# Patient Record
Sex: Female | Born: 1945 | Race: White | Hispanic: No | Marital: Single | Smoking: Never smoker
Health system: Southern US, Community
[De-identification: ages and names within clinical notes are randomized; demographics above are authoritative.]

## PROBLEM LIST (undated history)

## (undated) DIAGNOSIS — R9430 Abnormal result of cardiovascular function study, unspecified: Secondary | ICD-10-CM

## (undated) DIAGNOSIS — I428 Other cardiomyopathies: Secondary | ICD-10-CM

## (undated) DIAGNOSIS — IMO0002 Reserved for concepts with insufficient information to code with codable children: Secondary | ICD-10-CM

## (undated) DIAGNOSIS — D469 Myelodysplastic syndrome, unspecified: Secondary | ICD-10-CM

## (undated) DIAGNOSIS — D539 Nutritional anemia, unspecified: Secondary | ICD-10-CM

## (undated) DIAGNOSIS — E785 Hyperlipidemia, unspecified: Secondary | ICD-10-CM

## (undated) DIAGNOSIS — E039 Hypothyroidism, unspecified: Secondary | ICD-10-CM

## (undated) DIAGNOSIS — Z95810 Presence of automatic (implantable) cardiac defibrillator: Secondary | ICD-10-CM

## (undated) DIAGNOSIS — E663 Overweight: Secondary | ICD-10-CM

## (undated) DIAGNOSIS — K5790 Diverticulosis of intestine, part unspecified, without perforation or abscess without bleeding: Secondary | ICD-10-CM

## (undated) DIAGNOSIS — K219 Gastro-esophageal reflux disease without esophagitis: Secondary | ICD-10-CM

## (undated) DIAGNOSIS — I340 Nonrheumatic mitral (valve) insufficiency: Secondary | ICD-10-CM

## (undated) DIAGNOSIS — I447 Left bundle-branch block, unspecified: Secondary | ICD-10-CM

## (undated) DIAGNOSIS — E611 Iron deficiency: Secondary | ICD-10-CM

## (undated) DIAGNOSIS — H269 Unspecified cataract: Secondary | ICD-10-CM

## (undated) DIAGNOSIS — I5022 Chronic systolic (congestive) heart failure: Secondary | ICD-10-CM

## (undated) DIAGNOSIS — K635 Polyp of colon: Secondary | ICD-10-CM

## (undated) HISTORY — DX: Chronic systolic (congestive) heart failure: I50.22

## (undated) HISTORY — DX: Unspecified cataract: H26.9

## (undated) HISTORY — PX: CARDIAC CATHETERIZATION: SHX172

## (undated) HISTORY — DX: Gastro-esophageal reflux disease without esophagitis: K21.9

## (undated) HISTORY — DX: Hyperlipidemia, unspecified: E78.5

## (undated) HISTORY — DX: Overweight: E66.3

## (undated) HISTORY — DX: Other cardiomyopathies: I42.8

## (undated) HISTORY — PX: CARDIAC DEFIBRILLATOR PLACEMENT: SHX171

## (undated) HISTORY — DX: Left bundle-branch block, unspecified: I44.7

## (undated) HISTORY — DX: Abnormal result of cardiovascular function study, unspecified: R94.30

## (undated) HISTORY — DX: Presence of automatic (implantable) cardiac defibrillator: Z95.810

## (undated) HISTORY — DX: Myelodysplastic syndrome, unspecified: D46.9

## (undated) HISTORY — DX: Diverticulosis of intestine, part unspecified, without perforation or abscess without bleeding: K57.90

## (undated) HISTORY — DX: Hypothyroidism, unspecified: E03.9

## (undated) HISTORY — PX: EYE SURGERY: SHX253

## (undated) HISTORY — DX: Nutritional anemia, unspecified: D53.9

## (undated) HISTORY — DX: Polyp of colon: K63.5

## (undated) HISTORY — PX: OTHER SURGICAL HISTORY: SHX169

## (undated) HISTORY — DX: Iron deficiency: E61.1

## (undated) HISTORY — DX: Nonrheumatic mitral (valve) insufficiency: I34.0

## (undated) HISTORY — DX: Reserved for concepts with insufficient information to code with codable children: IMO0002

---
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
## 1997-11-05 ENCOUNTER — Encounter: Payer: Self-pay | Admitting: Cardiology

## 1998-12-08 ENCOUNTER — Other Ambulatory Visit: Admission: RE | Admit: 1998-12-08 | Discharge: 1998-12-08 | Payer: Self-pay | Admitting: Obstetrics and Gynecology

## 2000-01-04 ENCOUNTER — Other Ambulatory Visit: Admission: RE | Admit: 2000-01-04 | Discharge: 2000-01-04 | Payer: Self-pay | Admitting: Obstetrics and Gynecology

## 2000-07-06 ENCOUNTER — Encounter: Admission: RE | Admit: 2000-07-06 | Discharge: 2000-07-06 | Payer: Self-pay | Admitting: Internal Medicine

## 2000-07-06 ENCOUNTER — Encounter: Payer: Self-pay | Admitting: Internal Medicine

## 2001-04-20 ENCOUNTER — Other Ambulatory Visit: Admission: RE | Admit: 2001-04-20 | Discharge: 2001-04-20 | Payer: Self-pay | Admitting: Obstetrics and Gynecology

## 2001-04-24 ENCOUNTER — Encounter: Admission: RE | Admit: 2001-04-24 | Discharge: 2001-04-24 | Payer: Self-pay | Admitting: Obstetrics and Gynecology

## 2001-04-24 ENCOUNTER — Encounter: Payer: Self-pay | Admitting: Obstetrics and Gynecology

## 2003-08-16 ENCOUNTER — Encounter: Admission: RE | Admit: 2003-08-16 | Discharge: 2003-08-16 | Payer: Self-pay | Admitting: Obstetrics and Gynecology

## 2004-06-18 ENCOUNTER — Other Ambulatory Visit: Admission: RE | Admit: 2004-06-18 | Discharge: 2004-06-18 | Payer: Self-pay | Admitting: Obstetrics and Gynecology

## 2005-07-01 ENCOUNTER — Other Ambulatory Visit: Admission: RE | Admit: 2005-07-01 | Discharge: 2005-07-01 | Payer: Self-pay | Admitting: Obstetrics and Gynecology

## 2006-06-20 ENCOUNTER — Encounter: Admission: RE | Admit: 2006-06-20 | Discharge: 2006-09-18 | Payer: Self-pay | Admitting: Internal Medicine

## 2007-03-28 ENCOUNTER — Encounter: Admission: RE | Admit: 2007-03-28 | Discharge: 2007-03-28 | Payer: Self-pay | Admitting: Internal Medicine

## 2008-05-13 ENCOUNTER — Encounter: Admission: RE | Admit: 2008-05-13 | Discharge: 2008-05-13 | Payer: Self-pay | Admitting: Internal Medicine

## 2008-05-13 ENCOUNTER — Ambulatory Visit: Payer: Self-pay | Admitting: Cardiology

## 2008-05-13 ENCOUNTER — Inpatient Hospital Stay (HOSPITAL_COMMUNITY): Admission: AD | Admit: 2008-05-13 | Discharge: 2008-05-16 | Payer: Self-pay | Admitting: Internal Medicine

## 2008-05-14 ENCOUNTER — Encounter: Payer: Self-pay | Admitting: Internal Medicine

## 2008-05-22 ENCOUNTER — Ambulatory Visit: Payer: Self-pay | Admitting: Cardiology

## 2008-05-22 LAB — CONVERTED CEMR LAB
CO2: 31 meq/L (ref 19–32)
Calcium: 9.2 mg/dL (ref 8.4–10.5)
Potassium: 4.3 meq/L (ref 3.5–5.1)
Sodium: 142 meq/L (ref 135–145)

## 2008-05-24 ENCOUNTER — Ambulatory Visit: Payer: Self-pay | Admitting: Cardiology

## 2008-06-06 ENCOUNTER — Ambulatory Visit: Payer: Self-pay | Admitting: Cardiology

## 2008-06-24 ENCOUNTER — Ambulatory Visit: Payer: Self-pay | Admitting: Cardiology

## 2008-07-18 ENCOUNTER — Ambulatory Visit: Payer: Self-pay | Admitting: Cardiology

## 2008-08-20 ENCOUNTER — Ambulatory Visit: Payer: Self-pay | Admitting: Internal Medicine

## 2008-08-26 ENCOUNTER — Ambulatory Visit: Payer: Self-pay

## 2008-08-26 ENCOUNTER — Encounter: Payer: Self-pay | Admitting: Cardiology

## 2008-09-02 ENCOUNTER — Ambulatory Visit: Payer: Self-pay | Admitting: Cardiology

## 2008-09-23 ENCOUNTER — Ambulatory Visit: Payer: Self-pay | Admitting: Internal Medicine

## 2008-10-09 ENCOUNTER — Ambulatory Visit: Payer: Self-pay | Admitting: Cardiology

## 2008-10-22 ENCOUNTER — Ambulatory Visit: Payer: Self-pay | Admitting: Cardiology

## 2008-11-19 ENCOUNTER — Ambulatory Visit: Payer: Self-pay | Admitting: Internal Medicine

## 2008-12-02 ENCOUNTER — Emergency Department (HOSPITAL_COMMUNITY): Admission: EM | Admit: 2008-12-02 | Discharge: 2008-12-02 | Payer: Self-pay | Admitting: Emergency Medicine

## 2008-12-02 ENCOUNTER — Ambulatory Visit: Payer: Self-pay | Admitting: Internal Medicine

## 2008-12-11 ENCOUNTER — Ambulatory Visit: Payer: Self-pay | Admitting: Cardiology

## 2008-12-11 ENCOUNTER — Encounter: Payer: Self-pay | Admitting: Cardiology

## 2009-01-24 ENCOUNTER — Ambulatory Visit: Payer: Self-pay | Admitting: Cardiology

## 2009-01-28 ENCOUNTER — Telehealth: Payer: Self-pay | Admitting: Cardiology

## 2009-02-18 ENCOUNTER — Ambulatory Visit: Payer: Self-pay | Admitting: Cardiology

## 2009-02-18 LAB — CONVERTED CEMR LAB
Chloride: 104 meq/L (ref 96–112)
GFR calc non Af Amer: 107.42 mL/min (ref >60)
Glucose, Bld: 95 mg/dL (ref 70–99)
Potassium: 3.9 meq/L (ref 3.5–5.1)
Sodium: 141 meq/L (ref 135–145)

## 2009-02-25 ENCOUNTER — Ambulatory Visit: Payer: Self-pay | Admitting: Cardiology

## 2009-02-27 LAB — CONVERTED CEMR LAB
CO2: 33 meq/L — ABNORMAL HIGH (ref 19–32)
Chloride: 103 meq/L (ref 96–112)
Creatinine, Ser: 0.9 mg/dL (ref 0.4–1.2)
Sodium: 140 meq/L (ref 135–145)

## 2009-03-17 ENCOUNTER — Encounter: Payer: Self-pay | Admitting: Cardiology

## 2009-03-18 ENCOUNTER — Ambulatory Visit: Payer: Self-pay | Admitting: Cardiology

## 2009-03-27 ENCOUNTER — Encounter: Payer: Self-pay | Admitting: Cardiology

## 2009-04-01 ENCOUNTER — Ambulatory Visit: Payer: Self-pay | Admitting: Cardiology

## 2009-04-02 LAB — CONVERTED CEMR LAB
BUN: 18 mg/dL (ref 6–23)
Chloride: 99 meq/L (ref 96–112)
Glucose, Bld: 92 mg/dL (ref 70–99)
Potassium: 4.4 meq/L (ref 3.5–5.1)

## 2009-05-13 ENCOUNTER — Encounter: Payer: Self-pay | Admitting: Cardiology

## 2009-05-14 ENCOUNTER — Ambulatory Visit: Payer: Self-pay | Admitting: Cardiology

## 2009-05-22 ENCOUNTER — Ambulatory Visit: Payer: Self-pay | Admitting: Internal Medicine

## 2009-06-04 ENCOUNTER — Encounter: Payer: Self-pay | Admitting: Cardiology

## 2009-06-04 ENCOUNTER — Ambulatory Visit: Payer: Self-pay

## 2009-06-16 ENCOUNTER — Encounter: Payer: Self-pay | Admitting: Cardiology

## 2009-06-17 ENCOUNTER — Ambulatory Visit: Payer: Self-pay | Admitting: Cardiology

## 2009-09-19 ENCOUNTER — Ambulatory Visit: Payer: Self-pay | Admitting: Cardiology

## 2009-10-13 ENCOUNTER — Ambulatory Visit: Payer: Self-pay | Admitting: Internal Medicine

## 2009-10-27 ENCOUNTER — Ambulatory Visit: Payer: Self-pay | Admitting: Internal Medicine

## 2009-10-29 LAB — CONVERTED CEMR LAB
BUN: 22 mg/dL (ref 6–23)
Creatinine, Ser: 0.7 mg/dL (ref 0.4–1.2)
Eosinophils Relative: 5.4 % — ABNORMAL HIGH (ref 0.0–5.0)
GFR calc non Af Amer: 89.71 mL/min (ref >60)
MCV: 99.7 fL (ref 78.0–100.0)
Monocytes Absolute: 0.4 10*3/uL (ref 0.1–1.0)
Monocytes Relative: 7.6 % (ref 3.0–12.0)
Neutrophils Relative %: 39.7 % — ABNORMAL LOW (ref 43.0–77.0)
Platelets: 254 10*3/uL (ref 150.0–400.0)
Prothrombin Time: 10.8 s (ref 9.1–11.7)
WBC: 5.4 10*3/uL (ref 4.5–10.5)
aPTT: 26.5 s (ref 21.7–28.8)

## 2009-11-03 ENCOUNTER — Ambulatory Visit: Payer: Self-pay | Admitting: Internal Medicine

## 2009-11-03 ENCOUNTER — Ambulatory Visit (HOSPITAL_COMMUNITY): Admission: RE | Admit: 2009-11-03 | Discharge: 2009-11-04 | Payer: Self-pay | Admitting: Internal Medicine

## 2009-11-04 ENCOUNTER — Encounter: Payer: Self-pay | Admitting: Internal Medicine

## 2009-11-06 ENCOUNTER — Encounter: Payer: Self-pay | Admitting: Internal Medicine

## 2009-11-12 ENCOUNTER — Encounter: Payer: Self-pay | Admitting: Cardiology

## 2009-11-17 ENCOUNTER — Ambulatory Visit: Payer: Self-pay

## 2009-11-17 ENCOUNTER — Encounter: Payer: Self-pay | Admitting: Internal Medicine

## 2009-11-17 ENCOUNTER — Ambulatory Visit: Payer: Self-pay | Admitting: Internal Medicine

## 2009-11-20 ENCOUNTER — Ambulatory Visit: Payer: Self-pay | Admitting: Internal Medicine

## 2009-11-20 ENCOUNTER — Telehealth: Payer: Self-pay | Admitting: Cardiology

## 2009-12-22 ENCOUNTER — Encounter: Payer: Self-pay | Admitting: Cardiology

## 2009-12-24 ENCOUNTER — Emergency Department (HOSPITAL_COMMUNITY): Admission: EM | Admit: 2009-12-24 | Discharge: 2009-12-24 | Payer: Self-pay | Admitting: Family Medicine

## 2010-02-03 ENCOUNTER — Ambulatory Visit: Payer: Self-pay | Admitting: Internal Medicine

## 2010-03-17 ENCOUNTER — Ambulatory Visit: Payer: Self-pay | Admitting: Cardiology

## 2010-03-20 ENCOUNTER — Ambulatory Visit: Payer: Self-pay | Admitting: Internal Medicine

## 2010-05-07 ENCOUNTER — Ambulatory Visit: Payer: Self-pay | Admitting: Internal Medicine

## 2010-06-18 ENCOUNTER — Ambulatory Visit: Payer: Self-pay | Admitting: Internal Medicine

## 2010-07-22 ENCOUNTER — Ambulatory Visit: Payer: Self-pay

## 2010-07-22 ENCOUNTER — Ambulatory Visit: Payer: Self-pay | Admitting: Internal Medicine

## 2010-07-22 ENCOUNTER — Encounter: Payer: Self-pay | Admitting: Internal Medicine

## 2010-09-17 ENCOUNTER — Ambulatory Visit
Admission: RE | Admit: 2010-09-17 | Discharge: 2010-09-17 | Payer: Self-pay | Source: Home / Self Care | Attending: Cardiology | Admitting: Cardiology

## 2010-09-17 ENCOUNTER — Encounter: Payer: Self-pay | Admitting: Cardiology

## 2010-10-08 ENCOUNTER — Ambulatory Visit
Admission: RE | Admit: 2010-10-08 | Discharge: 2010-10-08 | Payer: Self-pay | Source: Home / Self Care | Attending: Internal Medicine | Admitting: Internal Medicine

## 2010-10-08 ENCOUNTER — Other Ambulatory Visit: Payer: Self-pay | Admitting: Internal Medicine

## 2010-10-08 DIAGNOSIS — Z1239 Encounter for other screening for malignant neoplasm of breast: Secondary | ICD-10-CM

## 2010-10-11 LAB — CONVERTED CEMR LAB
CO2: 31 meq/L (ref 19–32)
Calcium: 9.7 mg/dL (ref 8.4–10.5)
Creatinine, Ser: 0.8 mg/dL (ref 0.4–1.2)
Glucose, Bld: 99 mg/dL (ref 70–99)

## 2010-10-15 NOTE — Assessment & Plan Note (Signed)
Summary: per check out/sf   Visit Type:  Follow-up Primary Provider:  Sharlet Salina, MD  CC:  cardiomyopathy.  History of Present Illness: The patient returns today for followup of cardiomyopathy.  She has not had chest pain.  She has shortness of breath with higher levels of exertion.  She is feeling somewhat sluggish.  This is related to the fact that she has gained 10 pounds since being seen here last.  She is aware of this and she has begun to try to lose this weight.  She has not had syncope or presyncope.  She brings copies of her blood pressures to me.  Her systolic remains in the range of 100.  Her pulse remains in the range of 65-70.  Current Medications (verified): 1)  Protonix 40 Mg Tbec (Pantoprazole Sodium) .Marland Kitchen.. 1 Once Daily 2)  Altace 2.5 Mg Tabs (Ramipril) .Marland Kitchen.. 1 Once Daily 3)  Celexa 40 Mg Tabs (Citalopram Hydrobromide) .Marland Kitchen.. 1 Once Daily 4)  Aspirin 81 Mg  Tbec (Aspirin) .... One By Mouth Every Day 5)  Lasix 40 Mg Tabs (Furosemide) .Marland Kitchen.. 1 Once Daily 6)  Vitamin D .... Once Daily 7)  Synthroid 100 Mcg Tabs (Levothyroxine Sodium) .Marland Kitchen.. 1 Once Daily 8)  Carvedilol 25 Mg Tabs (Carvedilol) .... Take One Tablet By Mouth Twice A Day 9)  Tylenol Sinus .... Prn 10)  Ibuprofen Ib 200 Mg Tabs (Ibuprofen) .... As Needed 11)  Spironolactone 25 Mg Tabs (Spironolactone) .... Take One Tablet By Mouth Daily  Allergies (verified): No Known Drug Allergies  Past History:  Past Medical History: Nonischemic cardiomyopathy Chronic systolic congestive heart failure Normal coronary arteries by catheter September 2009 Old left bundle branch block Biventricular ICD he recommended by Dr. Ladona Ridgel... January, 2010 Hypothyroidism GERD Dyslipidemia Borderline diabetes Ejection fraction 25-30% .Marland KitchenMarland Kitchen2009 / echocardiogram September, 2010... EF 25-30% diffuse hypokinesis Mitral regurgitation...mild.... echo.... September, 2010  Review of Systems       Patient denies fever, chills, headache,  sweats, rash, change in vision, change in hearing, chest pain, cough, nausea vomiting, urinary symptoms.  Vital Signs:  Patient profile:   65 year old female Height:      60 inches Weight:      176 pounds BMI:     34.50 Pulse rate:   68 / minute BP sitting:   132 / 78  (left arm) Cuff size:   regular  Vitals Entered By: Hardin Negus, RMA (September 19, 2009 1:50 PM)  Physical Exam  General:  in general she is stable today but she has gained weight. Head:  head is atraumatic. Eyes:  no xanthelasma. Neck:  no jugular venous distention. Chest Wall:  no chest wall tenderness. Lungs:  lungs are clear.  Respiratory effort is nonlabored. Heart:  cardiac exam reveals an S1-S2.  No clicks or significant murmurs. Abdomen:  abdomen is soft. Msk:  no musculoskeletal deformities. Extremities:  trace peripheral edema. Skin:  no skin rashes. Psych:  patient is oriented to person time and place.  Affect is normal.   Impression & Recommendations:  Problem # 1:  MITRAL REGURGITATION (ICD-396.3) the patient has mitral regurgitation.  It has been mild by echo.  No change in therapy.  Problem # 2:  CHRONIC SYSTOLIC HEART FAILURE (ICD-428.22)  Her updated medication list for this problem includes:    Altace 2.5 Mg Tabs (Ramipril) .Marland Kitchen... 1 once daily    Aspirin 81 Mg Tbec (Aspirin) ..... One by mouth every day    Lasix 40 Mg Tabs (  Furosemide) .Marland Kitchen... 1 once daily    Carvedilol 25 Mg Tabs (Carvedilol) .Marland Kitchen... Take one tablet by mouth twice a day    Spironolactone 25 Mg Tabs (Spironolactone) .Marland Kitchen... Take one tablet by mouth daily  The patient's heart failure is controlled today.  I am not able to titrate her meds any further.  Chemistry will be checked again today to be sure there potassium is stable and she is on spironolactone.  She is also on Lasix.  Problem # 3:  HYPOTHYROIDISM, HX OF (ICD-V12.2) thyroid status is treated.  Problem # 4:  CARDIOMYOPATHY (ICD-425.4)  The patient's LV function  was rechecked in September 2010.  She has continued severe LV dysfunction.  ICD is still recommended.  I will have her see Dr. Ladona Ridgel in the office for further review and scheduling.  It is of note that in the past the patient has had a left bundle branch block on multiple occasions.  EKG is done today and reviewed by me.  Her QRS is narrow today.  This needs to be very carefully consider concerning the type of device that will be placed.I have repeated EKG.  It is the same as the first tracing with a QRS rate of 96 ms.  Orders: TLB-BMP (Basic Metabolic Panel-BMET) (80048-METABOL)  Problem # 5:  LEFT BUNDLE BRANCH BLOCK (ICD-426.3)  Her updated medication list for this problem includes:    Altace 2.5 Mg Tabs (Ramipril) .Marland Kitchen... 1 once daily    Aspirin 81 Mg Tbec (Aspirin) ..... One by mouth every day    Carvedilol 25 Mg Tabs (Carvedilol) .Marland Kitchen... Take one tablet by mouth twice a day  Orders: EKG w/ Interpretation (93000) Overtime on multiple occasions the patient has had left bundle branch block.  EKG today shows a narrow QRS.  The EKG will be repeated.The repeat EKG reveals a narrow QRS.  Patient Instructions: 1)  Need appointment with Dr Ladona Ridgel for 1 year follow up 2)  Follow up with Dr Myrtis Ser in 6 months

## 2010-10-15 NOTE — Assessment & Plan Note (Signed)
Summary: 1 YR F/U   Primary Provider:  Sharlet Salina, MD   History of Present Illness: [name] isre- referred today by Dr. Willa Rough.  She is a very pleasant 65 year old woman who has developed a nonischemic cardiomyopathy with congestive heart failure.  Her symptoms have been present for many months, but she was actually diagnosed back in August 2009 at which time she presented with worsening congestive heart failure.  At that time, she was diuresed.  She underwent catheterization where she was found to have severe LV dysfunction, an EF of 30%, and normal coronary arteries.  The patient has been treated with maximal medical therapy under the direction of Dr. Myrtis Ser.  Despite this, she continues to experience heart failure symptoms.  Her symptoms varied from day to day between class II and class III.  At that time, she has fairly good energy and at other time she feels fatigued and tired.  Her symptoms seem to be more related to low output state rather than breathlessness and pulmonary edema. She had an ECG several weeks ago and her LBBB was resolved.  She has had no syncope.  Current Medications (verified): 1)  Prevacid 30 Mg Cpdr (Lansoprazole) .... Take One Tablet By Mouth Once Daily. 2)  Altace 2.5 Mg Tabs (Ramipril) .Marland Kitchen.. 1 Once Daily 3)  Celexa 40 Mg Tabs (Citalopram Hydrobromide) .Marland Kitchen.. 1 Once Daily 4)  Aspirin 81 Mg  Tbec (Aspirin) .... One By Mouth Every Day 5)  Lasix 40 Mg Tabs (Furosemide) .Marland Kitchen.. 1 Once Daily 6)  Vitamin D .... Once Daily 7)  Synthroid 100 Mcg Tabs (Levothyroxine Sodium) .Marland Kitchen.. 1 Once Daily 8)  Carvedilol 25 Mg Tabs (Carvedilol) .... Take One Tablet By Mouth Twice A Day 9)  Tylenol Sinus .... Prn 10)  Ibuprofen Ib 200 Mg Tabs (Ibuprofen) .... As Needed 11)  Spironolactone 25 Mg Tabs (Spironolactone) .... Take One Tablet By Mouth Daily  Allergies (verified): No Known Drug Allergies  Past History:  Past Medical History: Last updated: 09/19/2009 Nonischemic  cardiomyopathy Chronic systolic congestive heart failure Normal coronary arteries by catheter September 2009 Old left bundle branch block Biventricular ICD he recommended by Dr. Ladona Ridgel... January, 2010 Hypothyroidism GERD Dyslipidemia Borderline diabetes Ejection fraction 25-30% .Marland KitchenMarland Kitchen2009 / echocardiogram September, 2010... EF 25-30% diffuse hypokinesis Mitral regurgitation...mild.... echo.... September, 2010  Review of Systems       The patient complains of dyspnea on exertion.  The patient denies chest pain, syncope, and peripheral edema.    Vital Signs:  Patient profile:   65 year old female Height:      60 inches Weight:      175 pounds BMI:     34.30 Pulse rate:   76 / minute BP sitting:   104 / 70  (left arm)  Vitals Entered By: Laurance Flatten CMA (October 13, 2009 2:24 PM)  Physical Exam  General:  in general she is stable today but she has gained weight. Head:  head is atraumatic. Eyes:  no xanthelasma. Neck:  no jugular venous distention. Chest Wall:  no chest wall tenderness. Lungs:  lungs are clear.  Respiratory effort is nonlabored. Heart:  cardiac exam reveals an S1-S2.  No clicks or significant murmurs. Abdomen:  abdomen is soft. Msk:  no musculoskeletal deformities. Pulses:  pulses normal in all 4 extremities Extremities:  trace peripheral edema. Neurologic:  Alert and oriented x 3.   Impression & Recommendations:  Problem # 1:  CHRONIC SYSTOLIC HEART FAILURE (ICD-428.22) Her symptoms remain class  2 despite maximal medical therapy.  She had a repeat 2D echo which demonstrated and EF of 30%. Her updated medication list for this problem includes:    Altace 2.5 Mg Tabs (Ramipril) .Marland Kitchen... 1 once daily    Aspirin 81 Mg Tbec (Aspirin) ..... One by mouth every day    Lasix 40 Mg Tabs (Furosemide) .Marland Kitchen... 1 once daily    Carvedilol 25 Mg Tabs (Carvedilol) .Marland Kitchen... Take one tablet by mouth twice a day    Spironolactone 25 Mg Tabs (Spironolactone) .Marland Kitchen... Take one tablet by  mouth daily  Problem # 2:  LEFT BUNDLE BRANCH BLOCK (ICD-426.3) Her LBBB has resolved.  I will repeat prior to her ICD implant with the plan to place a single chamber device if the QRS is less than 150 ms. Her updated medication list for this problem includes:    Altace 2.5 Mg Tabs (Ramipril) .Marland Kitchen... 1 once daily    Aspirin 81 Mg Tbec (Aspirin) ..... One by mouth every day    Carvedilol 25 Mg Tabs (Carvedilol) .Marland Kitchen... Take one tablet by mouth twice a day   Patient Instructions: 1)  Your physician has recommended that you have a defibrillator inserted.  An implantable cardioverter defibrillator (ICD) is a small device that is placed in your chest or, in rare cases, your abdomen. This device uses electrical pulses or shocks to help control life-threatening, irregular heartbeats that could lead the heart to suddenly stop beating (sudden cardiac arrest). Leads are attached to the ICD that goes into your heart. This is done in the hospital and usually requires an overnight stay. Please see the instruction sheet given to you today for more information.

## 2010-10-15 NOTE — Cardiovascular Report (Signed)
Summary: Office Visit   Office Visit   Imported By: Roderic Ovens 08/03/2010 15:41:50  _____________________________________________________________________  External Attachment:    Type:   Image     Comment:   External Document

## 2010-10-15 NOTE — Letter (Signed)
Summary: Implantable Device Instructions  Architectural technologist, Main Office  [address]   [place], [place] [address]   Phone: [phone]  Fax: [phone]      Implantable Device Instructions  You are scheduled for:    Implantable Cardioverter Defibrillator   on 11/03/09 with Dr. Ladona Ridgel  1.  Please arrive at the Short Stay Center at [facility] at 6:00am on the day of your procedure.  2.  Do not eat or drink the night before your procedure.  3.  Complete lab work on 2/14/11at 2:30pm The lab at [facility] is open from 8:30 AM to 1:30 PM and from 2:30 PM to 5:00 PM. You do not have to be fasting.  4.  Do NOT take these medications for the days of your procedure:  Furosemide   5.  Plan for an overnight stay.  Bring your insurance cards and a list of your medications.  6.  Wash your chest and neck with antibacterial soap (any brand) the evening before and the morning of your procedure.  Rinse well.  7.  Education material received:               ICD          *If you have ANY questions after you get home, please call the office [phone]. Anselm Pancoast  *Every attempt is made to prevent procedures from being rescheduled.  Due to the nauture of Electrophysiology, rescheduling can happen.  The physician is always aware and directs the staff when this occurs.

## 2010-10-15 NOTE — Miscellaneous (Signed)
Summary: Device preload  Clinical Lists Changes  Observations: Added new observation of ICD INDICATN: NICM (11/06/2009 16:09) Added new observation of ICDLEADSTAT2: active (11/06/2009 16:09) Added new observation of ICDLEADSER2: [id number]  (11/06/2009 16:09) Added new observation of ICDLEADMOD2: 0157  (11/06/2009 16:09) Added new observation of ICDLEADLOC2: RV  (11/06/2009 16:09) Added new observation of ICDLEADSTAT1: active  (11/06/2009 16:09) Added new observation of ICDLEADSER1: [id number]  (11/06/2009 16:09) Added new observation of ICDLEADMOD1: 5076  (11/06/2009 16:09) Added new observation of ICDLEADLOC1: RA  (11/06/2009 16:09) Added new observation of ICD IMP MD: Lewayne Bunting, MD  (11/06/2009 16:09) Added new observation of ICDLEADDOI2: 11/03/2009  (11/06/2009 16:09) Added new observation of ICDLEADDOI1: 11/03/2009  (11/06/2009 16:09) Added new observation of ICD IMPL DTE: 11/03/2009  (11/06/2009 16:09) Added new observation of ICD SERL#: [id number]  (11/06/2009 16:09) Added new observation of ICD MODL#: TELOGEN 100  (11/06/2009 16:09) Added new observation of ICDMANUFACTR: [employer]  (11/06/2009 16:09) Added new observation of ICD MD: Lewayne Bunting, MD  (11/06/2009 16:09)       ICD Specifications Following MD:  Lewayne Bunting, MD     ICD Vendor:  [facility] [employer]     ICD Model Number:  TELOGEN 100     ICD Serial Number:  [id number] ICD DOI:  11/03/2009     ICD Implanting MD:  Lewayne Bunting, MD  Lead 1:    Location: RA     DOI: 11/03/2009     Model #: 2952     Serial #: [id number]     Status: active Lead 2:    Location: RV     DOI: 11/03/2009     Model #: 0157     Serial #: [id number]     Status: active  Indications::  NICM

## 2010-10-15 NOTE — Progress Notes (Signed)
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  Discussed info w/Dr Myrtis Ser he recommends having pt wait and take am carvediolol around 7am.  Also received labwork on pt and per Dr Myrtis Ser she needs a repeat tsh in 4 weeks.  Have called pt and Left message to call back   Follow-up for Phone Call        returning call [phone], Migdalia Dk  November 24, 2009 8:34 AM  lmtcb Scherrie Bateman, LPN  November 24, 2009 8:39 AM  [facility] Scherrie Bateman, LPN  November 24, 2009 1:12 PM  retuning call, Migdalia Dk  November 24, 2009 2:32 PM  LM to [facility]  Sander Nephew, RN  Additional Follow-up for Phone Call Additional follow up Details #1::        Left message to call back Meredith Staggers, RN  November 26, 2009 5:48 PM   returning call [phone], Migdalia Dk  November 27, 2009 8:47 AM     Additional Follow-up for Phone Call Additional follow up Details #2::    spoke w/pt she will change am carvedilol to 7am Meredith Staggers, RN  November 28, 2009 8:30 AM

## 2010-10-15 NOTE — Procedures (Signed)
Summary: icd check/[employer]   Allergies: No Known Drug Allergies   ICD Specifications Following MD:  Lewayne Bunting, MD     ICD Vendor:  [facility] [employer]     ICD Model Number:  TELOGEN 100     ICD Serial Number:  [id number] ICD DOI:  11/03/2009     ICD Implanting MD:  Lewayne Bunting, MD Research Study Name MADIT-RIT  Lead 1:    Location: RA     DOI: 11/03/2009     Model #: 0454     Serial #: [id number]     Status: active Lead 2:    Location: RV     DOI: 11/03/2009     Model #: 2956     Serial #: [id number]     Status: active  Indications::  NICM   ICD Follow Up Remote Check?  No Charge Time:  8.5 seconds     Battery Est. Longevity:  8.5 years Underlying rhythm:  SR ICD Dependent:  No       ICD Device Measurements Atrium:  Amplitude: 6.9 mV, Impedance: 644 ohms, Threshold: 0.8 V at 0.4 msec Right Ventricle:  Amplitude: 17.9 mV, Impedance: 526 ohms, Threshold: 0.6 V at 0.4 msec Shock Impedance: 50 ohms   Episodes Shock:  0     ATP:  0     Nonsustained:  2     Atrial Pacing:  <1%     Ventricular Pacing:  <1%  Brady Parameters Mode DDI     Lower Rate Limit:  40     PAV 350      Tachy Zones VF:  250     VT:  200     VT1:  170     Tech Comments:  Device checked by industry for research. Altha Harm, LPN  May 07, 2010 3:42 PM

## 2010-10-15 NOTE — Procedures (Signed)
Summary: device ck/mt   Current Medications (verified): 1)  Altace 2.5 Mg Tabs (Ramipril) .Marland Kitchen.. 1 Once Daily 2)  Celexa 40 Mg Tabs (Citalopram Hydrobromide) .Marland Kitchen.. 1 Once Daily 3)  Aspirin 81 Mg  Tbec (Aspirin) .... One By Mouth Every Day 4)  Lasix 40 Mg Tabs (Furosemide) .Marland Kitchen.. 1 Once Daily 5)  Vitamin D .... Once Daily 6)  Synthroid 112 Mcg Tabs (Levothyroxine Sodium) .... Once Daily 7)  Carvedilol 25 Mg Tabs (Carvedilol) .... Take One Tablet By Mouth Twice A Day 8)  Tylenol Sinus .... Prn 9)  Ibuprofen Ib 200 Mg Tabs (Ibuprofen) .... As Needed 10)  Spironolactone 25 Mg Tabs (Spironolactone) .... Take One Tablet By Mouth Daily 11)  Omeprazole 20 Mg Cpdr (Omeprazole) .... One By Mouth Daily 12)  Metformin Hcl 500 Mg Tabs (Metformin Hcl) .... One By Mouth Two Times A Day 13)  Crestor 10 Mg Tabs (Rosuvastatin Calcium) .... One By Mouth Daily  Allergies (verified): No Known Drug Allergies   ICD Specifications Following MD:  Lewayne Bunting, MD     ICD Vendor:  [facility] [employer]     ICD Model Number:  TELOGEN 100     ICD Serial Number:  [id number] ICD DOI:  11/03/2009     ICD Implanting MD:  Lewayne Bunting, MD Research Study Name MADIT-RIT  Lead 1:    Location: RA     DOI: 11/03/2009     Model #: 8119     Serial #: [id number]     Status: active Lead 2:    Location: RV     DOI: 11/03/2009     Model #: 3086     Serial #: [id number]     Status: active  Indications::  NICM   ICD Follow Up Remote Check?  No Charge Time:  8.5 seconds     Battery Est. Longevity:  8.5 years Underlying rhythm:  SR ICD Dependent:  No       ICD Device Measurements Atrium:  Amplitude: 5.9 mV, Impedance: 710 ohms, Threshold: 0.8 V at 0.4 msec Right Ventricle:  Amplitude: 18.3 mV, Impedance: 546 ohms, Threshold: 0.4 V at 0.4 msec Shock Impedance: 61 ohms   Episodes MS Episodes:  0     Percent Mode Switch:  0     Coumadin:  No Shock:  0     ATP:  0     Nonsustained:  2     Atrial Pacing:  0%     Ventricular Pacing:   0%  Brady Parameters Mode DDI     Lower Rate Limit:  40     PAV 350      Tachy Zones VF:  250     VT:  200     VT1:  170     Next Cardiology Appt Due:  10/14/2010 Tech Comments:  2 NSVT episodes 1:1 conduction.  Checked by Media planner for research.  No parameter changes.  Device function normal.  ROV 3 months clinic. Altha Harm, LPN  July 22, 2010 11:47 AM

## 2010-10-15 NOTE — Cardiovascular Report (Signed)
Summary: Pre Op Orders  Pre Op Orders   Imported By: Roderic Ovens 10/14/2009 16:13:30  _____________________________________________________________________  External Attachment:    Type:   Image     Comment:   External Document

## 2010-10-15 NOTE — Procedures (Signed)
Summary: wound check   Allergies: No Known Drug Allergies   ICD Specifications Following MD:  Lewayne Bunting, MD     ICD Vendor:  [facility] [employer]     ICD Model Number:  TELOGEN 100     ICD Serial Number:  [id number] ICD DOI:  11/03/2009     ICD Implanting MD:  Lewayne Bunting, MD Research Study Name MADIT-RIT  Lead 1:    Location: RA     DOI: 11/03/2009     Model #: 9811     Serial #: [id number]     Status: active Lead 2:    Location: RV     DOI: 11/03/2009     Model #: 1308     Serial #: [id number]     Status: active  Indications::  NICM   ICD Follow Up Remote Check?  No Battery Voltage:  BOL V     Charge Time:  8.1 seconds     Battery Est. Longevity:  8.5 YEARS Underlying rhythm:  SR ICD Dependent:  No       ICD Device Measurements Atrium:  Amplitude: 6.0 mV, Impedance: 550 ohms, Threshold: 0.6 V at 0.4 msec Right Ventricle:  Amplitude: 7.0 mV, Impedance: 595 ohms, Threshold: 0.8 V at 0.4 msec Shock Impedance: 59 ohms   Episodes Shock:  0     ATP:  0     Nonsustained:  0     Atrial Pacing:  <1%     Ventricular Pacing:  <1%  Brady Parameters Mode DDI     Lower Rate Limit:  40     PAV 350      Tachy Zones VF:  250     VT:  200     VT1:  170     Next Cardiology Appt Due:  02/11/2010 Tech Comments:  Wound check appt.  Steri-strips removed.  Wound without redness or edema.  Device checked by industry as part of MADIT-RIT protocol.  ROV 3 months GT. Gypsy Balsam RN BSN  November 17, 2009 9:28 AM  MD Comments:  Agree with above.

## 2010-10-15 NOTE — Assessment & Plan Note (Signed)
Summary: [name]   Visit Type:  Follow-up Primary Provider:  Sharlet Salina, MD  CC:  cardiomyopathy.  History of Present Illness: The patient is seen for followup for nonischemic cardiomyopathy.  In general she is doing relatively well.  I saw her last in the office in July, 2011.  She says that she may have mild increase in shortness of breath.  She thinks is probably related to her overall weight gain that does not appear to be volume related.  She has gained weight over the holidays.  She does have a plan in place to begin to try to lose weight.  She is not having any chest pain.  There's been no syncope or presyncope.  Current Medications (verified): 1)  Altace 2.5 Mg Tabs (Ramipril) .Marland Kitchen.. 1 Once Daily 2)  Celexa 40 Mg Tabs (Citalopram Hydrobromide) .Marland Kitchen.. 1 Once Daily 3)  Aspirin 81 Mg  Tbec (Aspirin) .... One By Mouth Every Day 4)  Lasix 40 Mg Tabs (Furosemide) .Marland Kitchen.. 1 Once Daily 5)  Vitamin D .... Once Daily 6)  Synthroid 112 Mcg Tabs (Levothyroxine Sodium) .... Once Daily 7)  Carvedilol 25 Mg Tabs (Carvedilol) .... Take One Tablet By Mouth Twice A Day 8)  Tylenol Sinus .... Prn 9)  Ibuprofen Ib 200 Mg Tabs (Ibuprofen) .... As Needed 10)  Spironolactone 25 Mg Tabs (Spironolactone) .... Take One Tablet By Mouth Daily 11)  Omeprazole 20 Mg Cpdr (Omeprazole) .... One By Mouth Daily 12)  Metformin Hcl 500 Mg Tabs (Metformin Hcl) .... One By Mouth Two Times A Day 13)  Crestor 10 Mg Tabs (Rosuvastatin Calcium) .... One By Mouth Daily  Allergies (verified): No Known Drug Allergies  Past History:  Past Medical History: Nonischemic cardiomyopathy Chronic systolic congestive heart failure Normal coronary arteries by catheter September 2009 Old left bundle branch block ICD placed... February, 2011... Dr. Ladona Ridgel Hypothyroidism GERD Dyslipidemia Borderline diabetes Ejection fraction 25-30% .Marland KitchenMarland Kitchen2009 / echocardiogram September, 2010... EF 25-30% diffuse hypokinesis Mitral  regurgitation...mild.... echo.... September, 2010 Overweight  Review of Systems       Patient denies fever, chills, headache, sweats, rash, change in vision, change in hearing, chest pain, cough, nausea vomiting, urinary symptoms.  All other systems are reviewed and are negative.  Vital Signs:  Patient profile:   65 year old female Height:      60 inches Weight:      188 pounds BMI:     36.85 Pulse rate:   71 / minute BP sitting:   118 / 72  (left arm) Cuff size:   regular  Vitals Entered By: Hardin Negus, RMA (September 17, 2010 2:32 PM)  Physical Exam  General:  patient is stable.  She is overweight.  She is tired from her work over the holiday. Head:  head is atraumatic. Eyes:  no xanthelasma. Neck:  no jugulovenous extension. Chest Wall:  no chest wall tenderness. Lungs:  lungs are clear respiratory effort is not labored. Heart:  cardiac exam reveals an S1-S2.  There are no clicks or significant murmurs Abdomen:  abdomen is soft. Msk:  no musculoskeletal deformities. Extremities:  no peripheral edema. Skin:  no skin rashes. Psych:  patient is oriented to person time and place.  Affect is normal.    ICD Specifications Following MD:  Lewayne Bunting, MD     ICD Vendor:  [facility] Scientific     ICD Model Number:  TELOGEN 100     ICD Serial Number:  [id number] ICD DOI:  11/03/2009  ICD Implanting MD:  Lewayne Bunting, MD Research Study Name MADIT-RIT  Lead 1:    Location: RA     DOI: 11/03/2009     Model #: 1610     Serial #: [id number]     Status: active Lead 2:    Location: RV     DOI: 11/03/2009     Model #: 0157     Serial #: [id number]     Status: active  Indications::  NICM   ICD Follow Up ICD Dependent:  No      Episodes Coumadin:  No  Brady Parameters Mode DDI     Lower Rate Limit:  40     PAV 350      Tachy Zones VF:  250     VT:  200     VT1:  170     Impression & Recommendations:  Problem # 1:  OVERWEIGHT (ICD-278.02) The patient's weight is playing a role in  her overall status at this time.  She is planning to talk with a nutritionist.  I suggested that she might combine this information with a weight watcher's approach to weight loss.  Problem # 2:  AUTOMATIC IMPLANTABLE CARDIAC DEFIBRILLATOR SITU (ICD-V45.02) her ICD is in place.  There has been no discharge.  She is stable.  Problem # 3:  DYSLIPIDEMIA (ICD-272.4)  Her updated medication list for this problem includes:    Crestor 10 Mg Tabs (Rosuvastatin calcium) ..... One by mouth daily The patient is on Crestor for her lipids.  She will followup with Dr. Lenord Fellers concerning this.  I do not have her labs.  We know that she had normal coronary arteries by catheterization in 2009.  Her cardiomyopathy is nonischemic.  Treatment of her cholesterol of course is important. But she does not need to be pushed aggressively as we would if she had coronary disease.  Problem # 4:  CHRONIC SYSTOLIC HEART FAILURE (ICD-428.22)  Her updated medication list for this problem includes:    Altace 2.5 Mg Tabs (Ramipril) .Marland Kitchen... 1 once daily    Aspirin 81 Mg Tbec (Aspirin) ..... One by mouth every day    Lasix 40 Mg Tabs (Furosemide) .Marland Kitchen... 1 once daily    Carvedilol 25 Mg Tabs (Carvedilol) .Marland Kitchen... Take one tablet by mouth twice a day    Spironolactone 25 Mg Tabs (Spironolactone) .Marland Kitchen... Take one tablet by mouth daily Her CHF is stable.  I considered  pushing her Altace  up to 5 mg daily.  In the past she had problems with hypotension.  If her systolic pressure remains greater than 105 all the time, I would consider pushing her Altase dose up to 5 mg daily  EKG is done today and reviewed by me.  There is normal sinus rhythm.  She has mild diffuse T wave inversion.  I have looked back and several EKGs.  She has varying T wave changes over time.  She has had an EKG that looked like this in the past.  No further workup needed.  Other Orders: EKG w/ Interpretation (93000)  Patient Instructions: 1)  Your physician wants you  to follow-up in:  6 months.  You will receive a reminder letter in the mail two months in advance. If you don't receive a letter, please call our office to schedule the follow-up appointment.

## 2010-10-15 NOTE — Letter (Signed)
Summary: TSH reminder  [facility], Main Office  [address]   [place], [place] [address]   Phone: [phone]  Fax: [phone]        December 22, 2009 MRN: [id number]    [facility] [address] [place], [place]  [address]    Dear Ms. ABEE,  It is time to recheck your thyroid level.  This level was slightly abnormal on your last labwork and we need to repeat it.  Please give our office a call to schedule the labs.    Sincerely,  Meredith Staggers, RN Willa Rough, MD  This letter has been electronically signed by your physician.

## 2010-10-15 NOTE — Assessment & Plan Note (Signed)
Summary: pc2   Primary Provider:  Sharlet Salina, MD   History of Present Illness: Ms. Amico returns today for followup of her DCM and ICD.  She underwent ICD implant back in February 2011 for primary prevention as part of our MADIT RIT study.  She denies c/p, sob or peripheral edema and she has had no intercurrent ICD shocks.  No other symptoms today.   Current Medications (verified): 1)  Prevacid 30 Mg Cpdr (Lansoprazole) .... Take One Tablet By Mouth Once Daily. 2)  Altace 2.5 Mg Tabs (Ramipril) .Marland Kitchen.. 1 Once Daily 3)  Celexa 40 Mg Tabs (Citalopram Hydrobromide) .Marland Kitchen.. 1 Once Daily 4)  Aspirin 81 Mg  Tbec (Aspirin) .... One By Mouth Every Day 5)  Lasix 40 Mg Tabs (Furosemide) .Marland Kitchen.. 1 Once Daily 6)  Vitamin D .... Once Daily 7)  Synthroid 1200 Mcg Tabs (Levothyroxine Sodium) 8)  Carvedilol 25 Mg Tabs (Carvedilol) .... Take One Tablet By Mouth Twice A Day 9)  Tylenol Sinus .... Prn 10)  Ibuprofen Ib 200 Mg Tabs (Ibuprofen) .... As Needed 11)  Spironolactone 25 Mg Tabs (Spironolactone) .... Take One Tablet By Mouth Daily  Allergies (verified): No Known Drug Allergies  Past History:  Past Medical History: Last updated: 09/19/2009 Nonischemic cardiomyopathy Chronic systolic congestive heart failure Normal coronary arteries by catheter September 2009 Old left bundle branch block Biventricular ICD he recommended by Dr. Ladona Ridgel... January, 2010 Hypothyroidism GERD Dyslipidemia Borderline diabetes Ejection fraction 25-30% .Marland KitchenMarland Kitchen2009 / echocardiogram September, 2010... EF 25-30% diffuse hypokinesis Mitral regurgitation...mild.... echo.... September, 2010  Review of Systems  The patient denies chest pain, syncope, dyspnea on exertion, and peripheral edema.    Physical Exam  General:  in general she is stable today but she has gained weight. Head:  head is atraumatic. Eyes:  no xanthelasma. Neck:  no jugular venous distention. Chest Wall:  no chest wall tenderness. Well healde  ICD incision. Lungs:  lungs are clear.  Respiratory effort is nonlabored. Heart:  cardiac exam reveals an S1-S2.  No clicks or significant murmurs. Abdomen:  abdomen is soft. Msk:  no musculoskeletal deformities. Pulses:  pulses normal in all 4 extremities Extremities:  trace peripheral edema. Neurologic:  Alert and oriented x 3.    ICD Specifications Following MD:  Lewayne Bunting, MD     ICD Vendor:  [facility] Scientific     ICD Model Number:  TELOGEN 100     ICD Serial Number:  [id number] ICD DOI:  11/03/2009     ICD Implanting MD:  Lewayne Bunting, MD Research Study Name MADIT-RIT  Lead 1:    Location: RA     DOI: 11/03/2009     Model #: 0454     Serial #: [id number]     Status: active Lead 2:    Location: RV     DOI: 11/03/2009     Model #: 0157     Serial #: [id number]     Status: active  Indications::  NICM   ICD Follow Up Battery Voltage:  ok V     Charge Time:  8.4 seconds     Battery Est. Longevity:  8.5 yrs Underlying rhythm:  SR ICD Dependent:  No       ICD Device Measurements Atrium:  Amplitude: 6.5 mV, Impedance: 682 ohms, Threshold: 0.7 V at 0.4 msec Right Ventricle:  Amplitude: 22.6 mV, Impedance: 583 ohms, Threshold: 0.4 V at 0.4 msec Shock Impedance: 58 ohms   Episodes MS Episodes:  0  Percent Mode Switch:  0     Shock:  0     ATP:  0     Nonsustained:  1     Atrial Therapies:  0 Atrial Pacing:  0%     Ventricular Pacing:  0%  Brady Parameters Mode DDI     Lower Rate Limit:  40     PAV 350      Tachy Zones VF:  250     VT:  200     VT1:  170     Next Cardiology Appt Due:  04/13/2010 Tech Comments:  MADIT RIT --CHECKED BY INDUSTRY.  NORMAL DEVICE FUNCTION.   CHANGED OUTPUTS- A 2.0 0.39ms AND RV 2.5 @ 0.48ms.  ROV IN 3 MTHS. Vella Kohler  Feb 03, 2010 2:06 PM MD Comments:  Agree with above.  Impression & Recommendations:  Problem # 1:  AUTOMATIC IMPLANTABLE CARDIAC DEFIBRILLATOR SITU (ICD-V45.02) Her device is working normally.  Will recheck in several  months.  Problem # 2:  CHRONIC SYSTOLIC HEART FAILURE (ICD-428.22) She remains class 2 on maximal medical therapy.  A low sodium diet and regular daily exercise is recommended. Her updated medication list for this problem includes:    Altace 2.5 Mg Tabs (Ramipril) .Marland Kitchen... 1 once daily    Aspirin 81 Mg Tbec (Aspirin) ..... One by mouth every day    Lasix 40 Mg Tabs (Furosemide) .Marland Kitchen... 1 once daily    Carvedilol 25 Mg Tabs (Carvedilol) .Marland Kitchen... Take one tablet by mouth twice a day    Spironolactone 25 Mg Tabs (Spironolactone) .Marland Kitchen... Take one tablet by mouth daily  Her updated medication list for this problem includes:    Altace 2.5 Mg Tabs (Ramipril) .Marland Kitchen... 1 once daily    Aspirin 81 Mg Tbec (Aspirin) ..... One by mouth every day    Lasix 40 Mg Tabs (Furosemide) .Marland Kitchen... 1 once daily    Carvedilol 25 Mg Tabs (Carvedilol) .Marland Kitchen... Take one tablet by mouth twice a day    Spironolactone 25 Mg Tabs (Spironolactone) .Marland Kitchen... Take one tablet by mouth daily  Problem # 3:  DYSLIPIDEMIA (ICD-272.4) A low fat diet is recommended.  Patient Instructions: 1)  Your physician recommends that you schedule a follow-up appointment in: 9 months with Dr Ladona Ridgel

## 2010-10-15 NOTE — Assessment & Plan Note (Signed)
Summary: 6 MO F/U   Visit Type:  Follow-up Primary Provider:  Sharlet Salina, MD  CC:  cardiomyopathy.  History of Present Illness: Patient returns for followup of her cardiomyopathy.  I saw her last in January, 2011.  At that time I felt that I have optimized all of her medications.  She then underwent placement of an ICD by Dr. Ladona Ridgel in February, 2011.  The procedure went well and she saw Dr. Ladona Ridgel back for followup in May, 2011. She's feeling well. She's not had any shortness of breath.  She's not had chest pain.  There is been no syncope or presyncope.  She has not had an ICD discharge.  Current Medications (verified): 1)  Prevacid 30 Mg Cpdr (Lansoprazole) .... Take One Tablet By Mouth Once Daily. 2)  Altace 2.5 Mg Tabs (Ramipril) .Marland Kitchen.. 1 Once Daily 3)  Celexa 40 Mg Tabs (Citalopram Hydrobromide) .Marland Kitchen.. 1 Once Daily 4)  Aspirin 81 Mg  Tbec (Aspirin) .... One By Mouth Every Day 5)  Lasix 40 Mg Tabs (Furosemide) .Marland Kitchen.. 1 Once Daily 6)  Vitamin D .... Once Daily 7)  Synthroid 112 Mcg Tabs (Levothyroxine Sodium) .... Once Daily 8)  Carvedilol 25 Mg Tabs (Carvedilol) .... Take One Tablet By Mouth Twice A Day 9)  Tylenol Sinus .... Prn 10)  Ibuprofen Ib 200 Mg Tabs (Ibuprofen) .... As Needed 11)  Spironolactone 25 Mg Tabs (Spironolactone) .... Take One Tablet By Mouth Daily  Allergies (verified): No Known Drug Allergies  Past History:  Past Medical History: Nonischemic cardiomyopathy Chronic systolic congestive heart failure Normal coronary arteries by catheter September 2009 Old left bundle branch block ICD placed... February, 2011... Dr. Ladona Ridgel Hypothyroidism GERD Dyslipidemia Borderline diabetes Ejection fraction 25-30% .Marland KitchenMarland Kitchen2009 / echocardiogram September, 2010... EF 25-30% diffuse hypokinesis Mitral regurgitation...mild.... echo.... September, 2010  Review of Systems       Patient denies fever, chills, headache, sweats, rash, change in vision, change in hearing, chest  pain, cough, nausea vomiting, urinary symptoms.  Voltages and are reviewed and are negative.  Vital Signs:  Patient profile:   65 year old female Height:      60 inches Weight:      182 pounds BMI:     35.67 Pulse rate:   75 / minute BP sitting:   96 / 64  (right arm) Cuff size:   regular  Vitals Entered By: Hardin Negus, RMA (March 17, 2010 2:40 PM)  Physical Exam  General:  patient is overweight but stable. Eyes:   no xanthelasma. Neck:  no jugular venous distention. Lungs:  lungs are clear.  Respiratory effort is unlabored. Heart:  cardiac exam reveals S1-S2.  No clicks or significant murmurs. Abdomen:  abdomen is soft. Extremities:  no peripheral edema. Psych:  patient is oriented to person time and place.  Affect is normal.    ICD Specifications Following MD:  Lewayne Bunting, MD     ICD Vendor:  [facility] [employer]     ICD Model Number:  TELOGEN 100     ICD Serial Number:  [id number] ICD DOI:  11/03/2009     ICD Implanting MD:  Lewayne Bunting, MD Research Study Name MADIT-RIT  Lead 1:    Location: RA     DOI: 11/03/2009     Model #: 8119     Serial #: [id number]     Status: active Lead 2:    Location: RV     DOI: 11/03/2009     Model #: 3086  Serial #: [id number]     Status: active  Indications::  NICM   ICD Follow Up ICD Dependent:  No      Brady Parameters Mode DDI     Lower Rate Limit:  40     PAV 350      Tachy Zones VF:  250     VT:  200     VT1:  170     Impression & Recommendations:  Problem # 1:  AUTOMATIC IMPLANTABLE CARDIAC DEFIBRILLATOR SITU (ICD-V45.02) The patient is doing well with her ICD in place.  Problem # 2:  CHRONIC SYSTOLIC HEART FAILURE (ICD-428.22)  Her updated medication list for this problem includes:    Altace 2.5 Mg Tabs (Ramipril) .Marland Kitchen... 1 once daily    Aspirin 81 Mg Tbec (Aspirin) ..... One by mouth every day    Lasix 40 Mg Tabs (Furosemide) .Marland Kitchen... 1 once daily    Carvedilol 25 Mg Tabs (Carvedilol) .Marland Kitchen... Take one tablet by mouth twice a  day    Spironolactone 25 Mg Tabs (Spironolactone) .Marland Kitchen... Take one tablet by mouth daily Patient is on appropriate medications and stable.  No change in therapy needed.  Problem # 3:  MITRAL REGURGITATION (ICD-396.3) Mitral regurgitation is mild.  No change in therapy.  Patient Instructions: 1)  Your physician wants you to follow-up in:  6 months. You will receive a reminder letter in the mail two months in advance. If you don't receive a letter, please call our office to schedule the follow-up appointment.

## 2010-10-16 NOTE — Cardiovascular Report (Signed)
Summary: Office Visit   Office Visit   Imported By: Roderic Ovens 11/21/2009 11:04:59  _____________________________________________________________________  External Attachment:    Type:   Image     Comment:   External Document

## 2010-10-21 ENCOUNTER — Encounter (INDEPENDENT_AMBULATORY_CARE_PROVIDER_SITE_OTHER): Payer: Self-pay

## 2010-10-21 ENCOUNTER — Encounter: Payer: Self-pay | Admitting: Internal Medicine

## 2010-10-21 ENCOUNTER — Encounter (INDEPENDENT_AMBULATORY_CARE_PROVIDER_SITE_OTHER): Payer: BC Managed Care – PPO

## 2010-10-21 DIAGNOSIS — I428 Other cardiomyopathies: Secondary | ICD-10-CM

## 2010-10-21 DIAGNOSIS — R0989 Other specified symptoms and signs involving the circulatory and respiratory systems: Secondary | ICD-10-CM

## 2010-11-03 ENCOUNTER — Ambulatory Visit
Admission: RE | Admit: 2010-11-03 | Discharge: 2010-11-03 | Disposition: A | Discharge status: Home / Self Care | Payer: BC Managed Care – PPO | Source: Ambulatory Visit | Attending: Internal Medicine | Admitting: Internal Medicine

## 2010-11-03 DIAGNOSIS — Z1239 Encounter for other screening for malignant neoplasm of breast: Secondary | ICD-10-CM

## 2010-11-04 NOTE — Procedures (Signed)
Summary: device check/sl   Allergies: No Known Drug Allergies   ICD Specifications Following MD:  Lewayne Bunting, MD     ICD Vendor:  [facility] [employer]     ICD Model Number:  TELOGEN 100     ICD Serial Number:  [id number] ICD DOI:  11/03/2009     ICD Implanting MD:  Lewayne Bunting, MD Research Study Name MADIT-RIT  Lead 1:    Location: RA     DOI: 11/03/2009     Model #: 0454     Serial #: [id number]     Status: active Lead 2:    Location: RV     DOI: 11/03/2009     Model #: 2956     Serial #: [id number]     Status: active  Indications::  NICM   ICD Follow Up Remote Check?  No Battery Voltage:  good V     Charge Time:  10.5 years seconds     ICD Dependent:  No       ICD Device Measurements Atrium:  Amplitude: 5.3 mV, Impedance: 662 ohms,  Right Ventricle:  Amplitude: 15.8 mV, Impedance: 502 ohms,  Shock Impedance: 53 ohms   Episodes MS Episodes:  0     Percent Mode Switch:  0     Coumadin:  No Shock:  0     ATP:  0     Nonsustained:  1      Brady Parameters Mode DDI     Lower Rate Limit:  40     PAV 350      Tachy Zones VF:  250     VT:  200     VT1:  170     Tech Comments:  Checked by Media planner for research. Altha Harm, LPN  October 21, 2010 4:13 PM

## 2010-12-24 LAB — COMPREHENSIVE METABOLIC PANEL
Albumin: 3.6 g/dL (ref 3.5–5.2)
BUN: 17 mg/dL (ref 6–23)
CO2: 30 mEq/L (ref 19–32)
Chloride: 104 mEq/L (ref 96–112)
Creatinine, Ser: 0.65 mg/dL (ref 0.4–1.2)
GFR calc non Af Amer: >60 mL/min (ref >60)
Glucose, Bld: 101 mg/dL — ABNORMAL HIGH (ref 70–99)
Total Bilirubin: 0.6 mg/dL (ref 0.3–1.2)

## 2010-12-24 LAB — DIFFERENTIAL
Basophils Absolute: 0 10*3/uL (ref 0.0–0.1)
Basophils Relative: 1 % (ref 0–1)
Lymphocytes Relative: 42 % (ref 12–46)
Neutro Abs: 2.3 10*3/uL (ref 1.7–7.7)
Neutrophils Relative %: 48 % (ref 43–77)

## 2010-12-24 LAB — CBC
HCT: 36.5 % (ref 36.0–46.0)
MCHC: 34.7 g/dL (ref 30.0–36.0)
Platelets: 200 10*3/uL (ref 150–400)
RDW: 13.1 % (ref 11.5–15.5)

## 2010-12-24 LAB — TROPONIN I: Troponin I: <0.01 ng/mL (ref 0.00–0.06)

## 2010-12-24 LAB — BRAIN NATRIURETIC PEPTIDE: Pro B Natriuretic peptide (BNP): 230 pg/mL — ABNORMAL HIGH (ref 0.0–100.0)

## 2010-12-24 LAB — CK TOTAL AND CKMB (NOT AT ARMC): CK, MB: 1.3 ng/mL (ref 0.3–4.0)

## 2010-12-24 LAB — MAGNESIUM: Magnesium: 2.2 mg/dL (ref 1.5–2.5)

## 2010-12-24 LAB — D-DIMER, QUANTITATIVE: D-Dimer, Quant: 0.23 ug/mL-FEU (ref 0.00–0.48)

## 2010-12-24 LAB — TSH: TSH: 0.156 u[IU]/mL — ABNORMAL LOW (ref 0.350–4.500)

## 2011-01-04 ENCOUNTER — Ambulatory Visit (INDEPENDENT_AMBULATORY_CARE_PROVIDER_SITE_OTHER): Payer: BC Managed Care – PPO | Admitting: Internal Medicine

## 2011-01-04 DIAGNOSIS — R079 Chest pain, unspecified: Secondary | ICD-10-CM

## 2011-01-04 DIAGNOSIS — K219 Gastro-esophageal reflux disease without esophagitis: Secondary | ICD-10-CM

## 2011-01-26 NOTE — Assessment & Plan Note (Signed)
[facility]                         ELECTROPHYSIOLOGY OFFICE NOTE   [name], [name]                      MRN:          [id number]  DATE:09/23/2008                            DOB:          1946/05/06    [name] is referred today by Dr. Willa Rough.  She is a very  pleasant 65 year old woman who has developed a nonischemic  cardiomyopathy with congestive heart failure.  Her symptoms have been  present for many months, but she was actually diagnosed back in August  2009 at which time she presented with worsening congestive heart  failure.  At that time, she was diuresed.  She underwent catheterization  where she was found to have severe LV dysfunction, an EF of 30%, and  normal coronary arteries.  The patient has been treated with maximal  medical therapy under the direction of Dr. Myrtis Ser.  Despite this, she  continues to experience heart failure symptoms.  Her symptoms varied  from day to day between class II and class III.  At that time, she has  fairly good energy and at other time she feels fatigued and tired.  Her  symptoms seem to be more related to low output state rather than  breathlessness and pulmonary edema.   Her past medical history is notable for hypothyroidism which has been  treated.  She has a history of obesity, but she has recently lost about  25-30 pounds.  She has a history of gastroesophageal reflux disease,  which is controlled with hydrogen pump blockers.  She has a history of  dyslipidemia, also controlled.  She has a history of diabetes, which is  followed because it is borderline.  She has a history of congestive  heart failure as previously described.   CURRENT MEDICATIONS:  1. Protonix 40 a day.  2. Altace 2.5 daily.  3. Celexa 40 a day.  4. Aspirin 81 a day.  5. Potassium 20 a day.  6. Lasix 40 a day.  7. Coreg 6.25 in the morning and 9.375 in the evening.  8. Synthroid 112 mcg daily.  9. Aspirin, Tylenol,  and ibuprofen p.r.n.   FAMILY HISTORY:  Noncontributory for cardiomyopathy or sudden cardiac  death.   SOCIAL HISTORY:  The patient denies tobacco or ethanol abuse.   Her review of systems is noted in the HPI, otherwise all systems were  reviewed and were negative except as noted above.   PHYSICAL EXAMINATION:  GENERAL:  Notable for a pleasant middle-aged  woman in no acute distress.  VITAL SIGNS:  Blood pressure was 120/70.  The pulse was 76 and regular.  Respirations were 18.  The weight was 153 pounds.  HEENT:  Normocephalic and atraumatic.  Pupils are equal and round.  Oropharynx is moist.  Sclerae are anicteric.  NECK:  No jugular venous distention.  There is no thyromegaly.  Trachea  is midline.  Carotids are 2+ and symmetric.  LUNGS:  Clear bilaterally to auscultation.  No wheezes, rales, or  rhonchi are present.  No increased work of breathing.  CARDIOVASCULAR:  Regular rate and rhythm.  Normal S1 and  a split S2.  The PMI is enlarged and laterally displaced.  There is no S3 or S4  gallop today.  There are no murmurs appreciated.  ABDOMEN:  Soft and nontender.  There is no organomegaly.  Bowel sounds  are present.  There is no rebound or guarding.  EXTREMITIES:  No cyanosis, clubbing, or edema.  The pulses are 2+ and  symmetric.  NEUROLOGIC:  Alert and oriented x3.  Cranial nerves are intact.  Strength is 5/5 and symmetric.   The EKG demonstrates sinus rhythm with left bundle-branch block and QRS  duration of 156 milliseconds.   IMPRESSION:  1. Nonischemic cardiomyopathy with severe left ventricular      dysfunction, ejection fraction 25-30%.  2. Congestive heart failure (chronic), now well compensated, but still      low output symptoms class II to III.  3. Left bundle-branch block.   DISCUSSION:  I discussed the treatment options with the patient.  I  recommended proceeding with BiV ICD insertion in conjunction with all of  the above.  The risks, benefits, goals,  and expectations of the  procedure have been outlined with the patient.  She is considering her  options.  She would like to talk with Dr. Myrtis Ser before scheduling her  procedure.     Doylene Canning. Ladona Ridgel, MD  Electronically Signed    GWT/MedQ  DD: 09/23/2008  DT: 09/24/2008  Job #: [id number]   cc:   Luanna Cole. Lenord Fellers, M.D.

## 2011-01-26 NOTE — Assessment & Plan Note (Signed)
[facility]                            CARDIOLOGY OFFICE NOTE   [name], [name]                      MRN:          [id number]  DATE:10/22/2008                            DOB:          [birth year]    [name] is doing very well.  We have been successful and beginning  to titrate her Coreg dose up.  She is feeling well.  She has brought me  reporting her listings of her blood pressures.  In the past several  days, her pressure appears to run in the range of right around 100/60  with a pulse in the 70s.  She is not having any syncope or presyncope.  She is doing well.  When I saw her last, we had increased her carvedilol  dose and it is now time to push it higher.   PAST MEDICAL HISTORY:   ALLERGIES:  No known drug allergies.   MEDICATIONS:  See the flow sheet.   REVIEW OF SYSTEMS:  She has no GI or GU symptoms.  There are no fevers  or chills.  There is no headache.  No skin rashes.  Otherwise, her  review of systems is negative.   PHYSICAL EXAMINATION:  VITAL SIGNS:  Blood pressure today 110/54 with  pulse of 76.  GENERAL:  The patient is oriented to person, time, and place.  Affect is  normal.  HEENT:  No xanthelasma.  She has normal extraocular motion.  There are  no carotid bruits.  There is no jugular venous distention.  LUNGS:  Clear.  Respiratory effort is not labored.  CARDIAC:  S1 and S2.  There are no clicks or significant murmurs.  ABDOMEN:  Soft.  She has no peripheral edema.  No labs were done today.   Problems are listed on my note in the past including her nonischemic  cardiomyopathy, left bundle-branch block, and chronic systolic heart  failure.  She is stable.  We will be increasing her carvedilol dose.  She will go to 12.5 b.i.d. for 3 weeks and then 18.75 b.i.d. for 4 weeks  and then I will see her back, and she knows to be in contact with [place] if  she has any difficulty.     Luis Abed, MD, [facility]  Electronically Signed    JDK/MedQ  DD: 10/22/2008  DT: 10/23/2008  Job #: [id number]   cc:   Luanna Cole. Lenord Fellers, M.D.

## 2011-01-26 NOTE — Consult Note (Signed)
NAME:  [name], [name] NO.:  [id number]   MEDICAL RECORD NO.:  [id number]          PATIENT TYPE:  EMS   LOCATION:  MAJO                         FACILITY:  [facility]   PHYSICIAN:  Pricilla Riffle, MD, FACCDATE OF BIRTH:  08-27-1946   DATE OF CONSULTATION:  DATE OF DISCHARGE:                                 CONSULTATION   PRIMARY CARDIOLOGIST:  Luis Abed, MD, [facility]   PRIMARY CARE Cainan Trull:  Luanna Cole. Lenord Fellers, M.D.   PATIENT PROFILE:  A 65 year old female with prior history of nonischemic  cardiomyopathy and chronic systolic heart failure who presents with  chest discomfort and mild dyspnea.   PROBLEMS:  1. Nonischemic cardiomyopathy/chronic systolic congestive heart      failure.      a.     On August 26, 2008, 2-D echocardiogram EF 25% with       akinesis of the septum, inferior and apical walls.  There were       anterolateral hypokinesis.  2. Normal coronary arteries by cardiac catheterization in September      2009.  3. Hypothyroidism.  4. Gastroesophageal reflux disease.  5. Borderline diet-controlled diabetes.  6. Chronic left bundle-branch block.   ALLERGIES:  No known drug allergies.   HISTORY OF PRESENT ILLNESS:  A 65 year old female with the above problem  list.  She was in her usual state of health approximately 1 week ago  when she developed a constellation of symptoms.  First, she noted a  constant fullness in her chest, dyspnea on exertion as well as  intermittent indigestion that occurs after eating.  With that she has  noted that it has been present for the entire week.  Symptoms are not  associated with anything and nor are they aggravated or alleviated by  anything.  Next, she has noted some mild increase in her baseline level  of dyspnea on exertion.  Next, she has had an occasional sharp left  flank and chest discomfort that last a minute or so and resolve  spontaneously.  Next, she also reports that she has had some increase in  gas  with belching as well as abdominal fullness and early satiety.  She  does not have any PND, orthopnea, or lower extremity edema.  She talked  to her friend today, who recommended she get checked up and so she came  into the ED.  Currently, she is pain free without any dyspnea.   HOME MEDICATIONS:  1. Altace 2.5 mg daily.  2. Aspirin 81 mg daily.  3. Celexa 20 mg daily.  4. Coreg 18.75 mg b.i.d.  5. Lasix 40 mg daily.  6. Potassium chloride 20 mEq daily.  7. Protonix 40 mg daily.  8. Synthroid 100 mcg daily.   FAMILY HISTORY:  Mother died of an MI at 44, she also has a history of  hypertension, diabetes.  Father died of lung cancer at 62.  She has no  sibling.   SOCIAL HISTORY:  She lives in [place] by herself.  She denies tobacco  or drug use.  She will drink 3 beers once  a week.  She does not  routinely exercise.   REVIEW OF SYSTEMS:  Positive for symptoms as outlined in the HPI.  Otherwise, all systems reviewed are negative.  She is a full code.   PHYSICAL EXAMINATION:  VITAL SIGNS:  Temperature 98.0, heart rate 72,  respirations 18, blood pressure 124/80, and pulse ox 100% on room air.  GENERAL:  A pleasant female in no acute distress.  Awake, alert,  oriented x3.  HEENT:  Normal.  NEUROLOGIC:  Grossly intact and nonfocal.  SKIN:  Warm and dry without lesions or masses.  PSYCHIATRIC:  Normal affect.  MUSCULOSKELETAL:  Grossly normal without deformity or effusion.  NECK:  No bruits or JVD.  LUNGS:  Respirations regular and unlabored.  Clear to auscultation.  CARDIAC:  Regular, S1 and S2.  Positive S4, no murmurs.  ABDOMEN:  Round, soft, nontender, and nondistended.  Bowel sounds  present x4.  EXTREMITIES:  Warm, dry, and pink.  No clubbing, cyanosis, or edema.  Dorsalis pedis and posterior tibial pulses 2+ and equal bilaterally.   Chest x-ray and lab work is pending.  EKG shows sinus rhythm with left  bundle-branch block, no acute ST-T changes.   ASSESSMENT AND  PLAN:  1. Chest discomfort/indigestion.  This appears to have been worsened      with food and had been present more or less for the entire week.      She tried GI cocktail in the ED.  We will check one set of cardiac      markers as well as CBC, CMET, TSH, and D-dimer.  She had a normal      cardiac catheterization in September 2009 and I doubt, her symptoms      are ischemic in nature.  Provided that her blood work looks okay,      we will likely discharge her from the ED.  2. Chronic systolic congestive heart failure/nonischemic      cardiomyopathy.  The patient appears euvolemic today.  I will      continue her beta-blocker, ACE inhibitor and Lasix at current      dosages.  3. Hypothyroidism.  She is on Synthroid at home, this is followed by      Dr. Lenord Fellers.  4. Disposition.  Further plans pending laboratory and radiographic      evaluation.      Nicolasa Ducking, ANP      Pricilla Riffle, MD, [facility]  Electronically Signed   CB/MEDQ  D:  12/02/2008  T:  12/03/2008  Job:  [id number]

## 2011-01-26 NOTE — H&P (Signed)
NAME:  [name], [name] NO.:  [id number]   MEDICAL RECORD NO.:  [id number]          PATIENT TYPE:  INP   LOCATION:  3731                         FACILITY:  [facility]   PHYSICIAN:  Luis Abed, MD, FACCDATE OF BIRTH:  08/24/1946   DATE OF ADMISSION:  05/13/2008  DATE OF DISCHARGE:                              HISTORY & PHYSICAL   Ms. Wirsing is a very pleasant 65 year old female who is followed by Dr.  Lenord Fellers. The patient works and is fairly active at work walking and going  up and down stairs.  Historically, she says that she has had significant  GERD.  She has been on a PPI for a long period of time.  It is of note  that her GERD symptom historically was chest discomfort.  She seemed  however to improve when she was on PPIs.  More recently, she has had  continued chest discomfort, but her PPI has not helped.  In addition,  she has had very limiting exertional shortness of breath.  She had some  mild PND and orthopnea.  She does not note significant edema.  However,  she does admit that when getting her weight taken at work on a regular  basis there has been an unexpected weight gain of 4 pounds in the last  week.  She saw Dr. Lenord Fellers today and was short of breath and in fact in  Dr. Beryle Quant office she received a pulmonary treatment.  She said that  it helped somewhat.  She does not describe any wheezing historically.  She was admitted by Dr. Lenord Fellers to our service.  At the moment, she is  comfortable.  The patient does not smoke and she has never smoked.  There is no history of hypertension. There is question of borderline  glucoses historically, but she does not carry a formal diagnosis of  diabetes.  She is overweight.  We are not sure about her cholesterol  status.  There is a history of hypothyroidism and she is on 0.125 mg of  Synthroid daily.   PAST MEDICAL HISTORY:   ALLERGIES:  There are no known drug allergies.   MEDICATIONS AT HOME:  1. Synthroid  0.125 mg daily.  2. Protonix 40.  3. Altace 2.5.  4. Celexa 40.  5. Vitamin D.   OTHER MEDICAL PROBLEMS:  See the complete list below.   SOCIAL HISTORY:  She lives in [place].  She lives in [place].   FAMILY HISTORY:  The patient's mother had an MI in her 72s.  The patient  does not have any siblings.   REVIEW OF SYSTEMS:  The patient has had weight gain.  She has not had  fevers or chills.  There are no headaches or eye problems.  She has no  definite skin problems.  Her shortness of breath is quite limiting as  described in the HPI.  She is not having any GI symptoms.  As mentioned  by history, she has significant GERD.  She is not having any major  musculoskeletal problems at this time.  Otherwise, review of systems is  negative.  PHYSICAL EXAM:  Blood pressure today is 130/90.  Her pulse is 105.  Temperature is 97.9.  Her room air saturation is 95%.  The patient is  oriented to person, time and place.  Affect is normal.  HEENT: Reveals no xanthelasma.  She has normal extraocular motion.  There are no carotid bruits.  There is no jugular venous distention.  LUNGS:  Lung evaluation reveals that she is in no distress at this time.  She was quite short of breath at Dr. Beryle Quant office.  She does have  basilar rales posteriorly in both lungs.  CARDIAC: Exam reveals that her heart rate is increased.  She has an S1-  S2 and I believe that she probably has a summation gallop.  ABDOMEN: Abdomen is soft.  There are no masses or bruits.  There is trace peripheral edema.   EKG is pending at this time.   All blood studies are pending at this time.  Chest x-ray had been done  and raised the question of some volume overload.   PROBLEMS:  1. Obesity.  2. History of hypothyroidism, on treatment.  We will recheck TSH.  3. History of significant gastroesophageal reflux disease. She has had      these symptoms for many years.  It is possible that her symptoms of       gastroesophageal reflux disease and symptoms of cardiac ischemia      could be similar.  Will have to be sure that she is not having      significant ischemia at this time.  Her PPI will be continued.  4. History of borderline diabetes over time.  5. *Current increasing weight that is probably fluid, basilar rales      and a resting tachycardia and shortness of breath.  I suspect that      this is congestive heart failure.  Her EKG is to be done along with      her labs.  She will be started on a diuretic this evening.      Ultimately, she will need a beta-blocker.  She is already on a      small dose of an ACE inhibitor.  Before we start any other meds      however, we will wait to see her LV function by echo tomorrow      morning.  In the meantime, we will start with diuresis.  The      patient will then have a 2-D echo tomorrow morning.  After we have      this data and after we have her renal function and other labs, we      will make a decision about whether we will or will not proceed with      cardiac catheterization in the near future.   Medications for now will include her home medicines and aspirin.  We  will not start a beta blocker yet this evening.  Her ACE inhibitor will  be continued.  She will be started on a statin this evening until we  have more information.  She will receive DVT prophylactic dose of  Lovenox but not full anticoagulation this evening.  We will use low-dose  nasal oxygen.  She will be given IV Lasix.      Luis Abed, MD, [facility]  Electronically Signed     JDK/MEDQ  D:  05/13/2008  T:  05/13/2008  Job:  [id number]   cc:   Luanna Cole. Lenord Fellers, M.D.

## 2011-01-26 NOTE — Discharge Summary (Signed)
NAME:  [name], [name] NO.:  [id number]   MEDICAL RECORD NO.:  [id number]          PATIENT TYPE:  INP   LOCATION:  3731                         FACILITY:  [facility]   PHYSICIAN:  Luis Abed, MD, FACCDATE OF BIRTH:  [birth year]   DATE OF ADMISSION:  05/13/2008  DATE OF DISCHARGE:  05/16/2008                               DISCHARGE SUMMARY   PROCEDURES:  1. Left heart catheterization.  2. Right heart catheterization.  3. Coronary arteriogram.  4. Left ventriculogram.  5. A 2-D echocardiogram.   PRIMARY FINAL DISCHARGE DIAGNOSIS:  Shortness of breath.   SECONDARY DIAGNOSES:  1. Nonischemic cardiomyopathy with normal course and an ejection      fraction of 30% in cardiac catheterization, echocardiogram showing      an ejection fraction of 30% and a PAS of 31.  2. Obesity.  3. Hypothyroidism.  4. Gastroesophageal reflux disease.  5. Family history of coronary artery disease in her mother.  6. Dyslipidemia with a total cholesterol of 139, triglycerides 55, HDL      28, and LDL 100.  7. Borderline diabetes with a hemoglobin A1c of 64 and fasting blood      sugars in the 90s to low 100s.  8. History of left bundle-branch block.  9. Acute systolic congestive heart failure.   TIME AT DISCHARGE:  37 minutes.   HOSPITAL COURSE:  [name] is a 65 year old female with no previous  history of cardiac issues.  She had noticed increasing shortness of  breath and dyspnea on exertion.  Dr. Lenord Fellers saw her and sent her to the  emergency room where she was admitted for further evaluation and  treatment.   TSH was within normal limits at 2.746.  An echocardiogram showed an EF  of 30%, so heart catheterization was performed which showed normal  coronary arteries.  An initial chest x-ray showed mild congestive heart  failure.  Her initial BNP was 1938.  It peaked to 2395.  She was  diuresed with IV Lasix, and her respiratory status improved.  Her  initial weight was 82.8  kg and at discharge, she is 78.7 kg.  Her O2  saturation is 98% on room air.  Dr. Myrtis Ser evaluated her on May 16, 2008.  Her respiratory status was felt to be at baseline.  She was  ambulating without chest pain or shortness of breath.  Her cath site was  stable.  Dr. Myrtis Ser felt that [name] was stable for discharge on  May 16, 2008, with close outpatient followup.   DISCHARGE INSTRUCTIONS:  Her activity level is to be increased  gradually.  She is not to do any lifting for a week post-cath.  She is  to stick to a low salt and fat diet.  She is to follow up with Dr. Myrtis Ser  on May 24, 2008, at 4:15.  She is to get a BMET prior to that  appointment on May 23, 2008.   DISCHARGE MEDICATIONS:  1. Synthroid 0.125 mg daily.  2. Protonix 40 mg a day.  3. Altace 2.5 mg a day.  4. Celexa 40 mg a day.  5. Vitamin D as prior to admission.  6. Coreg 3.125 mg b.i.d.  7. Aspirin 81 mg daily.  8. Potassium 20 mEq daily.  9. Lasix 40 mg a day.      Theodore Demark, PA-C      Luis Abed, MD, [facility]  Electronically Signed    RB/MEDQ  D:  05/16/2008  T:  05/17/2008  Job:  [id number]   cc:   Luanna Cole. Lenord Fellers, M.D.

## 2011-01-26 NOTE — Cardiovascular Report (Signed)
NAME:  [name], [name]               ACCOUNT NO.:  [id number]   MEDICAL RECORD NO.:  [id number]          PATIENT TYPE:  INP   LOCATION:  3731                         FACILITY:  [facility]   PHYSICIAN:  Veverly Fells. Excell Seltzer, MD  DATE OF BIRTH:  December 24, 1945   DATE OF PROCEDURE:  05/15/2008  DATE OF DISCHARGE:                            CARDIAC CATHETERIZATION   PROCEDURES:  Right heart catheterization, left heart catheterization,  selective coronary angiography, and left ventricular angiography.   INDICATIONS:  [name] is a 65 year old woman who presented with  newly diagnosed congestive heart failure.  She has been found to have  severe LV dysfunction by echo.  Her heart failure has been treated and  she has had an excellent clinical response.  She was referred for  diagnostic catheterization to assess her hemodynamics and coronary  anatomy.   Risks and indications of the procedure were reviewed with the patient  and informed consent was obtained.  The right groin was prepped and  draped and anesthetized with 1% lidocaine.  Using a modified Seldinger  technique, a 5-French sheath was placed in the right femoral artery and  a 7-French sheath was placed in the right femoral vein.  A Swan-Ganz  catheter was used for the right heart catheterization.  Oxygen  saturations were drawn from the central aorta and pulmonary artery.  Fick cardiac output was calculated.  Standard 5-French Judkins catheters  were used for coronary angiography and left ventriculography.  The  patient tolerated the procedure well.  All catheter exchanges were  performed over guidewire.  There were no immediate complications.   FINDINGS:  Right atrial pressure mean of 5, right ventricular pressure  43/8, pulmonary artery pressure 41/19 with a mean of 29, pulmonary  capillary wedge pressure 18, LV pressure 93/17, and aortic pressure  92/50 with a mean of 67.  Oxygen saturation, pulmonary artery 67 and  aorta 92.   Cardiac output 5.5 L per minute.  Cardiac index 3.1 L per minute per sq.  m.   ANGIOGRAPHY:  Left mainstem:  Angiographically normal and trifurcates  into the LAD, intermediate branch, and left circumflex.   LAD:  Large caliber vessel courses down and wraps around the LV apex.  Three small diagonal branches.  No significant stenosis throughout the  LAD or its diagonal branches.   Intermediate branch:  Large vessel that bifurcates into twin vessels in  its midportion.  No significant stenosis.   Left circumflex:  The left circumflex supplies two small marginal  branches.  There is no significant stenosis throughout.  There is also a  left PDA branch.   Right coronary artery:  Small nondominant vessel.  No significant  stenosis.   Left ventriculography:  There is severe global hypokinesis of the left  ventricle.  The LVEF is 30%.  Inadequate study for assessment of mitral  regurgitation.   ASSESSMENT:  1. Severe global left ventricular dysfunction with left ventricular      ejection fraction of 30%.  2. Normal coronary arteries.   DISCUSSION:  The patient has a nonischemic cardiomyopathy.  Her  hemodynamics appear well  compensated with medical therapy.  She has  mildly elevated pressures and we will give her one additional dose of IV  Lasix today and she is written to change her to p.o. Lasix tomorrow.  Further medical therapy to be directed by Dr. Myrtis Ser.      Veverly Fells. Excell Seltzer, MD  Electronically Signed     MDC/MEDQ  D:  05/15/2008  T:  05/16/2008  Job:  [id number]   cc:   Luis Abed, MD, [facility]  Luanna Cole. Lenord Fellers, M.D.

## 2011-01-26 NOTE — Assessment & Plan Note (Signed)
[facility]                            CARDIOLOGY OFFICE NOTE   [name], [name]                      MRN:          [id number]  DATE:10/09/2008                            DOB:          October 04, 1945    [name] is here for followup.  See my complete note of September 02, 2008.  She has nonischemic cardiomyopathy.  At the time of her last  visit, I referred her to see Dr. Lewayne [name].  There is a very nice  electrophysiology note.  He notes that she has class II to III heart  failure that is stable and left bundle-branch block.  He felt that most  probably the best approach would be BiV ICD insertion.  She returns  today to discuss this further.   She actually is doing well.  She is working regularly.  She is not  having any marked symptoms and her heart failure symptomatology probably  is in the range of class II at this time.   It is of note that after her last visit, I personally re-reviewed all of  her echoes.  Very specifically, I did decide that there had been some  improvement in left ventricular size and function.  This was part of our  discussion today.   PAST MEDICAL HISTORY:   ALLERGIES:  No known drug allergies.   MEDICATIONS:  See the flow sheet.   REVIEW OF SYSTEMS:  She has no GI or GU symptoms.  There are no  headaches.  There is no fevers or chills.  She has no skin rashes.  Review of systems otherwise is negative.   PHYSICAL EXAMINATION:  VITAL SIGNS:  Blood pressure is 112/65 with the  pulse of 79.  GENERAL:  The patient is oriented to person, time, and place.  Affect is  normal.  HEENT:  No xanthelasma.  She has normal extraocular motion.  There are  no carotid bruits.  There is no jugular venous distention.  LUNGS:  Clear.  Respiratory effort is not labored.  CARDIAC:  S1 with an S2.  There are no clicks or significant murmurs.  ABDOMEN:  Soft.  EXTREMITIES:  She has no peripheral edema.   Problems are listed on  my prior note.  The patient's cardiomyopathy is  significant.  She and I had a long talk.  We have decided that since I  had seen some improvement after re-review of her echoes that we would  wait a longer amount of time before finalizing the decision about her  BiV ICD.  Her pressures are running a little higher and we have more  latitude for increasing her meds.  Her Coreg will be increased to 9 mg  b.i.d.  I will see her back in 2 weeks and we will try to continue to  titrate upwards.  We will repeat an echo again in approximately 3  months.  I made  it clear to her that she probably will still need BiV ICD, but we will  continue to try medically  for a period of time.  Gabrielle Abed, MD, [facility]  Electronically Signed    JDK/MedQ  DD: 10/09/2008  DT: 10/10/2008  Job #: [id number]   cc:   [name]. [name], M.D.

## 2011-01-26 NOTE — Assessment & Plan Note (Signed)
[facility]                            CARDIOLOGY OFFICE NOTE   HERO, MCCATHERN                      MRN:          [id number]  DATE:06/24/2008                            DOB:          [birth year]    OPERATIVE DOCTOR:  Luanna Cole. Lenord Fellers, MD   Ms. Cederberg is seen for followup.  We have been trying very carefully to  adjust the medications for her cardiomyopathy.  I increased her evening  dose of carvedilol to 9.375.  She is having difficulty cutting the small  pill.  We will give her some 3.125 to add to a 6.25 later in the  afternoon.  The patient is working.  She needs to have permission to  climb a ladder one-quarter of the way up, but not to the top.  Some of  these ladders are quite high and their replacements unlimited.  She is  working, and she is active and feeling well.  She feels a little  sluggish in the morning and this could be from her morning medications.   ALLERGIES:  No known drug allergies.   MEDICATIONS:  See the flow sheet.   REVIEW OF SYSTEMS:  She is not having any GI or GU symptoms.  Her review  of systems is negative.   PHYSICAL EXAMINATION:  VITAL SIGNS:  Blood pressure is 98/50 with a  pulse of 72.  This is the best pulse that we have seen yet.  Her weight  is down an additional 2 pounds to 165.  GENERAL:  The patient is oriented to person, time, and place.  Affect is  normal.  HEENT:  No xanthelasma.  She has normal extraocular motion.  NECK:  There are no carotid bruits.  There is no jugular venous  distention.  LUNGS:  Clear.  Respiratory effort is not labored.  CARDIAC:  S1 and S2.  There are no clicks or significant murmurs.  ABDOMEN:  Soft.  EXTREMITIES:  She has no significant peripheral edema today.   The problems are listed on my prior notes including the note of  June 06, 2008.  We are trying to optimize the titration of her meds  and allow her to continue to work.  She will change the timing of her  Altace to the afternoon.  She will therefore take carvedilol 6.25 in the  morning and carvedilol 9.375 later in the afternoon and her Altace at  that time also.  We will see how this works for her.  I will see her  back in several weeks for followup.     Luis Abed, MD, [facility]  Electronically Signed    JDK/MedQ  DD: 06/24/2008  DT: 06/25/2008  Job #: [id number]

## 2011-01-26 NOTE — Assessment & Plan Note (Signed)
[place] [facility]                            CARDIOLOGY OFFICE NOTE   Gabrielle, [name]                      MRN:          [id number]  DATE:06/06/2008                            DOB:          09-Mar-1946    [name] returns for followup.  Recently she was diagnosed with LV  dysfunction and congestive heart failure.  She diuresed.  We are  adjusting her medications.  Since being here last, she called to say  that her blood pressure was low and she felt a little sluggish.  Her  pressure was in the range of 88/68.  In fact she really was not feeling  poorly and she stayed on her medicines and she has done fine with that.  She recorded her vital signs for me.  In the late morning after she has  taken her morning meds, her systolic pressure tends to be in the range  of 90 systolic.  Later in the day it moves up into the range of 115  systolic.  Her pulse is in the 75-85 range.  Optimally, we want her rate  slower.  Unfortunately with her low blood pressure we have to move very  slow.   She is not having any chest pain.  She is not having any marked  shortness of breath.  She is going about activities including her work.  She walks a lot at work at [employer].   ALLERGIES:  No known drug allergies.   MEDICATIONS:  Synthroid, Protonix, Altace 2.5, Celexa, aspirin,  potassium, Lasix 40 daily, and Coreg 6.25 b.i.d. (to be increased to  9.375 at night and 6.25 in the daytime).   OTHER MEDICAL PROBLEMS:  See the list below.   REVIEW OF SYSTEMS:  Other than the HPI, her review of systems is  negative.   PHYSICAL EXAMINATION:  VITAL SIGNS:  Today her pulse rate is 80.  Blood  pressure is 100/74.  Her weight is down to 167.  GENERAL:  The patient is oriented to person, time, and place.  Affect is  normal.  HEENT:  Reveals no xanthelasma.  She has normal extraocular motion.  There are no carotid bruits.  There is no jugular venous distention.  LUNGS:  Clear.  Respiratory effort is not labored.  CARDIAC:  Reveals an S1 with an S2.  There are no clicks or significant  murmurs.  ABDOMEN:  Soft.  EXTREMITIES:  She has no significant peripheral edema.   EKG reveals sinus rhythm with left bundle-branch block.   PROBLEMS:  1. Hypothyroidism, treated.  2. Increased weight.  She is definitely losing weight and doing well.  3. Gastroesophageal reflux disease, improved.  4. Mild dyslipidemia that is being followed.  5. Borderline diabetes being followed.  6. Left bundle-branch block.  7. Chronic systolic heart failure that has stabilized.  8. Severe left ventricular dysfunction.   The titration of her medications is difficult.  She and I discussed at  length the approach.  With borderline low blood pressure, we will keep  her on the lowest dose of Altace.  We will raise  only her carvedilol  dose in the second dose of the day.  I will then see her back in 2-3  weeks.     Luis Abed, MD, [facility]  Electronically Signed    JDK/MedQ  DD: 06/06/2008  DT: 06/07/2008  Job #: [id number]   cc:   Luanna Cole. Lenord Fellers, M.D.

## 2011-01-26 NOTE — Assessment & Plan Note (Signed)
[facility]                            CARDIOLOGY OFFICE NOTE   ZANAYA, BAIZE                      MRN:          [id number]  DATE:05/24/2008                            DOB:          [birth year]    Ms. Forrest is a very pleasant lady who was recently admitted to [facility] with volume overload.  She was feeling shortness of breath and a  tightness with walking.  She was volume overloaded.  She saw Dr. Lenord Fellers  who admitted her to [facility] and asked for [place] to take over her care.  While  at the hospital, she began to diurese immediately.  Echo revealed an  ejection fraction in the 30% range.  Decision was made to proceed with  cardiac catheterization.  The study revealed that she had normal  coronary arteries.  She had mild to mildly elevated filling pressures at  that time after diuresis.  Her meds were continued and she was started  on appropriate medications for LV dysfunction.  She has been stable and  she is actually return to work at [employer].  She is not  having any chest pain.  Overall, she is feeling much better.   PAST MEDICAL HISTORY:   ALLERGIES:  No known drug allergies.   MEDICATIONS:  1. Synthroid 0.125.  2. Protonix.  3. Altace 2.5.  4. Celexa 40.  5. Coreg 3.125 b.i.d. (to be increased to 6.25 b.i.d.).  6. Aspirin 81.  7. Potassium 20 mEq.  8. Lasix 40 mg daily.   OTHER MEDICAL PROBLEMS:  See the list below.   REVIEW OF SYSTEMS:  She is actually doing well and her review of systems  is negative.   PHYSICAL EXAMINATION:  VITAL SIGNS:  Blood pressure today is 115/79.  Her pulse is 87.  Her weight is 171 pounds.  GENERAL:  The patient is oriented to person, time, and place.  Affect is  normal.  HEENT:  No xanthelasma.  She has normal extraocular motion.  NECK:  There are no carotid bruits.  There is no jugular venous  distention.  LUNGS:  Clear.  Respiratory effort is not labored.  CARDIAC:  S1 and S2.  There  are no clicks or significant murmurs.  ABDOMEN:  Soft.  EXTREMITIES:  She has no significant peripheral edema.   No labs are done today.  She did have a BMET on May 22, 2008.  Potassium was 4.3.  BUN was 17 and creatinine 0.8, which is quite  stable.   PROBLEMS:  1. Hypothyroidism, treated.  2. Increased weight and she is trying to lose her weight.  3. Gastroesophageal reflux disease that has been significant over      time.  4. Family history of coronary artery disease.  5. History of mild dyslipidemia that will be followed at this point.  6. Borderline diabetes being followed.  7. History of left bundle-branch block.  8. Status post acute systolic heart failure.  9. Newly diagnosed nonischemic cardiomyopathy with an ejection      fraction in 30% range.  I have talked with her  again about salt and      fluid monitoring.  I will be titrating her cardiac meds.  We      started at low doses because her pressure was fairly low.  She      appears to be able to tolerate higher doses now.  Coreg will be      increased to 6.25 b.i.d.  I will see her back in 7-10 days.  There      is no need to check any lab further today as we have labs from      May 22, 2008.  I will also help to fill out some forms with      her.  I will see her for followup.     Luis Abed, MD, [facility]  Electronically Signed    JDK/MedQ  DD: 05/24/2008  DT: 05/25/2008  Job #: [id number]   cc:   Luanna Cole. Lenord Fellers, M.D.

## 2011-01-26 NOTE — Assessment & Plan Note (Signed)
[place] [facility]                            CARDIOLOGY OFFICE NOTE   TARAHJI, RAMTHUN                      MRN:          [id number]  DATE:07/18/2008                            DOB:          07-30-1946    Ms. Lamb is here for followup.  We have been titrating her  medications.  Her blood pressure is low when she has taken in the mid  morning at work, after she has taking her meds in the morning.  Her  pressures seems to be running in the 90/65 range with a pulse in the 72  range.  She has no significant symptoms from this.  She is working.  She  has not had any chest pain.  There has been no syncope or presyncope.   PAST MEDICAL HISTORY:   ALLERGIES:  No known drug allergies.   MEDICATIONS:  See the flow sheet.   REVIEW OF SYSTEMS:  She has no fevers, chills, or skin rashes.  She has  no GI or GU symptoms.  Her review of systems is negative.   PHYSICAL EXAMINATION:  VITAL SIGNS:  Weight is 161 pounds.  She  continues to lose some weight appropriately.  Blood pressure today is  98/58 with a pulse of 75.  HEENT:  No xanthelasma.  She has normal extraocular motion.  There are  no carotid bruits.  There is no jugular venous distention.  LUNGS:  Clear.  Respiratory effort is not labored.  CARDIAC:  S1 and S2.  There are no clicks or significant murmurs.  ABDOMEN:  Soft.  She has no peripheral edema.   Problems are listed on the note of June 06, 2008.  Chronic systolic  heart failure.  Severe left ventricular dysfunction.  Clinically, she is  quite stable.  I have not been able to titrate her meds any higher.  They will remain at the current dosing.  In approximately 6 weeks, we  will repeat her echo which will then be 3 months after her diagnosis.  I  will then see her back in the office for followup.  It is important that  she only be allowed to climb 25% of the ladder at the place she works.     Luis Abed, MD, [facility]  Electronically  Signed    JDK/MedQ  DD: 07/18/2008  DT: 07/19/2008  Job #: [id number]   cc:   Luanna Cole. Lenord Fellers, M.D.

## 2011-01-26 NOTE — Assessment & Plan Note (Signed)
[facility]                            CARDIOLOGY OFFICE NOTE   [name]                      MRN:          [id number]  DATE:09/02/2008                            DOB:          10/07/1945    [name] is seen for followup.  She has a known nonischemic  cardiomyopathy.  When I first saw her, she was volume overloaded.  We  diuresed her and she has done much better.  She has class II heart  failure.  She does not have PND or orthopnea.  As long as she does not  overexert, she is able to go about her activities.  She does work and  continues to work at [employer].  Her diagnosis was made in  early September 2009.  She was admitted and her echo showed an ejection  fraction in the 25%-30% range.  We did proceed with cardiac  catheterization.  She had normal coronary arteries.  She had elevated  filling pressures.  She was diuresed and she improved greatly.  Her  blood pressure has been low with some mild symptoms since I have known  her.  Based on this, I have only been able to get her Altace dose up to  2.5 mg daily and her carvedilol to 6.25 in the morning and 9.375 in the  evening.  She does remain on 40 mg of Lasix daily.   I decided to proceed with a followup 2-D echo on August 26, 2008, to  reassess her LV function.  She does have a left bundle-branch block,  although we have not seen that with every tracing.  Her apical windows  were poor.  Her ejection fraction was felt to be in the 25% range.  She  was felt to have significant mitral regurgitation that was at least  moderate.  There was mild mitral annular calcification.  Review of her  cath data shows that the cath was not adequate for assessing her mitral  regurgitation.  The end-diastolic dimension was 53 mm and end-systolic  dimension 50 mm.  Her ejection fraction remains in the 25%-30% range.   Today in the office, I had a long discussion with the patient about  the  fact that her left ventricle had not improved.  I explained to her my  choice of medications.  I discussed the possibility of getting further  advice from our heart failure team.  I also discussed the issue of  whether or not ICD or CRT therapy should be considered.  I do want to  proceed with a consult with an EP consult to get help with this regard.   ALLERGIES:  No known drug allergies.   MEDICATIONS:  1. Protonix 40.  2. Altace 2.5.  3. Celexa 40.  4. Aspirin 81.  5. Potassium 20.  6. Lasix 40.  7. Carvedilol 6.25 in the morning and 9.375 at night.  8. Vitamin D.   OTHER MEDICAL PROBLEMS:  See the list below.   REVIEW OF SYSTEMS:  She is not having any GI or GU symptoms.  She has no  fevers, chills, or headaches.  Her review of systems is negative.   PHYSICAL EXAMINATION:  VITAL SIGNS:  She brought me her blood pressure  measurements and on a regular basis her pressure can run anywhere 80-90  systolic.  Here today, it is 112/60 with a pulse of 85.  With this in  mind, we may well be able to titrate her medicines slightly higher and  this will be considered.  HEENT:  No xanthelasma.  She has normal extraocular motion.  NECK:  There are no carotid bruits.  There is no jugular venous  distention.  LUNGS:  Clear.  Respiratory effort is not labored.  CARDIAC:  Normal S1 and S2.  There is a systolic murmur.  ABDOMEN:  Soft.  EXTREMITIES:  She has no significant peripheral edema.   LABORATORY DATA:  The echo as I described it on August 26, 2008.   PROBLEMS:  1. Hypothyroidism, treated.  2. Gastroesophageal reflux disease.  3. Mild dyslipidemia.  4. Borderline diabetes.  5. Left bundle-branch block.  6. Chronic systolic heart failure from a nonischemic cardiomyopathy      and ejection fraction remaining in the 25%-30% range.  7. Ejection fraction 25%-30% range.  8. Mitral regurgitation.   The current echo reading of her mitral regurgitation seems to sound more   marked than I had thought in the past.  Review of her catheterization  revealed that her MR could not be assessed fully in the cath lab.  A 2-D  echo on May 14, 2008, when she was first seen revealed that there  was moderate regurgitation, but it was not severe at that time.  There  was right ventricular dysfunction at that time.  Currently her right  ventricular function appears to be better.  It seems unlikely that  mitral regurgitation is the primary culprit here.  I will keep in mind  whether we should consider a transesophageal echo to assess her mitral  valve further.  Her left ventricular chamber size in fact may be  slightly smaller than at the time of the original echo.  This might  argue against remodeling and causing the mitral regurgitation.     Luis Abed, MD, [facility]  Electronically Signed    JDK/MedQ  DD: 09/02/2008  DT: 09/03/2008  Job #: [id number]   cc:   Luanna Cole. Lenord Fellers, M.D.

## 2011-02-03 ENCOUNTER — Encounter: Payer: Self-pay | Admitting: Internal Medicine

## 2011-02-09 ENCOUNTER — Ambulatory Visit (INDEPENDENT_AMBULATORY_CARE_PROVIDER_SITE_OTHER): Payer: BC Managed Care – PPO | Admitting: Internal Medicine

## 2011-02-09 ENCOUNTER — Encounter: Payer: Self-pay | Admitting: Internal Medicine

## 2011-02-09 DIAGNOSIS — I5022 Chronic systolic (congestive) heart failure: Secondary | ICD-10-CM

## 2011-02-09 DIAGNOSIS — Z95810 Presence of automatic (implantable) cardiac defibrillator: Secondary | ICD-10-CM

## 2011-02-09 DIAGNOSIS — I428 Other cardiomyopathies: Secondary | ICD-10-CM

## 2011-02-09 DIAGNOSIS — E663 Overweight: Secondary | ICD-10-CM

## 2011-02-09 NOTE — Assessment & Plan Note (Signed)
Her device is working normally. Will recheck in several months. 

## 2011-02-09 NOTE — Patient Instructions (Signed)
Your physician wants you to follow-up in: 12 months with Dr. Taylor. You will receive a reminder letter in the mail two months in advance. If you don't receive a letter, please call our office to schedule the follow-up appointment.    

## 2011-02-09 NOTE — Progress Notes (Signed)
HPI [name] returns today for followup. She is a pleasant 65 year old woman with a history of nonischemic cardiomyopathy, left bundle branch block, chronic systolic congestive heart failure, who is status post BIV ICD implantation. She has been stable for the last several months. She has not been hospitalized. Her congestive heart failure his class I last 2. She denies sodium or dietary indiscretion.No ICD shocks. Not on File   Current Outpatient Prescriptions  Medication Sig Dispense Refill  . aspirin 81 MG tablet One by mouth every other day       . carvedilol (COREG) 25 MG tablet Take 25 mg by mouth 2 (two) times daily with a meal.        . Chlorphen-Pseudoephed-APAP (TYLENOL ALLERGY SINUS PO) Take by mouth as needed.        . Cholecalciferol (VITAMIN D PO) Take by mouth daily.        . citalopram (CELEXA) 40 MG tablet Take 40 mg by mouth daily.        . furosemide (LASIX) 40 MG tablet Take 40 mg by mouth daily.        Marland Kitchen ibuprofen (ADVIL,MOTRIN) 200 MG tablet Take 200 mg by mouth every 6 (six) hours as needed.        Marland Kitchen levothyroxine (SYNTHROID) 112 MCG tablet Take 112 mcg by mouth daily.        . metFORMIN (GLUMETZA) 500 MG (MOD) 24 hr tablet Take 500 mg by mouth 2 (two) times daily.        Marland Kitchen omeprazole (PRILOSEC) 20 MG capsule Take 20 mg by mouth daily.        . ramipril (ALTACE) 2.5 MG capsule Take 2.5 mg by mouth daily.        . rosuvastatin (CRESTOR) 10 MG tablet Take 10 mg by mouth daily.        Marland Kitchen spironolactone (ALDACTONE) 25 MG tablet Take 25 mg by mouth daily.           Past Medical History  Diagnosis Date  . Nonischemic cardiomyopathy   . Systolic CHF, chronic   . LBBB (left bundle branch block)     old  . ICD (implantable cardiac defibrillator) in place     10/2009; Dr. Ladona Ridgel  . Hypothyroidism   . GERD (gastroesophageal reflux disease)   . Dyslipidemia   . Borderline diabetes   . Mitral regurgitation   . Overweight     ROS:   All systems reviewed and negative  except as noted in the HPI.   No past surgical history on file.   Family History  Problem Relation Age of Onset  . Heart attack Mother   . Lung cancer Father      History   Social History  . Marital Status: Single    Spouse Name: N/A    Number of Children: N/A  . Years of Education: N/A   Occupational History  . Not on file.   Social History Main Topics  . Smoking status: Never Smoker   . Smokeless tobacco: Not on file  . Alcohol Use: Yes     3 beers once a week  . Drug Use: No  . Sexually Active: Not on file   Other Topics Concern  . Not on file   Social History Narrative  . No narrative on file     BP 122/74  Pulse 71  Resp 16  Ht 5' (1.524 m)  Wt 195 lb (88.451 kg)  BMI 38.08 kg/m2  Physical  Exam:  Obese, well appearing NAD HEENT: Unremarkable Neck:  No JVD, no thyromegally Lymphatics:  No adenopathy Back:  No CVA tenderness Lungs:  Clear. Well-healed ICD incision. HEART:  Regular rate rhythm, no murmurs, no rubs, no clicks Abd:  obese positive bowel sounds, no organomegally, no rebound, no guarding Ext:  2 plus pulses, no edema, no cyanosis, no clubbing Skin:  No rashes no nodules Neuro:  CN II through XII intact, motor grossly intact  DEVICE  Normal device function.  See PaceArt for details.   Assess/Plan:

## 2011-02-09 NOTE — Assessment & Plan Note (Signed)
I have asked she increase her physical activity, and reduce her get dietary intake.

## 2011-02-09 NOTE — Assessment & Plan Note (Signed)
Her symptoms are well controlled. I've asked that she continue her low sodium diet. She will continue her current medications. She will continue to exercise regularly.

## 2011-02-13 ENCOUNTER — Other Ambulatory Visit: Payer: Self-pay | Admitting: Cardiology

## 2011-03-14 ENCOUNTER — Other Ambulatory Visit: Payer: Self-pay | Admitting: Cardiology

## 2011-03-25 ENCOUNTER — Ambulatory Visit (INDEPENDENT_AMBULATORY_CARE_PROVIDER_SITE_OTHER): Payer: Self-pay | Admitting: *Deleted

## 2011-03-25 DIAGNOSIS — I428 Other cardiomyopathies: Secondary | ICD-10-CM

## 2011-03-25 DIAGNOSIS — Z95810 Presence of automatic (implantable) cardiac defibrillator: Secondary | ICD-10-CM

## 2011-03-25 LAB — ICD DEVICE OBSERVATION
AL AMPLITUDE: 6.9 mv
AL THRESHOLD: 0.6 V
DEV-0020ICD: NEGATIVE
DEVICE MODEL ICD: 160860
RV LEAD AMPLITUDE: 17.2 mv
RV LEAD THRESHOLD: 0.6 V
TOT-0001: 1
TOT-0002: 0
TZAT-0001FASTVT: 1
TZAT-0001SLOWVT: 1
TZAT-0001SLOWVT: 2
TZAT-0004FASTVT: 10
TZAT-0004SLOWVT: 10
TZAT-0004SLOWVT: 9
TZAT-0005FASTVT: 81 pct
TZAT-0005SLOWVT: 81 pct
TZAT-0012FASTVT: 220 ms
TZAT-0012FASTVT: 220 ms
TZAT-0013FASTVT: 4
TZAT-0013SLOWVT: 4
TZAT-0018SLOWVT: NEGATIVE
TZAT-0020FASTVT: 1 ms
TZAT-0020SLOWVT: 1 ms
TZON-0004SLOWVT: 60
TZON-0005SLOWVT: 1
TZST-0001FASTVT: 3
TZST-0001FASTVT: 5
TZST-0001FASTVT: 6
TZST-0001FASTVT: 8
TZST-0001SLOWVT: 3
TZST-0001SLOWVT: 6
TZST-0001SLOWVT: 7
TZST-0003FASTVT: 41 J
TZST-0003FASTVT: 41 J
TZST-0003FASTVT: 41 J
TZST-0003SLOWVT: 41 J
TZST-0003SLOWVT: 41 J
TZST-0003SLOWVT: 41 J

## 2011-03-25 NOTE — Progress Notes (Signed)
ICD check by industry for research 

## 2011-04-07 ENCOUNTER — Encounter: Payer: Self-pay | Admitting: Internal Medicine

## 2011-04-08 ENCOUNTER — Encounter: Payer: Self-pay | Admitting: Internal Medicine

## 2011-04-08 ENCOUNTER — Other Ambulatory Visit (HOSPITAL_COMMUNITY)
Admission: RE | Admit: 2011-04-08 | Discharge: 2011-04-08 | Disposition: A | Discharge status: Home / Self Care | Payer: BC Managed Care – PPO | Source: Ambulatory Visit | Attending: Internal Medicine | Admitting: Internal Medicine

## 2011-04-08 ENCOUNTER — Ambulatory Visit (INDEPENDENT_AMBULATORY_CARE_PROVIDER_SITE_OTHER): Payer: BC Managed Care – PPO | Admitting: Internal Medicine

## 2011-04-08 VITALS — BP 96/54 | HR 80 | Temp 98.0°F | Ht 58.5 in | Wt 200.0 lb

## 2011-04-08 DIAGNOSIS — N390 Urinary tract infection, site not specified: Secondary | ICD-10-CM

## 2011-04-08 DIAGNOSIS — E039 Hypothyroidism, unspecified: Secondary | ICD-10-CM

## 2011-04-08 DIAGNOSIS — K219 Gastro-esophageal reflux disease without esophagitis: Secondary | ICD-10-CM

## 2011-04-08 DIAGNOSIS — F3289 Other specified depressive episodes: Secondary | ICD-10-CM

## 2011-04-08 DIAGNOSIS — Z0000 Encounter for general adult medical examination without abnormal findings: Secondary | ICD-10-CM

## 2011-04-08 DIAGNOSIS — Z23 Encounter for immunization: Secondary | ICD-10-CM

## 2011-04-08 DIAGNOSIS — I428 Other cardiomyopathies: Secondary | ICD-10-CM

## 2011-04-08 DIAGNOSIS — B9689 Other specified bacterial agents as the cause of diseases classified elsewhere: Secondary | ICD-10-CM

## 2011-04-08 DIAGNOSIS — J45909 Unspecified asthma, uncomplicated: Secondary | ICD-10-CM

## 2011-04-08 DIAGNOSIS — Z124 Encounter for screening for malignant neoplasm of cervix: Secondary | ICD-10-CM

## 2011-04-08 DIAGNOSIS — J309 Allergic rhinitis, unspecified: Secondary | ICD-10-CM

## 2011-04-08 DIAGNOSIS — A499 Bacterial infection, unspecified: Secondary | ICD-10-CM

## 2011-04-08 DIAGNOSIS — F329 Major depressive disorder, single episode, unspecified: Secondary | ICD-10-CM

## 2011-04-08 DIAGNOSIS — E119 Type 2 diabetes mellitus without complications: Secondary | ICD-10-CM

## 2011-04-08 DIAGNOSIS — Z01419 Encounter for gynecological examination (general) (routine) without abnormal findings: Secondary | ICD-10-CM | POA: Insufficient documentation

## 2011-04-08 LAB — POCT URINALYSIS DIPSTICK
Ketones, UA: NEGATIVE
Protein, UA: NEGATIVE
Spec Grav, UA: 1.02
Urobilinogen, UA: NEGATIVE
pH, UA: 6

## 2011-04-12 ENCOUNTER — Encounter: Payer: Self-pay | Admitting: Cardiology

## 2011-04-12 DIAGNOSIS — I340 Nonrheumatic mitral (valve) insufficiency: Secondary | ICD-10-CM | POA: Insufficient documentation

## 2011-04-12 DIAGNOSIS — I447 Left bundle-branch block, unspecified: Secondary | ICD-10-CM | POA: Insufficient documentation

## 2011-04-12 DIAGNOSIS — K219 Gastro-esophageal reflux disease without esophagitis: Secondary | ICD-10-CM | POA: Insufficient documentation

## 2011-04-12 DIAGNOSIS — Z95810 Presence of automatic (implantable) cardiac defibrillator: Secondary | ICD-10-CM | POA: Insufficient documentation

## 2011-04-12 DIAGNOSIS — I5022 Chronic systolic (congestive) heart failure: Secondary | ICD-10-CM | POA: Insufficient documentation

## 2011-04-12 DIAGNOSIS — R9430 Abnormal result of cardiovascular function study, unspecified: Secondary | ICD-10-CM | POA: Insufficient documentation

## 2011-04-12 DIAGNOSIS — E785 Hyperlipidemia, unspecified: Secondary | ICD-10-CM | POA: Insufficient documentation

## 2011-04-12 DIAGNOSIS — E039 Hypothyroidism, unspecified: Secondary | ICD-10-CM | POA: Insufficient documentation

## 2011-04-12 DIAGNOSIS — E663 Overweight: Secondary | ICD-10-CM | POA: Insufficient documentation

## 2011-04-13 ENCOUNTER — Encounter: Payer: Self-pay | Admitting: Cardiology

## 2011-04-13 ENCOUNTER — Ambulatory Visit (INDEPENDENT_AMBULATORY_CARE_PROVIDER_SITE_OTHER): Payer: BC Managed Care – PPO | Admitting: Cardiology

## 2011-04-13 DIAGNOSIS — Z95810 Presence of automatic (implantable) cardiac defibrillator: Secondary | ICD-10-CM

## 2011-04-13 DIAGNOSIS — I428 Other cardiomyopathies: Secondary | ICD-10-CM

## 2011-04-13 MED ORDER — FUROSEMIDE 40 MG PO TABS: 40.0000 mg | ORAL_TABLET | Freq: Every day | ORAL | Status: DC

## 2011-04-13 NOTE — Assessment & Plan Note (Signed)
ICD is in place and quite stable.

## 2011-04-13 NOTE — Assessment & Plan Note (Signed)
The patient is stable.  There is no need for further testing at this time.  I'll see her back in 9 months for followup.

## 2011-04-13 NOTE — Patient Instructions (Signed)
Your physician recommends that you schedule a follow-up appointment in: 9 months  

## 2011-04-13 NOTE — Progress Notes (Signed)
HPI Patient is seen for followup of cardiomyopathy.  I saw her last January, 2012.  She is on optimal medications.  She has an ICD in place.  Her cardiomyopathy is nonischemic.  She's not having chest pain.  She has not had an ICD discharge. No Known Allergies  Current Outpatient Prescriptions  Medication Sig Dispense Refill  . aspirin 81 MG tablet One by mouth every other day       . carvedilol (COREG) 25 MG tablet TAKE 1 TABLET BY MOUTH TWICE A DAY  60 tablet  6  . Chlorphen-Pseudoephed-APAP (TYLENOL ALLERGY SINUS PO) Take by mouth as needed.        . Cholecalciferol (VITAMIN D PO) Take by mouth daily.        . citalopram (CELEXA) 40 MG tablet Take 40 mg by mouth daily.        . furosemide (LASIX) 40 MG tablet Take 40 mg by mouth daily.        Marland Kitchen ibuprofen (ADVIL,MOTRIN) 200 MG tablet Take 200 mg by mouth every 6 (six) hours as needed.        Marland Kitchen levothyroxine (SYNTHROID, LEVOTHROID) 150 MCG tablet Take 150 mcg by mouth daily.        . metFORMIN (GLUMETZA) 500 MG (MOD) 24 hr tablet Take 500 mg by mouth 2 (two) times daily.        Marland Kitchen omeprazole (PRILOSEC) 20 MG capsule Take 20 mg by mouth daily.        . ramipril (ALTACE) 2.5 MG capsule Take 2.5 mg by mouth daily.        . rosuvastatin (CRESTOR) 10 MG tablet Take 10 mg by mouth daily.        Marland Kitchen spironolactone (ALDACTONE) 25 MG tablet TAKE 1 TABLET BY MOUTH EVERY DAY  30 tablet  10    History   Social History  . Marital Status: Single    Spouse Name: N/A    Number of Children: N/A  . Years of Education: N/A   Occupational History  . Not on file.   Social History Main Topics  . Smoking status: Never Smoker   . Smokeless tobacco: Not on file  . Alcohol Use: Yes     3 beers once a week  . Drug Use: No  . Sexually Active: Not on file   Other Topics Concern  . Not on file   Social History Narrative  . No narrative on file    Family History  Problem Relation Age of Onset  . Heart attack Mother   . Lung cancer Father     Past  Medical History  Diagnosis Date  . Nonischemic cardiomyopathy     Normal coronary arteries, catheterization, September, 2009  . Systolic CHF, chronic   . LBBB (left bundle branch block)     old  . ICD (implantable cardiac defibrillator) in place     10/2009; Dr. Ladona Ridgel  . Hypothyroidism   . GERD (gastroesophageal reflux disease)   . Dyslipidemia   . Borderline diabetes   . Mitral regurgitation     Mild, echo, September, 2010  . Overweight   . Ejection fraction < 50%     EF 25-30%, September, 2010    No past surgical history on file.  ROS  Patient denies fever, chills, headache, sweats, rash, change in vision, change in hearing, chest pain, cough, nausea vomiting, urinary symptoms.  All other systems are reviewed and are negative.  PHYSICAL EXAM Patient is overweight.  She  is stable.  She is oriented to person time and place.  Affect is normal.  Lungs are clear respiratory effort is nonlabored.  Cardiac exam reveals S1-S2.  No clicks or significant murmurs.  The abdomen is soft.  There is no peripheral edema. Filed Vitals:   04/13/11 1503  BP: 104/72  Pulse: 72  Height: 5' (1.524 m)  Weight: 201 lb (91.173 kg)    EKG Is done today and reviewed by me.  She has left bundle branch block.  This is old and unchanged.  ASSESSMENT & PLAN

## 2011-04-14 NOTE — Progress Notes (Signed)
  Subjective:    Patient ID: [name], female    DOB: 08-16-1946, 65 y.o.   MRN: [id number]  HPI 65 year old white female patient employed at [employer] with history of hypothyroidism, allergic rhinitis, GE reflux, fibrocystic breast disease, adult onset diabetes mellitus, chronic systolic congestive heart failure diagnosed 2009, hypertension. History of depression 2001. History of Achilles tendinitis 1998. Recurrent urinary tract infections. Multiple eye surgeries in childhood. Tonsillectomy at age 31. She has a living will . Has declined colonoscopy. Last mammogram February 2012. Ophthalmologist Dr. Elmer Picker with eye exam February 2011. Pneumovax 2008, TDap July 2011, influenza vaccine October 2011. Patient received ICD implantation with defibrillation threshold testing February 2011. Long-standing history of nonischemic cardiomyopathy with class II heart failure. Ejection fraction 30%. Is on maximal medical therapy. ICD inserted as a prophylactic. History of left bundle branch block. Contusion of right forehead presented to emergency department April 2011. Cardiac catheterization 2009 showing severe global left ventricular dysfunction with ejection  fraction of 30% no significant coronary artery stenoses.    Review of Systems  Constitutional: Negative.   HENT: Negative.   Eyes: Negative.   Respiratory: Positive for shortness of breath.   Cardiovascular: Negative for chest pain, palpitations and leg swelling.  Gastrointestinal: Negative.   Genitourinary: Negative.   Musculoskeletal: Negative.   Neurological: Negative.   Hematological: Negative.   Psychiatric/Behavioral: Positive for dysphoric mood.       Objective:   Physical Exam  Constitutional: She is oriented to person, place, and time. She appears well-nourished.  HENT:  Head: Normocephalic and atraumatic.  Right Ear: External ear normal.  Left Ear: External ear normal.  Mouth/Throat: No oropharyngeal exudate.  Eyes:  Pupils are equal, round, and reactive to light. No scleral icterus.  Neck: Neck supple. No JVD present. No thyromegaly present.  Cardiovascular: Normal rate, regular rhythm and normal heart sounds.   Pulmonary/Chest: Effort normal and breath sounds normal. She has no wheezes. She has no rales.  Abdominal: Soft. Bowel sounds are normal. She exhibits no distension and no mass. There is no tenderness.  Musculoskeletal: Normal range of motion. She exhibits no edema.  Lymphadenopathy:    She has no cervical adenopathy.  Neurological: She is alert and oriented to person, place, and time. She has normal reflexes.  Skin: Skin is warm and dry.  Psychiatric: Judgment and thought content normal.          Assessment & Plan:  Nonischemic idiopathic cardiomyopathy status post insertion a prophylactic ICD with estimated ejection fraction of 30%.  Hypothyroidism  Hypertension  Diabetes mellitus  GE reflux  Allergic rhinitis  History of right carpal tunnel syndrome  Her current. Tract infections  Plan patient is to return in 6 months for office visit and TSH, lipid panel and liver functions. Continue same medications. Declines colonoscopy. Needs yearly eye exam with Dr. Elmer Picker. Last February 2011.

## 2011-05-13 ENCOUNTER — Encounter: Payer: BC Managed Care – PPO | Admitting: *Deleted

## 2011-05-19 ENCOUNTER — Encounter: Payer: BC Managed Care – PPO | Admitting: *Deleted

## 2011-06-06 ENCOUNTER — Other Ambulatory Visit: Payer: Self-pay | Admitting: Internal Medicine

## 2011-06-16 ENCOUNTER — Other Ambulatory Visit: Payer: Self-pay | Admitting: Internal Medicine

## 2011-06-16 LAB — GLUCOSE, CAPILLARY
Glucose-Capillary: 105 — ABNORMAL HIGH
Glucose-Capillary: 105 — ABNORMAL HIGH
Glucose-Capillary: 114 — ABNORMAL HIGH
Glucose-Capillary: 114 — ABNORMAL HIGH
Glucose-Capillary: 126 — ABNORMAL HIGH
Glucose-Capillary: 128 — ABNORMAL HIGH
Glucose-Capillary: 75

## 2011-06-16 LAB — CBC
HCT: 35.7 — ABNORMAL LOW
HCT: 38.8
MCHC: 32.9
MCV: 94.9
MCV: 95.5
Platelets: 250
Platelets: 267
RBC: 3.77 — ABNORMAL LOW
RDW: 14.4
WBC: 5.5
WBC: 6

## 2011-06-16 LAB — POCT I-STAT 3, ART BLOOD GAS (G3+)
Acid-Base Excess: 3 — ABNORMAL HIGH
O2 Saturation: 92
pO2, Arterial: 70 — ABNORMAL LOW

## 2011-06-16 LAB — POCT I-STAT 3, VENOUS BLOOD GAS (G3P V)
Acid-Base Excess: 4 — ABNORMAL HIGH
O2 Saturation: 67
TCO2: 32

## 2011-06-16 LAB — CARDIAC PANEL(CRET KIN+CKTOT+MB+TROPI)
CK, MB: 2.3
CK, MB: 2.6
Troponin I: 0.04

## 2011-06-16 LAB — BASIC METABOLIC PANEL
BUN: 13
BUN: 20
BUN: 20
CO2: 29
Calcium: 8.6
Chloride: 101
Chloride: 109
Creatinine, Ser: 0.82
Creatinine, Ser: 0.92
GFR calc Af Amer: >60
GFR calc non Af Amer: >60
GFR calc non Af Amer: >60
Glucose, Bld: 92
Potassium: 3.6
Potassium: 4

## 2011-06-16 LAB — LIPID PANEL
Cholesterol: 139
HDL: 28 — ABNORMAL LOW
Total CHOL/HDL Ratio: 5
Triglycerides: 55

## 2011-06-24 ENCOUNTER — Encounter: Payer: BC Managed Care – PPO | Admitting: *Deleted

## 2011-06-28 ENCOUNTER — Encounter: Payer: Self-pay | Admitting: *Deleted

## 2011-10-11 ENCOUNTER — Encounter: Payer: Self-pay | Admitting: Internal Medicine

## 2011-10-11 ENCOUNTER — Ambulatory Visit (INDEPENDENT_AMBULATORY_CARE_PROVIDER_SITE_OTHER): Payer: BC Managed Care – PPO | Admitting: Internal Medicine

## 2011-10-11 VITALS — BP 106/68 | HR 76 | Temp 97.9°F | Wt 200.0 lb

## 2011-10-11 DIAGNOSIS — I5022 Chronic systolic (congestive) heart failure: Secondary | ICD-10-CM

## 2011-10-11 DIAGNOSIS — I10 Essential (primary) hypertension: Secondary | ICD-10-CM

## 2011-10-11 DIAGNOSIS — E1169 Type 2 diabetes mellitus with other specified complication: Secondary | ICD-10-CM

## 2011-10-11 DIAGNOSIS — E669 Obesity, unspecified: Secondary | ICD-10-CM

## 2011-10-11 DIAGNOSIS — E119 Type 2 diabetes mellitus without complications: Secondary | ICD-10-CM

## 2011-10-11 DIAGNOSIS — Z87898 Personal history of other specified conditions: Secondary | ICD-10-CM

## 2011-10-11 DIAGNOSIS — Z8709 Personal history of other diseases of the respiratory system: Secondary | ICD-10-CM

## 2011-10-11 DIAGNOSIS — E039 Hypothyroidism, unspecified: Secondary | ICD-10-CM

## 2011-10-11 DIAGNOSIS — J309 Allergic rhinitis, unspecified: Secondary | ICD-10-CM

## 2011-10-11 DIAGNOSIS — I509 Heart failure, unspecified: Secondary | ICD-10-CM

## 2011-10-13 ENCOUNTER — Other Ambulatory Visit: Payer: Self-pay | Admitting: Internal Medicine

## 2011-10-13 ENCOUNTER — Other Ambulatory Visit: Payer: Self-pay | Admitting: *Deleted

## 2011-10-13 MED ORDER — CARVEDILOL 25 MG PO TABS: 25.0000 mg | ORAL_TABLET | Freq: Two times a day (BID) | ORAL | Status: DC

## 2011-11-17 NOTE — Progress Notes (Signed)
  Subjective:    Patient ID: [name], female    DOB: 04/17/1946, 66 y.o.   MRN: [id number]  HPI 66 year old white female in today for six-month recheck. History of hypothyroidism, GE reflux, fibrocystic breast disease, adult-onset diabetes mellitus type 2 associated with obesity, chronic systolic congestive heart failure diagnosed in 2009, hypertension. She also has a history of asthma. Has declined colonoscopy. Had recent lab work done through employer October 2012 showing normal lipid panel. Patient is followed by Dr. Myrtis Ser at [facility].    Review of Systems     Objective:   Physical Exam no carotid bruits, no JVD. Chest clear; cardiac exam regular rate and rhythm; extremities without edema        Assessment & Plan:  Hypertension  Chronic systolic heart failure-stable  Diabetes mellitus type 2  GE reflux  Fibrocystic breast disease  History of asthma.  Hypothyroidism  Plan: Patient is return in 6 months for physical exam. Lipid panel, liver functions, TSH, hemoglobin A1c drawn

## 2011-12-07 ENCOUNTER — Other Ambulatory Visit: Payer: Self-pay | Admitting: Internal Medicine

## 2011-12-12 NOTE — Patient Instructions (Signed)
Continue same medications and return in 6 months for physical exam. Remember to get annual diabetic eye exam. Remember annual mammogram.

## 2012-01-17 ENCOUNTER — Other Ambulatory Visit: Payer: Self-pay | Admitting: Cardiology

## 2012-01-26 ENCOUNTER — Ambulatory Visit (INDEPENDENT_AMBULATORY_CARE_PROVIDER_SITE_OTHER): Payer: Medicare Other | Admitting: Internal Medicine

## 2012-01-26 ENCOUNTER — Encounter: Payer: Self-pay | Admitting: Internal Medicine

## 2012-01-26 VITALS — BP 100/72 | HR 68 | Ht 60.0 in | Wt 202.0 lb

## 2012-01-26 DIAGNOSIS — I509 Heart failure, unspecified: Secondary | ICD-10-CM

## 2012-01-26 DIAGNOSIS — Z95810 Presence of automatic (implantable) cardiac defibrillator: Secondary | ICD-10-CM

## 2012-01-26 DIAGNOSIS — I428 Other cardiomyopathies: Secondary | ICD-10-CM

## 2012-01-26 DIAGNOSIS — I5022 Chronic systolic (congestive) heart failure: Secondary | ICD-10-CM

## 2012-01-26 LAB — ICD DEVICE OBSERVATION
AL IMPEDENCE ICD: 583 Ohm
AL THRESHOLD: 0.6 V
BAMS-0003: 70 {beats}/min
DEVICE MODEL ICD: 160860
RV LEAD AMPLITUDE: 21.1 mv
TZAT-0004FASTVT: 10
TZAT-0004SLOWVT: 10
TZAT-0005FASTVT: 81 pct
TZAT-0005FASTVT: 81 pct
TZAT-0005SLOWVT: 81 pct
TZAT-0005SLOWVT: 81 pct
TZAT-0012FASTVT: 220 ms
TZAT-0012FASTVT: 220 ms
TZAT-0012SLOWVT: 200 ms
TZAT-0012SLOWVT: 220 ms
TZAT-0013FASTVT: 2
TZAT-0013FASTVT: 4
TZAT-0013SLOWVT: 4
TZAT-0018FASTVT: NEGATIVE
TZAT-0018FASTVT: NEGATIVE
TZAT-0019FASTVT: 5 V
TZON-0003SLOWVT: 353 ms
TZST-0001FASTVT: 4
TZST-0001FASTVT: 7
TZST-0001FASTVT: 8
TZST-0001SLOWVT: 6
TZST-0003FASTVT: 41 J
TZST-0003FASTVT: 41 J
TZST-0003FASTVT: 41 J
TZST-0003FASTVT: 41 J
TZST-0003SLOWVT: 41 J
TZST-0003SLOWVT: 41 J
TZST-0003SLOWVT: 41 J

## 2012-01-26 NOTE — Assessment & Plan Note (Signed)
Her symptoms are still class II despite severe left ventricular dysfunction. She'll continue her current medical therapy, and maintain a low-sodium diet.

## 2012-01-26 NOTE — Assessment & Plan Note (Signed)
Her device is working normally. We'll plan recheck in several months.

## 2012-01-26 NOTE — Patient Instructions (Addendum)
Your physician recommends that you schedule a follow-up appointment in: 3 months with device clinic.   Your physician wants you to follow-up in: 1 year with Dr. Ladona Ridgel. You will receive a reminder letter in the mail two months in advance. If you don't receive a letter, please call our office to schedule the follow-up appointment.  Your physician recommends that you continue on your current medications as directed. Please refer to the Current Medication list given to you today.

## 2012-01-26 NOTE — Progress Notes (Signed)
HPI [name] returns today for followup. She is a very pleasant 66 year old woman with a chronic systolic heart failure and a nonischemic cardiomyopathy. She is status post dual-chamber ICD implantation. She denies chest pain, shortness of breath, or syncope. No ICD shocks. In the interim, she is joined the [employer]. No Known Allergies   Current Outpatient Prescriptions  Medication Sig Dispense Refill  . aspirin 81 MG tablet One by mouth every other day       . carvedilol (COREG) 25 MG tablet Take 1 tablet (25 mg total) by mouth 2 (two) times daily with a meal.  60 tablet  6  . Chlorphen-Pseudoephed-APAP (TYLENOL ALLERGY SINUS PO) Take by mouth as needed.        . Cholecalciferol (VITAMIN D PO) Take by mouth daily.        . citalopram (CELEXA) 40 MG tablet TAKE 1 TABLET BY MOUTH EVERY DAY  30 tablet  6  . CRESTOR 10 MG tablet TAKE 1 TABLET EVERY DAY  30 tablet  3  . furosemide (LASIX) 40 MG tablet Take 1 tablet (40 mg total) by mouth daily.  30 tablet  11  . ibuprofen (ADVIL,MOTRIN) 200 MG tablet Take 200 mg by mouth every 6 (six) hours as needed.        . metFORMIN (GLUMETZA) 500 MG (MOD) 24 hr tablet Take 500 mg by mouth 2 (two) times daily.        Marland Kitchen omeprazole (PRILOSEC) 20 MG capsule Take 20 mg by mouth daily.        . ramipril (ALTACE) 2.5 MG capsule TAKE 1 CAPSULE EVERY DAY  30 capsule  12  . spironolactone (ALDACTONE) 25 MG tablet TAKE 1 TABLET BY MOUTH EVERY DAY  30 tablet  10  . SYNTHROID 150 MCG tablet TAKE 1 TABLET BY MOUTH EVERY DAY  30 tablet  5     Past Medical History  Diagnosis Date  . Nonischemic cardiomyopathy     Normal coronary arteries, catheterization, September, 2009  . Systolic CHF, chronic   . LBBB (left bundle branch block)     old  . ICD (implantable cardiac defibrillator) in place     10/2009; Dr. Ladona Ridgel  . Hypothyroidism   . GERD (gastroesophageal reflux disease)   . Dyslipidemia   . Borderline diabetes   . Mitral regurgitation     Mild, echo,  September, 2010  . Overweight   . Ejection fraction < 50%     EF 25-30%, September, 2010    ROS:   All systems reviewed and negative except as noted in the HPI.   No past surgical history on file.   Family History  Problem Relation Age of Onset  . Heart attack Mother   . Lung cancer Father      History   Social History  . Marital Status: Single    Spouse Name: N/A    Number of Children: N/A  . Years of Education: N/A   Occupational History  . Not on file.   Social History Main Topics  . Smoking status: Never Smoker   . Smokeless tobacco: Not on file  . Alcohol Use: Yes     3 beers once a week  . Drug Use: No  . Sexually Active: Not on file   Other Topics Concern  . Not on file   Social History Narrative  . No narrative on file     BP 100/72  Pulse 68  Ht 5' (1.524 m)  Wt  202 lb (91.627 kg)  BMI 39.45 kg/m2  Physical Exam:  Well appearing obese, NAD HEENT: Unremarkable Neck:  No JVD, no thyromegally Lungs:  Clear with no wheezes, rales, or rhonchi. HEART:  Regular rate rhythm, no murmurs, no rubs, no clicks Abd:  soft, positive bowel sounds, no organomegally, no rebound, no guarding Ext:  2 plus pulses, no edema, no cyanosis, no clubbing Skin:  No rashes no nodules Neuro:  CN II through XII intact, motor grossly intact  DEVICE  Normal device function.  See PaceArt for details.   Assess/Plan:

## 2012-02-10 ENCOUNTER — Other Ambulatory Visit: Payer: Self-pay | Admitting: Cardiology

## 2012-02-14 ENCOUNTER — Other Ambulatory Visit: Payer: Self-pay | Admitting: Internal Medicine

## 2012-02-14 NOTE — Telephone Encounter (Signed)
Patient came in today saying she was going on vacation and can no longer obtain omeprazole from her former employer since she has retired. Needs refill on omeprazole 20 mg. Given #90 with when necessary 1 year refills by written prescription today.

## 2012-03-22 DIAGNOSIS — L408 Other psoriasis: Secondary | ICD-10-CM | POA: Diagnosis not present

## 2012-03-22 DIAGNOSIS — L719 Rosacea, unspecified: Secondary | ICD-10-CM | POA: Diagnosis not present

## 2012-03-22 DIAGNOSIS — L570 Actinic keratosis: Secondary | ICD-10-CM | POA: Diagnosis not present

## 2012-04-10 ENCOUNTER — Other Ambulatory Visit: Payer: PRIVATE HEALTH INSURANCE | Admitting: Internal Medicine

## 2012-04-10 DIAGNOSIS — I509 Heart failure, unspecified: Secondary | ICD-10-CM

## 2012-04-10 DIAGNOSIS — E119 Type 2 diabetes mellitus without complications: Secondary | ICD-10-CM

## 2012-04-10 DIAGNOSIS — E785 Hyperlipidemia, unspecified: Secondary | ICD-10-CM | POA: Diagnosis not present

## 2012-04-10 DIAGNOSIS — E039 Hypothyroidism, unspecified: Secondary | ICD-10-CM | POA: Diagnosis not present

## 2012-04-10 LAB — CBC WITH DIFFERENTIAL/PLATELET
Eosinophils Relative: 5 % (ref 0–5)
HCT: 39.5 % (ref 36.0–46.0)
Lymphocytes Relative: 44 % (ref 12–46)
Lymphs Abs: 2.2 10*3/uL (ref 0.7–4.0)
MCV: 96.1 fL (ref 78.0–100.0)
Neutro Abs: 2.2 10*3/uL (ref 1.7–7.7)
Platelets: 290 10*3/uL (ref 150–400)
RBC: 4.11 MIL/uL (ref 3.87–5.11)
WBC: 5 10*3/uL (ref 4.0–10.5)

## 2012-04-10 LAB — COMPREHENSIVE METABOLIC PANEL
ALT: 14 U/L (ref 0–35)
AST: 17 U/L (ref 0–37)
Albumin: 4.3 g/dL (ref 3.5–5.2)
CO2: 31 mEq/L (ref 19–32)
Calcium: 9.3 mg/dL (ref 8.4–10.5)
Chloride: 100 mEq/L (ref 96–112)
Creat: 0.77 mg/dL (ref 0.50–1.10)
Potassium: 4.2 mEq/L (ref 3.5–5.3)
Sodium: 141 mEq/L (ref 135–145)
Total Protein: 6.7 g/dL (ref 6.0–8.3)

## 2012-04-10 LAB — HEMOGLOBIN A1C
Hgb A1c MFr Bld: 6.2 % — ABNORMAL HIGH (ref <5.7)
Mean Plasma Glucose: 131 mg/dL — ABNORMAL HIGH (ref <117)

## 2012-04-10 LAB — TSH: TSH: 0.134 u[IU]/mL — ABNORMAL LOW (ref 0.350–4.500)

## 2012-04-10 LAB — LIPID PANEL: Cholesterol: 184 mg/dL (ref 0–200)

## 2012-04-11 ENCOUNTER — Encounter: Payer: Self-pay | Admitting: Internal Medicine

## 2012-04-11 ENCOUNTER — Ambulatory Visit (INDEPENDENT_AMBULATORY_CARE_PROVIDER_SITE_OTHER): Payer: Medicare Other | Admitting: Internal Medicine

## 2012-04-11 VITALS — BP 94/62 | HR 68 | Temp 98.5°F | Ht 59.25 in | Wt 209.0 lb

## 2012-04-11 DIAGNOSIS — Z0000 Encounter for general adult medical examination without abnormal findings: Secondary | ICD-10-CM

## 2012-04-11 DIAGNOSIS — E119 Type 2 diabetes mellitus without complications: Secondary | ICD-10-CM

## 2012-04-11 DIAGNOSIS — Z8659 Personal history of other mental and behavioral disorders: Secondary | ICD-10-CM

## 2012-04-11 DIAGNOSIS — E669 Obesity, unspecified: Secondary | ICD-10-CM

## 2012-04-11 DIAGNOSIS — Z139 Encounter for screening, unspecified: Secondary | ICD-10-CM | POA: Diagnosis not present

## 2012-04-11 DIAGNOSIS — K219 Gastro-esophageal reflux disease without esophagitis: Secondary | ICD-10-CM | POA: Diagnosis not present

## 2012-04-11 DIAGNOSIS — E039 Hypothyroidism, unspecified: Secondary | ICD-10-CM

## 2012-04-11 DIAGNOSIS — I428 Other cardiomyopathies: Secondary | ICD-10-CM

## 2012-04-11 DIAGNOSIS — J309 Allergic rhinitis, unspecified: Secondary | ICD-10-CM | POA: Diagnosis not present

## 2012-04-11 DIAGNOSIS — E785 Hyperlipidemia, unspecified: Secondary | ICD-10-CM

## 2012-04-11 DIAGNOSIS — Z8679 Personal history of other diseases of the circulatory system: Secondary | ICD-10-CM

## 2012-04-11 DIAGNOSIS — Z95810 Presence of automatic (implantable) cardiac defibrillator: Secondary | ICD-10-CM

## 2012-04-11 DIAGNOSIS — I10 Essential (primary) hypertension: Secondary | ICD-10-CM

## 2012-04-11 LAB — POCT URINALYSIS DIPSTICK
Leukocytes, UA: NEGATIVE
Protein, UA: NEGATIVE
Urobilinogen, UA: NEGATIVE
pH, UA: 5.5

## 2012-04-11 LAB — VITAMIN D 25 HYDROXY (VIT D DEFICIENCY, FRACTURES): Vit D, 25-Hydroxy: 40 ng/mL (ref 30–89)

## 2012-04-11 NOTE — Progress Notes (Signed)
Subjective:    Patient ID: [name], female    DOB: 1945/09/14, 66 y.o.   MRN: [id number]   HPI 66 year old white female in today for health maintenance and evaluation of medical problems. History of hyperlipidemia, hypertension, GE reflux, hypothyroidism, mitral regurgitation, nonischemic cardiomyopathy, obesity, left bundle branch block, implantable cardiac defibrillator, type 2 diabetes mellitus. Patient is followed by [facility]. Recent lab work shows hemoglobin A1c 6.2% with mean plasma glucose of 131. TSH is low at 0.134 but she feels well on current dose of thyroid replacement. Vitamin D level is normal at 40. TMs normal. Lipid panel is normal with the exception of an LDL cholesterol of 105. Have encouraged diet and exercise but I don't think she really is unable to exercise very much because of cardiomyopathy.  Patient does not smoke. Does drink beer on occasion. She is retired and formerly worked at [employer] and prior to that at [employer].  History of allergic rhinitis and occasionally gets acute bronchospasm.  Additional past medical history: History of depression, dislocated right shoulder December 2001, multiple eye surgeries as a child, tonsillectomy at age 44  No known drug allergies. Has a living will.  Ophthalmologist is Dr. Elmer Picker. Recommend annual eye exam.  Patient has declined colonoscopy. 3 Hemoccult cards given.  Had Pneumovax 03/14/2007, tetanus immunization 04/07/2010. Had mammogram February 2012.  Patient is single and completed 4 years of college.  Family history: Other died of lung cancer in his 53s. Mother died at age 81 of an MI with history of possible diabetes mellitus. No siblings. No children. Subjective:   Patient presents for Medicare Annual/Subsequent preventive examination.   Review Past Medical/Family/Social: See review below in EPIC   Risk Factors  Current exercise habits:walk for exercise  Dietary  issues discussed: discussed calorie restricted diet- recommended 1500 calorie low fat low carb  Cardiac risk factors: Hyperlipidemia, HTN, DM, obesity, family history  Depression Screen  (Note: if answer to either of the following is "Yes", a more complete depression screening is indicated)   Over the past two weeks, have you felt down, depressed or hopeless? No  Over the past two weeks, have you felt little interest or pleasure in doing things? No Have you lost interest or pleasure in daily life? No Do you often feel hopeless? No Do you cry easily over simple problems? No   Activities of Daily Living  In your present state of health, do you have any difficulty performing the following activities?:   Driving? No  Managing money? No  Feeding yourself? No  Getting from bed to chair? No  Climbing a flight of stairs? Get SOB Preparing food and eating?: No  Bathing or showering? No  Getting dressed: No  Getting to the toilet? No  Using the toilet:No  Moving around from place to place: No as long as not climbing stairs gets SOB with exertion from CHF In the past year have you fallen or had a near fall?:No  Are you sexually active? No  Do you have more than one partner? No   Hearing Difficulties: No  Do you often ask people to speak up or repeat themselves? No  Do you experience ringing or noises in your ears? No  Do you have difficulty understanding soft or whispered voices? No  Do you feel that you have a problem with memory? No Do you often misplace items? No    Home Safety:  Do you have a smoke alarm at your residence?  No Do you have grab bars in the bathroom?No Do you have throw rugs in your house? No   Cognitive Testing  Alert? Yes Normal Appearance?Yes  Oriented to person? Yes Place? Yes  Time? Yes  Recall of three objects? Yes  Can perform simple calculations? Yes  Displays appropriate judgment?Yes  Can read the correct time from a watch face?Yes   List the  Names of Other Physician/Practitioners you currently use:  See referral list for the physicians patient is currently seeing. Dr. Myrtis Ser and Dr. Ladona Ridgel    Review of Systems: see Epic    Objective:     General appearance: Appears stated age and mildly obese  Head: Normocephalic, without obvious abnormality, atraumatic  Eyes: conj clear, EOMi PEERLA  Ears: normal TM's and external ear canals both ears  Nose: Nares normal. Septum midline. Mucosa normal. No drainage or sinus tenderness.  Throat: lips, mucosa, and tongue normal; teeth and gums normal  Neck: no adenopathy, no carotid bruit, no JVD, supple, symmetrical, trachea midline and thyroid not enlarged, symmetric, no tenderness/mass/nodules  No CVA tenderness.  Lungs: clear to auscultation bilaterally  Breasts: normal appearance, no masses or tenderness,  Heart: regular rate and rhythm, S1, S2 normal, no murmur, click, rub or gallop  Abdomen: soft, non-tender; bowel sounds normal; no masses, no organomegaly  Musculoskeletal: ROM normal in all joints, no crepitus, no deformity, Normal muscle strengthen. Back  is symmetric, no curvature. Skin: Skin color, texture, turgor normal. No rashes or lesions  Lymph nodes: Cervical, supraclavicular, and axillary nodes normal.  Neurologic: CN 2 -12 Normal, Normal symmetric reflexes. Normal coordination and gait  Psych: Alert & Oriented x 3, Mood appear stable.    Assessment:    Annual wellness medicare exam   Plan:    During the course of the visit the patient was educated and counseled about appropriate screening and preventive services including:   Mammogram annually, Bone density q 2 years.     Patient Instructions (the written plan) was given to the patient.  Medicare Attestation  I have personally reviewed:  The patient's medical and social history  Their use of alcohol, tobacco or illicit drugs  Their current medications and supplements  The patient's functional ability  including ADLs,fall risks, home safety risks, cognitive, and hearing and visual impairment  Diet and physical activities  Evidence for depression or mood disorders  The patient's weight, height, BMI, and visual acuity have been recorded in the chart. I have made referrals, counseling, and provided education to the patient based on review of the above and I have provided the patient with a written personalized care plan for preventive services.         Review of Systems  Constitutional: Positive for fatigue.  HENT: Positive for congestion and rhinorrhea.   Eyes: Negative.   Respiratory: Positive for cough.        SOB with exertion  Cardiovascular: Negative for chest pain, palpitations and leg swelling.  Genitourinary: Negative.  Negative for difficulty urinating.  Musculoskeletal: Positive for back pain.       Bilateral knee hip and back pain  Neurological: Positive for headaches.       Frequent headaches after eating of after using i-pad  Hematological: Negative.   Psychiatric/Behavioral: Negative.        Objective:   Physical Exam  Vitals reviewed. Constitutional: She is oriented to person, place, and time. She appears well-developed and well-nourished. No distress.  HENT:  Head: Normocephalic and atraumatic.  Right Ear:  External ear normal.  Left Ear: External ear normal.  Mouth/Throat: Oropharynx is clear and moist. No oropharyngeal exudate.  Eyes: Conjunctivae normal and EOM are normal. Pupils are equal, round, and reactive to light. Right eye exhibits no discharge. Left eye exhibits no discharge. No scleral icterus.  Neck: Neck supple. No JVD present. No thyromegaly present.  Cardiovascular: Normal rate, regular rhythm, normal heart sounds and intact distal pulses.   No murmur heard. Pulmonary/Chest: Effort normal and breath sounds normal. No respiratory distress. She has no wheezes. She has no rales. She exhibits no tenderness.  Abdominal: Soft. Bowel sounds are  normal. She exhibits no distension and no mass. There is no tenderness. There is no rebound and no guarding.  Musculoskeletal: Normal range of motion. She exhibits no edema.  Lymphadenopathy:    She has no cervical adenopathy.  Neurological: She is alert and oriented to person, place, and time. She has normal reflexes. No cranial nerve deficit. Coordination normal.  Skin: Skin is warm and dry. No rash noted. She is not diaphoretic.  Psychiatric: She has a normal mood and affect. Her behavior is normal. Judgment and thought content normal.          Assessment & Plan:  Hypothyroidism-TSH is low but we'll continue with same dose of thyroid replacement reassess in 6 months  Allergic rhinitis-stable  GE reflux-stable  Adult onset diabetes mellitus-stable with hemoglobin A1c 6.2% recommend diet and exercise  History of implantable cardiac defibrillator for nonischemic cardiomyopathy  Nonischemic cardiomyopathy-treated by cardiologist  Mitral regurgitation  Left bundle-branch block  Hyperlipidemia-stable on current regimen  Hypertension-stable current regimen  Obesity  Plan: Return in 6 months for office visit lipid panel liver functions and TSH. Patient refuses colonoscopy. Given 3 Hemoccult cards. Recommend annual mammogram. Recommend bone density study every 2 years.

## 2012-04-27 ENCOUNTER — Encounter: Payer: Self-pay | Admitting: Internal Medicine

## 2012-04-27 ENCOUNTER — Ambulatory Visit (INDEPENDENT_AMBULATORY_CARE_PROVIDER_SITE_OTHER): Payer: PRIVATE HEALTH INSURANCE | Admitting: *Deleted

## 2012-04-27 DIAGNOSIS — I428 Other cardiomyopathies: Secondary | ICD-10-CM | POA: Diagnosis not present

## 2012-04-27 LAB — ICD DEVICE OBSERVATION
AL IMPEDENCE ICD: 634 Ohm
BAMS-0003: 70 {beats}/min
DEVICE MODEL ICD: 160860
HV IMPEDENCE: 60 Ohm
RV LEAD IMPEDENCE ICD: 539 Ohm
TZAT-0001FASTVT: 1
TZAT-0001FASTVT: 2
TZAT-0005FASTVT: 81 pct
TZAT-0005SLOWVT: 81 pct
TZAT-0012SLOWVT: 200 ms
TZAT-0012SLOWVT: 220 ms
TZAT-0013FASTVT: 2
TZAT-0013SLOWVT: 4
TZAT-0013SLOWVT: 4
TZAT-0018FASTVT: NEGATIVE
TZAT-0018FASTVT: NEGATIVE
TZAT-0018SLOWVT: NEGATIVE
TZAT-0018SLOWVT: NEGATIVE
TZAT-0019FASTVT: 5 V
TZAT-0019FASTVT: 5 V
TZAT-0019SLOWVT: 5 V
TZAT-0019SLOWVT: 5 V
TZAT-0020FASTVT: 1 ms
TZAT-0020SLOWVT: 1 ms
TZAT-0020SLOWVT: 1 ms
TZON-0003FASTVT: 300 ms
TZON-0003SLOWVT: 353 ms
TZON-0004FASTVT: 12
TZON-0004SLOWVT: 60
TZON-0005FASTVT: 1
TZST-0001FASTVT: 4
TZST-0001FASTVT: 5
TZST-0001FASTVT: 7
TZST-0001SLOWVT: 5
TZST-0003FASTVT: 41 J
TZST-0003FASTVT: 41 J
TZST-0003FASTVT: 41 J
TZST-0003FASTVT: 41 J
TZST-0003SLOWVT: 41 J
TZST-0003SLOWVT: 41 J
VENTRICULAR PACING ICD: 1 pct

## 2012-04-27 NOTE — Progress Notes (Signed)
DEFIB CHECK IN CLINIC

## 2012-05-14 ENCOUNTER — Other Ambulatory Visit: Payer: Self-pay | Admitting: Cardiology

## 2012-06-13 ENCOUNTER — Ambulatory Visit (INDEPENDENT_AMBULATORY_CARE_PROVIDER_SITE_OTHER): Payer: Medicare Other | Admitting: Cardiology

## 2012-06-13 ENCOUNTER — Encounter: Payer: Self-pay | Admitting: Cardiology

## 2012-06-13 VITALS — BP 90/62 | HR 72 | Ht 60.0 in | Wt 213.8 lb

## 2012-06-13 DIAGNOSIS — E663 Overweight: Secondary | ICD-10-CM | POA: Diagnosis not present

## 2012-06-13 DIAGNOSIS — I509 Heart failure, unspecified: Secondary | ICD-10-CM

## 2012-06-13 DIAGNOSIS — I428 Other cardiomyopathies: Secondary | ICD-10-CM | POA: Diagnosis not present

## 2012-06-13 DIAGNOSIS — I5022 Chronic systolic (congestive) heart failure: Secondary | ICD-10-CM | POA: Diagnosis not present

## 2012-06-13 NOTE — Progress Notes (Signed)
HPI   Patient is seen to follow cardiomyopathy. She is actually done very well. I saw her last July, 2012. She has seen Dr. Ladona Ridgel for follow up of her ICD in May, 2013. Her ICD is working well. She's not having chest pain. She does have some exertional shortness of breath but no PND or orthopnea. She has no edema. She has retired. She has gained weight. She is motivated to try to speak with A nutritionist soon.  No Known Allergies  Current Outpatient Prescriptions  Medication Sig Dispense Refill  . aspirin 81 MG tablet Take 81 mg by mouth daily. One by mouth every other day      . carvedilol (COREG) 25 MG tablet TAKE 1 TABLET BY MOUTH 2 TIMES DAILY WITH A MEAL.  60 tablet  6  . Chlorphen-Pseudoephed-APAP (TYLENOL ALLERGY SINUS PO) Take by mouth as needed.        . Cholecalciferol (VITAMIN D PO) Take by mouth daily.        . citalopram (CELEXA) 40 MG tablet TAKE 1 TABLET BY MOUTH EVERY DAY  30 tablet  6  . CRESTOR 10 MG tablet TAKE 1 TABLET EVERY DAY  30 tablet  3  . FLUOCINOLONE ACETONIDE SCALP 0.01 % external oil       . furosemide (LASIX) 40 MG tablet       . ibuprofen (ADVIL,MOTRIN) 200 MG tablet Take 200 mg by mouth every 6 (six) hours as needed.        Marland Kitchen levothyroxine (SYNTHROID, LEVOTHROID) 112 MCG tablet Take 112 mcg by mouth daily.      . metFORMIN (GLUMETZA) 500 MG (MOD) 24 hr tablet Take 500 mg by mouth 2 (two) times daily.        Marland Kitchen omeprazole (PRILOSEC) 20 MG capsule Take 20 mg by mouth daily.        . ramipril (ALTACE) 2.5 MG capsule TAKE 1 CAPSULE EVERY DAY  30 capsule  12  . spironolactone (ALDACTONE) 25 MG tablet TAKE 1 TABLET BY MOUTH EVERY DAY  30 tablet  10    History   Social History  . Marital Status: Single    Spouse Name: N/A    Number of Children: N/A  . Years of Education: N/A   Occupational History  . Not on file.   Social History Main Topics  . Smoking status: Never Smoker   . Smokeless tobacco: Not on file  . Alcohol Use: Yes     3 beers once a  week  . Drug Use: No  . Sexually Active: Not on file   Other Topics Concern  . Not on file   Social History Narrative  . No narrative on file    Family History  Problem Relation Age of Onset  . Heart attack Mother   . Lung cancer Father     Past Medical History  Diagnosis Date  . Nonischemic cardiomyopathy     Normal coronary arteries, catheterization, September, 2009  . Systolic CHF, chronic   . LBBB (left bundle branch block)     old  . ICD (implantable cardiac defibrillator) in place     10/2009; Dr. Ladona Ridgel  . Hypothyroidism   . GERD (gastroesophageal reflux disease)   . Dyslipidemia   . Borderline diabetes   . Mitral regurgitation     Mild, echo, September, 2010  . Overweight   . Ejection fraction < 50%     EF 25-30%, September, 2010    No past surgical  history on file.  ROS   Patient denies fever, chills, headache, sweats, rash, change in vision, change in hearing, chest pain, cough, nausea vomiting, urinary symptoms. All other systems are reviewed and are negative.  PHYSICAL EXAM  Patient is oriented to person time and place. Affect is normal. She is overweight and she has gained weight since the last visit. Lungs are clear. Respiratory effort is not labored. There is no jugulovenous distention. Cardiac exam reveals S1 and S2. There no clicks or significant murmurs. Abdomen is protuberant but soft. There is no peripheral edema.  Filed Vitals:   06/13/12 1153  BP: 90/62  Pulse: 72  Height: 5' (1.524 m)  Weight: 213 lb 12.8 oz (96.979 kg)     ASSESSMENT & PLAN

## 2012-06-13 NOTE — Assessment & Plan Note (Signed)
Patient is on appropriate medications at current doses. Her renal function was checked by her primary physician 3 months ago.

## 2012-06-13 NOTE — Patient Instructions (Addendum)
Your physician recommends that you continue on your current medications as directed. Please refer to the Current Medication list given to you today.  Your physician recommends that you schedule a follow-up appointment in: 1 year  

## 2012-06-13 NOTE — Assessment & Plan Note (Signed)
Her volume status is stable. No change in therapy. 

## 2012-06-13 NOTE — Assessment & Plan Note (Signed)
Patient says that she plans to speak with a nutritionist. We will give her a prescription just in case this helps.

## 2012-07-02 ENCOUNTER — Other Ambulatory Visit: Payer: Self-pay | Admitting: Internal Medicine

## 2012-07-11 DIAGNOSIS — Z23 Encounter for immunization: Secondary | ICD-10-CM | POA: Diagnosis not present

## 2012-07-13 ENCOUNTER — Other Ambulatory Visit: Payer: Medicare Other | Admitting: Internal Medicine

## 2012-07-13 ENCOUNTER — Other Ambulatory Visit: Payer: Self-pay | Admitting: Internal Medicine

## 2012-07-13 DIAGNOSIS — E039 Hypothyroidism, unspecified: Secondary | ICD-10-CM | POA: Diagnosis not present

## 2012-07-13 LAB — TSH: TSH: 2.573 u[IU]/mL (ref 0.350–4.500)

## 2012-07-25 ENCOUNTER — Encounter: Payer: Self-pay | Admitting: *Deleted

## 2012-07-28 ENCOUNTER — Other Ambulatory Visit: Payer: Self-pay | Admitting: Internal Medicine

## 2012-07-31 ENCOUNTER — Encounter: Payer: Self-pay | Admitting: Internal Medicine

## 2012-07-31 ENCOUNTER — Ambulatory Visit (INDEPENDENT_AMBULATORY_CARE_PROVIDER_SITE_OTHER): Payer: Medicare Other | Admitting: *Deleted

## 2012-07-31 DIAGNOSIS — I428 Other cardiomyopathies: Secondary | ICD-10-CM | POA: Diagnosis not present

## 2012-07-31 LAB — ICD DEVICE OBSERVATION
AL AMPLITUDE: 6.2 mv
AL IMPEDENCE ICD: 558 Ohm
AL THRESHOLD: 0.7 V
ATRIAL PACING ICD: 1 pct
DEV-0020ICD: NEGATIVE
RV LEAD THRESHOLD: 0.6 V
TZAT-0001SLOWVT: 1
TZAT-0001SLOWVT: 2
TZAT-0004FASTVT: 10
TZAT-0004SLOWVT: 10
TZAT-0004SLOWVT: 9
TZAT-0005FASTVT: 81 pct
TZAT-0005SLOWVT: 81 pct
TZAT-0012FASTVT: 220 ms
TZAT-0012FASTVT: 220 ms
TZAT-0013FASTVT: 4
TZAT-0013SLOWVT: 4
TZON-0005SLOWVT: 1
TZST-0001FASTVT: 3
TZST-0001FASTVT: 6
TZST-0001FASTVT: 8
TZST-0001SLOWVT: 4
TZST-0001SLOWVT: 6
TZST-0003FASTVT: 41 J
TZST-0003FASTVT: 41 J
TZST-0003SLOWVT: 41 J
TZST-0003SLOWVT: 41 J
TZST-0003SLOWVT: 41 J

## 2012-07-31 NOTE — Patient Instructions (Addendum)
Return office visit 11/01/12 @ 10:30am with the device clinic.

## 2012-07-31 NOTE — Progress Notes (Signed)
ICD check 

## 2012-08-11 DIAGNOSIS — Z8659 Personal history of other mental and behavioral disorders: Secondary | ICD-10-CM | POA: Insufficient documentation

## 2012-08-24 ENCOUNTER — Other Ambulatory Visit: Payer: Self-pay | Admitting: Cardiology

## 2012-08-24 MED ORDER — FUROSEMIDE 40 MG PO TABS: 40.0000 mg | ORAL_TABLET | Freq: Every day | ORAL | Status: DC

## 2012-10-07 ENCOUNTER — Other Ambulatory Visit: Payer: Self-pay | Admitting: Internal Medicine

## 2012-10-12 ENCOUNTER — Other Ambulatory Visit: Payer: Medicare Other | Admitting: Internal Medicine

## 2012-10-12 DIAGNOSIS — Z79899 Other long term (current) drug therapy: Secondary | ICD-10-CM

## 2012-10-12 DIAGNOSIS — E119 Type 2 diabetes mellitus without complications: Secondary | ICD-10-CM | POA: Diagnosis not present

## 2012-10-12 DIAGNOSIS — E039 Hypothyroidism, unspecified: Secondary | ICD-10-CM

## 2012-10-12 DIAGNOSIS — E785 Hyperlipidemia, unspecified: Secondary | ICD-10-CM | POA: Diagnosis not present

## 2012-10-12 LAB — LIPID PANEL
Cholesterol: 171 mg/dL (ref 0–200)
LDL Cholesterol: 98 mg/dL (ref 0–99)
Total CHOL/HDL Ratio: 2.9 Ratio
Triglycerides: 74 mg/dL (ref <150)
VLDL: 15 mg/dL (ref 0–40)

## 2012-10-12 LAB — HEPATIC FUNCTION PANEL
ALT: 11 U/L (ref 0–35)
AST: 13 U/L (ref 0–37)
Bilirubin, Direct: 0.1 mg/dL (ref 0.0–0.3)
Indirect Bilirubin: 0.5 mg/dL (ref 0.0–0.9)
Total Bilirubin: 0.6 mg/dL (ref 0.3–1.2)

## 2012-10-12 LAB — TSH: TSH: 5.808 u[IU]/mL — ABNORMAL HIGH (ref 0.350–4.500)

## 2012-10-13 ENCOUNTER — Encounter: Payer: Self-pay | Admitting: Internal Medicine

## 2012-10-13 ENCOUNTER — Ambulatory Visit (INDEPENDENT_AMBULATORY_CARE_PROVIDER_SITE_OTHER): Payer: Medicare Other | Admitting: Internal Medicine

## 2012-10-13 VITALS — BP 110/80 | HR 76 | Temp 99.1°F | Ht 59.25 in | Wt 213.0 lb

## 2012-10-13 DIAGNOSIS — E119 Type 2 diabetes mellitus without complications: Secondary | ICD-10-CM

## 2012-10-13 DIAGNOSIS — F329 Major depressive disorder, single episode, unspecified: Secondary | ICD-10-CM | POA: Diagnosis not present

## 2012-10-13 DIAGNOSIS — I428 Other cardiomyopathies: Secondary | ICD-10-CM | POA: Diagnosis not present

## 2012-10-13 DIAGNOSIS — E785 Hyperlipidemia, unspecified: Secondary | ICD-10-CM

## 2012-10-13 DIAGNOSIS — E039 Hypothyroidism, unspecified: Secondary | ICD-10-CM

## 2012-10-13 DIAGNOSIS — E669 Obesity, unspecified: Secondary | ICD-10-CM

## 2012-10-13 MED ORDER — LEVOTHYROXINE SODIUM 125 MCG PO TABS: 125.0000 ug | ORAL_TABLET | Freq: Every day | ORAL | Status: DC

## 2012-10-13 NOTE — Patient Instructions (Addendum)
Change Synthroid to Levothyroxine and return in 6 weeks for TSH

## 2012-10-13 NOTE — Progress Notes (Signed)
  Subjective:    Patient ID: [name], female    DOB: 1946/05/02, 67 y.o.   MRN: [id number]  HPI 67 year old White female with history of nonischemic cardiomyopathy, GE reflux, obesity, depression, dyslipidemia and hypothyroidism for six-month recheck. TSH is elevated on Synthroid 0.125 mg daily. 3 months ago TSH was within normal limits. Patient denies missing doses of medication. In any case, she wants to change to generic thyroid replacement i.e. levothyroxin. We are going to try levothyroxin 0.125 mg daily for 6 weeks and check TSH at that time without office visit. This may save her $20 a month or so. Immunizations are up-to-date. Colonoscopy and mammogram up-to-date. Sees cardiologist this coming summer. She did work some at  [employer] over the holidays. Enjoying retirement. Not exercising much. Continues to be overweight. History of asthma and bronchospasm with acute respiratory illnesses.    Review of Systems     Objective:   Physical Exam  Neck: Neck supple. No JVD present. No thyromegaly present.  Cardiovascular: Normal rate, regular rhythm, normal heart sounds and intact distal pulses.   Pulmonary/Chest: Effort normal and breath sounds normal. She has no wheezes.  Skin: Skin is warm and dry.          Assessment & Plan:  Nonischemic cardiomyopathy  Hypothyroidism-patient wants change to generic thyroid replacement. Prescribe levothyroxin 0.125 mg daily. Patient to return in 6 weeks for TSH only. TSH is elevated today on Synthroid but patient denies missing doses of medication. Says she's taking it on an empty stomach one hour before meals and with no other medication.  Dyslipidemia  History of asthma  History of GE reflux  History of depression  Type 2 diabetes mellitus-hemoglobin A1c 6.2% and stable  Plan: TSH only in 6 weeks on levothyroxin 0.125 mg daily. CPE due in 6 months. She was to come off Crestor but I explained to her she would need to  discuss that with cardiologist. Lipid panel is normal on Crestor.

## 2012-10-24 ENCOUNTER — Encounter: Payer: Self-pay | Admitting: Cardiology

## 2012-10-24 ENCOUNTER — Ambulatory Visit (INDEPENDENT_AMBULATORY_CARE_PROVIDER_SITE_OTHER): Payer: Medicare Other | Admitting: Cardiology

## 2012-10-24 ENCOUNTER — Encounter: Payer: Self-pay | Admitting: Internal Medicine

## 2012-10-24 DIAGNOSIS — Z95810 Presence of automatic (implantable) cardiac defibrillator: Secondary | ICD-10-CM | POA: Diagnosis not present

## 2012-10-24 DIAGNOSIS — I428 Other cardiomyopathies: Secondary | ICD-10-CM | POA: Diagnosis not present

## 2012-10-24 DIAGNOSIS — I509 Heart failure, unspecified: Secondary | ICD-10-CM | POA: Diagnosis not present

## 2012-10-24 DIAGNOSIS — I5022 Chronic systolic (congestive) heart failure: Secondary | ICD-10-CM

## 2012-10-24 LAB — ICD DEVICE OBSERVATION
AL IMPEDENCE ICD: 568 Ohm
AL THRESHOLD: 0.6 V
BAMS-0003: 70 {beats}/min
DEVICE MODEL ICD: 160860
FVT: 0
HV IMPEDENCE: 62 Ohm
RV LEAD THRESHOLD: 0.6 V
TZAT-0004SLOWVT: 10
TZAT-0004SLOWVT: 9
TZAT-0005FASTVT: 81 pct
TZAT-0005SLOWVT: 81 pct
TZAT-0012FASTVT: 220 ms
TZAT-0012FASTVT: 220 ms
TZAT-0012SLOWVT: 200 ms
TZAT-0012SLOWVT: 220 ms
TZAT-0013FASTVT: 2
TZAT-0013FASTVT: 4
TZAT-0013SLOWVT: 4
TZAT-0013SLOWVT: 4
TZAT-0018FASTVT: NEGATIVE
TZAT-0018FASTVT: NEGATIVE
TZAT-0018SLOWVT: NEGATIVE
TZAT-0019FASTVT: 5 V
TZAT-0019FASTVT: 5 V
TZAT-0020FASTVT: 1 ms
TZON-0003FASTVT: 300 ms
TZON-0004FASTVT: 12
TZON-0004SLOWVT: 60
TZST-0001FASTVT: 5
TZST-0001FASTVT: 8
TZST-0001SLOWVT: 4
TZST-0001SLOWVT: 6
TZST-0003FASTVT: 41 J
TZST-0003FASTVT: 41 J
TZST-0003SLOWVT: 41 J
TZST-0003SLOWVT: 41 J
VF: 0

## 2012-10-24 NOTE — Progress Notes (Signed)
[facility] ICD check/device clinic only. See PaceArt report. 

## 2012-10-24 NOTE — Patient Instructions (Addendum)
Your physician recommends that you schedule a follow-up appointment in: 3 months with Dr Taylor  

## 2012-11-23 ENCOUNTER — Other Ambulatory Visit: Payer: Medicare Other | Admitting: Internal Medicine

## 2012-11-23 LAB — TSH: TSH: 0.606 u[IU]/mL (ref 0.350–4.500)

## 2012-12-06 ENCOUNTER — Other Ambulatory Visit: Payer: Self-pay

## 2012-12-06 MED ORDER — CARVEDILOL 25 MG PO TABS: 25.0000 mg | ORAL_TABLET | Freq: Two times a day (BID) | ORAL | Status: DC

## 2012-12-15 ENCOUNTER — Other Ambulatory Visit: Payer: Self-pay

## 2012-12-15 MED ORDER — SPIRONOLACTONE 25 MG PO TABS: 25.0000 mg | ORAL_TABLET | Freq: Every day | ORAL | Status: DC

## 2012-12-20 ENCOUNTER — Other Ambulatory Visit: Payer: Self-pay | Admitting: *Deleted

## 2012-12-20 MED ORDER — FUROSEMIDE 40 MG PO TABS: 40.0000 mg | ORAL_TABLET | Freq: Every day | ORAL | Status: DC

## 2013-01-23 ENCOUNTER — Encounter: Payer: Self-pay | Admitting: Internal Medicine

## 2013-01-23 ENCOUNTER — Ambulatory Visit (INDEPENDENT_AMBULATORY_CARE_PROVIDER_SITE_OTHER): Payer: Medicare Other | Admitting: Internal Medicine

## 2013-01-23 VITALS — BP 110/70 | HR 52 | Ht 60.0 in | Wt 217.4 lb

## 2013-01-23 DIAGNOSIS — I428 Other cardiomyopathies: Secondary | ICD-10-CM

## 2013-01-23 DIAGNOSIS — Z95810 Presence of automatic (implantable) cardiac defibrillator: Secondary | ICD-10-CM

## 2013-01-23 DIAGNOSIS — I509 Heart failure, unspecified: Secondary | ICD-10-CM

## 2013-01-23 DIAGNOSIS — I5022 Chronic systolic (congestive) heart failure: Secondary | ICD-10-CM | POA: Diagnosis not present

## 2013-01-23 LAB — ICD DEVICE OBSERVATION
AL AMPLITUDE: 7.3 mv
ATRIAL PACING ICD: 0 pct
DEVICE MODEL ICD: 160860
HV IMPEDENCE: 59 Ohm
RV LEAD AMPLITUDE: 16.8 mv
TZAT-0001FASTVT: 1
TZAT-0001FASTVT: 2
TZAT-0001SLOWVT: 1
TZAT-0004FASTVT: 10
TZAT-0005FASTVT: 81 pct
TZAT-0013SLOWVT: 4
TZAT-0018FASTVT: NEGATIVE
TZAT-0018SLOWVT: NEGATIVE
TZAT-0019FASTVT: 5 V
TZAT-0019SLOWVT: 5 V
TZAT-0019SLOWVT: 5 V
TZAT-0020FASTVT: 1 ms
TZAT-0020SLOWVT: 1 ms
TZAT-0020SLOWVT: 1 ms
TZON-0003SLOWVT: 353 ms
TZON-0004SLOWVT: 60
TZON-0005FASTVT: 1
TZST-0001FASTVT: 4
TZST-0001FASTVT: 5
TZST-0001FASTVT: 7
TZST-0001SLOWVT: 3
TZST-0001SLOWVT: 5
TZST-0001SLOWVT: 7
TZST-0003FASTVT: 41 J
TZST-0003FASTVT: 41 J
TZST-0003FASTVT: 41 J
TZST-0003SLOWVT: 41 J
TZST-0003SLOWVT: 41 J
VENTRICULAR PACING ICD: 0 pct

## 2013-01-23 NOTE — Assessment & Plan Note (Signed)
Her [employer] dual-chamber ICD is working normally. We'll plan to recheck in several months. 

## 2013-01-23 NOTE — Progress Notes (Signed)
HPI [name] returns today for followup. She is a very pleasant 67 year old woman with a nonischemic cardiomyopathy, chronic class II systolic heart failure, obesity, status post ICD implantation. In the interim, she has done well. She denies chest pain, shortness of breath, or syncope. No recent ICD shocks. No peripheral edema.  No Known Allergies   Current Outpatient Prescriptions  Medication Sig Dispense Refill  . aspirin 81 MG tablet Take 81 mg by mouth daily. One by mouth every other day      . carvedilol (COREG) 25 MG tablet Take 1 tablet (25 mg total) by mouth 2 (two) times daily with a meal.  60 tablet  3  . Chlorphen-Pseudoephed-APAP (TYLENOL ALLERGY SINUS PO) Take by mouth as needed.        . Cholecalciferol (VITAMIN D PO) Take by mouth daily.        . citalopram (CELEXA) 40 MG tablet TAKE 1 TABLET BY MOUTH EVERY DAY  30 tablet  6  . CRESTOR 10 MG tablet TAKE 1 TABLET EVERY DAY  30 tablet  3  . FLUOCINOLONE ACETONIDE SCALP 0.01 % external oil       . furosemide (LASIX) 40 MG tablet Take 1 tablet (40 mg total) by mouth daily.  30 tablet  5  . ibuprofen (ADVIL,MOTRIN) 200 MG tablet Take 200 mg by mouth every 6 (six) hours as needed.        Marland Kitchen levothyroxine (SYNTHROID, LEVOTHROID) 125 MCG tablet Take 1 tablet (125 mcg total) by mouth daily.  90 tablet  3  . metFORMIN (GLUMETZA) 500 MG (MOD) 24 hr tablet Take 500 mg by mouth 2 (two) times daily.        Marland Kitchen omeprazole (PRILOSEC) 20 MG capsule Take 20 mg by mouth daily.        . ramipril (ALTACE) 2.5 MG capsule TAKE 1 CAPSULE EVERY DAY  30 capsule  11  . spironolactone (ALDACTONE) 25 MG tablet Take 1 tablet (25 mg total) by mouth daily.  30 tablet  5   No current facility-administered medications for this visit.     Past Medical History  Diagnosis Date  . Nonischemic cardiomyopathy     Normal coronary arteries, catheterization, September, 2009  . Systolic CHF, chronic   . LBBB (left bundle branch block)     old  . ICD  (implantable cardiac defibrillator) in place     10/2009; Dr. Ladona Ridgel  . Hypothyroidism   . GERD (gastroesophageal reflux disease)   . Dyslipidemia   . Borderline diabetes   . Mitral regurgitation     Mild, echo, September, 2010  . Overweight   . Ejection fraction < 50%     EF 25-30%, September, 2010    ROS:   All systems reviewed and negative except as noted in the HPI.   History reviewed. No pertinent past surgical history.   Family History  Problem Relation Age of Onset  . Heart attack Mother   . Lung cancer Father      History   Social History  . Marital Status: Single    Spouse Name: N/A    Number of Children: N/A  . Years of Education: N/A   Occupational History  . Not on file.   Social History Main Topics  . Smoking status: Never Smoker   . Smokeless tobacco: Not on file  . Alcohol Use: Yes     Comment: 3 beers once a week  . Drug Use: No  . Sexually Active: Not on  file   Other Topics Concern  . Not on file   Social History Narrative  . No narrative on file     BP 110/70  Pulse 52  Ht 5' (1.524 m)  Wt 217 lb 6.4 oz (98.612 kg)  BMI 42.46 kg/m2  Physical Exam:  Well appearing middle-age woman, NAD HEENT: Unremarkable Neck: no jugulovenous distention, no thyromegaly Back:  No CVA tenderness Lungs:  Clear with no wheezes, rales, or rhonchi. HEART:  Regular rate rhythm, no murmurs, no rubs, no clicks Abd:  soft, positive bowel sounds, no organomegally, no rebound, no guarding Ext:  2 plus pulses, no edema, no cyanosis, no clubbing Skin:  No rashes no nodules Neuro:  CN II through XII intact, motor grossly intact  DEVICE  Normal device function.  See PaceArt for details.   Assess/Plan:

## 2013-01-23 NOTE — Assessment & Plan Note (Signed)
Her chronic systolic heart failure is well compensated. She will continue her current medical therapy, and maintain a low-sodium diet.

## 2013-01-23 NOTE — Patient Instructions (Addendum)
Your physician recommends that you schedule a follow-up appointment in: 3 months with the device clinic and 12 months with Dr Taylor  

## 2013-02-08 ENCOUNTER — Other Ambulatory Visit: Payer: Self-pay | Admitting: Internal Medicine

## 2013-03-12 ENCOUNTER — Other Ambulatory Visit: Payer: Self-pay | Admitting: Internal Medicine

## 2013-04-09 ENCOUNTER — Other Ambulatory Visit: Payer: Self-pay | Admitting: Cardiology

## 2013-04-12 ENCOUNTER — Other Ambulatory Visit: Payer: Self-pay | Admitting: Internal Medicine

## 2013-04-16 ENCOUNTER — Other Ambulatory Visit: Payer: Medicare Other | Admitting: Internal Medicine

## 2013-04-16 DIAGNOSIS — E119 Type 2 diabetes mellitus without complications: Secondary | ICD-10-CM | POA: Diagnosis not present

## 2013-04-16 DIAGNOSIS — E785 Hyperlipidemia, unspecified: Secondary | ICD-10-CM | POA: Diagnosis not present

## 2013-04-16 DIAGNOSIS — Z130 Encounter for screening for diseases of the blood and blood-forming organs and certain disorders involving the immune mechanism: Secondary | ICD-10-CM | POA: Diagnosis not present

## 2013-04-16 DIAGNOSIS — Z13228 Encounter for screening for other metabolic disorders: Secondary | ICD-10-CM

## 2013-04-16 DIAGNOSIS — E039 Hypothyroidism, unspecified: Secondary | ICD-10-CM | POA: Diagnosis not present

## 2013-04-16 DIAGNOSIS — Z0000 Encounter for general adult medical examination without abnormal findings: Secondary | ICD-10-CM

## 2013-04-16 DIAGNOSIS — Z1329 Encounter for screening for other suspected endocrine disorder: Secondary | ICD-10-CM | POA: Diagnosis not present

## 2013-04-16 LAB — COMPREHENSIVE METABOLIC PANEL
ALT: 17 U/L (ref 0–35)
AST: 15 U/L (ref 0–37)
BUN: 20 mg/dL (ref 6–23)
Calcium: 9.4 mg/dL (ref 8.4–10.5)
Creat: 0.71 mg/dL (ref 0.50–1.10)
Total Bilirubin: 0.6 mg/dL (ref 0.3–1.2)

## 2013-04-16 LAB — CBC WITH DIFFERENTIAL/PLATELET
Basophils Absolute: 0.1 10*3/uL (ref 0.0–0.1)
Basophils Relative: 1 % (ref 0–1)
Eosinophils Absolute: 0.2 10*3/uL (ref 0.0–0.7)
Eosinophils Relative: 4 % (ref 0–5)
HCT: 36.7 % (ref 36.0–46.0)
MCH: 31.7 pg (ref 26.0–34.0)
MCHC: 34.3 g/dL (ref 30.0–36.0)
MCV: 92.4 fL (ref 78.0–100.0)
Monocytes Absolute: 0.4 10*3/uL (ref 0.1–1.0)
Platelets: 285 10*3/uL (ref 150–400)
RDW: 14.4 % (ref 11.5–15.5)
WBC: 4.2 10*3/uL (ref 4.0–10.5)

## 2013-04-16 LAB — LIPID PANEL
HDL: 54 mg/dL (ref >39)
Total CHOL/HDL Ratio: 2.8 Ratio
VLDL: 17 mg/dL (ref 0–40)

## 2013-04-16 LAB — HEMOGLOBIN A1C
Hgb A1c MFr Bld: 5.9 % — ABNORMAL HIGH (ref <5.7)
Mean Plasma Glucose: 123 mg/dL — ABNORMAL HIGH (ref <117)

## 2013-04-17 ENCOUNTER — Encounter: Payer: Self-pay | Admitting: Internal Medicine

## 2013-04-17 ENCOUNTER — Ambulatory Visit (INDEPENDENT_AMBULATORY_CARE_PROVIDER_SITE_OTHER): Payer: Medicare Other | Admitting: Internal Medicine

## 2013-04-17 VITALS — BP 92/74 | HR 72 | Temp 97.9°F | Ht 59.5 in | Wt 214.0 lb

## 2013-04-17 DIAGNOSIS — E039 Hypothyroidism, unspecified: Secondary | ICD-10-CM

## 2013-04-17 DIAGNOSIS — Z1239 Encounter for other screening for malignant neoplasm of breast: Secondary | ICD-10-CM

## 2013-04-17 DIAGNOSIS — E119 Type 2 diabetes mellitus without complications: Secondary | ICD-10-CM | POA: Diagnosis not present

## 2013-04-17 DIAGNOSIS — I428 Other cardiomyopathies: Secondary | ICD-10-CM | POA: Diagnosis not present

## 2013-04-17 DIAGNOSIS — K219 Gastro-esophageal reflux disease without esophagitis: Secondary | ICD-10-CM

## 2013-04-17 DIAGNOSIS — E669 Obesity, unspecified: Secondary | ICD-10-CM

## 2013-04-17 DIAGNOSIS — E785 Hyperlipidemia, unspecified: Secondary | ICD-10-CM | POA: Diagnosis not present

## 2013-04-17 DIAGNOSIS — J309 Allergic rhinitis, unspecified: Secondary | ICD-10-CM

## 2013-04-17 DIAGNOSIS — Z8659 Personal history of other mental and behavioral disorders: Secondary | ICD-10-CM

## 2013-04-17 DIAGNOSIS — I10 Essential (primary) hypertension: Secondary | ICD-10-CM

## 2013-04-17 DIAGNOSIS — Z0000 Encounter for general adult medical examination without abnormal findings: Secondary | ICD-10-CM | POA: Diagnosis not present

## 2013-04-17 DIAGNOSIS — Z1211 Encounter for screening for malignant neoplasm of colon: Secondary | ICD-10-CM

## 2013-04-17 DIAGNOSIS — Z95810 Presence of automatic (implantable) cardiac defibrillator: Secondary | ICD-10-CM

## 2013-04-17 LAB — POCT URINALYSIS DIPSTICK
Glucose, UA: NEGATIVE
Leukocytes, UA: NEGATIVE
Nitrite, UA: NEGATIVE
Protein, UA: NEGATIVE
Spec Grav, UA: 1.01
Urobilinogen, UA: NEGATIVE

## 2013-04-17 LAB — VITAMIN D 25 HYDROXY (VIT D DEFICIENCY, FRACTURES): Vit D, 25-Hydroxy: 52 ng/mL (ref 30–89)

## 2013-04-17 NOTE — Progress Notes (Addendum)
Subjective:    Patient ID: [name], female    DOB: 1946-08-24, 67 y.o.   MRN: [id number]  HPI  67 year old White female with history of DM, Hyperlipidemia, GE reflux, hypothyroidism, nonischemic cardiomypoathy, cardiac defibrillator, mitral regurgitation, left bundle branch block, obesity, HTN, hx of depression for health maintenance and evaluation of medical problems. Patient is followed by [facility]. She doesn't exercise very much because of cardiomyopathy. Does walk her dog some.  Does not smoke. Occasionally will drink beer. She is retired and formerly worked at  [employer]. Prior to that job, she worked at [employer]. Patient is single completed 4 years of college.  Dislocated right shoulder December 2001. Multiple eye surgeries as a child. Tonsillectomy at age 45. History of allergic rhinitis and occasionally gets acute bronchospasm.  No known drug allergies.  Has a living will.  Ophthalmologist is Dr. Elmer Picker.  Had Pneumovax immunization in 2008, tetanus immunization 2011. Had mammogram February 2012.  Family history: Father died of lung cancer in his 33s. Mother died at age 54 of an MI with history of possible diabetes. No siblings. No children.    Review of Systems  Constitutional: Positive for fatigue.  HENT: Negative.   Eyes: Negative.   Respiratory: Positive for shortness of breath.   Cardiovascular: Negative.   Gastrointestinal: Negative.   Endocrine:       Hypothyroidism  Genitourinary: Negative.   Allergic/Immunologic: Positive for environmental allergies.  Neurological: Negative.   Hematological: Negative.   Psychiatric/Behavioral:       History of depression       Objective:   Physical Exam  Vitals reviewed. Constitutional: She is oriented to person, place, and time. She appears well-developed and well-nourished. No distress.  HENT:  Head: Normocephalic and atraumatic.  Right Ear: External ear normal.  Left  Ear: External ear normal.  Mouth/Throat: Oropharynx is clear and moist.  Eyes: Conjunctivae are normal. Right eye exhibits no discharge. Left eye exhibits no discharge.  Neck: Neck supple. No JVD present. No thyromegaly present.  Cardiovascular: Normal rate, regular rhythm, normal heart sounds and intact distal pulses.   No murmur heard. Pulmonary/Chest: Effort normal and breath sounds normal. No respiratory distress. She has no wheezes. She has no rales. She exhibits no tenderness.  Abdominal: Soft. Bowel sounds are normal. She exhibits no distension and no mass. There is no tenderness. There is no rebound and no guarding.  Lymphadenopathy:    She has no cervical adenopathy.  Neurological: She is alert and oriented to person, place, and time. She has normal reflexes. She displays normal reflexes. No cranial nerve deficit. Coordination normal.  Skin: Skin is warm and dry. No rash noted. She is not diaphoretic.  Psychiatric: She has a normal mood and affect. Her behavior is normal. Judgment and thought content normal.          Assessment & Plan:  HTN- stable  Hyperlipdemia- normal lipid panel  History of depression  History of GE reflux  History of allergic rhinitis  History of internal cardiac defibrillator  History of nonischemic cardiomyopathy cardiomyopathy  Hypothyroidism  Obesity  History of type 2 diabetes mellitus controlled  Plan: Encouraged diet and exercise. Encourage her to walk when she can. Return in 6 months. No change in medications.    Subjective:   Patient presents for Medicare Annual/Subsequent preventive examination.   Review Past Medical/Family/Social:  See EPIC   Risk Factors  Current exercise habits: walk twice a week- needs to increase  Dietary issues discussed:   Cardiac risk factors: AODM, Hyperlipidemia, HTN, cardiomypothy  Depression Screen  (Note: if answer to either of the following is "Yes", a more complete depression screening is  indicated)   Over the past two weeks, have you felt down, depressed or hopeless? No  Over the past two weeks, have you felt little interest or pleasure in doing things? No Have you lost interest or pleasure in daily life? No Do you often feel hopeless? No Do you cry easily over simple problems? No   Activities of Daily Living  In your present state of health, do you have any difficulty performing the following activities?:   Driving? No  Managing money? No  Feeding yourself? No  Getting from bed to chair? No  Climbing a flight of stairs?  Some SOB Preparing food and eating?: No  Bathing or showering? No  Getting dressed: No  Getting to the toilet? No  Using the toilet:No  Moving around from place to place: No  In the past year have you fallen or had a near fall?:No  Are you sexually active? No  Do you have more than one partner? No   Hearing Difficulties: No  Do you often ask people to speak up or repeat themselves?  occasionally Do you experience ringing or noises in your ears? No  Do you have difficulty understanding soft or whispered voices? No  Do you feel that you have a problem with memory? No Do you often misplace items? No    Home Safety:  Do you have a smoke alarm at your residence? No Do you have grab bars in the bathroom? no Do you have throw rugs in your house? no   Cognitive Testing  Alert? Yes Normal Appearance?Yes  Oriented to person? Yes Place? Yes  Time? Yes  Recall of three objects? Yes  Can perform simple calculations? Yes  Displays appropriate judgment?Yes  Can read the correct time from a watch face?Yes   List the Names of Other Physician/Practitioners you currently use:  See referral list for the physicians patient is currently seeing. Cardiology    Review of Systems: see above   Objective:     General appearance: Appears stated age and obese  Head: Normocephalic, without obvious abnormality, atraumatic  Eyes: conj clear, EOMi PEERLA   Ears: normal TM's and external ear canals both ears  Nose: Nares normal. Septum midline. Mucosa normal. No drainage or sinus tenderness.  Throat: lips, mucosa, and tongue normal; teeth and gums normal  Neck: no adenopathy, no carotid bruit, no JVD, supple, symmetrical, trachea midline and thyroid not enlarged, symmetric, no tenderness/mass/nodules  No CVA tenderness.  Lungs: clear to auscultation bilaterally  Breasts: normal appearance, no masses or tenderness. Heart: regular rate and rhythm, S1, S2 normal, no murmur, click, rub or gallop  Abdomen: soft, non-tender; bowel sounds normal; no masses, no organomegaly  Musculoskeletal: ROM normal in all joints, no crepitus, no deformity, Normal muscle strengthen. Back  is symmetric, no curvature. Skin: Skin color, texture, turgor normal. No rashes or lesions  Lymph nodes: Cervical, supraclavicular, and axillary nodes normal.  Neurologic: CN 2 -12 Normal, Normal symmetric reflexes. Normal coordination and gait  Psych: Alert & Oriented x 3, Mood appear stable.    Assessment:    Annual wellness medicare exam   Plan:    During the course of the visit the patient was educated and counseled about appropriate screening and preventive services including:   Annual mammogram reminded Colonoscopy  Patient Instructions (the written plan) was given to the patient.  Medicare Attestation  I have personally reviewed:  The patient's medical and social history  Their use of alcohol, tobacco or illicit drugs  Their current medications and supplements  The patient's functional ability including ADLs,fall risks, home safety risks, cognitive, and hearing and visual impairment  Diet and physical activities  Evidence for depression or mood disorders  The patient's weight, height, BMI, and visual acuity have been recorded in the chart. I have made referrals, counseling, and provided education to the patient based on review of the above and I have  provided the patient with a written personalized care plan for preventive services.

## 2013-04-19 ENCOUNTER — Telehealth: Payer: Self-pay

## 2013-04-19 ENCOUNTER — Encounter: Payer: Self-pay | Admitting: Internal Medicine

## 2013-04-19 DIAGNOSIS — Z1211 Encounter for screening for malignant neoplasm of colon: Secondary | ICD-10-CM

## 2013-04-19 NOTE — Telephone Encounter (Signed)
aaaaaa

## 2013-04-25 ENCOUNTER — Ambulatory Visit (INDEPENDENT_AMBULATORY_CARE_PROVIDER_SITE_OTHER): Payer: Medicare Other | Admitting: *Deleted

## 2013-04-25 ENCOUNTER — Encounter: Payer: Self-pay | Admitting: Internal Medicine

## 2013-04-25 DIAGNOSIS — I428 Other cardiomyopathies: Secondary | ICD-10-CM

## 2013-04-25 LAB — ICD DEVICE OBSERVATION
AL AMPLITUDE: 4.3 mv
BAMS-0003: 70 {beats}/min
DEVICE MODEL ICD: 160860
HV IMPEDENCE: 60 Ohm
RV LEAD AMPLITUDE: 17 mv
RV LEAD IMPEDENCE ICD: 581 Ohm
TZAT-0001FASTVT: 1
TZAT-0001FASTVT: 2
TZAT-0001SLOWVT: 1
TZAT-0005SLOWVT: 81 pct
TZAT-0012SLOWVT: 220 ms
TZAT-0013FASTVT: 2
TZAT-0018FASTVT: NEGATIVE
TZAT-0018FASTVT: NEGATIVE
TZAT-0018SLOWVT: NEGATIVE
TZAT-0019FASTVT: 5 V
TZAT-0019SLOWVT: 5 V
TZAT-0019SLOWVT: 5 V
TZAT-0020FASTVT: 1 ms
TZAT-0020SLOWVT: 1 ms
TZAT-0020SLOWVT: 1 ms
TZON-0003SLOWVT: 353 ms
TZON-0004FASTVT: 10
TZON-0004SLOWVT: 60
TZON-0005FASTVT: 1
TZON-0005SLOWVT: 1
TZST-0001FASTVT: 3
TZST-0001FASTVT: 5
TZST-0001FASTVT: 7
TZST-0001SLOWVT: 5
TZST-0001SLOWVT: 7
TZST-0003FASTVT: 41 J
TZST-0003FASTVT: 41 J
TZST-0003FASTVT: 41 J
TZST-0003FASTVT: 41 J
TZST-0003SLOWVT: 41 J
TZST-0003SLOWVT: 41 J
TZST-0003SLOWVT: 41 J
VENTRICULAR PACING ICD: 1 pct

## 2013-04-25 NOTE — Progress Notes (Signed)
ICD check in office. 

## 2013-05-02 ENCOUNTER — Ambulatory Visit
Admission: RE | Admit: 2013-05-02 | Discharge: 2013-05-02 | Disposition: A | Discharge status: Home / Self Care | Payer: Medicare Other | Source: Ambulatory Visit | Attending: Internal Medicine | Admitting: Internal Medicine

## 2013-05-02 ENCOUNTER — Other Ambulatory Visit: Payer: Self-pay | Admitting: Internal Medicine

## 2013-05-02 ENCOUNTER — Ambulatory Visit: Admission: RE | Admit: 2013-05-02 | Payer: Medicare Other | Source: Ambulatory Visit

## 2013-05-02 DIAGNOSIS — Z1231 Encounter for screening mammogram for malignant neoplasm of breast: Secondary | ICD-10-CM | POA: Diagnosis not present

## 2013-05-13 NOTE — Patient Instructions (Addendum)
Continue same meds and return in 6 months. Try to walk for exercise.

## 2013-05-13 NOTE — Addendum Note (Signed)
Addended by: Margaree Mackintosh on: 05/13/2013 09:11 PM   Modules accepted: Level of Service

## 2013-06-10 DIAGNOSIS — Z23 Encounter for immunization: Secondary | ICD-10-CM | POA: Diagnosis not present

## 2013-06-13 ENCOUNTER — Ambulatory Visit (INDEPENDENT_AMBULATORY_CARE_PROVIDER_SITE_OTHER): Payer: Medicare Other | Admitting: Cardiology

## 2013-06-13 ENCOUNTER — Encounter: Payer: Self-pay | Admitting: Cardiology

## 2013-06-13 VITALS — BP 106/70 | HR 77 | Ht 59.5 in | Wt 215.0 lb

## 2013-06-13 DIAGNOSIS — I5022 Chronic systolic (congestive) heart failure: Secondary | ICD-10-CM

## 2013-06-13 DIAGNOSIS — I509 Heart failure, unspecified: Secondary | ICD-10-CM | POA: Diagnosis not present

## 2013-06-13 DIAGNOSIS — I428 Other cardiomyopathies: Secondary | ICD-10-CM | POA: Diagnosis not present

## 2013-06-13 NOTE — Assessment & Plan Note (Signed)
The patient is on appropriate medications for her cardiomyopathy. No change in therapy.

## 2013-06-13 NOTE — Assessment & Plan Note (Signed)
Her volume status is stable. No change in meds.

## 2013-06-13 NOTE — Assessment & Plan Note (Signed)
Her ICD is stable in place and is being followed. No change in therapy.

## 2013-06-13 NOTE — Progress Notes (Signed)
HPI  Patient is seen for followup her nonischemic cardiomyopathy. She's actually doing well. It has been a year since her last visit with me. She has had her ICD checked by our electrophysiology team. She also has seen her primary physician. I have reviewed all of these nodes. She's not having any significant chest pain or shortness of breath. She has gained a few pounds.  No Known Allergies  Current Outpatient Prescriptions  Medication Sig Dispense Refill  . aspirin 81 MG tablet Take 81 mg by mouth daily.       . carvedilol (COREG) 25 MG tablet TAKE 1 TABLET (25 MG TOTAL) BY MOUTH 2 (TWO) TIMES DAILY WITH A MEAL.  60 tablet  3  . Chlorphen-Pseudoephed-APAP (TYLENOL ALLERGY SINUS PO) Take by mouth as needed.        . Cholecalciferol (VITAMIN D PO) Take by mouth daily.        . citalopram (CELEXA) 40 MG tablet TAKE 1 TABLET BY MOUTH EVERY DAY  30 tablet  6  . clobetasol (TEMOVATE) 0.05 % external solution       . CRESTOR 10 MG tablet TAKE 1 TABLET EVERY DAY  30 tablet  3  . FLUOCINOLONE ACETONIDE SCALP 0.01 % external oil       . furosemide (LASIX) 40 MG tablet Take 1 tablet (40 mg total) by mouth daily.  30 tablet  5  . ibuprofen (ADVIL,MOTRIN) 200 MG tablet Take 200 mg by mouth every 6 (six) hours as needed.        Marland Kitchen levothyroxine (SYNTHROID, LEVOTHROID) 112 MCG tablet Take 112 mcg by mouth daily before breakfast.      . metFORMIN (GLUCOPHAGE) 500 MG tablet TAKE 1 TABLET BY MOUTH TWICE A DAY  180 tablet  2  . omeprazole (PRILOSEC) 20 MG capsule TAKE 1 TABLET BY MOUTH EVERY DAY  90 capsule  3  . ramipril (ALTACE) 2.5 MG capsule TAKE 1 CAPSULE EVERY DAY  30 capsule  11  . spironolactone (ALDACTONE) 25 MG tablet Take 1 tablet (25 mg total) by mouth daily.  30 tablet  5   No current facility-administered medications for this visit.    History   Social History  . Marital Status: Single    Spouse Name: N/A    Number of Children: N/A  . Years of Education: N/A   Occupational History    . Not on file.   Social History Main Topics  . Smoking status: Never Smoker   . Smokeless tobacco: Not on file  . Alcohol Use: Yes     Comment: 3 beers once a week  . Drug Use: No  . Sexual Activity: Not on file   Other Topics Concern  . Not on file   Social History Narrative  . No narrative on file    Family History  Problem Relation Age of Onset  . Heart attack Mother   . Lung cancer Father     Past Medical History  Diagnosis Date  . Nonischemic cardiomyopathy     Normal coronary arteries, catheterization, September, 2009  . Systolic CHF, chronic   . LBBB (left bundle branch block)     old  . ICD (implantable cardiac defibrillator) in place     10/2009; Dr. Ladona Ridgel  . Hypothyroidism   . GERD (gastroesophageal reflux disease)   . Dyslipidemia   . Borderline diabetes   . Mitral regurgitation     Mild, echo, September, 2010  . Overweight(278.02)   . Ejection  fraction < 50%     EF 25-30%, September, 2010    History reviewed. No pertinent past surgical history.  Patient Active Problem List   Diagnosis Date Noted  . History of depression 08/11/2012  . Nonischemic cardiomyopathy   . Systolic CHF, chronic   . LBBB (left bundle branch block)   . ICD-[employer]   . Hypothyroidism   . GERD (gastroesophageal reflux disease)   . Dyslipidemia   . Type 2 diabetes mellitus   . Mitral regurgitation   . Overweight   . Ejection fraction < 50%     ROS   Patient denies fever, chills, headache, sweats, rash, change in vision, change in hearing, chest pain, cough, nausea vomiting, urinary symptoms. All other systems are reviewed and are negative.  PHYSICAL EXAM  Patient is overweight. She is oriented to person time and place. Affect is normal. There is no jugulovenous distention. Lungs are clear. Respiratory effort is nonlabored. Cardiac exam reveals S1 and S2. There are no clicks or significant murmurs. The abdomen is soft. There is no peripheral edema.  Filed  Vitals:   06/13/13 1121  BP: 106/70  Pulse: 77  Height: 4' 11.5" (1.511 m)  Weight: 215 lb (97.523 kg)   EKG is done today and reviewed by me. There is normal sinus rhythm. There is a left bundle branch block.  ASSESSMENT & PLAN

## 2013-06-13 NOTE — Patient Instructions (Addendum)
**Note De-identified [place] Obfuscation** Your physician recommends that you continue on your current medications as directed. Please refer to the Current Medication list given to you today.  Your physician wants you to follow-up in: 1 year. You will receive a reminder letter in the mail two months in advance. If you don't receive a letter, please call our office to schedule the follow-up appointment.  

## 2013-06-14 ENCOUNTER — Telehealth: Payer: Self-pay

## 2013-06-14 DIAGNOSIS — Z1239 Encounter for other screening for malignant neoplasm of breast: Secondary | ICD-10-CM

## 2013-06-15 ENCOUNTER — Other Ambulatory Visit: Payer: Self-pay

## 2013-06-15 MED ORDER — SPIRONOLACTONE 25 MG PO TABS: 25.0000 mg | ORAL_TABLET | Freq: Every day | ORAL | Status: DC

## 2013-06-19 ENCOUNTER — Other Ambulatory Visit: Payer: Self-pay | Admitting: Cardiology

## 2013-06-20 ENCOUNTER — Ambulatory Visit (AMBULATORY_SURGERY_CENTER): Payer: Self-pay

## 2013-06-20 ENCOUNTER — Telehealth: Payer: Self-pay

## 2013-06-20 VITALS — Ht 60.0 in | Wt 212.0 lb

## 2013-06-20 DIAGNOSIS — Z1211 Encounter for screening for malignant neoplasm of colon: Secondary | ICD-10-CM

## 2013-06-20 MED ORDER — MOVIPREP 100 G PO SOLR: 1.0000 | Freq: Once | ORAL | Status: DC

## 2013-06-20 NOTE — Telephone Encounter (Signed)
Yes, office visit please Thanks JMP

## 2013-06-20 NOTE — Telephone Encounter (Signed)
EF 25-30% and also has ICD since 2011

## 2013-06-20 NOTE — Telephone Encounter (Signed)
Yes office visit please

## 2013-06-25 ENCOUNTER — Other Ambulatory Visit: Payer: Self-pay | Admitting: Internal Medicine

## 2013-07-04 ENCOUNTER — Encounter: Payer: Medicare Other | Admitting: Internal Medicine

## 2013-07-20 ENCOUNTER — Other Ambulatory Visit: Payer: Self-pay | Admitting: Gastroenterology

## 2013-07-23 ENCOUNTER — Encounter: Payer: Self-pay | Admitting: Internal Medicine

## 2013-07-23 ENCOUNTER — Ambulatory Visit (INDEPENDENT_AMBULATORY_CARE_PROVIDER_SITE_OTHER): Payer: Medicare Other | Admitting: Internal Medicine

## 2013-07-23 VITALS — BP 110/60 | HR 60 | Ht 59.0 in | Wt 211.8 lb

## 2013-07-23 DIAGNOSIS — Z1211 Encounter for screening for malignant neoplasm of colon: Secondary | ICD-10-CM | POA: Diagnosis not present

## 2013-07-23 DIAGNOSIS — I428 Other cardiomyopathies: Secondary | ICD-10-CM | POA: Diagnosis not present

## 2013-07-23 NOTE — Progress Notes (Signed)
Patient ID: [name], female   DOB: July 16, 1946, 67 y.o.   MRN: [id number] HPI: [name] is a 67 year old female with a past medical history of nonischemic cardiomyopathy with congestive heart failure status post ICD placement, GERD, hypothyroidism, dyslipidemia, borderline diabetes, and obesity who is seen in consultation at the request of Dr. Lenord Fellers discuss colon cancer screening. She is here alone today. She reports she is here to discuss colonoscopy for which she remains very hesitant due to the risks, specifically sedation. She recalls an unsedated flexible sigmoidoscopy about 10 years ago which was reportedly normal. She denies other complaint today. She reports no abdominal pain, diarrhea, constipation. No change in bowel habits. No blood in her stool or melena. Reports good appetite, no nausea or vomiting. No dysphagia. No significant heartburn on omeprazole. She denies a family history of colorectal cancer or polyps.  Past Medical History  Diagnosis Date  . Nonischemic cardiomyopathy     Normal coronary arteries, catheterization, September, 2009  . Systolic CHF, chronic   . LBBB (left bundle branch block)     old  . ICD (implantable cardiac defibrillator) in place     10/2009; Dr. Ladona Ridgel  . Hypothyroidism   . GERD (gastroesophageal reflux disease)   . Dyslipidemia   . Borderline diabetes   . Mitral regurgitation     Mild, echo, September, 2010  . Overweight(278.02)   . Ejection fraction < 50%     EF 25-30%, September, 2010    Past Surgical History  Procedure Laterality Date  . Eye surgery Left     x 3  . Tonsillectomy  1952  . Cardiac defibrillator placement    . Heart catherization      Current Outpatient Prescriptions  Medication Sig Dispense Refill  . aspirin 81 MG tablet Take 81 mg by mouth daily.       . carvedilol (COREG) 25 MG tablet TAKE 1 TABLET (25 MG TOTAL) BY MOUTH 2 (TWO) TIMES DAILY WITH A MEAL.  60 tablet  3  . Chlorphen-Pseudoephed-APAP (TYLENOL  ALLERGY SINUS PO) Take by mouth as needed.        . Cholecalciferol (VITAMIN D PO) Take by mouth daily.        . citalopram (CELEXA) 40 MG tablet TAKE 1 TABLET BY MOUTH EVERY DAY  30 tablet  6  . clobetasol (TEMOVATE) 0.05 % external solution       . CRESTOR 10 MG tablet TAKE 1 TABLET EVERY DAY  30 tablet  3  . FLUOCINOLONE ACETONIDE SCALP 0.01 % external oil       . furosemide (LASIX) 40 MG tablet TAKE 1 TABLET BY MOUTH DAILY.  30 tablet  5  . ibuprofen (ADVIL,MOTRIN) 200 MG tablet Take 200 mg by mouth every 6 (six) hours as needed.        Marland Kitchen levothyroxine (SYNTHROID, LEVOTHROID) 112 MCG tablet Take 112 mcg by mouth daily before breakfast.      . metFORMIN (GLUCOPHAGE) 500 MG tablet TAKE 1 TABLET BY MOUTH TWICE A DAY  180 tablet  2  . omeprazole (PRILOSEC) 20 MG capsule TAKE 1 TABLET BY MOUTH EVERY DAY  90 capsule  3  . ramipril (ALTACE) 2.5 MG capsule TAKE 1 CAPSULE EVERY DAY  30 capsule  11  . spironolactone (ALDACTONE) 25 MG tablet Take 1 tablet (25 mg total) by mouth daily.  30 tablet  5   No current facility-administered medications for this visit.    No Known Allergies  Family History  Problem Relation Age of Onset  . Heart attack Mother   . Lung cancer Father   . Pancreatic cancer Father   . Colon cancer Neg Hx   . Diabetes Neg Hx   . Kidney disease Neg Hx   . Liver disease Neg Hx     History  Substance Use Topics  . Smoking status: Never Smoker   . Smokeless tobacco: Never Used  . Alcohol Use: Yes     Comment: 2 beers per week    ROS: As per history of present illness, otherwise negative  BP 110/60  Pulse 60  Ht 4\' 11"  (1.499 m)  Wt 211 lb 12.8 oz (96.072 kg)  BMI 42.76 kg/m2 Constitutional: Well-developed and well-nourished. No distress. HEENT: Normocephalic and atraumatic. Oropharynx is clear and moist. No oropharyngeal exudate. Conjunctivae are normal.  No scleral icterus. Neck: Neck supple. Trachea midline. Cardiovascular: Normal rate, regular rhythm and  intact distal pulses. ICD palpable left upper chest wall Pulmonary/chest: Effort normal and breath sounds normal. No wheezing, rales or rhonchi. Abdominal: Soft, nontender, nondistended. Bowel sounds active throughout.  Extremities: no clubbing, cyanosis, trace pretibial edema Neurological: Alert and oriented to person place and time. Skin: Skin is warm and dry. No rashes noted. Psychiatric: Normal mood and affect. Behavior is normal.  RELEVANT LABS AND IMAGING: CBC    Component Value Date/Time   WBC 4.2 04/16/2013 0920   RBC 3.97 04/16/2013 0920   HGB 12.6 04/16/2013 0920   HCT 36.7 04/16/2013 0920   PLT 285 04/16/2013 0920   MCV 92.4 04/16/2013 0920   MCH 31.7 04/16/2013 0920   MCHC 34.3 04/16/2013 0920   RDW 14.4 04/16/2013 0920   LYMPHSABS 2.0 04/16/2013 0920   MONOABS 0.4 04/16/2013 0920   EOSABS 0.2 04/16/2013 0920   BASOSABS 0.1 04/16/2013 0920    CMP     Component Value Date/Time   NA 140 04/16/2013 0920   K 3.9 04/16/2013 0920   CL 100 04/16/2013 0920   CO2 32 04/16/2013 0920   GLUCOSE 111* 04/16/2013 0920   BUN 20 04/16/2013 0920   CREATININE 0.71 04/16/2013 0920   CREATININE 0.7 10/27/2009 1421   CALCIUM 9.4 04/16/2013 0920   PROT 6.5 04/16/2013 0920   ALBUMIN 4.0 04/16/2013 0920   AST 15 04/16/2013 0920   ALT 17 04/16/2013 0920   ALKPHOS 72 04/16/2013 0920   BILITOT 0.6 04/16/2013 0920   GFRNONAA 89.71 10/27/2009 1421   GFRAA  Value: >60        The eGFR has been calculated using the MDRD equation. This calculation has not been validated in all clinical situations. eGFR's persistently <60 mL/min signify possible Chronic Kidney Disease. 12/02/2008 1635    ASSESSMENT/PLAN: 67 year old female with a past medical history of nonischemic cardiomyopathy with congestive heart failure status post ICD placement, GERD, hypothyroidism, dyslipidemia, borderline diabetes, and obesity who is seen in consultation at the request of Dr. Lenord Fellers discuss colon cancer screening.  1.  CRC screening -- with her history of heart  failure she is hesitant to undergo colonoscopy because of the sedation. We spent a great deal of time today discussing options for colorectal cancer screening. She reports that Dr. Lenord Fellers has had her home with FOBT cards annually but she has never completed them. We discussed current acceptable screening modalities for colorectal cancer which includes colonoscopy, flexible sigmoidoscopy, barium enema, CT colonoscopy, and occult blood testing.  We did discuss the new test which is just coming to market, Cologuard.  We've discussed  how Cologuard test both for occult blood but also DNA signatures that can be found and advanced adenomatous polyps and cancer.  There is still some unknown to this test but initial studies have indicated a good sensitivity and specificity. Given her extreme reluctance to colonoscopy at this time, I feel that this test is good for her because she is willing to perform it.  She does understand this test is positive that she would need a colonoscopy, and she reports if that were the case she would be okay proceeding to colonoscopy. This test will be ordered for her today, she take about a month for results to return, and I'll notify her with the results. She should call me with any questions or concerns. She voices understanding.

## 2013-07-23 NOTE — Patient Instructions (Signed)
You have signed a cologuard form. They will contact you to set up the testing.  There phone number is [phone]                                               We are excited to introduce MyChart, a new best-in-class service that provides you online access to important information in your electronic medical record. We want to make it easier for you to view your health information - all in one secure location - when and where you need it. We expect MyChart will enhance the quality of care and service we provide.  When you register for MyChart, you can:    View your test results.    Request appointments and receive appointment reminders via email.    Request medication renewals.    View your medical history, allergies, medications and immunizations.    Communicate with your physician's office through a password-protected site.    Conveniently print information such as your medication lists.  To find out if MyChart is right for you, please talk to a member of our clinical staff today. We will gladly answer your questions about this free health and wellness tool.  If you are age 67 or older and want a member of your family to have access to your record, you must provide written consent by completing a proxy form available at our office. Please speak to our clinical staff about guidelines regarding accounts for patients younger than age 31.  As you activate your MyChart account and need any technical assistance, please call the MyChart technical support line at [phone]) or email your question to [email or web address]. If you email your question(s), please include your name, a return phone number and the best time to reach you.  If you have non-urgent health-related questions, you can send a message to our office through MyChart at [email or web address]. If you have a medical emergency, call 911.  Thank you for using MyChart as your new health and wellness  resource!   MyChart licensed from [employer],  [id number]. Patents Pending.

## 2013-07-25 ENCOUNTER — Ambulatory Visit (INDEPENDENT_AMBULATORY_CARE_PROVIDER_SITE_OTHER): Payer: Medicare Other | Admitting: *Deleted

## 2013-07-25 DIAGNOSIS — I447 Left bundle-branch block, unspecified: Secondary | ICD-10-CM

## 2013-07-25 DIAGNOSIS — I428 Other cardiomyopathies: Secondary | ICD-10-CM | POA: Diagnosis not present

## 2013-07-25 DIAGNOSIS — I5022 Chronic systolic (congestive) heart failure: Secondary | ICD-10-CM

## 2013-07-25 DIAGNOSIS — I509 Heart failure, unspecified: Secondary | ICD-10-CM

## 2013-07-25 LAB — MDC_IDC_ENUM_SESS_TYPE_INCLINIC
Brady Statistic RV Percent Paced: 0 %
HighPow Impedance: 38 Ohm
HighPow Impedance: 57 Ohm
Implantable Pulse Generator Serial Number: 160860
Lead Channel Pacing Threshold Amplitude: 0.5 V
Lead Channel Pacing Threshold Amplitude: 0.6 V
Lead Channel Sensing Intrinsic Amplitude: 16.6 mV
Lead Channel Sensing Intrinsic Amplitude: 6.4 mV
Lead Channel Setting Pacing Amplitude: 2 V
Zone Setting Detection Interval: 273 ms
Zone Setting Detection Interval: 300 ms
Zone Setting Detection Interval: 353 ms

## 2013-07-25 NOTE — Progress Notes (Signed)
Device check in clinic, all functions normal, no changes made, full details in [facility]. 2 NSVT--both SVT.  ROV w/ device clinic 10/29/13.

## 2013-07-27 ENCOUNTER — Encounter: Payer: Self-pay | Admitting: Internal Medicine

## 2013-08-05 ENCOUNTER — Other Ambulatory Visit: Payer: Self-pay | Admitting: Cardiology

## 2013-10-08 ENCOUNTER — Other Ambulatory Visit: Payer: Self-pay | Admitting: Internal Medicine

## 2013-10-23 DIAGNOSIS — Z1211 Encounter for screening for malignant neoplasm of colon: Secondary | ICD-10-CM | POA: Diagnosis not present

## 2013-10-25 ENCOUNTER — Other Ambulatory Visit: Payer: Medicare Other | Admitting: Internal Medicine

## 2013-10-25 DIAGNOSIS — E039 Hypothyroidism, unspecified: Secondary | ICD-10-CM

## 2013-10-25 DIAGNOSIS — Z79899 Other long term (current) drug therapy: Secondary | ICD-10-CM | POA: Diagnosis not present

## 2013-10-25 DIAGNOSIS — E119 Type 2 diabetes mellitus without complications: Secondary | ICD-10-CM

## 2013-10-25 DIAGNOSIS — E785 Hyperlipidemia, unspecified: Secondary | ICD-10-CM

## 2013-10-25 LAB — TSH: TSH: 0.531 u[IU]/mL (ref 0.350–4.500)

## 2013-10-25 LAB — HEPATIC FUNCTION PANEL
ALT: 16 U/L (ref 0–35)
AST: 16 U/L (ref 0–37)
Albumin: 4.1 g/dL (ref 3.5–5.2)
Alkaline Phosphatase: 88 U/L (ref 39–117)
BILIRUBIN DIRECT: 0.1 mg/dL (ref 0.0–0.3)
BILIRUBIN INDIRECT: 0.6 mg/dL (ref 0.2–1.2)
Total Bilirubin: 0.7 mg/dL (ref 0.2–1.2)
Total Protein: 6.8 g/dL (ref 6.0–8.3)

## 2013-10-25 LAB — HEMOGLOBIN A1C
Hgb A1c MFr Bld: 5.9 % — ABNORMAL HIGH (ref <5.7)
Mean Plasma Glucose: 123 mg/dL — ABNORMAL HIGH (ref <117)

## 2013-10-25 LAB — LIPID PANEL
CHOL/HDL RATIO: 2.7 ratio
CHOLESTEROL: 150 mg/dL (ref 0–200)
HDL: 55 mg/dL (ref >39)
LDL Cholesterol: 77 mg/dL (ref 0–99)
Triglycerides: 91 mg/dL (ref <150)
VLDL: 18 mg/dL (ref 0–40)

## 2013-10-26 ENCOUNTER — Ambulatory Visit (INDEPENDENT_AMBULATORY_CARE_PROVIDER_SITE_OTHER): Payer: Medicare Other | Admitting: Internal Medicine

## 2013-10-26 ENCOUNTER — Encounter: Payer: Self-pay | Admitting: Internal Medicine

## 2013-10-26 VITALS — BP 88/58 | HR 72 | Temp 97.1°F | Wt 210.0 lb

## 2013-10-26 DIAGNOSIS — E119 Type 2 diabetes mellitus without complications: Secondary | ICD-10-CM | POA: Diagnosis not present

## 2013-10-26 DIAGNOSIS — K219 Gastro-esophageal reflux disease without esophagitis: Secondary | ICD-10-CM

## 2013-10-26 DIAGNOSIS — Z95810 Presence of automatic (implantable) cardiac defibrillator: Secondary | ICD-10-CM | POA: Diagnosis not present

## 2013-10-26 DIAGNOSIS — I428 Other cardiomyopathies: Secondary | ICD-10-CM | POA: Diagnosis not present

## 2013-10-26 DIAGNOSIS — Z23 Encounter for immunization: Secondary | ICD-10-CM | POA: Diagnosis not present

## 2013-10-26 DIAGNOSIS — E785 Hyperlipidemia, unspecified: Secondary | ICD-10-CM | POA: Diagnosis not present

## 2013-10-26 DIAGNOSIS — E039 Hypothyroidism, unspecified: Secondary | ICD-10-CM

## 2013-10-26 DIAGNOSIS — J309 Allergic rhinitis, unspecified: Secondary | ICD-10-CM

## 2013-10-26 MED ORDER — AZITHROMYCIN 250 MG PO TABS: ORAL_TABLET | ORAL | Status: DC

## 2013-10-26 MED ORDER — FLUTICASONE PROPIONATE 50 MCG/ACT NA SUSP: NASAL | Status: DC

## 2013-10-26 MED ORDER — PNEUMOCOCCAL VAC POLYVALENT 25 MCG/0.5ML IJ INJ: 0.5000 mL | INJECTION | INTRAMUSCULAR | Status: DC

## 2013-10-26 NOTE — Progress Notes (Signed)
   Subjective:    Patient ID: [name], female    DOB: 20-Oct-1945, 68 y.o.   MRN: [id number]  HPI 68 year old white female in today for six-month recheck. She has a history of an implantable cardiac defibrillator, nonischemic cardiomyopathy, hyperlipidemia, controlled type 2 diabetes, allergic rhinitis, GE reflux, hypothyroidism, obesity. She's been having a chronic sore throat now for several weeks when she awakens. Think she may have some postnasal drip. Sore throat gets better later in the day sometimes. She is to use Flonase nasal spray but hasn't had a prescription for that in some time. She does take PPI for GE reflux. She recently had hemoglobin A1c, lipid panel, liver functions and TSH all of which all within normal limits and are good results. She had flu vaccine at local pharmacy last fall. Needs Pneumovax immunization update today. She did see Dr. Hilarie Fredrickson regarding colon cancer screening. She had some concern about sedation with colonoscopy and she recently submitted Cologard stool samples instead.    Review of Systems     Objective:   Physical Exam HEENT exam: TMs and pharynx are clear. Neck is supple. No JVD thyromegaly or carotid bruits. Chest clear to auscultation. Cardiac exam regular rate and rhythm. Skin is warm and dry. Alert and oriented x3.       Assessment & Plan:  Nonischemic cardiomyopathy  Implantable cardiac fibrillation or  Hyperlipidemia  Controlled type 2 diabetes mellitus  Allergic rhinitis  GE reflux  Hypothyroidism  Obesity  Sore throat? Etiology unclear  Plan: We are going to try Zithromax Z-Pak and Flonase nasal spray. If symptoms persist after 2-3 weeks she is to see allergist. Pneumovax immunization update given today. Return in 6 months for physical examination.

## 2013-10-26 NOTE — Patient Instructions (Signed)
Continue same meds and return in 6 months. Try Z-Pak and Flonase. If symptoms persist with sore throat, see allergist.

## 2013-10-26 NOTE — Addendum Note (Signed)
Addended by: Brett Canales on: 10/26/2013 11:44 AM   Modules accepted: Orders

## 2013-10-29 ENCOUNTER — Ambulatory Visit (INDEPENDENT_AMBULATORY_CARE_PROVIDER_SITE_OTHER): Payer: Medicare Other | Admitting: *Deleted

## 2013-10-29 DIAGNOSIS — I509 Heart failure, unspecified: Secondary | ICD-10-CM | POA: Diagnosis not present

## 2013-10-29 DIAGNOSIS — I5022 Chronic systolic (congestive) heart failure: Secondary | ICD-10-CM | POA: Diagnosis not present

## 2013-10-29 DIAGNOSIS — R0989 Other specified symptoms and signs involving the circulatory and respiratory systems: Secondary | ICD-10-CM | POA: Diagnosis not present

## 2013-10-29 DIAGNOSIS — R9430 Abnormal result of cardiovascular function study, unspecified: Secondary | ICD-10-CM

## 2013-10-29 NOTE — Progress Notes (Signed)
ICD check in clinic. Normal device function. Thresholds and sensing consistent with previous device measurements. Impedance trends stable over time. 2 "NSVT" episodes---"8" sec dur. for both---EGMs indicate AT and true NSVT (x 10 bts @ 170bpm). No mode switches. Histogram distribution appropriate for patient and level of activity. No changes made this session. Device programmed at appropriate safety margins. Device programmed to optimize intrinsic conduction. Estimated longevity 8.5 years. Pt will follow up with [facility] in 3 months. Patient education completed including shock plan.

## 2013-10-31 LAB — MDC_IDC_ENUM_SESS_TYPE_INCLINIC
Brady Statistic RA Percent Paced: 0 %
Brady Statistic RV Percent Paced: 0 %
Date Time Interrogation Session: 20150216050000
HighPow Impedance: 38 Ohm
HighPow Impedance: 57 Ohm
Implantable Pulse Generator Serial Number: 160860
Lead Channel Impedance Value: 476 Ohm
Lead Channel Impedance Value: 587 Ohm
Lead Channel Pacing Threshold Amplitude: 0.5 V
Lead Channel Pacing Threshold Amplitude: 0.5 V
Lead Channel Pacing Threshold Pulse Width: 0.4 ms
Lead Channel Pacing Threshold Pulse Width: 0.4 ms
Lead Channel Sensing Intrinsic Amplitude: 17.5 mV
Lead Channel Sensing Intrinsic Amplitude: 5.2 mV
Lead Channel Setting Pacing Amplitude: 2 V
Lead Channel Setting Pacing Amplitude: 2.4 V
Lead Channel Setting Pacing Pulse Width: 0.4 ms
Lead Channel Setting Sensing Sensitivity: 0.6 mV
Zone Setting Detection Interval: 273 ms
Zone Setting Detection Interval: 300 ms
Zone Setting Detection Interval: 353 ms

## 2013-11-13 ENCOUNTER — Encounter: Payer: Self-pay | Admitting: Internal Medicine

## 2013-11-14 ENCOUNTER — Telehealth: Payer: Self-pay | Admitting: Gastroenterology

## 2013-11-14 ENCOUNTER — Other Ambulatory Visit: Payer: Self-pay | Admitting: Gastroenterology

## 2013-11-14 DIAGNOSIS — R195 Other fecal abnormalities: Secondary | ICD-10-CM

## 2013-11-14 MED ORDER — MOVIPREP 100 G PO SOLR: ORAL | Status: DC

## 2013-11-14 NOTE — Telephone Encounter (Signed)
Spoke to pt. Gave her results of cologuard test. Came back positive, let her know that her next step would be to have a colonoscopy. And Dr. Hilarie Fredrickson will do that at the hospital on November 28, 2013. I will mail her out instructions and she is to call me when she gets them so we can go over them pt verbalized understanding.

## 2013-11-15 ENCOUNTER — Encounter (HOSPITAL_COMMUNITY): Payer: Self-pay | Admitting: Pharmacy Technician

## 2013-11-15 ENCOUNTER — Other Ambulatory Visit: Payer: Self-pay | Admitting: Gastroenterology

## 2013-11-16 ENCOUNTER — Encounter (HOSPITAL_COMMUNITY): Payer: Self-pay | Admitting: *Deleted

## 2013-11-28 ENCOUNTER — Telehealth: Payer: Self-pay

## 2013-11-28 NOTE — Telephone Encounter (Signed)
Patient is aware that her procedure has been relocated to [facility] for 12:30 tomorrow.  She is asked to arrive at 11:15 and check in at admitting at the [facility].  Patient is agreeable to the plan.

## 2013-11-29 ENCOUNTER — Encounter (HOSPITAL_COMMUNITY): Payer: Self-pay

## 2013-11-29 ENCOUNTER — Encounter (HOSPITAL_COMMUNITY)
Admission: RE | Disposition: A | Discharge status: Home / Self Care | Payer: Self-pay | Source: Ambulatory Visit | Attending: Internal Medicine

## 2013-11-29 ENCOUNTER — Ambulatory Visit (HOSPITAL_COMMUNITY): Admit: 2013-11-29 | Payer: Self-pay | Admitting: Internal Medicine

## 2013-11-29 ENCOUNTER — Ambulatory Visit (HOSPITAL_COMMUNITY): Payer: Medicare Other | Admitting: Certified Registered Nurse Anesthetist

## 2013-11-29 ENCOUNTER — Encounter (HOSPITAL_COMMUNITY): Payer: Medicare Other | Admitting: Certified Registered Nurse Anesthetist

## 2013-11-29 ENCOUNTER — Ambulatory Visit (HOSPITAL_COMMUNITY): Admission: RE | Admit: 2013-11-29 | Payer: Medicare Other | Source: Ambulatory Visit | Admitting: Internal Medicine

## 2013-11-29 ENCOUNTER — Ambulatory Visit (HOSPITAL_COMMUNITY)
Admission: RE | Admit: 2013-11-29 | Discharge: 2013-11-29 | Disposition: A | Discharge status: Home / Self Care | Payer: Medicare Other | Source: Ambulatory Visit | Attending: Internal Medicine | Admitting: Internal Medicine

## 2013-11-29 DIAGNOSIS — R7309 Other abnormal glucose: Secondary | ICD-10-CM | POA: Insufficient documentation

## 2013-11-29 DIAGNOSIS — Z95810 Presence of automatic (implantable) cardiac defibrillator: Secondary | ICD-10-CM | POA: Insufficient documentation

## 2013-11-29 DIAGNOSIS — R195 Other fecal abnormalities: Secondary | ICD-10-CM

## 2013-11-29 DIAGNOSIS — I428 Other cardiomyopathies: Secondary | ICD-10-CM | POA: Insufficient documentation

## 2013-11-29 DIAGNOSIS — D126 Benign neoplasm of colon, unspecified: Secondary | ICD-10-CM

## 2013-11-29 DIAGNOSIS — K5730 Diverticulosis of large intestine without perforation or abscess without bleeding: Secondary | ICD-10-CM | POA: Insufficient documentation

## 2013-11-29 DIAGNOSIS — D371 Neoplasm of uncertain behavior of stomach: Secondary | ICD-10-CM | POA: Insufficient documentation

## 2013-11-29 DIAGNOSIS — E039 Hypothyroidism, unspecified: Secondary | ICD-10-CM | POA: Insufficient documentation

## 2013-11-29 DIAGNOSIS — K219 Gastro-esophageal reflux disease without esophagitis: Secondary | ICD-10-CM | POA: Insufficient documentation

## 2013-11-29 DIAGNOSIS — D378 Neoplasm of uncertain behavior of other specified digestive organs: Secondary | ICD-10-CM | POA: Diagnosis not present

## 2013-11-29 DIAGNOSIS — E669 Obesity, unspecified: Secondary | ICD-10-CM | POA: Insufficient documentation

## 2013-11-29 DIAGNOSIS — E785 Hyperlipidemia, unspecified: Secondary | ICD-10-CM | POA: Insufficient documentation

## 2013-11-29 DIAGNOSIS — I509 Heart failure, unspecified: Secondary | ICD-10-CM | POA: Insufficient documentation

## 2013-11-29 DIAGNOSIS — I5022 Chronic systolic (congestive) heart failure: Secondary | ICD-10-CM | POA: Insufficient documentation

## 2013-11-29 DIAGNOSIS — D375 Neoplasm of uncertain behavior of rectum: Principal | ICD-10-CM

## 2013-11-29 DIAGNOSIS — Z1211 Encounter for screening for malignant neoplasm of colon: Secondary | ICD-10-CM | POA: Diagnosis not present

## 2013-11-29 DIAGNOSIS — I447 Left bundle-branch block, unspecified: Secondary | ICD-10-CM | POA: Diagnosis not present

## 2013-11-29 HISTORY — PX: COLONOSCOPY: SHX5424

## 2013-11-29 HISTORY — DX: Presence of automatic (implantable) cardiac defibrillator: Z95.810

## 2013-11-29 SURGERY — COLONOSCOPY
Anesthesia: Monitor Anesthesia Care

## 2013-11-29 SURGERY — COLONOSCOPY WITH PROPOFOL
Anesthesia: Monitor Anesthesia Care

## 2013-11-29 MED ORDER — SPOT INK MARKER SYRINGE KIT
PACK | SUBMUCOSAL | Status: AC
  Filled 2013-11-29: qty 5

## 2013-11-29 MED ORDER — MIDAZOLAM HCL 5 MG/5ML IJ SOLN
INTRAMUSCULAR | Status: DC | PRN
  Administered 2013-11-29: 2 mg via INTRAVENOUS
  Administered 2013-11-29: 1 mg via INTRAVENOUS

## 2013-11-29 MED ORDER — SODIUM CHLORIDE 0.9 % IV SOLN: INTRAVENOUS | Status: DC

## 2013-11-29 MED ORDER — PROPOFOL INFUSION 10 MG/ML OPTIME
INTRAVENOUS | Status: DC | PRN
  Administered 2013-11-29: 100 ug/kg/min via INTRAVENOUS

## 2013-11-29 MED ORDER — FENTANYL CITRATE 0.05 MG/ML IJ SOLN
INTRAMUSCULAR | Status: DC | PRN
  Administered 2013-11-29 (×4): 25 ug via INTRAVENOUS

## 2013-11-29 MED ORDER — FENTANYL CITRATE 0.05 MG/ML IJ SOLN
INTRAMUSCULAR | Status: AC
  Filled 2013-11-29: qty 4

## 2013-11-29 MED ORDER — MIDAZOLAM HCL 5 MG/ML IJ SOLN
INTRAMUSCULAR | Status: AC
  Filled 2013-11-29: qty 1

## 2013-11-29 MED ORDER — LACTATED RINGERS IV SOLN
INTRAVENOUS | Status: DC | PRN
  Administered 2013-11-29: 13:00:00 via INTRAVENOUS

## 2013-11-29 MED ORDER — LACTATED RINGERS IV SOLN: INTRAVENOUS | Status: DC

## 2013-11-29 NOTE — H&P (Signed)
HPI: [name] is a 68 year old female with a past medical history of nonischemic cardiomyopathy with congestive heart failure status post ICD placement, GERD, hypothyroidism, dyslipidemia, borderline diabetes, and obesity who was seen in the office to discuss colon cancer screening. She elected for Cologuard, which was performed and positive. For this reason she presents today for outpatient colonoscopy with monitored anesthesia care. She's had no new symptoms including no change in bowel habits, rectal bleeding, diarrhea or constipation. No abdominal pain. She has never had colonoscopy.   Past Medical History  Diagnosis Date  . Nonischemic cardiomyopathy     Normal coronary arteries, catheterization, September, 2009  . LBBB (left bundle branch block)     old  . ICD (implantable cardiac defibrillator) in place     10/2009; Dr. Lovena Le  . Hypothyroidism   . GERD (gastroesophageal reflux disease)   . Dyslipidemia   . Borderline diabetes   . Mitral regurgitation     Mild, echo, September, 2010  . Overweight   . Ejection fraction < 50%     EF 25-30%, September, 2010  . Diabetes mellitus without complication     Borderline  . Systolic CHF, chronic     no recent problems  . Automatic implantable cardioverter-defibrillator in situ     Dr. Beckie Salts follows-next visit- [phone]    Past Surgical History  Procedure Laterality Date  . Tonsillectomy  1952  . Cardiac defibrillator placement    . Heart catherization    . Cardiac catheterization      '09 last  . Eye surgery Left     x 3-"droopy eyelid"    Current Facility-Administered Medications  Medication Dose Route Frequency Provider Last Rate Last Dose  . 0.9 %  sodium chloride infusion   Intravenous Continuous Jerene Bears, MD        No Known Allergies  Family History  Problem Relation Age of Onset  . Heart attack Mother   . Lung cancer Father   . Pancreatic cancer Father   . Colon cancer Neg Hx   . Diabetes Neg Hx   .  Kidney disease Neg Hx   . Liver disease Neg Hx     History  Substance Use Topics  . Smoking status: Never Smoker   . Smokeless tobacco: Never Used  . Alcohol Use: Yes     Comment: 2-4 beers per week    ROS: As per history of present illness, otherwise negative  BP 134/65  Pulse 68  Temp(Src) 97.5 F (36.4 C) (Oral)  Resp 15  Ht $R'4\' 11"'xX$  (1.499 m)  Wt 210 lb (95.255 kg)  BMI 42.39 kg/m2  SpO2 99% Gen: awake, alert, NAD HEENT: anicteric, op clear CV: RRR Pulm: CTA b/l Abd: soft, NT/ND, +BS throughout Ext: no c/c/e Neuro: nonfocal   RELEVANT LABS AND IMAGING: CBC    Component Value Date/Time   WBC 4.2 04/16/2013 0920   RBC 3.97 04/16/2013 0920   HGB 12.6 04/16/2013 0920   HCT 36.7 04/16/2013 0920   PLT 285 04/16/2013 0920   MCV 92.4 04/16/2013 0920   MCH 31.7 04/16/2013 0920   MCHC 34.3 04/16/2013 0920   RDW 14.4 04/16/2013 0920   LYMPHSABS 2.0 04/16/2013 0920   MONOABS 0.4 04/16/2013 0920   EOSABS 0.2 04/16/2013 0920   BASOSABS 0.1 04/16/2013 0920    CMP     Component Value Date/Time   NA 140 04/16/2013 0920   K 3.9 04/16/2013 0920   CL 100 04/16/2013 0920  CO2 32 04/16/2013 0920   GLUCOSE 111* 04/16/2013 0920   BUN 20 04/16/2013 0920   CREATININE 0.71 04/16/2013 0920   CREATININE 0.7 10/27/2009 1421   CALCIUM 9.4 04/16/2013 0920   PROT 6.8 10/25/2013 0905   ALBUMIN 4.1 10/25/2013 0905   AST 16 10/25/2013 0905   ALT 16 10/25/2013 0905   ALKPHOS 88 10/25/2013 0905   BILITOT 0.7 10/25/2013 0905   GFRNONAA 89.71 10/27/2009 1421   GFRAA  Value: >60        The eGFR has been calculated using the MDRD equation. This calculation has not been validated in all clinical situations. eGFR's persistently <60 mL/min signify possible Chronic Kidney Disease. 12/02/2008 1635    ASSESSMENT/PLAN: 68 year old female with a past medical history of nonischemic cardiomyopathy with congestive heart failure status post ICD placement, GERD, hypothyroidism, dyslipidemia, borderline diabetes, and obesity who was seen in the  office to discuss colon cancer screening.  1. CRC screening after + Cologuard -- we discussed the positive DNA stool testing and high likelihood of polyps. Plan for colonoscopy today.  The nature of the procedure, as well as the risks, benefits, and alternatives were carefully and thoroughly reviewed with the patient. Ample time for discussion and questions allowed. The patient understood, was satisfied, and agreed to proceed.

## 2013-11-29 NOTE — Op Note (Signed)
[facility] [place], [address]   COLONOSCOPY PROCEDURE REPORT  PATIENT: [name], [name]  MR#: [id number] BIRTHDATE: 12-15-1945 , 67  yrs. old GENDER: Female ENDOSCOPIST: Jerene Bears, MD REFERRED AC:ZYSA Parke Simmers, M.D. PROCEDURE DATE:  11/29/2013 PROCEDURE:   Colonoscopy with snare polypectomy and Submucosal injection, any substance First Screening Colonoscopy - Avg.  risk and is 50 yrs.  old or older - No.  Prior Negative Screening - Now for repeat screening. N/A  History of Adenoma - Now for follow-up colonoscopy & has been > or = to 3 yrs.  N/A  Polyps Removed Today? Yes. ASA CLASS:   Class III INDICATIONS:heme-positive stool (positive Cologuard), colon cancer screening. MEDICATIONS: MAC sedation, administered by CRNA and See Anesthesia Report.  DESCRIPTION OF PROCEDURE:   After the risks benefits and alternatives of the procedure were thoroughly explained, informed consent was obtained.  A digital rectal exam revealed no rectal mass.   The Pentax Ped Colon [id number]  endoscope was introduced through the anus and advanced to the cecum, which was identified by both the appendix and ileocecal valve. No adverse events experienced.   The quality of the prep was good, using MoviPrep The instrument was then slowly withdrawn as the colon was fully examined.   COLON FINDINGS: A sessile polyp measuring 10-15 mm in size with a mucous cap was found in the ascending colon.  This polyp was behind a fold and difficult to see initially.  A piecemeal polypectomy was performed using snare cautery followed by cold forceps at the edges in attempt to ensure complete resection.  The resection was felt to be complete and the polyp tissue was completely retrieved.  A tattoo with Niger Ink was applied beside the polypectomy base. Moderate diverticulosis was noted in the descending colon and sigmoid colon.  No other polyps were seen. Retroflexed  views revealed no abnormalities.    .  The scope was withdrawn and the procedure completed. COMPLICATIONS: There were no complications.  ENDOSCOPIC IMPRESSION: 1.   Sessile polyp measuring 10-15 mm in size was found in the ascending colon; polypectomy was performed using snare cautery and with cold forceps; a tattoo was applied 2.   Moderate diverticulosis was noted in the descending colon and sigmoid colon  RECOMMENDATIONS: 1.  Hold aspirin, aspirin products, and anti-inflammatory medication for 2 weeks. 2.  High fiber diet 3.  Office follow-up in 1-2 months to discuss repeat colonoscopy interval given piecemeal resection   eSigned:  Jerene Bears, MD 11/29/2013 1:51 PM cc: The Patient and Emeline General, MD

## 2013-11-29 NOTE — Discharge Instructions (Signed)
YOU HAD AN ENDOSCOPIC PROCEDURE TODAY: Refer to the procedure report that was given to you for any specific questions about what was found during the examination.  If the procedure report does not answer your questions, please call your gastroenterologist to clarify.  YOU SHOULD EXPECT: Some feelings of bloating in the abdomen. Passage of more gas than usual.  Walking can help get rid of the air that was put into your GI tract during the procedure and reduce the bloating. If you had a lower endoscopy (such as a colonoscopy or flexible sigmoidoscopy) you may notice spotting of blood in your stool or on the toilet paper.   DIET: Your first meal following the procedure should be a light meal and then it is ok to progress to your normal diet.  A half-sandwich or bowl of soup is an example of a good first meal.  Heavy or fried foods are harder to digest and may make you feel nasueas or bloated.  Drink plenty of fluids but you should avoid alcoholic beverages for 24 hours.  ACTIVITY: Your care partner should take you home directly after the procedure.  You should plan to take it easy, moving slowly for the rest of the day.  You can resume normal activity the day after the procedure however you should NOT DRIVE or use heavy machinery for 24 hours (because of the sedation medicines used during the test).    SYMPTOMS TO REPORT IMMEDIATELY  A gastroenterologist can be reached at any hour.  Please call your doctor's office for any of the following symptoms:   Following lower endoscopy (colonoscopy, flexible sigmoidoscopy)  Excessive amounts of blood in the stool  Significant tenderness, worsening of abdominal pains  Swelling of the abdomen that is new, acute  Fever of 100 or higher  Following upper endoscopy (EGD, EUS, ERCP)  Vomiting of blood or coffee ground material  New, significant abdominal pain  New, significant chest pain or pain under the shoulder blades  Painful or persistently difficult  swallowing  New shortness of breath  Black, tarry-looking stools  FOLLOW UP: If any biopsies were taken you will be contacted by phone or by letter within the next 1-3 weeks.  Call your gastroenterologist if you have not heard about the biopsies in 3 weeks.  Please also call your gastroenterologist's office with any specific questions about appointments or follow up tests. Colonoscopy, Care After Refer to this sheet in the next few weeks. These instructions provide you with information on caring for yourself after your procedure. Your health care provider may also give you more specific instructions. Your treatment has been planned according to current medical practices, but problems sometimes occur. Call your health care provider if you have any problems or questions after your procedure. WHAT TO EXPECT AFTER THE PROCEDURE  After your procedure, it is typical to have the following:  A small amount of blood in your stool.  Moderate amounts of gas and mild abdominal cramping or bloating. HOME CARE INSTRUCTIONS  Do not drive, operate machinery, or sign important documents for 24 hours.  You may shower and resume your regular physical activities, but move at a slower pace for the first 24 hours.  Take frequent rest periods for the first 24 hours.  Walk around or put a warm pack on your abdomen to help reduce abdominal cramping and bloating.  Drink enough fluids to keep your urine clear or pale yellow.  You may resume your normal diet as instructed by your health  care provider. Avoid heavy or fried foods that are hard to digest. °· Avoid drinking alcohol for 24 hours or as instructed by your health care provider. °· Only take over-the-counter or prescription medicines as directed by your health care provider. °· If a tissue sample (biopsy) was taken during your procedure: °· Do not take aspirin or blood thinners for 7 days, or as instructed by your health care provider. °· Do not drink alcohol  for 7 days, or as instructed by your health care provider. °· Eat soft foods for the first 24 hours. °SEEK MEDICAL CARE IF: °You have persistent spotting of blood in your stool 2 3 days after the procedure. °SEEK IMMEDIATE MEDICAL CARE IF: °· You have more than a small spotting of blood in your stool. °· You pass large blood clots in your stool. °· Your abdomen is swollen (distended). °· You have nausea or vomiting. °· You have a fever. °· You have increasing abdominal pain that is not relieved with medicine. °Document Released: 04/13/2004 Document Revised: 06/20/2013 Document Reviewed: 05/07/2013 °[employer]® Patient Information ©2014 [employer]. ° °

## 2013-11-29 NOTE — Anesthesia Preprocedure Evaluation (Addendum)
Anesthesia Evaluation  Patient identified by MRN, date of birth, ID band Patient awake    Reviewed: Allergy & Precautions, H&P , NPO status , Patient's Chart, lab work & pertinent test results  Airway Mallampati: II TM Distance: >3 FB Neck ROM: Full    Dental  (+) Teeth Intact, Dental Advisory Given   Pulmonary  breath sounds clear to auscultation        Cardiovascular +CHF + dysrhythmias + Cardiac Defibrillator Rhythm:Regular Rate:Normal     Neuro/Psych    GI/Hepatic GERD-  ,  Endo/Other  diabetes, Type 2, Oral Hypoglycemic AgentsHypothyroidism Morbid obesity  Renal/GU      Musculoskeletal   Abdominal (+)  Abdomen: soft. Bowel sounds: normal.  Peds  Hematology   Anesthesia Other Findings   Reproductive/Obstetrics                       Anesthesia Physical Anesthesia Plan  ASA: III  Anesthesia Plan: MAC and General   Post-op Pain Management:    Induction: Intravenous  Airway Management Planned: Mask and LMA  Additional Equipment:   Intra-op Plan:   Post-operative Plan:   Informed Consent: I have reviewed the patients History and Physical, chart, labs and discussed the procedure including the risks, benefits and alternatives for the proposed anesthesia with the patient or authorized representative who has indicated his/her understanding and acceptance.     Plan Discussed with: CRNA and Anesthesiologist  Anesthesia Plan Comments:        Anesthesia Quick Evaluation

## 2013-11-29 NOTE — Preoperative (Signed)
Beta Blockers   Reason not to administer Beta Blockers:Not Applicable 

## 2013-11-29 NOTE — Transfer of Care (Signed)
Immediate Anesthesia Transfer of Care Note  Patient: [name]  Procedure(s) Performed: Procedure(s): COLONOSCOPY (N/A)  Patient Location: Endoscopy Unit  Anesthesia Type:General  Level of Consciousness: awake, alert  and oriented  Airway & Oxygen Therapy: Patient Spontanous Breathing  Post-op Assessment: Report given to PACU RN  Post vital signs: stable  Complications: No apparent anesthesia complications

## 2013-11-29 NOTE — Anesthesia Postprocedure Evaluation (Signed)
  Anesthesia Post-op Note  Patient: [name]  Procedure(s) Performed: Procedure(s): COLONOSCOPY (N/A)  Patient Location: Endoscopy suite  Anesthesia Type:General  Level of Consciousness: awake, alert  and oriented  Airway and Oxygen Therapy: Patient Spontanous Breathing  Post-op Pain: none  Post-op Assessment: Post-op Vital signs reviewed, Patient's Cardiovascular Status Stable, Respiratory Function Stable, Patent Airway, No signs of Nausea or vomiting and Pain level controlled  Post-op Vital Signs: stable  Complications: No apparent anesthesia complications

## 2013-11-29 NOTE — Anesthesia Procedure Notes (Signed)
Procedure Name: MAC Date/Time: 11/29/2013 1:02 PM Performed by: Maeola Harman Pre-anesthesia Checklist: Patient identified, Emergency Drugs available, Suction available, Patient being monitored and Timeout performed Patient Re-evaluated:Patient Re-evaluated prior to inductionOxygen Delivery Method: Simple face mask Preoxygenation: Pre-oxygenation with 100% oxygen Intubation Type: IV induction Placement Confirmation: breath sounds checked- equal and bilateral and positive ETCO2 Dental Injury: Teeth and Oropharynx as per pre-operative assessment

## 2013-11-29 NOTE — Anesthesia Postprocedure Evaluation (Signed)
  Anesthesia Post-op Note  Patient: [name]  Procedure(s) Performed: Procedure(s): COLONOSCOPY (N/A)  Patient Location: Endoscopy Unit  Anesthesia Type:General  Level of Consciousness: awake, alert  and oriented  Airway and Oxygen Therapy: Patient connected to face mask oxygen  Post-op Pain: none  Post-op Assessment: Post-op Vital signs reviewed and Patient's Cardiovascular Status Stable  Post-op Vital Signs: stable  Complications: No apparent anesthesia complications

## 2013-11-30 LAB — POCT I-STAT 4, (NA,K, GLUC, HGB,HCT)
GLUCOSE: 105 mg/dL — AB (ref 70–99)
HEMATOCRIT: 39 % (ref 36.0–46.0)
Hemoglobin: 13.3 g/dL (ref 12.0–15.0)
Potassium: 4.5 mEq/L (ref 3.7–5.3)
Sodium: 139 mEq/L (ref 137–147)

## 2013-12-03 ENCOUNTER — Encounter (HOSPITAL_COMMUNITY): Payer: Self-pay | Admitting: Internal Medicine

## 2013-12-04 ENCOUNTER — Other Ambulatory Visit: Payer: Self-pay | Admitting: Cardiovascular Disease

## 2013-12-05 ENCOUNTER — Encounter: Payer: Self-pay | Admitting: Internal Medicine

## 2013-12-14 DIAGNOSIS — E119 Type 2 diabetes mellitus without complications: Secondary | ICD-10-CM | POA: Diagnosis not present

## 2013-12-14 DIAGNOSIS — H2510 Age-related nuclear cataract, unspecified eye: Secondary | ICD-10-CM | POA: Diagnosis not present

## 2013-12-14 DIAGNOSIS — H40019 Open angle with borderline findings, low risk, unspecified eye: Secondary | ICD-10-CM | POA: Diagnosis not present

## 2013-12-14 DIAGNOSIS — H25019 Cortical age-related cataract, unspecified eye: Secondary | ICD-10-CM | POA: Diagnosis not present

## 2013-12-14 DIAGNOSIS — H5210 Myopia, unspecified eye: Secondary | ICD-10-CM | POA: Diagnosis not present

## 2013-12-14 DIAGNOSIS — H43819 Vitreous degeneration, unspecified eye: Secondary | ICD-10-CM | POA: Diagnosis not present

## 2013-12-14 LAB — HM DIABETES EYE EXAM

## 2013-12-16 ENCOUNTER — Other Ambulatory Visit: Payer: Self-pay | Admitting: Cardiology

## 2014-01-07 ENCOUNTER — Other Ambulatory Visit: Payer: Self-pay | Admitting: Internal Medicine

## 2014-01-24 ENCOUNTER — Encounter: Payer: Self-pay | Admitting: Internal Medicine

## 2014-01-28 ENCOUNTER — Ambulatory Visit: Payer: Medicare Other | Admitting: Internal Medicine

## 2014-01-28 ENCOUNTER — Encounter: Payer: Self-pay | Admitting: Internal Medicine

## 2014-01-28 VITALS — BP 112/74 | HR 80 | Ht 59.25 in | Wt 209.4 lb

## 2014-01-28 DIAGNOSIS — K5730 Diverticulosis of large intestine without perforation or abscess without bleeding: Secondary | ICD-10-CM | POA: Insufficient documentation

## 2014-01-28 DIAGNOSIS — D126 Benign neoplasm of colon, unspecified: Secondary | ICD-10-CM

## 2014-01-28 NOTE — Progress Notes (Signed)
   Subjective:    Patient ID: [name], female    DOB: [birth year], 68 y.o.   MRN: [id number]  HPI Mrs. Thatch is a 68 year old female with a past medical history of nonischemic cardiomyopathy with congestive heart failure status post ICD placement, GERD, hypothyroidism, dyslipidemia, borderline diabetes, and obesity who was initially seen in November 2014 discuss screening colonoscopy. Due to her medical comorbidities the decision was made to proceed with Cologuard.  This test was performed and positive and therefore she came for colonoscopy on 11/29/2013. This revealed a 10-15 mm sessile polyp in the ascending colon with a mucous. This polyp was initially difficult to see and was removed in piecemeal fashion using snare cautery and cold forceps. A tattoo was placed next to the resection base. Colonoscopy also notable for moderate diverticulosis in the left colon. She is here today after this procedure. She reports she did well following the colonoscopy with absolutely no complication or complaint. Today she denies abdominal pain. Reports she is feeling well. No blood in her stool or melena. No diarrhea or constipation. She is eating well without nausea or vomiting.  Review of Systems As per history of present illness, otherwise negative  Current Medications, Allergies, Past Medical History, Past Surgical History, Family History and Social History were reviewed in [employer] record.     Objective:   Physical Exam BP 112/74  Pulse 80  Ht 4' 11.25" (1.505 m)  Wt 209 lb 6 oz (94.972 kg)  BMI 41.93 kg/m2 Constitutional: Well-developed and well-nourished. No distress. Psychiatric: Normal mood and affect. Behavior is normal.  Colonoscopy and pathology report reviewed and discussed with patient in detail    Assessment & Plan:   68 year old female with a past medical history of nonischemic cardiomyopathy with congestive heart failure status post ICD placement, GERD,  hypothyroidism, dyslipidemia, borderline diabetes, obesity, and adenomatous colon polyp who is seen for followup.   1.  Sessile serrated adenoma, piecemeal resection, right colon -- she is doing well and did well with her colonoscopy. We discussed the pathology findings of sessile serrated adenoma without cytologic dysplasia. We discussed how this is a precancerous lesion and given the fact that it was removed piecemeal I have recommended repeat colonoscopy to ensure complete resection. She would like to wait a year before we repeat colonoscopy to ensure resection. We discussed how this is usually done in 3-6 months, but how 6-12 months is acceptable. Given her request we will plan to repeat colonoscopy in March 2016 in hospital setting with monitored anesthesia care. I asked that she notify me should she develop any GI symptoms, including abdominal pain, rectal bleeding or melena prior to this procedure. She voices understanding  2.  Diverticulosis -- we discussed diverticulosis and on the whole the rarity of complication such as diverticulitis or diverticular hemorrhage.

## 2014-01-28 NOTE — Patient Instructions (Signed)
You are due for a recall colonoscopy in march of 2016. We will send you a letter once the date approaches to remind you

## 2014-01-29 ENCOUNTER — Ambulatory Visit (INDEPENDENT_AMBULATORY_CARE_PROVIDER_SITE_OTHER): Payer: Medicare Other | Admitting: Internal Medicine

## 2014-01-29 ENCOUNTER — Encounter: Payer: Self-pay | Admitting: Internal Medicine

## 2014-01-29 VITALS — BP 132/81 | HR 65 | Ht 59.5 in | Wt 208.1 lb

## 2014-01-29 DIAGNOSIS — I428 Other cardiomyopathies: Secondary | ICD-10-CM | POA: Diagnosis not present

## 2014-01-29 DIAGNOSIS — Z95810 Presence of automatic (implantable) cardiac defibrillator: Secondary | ICD-10-CM

## 2014-01-29 DIAGNOSIS — I5022 Chronic systolic (congestive) heart failure: Secondary | ICD-10-CM | POA: Diagnosis not present

## 2014-01-29 DIAGNOSIS — I509 Heart failure, unspecified: Secondary | ICD-10-CM

## 2014-01-29 LAB — MDC_IDC_ENUM_SESS_TYPE_INCLINIC
Battery Remaining Percentage: 100 %
Brady Statistic RA Percent Paced: 0 %
Brady Statistic RV Percent Paced: 0 %
Date Time Interrogation Session: 20150519120235
HighPow Impedance: 57 Ohm
Implantable Pulse Generator Serial Number: 160860
Lead Channel Impedance Value: 454 Ohm
Lead Channel Pacing Threshold Amplitude: 0.6 V
Lead Channel Pacing Threshold Pulse Width: 0.4 ms
Lead Channel Setting Pacing Amplitude: 2 V
Lead Channel Setting Pacing Amplitude: 2.4 V
Lead Channel Setting Sensing Sensitivity: 0.6 mV
MDC IDC MSMT BATTERY REMAINING LONGEVITY: 102 mo
MDC IDC MSMT LEADCHNL RV IMPEDANCE VALUE: 611 Ohm
MDC IDC MSMT LEADCHNL RV PACING THRESHOLD AMPLITUDE: 0.6 V
MDC IDC MSMT LEADCHNL RV PACING THRESHOLD PULSEWIDTH: 0.4 ms
MDC IDC SET LEADCHNL RV PACING PULSEWIDTH: 0.4 ms
MDC IDC SET ZONE DETECTION INTERVAL: 273 ms
MDC IDC SET ZONE DETECTION INTERVAL: 300 ms
Zone Setting Detection Interval: 353 ms

## 2014-01-29 NOTE — Assessment & Plan Note (Signed)
Her symptoms remain class 2. She will continue her current meds and maintain a low sodium diet.  

## 2014-01-29 NOTE — Assessment & Plan Note (Signed)
Her [employer] Sci device is working normally. Will recheck in several months. 

## 2014-01-29 NOTE — Patient Instructions (Signed)
Your physician recommends that you schedule a follow-up appointment in: 3 months is the device clinic and 12 months with Dr Lovena Le

## 2014-01-29 NOTE — Progress Notes (Signed)
HPI [name] returns today for followup. She is a very pleasant 68 year old woman with a nonischemic cardiomyopathy, chronic class II systolic heart failure, obesity, status post ICD implantation. In the interim, she has done well. She denies chest pain, shortness of breath, or syncope. No recent ICD shocks. No peripheral edema.  No Known Allergies   Current Outpatient Prescriptions  Medication Sig Dispense Refill  . aspirin 81 MG tablet Take 81 mg by mouth daily.       . carvedilol (COREG) 25 MG tablet TAKE 1 TABLET (25 MG TOTAL) BY MOUTH 2 (TWO) TIMES DAILY WITH A MEAL.  60 tablet  3  . Cholecalciferol (VITAMIN D PO) Take 2 tablets by mouth daily.       . citalopram (CELEXA) 40 MG tablet Take 40 mg by mouth every evening.      . furosemide (LASIX) 40 MG tablet TAKE 1 TABLET BY MOUTH DAILY.  30 tablet  5  . ibuprofen (ADVIL,MOTRIN) 200 MG tablet Take 400 mg by mouth every 6 (six) hours as needed for mild pain or moderate pain.       Marland Kitchen levothyroxine (SYNTHROID, LEVOTHROID) 125 MCG tablet TAKE 1 TABLET (125 MCG TOTAL) BY MOUTH DAILY.  90 tablet  1  . metFORMIN (GLUCOPHAGE) 500 MG tablet Take 500 mg by mouth 2 (two) times daily with a meal.      . omeprazole (PRILOSEC) 20 MG capsule Take 20 mg by mouth daily.      . ramipril (ALTACE) 2.5 MG capsule Take 2.5 mg by mouth every evening.      Marland Kitchen spironolactone (ALDACTONE) 25 MG tablet TAKE 1 TABLET (25 MG TOTAL) BY MOUTH DAILY.  30 tablet  5   Current Facility-Administered Medications  Medication Dose Route Frequency Provider Last Rate Last Dose  . pneumococcal 23 valent vaccine (PNU-IMMUNE) injection 0.5 mL  0.5 mL Intramuscular Tomorrow-1000 Elby Showers, MD         Past Medical History  Diagnosis Date  . Nonischemic cardiomyopathy     Normal coronary arteries, catheterization, September, 2009  . LBBB (left bundle branch block)     old  . ICD (implantable cardiac defibrillator) in place     10/2009; Dr. Lovena Le  . Hypothyroidism   .  GERD (gastroesophageal reflux disease)   . Dyslipidemia   . Borderline diabetes   . Mitral regurgitation     Mild, echo, September, 2010  . Overweight   . Ejection fraction < 50%     EF 25-30%, September, 2010  . Diabetes mellitus without complication     Borderline  . Systolic CHF, chronic     no recent problems  . Automatic implantable cardioverter-defibrillator in situ     Dr. Beckie Salts follows-next visit- 5'15    ROS:   All systems reviewed and negative except as noted in the HPI.   Past Surgical History  Procedure Laterality Date  . Tonsillectomy  1952  . Cardiac defibrillator placement    . Heart catherization    . Cardiac catheterization      '09 last  . Eye surgery Left     x 3-"droopy eyelid"  . Colonoscopy N/A 11/29/2013    Procedure: COLONOSCOPY;  Surgeon: Jerene Bears, MD;  Location: [facility];  Service: Gastroenterology;  Laterality: N/A;     Family History  Problem Relation Age of Onset  . Heart attack Mother   . Lung cancer Father   . Pancreatic cancer Father   . Colon cancer  Neg Hx   . Diabetes Neg Hx   . Kidney disease Neg Hx   . Liver disease Neg Hx      History   Social History  . Marital Status: Single    Spouse Name: N/A    Number of Children: N/A  . Years of Education: N/A   Occupational History  . Not on file.   Social History Main Topics  . Smoking status: Never Smoker   . Smokeless tobacco: Never Used  . Alcohol Use: Yes     Comment: 2-4 beers per week  . Drug Use: No  . Sexual Activity: Not Currently   Other Topics Concern  . Not on file   Social History Narrative  . No narrative on file     BP 132/81  Pulse 65  Ht 4' 11.5" (1.511 m)  Wt 208 lb 1.9 oz (94.403 kg)  BMI 41.35 kg/m2  SpO2 97%  Physical Exam:  Well appearing middle-age obese woman, NAD HEENT: Unremarkable Neck: 7 cm jugulovenous distention, no thyromegaly Back:  No CVA tenderness Lungs:  Clear with no wheezes, rales, or rhonchi. HEART:   Regular rate rhythm, no murmurs, no rubs, no clicks Abd:  soft, positive bowel sounds, no organomegally, no rebound, no guarding Ext:  2 plus pulses, no edema, no cyanosis, no clubbing Skin:  No rashes no nodules Neuro:  CN II through XII intact, motor grossly intact  DEVICE  Normal device function.  See PaceArt for details.   Assess/Plan:

## 2014-03-07 ENCOUNTER — Other Ambulatory Visit: Payer: Self-pay | Admitting: Internal Medicine

## 2014-03-26 ENCOUNTER — Ambulatory Visit (INDEPENDENT_AMBULATORY_CARE_PROVIDER_SITE_OTHER): Payer: Medicare Other | Admitting: Internal Medicine

## 2014-03-26 ENCOUNTER — Encounter: Payer: Self-pay | Admitting: Internal Medicine

## 2014-03-26 VITALS — BP 96/50 | HR 80 | Temp 98.9°F | Wt 208.0 lb

## 2014-03-26 DIAGNOSIS — B029 Zoster without complications: Secondary | ICD-10-CM

## 2014-03-26 MED ORDER — VALACYCLOVIR HCL 1 G PO TABS: ORAL_TABLET | ORAL | Status: DC

## 2014-03-26 NOTE — Progress Notes (Signed)
   Subjective:    Patient ID: [name], female    DOB: 09/16/1945, 68 y.o.   MRN: [id number]  HPI  She noticed several vesicular lesions on Sunday, July 12. Has had some pain in her left back extending into left axillary line area starting on Friday, July 10. Says she had shingles over 40 years ago. Think she has shingles again. Has never had Zostavax vaccine.  Has had recent colonoscopy. Says she's due for repeat colonoscopy February 2016 because of a large serated sessile polyp. This was benign.    Review of Systems     Objective:   Physical Exam  Several  resolving vesicular lesions starting left back near left axillary line extending into axillary line falling within the bra line. These are consistent with Herpes zoster.       Assessment & Plan:  Herpes zoster  Plan: Valtrex 1 g 3 times daily for 7 days. Keep lesions clean and dry and apply Calamine lotion to lesions 3 times daily. Patient declined offer for pain medication.

## 2014-03-26 NOTE — Patient Instructions (Signed)
Take Valtrex 1000 mg 3 times daily for 7 days. Apply calamine lotion to shingles lesions 2-3 times daily.

## 2014-04-03 ENCOUNTER — Other Ambulatory Visit: Payer: Self-pay | Admitting: Cardiology

## 2014-04-29 ENCOUNTER — Other Ambulatory Visit: Payer: Medicare Other | Admitting: Internal Medicine

## 2014-04-29 DIAGNOSIS — Z79899 Other long term (current) drug therapy: Secondary | ICD-10-CM

## 2014-04-29 DIAGNOSIS — Z130 Encounter for screening for diseases of the blood and blood-forming organs and certain disorders involving the immune mechanism: Secondary | ICD-10-CM

## 2014-04-29 DIAGNOSIS — E785 Hyperlipidemia, unspecified: Secondary | ICD-10-CM | POA: Diagnosis not present

## 2014-04-29 DIAGNOSIS — E119 Type 2 diabetes mellitus without complications: Secondary | ICD-10-CM

## 2014-04-29 DIAGNOSIS — E039 Hypothyroidism, unspecified: Secondary | ICD-10-CM

## 2014-04-29 LAB — LIPID PANEL
Cholesterol: 164 mg/dL (ref 0–200)
HDL: 60 mg/dL (ref >39)
LDL Cholesterol: 85 mg/dL (ref 0–99)
TRIGLYCERIDES: 95 mg/dL (ref <150)
Total CHOL/HDL Ratio: 2.7 Ratio
VLDL: 19 mg/dL (ref 0–40)

## 2014-04-29 LAB — CBC WITH DIFFERENTIAL/PLATELET
Basophils Absolute: 0 10*3/uL (ref 0.0–0.1)
Basophils Relative: 1 % (ref 0–1)
Eosinophils Absolute: 0.1 10*3/uL (ref 0.0–0.7)
Eosinophils Relative: 3 % (ref 0–5)
HEMATOCRIT: 35.7 % — AB (ref 36.0–46.0)
HEMOGLOBIN: 12.4 g/dL (ref 12.0–15.0)
LYMPHS ABS: 1.9 10*3/uL (ref 0.7–4.0)
Lymphocytes Relative: 46 % (ref 12–46)
MCH: 33.2 pg (ref 26.0–34.0)
MCHC: 34.7 g/dL (ref 30.0–36.0)
MCV: 95.7 fL (ref 78.0–100.0)
MONO ABS: 0.4 10*3/uL (ref 0.1–1.0)
Monocytes Relative: 9 % (ref 3–12)
NEUTROS PCT: 41 % — AB (ref 43–77)
Neutro Abs: 1.7 10*3/uL (ref 1.7–7.7)
Platelets: 318 10*3/uL (ref 150–400)
RBC: 3.73 MIL/uL — ABNORMAL LOW (ref 3.87–5.11)
RDW: 15.7 % — ABNORMAL HIGH (ref 11.5–15.5)
WBC: 4.1 10*3/uL (ref 4.0–10.5)

## 2014-04-29 LAB — COMPREHENSIVE METABOLIC PANEL
ALBUMIN: 4.1 g/dL (ref 3.5–5.2)
ALT: 18 U/L (ref 0–35)
AST: 16 U/L (ref 0–37)
Alkaline Phosphatase: 63 U/L (ref 39–117)
BUN: 20 mg/dL (ref 6–23)
CO2: 33 mEq/L — ABNORMAL HIGH (ref 19–32)
Calcium: 9.5 mg/dL (ref 8.4–10.5)
Chloride: 98 mEq/L (ref 96–112)
Creat: 0.75 mg/dL (ref 0.50–1.10)
GLUCOSE: 113 mg/dL — AB (ref 70–99)
POTASSIUM: 4.2 meq/L (ref 3.5–5.3)
SODIUM: 138 meq/L (ref 135–145)
TOTAL PROTEIN: 6.5 g/dL (ref 6.0–8.3)
Total Bilirubin: 0.6 mg/dL (ref 0.2–1.2)

## 2014-04-29 LAB — HEMOGLOBIN A1C
Hgb A1c MFr Bld: 6.4 % — ABNORMAL HIGH (ref <5.7)
MEAN PLASMA GLUCOSE: 137 mg/dL — AB (ref <117)

## 2014-04-30 ENCOUNTER — Encounter: Payer: Self-pay | Admitting: Internal Medicine

## 2014-04-30 ENCOUNTER — Ambulatory Visit (INDEPENDENT_AMBULATORY_CARE_PROVIDER_SITE_OTHER): Payer: Medicare Other | Admitting: Internal Medicine

## 2014-04-30 ENCOUNTER — Other Ambulatory Visit (HOSPITAL_COMMUNITY)
Admission: RE | Admit: 2014-04-30 | Discharge: 2014-04-30 | Disposition: A | Discharge status: Home / Self Care | Payer: Medicare Other | Source: Ambulatory Visit | Attending: Internal Medicine | Admitting: Internal Medicine

## 2014-04-30 VITALS — BP 120/80 | HR 80 | Temp 98.1°F | Ht 59.0 in | Wt 208.0 lb

## 2014-04-30 DIAGNOSIS — B353 Tinea pedis: Secondary | ICD-10-CM | POA: Diagnosis not present

## 2014-04-30 DIAGNOSIS — E785 Hyperlipidemia, unspecified: Secondary | ICD-10-CM | POA: Diagnosis not present

## 2014-04-30 DIAGNOSIS — I10 Essential (primary) hypertension: Secondary | ICD-10-CM

## 2014-04-30 DIAGNOSIS — Z0000 Encounter for general adult medical examination without abnormal findings: Secondary | ICD-10-CM | POA: Diagnosis not present

## 2014-04-30 DIAGNOSIS — Z95810 Presence of automatic (implantable) cardiac defibrillator: Secondary | ICD-10-CM | POA: Diagnosis not present

## 2014-04-30 DIAGNOSIS — E119 Type 2 diabetes mellitus without complications: Secondary | ICD-10-CM

## 2014-04-30 DIAGNOSIS — K219 Gastro-esophageal reflux disease without esophagitis: Secondary | ICD-10-CM

## 2014-04-30 DIAGNOSIS — Z8659 Personal history of other mental and behavioral disorders: Secondary | ICD-10-CM

## 2014-04-30 DIAGNOSIS — I428 Other cardiomyopathies: Secondary | ICD-10-CM | POA: Diagnosis not present

## 2014-04-30 DIAGNOSIS — E8881 Metabolic syndrome: Secondary | ICD-10-CM

## 2014-04-30 DIAGNOSIS — Z124 Encounter for screening for malignant neoplasm of cervix: Secondary | ICD-10-CM | POA: Insufficient documentation

## 2014-04-30 LAB — POCT URINALYSIS DIPSTICK
BILIRUBIN UA: NEGATIVE
GLUCOSE UA: NEGATIVE
Ketones, UA: NEGATIVE
LEUKOCYTES UA: NEGATIVE
NITRITE UA: NEGATIVE
Protein, UA: NEGATIVE
Spec Grav, UA: 1.015
Urobilinogen, UA: NEGATIVE
pH, UA: 6

## 2014-04-30 LAB — TSH: TSH: 0.14 u[IU]/mL — ABNORMAL LOW (ref 0.350–4.500)

## 2014-04-30 MED ORDER — CITALOPRAM HYDROBROMIDE 40 MG PO TABS
40.0000 mg | ORAL_TABLET | Freq: Every evening | ORAL | Status: DC
Start: 2014-04-30 — End: 2015-05-03

## 2014-04-30 MED ORDER — ECONAZOLE NITRATE 1 % EX CREA: TOPICAL_CREAM | Freq: Every day | CUTANEOUS | Status: DC

## 2014-04-30 NOTE — Patient Instructions (Addendum)
Use Spectazole cream on feet. Take Accu-Chek at least once daily .

## 2014-05-01 ENCOUNTER — Ambulatory Visit (INDEPENDENT_AMBULATORY_CARE_PROVIDER_SITE_OTHER): Payer: Medicare Other | Admitting: *Deleted

## 2014-05-01 DIAGNOSIS — I428 Other cardiomyopathies: Secondary | ICD-10-CM | POA: Diagnosis not present

## 2014-05-01 LAB — MDC_IDC_ENUM_SESS_TYPE_INCLINIC
Brady Statistic RV Percent Paced: 0 %
HighPow Impedance: 38 Ohm
HighPow Impedance: 62 Ohm
Implantable Pulse Generator Serial Number: 160860
Lead Channel Impedance Value: 634 Ohm
Lead Channel Pacing Threshold Amplitude: 0.5 V
Lead Channel Sensing Intrinsic Amplitude: 18.9 mV
Lead Channel Sensing Intrinsic Amplitude: 5.9 mV
Lead Channel Setting Pacing Amplitude: 2 V
Lead Channel Setting Pacing Amplitude: 2.4 V
Lead Channel Setting Sensing Sensitivity: 0.6 mV
MDC IDC MSMT LEADCHNL RA IMPEDANCE VALUE: 587 Ohm
MDC IDC MSMT LEADCHNL RA PACING THRESHOLD PULSEWIDTH: 0.4 ms
MDC IDC MSMT LEADCHNL RV PACING THRESHOLD AMPLITUDE: 0.4 V
MDC IDC MSMT LEADCHNL RV PACING THRESHOLD PULSEWIDTH: 0.4 ms
MDC IDC SESS DTM: 20150819040000
MDC IDC SET LEADCHNL RV PACING PULSEWIDTH: 0.4 ms
MDC IDC SET ZONE DETECTION INTERVAL: 273 ms
MDC IDC STAT BRADY RA PERCENT PACED: 0 %
Zone Setting Detection Interval: 300 ms
Zone Setting Detection Interval: 353 ms

## 2014-05-01 NOTE — Progress Notes (Signed)
ICD check in clinic. Normal device function. Thresholds and sensing consistent with previous device measurements. Impedance trends stable over time. No evidence of any ventricular arrhythmias.  1 SVT episode.   No mode switches. Histogram distribution appropriate for patient and level of activity. No changes made this session. Device programmed at appropriate safety margins. Device programmed to optimize intrinsic conduction. Estimated longevity 8 years.  Patient education completed including shock plan. Alert tones/vibration demonstrated for patient.  ROV in November with the device clinic.

## 2014-05-02 LAB — CYTOLOGY - PAP

## 2014-05-20 ENCOUNTER — Encounter: Payer: Self-pay | Admitting: Internal Medicine

## 2014-05-28 ENCOUNTER — Telehealth: Payer: Self-pay

## 2014-05-28 NOTE — Telephone Encounter (Signed)
Patient called requesting an order for diabetic test strips and lancets.  Hand written rx faxed to [facility] on [address].  Patient aware.

## 2014-05-29 ENCOUNTER — Other Ambulatory Visit: Payer: Self-pay

## 2014-05-29 MED ORDER — LANCETS MISC: Status: AC

## 2014-05-29 MED ORDER — GLUCOSE BLOOD VI STRP: ORAL_STRIP | Status: AC

## 2014-06-03 ENCOUNTER — Ambulatory Visit: Payer: Self-pay | Admitting: Internal Medicine

## 2014-06-10 NOTE — Progress Notes (Signed)
Subjective:    Patient ID: [name], female    DOB: 1946/05/13, 68 y.o.   MRN: [id number]  HPI  68 year old White Female in today for health maintenance exam and evaluation of medical issues. And is, hyperlipidemia, GE reflux, hypothyroidism, nonischemic cardiomyopathy, cardiac defibrillator, mitral regurgitation, left bundle branch block, obesity, hypertension, history of depression. She doesn't exercise very much because of cardiomyopathy. She does not smoke. Will occasionally drink beer.  No known drug allergies  Has a living will  Dislocated right shoulder December 2001. Multiple surgeries as a child. Tonsillectomy at age 68. History of allergic rhinitis and occasionally gets acute bronchospasm.  Social history: She is retired and formerly worked at [employer]. Prior to that she worked at [employer]. Patient is single and completed 4 years of college.  Family history: Father died of lung cancer in his 58s. Mother died at age 57 of an MI with history of possible diabetes. No siblings. No children.  Ophthalmologist is Dr. Herbert Deaner.    Review of Systems  Constitutional: Positive for fatigue.  Respiratory: Negative.   Cardiovascular: Negative for chest pain.  Gastrointestinal:       History of GE reflux  Endocrine:       Hypothyroidism  Neurological: Negative.   Psychiatric/Behavioral:       History of depression       Objective:   Physical Exam  Vitals reviewed. Constitutional: She is oriented to person, place, and time. She appears well-developed and well-nourished. No distress.  HENT:  Head: Normocephalic and atraumatic.  Right Ear: External ear normal.  Left Ear: External ear normal.  Mouth/Throat: Oropharynx is clear and moist. No oropharyngeal exudate.  Eyes: Conjunctivae and EOM are normal. Pupils are equal, round, and reactive to light. Right eye exhibits no discharge. No scleral icterus.  Neck: Neck supple. No JVD present. No  thyromegaly present.  Cardiovascular: Normal rate, regular rhythm, normal heart sounds and intact distal pulses.   No murmur heard. Pulmonary/Chest: Effort normal and breath sounds normal. She has no wheezes. She has no rales.  Breasts normal female  Abdominal: Soft. Bowel sounds are normal. She exhibits no distension and no mass. There is no tenderness. There is no rebound and no guarding.  Musculoskeletal: Normal range of motion. She exhibits no edema.  Lymphadenopathy:    She has no cervical adenopathy.  Neurological: She is alert and oriented to person, place, and time. She has normal reflexes. No cranial nerve deficit. Coordination normal.  Skin: Skin is dry. No rash noted. She is not diaphoretic.  Tinea pedis  Psychiatric: She has a normal mood and affect. Her behavior is normal. Judgment and thought content normal.          Assessment & Plan:  Nonischemic cardiomyopathy with defibrillator  Hypertension  Hyperlipidemia  Metabolic syndrome  Controlled type 2 diabetes  Allergic rhinitis  Hypothyroidism  GERD  History of depression  Obesity  Tinea pedis  Plan: Continue same medications and return in 6 months. Encouraged to walk when she can. No change in medications. Use Spectazole cream on feet  Subjective:   Patient presents for Medicare Annual/Subsequent preventive examination.  Review Past Medical/Family/Social: See above   Risk Factors  Current exercise habits: Walks sometimes Dietary issues discussed: Low-fat low-carb  Cardiac risk factors:  Depression Screen  (Note: if answer to either of the following is "Yes", a more complete depression screening is indicated)   Over the past two weeks, have you felt down, depressed  or hopeless? No  Over the past two weeks, have you felt little interest or pleasure in doing things? No Have you lost interest or pleasure in daily life? No Do you often feel hopeless? No Do you cry easily over simple problems? No    Activities of Daily Living  In your present state of health, do you have any difficulty performing the following activities?:   Driving? No  Managing money? No  Feeding yourself? No  Getting from bed to chair? No  Climbing a flight of stairs? No  Preparing food and eating?: No  Bathing or showering? No  Getting dressed: No  Getting to the toilet? No  Using the toilet:No  Moving around from place to place: No  In the past year have you fallen or had a near fall?:No  Are you sexually active? No  Do you have more than one partner? No   Hearing Difficulties: No  Do you often ask people to speak up or repeat themselves? yes Do you experience ringing or noises in your ears? No  Do you have difficulty understanding soft or whispered voices? yes Do you feel that you have a problem with memory? No Do you often misplace items? No    Home Safety:  Do you have a smoke alarm at your residence? Yes Do you have grab bars in the bathroom? Do you have throw rugs in your house?   Cognitive Testing  Alert? Yes Normal Appearance?Yes  Oriented to person? Yes Place? Yes  Time? Yes  Recall of three objects? Yes  Can perform simple calculations? Yes  Displays appropriate judgment?Yes  Can read the correct time from a watch face?Yes   List the Names of Other Physician/Practitioners you currently use:  See referral list for the physicians patient is currently seeing.   Dr. Herbert Deaner  Cardiologist  Review of Systems: See above   Objective:     General appearance: Appears stated age and mildly obese  Head: Normocephalic, without obvious abnormality, atraumatic  Eyes: conj clear, EOMi PEERLA  Ears: normal TM's and external ear canals both ears  Nose: Nares normal. Septum midline. Mucosa normal. No drainage or sinus tenderness.  Throat: lips, mucosa, and tongue normal; teeth and gums normal  Neck: no adenopathy, no carotid bruit, no JVD, supple, symmetrical, trachea midline and thyroid  not enlarged, symmetric, no tenderness/mass/nodules  No CVA tenderness.  Lungs: clear to auscultation bilaterally  Breasts: normal appearance, no masses or tenderness. Implantable defibrillator Heart: regular rate and rhythm, S1, S2 normal, no murmur, click, rub or gallop  Abdomen: soft, non-tender; bowel sounds normal; no masses, no organomegaly  Musculoskeletal: ROM normal in all joints, no crepitus, no deformity, Normal muscle strengthen. Back  is symmetric, no curvature. Skin: Skin color, texture, turgor normal. No rashes or lesions  Lymph nodes: Cervical, supraclavicular, and axillary nodes normal.  Neurologic: CN 2 -12 Normal, Normal symmetric reflexes. Normal coordination and gait  Psych: Alert & Oriented x 3, Mood appear stable.    Assessment:    Annual wellness medicare exam   Plan:    During the course of the visit the patient was educated and counseled about appropriate screening and preventive services including:   Annual mammogram  Annual diabetic eye exam     Patient Instructions (the written plan) was given to the patient.  Medicare Attestation  I have personally reviewed:  The patient's medical and social history  Their use of alcohol, tobacco or illicit drugs  Their current medications and supplements  The patient's functional ability including ADLs,fall risks, home safety risks, cognitive, and hearing and visual impairment  Diet and physical activities  Evidence for depression or mood disorders  The patient's weight, height, BMI, and visual acuity have been recorded in the chart. I have made referrals, counseling, and provided education to the patient based on review of the above and I have provided the patient with a written personalized care plan for preventive services.

## 2014-06-17 ENCOUNTER — Ambulatory Visit: Payer: Self-pay | Admitting: Internal Medicine

## 2014-06-17 ENCOUNTER — Other Ambulatory Visit: Payer: Self-pay | Admitting: Cardiology

## 2014-06-19 ENCOUNTER — Other Ambulatory Visit: Payer: Self-pay

## 2014-06-19 MED ORDER — SPIRONOLACTONE 25 MG PO TABS: ORAL_TABLET | ORAL | Status: DC

## 2014-06-21 ENCOUNTER — Encounter: Payer: Self-pay | Admitting: Cardiology

## 2014-06-21 ENCOUNTER — Ambulatory Visit (INDEPENDENT_AMBULATORY_CARE_PROVIDER_SITE_OTHER): Payer: Medicare Other | Admitting: Cardiology

## 2014-06-21 VITALS — BP 104/72 | HR 72 | Ht 59.0 in | Wt 207.8 lb

## 2014-06-21 DIAGNOSIS — R0989 Other specified symptoms and signs involving the circulatory and respiratory systems: Secondary | ICD-10-CM | POA: Diagnosis not present

## 2014-06-21 DIAGNOSIS — IMO0002 Reserved for concepts with insufficient information to code with codable children: Secondary | ICD-10-CM

## 2014-06-21 DIAGNOSIS — I5022 Chronic systolic (congestive) heart failure: Secondary | ICD-10-CM

## 2014-06-21 DIAGNOSIS — I447 Left bundle-branch block, unspecified: Secondary | ICD-10-CM | POA: Diagnosis not present

## 2014-06-21 DIAGNOSIS — Z95810 Presence of automatic (implantable) cardiac defibrillator: Secondary | ICD-10-CM | POA: Diagnosis not present

## 2014-06-21 DIAGNOSIS — I429 Cardiomyopathy, unspecified: Secondary | ICD-10-CM | POA: Diagnosis not present

## 2014-06-21 DIAGNOSIS — R9430 Abnormal result of cardiovascular function study, unspecified: Secondary | ICD-10-CM

## 2014-06-21 DIAGNOSIS — I428 Other cardiomyopathies: Secondary | ICD-10-CM

## 2014-06-21 NOTE — Assessment & Plan Note (Signed)
Left bundle branch block is old. No change in therapy.

## 2014-06-21 NOTE — Patient Instructions (Signed)
Your physician recommends that you continue on your current medications as directed. Please refer to the Current Medication list given to you today.  Your physician has requested that you have an echocardiogram. Echocardiography is a painless test that uses sound waves to create images of your heart. It provides your doctor with information about the size and shape of your heart and how well your heart's chambers and valves are working. This procedure takes approximately one hour. There are no restrictions for this procedure.  Your physician wants you to follow-up in: 1 year. You will receive a reminder letter in the mail two months in advance. If you don't receive a letter, please call our office to schedule the follow-up appointment.

## 2014-06-21 NOTE — Assessment & Plan Note (Addendum)
Her volume status is stable. She has some mild increase in shortness of breath. I'm not convinced this is related to volume. Followup 2-D echo will be done to reassess her LV. Have encouraged her to try to lose a few pounds.  The patient is aware that I will be retiring in September, 2016. She is aware that her next general cardiology followup will be with a new physician with in our group.

## 2014-06-21 NOTE — Assessment & Plan Note (Signed)
His been 5 years since her last echo. We will arrange for an echo and I will be in touch with her.

## 2014-06-21 NOTE — Progress Notes (Signed)
Patient ID: [name], female   DOB: May 12, 1946, 68 y.o.   MRN: [id number]    HPI  Patient is seen to followup for cardiomyopathy. I saw her last a year ago. She saw Dr. Lovena Le for follow up of her ICD. She has had some mild increase in shortness of breath nothing major. She does not have any significant edema. She has gained true body weight. Her last echo was actually 5 years ago.  No Known Allergies  Current Outpatient Prescriptions  Medication Sig Dispense Refill  . aspirin 81 MG tablet Take 81 mg by mouth daily.       . carvedilol (COREG) 25 MG tablet TAKE 1 TABLET (25 MG TOTAL) BY MOUTH 2 (TWO) TIMES DAILY WITH A MEAL.  60 tablet  5  . Cholecalciferol (VITAMIN D PO) Take 2 tablets by mouth daily.       . citalopram (CELEXA) 40 MG tablet Take 1 tablet (40 mg total) by mouth every evening.  90 tablet  3  . econazole nitrate 1 % cream Apply topically daily.  15 g  3  . furosemide (LASIX) 40 MG tablet TAKE 1 TABLET BY MOUTH DAILY.  30 tablet  0  . glucose blood (ACCU-CHEK AVIVA) test strip Check daily.  For Diabetes 250.00  100 each  12  . ibuprofen (ADVIL,MOTRIN) 200 MG tablet Take 400 mg by mouth every 6 (six) hours as needed for mild pain or moderate pain.       . Lancets MISC Check glucose daily.  Diabetes 250.00  100 each  11  . levothyroxine (SYNTHROID, LEVOTHROID) 125 MCG tablet TAKE 1 TABLET (125 MCG TOTAL) BY MOUTH DAILY.  90 tablet  1  . metFORMIN (GLUCOPHAGE) 500 MG tablet Take 500 mg by mouth 2 (two) times daily with a meal.      . omeprazole (PRILOSEC) 20 MG capsule TAKE ONE CAPSULE BY MOUTH EVERY DAY  90 capsule  3  . ramipril (ALTACE) 2.5 MG capsule Take 2.5 mg by mouth every evening.      Marland Kitchen spironolactone (ALDACTONE) 25 MG tablet TAKE 1 TABLET (25 MG TOTAL) BY MOUTH DAILY.  30 tablet  1   No current facility-administered medications for this visit.    History   Social History  . Marital Status: Single    Spouse Name: N/A    Number of Children: N/A  . Years of  Education: N/A   Occupational History  . Not on file.   Social History Main Topics  . Smoking status: Never Smoker   . Smokeless tobacco: Never Used  . Alcohol Use: Yes     Comment: 2-4 beers per week  . Drug Use: No  . Sexual Activity: Not Currently   Other Topics Concern  . Not on file   Social History Narrative  . No narrative on file    Family History  Problem Relation Age of Onset  . Heart attack Mother   . Lung cancer Father   . Pancreatic cancer Father   . Colon cancer Neg Hx   . Diabetes Neg Hx   . Kidney disease Neg Hx   . Liver disease Neg Hx     Past Medical History  Diagnosis Date  . Nonischemic cardiomyopathy     Normal coronary arteries, catheterization, September, 2009  . LBBB (left bundle branch block)     old  . ICD (implantable cardiac defibrillator) in place     10/2009; Dr. Lovena Le  . Hypothyroidism   .  GERD (gastroesophageal reflux disease)   . Dyslipidemia   . Borderline diabetes   . Mitral regurgitation     Mild, echo, September, 2010  . Overweight(278.02)   . Ejection fraction < 50%     EF 25-30%, September, 2010  . Diabetes mellitus without complication     Borderline  . Systolic CHF, chronic     no recent problems  . Automatic implantable cardioverter-defibrillator in situ     Dr. Beckie Salts follows-next visit- [phone]    Past Surgical History  Procedure Laterality Date  . Tonsillectomy  1952  . Cardiac defibrillator placement    . Heart catherization    . Cardiac catheterization      '09 last  . Eye surgery Left     x 3-"droopy eyelid"  . Colonoscopy N/A 11/29/2013    Procedure: COLONOSCOPY;  Surgeon: Jerene Bears, MD;  Location: [facility];  Service: Gastroenterology;  Laterality: N/A;    Patient Active Problem List   Diagnosis Date Noted  . Diverticulosis of colon without hemorrhage 01/28/2014  . Serrated adenoma of colon 01/28/2014  . Nonspecific abnormal finding in stool contents 11/29/2013  . Colon cancer screening  11/29/2013  . History of depression 08/11/2012  . Nonischemic cardiomyopathy   . Systolic CHF, chronic   . LBBB (left bundle branch block)   . Automatic implantable cardioverter-defibrillator in situ   . Hypothyroidism   . GERD (gastroesophageal reflux disease)   . Dyslipidemia   . Type 2 diabetes mellitus   . Mitral regurgitation   . Overweight   . Ejection fraction < 50%     ROS   Patient denies fever, chills, headache, sweats, rash, change in vision, change in hearing, chest pain, cough, nausea vomiting, urinary symptoms. All other systems are reviewed and are negative.  PHYSICAL EXAM  Patient is overweight. She has gained weight since last year. It does not appear to be volume. Head is atraumatic. Sclerae conjunctiva are normal. There is no jugulovenous distention. Lungs are clear. Respiratory effort is nonlabored. Cardiac exam reveals S1 and S2. The abdomen is protuberant but soft. There is no peripheral edema.  Filed Vitals:   06/21/14 1118  BP: 104/72  Pulse: 72  Height: 4\' 11"  (1.499 m)  Weight: 207 lb 12.8 oz (94.257 kg)   EKG is done today and reviewed by me. She has old left bundle branch block. There sinus rhythm.  ASSESSMENT & PLAN

## 2014-06-21 NOTE — Assessment & Plan Note (Signed)
Her ICD is in place and followed by electrophysiology.

## 2014-06-21 NOTE — Assessment & Plan Note (Signed)
She is on the optimal dose of medications that she can tolerate at this time. No change therapy.

## 2014-06-24 ENCOUNTER — Encounter: Payer: Self-pay | Admitting: Cardiology

## 2014-06-24 ENCOUNTER — Other Ambulatory Visit (HOSPITAL_COMMUNITY): Payer: Medicare Other | Admitting: Cardiology

## 2014-06-24 ENCOUNTER — Ambulatory Visit (HOSPITAL_COMMUNITY): Payer: Medicare Other | Attending: Cardiology | Admitting: Radiology

## 2014-06-24 ENCOUNTER — Ambulatory Visit (INDEPENDENT_AMBULATORY_CARE_PROVIDER_SITE_OTHER): Payer: Medicare Other | Admitting: Internal Medicine

## 2014-06-24 ENCOUNTER — Encounter: Payer: Self-pay | Admitting: Internal Medicine

## 2014-06-24 VITALS — BP 100/60 | HR 82 | Temp 97.9°F | Ht 59.75 in | Wt 208.0 lb

## 2014-06-24 DIAGNOSIS — I429 Cardiomyopathy, unspecified: Secondary | ICD-10-CM | POA: Diagnosis not present

## 2014-06-24 DIAGNOSIS — I10 Essential (primary) hypertension: Secondary | ICD-10-CM

## 2014-06-24 DIAGNOSIS — Z23 Encounter for immunization: Secondary | ICD-10-CM | POA: Diagnosis not present

## 2014-06-24 DIAGNOSIS — E669 Obesity, unspecified: Secondary | ICD-10-CM | POA: Diagnosis not present

## 2014-06-24 DIAGNOSIS — E119 Type 2 diabetes mellitus without complications: Secondary | ICD-10-CM | POA: Insufficient documentation

## 2014-06-24 DIAGNOSIS — Z95810 Presence of automatic (implantable) cardiac defibrillator: Secondary | ICD-10-CM

## 2014-06-24 DIAGNOSIS — I447 Left bundle-branch block, unspecified: Secondary | ICD-10-CM

## 2014-06-24 DIAGNOSIS — R079 Chest pain, unspecified: Secondary | ICD-10-CM | POA: Diagnosis not present

## 2014-06-24 DIAGNOSIS — I428 Other cardiomyopathies: Secondary | ICD-10-CM

## 2014-06-24 DIAGNOSIS — R9430 Abnormal result of cardiovascular function study, unspecified: Secondary | ICD-10-CM

## 2014-06-24 MED ORDER — RAMIPRIL 2.5 MG PO CAPS: 2.5000 mg | ORAL_CAPSULE | Freq: Every evening | ORAL | Status: DC

## 2014-06-24 MED ORDER — PERFLUTREN LIPID MICROSPHERE
5.0000 mL | Freq: Once | INTRAVENOUS | Status: AC
  Administered 2014-06-24: 5 mL via INTRAVENOUS

## 2014-06-24 NOTE — Patient Instructions (Signed)
Continue same medications. Diet exercise and weight loss. Continue to monitor Accu-Cheks.

## 2014-06-24 NOTE — Progress Notes (Addendum)
Echocardiogram performed with Definity.  

## 2014-06-24 NOTE — Progress Notes (Signed)
   Subjective:    Patient ID: [name], female    DOB: Jul 06, 1946, 68 y.o.   MRN: [id number]  HPI At last visit in August her hemoglobin A1c had crept up to 6.4%. She's been keeping Accu-Cheks which are now within normal limits. Highest Accu-Chek that I saw which she recorded was 120. His been checking before breakfast and before supper. Blood pressure is stable. Says cardiologist to do 2-D echocardiogram in the near future. Flu vaccine given today.    Review of Systems     Objective:   Physical Exam  Not examined. Spent 15 minutes taken with patient about blood pressure, diet and exercise, weight control, glucose management.      Assessment & Plan:  Type 2 diabetes mellitus-Accu-Cheks have improved. Hemoglobin A1c checked today  Hypertension-stable  Cardiomyopathy-stable  Health maintenance-flu vaccine given  Plan: Will return in March for six-month recheck. Had physical examination August 2015

## 2014-06-25 ENCOUNTER — Telehealth: Payer: Self-pay

## 2014-06-25 LAB — HEMOGLOBIN A1C
Hgb A1c MFr Bld: 6.2 % — ABNORMAL HIGH (ref <5.7)
Mean Plasma Glucose: 131 mg/dL — ABNORMAL HIGH (ref <117)

## 2014-06-25 NOTE — Telephone Encounter (Signed)
Message copied by Amado Coe on Tue Jun 25, 2014  1:58 PM ------      Message from: Elby Showers      Created: Tue Jun 25, 2014 10:37 AM       Please call pt . Hgb AIC is 6.2 % Continue working with diet and exercise. Was 6.4 %. Want to see at less than 6% again. ------

## 2014-06-25 NOTE — Telephone Encounter (Signed)
Patient aware of lab results and directions. 

## 2014-07-04 ENCOUNTER — Other Ambulatory Visit: Payer: Self-pay | Admitting: Internal Medicine

## 2014-07-13 ENCOUNTER — Other Ambulatory Visit: Payer: Self-pay | Admitting: Cardiology

## 2014-08-05 ENCOUNTER — Ambulatory Visit (INDEPENDENT_AMBULATORY_CARE_PROVIDER_SITE_OTHER): Payer: Medicare Other | Admitting: *Deleted

## 2014-08-05 DIAGNOSIS — I429 Cardiomyopathy, unspecified: Secondary | ICD-10-CM

## 2014-08-05 DIAGNOSIS — I428 Other cardiomyopathies: Secondary | ICD-10-CM

## 2014-08-05 LAB — MDC_IDC_ENUM_SESS_TYPE_INCLINIC
Brady Statistic RV Percent Paced: 1 %
Date Time Interrogation Session: 20151123050000
HIGH POWER IMPEDANCE MEASURED VALUE: 38 Ohm
HighPow Impedance: 59 Ohm
Lead Channel Impedance Value: 539 Ohm
Lead Channel Impedance Value: 604 Ohm
Lead Channel Pacing Threshold Amplitude: 0.5 V
Lead Channel Pacing Threshold Amplitude: 0.6 V
Lead Channel Pacing Threshold Pulse Width: 0.4 ms
Lead Channel Sensing Intrinsic Amplitude: 17.3 mV
Lead Channel Setting Pacing Amplitude: 2.4 V
Lead Channel Setting Sensing Sensitivity: 0.6 mV
MDC IDC MSMT LEADCHNL RA PACING THRESHOLD PULSEWIDTH: 0.4 ms
MDC IDC MSMT LEADCHNL RA SENSING INTR AMPL: 6.7 mV
MDC IDC PG SERIAL: 160860
MDC IDC SET LEADCHNL RA PACING AMPLITUDE: 2 V
MDC IDC SET LEADCHNL RV PACING PULSEWIDTH: 0.4 ms
MDC IDC SET ZONE DETECTION INTERVAL: 273 ms
MDC IDC STAT BRADY RA PERCENT PACED: 1 %
Zone Setting Detection Interval: 300 ms
Zone Setting Detection Interval: 353 ms

## 2014-08-05 NOTE — Progress Notes (Signed)
ICD check in clinic. Normal device function. Thresholds and sensing consistent with previous device measurements. Impedance trends stable over time. 2 NSVT episodes. No mode switches. Histogram distribution appropriate for patient and level of activity. No changes made this session. Device programmed at appropriate safety margins. Device programmed to optimize intrinsic conduction. Estimated longevity 7.5 years.  Patient education completed including shock plan. Alert tones/vibration demonstrated for patient.  ROV in February with the device clinic.

## 2014-08-16 ENCOUNTER — Other Ambulatory Visit: Payer: Self-pay | Admitting: *Deleted

## 2014-08-16 MED ORDER — SPIRONOLACTONE 25 MG PO TABS: ORAL_TABLET | ORAL | Status: DC

## 2014-08-21 ENCOUNTER — Encounter: Payer: Self-pay | Admitting: Internal Medicine

## 2014-08-27 ENCOUNTER — Encounter (HOSPITAL_COMMUNITY): Payer: Self-pay | Admitting: Emergency Medicine

## 2014-08-27 ENCOUNTER — Ambulatory Visit (HOSPITAL_COMMUNITY): Payer: Medicare Other | Attending: Emergency Medicine

## 2014-08-27 ENCOUNTER — Emergency Department (INDEPENDENT_AMBULATORY_CARE_PROVIDER_SITE_OTHER)
Admission: EM | Admit: 2014-08-27 | Discharge: 2014-08-27 | Disposition: A | Discharge status: Home / Self Care | Payer: Medicare Other | Source: Home / Self Care | Attending: Emergency Medicine | Admitting: Emergency Medicine

## 2014-08-27 DIAGNOSIS — R10819 Abdominal tenderness, unspecified site: Secondary | ICD-10-CM | POA: Diagnosis not present

## 2014-08-27 DIAGNOSIS — K5900 Constipation, unspecified: Secondary | ICD-10-CM

## 2014-08-27 DIAGNOSIS — R110 Nausea: Secondary | ICD-10-CM | POA: Insufficient documentation

## 2014-08-27 DIAGNOSIS — R141 Gas pain: Secondary | ICD-10-CM | POA: Diagnosis not present

## 2014-08-27 DIAGNOSIS — R109 Unspecified abdominal pain: Secondary | ICD-10-CM | POA: Diagnosis not present

## 2014-08-27 DIAGNOSIS — R1013 Epigastric pain: Secondary | ICD-10-CM | POA: Insufficient documentation

## 2014-08-27 LAB — POCT URINALYSIS DIP (DEVICE)
GLUCOSE, UA: 100 mg/dL — AB
Ketones, ur: 15 mg/dL — AB
Leukocytes, UA: NEGATIVE
NITRITE: NEGATIVE
PROTEIN: 30 mg/dL — AB
Specific Gravity, Urine: >=1.03 (ref 1.005–1.030)
Urobilinogen, UA: 2 mg/dL — ABNORMAL HIGH (ref 0.0–1.0)
pH: 5.5 (ref 5.0–8.0)

## 2014-08-27 NOTE — ED Notes (Signed)
Reports epigastric pain that started around 1:30 pm or 2:00 pm today.  Reports nausea, no vomiting.  Pain was particularly significant on right, epigastric area.  Reports discomfort started easing approximately 5:00 p.m. Denies urinary symptoms.  Reports last bm was this am and normal.  Patient reports eating a chicken salad sandwich around 10:00 a.m.Marland Kitchen  Reports she has had similar episodes when eating something greasy.

## 2014-08-27 NOTE — Discharge Instructions (Signed)
Abdominal Pain °Many things can cause abdominal pain. Usually, abdominal pain is not caused by a disease and will improve without treatment. It can often be observed and treated at home. Your health care provider will do a physical exam and possibly order blood tests and X-rays to help determine the seriousness of your pain. However, in many cases, more time must pass before a clear cause of the pain can be found. Before that point, your health care provider may not know if you need more testing or further treatment. °HOME CARE INSTRUCTIONS  °Monitor your abdominal pain for any changes. The following actions may help to alleviate any discomfort you are experiencing: °· Only take over-the-counter or prescription medicines as directed by your health care provider. °· Do not take laxatives unless directed to do so by your health care provider. °· Try a clear liquid diet (broth, tea, or water) as directed by your health care provider. Slowly move to a bland diet as tolerated. °SEEK MEDICAL CARE IF: °· You have unexplained abdominal pain. °· You have abdominal pain associated with nausea or diarrhea. °· You have pain when you urinate or have a bowel movement. °· You experience abdominal pain that wakes you in the night. °· You have abdominal pain that is worsened or improved by eating food. °· You have abdominal pain that is worsened with eating fatty foods. °· You have a fever. °SEEK IMMEDIATE MEDICAL CARE IF:  °· Your pain does not go away within 2 hours. °· You keep throwing up (vomiting). °· Your pain is felt only in portions of the abdomen, such as the right side or the left lower portion of the abdomen. °· You pass bloody or black tarry stools. °MAKE SURE YOU: °· Understand these instructions.   °· Will watch your condition.   °· Will get help right away if you are not doing well or get worse.   °Document Released: 06/09/2005 Document Revised: 09/04/2013 Document Reviewed: 05/09/2013 °[employer]® Patient Information  ©2015 [employer]. This information is not intended to replace advice given to you by your health care provider. Make sure you discuss any questions you have with your health care provider. ° °Constipation °Constipation is when a person has fewer than three bowel movements a week, has difficulty having a bowel movement, or has stools that are dry, hard, or larger than normal. As people grow older, constipation is more common. If you try to fix constipation with medicines that make you have a bowel movement (laxatives), the problem may get worse. Long-term laxative use may cause the muscles of the colon to become weak. A low-fiber diet, not taking in enough fluids, and taking certain medicines may make constipation worse.  °CAUSES  °· Certain medicines, such as antidepressants, pain medicine, iron supplements, antacids, and water pills.   °· Certain diseases, such as diabetes, irritable bowel syndrome (IBS), thyroid disease, or depression.   °· Not drinking enough water.   °· Not eating enough fiber-rich foods.   °· Stress or travel.   °· Lack of physical activity or exercise.   °· Ignoring the urge to have a bowel movement.   °· Using laxatives too much.   °SIGNS AND SYMPTOMS  °· Having fewer than three bowel movements a week.   °· Straining to have a bowel movement.   °· Having stools that are hard, dry, or larger than normal.   °· Feeling full or bloated.   °· Pain in the lower abdomen.   °· Not feeling relief after having a bowel movement.   °DIAGNOSIS  °Your health care provider will take   a medical history and perform a physical exam. Further testing may be done for severe constipation. Some tests may include: °· A barium enema X-ray to examine your rectum, colon, and, sometimes, your small intestine.   °· A sigmoidoscopy to examine your lower colon.   °· A colonoscopy to examine your entire colon. °TREATMENT  °Treatment will depend on the severity of your constipation and what is causing it. Some dietary  treatments include drinking more fluids and eating more fiber-rich foods. Lifestyle treatments may include regular exercise. If these diet and lifestyle recommendations do not help, your health care provider may recommend taking over-the-counter laxative medicines to help you have bowel movements. Prescription medicines may be prescribed if over-the-counter medicines do not work.  °HOME CARE INSTRUCTIONS  °· Eat foods that have a lot of fiber, such as fruits, vegetables, whole grains, and beans. °· Limit foods high in fat and processed sugars, such as french fries, hamburgers, cookies, candies, and soda.   °· A fiber supplement may be added to your diet if you cannot get enough fiber from foods.   °· Drink enough fluids to keep your urine clear or pale yellow.   °· Exercise regularly or as directed by your health care provider.   °· Go to the restroom when you have the urge to go. Do not hold it.   °· Only take over-the-counter or prescription medicines as directed by your health care provider. Do not take other medicines for constipation without talking to your health care provider first.   °SEEK IMMEDIATE MEDICAL CARE IF:  °· You have bright red blood in your stool.   °· Your constipation lasts for more than 4 days or gets worse.   °· You have abdominal or rectal pain.   °· You have thin, pencil-like stools.   °· You have unexplained weight loss. °MAKE SURE YOU:  °· Understand these instructions. °· Will watch your condition. °· Will get help right away if you are not doing well or get worse. °Document Released: 05/28/2004 Document Revised: 09/04/2013 Document Reviewed: 06/11/2013 °[employer]® Patient Information ©2015 [employer]. This information is not intended to replace advice given to you by your health care provider. Make sure you discuss any questions you have with your health care provider. ° °

## 2014-08-27 NOTE — ED Provider Notes (Signed)
CSN: [id number]     Arrival date & time 08/27/14  1622 History   First MD Initiated Contact with Patient 08/27/14 1736     Chief Complaint  Patient presents with  . Abdominal Pain   (Consider location/radiation/quality/duration/timing/severity/associated sxs/prior Treatment) HPI Comments: 68 year old female, morbidly obese developed a burning-type discomfort across the upper abdomen around 2 PM today. She denies a knifelike pain or a stabbing pain or an ache. She only developed relief from pain shortly after arriving to the urgent care. Her discomfort lasted for approximately 3 hours. On occasion she would have mild transient nausea but no vomiting. Denies cramping, diarrhea or constipation. Denies chest pain, heaviness, tightness, fullness, pressure, shortness of breath. Also denies pain in the lower abdomen. She does note that she has had mild right upper quadrant discomfort in the recent past after eating something greasy.   Past Medical History  Diagnosis Date  . Nonischemic cardiomyopathy     Normal coronary arteries, catheterization, September, 2009  . LBBB (left bundle branch block)     old  . ICD (implantable cardiac defibrillator) in place     10/2009; Dr. Lovena Le  . Hypothyroidism   . GERD (gastroesophageal reflux disease)   . Dyslipidemia   . Borderline diabetes   . Mitral regurgitation     Mild, echo, September, 2010  . Overweight(278.02)   . Ejection fraction < 50%     EF 25-30%, September, 2010  . Diabetes mellitus without complication     Borderline  . Systolic CHF, chronic     no recent problems  . Automatic implantable cardioverter-defibrillator in situ     Dr. Beckie Salts follows-next visit- [phone]   Past Surgical History  Procedure Laterality Date  . Tonsillectomy  1952  . Cardiac defibrillator placement    . Heart catherization    . Cardiac catheterization      '09 last  . Eye surgery Left     x 3-"droopy eyelid"  . Colonoscopy N/A 11/29/2013    Procedure:  COLONOSCOPY;  Surgeon: Jerene Bears, MD;  Location: [place];  Service: Gastroenterology;  Laterality: N/A;   Family History  Problem Relation Age of Onset  . Heart attack Mother   . Lung cancer Father   . Pancreatic cancer Father   . Colon cancer Neg Hx   . Diabetes Neg Hx   . Kidney disease Neg Hx   . Liver disease Neg Hx    History  Substance Use Topics  . Smoking status: Never Smoker   . Smokeless tobacco: Never Used  . Alcohol Use: Yes     Comment: 2-4 beers per week   OB History    No data available     Review of Systems  Constitutional: Positive for activity change. Negative for fever and fatigue.  HENT: Negative.   Respiratory: Negative for chest tightness, shortness of breath and wheezing.   Cardiovascular: Negative for chest pain and palpitations.  Gastrointestinal: Positive for abdominal pain. Negative for vomiting, diarrhea, constipation, blood in stool, abdominal distention and rectal pain.  Genitourinary: Negative.   Musculoskeletal: Negative for back pain and neck pain.  Skin: Negative.   Neurological: Negative.     Allergies  Review of patient's allergies indicates no known allergies.  Home Medications   Prior to Admission medications   Medication Sig Start Date End Date Taking? Authorizing Provider  aspirin 81 MG tablet Take 81 mg by mouth daily.     Historical Provider, MD  carvedilol (COREG) 25 MG  tablet TAKE 1 TABLET (25 MG TOTAL) BY MOUTH 2 (TWO) TIMES DAILY WITH A MEAL. 04/03/14   Carlena Bjornstad, MD  Cholecalciferol (VITAMIN D PO) Take 2 tablets by mouth daily.     Historical Provider, MD  citalopram (CELEXA) 40 MG tablet Take 1 tablet (40 mg total) by mouth every evening. 04/30/14   Elby Showers, MD  econazole nitrate 1 % cream Apply topically daily. 04/30/14   Elby Showers, MD  furosemide (LASIX) 40 MG tablet TAKE 1 TABLET BY MOUTH DAILY. 07/15/14   Carlena Bjornstad, MD  glucose blood (ACCU-CHEK AVIVA) test strip Check daily.  For Diabetes  250.00 05/29/14   Elby Showers, MD  ibuprofen (ADVIL,MOTRIN) 200 MG tablet Take 400 mg by mouth every 6 (six) hours as needed for mild pain or moderate pain.     Historical Provider, MD  Lancets MISC Check glucose daily.  Diabetes 250.00 05/29/14   Elby Showers, MD  levothyroxine (SYNTHROID, LEVOTHROID) 125 MCG tablet TAKE 1 TABLET (125 MCG TOTAL) BY MOUTH DAILY. 07/04/14   Elby Showers, MD  metFORMIN (GLUCOPHAGE) 500 MG tablet Take 500 mg by mouth 2 (two) times daily with a meal.    Historical Provider, MD  omeprazole (PRILOSEC) 20 MG capsule TAKE ONE CAPSULE BY MOUTH EVERY DAY 03/07/14   Elby Showers, MD  ramipril (ALTACE) 2.5 MG capsule Take 1 capsule (2.5 mg total) by mouth every evening. 06/24/14   Elby Showers, MD  spironolactone (ALDACTONE) 25 MG tablet TAKE 1 TABLET (25 MG TOTAL) BY MOUTH DAILY. 08/16/14   Carlena Bjornstad, MD   BP 118/68 mmHg  Pulse 92  Temp(Src) 97.5 F (36.4 C) (Oral)  Resp 18  SpO2 92% Physical Exam  Constitutional: She is oriented to person, place, and time. She appears well-developed and well-nourished. No distress.  Neck: Normal range of motion. Neck supple.  Cardiovascular: Normal rate, regular rhythm and normal heart sounds.   Pulmonary/Chest: Effort normal and breath sounds normal. No respiratory distress. She has no wheezes. She has no rales.  Abdominal: Soft. Bowel sounds are normal. She exhibits no mass. There is no rebound and no guarding.  Mild, or distention. Most of the abdomen and particularly in the upper abdomen percusses tympanic. Mild epigastric tenderness. Deep palpation of the epigastrium reduces retrograde pain into the lower third of the sternum. There is mild tenderness to the left upper quadrant and right lower quadrant. No right upper quadrant tenderness. Negative Murphy's.  Musculoskeletal: She exhibits no edema.  Neurological: She is alert and oriented to person, place, and time.  Skin: Skin is warm and dry.  Psychiatric: She has a  normal mood and affect.  Nursing note and vitals reviewed.   ED Course  Procedures (including critical care time) Labs Review Labs Reviewed  POCT URINALYSIS DIP (DEVICE) - Abnormal; Notable for the following:    Glucose, UA 100 (*)    Bilirubin Urine SMALL (*)    Ketones, ur 15 (*)    Hgb urine dipstick TRACE (*)    Protein, ur 30 (*)    Urobilinogen, UA 2.0 (*)    All other components within normal limits   Results for orders placed or performed during the hospital encounter of 08/27/14  POCT urinalysis dip (device)  Result Value Ref Range   Glucose, UA 100 (A) NEGATIVE mg/dL   Bilirubin Urine SMALL (A) NEGATIVE   Ketones, ur 15 (A) NEGATIVE mg/dL   Specific Gravity, Urine >=1.030 1.005 -  1.030   Hgb urine dipstick TRACE (A) NEGATIVE   pH 5.5 5.0 - 8.0   Protein, ur 30 (A) NEGATIVE mg/dL   Urobilinogen, UA 2.0 (H) 0.0 - 1.0 mg/dL   Nitrite NEGATIVE NEGATIVE   Leukocytes, UA NEGATIVE NEGATIVE    Imaging Review No results found.   MDM   1. Abdominal gas pain   2. Abdominal tenderness   3. Abdominal pain   4. Constipation, unspecified constipation type    Miralax, increase fluids and fiber in diet See PCP for f/u. If worse, new sx's, fever, increased pain, vomiting go to the ED.     Janne Napoleon, NP 08/27/14 [phone]

## 2014-08-28 ENCOUNTER — Ambulatory Visit (INDEPENDENT_AMBULATORY_CARE_PROVIDER_SITE_OTHER): Payer: Medicare Other | Admitting: Family Medicine

## 2014-08-28 ENCOUNTER — Ambulatory Visit (INDEPENDENT_AMBULATORY_CARE_PROVIDER_SITE_OTHER): Payer: Medicare Other

## 2014-08-28 VITALS — BP 122/74 | HR 100 | Temp 101.0°F | Resp 17 | Ht 60.0 in | Wt 207.0 lb

## 2014-08-28 DIAGNOSIS — R509 Fever, unspecified: Secondary | ICD-10-CM

## 2014-08-28 DIAGNOSIS — R05 Cough: Secondary | ICD-10-CM

## 2014-08-28 DIAGNOSIS — E119 Type 2 diabetes mellitus without complications: Secondary | ICD-10-CM

## 2014-08-28 DIAGNOSIS — J189 Pneumonia, unspecified organism: Secondary | ICD-10-CM | POA: Diagnosis not present

## 2014-08-28 DIAGNOSIS — J181 Lobar pneumonia, unspecified organism: Secondary | ICD-10-CM

## 2014-08-28 DIAGNOSIS — R1013 Epigastric pain: Secondary | ICD-10-CM

## 2014-08-28 DIAGNOSIS — R059 Cough, unspecified: Secondary | ICD-10-CM

## 2014-08-28 DIAGNOSIS — N3090 Cystitis, unspecified without hematuria: Secondary | ICD-10-CM

## 2014-08-28 DIAGNOSIS — R300 Dysuria: Secondary | ICD-10-CM

## 2014-08-28 LAB — POCT URINALYSIS DIPSTICK
Bilirubin, UA: NEGATIVE
GLUCOSE UA: NEGATIVE
Ketones, UA: 40
NITRITE UA: NEGATIVE
PROTEIN UA: NEGATIVE
Spec Grav, UA: 1.01
Urobilinogen, UA: 2
pH, UA: 6.5

## 2014-08-28 LAB — POCT CBC
GRANULOCYTE PERCENT: 83.4 % — AB (ref 37–80)
HEMATOCRIT: 35.1 % — AB (ref 37.7–47.9)
Hemoglobin: 11.4 g/dL — AB (ref 12.2–16.2)
Lymph, poc: 1.3 (ref 0.6–3.4)
MCH, POC: 32.1 pg — AB (ref 27–31.2)
MCHC: 32.5 g/dL (ref 31.8–35.4)
MCV: 98.7 fL — AB (ref 80–97)
MID (cbc): 0.6 (ref 0–0.9)
MPV: 6.7 fL (ref 0–99.8)
POC Granulocyte: 9.8 — AB (ref 2–6.9)
POC LYMPH PERCENT: 11.4 %L (ref 10–50)
POC MID %: 5.2 %M (ref 0–12)
Platelet Count, POC: 303 10*3/uL (ref 142–424)
RBC: 3.55 M/uL — AB (ref 4.04–5.48)
RDW, POC: 14.2 %
WBC: 11.7 10*3/uL — AB (ref 4.6–10.2)

## 2014-08-28 LAB — POCT UA - MICROSCOPIC ONLY
CASTS, UR, LPF, POC: NEGATIVE
CRYSTALS, UR, HPF, POC: NEGATIVE
MUCUS UA: NEGATIVE
RBC, urine, microscopic: NEGATIVE
YEAST UA: NEGATIVE

## 2014-08-28 LAB — GLUCOSE, POCT (MANUAL RESULT ENTRY): POC Glucose: 123 mg/dl — AB (ref 70–99)

## 2014-08-28 MED ORDER — LEVOFLOXACIN 500 MG PO TABS: 500.0000 mg | ORAL_TABLET | Freq: Every day | ORAL | Status: DC

## 2014-08-28 NOTE — Patient Instructions (Signed)
Drink plenty of fluids and get enough rest  Take Tylenol or ibuprofen if needed for fever  If acutely worse get rechecked at anytime  Take Levaquin 500 mg 1 pill daily beginning today  Decrease the citalopram to one half tablet daily while on the Levaquin  Return to see your primary care as planned next week, or return here if unable to get an appointment. If necessary coming here sooner.

## 2014-08-28 NOTE — Progress Notes (Signed)
Subjective: Patient is here complaining of several things. She was in the emergency room with abdominal discomfort. She was diagnosed as having gas and stool in her abdomen and told to take a laxative which she has not yet done. She has been coughing for a long time for several weeks. She has had a little dysuria. She thinks she was running fevers during the night last night.  Objective: Fevers noted. Her TMs are normal. Throat clear. Neck supple without nodes. Chest clear to auscultation. Heart regular without any murmurs. Has pacemaker. And soft the masses. Bowel sounds present. She has mild generalized tenderness across the upper abdomen especially. Extremities unremarkable.  Assessment: Abdominal pain Cough Fever  Plan: Chest x-ray and labs     Results for orders placed or performed in visit on 08/28/14  POCT UA - Microscopic Only  Result Value Ref Range   WBC, Ur, HPF, POC 6-20    RBC, urine, microscopic NEG    Bacteria, U Microscopic TRACE    Mucus, UA NEG    Epithelial cells, urine per micros 2-6    Crystals, Ur, HPF, POC NEG    Casts, Ur, LPF, POC NEG    Yeast, UA NEG   POCT urinalysis dipstick  Result Value Ref Range   Color, UA YELLOW    Clarity, UA CLOUDY    Glucose, UA NEG    Bilirubin, UA NEG    Ketones, UA 40    Spec Grav, UA 1.010    Blood, UA TRACE    pH, UA 6.5    Protein, UA NEG    Urobilinogen, UA 2.0    Nitrite, UA NEG    Leukocytes, UA moderate (2+)   POCT CBC  Result Value Ref Range   WBC 11.7 (A) 4.6 - 10.2 K/uL   Lymph, poc 1.3 0.6 - 3.4   POC LYMPH PERCENT 11.4 10 - 50 %L   MID (cbc) 0.6 0 - 0.9   POC MID % 5.2 0 - 12 %M   POC Granulocyte 9.8 (A) 2 - 6.9   Granulocyte percent 83.4 (A) 37 - 80 %G   RBC 3.55 (A) 4.04 - 5.48 M/uL   Hemoglobin 11.4 (A) 12.2 - 16.2 g/dL   HCT, POC 35.1 (A) 37.7 - 47.9 %   MCV 98.7 (A) 80 - 97 fL   MCH, POC 32.1 (A) 27 - 31.2 pg   MCHC 32.5 31.8 - 35.4 g/dL   RDW, POC 14.2 %   Platelet Count, POC 303 142 -  424 K/uL   MPV 6.7 0 - 99.8 fL  POCT glucose (manual entry)  Result Value Ref Range   POC Glucose 123 (A) 70 - 99 mg/dl    [facility] reading (PRIMARY) by  Dr. Linna Darner Probable left lower lobe pneumonia, little difficult to tell because a pacemaker obscures part of her lung field. I do not visualize it clearly on lateral. In comparison with an old film from 3 years ago it would appear that there was a little haziness of left lower lung at that time.  Assessment: Urinary tract infection Pneumonia Constipation  Plan: See discharge instructions Treat with Levaquin which would get the urine but will take care of low also if there is a pneumonia there.Marland Kitchen

## 2014-08-29 ENCOUNTER — Telehealth: Payer: Self-pay | Admitting: Internal Medicine

## 2014-08-29 ENCOUNTER — Other Ambulatory Visit: Payer: Self-pay

## 2014-08-29 DIAGNOSIS — N3090 Cystitis, unspecified without hematuria: Secondary | ICD-10-CM

## 2014-08-29 DIAGNOSIS — J189 Pneumonia, unspecified organism: Secondary | ICD-10-CM

## 2014-08-29 DIAGNOSIS — J181 Lobar pneumonia, unspecified organism: Secondary | ICD-10-CM

## 2014-08-29 MED ORDER — LEVOFLOXACIN 500 MG PO TABS: 500.0000 mg | ORAL_TABLET | Freq: Every day | ORAL | Status: DC

## 2014-08-29 NOTE — Telephone Encounter (Signed)
Went to [facility] yesterday and was diagnosed with Pneumonia.  Had chest xray and was given Levaquin 500mg  qd x 7 days.  She was told to follow up with you in 1 week.  That will fall on Christmas Eve.  Do you want her to have Chest X-ray early Christmas Eve a.m. And see her like 10 a.m. That  A.m. In f/u?  Or, do you want to see her Monday?  Please advise.

## 2014-08-29 NOTE — Telephone Encounter (Signed)
I think it takes a minimum of 2 weeks to get over pneumonia. As long as she is doing OK and not SOB or worse we can see her Monday after Christmas. However I would give her an additional 7 days of Levaquin. I think 7 days is not enough. Please order repeat CXR for Monday after Christmas. She will need to have that before coming here.

## 2014-08-29 NOTE — Telephone Encounter (Signed)
Levaquin sent to pharmacy.  Chest xray ordered.

## 2014-08-29 NOTE — Telephone Encounter (Signed)
Spoke with patient; advised to go and pick up additional Rx of Levaquin for another 7 days.  Also advised patient to have f/u Chest X-ray on 12/28 and [facility].  Larene [place] put in order for x-ray.  Patient will arrive CXR around 9:15.  Patient will have f/u appointment with Dr. Renold Genta on 12/28 @ 10:45.  Patient advised to drink plenty of fluids, get plenty of rest and call if any emergent issues prior to her return appointment on 12/28.  Patient verbalized understanding of these orders.

## 2014-09-09 ENCOUNTER — Ambulatory Visit (INDEPENDENT_AMBULATORY_CARE_PROVIDER_SITE_OTHER): Payer: Medicare Other | Admitting: Internal Medicine

## 2014-09-09 ENCOUNTER — Encounter: Payer: Self-pay | Admitting: Internal Medicine

## 2014-09-09 ENCOUNTER — Ambulatory Visit
Admission: RE | Admit: 2014-09-09 | Discharge: 2014-09-09 | Disposition: A | Discharge status: Home / Self Care | Payer: Medicare Other | Source: Ambulatory Visit | Attending: Internal Medicine | Admitting: Internal Medicine

## 2014-09-09 VITALS — BP 120/78 | HR 81 | Temp 97.9°F | Wt 204.0 lb

## 2014-09-09 DIAGNOSIS — J189 Pneumonia, unspecified organism: Secondary | ICD-10-CM

## 2014-09-09 DIAGNOSIS — Z95810 Presence of automatic (implantable) cardiac defibrillator: Secondary | ICD-10-CM | POA: Diagnosis not present

## 2014-09-09 DIAGNOSIS — I517 Cardiomegaly: Secondary | ICD-10-CM | POA: Diagnosis not present

## 2014-09-09 DIAGNOSIS — J181 Lobar pneumonia, unspecified organism: Principal | ICD-10-CM

## 2014-09-09 NOTE — Progress Notes (Signed)
   Subjective:    Patient ID: [name], female    DOB: [birth year], 68 y.o.   MRN: [id number]  HPI  68 year old Female with history of idiopathic cardiomyopathy with ICD seen December 16 at [facility] complaining of cough for 2-3 weeks. Was found to have left lower lobe pneumonia and was treated with Levaquin. She also had an abnormal urinalysis at that time. Culture was not done. No longer asymptomatic with regard to urinary symptoms. Cough has improved. Repeat chest x-ray today shows no evidence of pneumonia. She has 1 more day of Levaquin left. We continued Levaquin for an additional 7 days after her initial prescription. Patient has had 2 Pneumovax immunizations. Has not had Prevnar. Apparently during this episode she call for 2 or 3 weeks. She had discolored sputum and some chills but no documented fever. Patient was reminded not to let respiratory infection strangled too long without consulting physician with her history of heart disease.  Review of Systems     Objective:   Physical Exam  Skin warm and dry. Nodes none. Chest clear to auscultation.      Assessment & Plan:  Left lower lobe pneumonia resolved  Plan: Patient has routine appointment scheduled for mid March. At that time she will receive Prevnar 13.

## 2014-09-09 NOTE — Patient Instructions (Addendum)
Finish antibiotics. Return as needed. Give Prevnar 13 at next visit visit in March.

## 2014-09-17 ENCOUNTER — Other Ambulatory Visit: Payer: Self-pay | Admitting: Internal Medicine

## 2014-09-30 ENCOUNTER — Other Ambulatory Visit: Payer: Self-pay | Admitting: Cardiology

## 2014-10-30 ENCOUNTER — Other Ambulatory Visit: Payer: Self-pay | Admitting: Internal Medicine

## 2014-10-30 ENCOUNTER — Encounter: Payer: Self-pay | Admitting: Internal Medicine

## 2014-10-30 ENCOUNTER — Telehealth: Payer: Self-pay | Admitting: *Deleted

## 2014-10-30 DIAGNOSIS — Z86010 Personal history of colonic polyps: Secondary | ICD-10-CM

## 2014-10-30 NOTE — Telephone Encounter (Signed)
Patient is due for a recall colonoscopy in March for hx of colon polyps 11/29/13. Patient scheduled at [facility] on 01/06/15 @ 11:30 am (hx of ICD placement, cardiomyopathy, CHF, EF of 30-35% as of 06-24-14). Dr Hilarie Fredrickson also request hospital case for availability of APC. Patient has also been scheduled for previsit on 12/18/14 @ 10:30. Patient verbalizes understanding of the above information.

## 2014-11-06 ENCOUNTER — Ambulatory Visit (INDEPENDENT_AMBULATORY_CARE_PROVIDER_SITE_OTHER): Payer: Medicare Other | Admitting: *Deleted

## 2014-11-06 DIAGNOSIS — I428 Other cardiomyopathies: Secondary | ICD-10-CM

## 2014-11-06 DIAGNOSIS — I429 Cardiomyopathy, unspecified: Secondary | ICD-10-CM

## 2014-11-06 LAB — MDC_IDC_ENUM_SESS_TYPE_INCLINIC
Brady Statistic RV Percent Paced: 1 %
HIGH POWER IMPEDANCE MEASURED VALUE: 60 Ohm
HighPow Impedance: 38 Ohm
Implantable Pulse Generator Serial Number: 160860
Lead Channel Impedance Value: 552 Ohm
Lead Channel Pacing Threshold Amplitude: 0.5 V
Lead Channel Pacing Threshold Amplitude: 0.6 V
Lead Channel Pacing Threshold Pulse Width: 0.4 ms
Lead Channel Sensing Intrinsic Amplitude: 17.2 mV
Lead Channel Sensing Intrinsic Amplitude: 3.7 mV
Lead Channel Setting Pacing Amplitude: 2 V
Lead Channel Setting Pacing Pulse Width: 0.4 ms
Lead Channel Setting Sensing Sensitivity: 0.6 mV
MDC IDC MSMT BATTERY REMAINING LONGEVITY: 90 mo
MDC IDC MSMT LEADCHNL RV IMPEDANCE VALUE: 583 Ohm
MDC IDC MSMT LEADCHNL RV PACING THRESHOLD PULSEWIDTH: 0.4 ms
MDC IDC SESS DTM: 20160224050000
MDC IDC SET LEADCHNL RV PACING AMPLITUDE: 2.4 V
MDC IDC STAT BRADY RA PERCENT PACED: 1 %
Zone Setting Detection Interval: 273 ms
Zone Setting Detection Interval: 300 ms
Zone Setting Detection Interval: 353 ms

## 2014-11-06 NOTE — Progress Notes (Signed)
ICD check in clinic. Normal device function. Thresholds and sensing consistent with previous device measurements. Impedance trends stable over time. 3 NSVT episodes. No mode switches. Histogram distribution appropriate for patient and level of activity. No changes made this session. Device programmed at appropriate safety margins. Device programmed to optimize intrinsic conduction. Estimated longevity 7.5 years.  Patient education completed including shock plan. Alert tones/vibration demonstrated for patient.  ROV in May with Dr. Lovena Le.

## 2014-11-20 ENCOUNTER — Encounter: Payer: Self-pay | Admitting: Internal Medicine

## 2014-11-25 ENCOUNTER — Other Ambulatory Visit: Payer: Medicare Other | Admitting: Internal Medicine

## 2014-11-25 DIAGNOSIS — Z79899 Other long term (current) drug therapy: Secondary | ICD-10-CM | POA: Diagnosis not present

## 2014-11-25 DIAGNOSIS — E785 Hyperlipidemia, unspecified: Secondary | ICD-10-CM

## 2014-11-25 DIAGNOSIS — E119 Type 2 diabetes mellitus without complications: Secondary | ICD-10-CM | POA: Diagnosis not present

## 2014-11-25 DIAGNOSIS — E039 Hypothyroidism, unspecified: Secondary | ICD-10-CM

## 2014-11-25 LAB — LIPID PANEL
CHOL/HDL RATIO: 2.3 ratio
Cholesterol: 147 mg/dL (ref 0–200)
HDL: 65 mg/dL (ref >=46)
LDL CALC: 67 mg/dL (ref 0–99)
Triglycerides: 75 mg/dL (ref <150)
VLDL: 15 mg/dL (ref 0–40)

## 2014-11-25 LAB — HEPATIC FUNCTION PANEL
ALBUMIN: 3.8 g/dL (ref 3.5–5.2)
ALT: 14 U/L (ref 0–35)
AST: 13 U/L (ref 0–37)
Alkaline Phosphatase: 78 U/L (ref 39–117)
BILIRUBIN DIRECT: 0.2 mg/dL (ref 0.0–0.3)
Indirect Bilirubin: 0.5 mg/dL (ref 0.2–1.2)
TOTAL PROTEIN: 6.5 g/dL (ref 6.0–8.3)
Total Bilirubin: 0.7 mg/dL (ref 0.2–1.2)

## 2014-11-25 LAB — HEMOGLOBIN A1C
HEMOGLOBIN A1C: 6.1 % — AB (ref <5.7)
MEAN PLASMA GLUCOSE: 128 mg/dL — AB (ref <117)

## 2014-11-25 LAB — TSH: TSH: 0.16 u[IU]/mL — AB (ref 0.350–4.500)

## 2014-11-26 ENCOUNTER — Encounter: Payer: Self-pay | Admitting: Internal Medicine

## 2014-11-26 ENCOUNTER — Ambulatory Visit (INDEPENDENT_AMBULATORY_CARE_PROVIDER_SITE_OTHER): Payer: Medicare Other | Admitting: Internal Medicine

## 2014-11-26 VITALS — BP 122/78 | HR 72 | Temp 97.4°F | Wt 204.0 lb

## 2014-11-26 DIAGNOSIS — E119 Type 2 diabetes mellitus without complications: Secondary | ICD-10-CM

## 2014-11-26 DIAGNOSIS — M8580 Other specified disorders of bone density and structure, unspecified site: Secondary | ICD-10-CM

## 2014-11-26 MED ORDER — LEVOTHYROXINE SODIUM 112 MCG PO TABS
112.0000 ug | ORAL_TABLET | Freq: Every day | ORAL | Status: DC
Start: 2014-11-26 — End: 2015-02-04

## 2014-11-26 NOTE — Patient Instructions (Addendum)
Have mammogram and bone density. Have eye exam in April. Return in 6 months for CPE.  Change levothyroxine to 0.112 mg daily and return in 6 weeks.

## 2014-11-26 NOTE — Progress Notes (Signed)
   Subjective:    Patient ID: [name], female    DOB: Jun 12, 1946, 69 y.o.   MRN: [id number]  HPI  69 year old Female in today for follow-up on essential hypertension, metabolic syndrome, hyperlipidemia, hypothyroidism, type 2 diabetes mellitus, obesity, nonischemic cardiomyopathy. Says depression is stable and she is not depressed at present time. She had a fall recently walking her dog and injured her right lateral trunk. She is a bit sore but recovering. Has an appointment for diabetic eye exam in early April. Has not had mammogram. Have added bone density study to order placed for mammogram. Her TSH is low. It looks like her thyroid replacement dose is too high and we need to reduce that. She is on levothyroxine 0.125 mg daily. Going to reduce it to 0112 mg daily and have her return in 6 weeks. She also had a bout with left lower lobe pneumonia December 2015 and needs Prevnar in the near future. Remains overweight. Doesn't want to go for nutritional counseling.  Hemoglobin A1c is stable. Urine obtained for microalbumin. Lipid panel and liver functions are stable.    Review of Systems     Objective:   Physical Exam  Skin warm and dry. Nodes none. Neck is supple without JVD thyromegaly or carotid bruits. Chest clear. Cardiac exam regular rate and rhythm normal S1 and S2. Extremities without edema      Assessment & Plan:  Essential hypertension-stable  Hyperlipidemia-stable  Controlled type 2 diabetes-stable  History of depression  Metabolic syndrome  Obesity  History of implantable cardioverter defibrillator for nonischemic cardiomyopathy  Hypothyroidism-TSH is low and dose of thyroid replacement medication needs adjustment. Change to levothyroxine 0.112 mg daily and return in 6 weeks for TSH, nurse visit, Prevnar injection. Physical exam due in 6 months. Reminded about mammogram. Bone density order placed. Has diabetic eye exam appointment April 2016. Has colonoscopy  upcoming for follow-up in April as well.

## 2014-11-27 LAB — MICROALBUMIN / CREATININE URINE RATIO: Creatinine, Urine: 29.5 mg/dL

## 2014-11-29 ENCOUNTER — Encounter: Payer: Self-pay | Admitting: Internal Medicine

## 2014-12-16 DIAGNOSIS — H40013 Open angle with borderline findings, low risk, bilateral: Secondary | ICD-10-CM | POA: Diagnosis not present

## 2014-12-16 DIAGNOSIS — H2513 Age-related nuclear cataract, bilateral: Secondary | ICD-10-CM | POA: Diagnosis not present

## 2014-12-16 DIAGNOSIS — E119 Type 2 diabetes mellitus without complications: Secondary | ICD-10-CM | POA: Diagnosis not present

## 2014-12-16 DIAGNOSIS — H25013 Cortical age-related cataract, bilateral: Secondary | ICD-10-CM | POA: Diagnosis not present

## 2014-12-18 ENCOUNTER — Ambulatory Visit (AMBULATORY_SURGERY_CENTER): Payer: Self-pay

## 2014-12-18 VITALS — Ht 59.5 in | Wt 209.4 lb

## 2014-12-18 DIAGNOSIS — Z860100 Personal history of colon polyps, unspecified: Secondary | ICD-10-CM

## 2014-12-18 MED ORDER — MOVIPREP 100 G PO SOLR
1.0000 | Freq: Once | ORAL | Status: DC
Start: 2014-12-18 — End: 2014-12-30

## 2014-12-18 NOTE — Progress Notes (Signed)
No allergies to eggs or soy No diet/weight loss meds No home oxygen No problems with anesthesia in the past other than ether

## 2014-12-19 ENCOUNTER — Telehealth: Payer: Self-pay | Admitting: Internal Medicine

## 2014-12-19 NOTE — Telephone Encounter (Signed)
I sent a note to pharmacy earlier this afternoon. Patient may call if she would like to change preps. However, we do not use golytely.

## 2014-12-23 ENCOUNTER — Encounter (HOSPITAL_COMMUNITY): Payer: Self-pay | Admitting: *Deleted

## 2014-12-26 NOTE — Progress Notes (Signed)
12/25/2014-Cardiac orders Faxed back to nurse signed by Dr. Lovena Le and procedure should not interfer with device function, No Rep. Needed.

## 2014-12-30 ENCOUNTER — Telehealth: Payer: Self-pay | Admitting: Internal Medicine

## 2014-12-30 ENCOUNTER — Encounter: Payer: Self-pay | Admitting: Internal Medicine

## 2014-12-30 ENCOUNTER — Ambulatory Visit (INDEPENDENT_AMBULATORY_CARE_PROVIDER_SITE_OTHER): Payer: Medicare Other | Admitting: Internal Medicine

## 2014-12-30 VITALS — BP 118/76 | HR 77 | Temp 97.6°F | Wt 206.0 lb

## 2014-12-30 DIAGNOSIS — J209 Acute bronchitis, unspecified: Secondary | ICD-10-CM

## 2014-12-30 MED ORDER — LEVOFLOXACIN 500 MG PO TABS: 500.0000 mg | ORAL_TABLET | Freq: Every day | ORAL | Status: DC

## 2014-12-30 MED ORDER — ALBUTEROL SULFATE HFA 108 (90 BASE) MCG/ACT IN AERS: 2.0000 | INHALATION_SPRAY | Freq: Four times a day (QID) | RESPIRATORY_TRACT | Status: DC | PRN

## 2014-12-30 MED ORDER — PREDNISONE 10 MG PO TABS: ORAL_TABLET | ORAL | Status: DC

## 2014-12-30 MED ORDER — HYDROCODONE-HOMATROPINE 5-1.5 MG/5ML PO SYRP: 5.0000 mL | ORAL_SOLUTION | Freq: Three times a day (TID) | ORAL | Status: DC | PRN

## 2014-12-30 NOTE — Patient Instructions (Addendum)
Take Prednisone as directed. Take levaquin 500 mg daily x 10 days. Take Hycodan as directed. Use inhaler 4 times a day. Call if not better in 7-10 days or sooner if worse

## 2014-12-30 NOTE — Telephone Encounter (Signed)
Pt scheduled for colon at [facility] Monday 01/06/15. Pt was seen by PCP today and told she has bronchitis. She was given the following:  Acute bronchitis  Acute bronchospasm  Plan: Hycodan 1 teaspoon by mouth every 6-8 hours when necessary cough. Sterapred DS 10 mg six-day Dosepak #21 take as directed in tapering course. Levaquin 500 milligrams daily for 10 days. Albuterol inhaler 2 sprays by mouth 4 times a day. Call if not better in 7-10 days.  Pt wants to know if she needs to reschedule the procedure to another date and if she can have a sample of suprep because prescribed prep not covered by insurance. Please advise.

## 2014-12-30 NOTE — Progress Notes (Signed)
   Subjective:    Patient ID: [name], female    DOB: Aug 19, 1946, 69 y.o.   MRN: [id number]  HPI  69 year old White female with history of nonischemic cardiomyopathy came down with acute URI symptoms on Wednesday, April 13. Has had cough and wheezing. No fever or shaking chills. Does have a history of asthma and has had bronchitis requiring prednisone in the past. She does have diabetes mellitus.    Review of Systems     Objective:   Physical Exam Skin warm and dry. Pharynx clear. TMs clear. Neck supple without adenopathy. Chest bilateral rhonchi and wheezing. See pulse oximetry.       Assessment & Plan:  Acute bronchitis  Acute bronchospasm  Plan: Hycodan 1 teaspoon by mouth every 6-8 hours when necessary cough. Sterapred DS 10 mg six-day Dosepak #21 take as directed in tapering course. Levaquin 500 milligrams daily for 10 days. Albuterol inhaler 2 sprays by mouth 4 times a day. Call if not better in 7-10 days.

## 2014-12-30 NOTE — Telephone Encounter (Signed)
Patient does need to be adequately treated for acute bronchitis with bronchospasm before colonoscopy which will be a sedated procedure Would agree with completing therapy entirely before proceeding Okay for Suprep sample Okay to reschedule on a hospital outpatient day with monitored anesthesia care

## 2014-12-30 NOTE — Telephone Encounter (Signed)
Pt aware and colon cancelled. Will call pt back to reschedule colon on next hospital outpt day.

## 2015-01-06 ENCOUNTER — Ambulatory Visit (INDEPENDENT_AMBULATORY_CARE_PROVIDER_SITE_OTHER): Payer: Medicare Other

## 2015-01-06 ENCOUNTER — Ambulatory Visit (INDEPENDENT_AMBULATORY_CARE_PROVIDER_SITE_OTHER): Payer: Medicare Other | Admitting: Emergency Medicine

## 2015-01-06 ENCOUNTER — Ambulatory Visit (HOSPITAL_COMMUNITY): Admission: RE | Admit: 2015-01-06 | Payer: Medicare Other | Source: Ambulatory Visit | Admitting: Internal Medicine

## 2015-01-06 VITALS — BP 92/70 | HR 79 | Temp 98.0°F | Resp 18 | Ht 59.5 in | Wt 204.8 lb

## 2015-01-06 DIAGNOSIS — M25511 Pain in right shoulder: Secondary | ICD-10-CM | POA: Diagnosis not present

## 2015-01-06 SURGERY — COLONOSCOPY WITH PROPOFOL
Anesthesia: Monitor Anesthesia Care

## 2015-01-06 MED ORDER — MELOXICAM 7.5 MG PO TABS: 7.5000 mg | ORAL_TABLET | Freq: Every day | ORAL | Status: DC

## 2015-01-06 NOTE — Patient Instructions (Signed)

## 2015-01-06 NOTE — Progress Notes (Signed)
Subjective:  This chart was scribed for Gabrielle Queen, MD by [name], Medical Scribe. This patient was seen in Room 2 and the patient's care was started at 1:43 PM.   Patient ID: [name], female    DOB: 28-Jul-1946, 69 y.o.   MRN: [id number]  Arm Pain    HPI Comments: [name] is a 69 y.o. female who [facility] complaining of constant right shoulder pain radiating to her right elbow that started four days ago.  She went bowling last Wednesday night and is not sure if she  injured her shoulder at that time.  She is unable to lift her arm above her head for an extended amount of time.  She has not taken any medication to treat her symptoms.  She dislocated her right shoulder 15 years ago after falling.  She has not experienced any problems in her right shoulder since then until now.  She lists weakness in her right arm as an associated symptom.  Past Medical History  Diagnosis Date  . Nonischemic cardiomyopathy     Normal coronary arteries, catheterization, September, 2009  . LBBB (left bundle branch block)     old  . ICD (implantable cardiac defibrillator) in place     10/2009; Dr. Lovena Le  . Hypothyroidism   . GERD (gastroesophageal reflux disease)   . Dyslipidemia   . Borderline diabetes   . Mitral regurgitation     Mild, echo, September, 2010  . Overweight(278.02)   . Ejection fraction < 50%     EF 25-30%, September, 2010  . Diabetes mellitus without complication     Borderline  . Systolic CHF, chronic     no recent problems  . Automatic implantable cardioverter-defibrillator in situ     Dr. Beckie Salts follows-next visit- [phone]  . Cataract    Past Surgical History  Procedure Laterality Date  . Tonsillectomy  1952  . Cardiac defibrillator placement    . Heart catherization    . Cardiac catheterization      '09 last  . Eye surgery Left     x 3-"droopy eyelid"  . Colonoscopy N/A 11/29/2013    Procedure: COLONOSCOPY;   Surgeon: Jerene Bears, MD;  Location: [place];  Service: Gastroenterology;  Laterality: N/A;   Family History  Problem Relation Age of Onset  . Heart attack Mother   . Heart disease Mother   . Hypertension Mother   . Lung cancer Father   . Pancreatic cancer Father   . Cancer Father   . Colon cancer Neg Hx   . Diabetes Neg Hx   . Kidney disease Neg Hx   . Liver disease Neg Hx    History   Social History  . Marital Status: Single    Spouse Name: N/A  . Number of Children: N/A  . Years of Education: N/A   Occupational History  . Not on file.   Social History Main Topics  . Smoking status: Never Smoker   . Smokeless tobacco: Never Used  . Alcohol Use: Yes     Comment: 2-4 beers per week  . Drug Use: No  . Sexual Activity: No   Other Topics Concern  . Not on file   Social History Narrative   No Known Allergies  Review of Systems  Musculoskeletal: Positive for arthralgias.  Neurological: Positive for weakness.     Objective:  Physical Exam  Constitutional: She is oriented to person, place,  and time. She appears well-developed and well-nourished.  HENT:  Head: Normocephalic and atraumatic.  Eyes: EOM are normal.  Neck: Normal range of motion.  Cardiovascular: Normal rate.   Pulmonary/Chest: Effort normal.  Musculoskeletal: Normal range of motion.  She is tender over the left anterior shoulder.  There is crepitation with internal and external rotation.  There is pain with abduction of the shoulder.  Her grip strength is decreased; otherwise strength is normal.  Neurological: She is alert and oriented to person, place, and time.  Skin: Skin is warm and dry.  Psychiatric: She has a normal mood and affect. Her behavior is normal.  Nursing note and vitals reviewed.  [facility] (PRIMARY) x-ray report read by Dr. Everlene Farrier: Minimal arthritic change   BP 92/70 mmHg  Pulse 79  Temp(Src) 98 F (36.7 C) (Oral)  Resp 18  Ht 4' 11.5" (1.511 m)  Wt 204 lb 12.8 oz (92.897  kg)  BMI 40.69 kg/m2  SpO2 95% Assessment & Plan:  1. Pain in joint, shoulder region, right Shoulder film shows no definite abnormalities. She is placed in a sling. She will be on Mobic 7.5 daily for 10 days ice to the area [facility]  01/06/2015 2:35 PM

## 2015-01-06 NOTE — Progress Notes (Deleted)
   Subjective:    Patient ID: [name], female    DOB: June 14, 1946, 69 y.o.   MRN: [id number]  HPI    Review of Systems     Objective:   Physical Exam        Assessment & Plan:

## 2015-01-06 NOTE — Progress Notes (Signed)
Subjective:  This chart was scribed for Arlyss Queen, MD by Donato Schultz, Medical Scribe. This patient was seen in Room 2 and the patient's care was started at 1:43 PM.   Patient ID: [name], female    DOB: [birth year], 69 y.o.   MRN: [id number]  HPI HPI Comments: [name] is a 69 y.o. female who presents to the [facility] complaining of constant right shoulder pain radiating to her right elbow that started four days ago.  She went bowling last Wednesday night and is not sure if she  injured her shoulder at that time.  She is unable to lift her arm above her head for an extended amount of time.  She has not taken any medication to treat her symptoms.  She dislocated her right shoulder 15 years ago after falling.  She has not experienced any problems in her right shoulder since then until now.  She lists weakness in her right arm as an associated symptom.  Past Medical History  Diagnosis Date   Nonischemic cardiomyopathy     Normal coronary arteries, catheterization, September, 2009   LBBB (left bundle branch block)     old   ICD (implantable cardiac defibrillator) in place     10/2009; Dr. Lovena Le   Hypothyroidism    GERD (gastroesophageal reflux disease)    Dyslipidemia    Borderline diabetes    Mitral regurgitation     Mild, echo, September, 2010   Overweight(278.02)    Ejection fraction < 50%     EF 25-30%, September, 2010   Diabetes mellitus without complication     Borderline   Systolic CHF, chronic     no recent problems   Automatic implantable cardioverter-defibrillator in situ     Dr. Beckie Salts follows-next visit- 37'15   Cataract    Past Surgical History  Procedure Laterality Date   Tonsillectomy  1952   Cardiac defibrillator placement     Heart catherization     Cardiac catheterization      '09 last   Eye surgery Left     x 3-"droopy eyelid"   Colonoscopy N/A 11/29/2013    Procedure: COLONOSCOPY;  Surgeon: Jerene Bears, MD;  Location: [place];  Service: Gastroenterology;  Laterality: N/A;   Family History  Problem Relation Age of Onset   Heart attack Mother    Heart disease Mother    Hypertension Mother    Lung cancer Father    Pancreatic cancer Father    Cancer Father    Colon cancer Neg Hx    Diabetes Neg Hx    Kidney disease Neg Hx    Liver disease Neg Hx    History   Social History   Marital Status: Single    Spouse Name: N/A   Number of Children: N/A   Years of Education: N/A   Occupational History   Not on file.   Social History Main Topics   Smoking status: Never Smoker    Smokeless tobacco: Never Used   Alcohol Use: Yes     Comment: 2-4 beers per week   Drug Use: No   Sexual Activity: No   Other Topics Concern   Not on file   Social History Narrative   No Known Allergies  Review of Systems  Musculoskeletal: Positive for arthralgias.  Neurological: Positive for weakness.     Objective:  Physical Exam  Constitutional: She is oriented to person, place, and time. She appears  well-developed and well-nourished.  HENT:  Head: Normocephalic and atraumatic.  Eyes: EOM are normal.  Neck: Normal range of motion.  Cardiovascular: Normal rate.   Pulmonary/Chest: Effort normal.  Musculoskeletal: Normal range of motion.  She is tender over the left anterior shoulder.  There is crepitation with internal and external rotation.  There is pain with abduction of the shoulder.  Her grip strength is decreased; otherwise strength is normal.  Neurological: She is alert and oriented to person, place, and time.  Skin: Skin is warm and dry.  Psychiatric: She has a normal mood and affect. Her behavior is normal.  Nursing note and vitals reviewed.  [facility] (PRIMARY) x-ray report read by Dr. Everlene Farrier: Minimal arthritic change   BP 92/70 mmHg   Pulse 79   Temp(Src) 98 F (36.7 C) (Oral)   Resp 18   Ht 4' 11.5" (1.511 m)   Wt 204 lb 12.8 oz (92.897 kg)   BMI 40.69  kg/m2   SpO2 95% Assessment & Plan:  1. Pain in joint, shoulder region, right Shoulder film shows no definite abnormalities. She is placed in a sling. She will be on Mobic 7.5 daily for 10 days ice to the area.     [facility]  01/06/2015 2:33 PM

## 2015-01-09 NOTE — Telephone Encounter (Signed)
Left message for pt to call back  °

## 2015-01-10 ENCOUNTER — Other Ambulatory Visit (INDEPENDENT_AMBULATORY_CARE_PROVIDER_SITE_OTHER): Payer: Medicare Other | Admitting: Internal Medicine

## 2015-01-10 VITALS — BP 100/68 | HR 68 | Temp 97.8°F

## 2015-01-10 DIAGNOSIS — E039 Hypothyroidism, unspecified: Secondary | ICD-10-CM

## 2015-01-10 DIAGNOSIS — Z23 Encounter for immunization: Secondary | ICD-10-CM | POA: Diagnosis not present

## 2015-01-10 LAB — TSH: TSH: 1.04 u[IU]/mL (ref 0.350–4.500)

## 2015-01-10 NOTE — Progress Notes (Signed)
Patient presents today for lab work and a Dietitian vaccine. VS stable . Patient tolerated injection well.

## 2015-01-13 ENCOUNTER — Telehealth: Payer: Self-pay | Admitting: *Deleted

## 2015-01-13 NOTE — Telephone Encounter (Signed)
Left message on patient voice mail with lab results 

## 2015-01-14 DIAGNOSIS — H43813 Vitreous degeneration, bilateral: Secondary | ICD-10-CM | POA: Diagnosis not present

## 2015-01-14 DIAGNOSIS — H04123 Dry eye syndrome of bilateral lacrimal glands: Secondary | ICD-10-CM | POA: Diagnosis not present

## 2015-01-15 ENCOUNTER — Other Ambulatory Visit: Payer: Self-pay

## 2015-01-15 DIAGNOSIS — Z1211 Encounter for screening for malignant neoplasm of colon: Secondary | ICD-10-CM

## 2015-01-15 NOTE — Telephone Encounter (Signed)
Pts colon rescheduled at [facility] 02/25/15@8 :30am with MAC. Pt to arrive there at 7am. Left message for pt to call back.

## 2015-01-21 NOTE — Telephone Encounter (Signed)
Left message for pt to call back  °

## 2015-01-22 NOTE — Telephone Encounter (Signed)
Spoke with pt and she is aware, new instructions printed and pt given a suprep sample.

## 2015-02-04 ENCOUNTER — Ambulatory Visit (INDEPENDENT_AMBULATORY_CARE_PROVIDER_SITE_OTHER): Payer: Medicare Other | Admitting: Internal Medicine

## 2015-02-04 ENCOUNTER — Encounter: Payer: Self-pay | Admitting: Internal Medicine

## 2015-02-04 ENCOUNTER — Other Ambulatory Visit: Payer: Self-pay | Admitting: Internal Medicine

## 2015-02-04 VITALS — BP 123/64 | HR 76 | Ht 59.5 in | Wt 206.2 lb

## 2015-02-04 DIAGNOSIS — I5022 Chronic systolic (congestive) heart failure: Secondary | ICD-10-CM

## 2015-02-04 DIAGNOSIS — I428 Other cardiomyopathies: Secondary | ICD-10-CM

## 2015-02-04 DIAGNOSIS — I429 Cardiomyopathy, unspecified: Secondary | ICD-10-CM

## 2015-02-04 DIAGNOSIS — Z1211 Encounter for screening for malignant neoplasm of colon: Secondary | ICD-10-CM | POA: Diagnosis not present

## 2015-02-04 DIAGNOSIS — I340 Nonrheumatic mitral (valve) insufficiency: Secondary | ICD-10-CM | POA: Diagnosis not present

## 2015-02-04 LAB — CUP PACEART INCLINIC DEVICE CHECK
HIGH POWER IMPEDANCE MEASURED VALUE: 38 Ohm
HighPow Impedance: 59 Ohm
Lead Channel Impedance Value: 624 Ohm
Lead Channel Pacing Threshold Amplitude: 0.6 V
Lead Channel Pacing Threshold Amplitude: 0.7 V
Lead Channel Pacing Threshold Pulse Width: 0.4 ms
Lead Channel Pacing Threshold Pulse Width: 0.4 ms
Lead Channel Sensing Intrinsic Amplitude: 18.1 mV
Lead Channel Sensing Intrinsic Amplitude: 6 mV
Lead Channel Setting Pacing Amplitude: 2.4 V
Lead Channel Setting Pacing Pulse Width: 0.4 ms
Lead Channel Setting Sensing Sensitivity: 0.6 mV
MDC IDC MSMT LEADCHNL RA IMPEDANCE VALUE: 556 Ohm
MDC IDC SESS DTM: 20160524040000
MDC IDC SET LEADCHNL RA PACING AMPLITUDE: 2 V
MDC IDC SET ZONE DETECTION INTERVAL: 273 ms
MDC IDC STAT BRADY RA PERCENT PACED: 0 %
MDC IDC STAT BRADY RV PERCENT PACED: 0 %
Pulse Gen Serial Number: 160860
Zone Setting Detection Interval: 300 ms
Zone Setting Detection Interval: 353 ms

## 2015-02-04 NOTE — Assessment & Plan Note (Signed)
She remains stable from a clinical perspective. No change in meds. Will hold off on repeating echo for now as no progression in over 5 years.

## 2015-02-04 NOTE — Assessment & Plan Note (Signed)
Her [facility] DDD ICD is working normally. Will recheck in several months.

## 2015-02-04 NOTE — Patient Instructions (Signed)
Medication Instructions:  Your physician recommends that you continue on your current medications as directed. Please refer to the Current Medication list given to you today.   Labwork: NONE  Testing/Procedures: NONE  Follow-Up: Your physician recommends that you schedule a follow-up appointment in: 3 months in [place] wants you to follow-up in: 12 months with Dr. Lovena Le. You will receive a reminder letter in the mail two months in advance. If you don't receive a letter, please call our office to schedule the follow-up appointment.   Any Other Special Instructions Will Be Listed Below (If Applicable).

## 2015-02-04 NOTE — Progress Notes (Signed)
HPI [name] returns today for followup. She is a very pleasant 69 year old woman with a nonischemic cardiomyopathy, chronic class II systolic heart failure, obesity, status post ICD implantation. In the interim, she has done well. She denies chest pain, shortness of breath, or syncope. No recent ICD shocks. No peripheral edema.  No Known Allergies   Current Outpatient Prescriptions  Medication Sig Dispense Refill  . albuterol (PROVENTIL HFA;VENTOLIN HFA) 108 (90 BASE) MCG/ACT inhaler Inhale 2 puffs into the lungs every 6 (six) hours as needed for wheezing or shortness of breath. 1 Inhaler 0  . aspirin 81 MG tablet Take 81 mg by mouth every morning.     . carvedilol (COREG) 25 MG tablet TAKE 1 TABLET (25 MG TOTAL) BY MOUTH 2 (TWO) TIMES DAILY WITH A MEAL. 60 tablet 5  . Cholecalciferol (VITAMIN D PO) Take 2 tablets by mouth every morning.     . citalopram (CELEXA) 40 MG tablet Take 1 tablet (40 mg total) by mouth every evening. 90 tablet 3  . furosemide (LASIX) 40 MG tablet TAKE 1 TABLET BY MOUTH DAILY. 30 tablet 11  . glucose blood (ACCU-CHEK AVIVA) test strip Check daily.  For Diabetes 250.00 (Patient taking differently: 1 each by Other route 2 (two) times daily. Check daily.  For Diabetes 250.00) 100 each 12  . HYDROcodone-homatropine (HYCODAN) 5-1.5 MG/5ML syrup Take 5 mLs by mouth every 8 (eight) hours as needed for cough. 120 mL 0  . ibuprofen (ADVIL,MOTRIN) 200 MG tablet Take 400 mg by mouth every 6 (six) hours as needed for mild pain or moderate pain.     . Lancets MISC Check glucose daily.  Diabetes 250.00 100 each 11  . levothyroxine (SYNTHROID, LEVOTHROID) 112 MCG tablet TAKE 1 TABLET (112 MCG TOTAL) BY MOUTH DAILY BEFORE BREAKFAST. 30 tablet 1  . metFORMIN (GLUCOPHAGE) 500 MG tablet Take 500 mg by mouth 2 (two) times daily with a meal.    . Multiple Vitamin (MULTIVITAMIN WITH MINERALS) TABS tablet Take 1 tablet by mouth every morning. chewable    . omeprazole (PRILOSEC) 20 MG  capsule TAKE ONE CAPSULE BY MOUTH EVERY DAY 90 capsule 3  . ramipril (ALTACE) 2.5 MG capsule Take 1 capsule (2.5 mg total) by mouth every evening. (Patient taking differently: Take 2.5 mg by mouth daily at 3 pm. ) 90 capsule 3  . spironolactone (ALDACTONE) 25 MG tablet TAKE 1 TABLET (25 MG TOTAL) BY MOUTH DAILY. 30 tablet 9   No current facility-administered medications for this visit.     Past Medical History  Diagnosis Date  . Nonischemic cardiomyopathy     Normal coronary arteries, catheterization, September, 2009  . LBBB (left bundle branch block)     old  . ICD (implantable cardiac defibrillator) in place     10/2009; Dr. Lovena Le  . Hypothyroidism   . GERD (gastroesophageal reflux disease)   . Dyslipidemia   . Borderline diabetes   . Mitral regurgitation     Mild, echo, September, 2010  . Overweight(278.02)   . Ejection fraction < 50%     EF 25-30%, September, 2010  . Diabetes mellitus without complication     Borderline  . Systolic CHF, chronic     no recent problems  . Automatic implantable cardioverter-defibrillator in situ     Dr. Beckie Salts follows-next visit- [phone]  . Cataract     ROS:   All systems reviewed and negative except as noted in the HPI.   Past Surgical History  Procedure Laterality  Date  . Tonsillectomy  1952  . Cardiac defibrillator placement    . Heart catherization    . Cardiac catheterization      '09 last  . Eye surgery Left     x 3-"droopy eyelid"  . Colonoscopy N/A 11/29/2013    Procedure: COLONOSCOPY;  Surgeon: Jerene Bears, MD;  Location: [place] [facility];  Service: Gastroenterology;  Laterality: N/A;     Family History  Problem Relation Age of Onset  . Heart attack Mother   . Heart disease Mother   . Hypertension Mother   . Lung cancer Father   . Pancreatic cancer Father   . Cancer Father   . Colon cancer Neg Hx   . Diabetes Neg Hx   . Kidney disease Neg Hx   . Liver disease Neg Hx      History   Social History  . Marital  Status: Single    Spouse Name: N/A  . Number of Children: N/A  . Years of Education: N/A   Occupational History  . Not on file.   Social History Main Topics  . Smoking status: Never Smoker   . Smokeless tobacco: Never Used  . Alcohol Use: Yes     Comment: 2-4 beers per week  . Drug Use: No  . Sexual Activity: No   Other Topics Concern  . Not on file   Social History Narrative     BP 123/64 mmHg  Pulse 76  Ht 4' 11.5" (1.511 m)  Wt 206 lb 3.2 oz (93.532 kg)  BMI 40.97 kg/m2  Physical Exam:  Well appearing middle-age obese woman, NAD HEENT: Unremarkable Neck: 7 cm jugulovenous distention, no thyromegaly Back:  No CVA tenderness Lungs:  Clear with no wheezes, rales, or rhonchi. HEART:  Regular rate rhythm, no murmurs, no rubs, no clicks Abd:  soft, positive bowel sounds, no organomegally, no rebound, no guarding Ext:  2 plus pulses, no edema, no cyanosis, no clubbing Skin:  No rashes no nodules Neuro:  CN II through XII intact, motor grossly intact  DEVICE  Normal device function.  See PaceArt for details.   Assess/Plan:

## 2015-02-04 NOTE — Assessment & Plan Note (Signed)
Her symptoms are class 2. She will continue her current meds. 

## 2015-02-13 ENCOUNTER — Encounter (HOSPITAL_COMMUNITY): Payer: Self-pay | Admitting: *Deleted

## 2015-02-14 ENCOUNTER — Encounter: Payer: Self-pay | Admitting: Internal Medicine

## 2015-02-25 ENCOUNTER — Ambulatory Visit (HOSPITAL_COMMUNITY): Payer: Medicare Other | Admitting: Certified Registered Nurse Anesthetist

## 2015-02-25 ENCOUNTER — Ambulatory Visit (HOSPITAL_COMMUNITY)
Admission: RE | Admit: 2015-02-25 | Discharge: 2015-02-25 | Disposition: A | Discharge status: Home / Self Care | Payer: Medicare Other | Source: Ambulatory Visit | Attending: Internal Medicine | Admitting: Internal Medicine

## 2015-02-25 ENCOUNTER — Encounter (HOSPITAL_COMMUNITY)
Admission: RE | Disposition: A | Discharge status: Home / Self Care | Payer: Self-pay | Source: Ambulatory Visit | Attending: Internal Medicine

## 2015-02-25 ENCOUNTER — Encounter (HOSPITAL_COMMUNITY): Payer: Self-pay

## 2015-02-25 DIAGNOSIS — E785 Hyperlipidemia, unspecified: Secondary | ICD-10-CM | POA: Diagnosis not present

## 2015-02-25 DIAGNOSIS — Z860100 Personal history of colon polyps, unspecified: Secondary | ICD-10-CM | POA: Insufficient documentation

## 2015-02-25 DIAGNOSIS — H269 Unspecified cataract: Secondary | ICD-10-CM | POA: Diagnosis not present

## 2015-02-25 DIAGNOSIS — Z1211 Encounter for screening for malignant neoplasm of colon: Secondary | ICD-10-CM | POA: Diagnosis not present

## 2015-02-25 DIAGNOSIS — K5790 Diverticulosis of intestine, part unspecified, without perforation or abscess without bleeding: Secondary | ICD-10-CM | POA: Diagnosis not present

## 2015-02-25 DIAGNOSIS — K219 Gastro-esophageal reflux disease without esophagitis: Secondary | ICD-10-CM | POA: Insufficient documentation

## 2015-02-25 DIAGNOSIS — E039 Hypothyroidism, unspecified: Secondary | ICD-10-CM | POA: Diagnosis not present

## 2015-02-25 DIAGNOSIS — I5022 Chronic systolic (congestive) heart failure: Secondary | ICD-10-CM | POA: Diagnosis not present

## 2015-02-25 DIAGNOSIS — I340 Nonrheumatic mitral (valve) insufficiency: Secondary | ICD-10-CM | POA: Diagnosis not present

## 2015-02-25 DIAGNOSIS — Z6841 Body Mass Index (BMI) 40.0 and over, adult: Secondary | ICD-10-CM | POA: Insufficient documentation

## 2015-02-25 DIAGNOSIS — E119 Type 2 diabetes mellitus without complications: Secondary | ICD-10-CM | POA: Insufficient documentation

## 2015-02-25 DIAGNOSIS — Z95810 Presence of automatic (implantable) cardiac defibrillator: Secondary | ICD-10-CM | POA: Insufficient documentation

## 2015-02-25 DIAGNOSIS — I429 Cardiomyopathy, unspecified: Secondary | ICD-10-CM | POA: Diagnosis not present

## 2015-02-25 DIAGNOSIS — D124 Benign neoplasm of descending colon: Secondary | ICD-10-CM | POA: Insufficient documentation

## 2015-02-25 DIAGNOSIS — I447 Left bundle-branch block, unspecified: Secondary | ICD-10-CM | POA: Insufficient documentation

## 2015-02-25 HISTORY — PX: COLONOSCOPY: SHX5424

## 2015-02-25 LAB — GLUCOSE, CAPILLARY: GLUCOSE-CAPILLARY: 91 mg/dL (ref 65–99)

## 2015-02-25 SURGERY — COLONOSCOPY
Anesthesia: Monitor Anesthesia Care

## 2015-02-25 MED ORDER — PROPOFOL INFUSION 10 MG/ML OPTIME
INTRAVENOUS | Status: DC | PRN
  Administered 2015-02-25: 120 ug/kg/min via INTRAVENOUS

## 2015-02-25 MED ORDER — ONDANSETRON HCL 4 MG/2ML IJ SOLN
INTRAMUSCULAR | Status: AC
  Filled 2015-02-25: qty 2

## 2015-02-25 MED ORDER — LIDOCAINE HCL (CARDIAC) 20 MG/ML IV SOLN
INTRAVENOUS | Status: AC
Start: 2015-02-25 — End: 2015-02-25
  Filled 2015-02-25: qty 5

## 2015-02-25 MED ORDER — PROPOFOL 10 MG/ML IV BOLUS
INTRAVENOUS | Status: AC
  Filled 2015-02-25: qty 20

## 2015-02-25 MED ORDER — LACTATED RINGERS IV SOLN
INTRAVENOUS | Status: DC
  Administered 2015-02-25: 1000 mL via INTRAVENOUS

## 2015-02-25 MED ORDER — SODIUM CHLORIDE 0.9 % IV SOLN: INTRAVENOUS | Status: DC

## 2015-02-25 MED ORDER — PROPOFOL 10 MG/ML IV BOLUS
INTRAVENOUS | Status: DC | PRN
  Administered 2015-02-25 (×2): 10 mg via INTRAVENOUS

## 2015-02-25 MED ORDER — ONDANSETRON HCL 4 MG/2ML IJ SOLN
INTRAMUSCULAR | Status: DC | PRN
  Administered 2015-02-25: 4 mg via INTRAVENOUS

## 2015-02-25 MED ORDER — LIDOCAINE HCL (CARDIAC) 20 MG/ML IV SOLN
INTRAVENOUS | Status: DC | PRN
  Administered 2015-02-25: 100 mg via INTRAVENOUS

## 2015-02-25 NOTE — Transfer of Care (Signed)
Immediate Anesthesia Transfer of Care Note  Patient: [name]  Procedure(s) Performed: Procedure(s): COLONOSCOPY (N/A)  Patient Location: endoscopy   Anesthesia Type:MAC  Level of Consciousness:  awake, patient cooperative and responds to stimulation  Airway & Oxygen Therapy:Patient Spontanous Breathing and Patient connected to face mask oxgen  Post-op Assessment:  Report given to endo RN and Post -op Vital signs reviewed and stable  Post vital signs:  Reviewed and stable  Last Vitals:  Filed Vitals:   02/25/15 0812  BP: 143/53  Pulse: 72  Temp: 36.9 C  Resp: 21    Complications: No apparent anesthesia complications

## 2015-02-25 NOTE — Anesthesia Preprocedure Evaluation (Addendum)
Anesthesia Evaluation  Patient identified by MRN, date of birth, ID band Patient awake    Reviewed: Allergy & Precautions, H&P , NPO status , Patient's Chart, lab work & pertinent test results, reviewed documented beta blocker date and time   Airway Mallampati: II  TM Distance: >3 FB Neck ROM: Full    Dental no notable dental hx. (+) Teeth Intact, Dental Advisory Given   Pulmonary neg pulmonary ROS,  breath sounds clear to auscultation  Pulmonary exam normal       Cardiovascular Exercise Tolerance: Good +CHF Normal cardiovascular exam+ dysrhythmias + Cardiac Defibrillator Rhythm:Regular Rate:Normal  EF 25%. LBBB   Neuro/Psych negative neurological ROS  negative psych ROS   GI/Hepatic negative GI ROS, Neg liver ROS, GERD-  ,  Endo/Other  diabetes, Well Controlled, Type 2, Oral Hypoglycemic AgentsHypothyroidism Morbid obesity  Renal/GU negative Renal ROS  negative genitourinary   Musculoskeletal   Abdominal (+)  Abdomen: soft. Bowel sounds: normal.  Peds  Hematology negative hematology ROS (+)   Anesthesia Other Findings   Reproductive/Obstetrics negative OB ROS                           Anesthesia Physical Anesthesia Plan  ASA: IV  Anesthesia Plan: MAC   Post-op Pain Management:    Induction:   Airway Management Planned:   Additional Equipment:   Intra-op Plan:   Post-operative Plan:   Informed Consent: I have reviewed the patients History and Physical, chart, labs and discussed the procedure including the risks, benefits and alternatives for the proposed anesthesia with the patient or authorized representative who has indicated his/her understanding and acceptance.   Dental Advisory Given  Plan Discussed with: CRNA and Surgeon  Anesthesia Plan Comments:         Anesthesia Quick Evaluation

## 2015-02-25 NOTE — Anesthesia Postprocedure Evaluation (Signed)
  Anesthesia Post-op Note  Patient: [name]  Procedure(s) Performed: Procedure(s) (LRB): COLONOSCOPY (N/A)  Patient Location: PACU  Anesthesia Type: MAC  Level of Consciousness: awake and alert   Airway and Oxygen Therapy: Patient Spontanous Breathing  Post-op Pain: mild  Post-op Assessment: Post-op Vital signs reviewed, Patient's Cardiovascular Status Stable, Respiratory Function Stable, Patent Airway and No signs of Nausea or vomiting  Last Vitals:  Filed Vitals:   02/25/15 0940  BP:   Pulse: 65  Temp:   Resp: 17    Post-op Vital Signs: stable   Complications: No apparent anesthesia complications

## 2015-02-25 NOTE — H&P (Signed)
HPI: [name] is a 69 year old female with a past medical history of adenomatous colon polyps, diabetes, CHF status post defibrillator placement, hypothyroidism, dyslipidemia who presents for surveillance colonoscopy. She previously had a positive Cologuard and came for colonoscopy on 11/29/2013. This revealed a 15 mm sessile serrated adenoma in the ascending colon. Polyp was removed in piecemeal fashion and tattoo was placed beside polypectomy base. She presents for surveillance colonoscopy to ensure complete resection. Denies complaint today including abdominal pain. No recent diarrhea or constipation. No blood in her stool or melena. No recent fevers or chills. Denies dyspnea or chest pain  Past Medical History  Diagnosis Date  . Nonischemic cardiomyopathy     Normal coronary arteries, catheterization, September, 2009  . LBBB (left bundle branch block)     old  . ICD (implantable cardiac defibrillator) in place     10/2009; Dr. Lovena Le  . Hypothyroidism   . GERD (gastroesophageal reflux disease)   . Dyslipidemia   . Borderline diabetes   . Mitral regurgitation     Mild, echo, September, 2010  . Overweight(278.02)   . Ejection fraction < 50%     EF 25-30%, September, 2010  . Diabetes mellitus without complication     Borderline  . Systolic CHF, chronic     no recent problems  . Cataract   . Automatic implantable cardioverter-defibrillator in situ     Dr. Beckie Salts follows-next visit- [phone]    Past Surgical History  Procedure Laterality Date  . Tonsillectomy  1952  . Cardiac defibrillator placement    . Heart catherization    . Cardiac catheterization      '09 last  . Eye surgery Left     x 3-"droopy eyelid"  . Colonoscopy N/A 11/29/2013    Procedure: COLONOSCOPY;  Surgeon: Jerene Bears, MD;  Location: [place];  Service: Gastroenterology;  Laterality: N/A;     (Not in an outpatient encounter)  No Known Allergies  Family History  Problem Relation Age of Onset   . Heart attack Mother   . Heart disease Mother   . Hypertension Mother   . Lung cancer Father   . Pancreatic cancer Father   . Cancer Father   . Colon cancer Neg Hx   . Diabetes Neg Hx   . Kidney disease Neg Hx   . Liver disease Neg Hx     History  Substance Use Topics  . Smoking status: Never Smoker   . Smokeless tobacco: Never Used  . Alcohol Use: Yes     Comment: 2-4 beers per week    ROS: As per history of present illness, otherwise negative  BP 143/53 mmHg  Pulse 72  Temp(Src) 98.4 F (36.9 C) (Oral)  Resp 21  Ht 4' 11.75" (1.518 m)  Wt 204 lb (92.534 kg)  BMI 40.16 kg/m2  SpO2 93% Constitutional: Well-developed and well-nourished. No distress. HEENT: Normocephalic and atraumatic.  Conjunctivae are normal.  No scleral icterus. Neck: Neck supple. Trachea midline. Cardiovascular: Normal rate, regular rhythm and intact distal pulses. Pulmonary/chest: Effort normal and breath sounds normal. No wheezing, rales or rhonchi. Abdominal: Soft, nontender, nondistended. Bowel sounds active throughout. Extremities: no clubbing, cyanosis, or edema Neurological: Alert and oriented to person place and time. Psychiatric: Normal mood and affect. Behavior is normal.  RELEVANT LABS AND IMAGING: CBC    Component Value Date/Time   WBC 11.7* 08/28/2014 1225   WBC 4.1 04/29/2014 0912   RBC 3.55* 08/28/2014 1225   RBC 3.73* 04/29/2014 0912  HGB 11.4* 08/28/2014 1225   HGB 12.4 04/29/2014 0912   HCT 35.1* 08/28/2014 1225   HCT 35.7* 04/29/2014 0912   PLT 318 04/29/2014 0912   MCV 98.7* 08/28/2014 1225   MCV 95.7 04/29/2014 0912   MCH 32.1* 08/28/2014 1225   MCH 33.2 04/29/2014 0912   MCHC 32.5 08/28/2014 1225   MCHC 34.7 04/29/2014 0912   RDW 15.7* 04/29/2014 0912   LYMPHSABS 1.9 04/29/2014 0912   MONOABS 0.4 04/29/2014 0912   EOSABS 0.1 04/29/2014 0912   BASOSABS 0.0 04/29/2014 0912    CMP     Component Value Date/Time   NA 138 04/29/2014 0912   K 4.2 04/29/2014  0912   CL 98 04/29/2014 0912   CO2 33* 04/29/2014 0912   GLUCOSE 113* 04/29/2014 0912   BUN 20 04/29/2014 0912   CREATININE 0.75 04/29/2014 0912   CREATININE 0.7 10/27/2009 1421   CALCIUM 9.5 04/29/2014 0912   PROT 6.5 11/25/2014 0911   ALBUMIN 3.8 11/25/2014 0911   AST 13 11/25/2014 0911   ALT 14 11/25/2014 0911   ALKPHOS 78 11/25/2014 0911   BILITOT 0.7 11/25/2014 0911   GFRNONAA 89.71 10/27/2009 1421   GFRAA  12/02/2008 1635    >60        The eGFR has been calculated using the MDRD equation. This calculation has not been validated in all clinical situations. eGFR's persistently <60 mL/min signify possible Chronic Kidney Disease.    ASSESSMENT/PLAN: 69 year old female with a past medical history of adenomatous colon polyps, diabetes, CHF status post defibrillator placement, hypothyroidism, dyslipidemia who presents for surveillance colonoscopy.  1. Hx of SSA removed piecemeal 1 yr ago -- for repeat colonoscopy today. The nature of the procedure, as well as the risks, benefits, and alternatives were carefully and thoroughly reviewed with the patient. Ample time for discussion and questions allowed. The patient understood, was satisfied, and agreed to proceed.

## 2015-02-25 NOTE — Discharge Instructions (Addendum)
Colonoscopy, Care After Refer to this sheet in the next few weeks. These instructions provide you with information on caring for yourself after your procedure. Your health care provider may also give you more specific instructions. Your treatment has been planned according to current medical practices, but problems sometimes occur. Call your health care provider if you have any problems or questions after your procedure.  WHAT TO EXPECT AFTER THE PROCEDURE  After your procedure, it is typical to have the following: 1. A small amount of blood in your stool. 2. Moderate amounts of gas and mild abdominal cramping or bloating.   HOME CARE INSTRUCTIONS  Do not drive, operate machinery, or sign important documents for 24 hours.  You may shower and resume your regular physical activities, but move at a slower pace for the first 24 hours.  Take frequent rest periods for the first 24 hours.  Walk around or put a warm pack on your abdomen to help reduce abdominal cramping and bloating.  Drink enough fluids to keep your urine clear or pale yellow.  You may resume your normal diet as instructed by your health care provider. Avoid heavy or fried foods that are hard to digest.  Avoid drinking alcohol for 24 hours or as instructed by your health care provider.  Only take over-the-counter or prescription medicines as directed by your health care provider.  If a tissue sample (biopsy) was taken during your procedure:  Do not take aspirin or blood thinners for 7 days, or as instructed by your health care provider.  Do not drink alcohol for 7 days, or as instructed by your health care provider.  Eat soft foods for the first 24 hours.   SEEK MEDICAL CARE IF: You have persistent spotting of blood in your stool 2-3 days after the procedure.   SEEK IMMEDIATE MEDICAL CARE IF: 1. You have more than a small spotting of blood in your stool. 2. You pass large blood clots in your stool. 3. Your abdomen  is swollen (distended). 4. You have nausea or vomiting. 5. You have a fever. 6. You have increasing abdominal pain that is not relieved with medicine.  Colon Polyps Polyps are lumps of extra tissue growing inside the body. Polyps can grow in the large intestine (colon). Most colon polyps are noncancerous (benign). However, some colon polyps can become cancerous over time. Polyps that are larger than a pea may be harmful. To be safe, caregivers remove and test all polyps. CAUSES  Polyps form when mutations in the genes cause your cells to grow and divide even though no more tissue is needed. RISK FACTORS There are a number of risk factors that can increase your chances of getting colon polyps. They include: 3. Being older than 50 years. 4. Family history of colon polyps or colon cancer. 5. Long-term colon diseases, such as colitis or Crohn disease. 6. Being overweight. 7. Smoking. 8. Being inactive. 9. Drinking too much alcohol. SYMPTOMS  Most small polyps do not cause symptoms. If symptoms are present, they may include:  Blood in the stool. The stool may look dark red or black.  Constipation or diarrhea that lasts longer than 1 week. DIAGNOSIS People often do not know they have polyps until their caregiver finds them during a regular checkup. Your caregiver can use 4 tests to check for polyps: 7. Digital rectal exam. The caregiver wears gloves and feels inside the rectum. This test would find polyps only in the rectum. 8. Barium enema. The caregiver puts  a liquid called barium into your rectum before taking X-rays of your colon. Barium makes your colon look white. Polyps are dark, so they are easy to see in the X-ray pictures. 9. Sigmoidoscopy. A thin, flexible tube (sigmoidoscope) is placed into your rectum. The sigmoidoscope has a light and tiny camera in it. The caregiver uses the sigmoidoscope to look at the last third of your colon. 10. Colonoscopy. This test is like sigmoidoscopy,  but the caregiver looks at the entire colon. This is the most common method for finding and removing polyps. TREATMENT  Any polyps will be removed during a sigmoidoscopy or colonoscopy. The polyps are then tested for cancer. PREVENTION  To help lower your risk of getting more colon polyps:  Eat plenty of fruits and vegetables. Avoid eating fatty foods.  Do not smoke.  Avoid drinking alcohol.  Exercise every day.  Lose weight if recommended by your caregiver.  Eat plenty of calcium and folate. Foods that are rich in calcium include milk, cheese, and broccoli. Foods that are rich in folate include chickpeas, kidney beans, and spinach.  YOU HAD AN ENDOSCOPIC PROCEDURE TODAY: Refer to the procedure report that was given to you for any specific questions about what was found during the examination.  If the procedure report does not answer your questions, please call your gastroenterologist to clarify.  YOU SHOULD EXPECT: Some feelings of bloating in the abdomen. Passage of more gas than usual.  Walking can help get rid of the air that was put into your GI tract during the procedure and reduce the bloating. If you had a lower endoscopy (such as a colonoscopy or flexible sigmoidoscopy) you may notice spotting of blood in your stool or on the toilet paper.   DIET: Your first meal following the procedure should be a light meal and then it is ok to progress to your normal diet.  A half-sandwich or bowl of soup is an example of a good first meal.  Heavy or fried foods are harder to digest and may make you feel nasueas or bloated.  Drink plenty of fluids but you should avoid alcoholic beverages for 24 hours.  ACTIVITY: Your care partner should take you home directly after the procedure.  You should plan to take it easy, moving slowly for the rest of the day.  You can resume normal activity the day after the procedure however you should NOT DRIVE or use heavy machinery for 24 hours (because of the  sedation medicines used during the test).    SYMPTOMS TO REPORT IMMEDIATELY  A gastroenterologist can be reached at any hour.  Please call your doctor's office for any of the following symptoms:   Following lower endoscopy (colonoscopy, flexible sigmoidoscopy)  Excessive amounts of blood in the stool  Significant tenderness, worsening of abdominal pains  Swelling of the abdomen that is new, acute  Fever of 100 or higher  Following upper endoscopy (EGD, EUS, ERCP)  Vomiting of blood or coffee ground material  New, significant abdominal pain  New, significant chest pain or pain under the shoulder blades  Painful or persistently difficult swallowing  New shortness of breath  Black, tarry-looking stools  FOLLOW UP: If any biopsies were taken you will be contacted by phone or by letter within the next 1-3 weeks.  Call your gastroenterologist if you have not heard about the biopsies in 3 weeks.  Please also call your gastroenterologist's office with any specific questions about appointments or follow up tests.

## 2015-02-25 NOTE — Op Note (Signed)
[facility] [place], [address]   COLONOSCOPY PROCEDURE REPORT  PATIENT: Gabrielle, [name]  MR#: [id number] BIRTHDATE: 01-Jul-1946 , 68  yrs. old GENDER: female ENDOSCOPIST: Jerene Bears, MD PROCEDURE DATE:  02/25/2015 PROCEDURE:   Colonoscopy, surveillance and Colonoscopy with snare polypectomy First Screening Colonoscopy - Avg.  risk and is 50 yrs.  old or older - No.  Prior Negative Screening - Now for repeat screening. N/A  History of Adenoma - Now for follow-up colonoscopy & has been > or = to 3 yrs.  No.  It has been less than 3 yrs since last colonoscopy.  Medical reason.  Polyps removed today? 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 MEDICATIONS: Monitored anesthesia care and Per Anesthesia  DESCRIPTION OF PROCEDURE:   After the risks benefits and alternatives of the procedure were thoroughly explained, informed consent was obtained.  The digital rectal exam revealed no rectal mass.   The Pentax Ped Colon [id number]  endoscope was introduced through the anus and advanced to the terminal ileum which was intubated for a short distance. No adverse events experienced. The quality of the prep was good.  (Suprep was used)  The instrument was then slowly withdrawn as the colon was fully examined. Estimated blood loss is zero unless otherwise noted in this procedure report.   COLON FINDINGS: The examined terminal ileum appeared to be normal. Previously placed mucosal tattoo in the ascending colon.  After careful inspection, no evidence of residual sessile serrated adenoma at this location.   A sessile polyp measuring 5 mm in size was found in the descending colon.  A polypectomy was performed with a cold snare.  The resection was complete, the polyp tissue was completely retrieved and sent to histology.   There  was moderate diverticulosis noted in the descending colon and sigmoid colon.  Retroflexed views revealed no abnormalities. The time to cecum = 1.2 Withdrawal time = 11.8   The scope was withdrawn and the procedure completed.  COMPLICATIONS: There were no immediate complications.    ENDOSCOPIC IMPRESSION: 1.   The examined terminal ileum appeared to be normal 2.   Previously placed mucosal tattoo in the ascending colon.  No evidence of residual sessile serrated adenoma at this location 3.   Sessile polyp was found in the descending colon; polypectomy was performed with a cold snare 4.   Moderate diverticulosis was noted in the descending colon and sigmoid colon  RECOMMENDATIONS: 1.  Await pathology results 2.  High fiber diet 3.  Timing of repeat colonoscopy will be determined by pathology findings. 4.  You will receive a letter within 1-2 weeks with the results of your biopsy as well as final recommendations.  Please call my office if you have not received a letter after 3 weeks.  eSigned:  Jerene Bears, MD 02/25/2015 9:26 AM   cc: Emeline General, MD and The Patient   PATIENT NAME:  Gabrielle, [name] MR#: [id number]

## 2015-02-26 ENCOUNTER — Encounter (HOSPITAL_COMMUNITY): Payer: Self-pay | Admitting: Internal Medicine

## 2015-02-26 ENCOUNTER — Encounter: Payer: Self-pay | Admitting: Internal Medicine

## 2015-03-08 ENCOUNTER — Other Ambulatory Visit: Payer: Self-pay | Admitting: Internal Medicine

## 2015-03-28 ENCOUNTER — Encounter: Payer: Self-pay | Admitting: Internal Medicine

## 2015-03-28 ENCOUNTER — Ambulatory Visit (INDEPENDENT_AMBULATORY_CARE_PROVIDER_SITE_OTHER): Payer: Medicare Other | Admitting: Internal Medicine

## 2015-03-28 VITALS — BP 122/78 | HR 78 | Temp 98.2°F | Wt 202.0 lb

## 2015-03-28 DIAGNOSIS — R0902 Hypoxemia: Secondary | ICD-10-CM

## 2015-03-28 DIAGNOSIS — J209 Acute bronchitis, unspecified: Secondary | ICD-10-CM

## 2015-03-28 DIAGNOSIS — I428 Other cardiomyopathies: Secondary | ICD-10-CM

## 2015-03-28 DIAGNOSIS — I429 Cardiomyopathy, unspecified: Secondary | ICD-10-CM

## 2015-03-28 MED ORDER — LEVOFLOXACIN 500 MG PO TABS: 500.0000 mg | ORAL_TABLET | Freq: Every day | ORAL | Status: DC

## 2015-03-28 MED ORDER — PREDNISONE 10 MG PO TABS: ORAL_TABLET | ORAL | Status: DC

## 2015-03-28 MED ORDER — ALBUTEROL SULFATE HFA 108 (90 BASE) MCG/ACT IN AERS: 2.0000 | INHALATION_SPRAY | Freq: Four times a day (QID) | RESPIRATORY_TRACT | Status: DC | PRN

## 2015-03-28 MED ORDER — HYDROCODONE-HOMATROPINE 5-1.5 MG/5ML PO SYRP: 5.0000 mL | ORAL_SOLUTION | Freq: Three times a day (TID) | ORAL | Status: DC | PRN

## 2015-03-28 NOTE — Progress Notes (Signed)
   Subjective:    Patient ID: [name], female    DOB: 1946-06-25, 69 y.o.   MRN: [id number]  HPI  Two-week history of upper respiratory infection symptoms. Has deep congested cough. Has had fever and malaise. Not sure why she put off coming for evaluation for respiratory infection with her heart condition. Generally takes antibiotics and sometimes steroids for her to get better.    Review of Systems     Objective:   Physical Exam  HENT:  Head: Normocephalic and atraumatic.  Right Ear: External ear normal.  Left Ear: External ear normal.  Mouth/Throat: Oropharynx is clear and moist. No oropharyngeal exudate.  Neck: Neck supple. No JVD present. No thyromegaly present.  Cardiovascular: Normal rate, regular rhythm and normal heart sounds.   Pulmonary/Chest: Effort normal. No respiratory distress. She has wheezes. She has no rales. She exhibits no tenderness.  No rales or wheezing  Musculoskeletal: She exhibits no edema.  Lymphadenopathy:    She has no cervical adenopathy.  Vitals reviewed.  Pulse oximetry 91%       Assessment & Plan:  Acute bronchitis  Nonischemic cardiomyopathy  Hypoxemia  Plan: Levaquin 500 milligrams daily for 10 days. Sterapred DS 10 mg 6 day dosepak going from 60 mg to 0 mg over 7 days. Hycodan 1 teaspoon by mouth every 8 hours when necessary cough. Albuterol inhaler 2 sprays by mouth 4 times daily.

## 2015-04-03 ENCOUNTER — Other Ambulatory Visit: Payer: Self-pay | Admitting: Internal Medicine

## 2015-04-03 ENCOUNTER — Other Ambulatory Visit: Payer: Self-pay | Admitting: Cardiology

## 2015-04-13 ENCOUNTER — Encounter: Payer: Self-pay | Admitting: Internal Medicine

## 2015-04-13 NOTE — Patient Instructions (Signed)
Take prednisone in tapering course as directed. Take Hycodan as needed for cough. Levaquin 500 milligrams daily for 10 days. Use albuterol inhaler 4 times daily.

## 2015-05-01 ENCOUNTER — Other Ambulatory Visit: Payer: Medicare Other | Admitting: Internal Medicine

## 2015-05-01 DIAGNOSIS — E119 Type 2 diabetes mellitus without complications: Secondary | ICD-10-CM | POA: Diagnosis not present

## 2015-05-01 DIAGNOSIS — E559 Vitamin D deficiency, unspecified: Secondary | ICD-10-CM

## 2015-05-01 DIAGNOSIS — Z79899 Other long term (current) drug therapy: Secondary | ICD-10-CM

## 2015-05-01 DIAGNOSIS — E039 Hypothyroidism, unspecified: Secondary | ICD-10-CM | POA: Diagnosis not present

## 2015-05-01 DIAGNOSIS — E785 Hyperlipidemia, unspecified: Secondary | ICD-10-CM

## 2015-05-01 DIAGNOSIS — R5383 Other fatigue: Secondary | ICD-10-CM

## 2015-05-01 LAB — LIPID PANEL
CHOL/HDL RATIO: 3.2 ratio (ref <=5.0)
CHOLESTEROL: 179 mg/dL (ref 125–200)
HDL: 56 mg/dL (ref >=46)
LDL CALC: 113 mg/dL (ref <130)
TRIGLYCERIDES: 50 mg/dL (ref <150)
VLDL: 10 mg/dL (ref <30)

## 2015-05-01 LAB — COMPLETE METABOLIC PANEL WITH GFR
ALK PHOS: 64 U/L (ref 33–130)
ALT: 10 U/L (ref 6–29)
AST: 12 U/L (ref 10–35)
Albumin: 3.7 g/dL (ref 3.6–5.1)
BUN: 20 mg/dL (ref 7–25)
CHLORIDE: 100 mmol/L (ref 98–110)
CO2: 31 mmol/L (ref 20–31)
CREATININE: 0.64 mg/dL (ref 0.50–0.99)
Calcium: 8.8 mg/dL (ref 8.6–10.4)
GFR, Est Non African American: >89 mL/min (ref >=60)
Glucose, Bld: 104 mg/dL — ABNORMAL HIGH (ref 65–99)
POTASSIUM: 3.9 mmol/L (ref 3.5–5.3)
Sodium: 140 mmol/L (ref 135–146)
Total Bilirubin: 0.6 mg/dL (ref 0.2–1.2)
Total Protein: 6.3 g/dL (ref 6.1–8.1)

## 2015-05-01 LAB — HEMOGLOBIN A1C
HEMOGLOBIN A1C: 6.2 % — AB (ref <5.7)
MEAN PLASMA GLUCOSE: 131 mg/dL — AB (ref <117)

## 2015-05-01 LAB — CBC WITH DIFFERENTIAL/PLATELET
Basophils Absolute: 0 10*3/uL (ref 0.0–0.1)
Basophils Relative: 1 % (ref 0–1)
EOS PCT: 3 % (ref 0–5)
Eosinophils Absolute: 0.1 10*3/uL (ref 0.0–0.7)
HCT: 33.3 % — ABNORMAL LOW (ref 36.0–46.0)
HEMOGLOBIN: 11.6 g/dL — AB (ref 12.0–15.0)
LYMPHS ABS: 1.8 10*3/uL (ref 0.7–4.0)
LYMPHS PCT: 48 % — AB (ref 12–46)
MCH: 34.3 pg — ABNORMAL HIGH (ref 26.0–34.0)
MCHC: 34.8 g/dL (ref 30.0–36.0)
MCV: 98.5 fL (ref 78.0–100.0)
MONO ABS: 0.3 10*3/uL (ref 0.1–1.0)
MONOS PCT: 9 % (ref 3–12)
MPV: 9.1 fL (ref 8.6–12.4)
NEUTROS ABS: 1.5 10*3/uL — AB (ref 1.7–7.7)
Neutrophils Relative %: 39 % — ABNORMAL LOW (ref 43–77)
Platelets: 307 10*3/uL (ref 150–400)
RBC: 3.38 MIL/uL — ABNORMAL LOW (ref 3.87–5.11)
RDW: 16.2 % — AB (ref 11.5–15.5)
WBC: 3.8 10*3/uL — ABNORMAL LOW (ref 4.0–10.5)

## 2015-05-01 LAB — TSH: TSH: 0.277 u[IU]/mL — ABNORMAL LOW (ref 0.350–4.500)

## 2015-05-02 ENCOUNTER — Encounter: Payer: Self-pay | Admitting: Internal Medicine

## 2015-05-02 ENCOUNTER — Telehealth: Payer: Self-pay | Admitting: Internal Medicine

## 2015-05-02 ENCOUNTER — Ambulatory Visit (INDEPENDENT_AMBULATORY_CARE_PROVIDER_SITE_OTHER): Payer: Medicare Other | Admitting: Internal Medicine

## 2015-05-02 VITALS — BP 118/76 | HR 80 | Temp 97.9°F | Ht 60.0 in | Wt 201.0 lb

## 2015-05-02 DIAGNOSIS — E669 Obesity, unspecified: Secondary | ICD-10-CM

## 2015-05-02 DIAGNOSIS — Z8659 Personal history of other mental and behavioral disorders: Secondary | ICD-10-CM | POA: Diagnosis not present

## 2015-05-02 DIAGNOSIS — E119 Type 2 diabetes mellitus without complications: Secondary | ICD-10-CM

## 2015-05-02 DIAGNOSIS — I429 Cardiomyopathy, unspecified: Secondary | ICD-10-CM | POA: Diagnosis not present

## 2015-05-02 DIAGNOSIS — E785 Hyperlipidemia, unspecified: Secondary | ICD-10-CM | POA: Diagnosis not present

## 2015-05-02 DIAGNOSIS — Z0000 Encounter for general adult medical examination without abnormal findings: Secondary | ICD-10-CM

## 2015-05-02 DIAGNOSIS — Z8709 Personal history of other diseases of the respiratory system: Secondary | ICD-10-CM

## 2015-05-02 DIAGNOSIS — E8881 Metabolic syndrome: Secondary | ICD-10-CM | POA: Diagnosis not present

## 2015-05-02 DIAGNOSIS — I10 Essential (primary) hypertension: Secondary | ICD-10-CM

## 2015-05-02 DIAGNOSIS — Z95810 Presence of automatic (implantable) cardiac defibrillator: Secondary | ICD-10-CM | POA: Diagnosis not present

## 2015-05-02 DIAGNOSIS — K219 Gastro-esophageal reflux disease without esophagitis: Secondary | ICD-10-CM

## 2015-05-02 DIAGNOSIS — I428 Other cardiomyopathies: Secondary | ICD-10-CM

## 2015-05-02 DIAGNOSIS — I447 Left bundle-branch block, unspecified: Secondary | ICD-10-CM

## 2015-05-02 LAB — POCT URINALYSIS DIPSTICK
BILIRUBIN UA: NEGATIVE
Blood, UA: NEGATIVE
GLUCOSE UA: NEGATIVE
Ketones, UA: NEGATIVE
Leukocytes, UA: NEGATIVE
NITRITE UA: NEGATIVE
PH UA: 6
Protein, UA: NEGATIVE
SPEC GRAV UA: 1.015
Urobilinogen, UA: NEGATIVE

## 2015-05-02 LAB — VITAMIN D 25 HYDROXY (VIT D DEFICIENCY, FRACTURES): Vit D, 25-Hydroxy: 41 ng/mL (ref 30–100)

## 2015-05-02 MED ORDER — LEVOTHYROXINE SODIUM 100 MCG PO TABS: 100.0000 ug | ORAL_TABLET | Freq: Every day | ORAL | Status: DC

## 2015-05-02 NOTE — Progress Notes (Addendum)
Subjective:    Patient ID: [name], female    DOB: 01-08-1946, 69 y.o.   MRN: [id number]  HPI 69 year old Female for health maintenance exam and evaluation of multiple medical issues. Not exercising much but plans to start. Getting over URI for which she was seen here. Cough went on for some time.  Followed by Cardiology for non ischemic cardiomyopathy. TSH is low but was normal 3 months ago.   History of metabolic syndrome, hyperlipidemia, GE reflux, hypothyroidism, nonischemic cardiomyopathy, cardiac defibrillator, mitral regurgitation, left bundle branch block, obesity, hypertension, depression.  Patient does not smoke. Occasionally drinks beer.  Has living will.  No known drug allergies.  Dislocated right shoulder in 2001. Multiple surgeries as a child. Tonsillectomy at age 79. History of allergic rhinitis and occasionally gets acute bronchospasm.  Social history: She has retired but works part-time during [employer] at [employer]. Patient is single and completed 4 years of college. Previously worked at [employer].  Ophthalmologist is Dr. Herbert Deaner. She wears glasses.  Family history: Father died of lung cancer in his 43s. Mother died at age 61 of an MI with history of possible diabetes. No siblings. No children.    Review of Systems  Constitutional: Positive for fatigue.  Respiratory:       Had protracted cough for some time after recent respiratory infection  Cardiovascular: Negative for chest pain, palpitations and leg swelling.  Genitourinary: Negative.   Neurological: Negative.   Hematological: Negative.   Psychiatric/Behavioral:       History of depression       Objective:   Physical Exam  Constitutional: She is oriented to person, place, and time. She appears well-developed and well-nourished. No distress.  HENT:  Head: Normocephalic and atraumatic.  Right Ear: External ear normal.  Mouth/Throat: Oropharynx is clear and  moist. No oropharyngeal exudate.  Eyes: Conjunctivae and EOM are normal. Pupils are equal, round, and reactive to light. Right eye exhibits no discharge. Left eye exhibits no discharge. No scleral icterus.  Neck: Neck supple. No JVD present. No thyromegaly present.  Cardiovascular: Normal rate and regular rhythm.   Murmur heard. Pulmonary/Chest: Effort normal and breath sounds normal. She has no wheezes.  Breasts normal female without masses  Abdominal: Bowel sounds are normal. She exhibits no distension and no mass. There is no rebound and no guarding.  Musculoskeletal: She exhibits edema.  Neurological: She is alert and oriented to person, place, and time. She has normal reflexes. No cranial nerve deficit.  Skin: Skin is warm and dry. No rash noted. She is not diaphoretic.  Psychiatric: She has a normal mood and affect. Her behavior is normal. Judgment and thought content normal.  Vitals reviewed.         Assessment & Plan:  Nonischemic cardiomyopathy with defibrillator  Essential hypertension-stable  Hyperlipidemia  Metabolic syndrome  Obesity  Control type 2 diabetes without complication  Allergic rhinitis  Hypothyroidism  GE reflux  History of depression  Mitral regurgitation  History of recent cough which has resolved  History of asthma with respiratory infections  Plan: Continue same medications and return in 6 months.  Subjective:   Patient presents for Medicare Annual/Subsequent preventive examination.  Review Past Medical/Family/Social: See above   Risk Factors  Current exercise habits: Not able to exercise much due to cardiomyopathy Dietary issues discussed: Low fat low carbohydrate  Cardiac risk factors: Hyperlipidemia, hypertension, diabetes mellitus, mother's history  Depression Screen  (Note: if answer to either  of the following is "Yes", a more complete depression screening is indicated)   Over the past two weeks, have you felt down,  depressed or hopeless? No  Over the past two weeks, have you felt little interest or pleasure in doing things? No Have you lost interest or pleasure in daily life? No Do you often feel hopeless? No Do you cry easily over simple problems? No   Activities of Daily Living  In your present state of health, do you have any difficulty performing the following activities?:   Driving? No  Managing money? No  Feeding yourself? No  Getting from bed to chair? yes Climbing a flight of stairs? yes Preparing food and eating?: No  Bathing or showering? Yes- gets fatigued  Getting dressed: yes Getting to the toilet? yes Using the toilet:yes Moving around from place to place: yes In the past year have you fallen or had a near fall?:yes Are you sexually active? No  Do you have more than one partner? No   Hearing Difficulties: No  Do you often ask people to speak up or repeat themselves? yes Do you experience ringing or noises in your ears? No  Do you have difficulty understanding soft or whispered voices? yes Do you feel that you have a problem with memory? No Do you often misplace items? No    Home Safety:  Do you have a smoke alarm at your residence? no Do you have grab bars in the bathroom? no Do you have throw rugs in your house? no   Cognitive Testing  Alert? Yes Normal Appearance?Yes  Oriented to person? Yes Place? Yes  Time? Yes  Recall of three objects? Yes  Can perform simple calculations? Yes  Displays appropriate judgment?Yes  Can read the correct time from a watch face?Yes   List the Names of Other Physician/Practitioners you currently use:  See referral list for the physicians patient is currently seeing.     Review of Systems: As above  Objective:     General appearance: Appears stated age and obese  Head: Normocephalic, without obvious abnormality, atraumatic  Eyes: conj clear, EOMi PEERLA  Ears: normal TM's and external ear canals both ears  Nose: Nares  normal. Septum midline. Mucosa normal. No drainage or sinus tenderness.  Throat: lips, mucosa, and tongue normal; teeth and gums normal  Neck: no adenopathy, no carotid bruit, no JVD, supple, symmetrical, trachea midline and thyroid not enlarged, symmetric, no tenderness/mass/nodules  No CVA tenderness.  Lungs: clear to auscultation bilaterally  Breasts: normal appearance, no masses or tenderness, implantable defibrillator present Heart: regular rate and rhythm, S1, S2 normal, mitral regurgitation murmur present click, rub or gallop  Abdomen: soft, non-tender; bowel sounds normal; no masses, no organomegaly  Musculoskeletal: ROM normal in all joints, no crepitus, no deformity, Normal muscle strengthen. Back  is symmetric, no curvature. Skin: Skin color, texture, turgor normal. No rashes or lesions  Lymph nodes: Cervical, supraclavicular, and axillary nodes normal.  Neurologic: CN 2 -12 Normal, Normal symmetric reflexes. Normal coordination and gait  Psych: Alert & Oriented x 3, Mood appear stable.    Assessment:    Annual wellness medicare exam   Plan:    During the course of the visit the patient was educated and counseled about appropriate screening and preventive services including:  Annual flu vaccine  Annual mammogram      Patient Instructions (the written plan) was given to the patient.  Medicare Attestation  I have personally reviewed:  The patient's medical  and social history  Their use of alcohol, tobacco or illicit drugs  Their current medications and supplements  The patient's functional ability including ADLs,fall risks, home safety risks, cognitive, and hearing and visual impairment  Diet and physical activities  Evidence for depression or mood disorders  The patient's weight, height, BMI, and visual acuity have been recorded in the chart. I have made referrals, counseling, and provided education to the patient based on review of the above and I have provided the  patient with a written personalized care plan for preventive services.

## 2015-05-02 NOTE — Telephone Encounter (Signed)
Dr. Renold Genta is referring patient to Dr. Sharol Given for bilateral shoulder pain, decreased ROM and possible frozen shoulder (bilaterally).  Spoke with Dr. Jess Barters office @ [phone]; appointment made for Wed, 8/31 @ 2:45 p.m.  Fax [phone].  Will fax patient demographics and notes over to their office.    LMOM for patient to call our office to provide appointment information to patient.

## 2015-05-03 ENCOUNTER — Other Ambulatory Visit: Payer: Self-pay | Admitting: Internal Medicine

## 2015-05-03 LAB — MICROALBUMIN / CREATININE URINE RATIO
Creatinine, Urine: 48.7 mg/dL
Microalb Creat Ratio: 8.2 mg/g (ref 0.0–30.0)
Microalb, Ur: 0.4 mg/dL (ref <2.0)

## 2015-05-05 NOTE — Telephone Encounter (Signed)
Patient called back this afternoon.  Provided appointment information to her.  Appointment with Dr. Sharol Given 8/31 @ 2:45; arrive by 2:30.  Patient verbalized understanding of these instructions.

## 2015-05-06 ENCOUNTER — Ambulatory Visit
Admission: RE | Admit: 2015-05-06 | Discharge: 2015-05-06 | Disposition: A | Discharge status: Home / Self Care | Payer: Medicare Other | Source: Ambulatory Visit | Attending: Internal Medicine | Admitting: Internal Medicine

## 2015-05-06 ENCOUNTER — Telehealth: Payer: Self-pay | Admitting: *Deleted

## 2015-05-06 DIAGNOSIS — Z1231 Encounter for screening mammogram for malignant neoplasm of breast: Secondary | ICD-10-CM | POA: Diagnosis not present

## 2015-05-06 DIAGNOSIS — Z780 Asymptomatic menopausal state: Secondary | ICD-10-CM | POA: Diagnosis not present

## 2015-05-06 DIAGNOSIS — M8580 Other specified disorders of bone density and structure, unspecified site: Secondary | ICD-10-CM

## 2015-05-06 DIAGNOSIS — M8589 Other specified disorders of bone density and structure, multiple sites: Secondary | ICD-10-CM | POA: Diagnosis not present

## 2015-05-06 NOTE — Telephone Encounter (Signed)
Spoke with patient reviewed Bone density results patient will add calcium with Vit D to daily medications

## 2015-05-07 ENCOUNTER — Other Ambulatory Visit: Payer: Self-pay | Admitting: Internal Medicine

## 2015-05-07 ENCOUNTER — Ambulatory Visit (INDEPENDENT_AMBULATORY_CARE_PROVIDER_SITE_OTHER): Payer: Medicare Other | Admitting: *Deleted

## 2015-05-07 DIAGNOSIS — I5022 Chronic systolic (congestive) heart failure: Secondary | ICD-10-CM | POA: Diagnosis not present

## 2015-05-07 DIAGNOSIS — Z95810 Presence of automatic (implantable) cardiac defibrillator: Secondary | ICD-10-CM

## 2015-05-07 DIAGNOSIS — I429 Cardiomyopathy, unspecified: Secondary | ICD-10-CM | POA: Diagnosis not present

## 2015-05-07 DIAGNOSIS — R928 Other abnormal and inconclusive findings on diagnostic imaging of breast: Secondary | ICD-10-CM

## 2015-05-07 DIAGNOSIS — I428 Other cardiomyopathies: Secondary | ICD-10-CM

## 2015-05-07 LAB — CUP PACEART INCLINIC DEVICE CHECK
Brady Statistic RA Percent Paced: 0 %
Date Time Interrogation Session: 20160824040000
HighPow Impedance: 38 Ohm
HighPow Impedance: 55 Ohm
Lead Channel Impedance Value: 493 Ohm
Lead Channel Sensing Intrinsic Amplitude: 3.5 mV
Lead Channel Setting Pacing Amplitude: 2 V
MDC IDC MSMT LEADCHNL RA PACING THRESHOLD AMPLITUDE: 0.6 V
MDC IDC MSMT LEADCHNL RA PACING THRESHOLD PULSEWIDTH: 0.4 ms
MDC IDC MSMT LEADCHNL RV IMPEDANCE VALUE: 584 Ohm
MDC IDC MSMT LEADCHNL RV PACING THRESHOLD AMPLITUDE: 0.6 V
MDC IDC MSMT LEADCHNL RV PACING THRESHOLD PULSEWIDTH: 0.4 ms
MDC IDC MSMT LEADCHNL RV SENSING INTR AMPL: 14.7 mV
MDC IDC PG SERIAL: 160860
MDC IDC SET LEADCHNL RV PACING AMPLITUDE: 2.4 V
MDC IDC SET LEADCHNL RV PACING PULSEWIDTH: 0.4 ms
MDC IDC SET LEADCHNL RV SENSING SENSITIVITY: 0.6 mV
MDC IDC SET ZONE DETECTION INTERVAL: 273 ms
MDC IDC SET ZONE DETECTION INTERVAL: 300 ms
MDC IDC STAT BRADY RV PERCENT PACED: 1 % — AB
Zone Setting Detection Interval: 353 ms

## 2015-05-07 NOTE — Progress Notes (Signed)
ICD check in clinic. Normal device function. Thresholds and sensing consistent with previous device measurements. Impedance trends stable over time. 6 NSVT, 2 EGMs, longest 8 beats. No mode switches. Histogram distribution appropriate for patient and level of activity. No changes made this session. Device programmed at appropriate safety margins. Device programmed to optimize intrinsic conduction. Estimated longevity 70yrs. ROV w/ device clinic 08/06/15 & w/ GT 5/17.

## 2015-05-12 ENCOUNTER — Ambulatory Visit
Admission: RE | Admit: 2015-05-12 | Discharge: 2015-05-12 | Disposition: A | Discharge status: Home / Self Care | Payer: Medicare Other | Source: Ambulatory Visit | Attending: Internal Medicine | Admitting: Internal Medicine

## 2015-05-12 ENCOUNTER — Other Ambulatory Visit: Payer: Self-pay | Admitting: Internal Medicine

## 2015-05-12 DIAGNOSIS — N6001 Solitary cyst of right breast: Secondary | ICD-10-CM | POA: Diagnosis not present

## 2015-05-12 DIAGNOSIS — N6310 Unspecified lump in the right breast, unspecified quadrant: Secondary | ICD-10-CM

## 2015-05-12 DIAGNOSIS — R928 Other abnormal and inconclusive findings on diagnostic imaging of breast: Secondary | ICD-10-CM

## 2015-05-14 DIAGNOSIS — M7541 Impingement syndrome of right shoulder: Secondary | ICD-10-CM | POA: Diagnosis not present

## 2015-05-14 DIAGNOSIS — M7542 Impingement syndrome of left shoulder: Secondary | ICD-10-CM | POA: Diagnosis not present

## 2015-05-16 ENCOUNTER — Encounter: Payer: Self-pay | Admitting: Internal Medicine

## 2015-05-21 ENCOUNTER — Ambulatory Visit
Admission: RE | Admit: 2015-05-21 | Discharge: 2015-05-21 | Disposition: A | Discharge status: Home / Self Care | Payer: Medicare Other | Source: Ambulatory Visit | Attending: Internal Medicine | Admitting: Internal Medicine

## 2015-05-21 ENCOUNTER — Other Ambulatory Visit: Payer: Self-pay | Admitting: Internal Medicine

## 2015-05-21 DIAGNOSIS — N6001 Solitary cyst of right breast: Secondary | ICD-10-CM | POA: Diagnosis not present

## 2015-05-21 DIAGNOSIS — N6310 Unspecified lump in the right breast, unspecified quadrant: Secondary | ICD-10-CM

## 2015-06-09 ENCOUNTER — Other Ambulatory Visit: Payer: Medicare Other | Admitting: Internal Medicine

## 2015-06-09 ENCOUNTER — Ambulatory Visit (INDEPENDENT_AMBULATORY_CARE_PROVIDER_SITE_OTHER): Payer: Medicare Other | Admitting: Cardiology

## 2015-06-09 ENCOUNTER — Encounter: Payer: Self-pay | Admitting: Cardiology

## 2015-06-09 VITALS — BP 100/70 | HR 74 | Ht 60.0 in | Wt 201.0 lb

## 2015-06-09 DIAGNOSIS — I5022 Chronic systolic (congestive) heart failure: Secondary | ICD-10-CM | POA: Diagnosis not present

## 2015-06-09 DIAGNOSIS — I447 Left bundle-branch block, unspecified: Secondary | ICD-10-CM

## 2015-06-09 DIAGNOSIS — I428 Other cardiomyopathies: Secondary | ICD-10-CM

## 2015-06-09 DIAGNOSIS — I429 Cardiomyopathy, unspecified: Secondary | ICD-10-CM | POA: Diagnosis not present

## 2015-06-09 DIAGNOSIS — Z95810 Presence of automatic (implantable) cardiac defibrillator: Secondary | ICD-10-CM | POA: Diagnosis not present

## 2015-06-09 DIAGNOSIS — E039 Hypothyroidism, unspecified: Secondary | ICD-10-CM | POA: Diagnosis not present

## 2015-06-09 LAB — TSH: TSH: 0.576 u[IU]/mL (ref 0.350–4.500)

## 2015-06-09 NOTE — Assessment & Plan Note (Signed)
She had normal coronary arteries in September, 2009. Her cardiomyopathy is nonischemic. She is on appropriate medications. She's had some problem with lightheadedness and borderline hypotension over time. Because of this, she is on only a small dose of an ACE inhibitor. I have considered the newer combination medicines that can be considered for patients with heart failure. I'm concerned that she would have significant hypotension. Therefore I will not change any of her medicines at this time.

## 2015-06-09 NOTE — Assessment & Plan Note (Signed)
ICD is in place and followed carefully by Dr. Lovena Le.

## 2015-06-09 NOTE — Patient Instructions (Signed)
Medication Instructions:  Same-no changes  Labwork: None  Testing/Procedures: None  Follow-Up: Your physician wants you to follow-up in: 6 month with Dr Acie Fredrickson. You will receive a reminder letter in the mail two months in advance. If you don't receive a letter, please call our office to schedule the follow-up appointment.

## 2015-06-09 NOTE — Progress Notes (Signed)
Cardiology Office Note   Date:  06/09/2015   ID:  [name], [name] [birth year], MRN [id number]  PCP:  Elby Showers, MD  Cardiologist:  Dola Argyle, MD   Chief Complaint  Patient presents with  . Appointment    Follow-up cardiomyopathy      History of Present Illness: [name] is a 69 y.o. female who presents today to follow up cardiomyopathy. She is doing very well. She is not having any chest pain or shortness of breath. She's had no syncope or presyncope. Her ICD is followed carefully by Dr. Lovena Le. I saw her last October, 2015. She had an echo shortly after that. Her EF was in the 30-35% range. This is slightly better than it had been. We have her on the maximum doses of medicines that she can tolerate.    Past Medical History  Diagnosis Date  . Nonischemic cardiomyopathy     Normal coronary arteries, catheterization, September, 2009  . LBBB (left bundle branch block)     old  . ICD (implantable cardiac defibrillator) in place     10/2009; Dr. Lovena Le  . Hypothyroidism   . GERD (gastroesophageal reflux disease)   . Dyslipidemia   . Borderline diabetes   . Mitral regurgitation     Mild, echo, September, 2010  . Overweight(278.02)   . Ejection fraction < 50%     EF 25-30%, September, 2010  . Diabetes mellitus without complication     Borderline  . Systolic CHF, chronic     no recent problems  . Cataract   . Automatic implantable cardioverter-defibrillator in situ     Dr. Beckie Salts follows-next visit- [phone]    Past Surgical History  Procedure Laterality Date  . Tonsillectomy  1952  . Cardiac defibrillator placement    . Heart catherization    . Cardiac catheterization      '09 last  . Eye surgery Left     x 3-"droopy eyelid"  . Colonoscopy N/A 11/29/2013    Procedure: COLONOSCOPY;  Surgeon: Jerene Bears, MD;  Location: [facility];  Service: Gastroenterology;  Laterality: N/A;  . Colonoscopy N/A 02/25/2015    Procedure: COLONOSCOPY;  Surgeon: Jerene Bears, MD;  Location: [facility];  Service: Gastroenterology;  Laterality: N/A;    Patient Active Problem List   Diagnosis Date Noted  . History of colonic polyps   . Benign neoplasm of descending colon   . Diverticulosis of colon without hemorrhage 01/28/2014  . Serrated adenoma of colon 01/28/2014  . Nonspecific abnormal finding in stool contents 11/29/2013  . Colon cancer screening 11/29/2013  . History of depression 08/11/2012  . Nonischemic cardiomyopathy   . Systolic CHF, chronic   . LBBB (left bundle branch block)   . Automatic implantable cardioverter-defibrillator in situ   . Hypothyroidism   . GERD (gastroesophageal reflux disease)   . Dyslipidemia   . Type 2 diabetes mellitus   . Mitral regurgitation   . Overweight(278.02)   . Ejection fraction < 50%       Current Outpatient Prescriptions  Medication Sig Dispense Refill  . albuterol (PROVENTIL HFA;VENTOLIN HFA) 108 (90 BASE) MCG/ACT inhaler Inhale 2 puffs into the lungs every 6 (six) hours as needed for wheezing or shortness of breath. 1 Inhaler prn  . aspirin 81 MG tablet Take 81 mg by mouth every morning.     . carvedilol (COREG) 25 MG tablet TAKE 1 TABLET BY MOUTH 2 TIMES DAILY WITH A MEAL. 60 tablet  11  . Cholecalciferol (VITAMIN D PO) Take 2 tablets by mouth every morning.     . citalopram (CELEXA) 40 MG tablet TAKE 1 TABLET (40 MG TOTAL) BY MOUTH EVERY EVENING. 90 tablet 1  . furosemide (LASIX) 40 MG tablet TAKE 1 TABLET BY MOUTH DAILY. 30 tablet 11  . glucose blood (ACCU-CHEK AVIVA) test strip Check daily.  For Diabetes 250.00 100 each 12  . HYDROcodone-homatropine (HYCODAN) 5-1.5 MG/5ML syrup Take 5 mLs by mouth every 8 (eight) hours as needed for cough. 120 mL 0  . Lancets MISC Check glucose daily.  Diabetes 250.00 100 each 11  . levothyroxine (SYNTHROID, LEVOTHROID) 100 MCG tablet Take 1 tablet (100 mcg total) by mouth daily. 90 tablet 0  . metFORMIN (GLUCOPHAGE) 500 MG tablet Take 500 mg by mouth 2  (two) times daily with a meal.    . Multiple Vitamin (MULTIVITAMIN WITH MINERALS) TABS tablet Take 1 tablet by mouth every morning. chewable    . omeprazole (PRILOSEC) 20 MG capsule TAKE ONE CAPSULE BY MOUTH EVERY DAY 90 capsule 1  . ramipril (ALTACE) 2.5 MG capsule Take 1 capsule (2.5 mg total) by mouth every evening. (Patient taking differently: Take 2.5 mg by mouth daily at 3 pm. ) 90 capsule 3  . spironolactone (ALDACTONE) 25 MG tablet TAKE 1 TABLET (25 MG TOTAL) BY MOUTH DAILY. 30 tablet 9   No current facility-administered medications for this visit.    Allergies:   Review of patient's allergies indicates no known allergies.    Social History:  The patient  reports that she has never smoked. She has never used smokeless tobacco. She reports that she drinks alcohol. She reports that she does not use illicit drugs.   Family History:  The patient's family history includes Cancer in her father; Heart attack in her mother; Heart disease in her mother; Hypertension in her mother; Lung cancer in her father; Pancreatic cancer in her father; Stroke in her paternal grandfather. There is no history of Colon cancer, Diabetes, Kidney disease, or Liver disease.    ROS:  Please see the history of present illness.  Patient denies fever, chills, headache, sweats, rash, change in vision, change in hearing, chest pain, cough, nausea or vomiting, urinary symptoms. All other systems are reviewed and are negative.      PHYSICAL EXAM: VS:  BP 100/70 mmHg  Pulse 74  Ht 5' (1.524 m)  Wt 201 lb (91.173 kg)  BMI 39.26 kg/m2 , Patient is oriented to person time and place. Affect is normal. She is overweight. She is stable. Head is atraumatic. Sclera and conjunctiva are normal. There is no jugular venous distention. Lungs are clear. Respiratory effort is not labored. Cardiac exam reveals an S1 and S2. There are no clicks or significant murmurs. The abdomen is soft. There is no peripheral edema. There are no  musculoskeletal deformities. There are no skin rashes.   EKG:   EKG is done today and reviewed by me. There is no change from the past. There is sinus rhythm. There is underlying left bundle-branch block.   Recent Labs: 05/01/2015: ALT 10; BUN 20; Creat 0.64; Hemoglobin 11.6*; Platelets 307; Potassium 3.9; Sodium 140; TSH 0.277*    Lipid Panel    Component Value Date/Time   CHOL 179 05/01/2015 0939   TRIG 50 05/01/2015 0939   HDL 56 05/01/2015 0939   CHOLHDL 3.2 05/01/2015 0939   VLDL 10 05/01/2015 0939   LDLCALC 113 05/01/2015 5053  Wt Readings from Last 3 Encounters:  06/09/15 201 lb (91.173 kg)  05/02/15 201 lb (91.173 kg)  03/28/15 202 lb (91.627 kg)      Current medicines are reviewed  The patient understands her medications.     ASSESSMENT AND PLAN:

## 2015-06-09 NOTE — Assessment & Plan Note (Signed)
Her volume status is under good control. No change in therapy. 

## 2015-06-11 ENCOUNTER — Telehealth: Payer: Self-pay | Admitting: *Deleted

## 2015-06-11 DIAGNOSIS — M7541 Impingement syndrome of right shoulder: Secondary | ICD-10-CM | POA: Diagnosis not present

## 2015-06-11 DIAGNOSIS — M7542 Impingement syndrome of left shoulder: Secondary | ICD-10-CM | POA: Diagnosis not present

## 2015-06-11 NOTE — Telephone Encounter (Signed)
Left message for patient to return call to review TSH results

## 2015-06-12 NOTE — Telephone Encounter (Signed)
Reviewed lab results with patient.

## 2015-06-13 ENCOUNTER — Encounter: Payer: Self-pay | Admitting: Internal Medicine

## 2015-06-13 ENCOUNTER — Ambulatory Visit (INDEPENDENT_AMBULATORY_CARE_PROVIDER_SITE_OTHER): Payer: Medicare Other | Admitting: Internal Medicine

## 2015-06-13 VITALS — BP 106/72 | HR 79 | Temp 97.5°F | Ht 60.0 in | Wt 200.0 lb

## 2015-06-13 DIAGNOSIS — M8580 Other specified disorders of bone density and structure, unspecified site: Secondary | ICD-10-CM | POA: Diagnosis not present

## 2015-06-13 DIAGNOSIS — Z23 Encounter for immunization: Secondary | ICD-10-CM

## 2015-06-13 DIAGNOSIS — E039 Hypothyroidism, unspecified: Secondary | ICD-10-CM

## 2015-06-13 NOTE — Patient Instructions (Addendum)
Calcium and Vitamin D for osteopenia. Continue same dose of thyroid replacement. Return in March for six-month recheck. Consider physical therapy for shoulder issues if water aerobics does not work. Repeat bone density study in 2 years.

## 2015-06-13 NOTE — Progress Notes (Signed)
   Subjective:    Patient ID: [name], female    DOB: 1946/03/10, 69 y.o.   MRN: [id number]  HPI For follow-up on hypothyroidism. At last visit, TSH was low. We decreased her dosage and now TSH is within normal limits. She thinks her hair looks better and she feels a bit better. Was having some issues with sleep on higher dose of thyroid replacement. Recently had done of breast cyst aspirated which proved to be benign. She had bone density study. Femur had T score of -2.1. Recommended calcium and vitamin D for now with repeat study in 2 years.  She saw Dr. Sharol Given recently and had shoulders injected bilaterally. He has recommended water aerobics for her.    Review of Systems     Objective:   Physical Exam Not examined. 25 minutes speaking with patient about all these medical issues. She continues to be under the care of cardiology and will be seeing Dr. Acie Fredrickson.       Assessment & Plan:  Cardiomyopathy-followed by cardiologist  Hypothyroidism-TSH is now within normal limits. Repeat in 6 months  Hyperlipidemia-repeat lipid panel liver functions in 6 months  Bilateral shoulder arthropathy-status post injections by orthopedist in water aerobics recommended  Obesity-continue to encourage diet and exercise  Fibrocystic breast disease-status post breast aspiration which was benign  Osteopenia-recommending calcium and vitamin D with repeat study in 2 years. Return in March for six-month recheck.

## 2015-06-14 ENCOUNTER — Other Ambulatory Visit: Payer: Self-pay | Admitting: Cardiology

## 2015-07-05 ENCOUNTER — Other Ambulatory Visit: Payer: Self-pay | Admitting: Internal Medicine

## 2015-07-13 ENCOUNTER — Other Ambulatory Visit: Payer: Self-pay | Admitting: Cardiology

## 2015-07-18 DIAGNOSIS — H40013 Open angle with borderline findings, low risk, bilateral: Secondary | ICD-10-CM | POA: Diagnosis not present

## 2015-08-01 ENCOUNTER — Other Ambulatory Visit: Payer: Self-pay | Admitting: Internal Medicine

## 2015-08-06 ENCOUNTER — Ambulatory Visit (INDEPENDENT_AMBULATORY_CARE_PROVIDER_SITE_OTHER): Payer: Medicare Other | Admitting: *Deleted

## 2015-08-06 ENCOUNTER — Encounter: Payer: Self-pay | Admitting: Internal Medicine

## 2015-08-06 DIAGNOSIS — I429 Cardiomyopathy, unspecified: Secondary | ICD-10-CM | POA: Diagnosis not present

## 2015-08-06 DIAGNOSIS — I447 Left bundle-branch block, unspecified: Secondary | ICD-10-CM | POA: Diagnosis not present

## 2015-08-06 DIAGNOSIS — I428 Other cardiomyopathies: Secondary | ICD-10-CM

## 2015-08-06 LAB — CUP PACEART INCLINIC DEVICE CHECK
Date Time Interrogation Session: 20161123050000
HIGH POWER IMPEDANCE MEASURED VALUE: 56 Ohm
HighPow Impedance: 38 Ohm
Implantable Lead Implant Date: 20110221
Implantable Lead Location: 753860
Implantable Lead Model: 157
Lead Channel Impedance Value: 588 Ohm
Lead Channel Pacing Threshold Amplitude: 0.6 V
Lead Channel Pacing Threshold Pulse Width: 0.4 ms
Lead Channel Pacing Threshold Pulse Width: 0.4 ms
Lead Channel Setting Pacing Amplitude: 2 V
Lead Channel Setting Pacing Amplitude: 2.4 V
MDC IDC LEAD IMPLANT DT: 20110221
MDC IDC LEAD LOCATION: 753859
MDC IDC LEAD SERIAL: 301957
MDC IDC MSMT LEADCHNL RA IMPEDANCE VALUE: 490 Ohm
MDC IDC MSMT LEADCHNL RA PACING THRESHOLD AMPLITUDE: 0.5 V
MDC IDC MSMT LEADCHNL RA SENSING INTR AMPL: 5.8 mV
MDC IDC MSMT LEADCHNL RV SENSING INTR AMPL: 16.1 mV
MDC IDC SET LEADCHNL RV PACING PULSEWIDTH: 0.4 ms
MDC IDC SET LEADCHNL RV SENSING SENSITIVITY: 0.6 mV
Pulse Gen Serial Number: 160860

## 2015-08-06 NOTE — Progress Notes (Signed)
ICD check in clinic. Normal device function. Thresholds and sensing consistent with previous device measurements. Impedance trends stable over time. (3) brief NSVT episodes---max dur 10 bts per EGMs. No AHR episodes. Histogram distribution appropriate for patient and level of activity. No changes made this session. Device programmed at appropriate safety margins. Device programmed to optimize intrinsic conduction. Estimated longevity 6.5 years. Pt will follow up with the [facility] in 3 months and with [facility] in 01-2016. Patient education completed including shock plan.

## 2015-08-11 NOTE — Patient Instructions (Signed)
It was a pleasure to see you today. Continue same medications and return in 6 months. At that time will have lipid panel liver functions hemoglobin A1c and repeat TSH. Try the diet and exercise.

## 2015-09-08 ENCOUNTER — Other Ambulatory Visit: Payer: Self-pay | Admitting: Internal Medicine

## 2015-11-07 ENCOUNTER — Encounter: Payer: Self-pay | Admitting: Internal Medicine

## 2015-11-07 ENCOUNTER — Ambulatory Visit (INDEPENDENT_AMBULATORY_CARE_PROVIDER_SITE_OTHER): Payer: Medicare Other | Admitting: *Deleted

## 2015-11-07 DIAGNOSIS — I5022 Chronic systolic (congestive) heart failure: Secondary | ICD-10-CM

## 2015-11-07 DIAGNOSIS — I428 Other cardiomyopathies: Secondary | ICD-10-CM

## 2015-11-07 DIAGNOSIS — I429 Cardiomyopathy, unspecified: Secondary | ICD-10-CM | POA: Diagnosis not present

## 2015-11-07 LAB — CUP PACEART INCLINIC DEVICE CHECK
Brady Statistic RV Percent Paced: 1 % — CL
Date Time Interrogation Session: 20170224050000
HIGH POWER IMPEDANCE MEASURED VALUE: 56 Ohm
HighPow Impedance: 38 Ohm
Implantable Lead Implant Date: 20110221
Implantable Lead Implant Date: 20110221
Implantable Lead Location: 753860
Implantable Lead Serial Number: 301957
Lead Channel Impedance Value: 473 Ohm
Lead Channel Pacing Threshold Amplitude: 0.6 V
Lead Channel Pacing Threshold Pulse Width: 0.4 ms
Lead Channel Pacing Threshold Pulse Width: 0.4 ms
Lead Channel Setting Pacing Amplitude: 2 V
Lead Channel Setting Pacing Amplitude: 2.4 V
MDC IDC LEAD LOCATION: 753859
MDC IDC LEAD MODEL: 157
MDC IDC MSMT BATTERY REMAINING LONGEVITY: 78 mo
MDC IDC MSMT LEADCHNL RA PACING THRESHOLD AMPLITUDE: 0.5 V
MDC IDC MSMT LEADCHNL RA SENSING INTR AMPL: 5.5 mV
MDC IDC MSMT LEADCHNL RV IMPEDANCE VALUE: 598 Ohm
MDC IDC MSMT LEADCHNL RV SENSING INTR AMPL: 17.9 mV
MDC IDC PG SERIAL: 160860
MDC IDC SET LEADCHNL RV PACING PULSEWIDTH: 0.4 ms
MDC IDC SET LEADCHNL RV SENSING SENSITIVITY: 0.6 mV
MDC IDC STAT BRADY RA PERCENT PACED: 0 %

## 2015-11-07 NOTE — Progress Notes (Signed)
ICD check in clinic. Normal device function. Thresholds and sensing consistent with previous device measurements. Impedance trends stable over time. (5) NSVT episodes---AT/NSVT per available EGMs. No mode switches. Histogram distribution appropriate for patient and level of activity. No changes made this session. Device programmed at appropriate safety margins. Device programmed to optimize intrinsic conduction. Estimated longevity 6.5 years. Pt will follow up with [facility] in 3 months. Patient education completed including shock plan.

## 2015-12-02 ENCOUNTER — Other Ambulatory Visit: Payer: Medicare Other | Admitting: Internal Medicine

## 2015-12-02 ENCOUNTER — Other Ambulatory Visit: Payer: Self-pay | Admitting: Internal Medicine

## 2015-12-02 DIAGNOSIS — E119 Type 2 diabetes mellitus without complications: Secondary | ICD-10-CM | POA: Diagnosis not present

## 2015-12-02 DIAGNOSIS — E039 Hypothyroidism, unspecified: Secondary | ICD-10-CM | POA: Diagnosis not present

## 2015-12-02 DIAGNOSIS — E785 Hyperlipidemia, unspecified: Secondary | ICD-10-CM

## 2015-12-02 DIAGNOSIS — D649 Anemia, unspecified: Secondary | ICD-10-CM | POA: Diagnosis not present

## 2015-12-02 DIAGNOSIS — Z79899 Other long term (current) drug therapy: Secondary | ICD-10-CM | POA: Diagnosis not present

## 2015-12-02 LAB — HEPATIC FUNCTION PANEL
ALK PHOS: 70 U/L (ref 33–130)
ALT: 10 U/L (ref 6–29)
AST: 12 U/L (ref 10–35)
Albumin: 4 g/dL (ref 3.6–5.1)
BILIRUBIN DIRECT: 0.1 mg/dL (ref <=0.2)
BILIRUBIN INDIRECT: 0.6 mg/dL (ref 0.2–1.2)
Total Bilirubin: 0.7 mg/dL (ref 0.2–1.2)
Total Protein: 6.7 g/dL (ref 6.1–8.1)

## 2015-12-02 LAB — LIPID PANEL
CHOLESTEROL: 184 mg/dL (ref 125–200)
HDL: 67 mg/dL (ref >=46)
LDL Cholesterol: 101 mg/dL (ref <130)
TRIGLYCERIDES: 81 mg/dL (ref <150)
Total CHOL/HDL Ratio: 2.7 Ratio (ref <=5.0)
VLDL: 16 mg/dL (ref <30)

## 2015-12-02 LAB — TSH: TSH: 3.11 mIU/L

## 2015-12-03 LAB — HEMOGLOBIN A1C
Hgb A1c MFr Bld: 6.2 % — ABNORMAL HIGH (ref <5.7)
Mean Plasma Glucose: 131 mg/dL — ABNORMAL HIGH (ref <117)

## 2015-12-05 ENCOUNTER — Ambulatory Visit (INDEPENDENT_AMBULATORY_CARE_PROVIDER_SITE_OTHER): Payer: Medicare Other | Admitting: Internal Medicine

## 2015-12-05 ENCOUNTER — Encounter: Payer: Self-pay | Admitting: Internal Medicine

## 2015-12-05 VITALS — BP 108/64 | HR 70 | Temp 97.5°F | Resp 20 | Ht 60.0 in | Wt 200.0 lb

## 2015-12-05 DIAGNOSIS — E039 Hypothyroidism, unspecified: Secondary | ICD-10-CM

## 2015-12-05 DIAGNOSIS — I10 Essential (primary) hypertension: Secondary | ICD-10-CM | POA: Diagnosis not present

## 2015-12-05 DIAGNOSIS — E8881 Metabolic syndrome: Secondary | ICD-10-CM

## 2015-12-05 DIAGNOSIS — D649 Anemia, unspecified: Secondary | ICD-10-CM

## 2015-12-05 DIAGNOSIS — E119 Type 2 diabetes mellitus without complications: Secondary | ICD-10-CM | POA: Diagnosis not present

## 2015-12-05 DIAGNOSIS — F419 Anxiety disorder, unspecified: Secondary | ICD-10-CM

## 2015-12-05 DIAGNOSIS — E785 Hyperlipidemia, unspecified: Secondary | ICD-10-CM | POA: Diagnosis not present

## 2015-12-05 DIAGNOSIS — R710 Precipitous drop in hematocrit: Secondary | ICD-10-CM

## 2015-12-05 DIAGNOSIS — F418 Other specified anxiety disorders: Secondary | ICD-10-CM

## 2015-12-05 DIAGNOSIS — F329 Major depressive disorder, single episode, unspecified: Secondary | ICD-10-CM

## 2015-12-05 LAB — CBC WITH DIFFERENTIAL/PLATELET
Basophils Absolute: 0.1 10*3/uL (ref 0.0–0.1)
Basophils Relative: 1 % (ref 0–1)
EOS ABS: 0.2 10*3/uL (ref 0.0–0.7)
EOS PCT: 3 % (ref 0–5)
HEMATOCRIT: 33.4 % — AB (ref 36.0–46.0)
Hemoglobin: 11.1 g/dL — ABNORMAL LOW (ref 12.0–15.0)
LYMPHS ABS: 1.7 10*3/uL (ref 0.7–4.0)
LYMPHS PCT: 30 % (ref 12–46)
MCH: 33 pg (ref 26.0–34.0)
MCHC: 33.2 g/dL (ref 30.0–36.0)
MCV: 99.4 fL (ref 78.0–100.0)
MONOS PCT: 10 % (ref 3–12)
MPV: 9.4 fL (ref 8.6–12.4)
Monocytes Absolute: 0.6 10*3/uL (ref 0.1–1.0)
Neutro Abs: 3.1 10*3/uL (ref 1.7–7.7)
Neutrophils Relative %: 56 % (ref 43–77)
PLATELETS: 339 10*3/uL (ref 150–400)
RBC: 3.36 MIL/uL — ABNORMAL LOW (ref 3.87–5.11)
RDW: 16.3 % — ABNORMAL HIGH (ref 11.5–15.5)
WBC: 5.6 10*3/uL (ref 4.0–10.5)

## 2015-12-05 NOTE — Patient Instructions (Addendum)
Recheck TSH in 4 weeeks. Continue same dose of thyroid replacement. Take folate 1 mg daily and follow up folate level in August.

## 2015-12-06 LAB — MICROALBUMIN, URINE: MICROALB UR: 0.3 mg/dL

## 2015-12-09 ENCOUNTER — Encounter: Payer: Self-pay | Admitting: Cardiovascular Disease

## 2015-12-09 ENCOUNTER — Ambulatory Visit (INDEPENDENT_AMBULATORY_CARE_PROVIDER_SITE_OTHER): Payer: Medicare Other | Admitting: Cardiovascular Disease

## 2015-12-09 VITALS — BP 104/72 | HR 70 | Ht 60.0 in | Wt 200.0 lb

## 2015-12-09 DIAGNOSIS — I5022 Chronic systolic (congestive) heart failure: Secondary | ICD-10-CM | POA: Diagnosis not present

## 2015-12-09 LAB — VITAMIN B12: VITAMIN B 12: 619 pg/mL (ref 200–1100)

## 2015-12-09 LAB — FOLATE: Folate: 5.9 ng/mL (ref >5.4)

## 2015-12-09 LAB — IRON AND TIBC
%SAT: 70 % — AB (ref 11–50)
IRON: 234 ug/dL — AB (ref 45–160)
TIBC: 332 ug/dL (ref 250–450)
UIBC: 98 ug/dL — ABNORMAL LOW (ref 125–400)

## 2015-12-09 NOTE — Progress Notes (Signed)
Cardiology Office Note   Date:  12/09/2015   ID:  [name], DOB 1946/02/27, MRN [id number]  PCP:  Elby Showers, MD  Cardiologist:  Thayer Headings, MD   Chief Complaint  Patient presents with  . Cardiomyopathy    NO CHEST PAIN, SOB WITH EXERTION AND NO SWELLING.  . Congestive Heart Failure   Problem List 1. Chronic systolic congestive heart failure 2. ICD 3. Hypothyroidism   History of Present Illness: [name] is a 70 y.o. female who presents today to follow up cardiomyopathy. She is doing very well. She is not having any chest pain or shortness of breath. She's had no syncope or presyncope. Her ICD is followed carefully by Dr. Lovena Le. I saw her last October, 2015. She had an echo shortly after that. Her EF was in the 30-35% range. This is slightly better than it had been. We have her on the maximum doses of medicines that she can tolerate.  December 09, 2015:  Doing well.    Former patient of Dr. Ron Parker.  No CP ,  Chronic DOE with activity .  No dyspnea with sitting or lying down .    Past Medical History  Diagnosis Date  . Nonischemic cardiomyopathy ([place])     Normal coronary arteries, catheterization, September, 2009  . LBBB (left bundle branch block)     old  . ICD (implantable cardiac defibrillator) in place     10/2009; Dr. Lovena Le  . Hypothyroidism   . GERD (gastroesophageal reflux disease)   . Dyslipidemia   . Borderline diabetes   . Mitral regurgitation     Mild, echo, September, 2010  . Overweight(278.02)   . Ejection fraction < 50%     EF 25-30%, September, 2010  . Diabetes mellitus without complication (HCC)     Borderline  . Systolic CHF, chronic (HCC)     no recent problems  . Cataract   . Automatic implantable cardioverter-defibrillator in situ     Dr. Beckie Salts follows-next visit- [phone]    Past Surgical History  Procedure Laterality Date  . Tonsillectomy  1952  . Cardiac defibrillator placement    . Heart catherization    .  Cardiac catheterization      '09 last  . Eye surgery Left     x 3-"droopy eyelid"  . Colonoscopy N/A 11/29/2013    Procedure: COLONOSCOPY;  Surgeon: Jerene Bears, MD;  Location: [facility];  Service: Gastroenterology;  Laterality: N/A;  . Colonoscopy N/A 02/25/2015    Procedure: COLONOSCOPY;  Surgeon: Jerene Bears, MD;  Location: [facility];  Service: Gastroenterology;  Laterality: N/A;    Patient Active Problem List   Diagnosis Date Noted  . History of colonic polyps   . Benign neoplasm of descending colon   . Diverticulosis of colon without hemorrhage 01/28/2014  . Serrated adenoma of colon 01/28/2014  . Nonspecific abnormal finding in stool contents 11/29/2013  . Colon cancer screening 11/29/2013  . History of depression 08/11/2012  . Nonischemic cardiomyopathy ([place])   . Systolic CHF, chronic ([place])   . LBBB (left bundle branch block)   . Automatic implantable cardioverter-defibrillator in situ   . Hypothyroidism   . GERD (gastroesophageal reflux disease)   . Dyslipidemia   . Type 2 diabetes mellitus ([place])   . Mitral regurgitation   . Overweight(278.02)   . Ejection fraction < 50%       Current Outpatient Prescriptions  Medication Sig Dispense Refill  . albuterol (PROVENTIL  HFA;VENTOLIN HFA) 108 (90 BASE) MCG/ACT inhaler Inhale 2 puffs into the lungs every 6 (six) hours as needed for wheezing or shortness of breath. 1 Inhaler prn  . aspirin 81 MG tablet Take 81 mg by mouth every morning.     . carvedilol (COREG) 25 MG tablet TAKE 1 TABLET BY MOUTH 2 TIMES DAILY WITH A MEAL. 60 tablet 11  . Cholecalciferol (VITAMIN D PO) Take 2 tablets by mouth every morning.     . citalopram (CELEXA) 40 MG tablet TAKE 1 TABLET (40 MG TOTAL) BY MOUTH EVERY EVENING. 90 tablet 1  . furosemide (LASIX) 40 MG tablet TAKE 1 TABLET BY MOUTH DAILY. 30 tablet 11  . glucose blood (ACCU-CHEK AVIVA) test strip Check daily.  For Diabetes 250.00 100 each 12  . Lancets MISC Check glucose daily.   Diabetes 250.00 100 each 11  . levothyroxine (SYNTHROID, LEVOTHROID) 100 MCG tablet TAKE 1 TABLET (100 MCG TOTAL) BY MOUTH DAILY. 90 tablet 1  . metFORMIN (GLUCOPHAGE) 500 MG tablet Take 500 mg by mouth 2 (two) times daily with a meal.    . Multiple Vitamin (MULTIVITAMIN WITH MINERALS) TABS tablet Take 1 tablet by mouth every morning. chewable    . omeprazole (PRILOSEC) 20 MG capsule TAKE ONE CAPSULE BY MOUTH EVERY DAY 90 capsule 3  . ramipril (ALTACE) 2.5 MG capsule TAKE 1 CAPSULE BY MOUTH EVERY EVENING. 90 capsule 3  . spironolactone (ALDACTONE) 25 MG tablet TAKE 1 TABLET (25 MG TOTAL) BY MOUTH DAILY. 30 tablet 6   No current facility-administered medications for this visit.    Allergies:   Review of patient's allergies indicates no known allergies.    Social History:  The patient  reports that she has never smoked. She has never used smokeless tobacco. She reports that she drinks alcohol. She reports that she does not use illicit drugs.   Family History:  The patient's family history includes Cancer in her father; Heart attack in her mother; Heart disease in her mother; Hypertension in her mother; Lung cancer in her father; Pancreatic cancer in her father; Stroke in her paternal grandfather. There is no history of Colon cancer, Diabetes, Kidney disease, or Liver disease.    ROS:  Please see the history of present illness.  Patient denies fever, chills, headache, sweats, rash, change in vision, change in hearing, chest pain, cough, nausea or vomiting, urinary symptoms. All other systems are reviewed and are negative.      PHYSICAL EXAM: VS:  BP 104/72 mmHg  Pulse 70  Ht 5' (1.524 m)  Wt 200 lb (90.719 kg)  BMI 39.06 kg/m2 , Patient is oriented to person time and place. Affect is normal. She is overweight. She is stable. Head is atraumatic. Sclera and conjunctiva are normal. There is no jugular venous distention. Lungs are clear. Respiratory effort is not labored. Cardiac exam reveals an  S1 and S2. There are no clicks or significant murmurs. The abdomen is soft. There is no peripheral edema. There are no musculoskeletal deformities. There are no skin rashes.   EKG:   EKG is done today and reviewed by me. There is no change from the past. There is sinus rhythm. There is underlying left bundle-branch block.   Recent Labs: 05/01/2015: BUN 20; Creat 0.64; Potassium 3.9; Sodium 140 12/02/2015: ALT 10; TSH 3.11 12/05/2015: Hemoglobin 11.1*; Platelets 339    Lipid Panel    Component Value Date/Time   CHOL 184 12/02/2015 0912   TRIG 81 12/02/2015 0912  HDL 67 12/02/2015 0912   CHOLHDL 2.7 12/02/2015 0912   VLDL 16 12/02/2015 0912   LDLCALC 101 12/02/2015 0912      Wt Readings from Last 3 Encounters:  12/09/15 200 lb (90.719 kg)  12/05/15 200 lb (90.719 kg)  06/13/15 200 lb (90.719 kg)      Current medicines are reviewed  The patient understands her medications.     ASSESSMENT AND PLAN:  1. Chronic systolic congestive heart failure - continue current meds .   She is on Coreg, altace, aldactone   2. ICD - plans per Dr. Lovena Le   3. Hypothyroidism - followed by her primary     Mathieu Schloemer, Wonda Cheng, MD  12/09/2015 3:01 PM    [address],  [place], [place]  [id number] Pager [phone] Phone: [phone]; Fax: [phone]

## 2015-12-09 NOTE — Patient Instructions (Signed)
Medication Instructions:  Your physician recommends that you continue on your current medications as directed. Please refer to the Current Medication list given to you today.   Labwork: Your physician recommends that you return for lab work in: 10 months on the day of or a few days before your office visit with Dr. Nahser.  You will need to FAST for this appointment - nothing to eat or drink after midnight the night before except water.   Testing/Procedures: None Ordered   Follow-Up: Your physician wants you to follow-up in: 10 months with Dr. Nahser. You will receive a reminder letter in the mail two months in advance. If you don't receive a letter, please call our office to schedule the follow-up appointment.   If you need a refill on your cardiac medications before your next appointment, please call your pharmacy.   Thank you for choosing [facility]! Michelle Swinyer, RN [phone]   

## 2015-12-10 NOTE — Progress Notes (Signed)
   Subjective:    Patient ID: [name], female    DOB: [birth year], 70 y.o.   MRN: [id number]  HPI 70 year old Female in today for follow-up on hypothyroidism, hyperlipidemia, type 2 diabetes mellitus, metabolic syndrome, obesity. She also has a history of nonischemic cardiomyopathy.Feels well and is doing okay. History of depression. This is stable. No new complaints. We've been watching her hemoglobin recently and  most recently was 11.1 g. Previously was 12.4g in August 2015. MCV is normal. Checked B-12 folate iron and iron binding capacity today. Hemoglobin A1c stable at 6.2%. TSH is within normal limits on thyroid replacement therapy. Also lipid panel and liver functions are normal.      Review of Systems     Objective:   Physical Exam  Skin warm and dry. Nodes none. No thyromegaly. No JVD. Chest clear to auscultation without rales or wheezing. Cardiac exam regular rate and rhythm normal S1 and S2. Extremities without edema.      Assessment & Plan:  Hypothyroidism-TSH is 3.11 and previously was much better than this running slightly below 1. Recheck in 4 weeks.  Hyperlipidemia-Records don't indicate she is currently taking statins. Will ask her about this. She is to take Crestor back in 2011.  Controlled type 2 diabetes mellitus-hemoglobin A1c stable at 123456  Metabolic syndrome  Obesity  Nonischemic cardiomyopathy  Mild anemia-anemia studies pending  Plan: Patient could benefit from diet and exercise and weight loss. Discussed this today. Return in 6 months or as needed. Will be due for physical exam in 6 months.  Addendum: Her folate level is  low normal at 5.9%. Advise patient to take folate supplement regularly 1 mg daily and follow-up in August.

## 2015-12-11 ENCOUNTER — Telehealth: Payer: Self-pay | Admitting: Internal Medicine

## 2015-12-11 ENCOUNTER — Encounter: Payer: Self-pay | Admitting: Internal Medicine

## 2015-12-11 NOTE — Telephone Encounter (Signed)
Left message for patient to take folate 1 mg daily until follow-up appointment in August. Folate level was slightly low at 5.9 normal being greater than 5.4. This may help with hemoglobin which is slightly low.

## 2015-12-12 ENCOUNTER — Other Ambulatory Visit: Payer: Self-pay

## 2015-12-12 MED ORDER — FOLIC ACID 1 MG PO TABS: 1.0000 mg | ORAL_TABLET | Freq: Every day | ORAL | Status: DC

## 2015-12-12 NOTE — Telephone Encounter (Signed)
Patient states that she will take the folate and vitamin B. She states that she is intolerant to statins. I have sent a prescription to the pharmacy for the folvite 1mg .

## 2015-12-31 ENCOUNTER — Other Ambulatory Visit: Payer: Self-pay | Admitting: Cardiology

## 2016-01-08 ENCOUNTER — Other Ambulatory Visit: Payer: Medicare Other | Admitting: Internal Medicine

## 2016-01-08 DIAGNOSIS — E039 Hypothyroidism, unspecified: Secondary | ICD-10-CM

## 2016-01-08 LAB — TSH: TSH: 4.87 mIU/L — ABNORMAL HIGH

## 2016-01-12 ENCOUNTER — Telehealth: Payer: Self-pay

## 2016-01-12 MED ORDER — LEVOTHYROXINE SODIUM 112 MCG PO TABS: 112.0000 ug | ORAL_TABLET | Freq: Every day | ORAL | Status: DC

## 2016-01-12 NOTE — Telephone Encounter (Signed)
Pt notified. She has not missed any doses so new prescription sent.

## 2016-01-12 NOTE — Telephone Encounter (Signed)
-----   Message from Elby Showers, MD sent at 01/08/2016 10:14 PM EDT ----- TSH is slightly higher than it was a month ago. Assuming she's not missed any doses, increase levothyroxine to 0.112 mg daily and follow-up at appointment in August

## 2016-01-22 DIAGNOSIS — H47391 Other disorders of optic disc, right eye: Secondary | ICD-10-CM | POA: Diagnosis not present

## 2016-01-22 DIAGNOSIS — H2513 Age-related nuclear cataract, bilateral: Secondary | ICD-10-CM | POA: Diagnosis not present

## 2016-01-22 DIAGNOSIS — H25013 Cortical age-related cataract, bilateral: Secondary | ICD-10-CM | POA: Diagnosis not present

## 2016-01-22 DIAGNOSIS — E119 Type 2 diabetes mellitus without complications: Secondary | ICD-10-CM | POA: Diagnosis not present

## 2016-01-22 DIAGNOSIS — H40013 Open angle with borderline findings, low risk, bilateral: Secondary | ICD-10-CM | POA: Diagnosis not present

## 2016-01-22 LAB — HM DIABETES EYE EXAM

## 2016-01-23 ENCOUNTER — Telehealth: Payer: Self-pay

## 2016-01-23 NOTE — Telephone Encounter (Signed)
Documentation received from [facility]- no diabetic retinopathy detected.

## 2016-01-29 ENCOUNTER — Other Ambulatory Visit: Payer: Self-pay | Admitting: Cardiology

## 2016-02-01 ENCOUNTER — Other Ambulatory Visit: Payer: Self-pay | Admitting: Internal Medicine

## 2016-02-11 ENCOUNTER — Encounter: Payer: Self-pay | Admitting: Internal Medicine

## 2016-02-11 ENCOUNTER — Ambulatory Visit (INDEPENDENT_AMBULATORY_CARE_PROVIDER_SITE_OTHER): Payer: Medicare Other | Admitting: Internal Medicine

## 2016-02-11 VITALS — BP 122/74 | HR 82 | Ht 59.0 in | Wt 201.8 lb

## 2016-02-11 DIAGNOSIS — I429 Cardiomyopathy, unspecified: Secondary | ICD-10-CM

## 2016-02-11 DIAGNOSIS — I5022 Chronic systolic (congestive) heart failure: Secondary | ICD-10-CM

## 2016-02-11 DIAGNOSIS — I428 Other cardiomyopathies: Secondary | ICD-10-CM

## 2016-02-11 NOTE — Progress Notes (Signed)
HPI Ms. Cogswell returns today for followup. She is a very pleasant 70 year old woman with a nonischemic cardiomyopathy, chronic class II systolic heart failure, obesity, status post ICD implantation. In the interim, she has done well. She denies chest pain, shortness of breath, or syncope. No recent ICD shocks. No peripheral edema.  No Known Allergies   Current Outpatient Prescriptions  Medication Sig Dispense Refill  . albuterol (PROVENTIL HFA;VENTOLIN HFA) 108 (90 BASE) MCG/ACT inhaler Inhale 2 puffs into the lungs every 6 (six) hours as needed for wheezing or shortness of breath. 1 Inhaler prn  . aspirin 81 MG tablet Take 81 mg by mouth every morning.     . carvedilol (COREG) 25 MG tablet TAKE 1 TABLET BY MOUTH 2 TIMES DAILY WITH A MEAL. 60 tablet 9  . Cholecalciferol (VITAMIN D PO) Take 2 tablets by mouth every morning.     . citalopram (CELEXA) 40 MG tablet TAKE 1 TABLET (40 MG TOTAL) BY MOUTH EVERY EVENING. 90 tablet 1  . folic acid (FOLVITE) 1 MG tablet Take 1 tablet (1 mg total) by mouth daily. 90 tablet 0  . furosemide (LASIX) 40 MG tablet TAKE 1 TABLET BY MOUTH DAILY. 30 tablet 11  . glucose blood (ACCU-CHEK AVIVA) test strip Check daily.  For Diabetes 250.00 100 each 12  . Lancets MISC Check glucose daily.  Diabetes 250.00 100 each 11  . levothyroxine (SYNTHROID, LEVOTHROID) 112 MCG tablet Take 1 tablet (112 mcg total) by mouth daily. 90 tablet 1  . metFORMIN (GLUCOPHAGE) 500 MG tablet Take 500 mg by mouth 2 (two) times daily with a meal.    . metFORMIN (GLUCOPHAGE) 500 MG tablet TAKE 1 TABLET BY MOUTH TWICE A DAY 180 tablet 2  . Multiple Vitamin (MULTIVITAMIN WITH MINERALS) TABS tablet Take 1 tablet by mouth every morning. chewable    . omeprazole (PRILOSEC) 20 MG capsule TAKE ONE CAPSULE BY MOUTH EVERY DAY 90 capsule 3  . ramipril (ALTACE) 2.5 MG capsule TAKE 1 CAPSULE BY MOUTH EVERY EVENING. 90 capsule 3  . spironolactone (ALDACTONE) 25 MG tablet TAKE 1 TABLET (25 MG TOTAL) BY  MOUTH DAILY. 30 tablet 11   No current facility-administered medications for this visit.     Past Medical History  Diagnosis Date  . Nonischemic cardiomyopathy ([place])     Normal coronary arteries, catheterization, September, 2009  . LBBB (left bundle branch block)     old  . ICD (implantable cardiac defibrillator) in place     10/2009; Dr. Lovena Le  . Hypothyroidism   . GERD (gastroesophageal reflux disease)   . Dyslipidemia   . Borderline diabetes   . Mitral regurgitation     Mild, echo, September, 2010  . Overweight(278.02)   . Ejection fraction < 50%     EF 25-30%, September, 2010  . Diabetes mellitus without complication (HCC)     Borderline  . Systolic CHF, chronic ([facility])     no recent problems  . Cataract   . Automatic implantable cardioverter-defibrillator in situ     Dr. Beckie Salts follows-next visit- 5'15    ROS:   All systems reviewed and negative except as noted in the HPI.   Past Surgical History  Procedure Laterality Date  . Tonsillectomy  1952  . Cardiac defibrillator placement    . Heart catherization    . Cardiac catheterization      '09 last  . Eye surgery Left     x 3-"droopy eyelid"  . Colonoscopy N/A 11/29/2013  Procedure: COLONOSCOPY;  Surgeon: Jerene Bears, MD;  Location: [facility];  Service: Gastroenterology;  Laterality: N/A;  . Colonoscopy N/A 02/25/2015    Procedure: COLONOSCOPY;  Surgeon: Jerene Bears, MD;  Location: [facility];  Service: Gastroenterology;  Laterality: N/A;     Family History  Problem Relation Age of Onset  . Heart attack Mother   . Heart disease Mother   . Hypertension Mother   . Lung cancer Father   . Pancreatic cancer Father   . Cancer Father   . Colon cancer Neg Hx   . Diabetes Neg Hx   . Kidney disease Neg Hx   . Liver disease Neg Hx   . Stroke Paternal Grandfather      Social History   Social History  . Marital Status: Single    Spouse Name: N/A  . Number of Children: N/A  . Years of Education:  N/A   Occupational History  . Not on file.   Social History Main Topics  . Smoking status: Never Smoker   . Smokeless tobacco: Never Used  . Alcohol Use: Yes     Comment: 2-4 beers per week  . Drug Use: No  . Sexual Activity: No   Other Topics Concern  . Not on file   Social History Narrative     BP 122/74 mmHg  Pulse 82  Ht 4\' 11"  (1.499 m)  Wt 201 lb 12.8 oz (91.536 kg)  BMI 40.74 kg/m2  SpO2 93%  Physical Exam:  Well appearing middle-age obese woman, NAD HEENT: Unremarkable Neck: 7 cm jugulovenous distention, no thyromegaly Back:  No CVA tenderness Lungs:  Clear with no wheezes, rales, or rhonchi. HEART:  Regular rate rhythm, no murmurs, no rubs, no clicks Abd:  soft, positive bowel sounds, no organomegally, no rebound, no guarding Ext:  2 plus pulses, no edema, no cyanosis, no clubbing Skin:  No rashes no nodules Neuro:  CN II through XII intact, motor grossly intact  DEVICE  Normal device function.  See PaceArt for details.   Assess/Plan: 1. Chronic systolic heart failure - her symptoms remain class 2. She will continue her current meds. She is encouraged to lose weight. 2. ICD - her [place] BiV ICD is working normally. Will recheck in several months. 3. HTN - her blood pressure is well controlled. She will maintain a low sodium diet and continue her current meds.  Mikle Bosworth.D.

## 2016-02-11 NOTE — Patient Instructions (Signed)
Medication Instructions:  Your physician recommends that you continue on your current medications as directed. Please refer to the Current Medication list given to you today.   Labwork: None ordered   Testing/Procedures: None ordered   Follow-Up: Your physician recommends that you schedule a follow-up appointment in: 3 months with device clinic and 12 months with Dr Lovena Le   Any Other Special Instructions Will Be Listed Below (If Applicable).     If you need a refill on your cardiac medications before your next appointment, please call your pharmacy.

## 2016-02-12 LAB — CUP PACEART INCLINIC DEVICE CHECK
Brady Statistic RA Percent Paced: 0 %
Date Time Interrogation Session: 20170531040000
HighPow Impedance: 38 Ohm
HighPow Impedance: 57 Ohm
Implantable Lead Implant Date: 20110221
Implantable Lead Location: 753860
Implantable Lead Model: 157
Implantable Lead Model: 5076
Implantable Lead Serial Number: 301957
Lead Channel Pacing Threshold Amplitude: 0.6 V
Lead Channel Sensing Intrinsic Amplitude: 19.3 mV
Lead Channel Sensing Intrinsic Amplitude: 6.3 mV
Lead Channel Setting Pacing Amplitude: 2.4 V
Lead Channel Setting Pacing Pulse Width: 0.4 ms
Lead Channel Setting Sensing Sensitivity: 0.6 mV
MDC IDC LEAD IMPLANT DT: 20110221
MDC IDC LEAD LOCATION: 753859
MDC IDC MSMT LEADCHNL RA IMPEDANCE VALUE: 462 Ohm
MDC IDC MSMT LEADCHNL RA PACING THRESHOLD AMPLITUDE: 0.5 V
MDC IDC MSMT LEADCHNL RA PACING THRESHOLD PULSEWIDTH: 0.4 ms
MDC IDC MSMT LEADCHNL RV IMPEDANCE VALUE: 621 Ohm
MDC IDC MSMT LEADCHNL RV PACING THRESHOLD PULSEWIDTH: 0.4 ms
MDC IDC PG SERIAL: 160860
MDC IDC SET LEADCHNL RA PACING AMPLITUDE: 2 V
MDC IDC STAT BRADY RV PERCENT PACED: 0 %

## 2016-04-06 ENCOUNTER — Ambulatory Visit (INDEPENDENT_AMBULATORY_CARE_PROVIDER_SITE_OTHER): Payer: Medicare Other | Admitting: Internal Medicine

## 2016-04-06 ENCOUNTER — Encounter: Payer: Self-pay | Admitting: Internal Medicine

## 2016-04-06 VITALS — BP 106/62 | HR 75 | Temp 98.8°F | Ht 59.0 in | Wt 199.0 lb

## 2016-04-06 DIAGNOSIS — J209 Acute bronchitis, unspecified: Secondary | ICD-10-CM | POA: Diagnosis not present

## 2016-04-06 MED ORDER — HYDROCODONE-HOMATROPINE 5-1.5 MG/5ML PO SYRP: 5.0000 mL | ORAL_SOLUTION | Freq: Three times a day (TID) | ORAL | 0 refills | Status: DC | PRN

## 2016-04-06 MED ORDER — PREDNISONE 10 MG PO TABS: ORAL_TABLET | ORAL | 0 refills | Status: DC

## 2016-04-06 MED ORDER — LEVOFLOXACIN 500 MG PO TABS: 500.0000 mg | ORAL_TABLET | Freq: Every day | ORAL | 0 refills | Status: DC

## 2016-04-06 NOTE — Progress Notes (Signed)
   Subjective:    Patient ID: [name], female    DOB: [birth year], 70 y.o.   MRN: [id number]  HPI  70 year old Female with history of Nonischemic cardiomyopathy in today with cough and congestion. Thinks she may have had a bit of wheezing. Symptoms have  been present for about a month. She and a friend fi went to [place] to take care of someone who was ill with liver failure.  She and her friend both came down with respiratory infections. Patient has a history of episodes of bronchitis with some bronchospasm.  She has inhalers on hand  Had similar episode about a year ago.  Has had some discolored sputum production.    Review of Systems see above     Objective:   Physical Exam  Skin warm and dry. Has deep congested cough. TMs and pharynx are clear. Neck is supple without adenopathy. Chest clear to auscultation today without rales or wheezing.      Assessment & Plan:  Acute bronchitis  Plan: Levaquin 500 milligrams daily for 10 days. Sterapred DS 10 mg 6 day dosepak. Hycodan 1 teaspoon by mouth every 6 hours when necessary cough. Rest and drink plenty of fluids.

## 2016-04-06 NOTE — Patient Instructions (Signed)
Sterapred DS 10 mg 6 day dosepak take as directed and tapering course. Levaquin 500 milligrams daily for 10 days. Hycodan 1 teaspoon by mouth every 6 hours when necessary cough.

## 2016-04-26 DIAGNOSIS — H47391 Other disorders of optic disc, right eye: Secondary | ICD-10-CM | POA: Diagnosis not present

## 2016-04-26 DIAGNOSIS — H40013 Open angle with borderline findings, low risk, bilateral: Secondary | ICD-10-CM | POA: Diagnosis not present

## 2016-05-03 ENCOUNTER — Other Ambulatory Visit: Payer: Medicare Other | Admitting: Internal Medicine

## 2016-05-03 DIAGNOSIS — I5022 Chronic systolic (congestive) heart failure: Secondary | ICD-10-CM

## 2016-05-03 DIAGNOSIS — E785 Hyperlipidemia, unspecified: Secondary | ICD-10-CM

## 2016-05-03 DIAGNOSIS — Z0000 Encounter for general adult medical examination without abnormal findings: Secondary | ICD-10-CM | POA: Diagnosis not present

## 2016-05-03 DIAGNOSIS — E039 Hypothyroidism, unspecified: Secondary | ICD-10-CM | POA: Diagnosis not present

## 2016-05-03 DIAGNOSIS — E119 Type 2 diabetes mellitus without complications: Secondary | ICD-10-CM | POA: Diagnosis not present

## 2016-05-03 LAB — CBC WITH DIFFERENTIAL/PLATELET
BASOS ABS: 40 {cells}/uL (ref 0–200)
Basophils Relative: 1 %
EOS PCT: 4 %
Eosinophils Absolute: 160 cells/uL (ref 15–500)
HCT: 34.4 % — ABNORMAL LOW (ref 35.0–45.0)
Hemoglobin: 11.1 g/dL — ABNORMAL LOW (ref 11.7–15.5)
LYMPHS PCT: 46 %
Lymphs Abs: 1840 cells/uL (ref 850–3900)
MCH: 33 pg (ref 27.0–33.0)
MCHC: 32.3 g/dL (ref 32.0–36.0)
MCV: 102.4 fL — ABNORMAL HIGH (ref 80.0–100.0)
MONOS PCT: 7 %
MPV: 9.3 fL (ref 7.5–12.5)
Monocytes Absolute: 280 cells/uL (ref 200–950)
NEUTROS PCT: 42 %
Neutro Abs: 1680 cells/uL (ref 1500–7800)
Platelets: 352 10*3/uL (ref 140–400)
RBC: 3.36 MIL/uL — AB (ref 3.80–5.10)
RDW: 17.1 % — AB (ref 11.0–15.0)
WBC: 4 10*3/uL (ref 3.8–10.8)

## 2016-05-03 LAB — COMPLETE METABOLIC PANEL WITH GFR
ALK PHOS: 62 U/L (ref 33–130)
ALT: 9 U/L (ref 6–29)
AST: 11 U/L (ref 10–35)
Albumin: 3.9 g/dL (ref 3.6–5.1)
BUN: 19 mg/dL (ref 7–25)
CHLORIDE: 99 mmol/L (ref 98–110)
CO2: 32 mmol/L — ABNORMAL HIGH (ref 20–31)
Calcium: 9.4 mg/dL (ref 8.6–10.4)
Creat: 0.87 mg/dL (ref 0.50–0.99)
GFR, EST NON AFRICAN AMERICAN: 68 mL/min (ref >=60)
GFR, Est African American: 79 mL/min (ref >=60)
GLUCOSE: 107 mg/dL — AB (ref 65–99)
POTASSIUM: 4.2 mmol/L (ref 3.5–5.3)
SODIUM: 141 mmol/L (ref 135–146)
Total Bilirubin: 0.7 mg/dL (ref 0.2–1.2)
Total Protein: 6.4 g/dL (ref 6.1–8.1)

## 2016-05-03 LAB — TSH: TSH: 0.93 m[IU]/L

## 2016-05-03 LAB — LIPID PANEL
CHOL/HDL RATIO: 2.8 ratio (ref <=5.0)
CHOLESTEROL: 190 mg/dL (ref 125–200)
HDL: 68 mg/dL (ref >=46)
LDL Cholesterol: 107 mg/dL (ref <130)
Triglycerides: 77 mg/dL (ref <150)
VLDL: 15 mg/dL (ref <30)

## 2016-05-04 ENCOUNTER — Ambulatory Visit (INDEPENDENT_AMBULATORY_CARE_PROVIDER_SITE_OTHER): Payer: Medicare Other | Admitting: Internal Medicine

## 2016-05-04 ENCOUNTER — Encounter: Payer: Self-pay | Admitting: Internal Medicine

## 2016-05-04 VITALS — BP 104/62 | HR 78 | Temp 98.0°F | Ht 59.0 in | Wt 200.5 lb

## 2016-05-04 DIAGNOSIS — F418 Other specified anxiety disorders: Secondary | ICD-10-CM

## 2016-05-04 DIAGNOSIS — E039 Hypothyroidism, unspecified: Secondary | ICD-10-CM

## 2016-05-04 DIAGNOSIS — Z23 Encounter for immunization: Secondary | ICD-10-CM | POA: Diagnosis not present

## 2016-05-04 DIAGNOSIS — E119 Type 2 diabetes mellitus without complications: Secondary | ICD-10-CM

## 2016-05-04 DIAGNOSIS — Z95810 Presence of automatic (implantable) cardiac defibrillator: Secondary | ICD-10-CM

## 2016-05-04 DIAGNOSIS — I428 Other cardiomyopathies: Secondary | ICD-10-CM

## 2016-05-04 DIAGNOSIS — I429 Cardiomyopathy, unspecified: Secondary | ICD-10-CM

## 2016-05-04 DIAGNOSIS — I10 Essential (primary) hypertension: Secondary | ICD-10-CM | POA: Diagnosis not present

## 2016-05-04 DIAGNOSIS — Z0000 Encounter for general adult medical examination without abnormal findings: Secondary | ICD-10-CM

## 2016-05-04 DIAGNOSIS — E8881 Metabolic syndrome: Secondary | ICD-10-CM

## 2016-05-04 DIAGNOSIS — F419 Anxiety disorder, unspecified: Secondary | ICD-10-CM

## 2016-05-04 DIAGNOSIS — F329 Major depressive disorder, single episode, unspecified: Secondary | ICD-10-CM

## 2016-05-04 DIAGNOSIS — E785 Hyperlipidemia, unspecified: Secondary | ICD-10-CM | POA: Diagnosis not present

## 2016-05-04 DIAGNOSIS — K219 Gastro-esophageal reflux disease without esophagitis: Secondary | ICD-10-CM | POA: Diagnosis not present

## 2016-05-04 LAB — MICROALBUMIN / CREATININE URINE RATIO
CREATININE, URINE: 7 mg/dL — AB (ref 20–320)
Microalb, Ur: <0.2 mg/dL

## 2016-05-04 LAB — POCT URINALYSIS DIPSTICK
BILIRUBIN UA: NEGATIVE
Blood, UA: NEGATIVE
GLUCOSE UA: NEGATIVE
KETONES UA: NEGATIVE
LEUKOCYTES UA: NEGATIVE
Nitrite, UA: NEGATIVE
PROTEIN UA: NEGATIVE
SPEC GRAV UA: 1.02
Urobilinogen, UA: 0.2
pH, UA: 5

## 2016-05-04 LAB — VITAMIN D 25 HYDROXY (VIT D DEFICIENCY, FRACTURES): VIT D 25 HYDROXY: 47 ng/mL (ref 30–100)

## 2016-05-04 LAB — HEMOGLOBIN A1C
HEMOGLOBIN A1C: 5.9 % — AB (ref <5.7)
MEAN PLASMA GLUCOSE: 123 mg/dL

## 2016-05-04 MED ORDER — FOLIC ACID 1 MG PO TABS: 1.0000 mg | ORAL_TABLET | Freq: Every day | ORAL | 0 refills | Status: DC

## 2016-05-04 NOTE — Progress Notes (Signed)
Subjective:    Patient ID: [name], female    DOB: [birth year], 70 y.o.   MRN: [id number]  HPI 70 year old White Female for health maintenance exam And evaluation of medical issues. History of metabolic syndrome, hyperlipidemia, GE reflux, hypothyroidism, nonischemic cardiomyopathy, cardiac defibrillator, mitral regurgitation, left bundle branch block, obesity, hypertension, depression.  She does not smoke. Occasionally drinks beer.  She has a living will.  No known drug allergies.  Dislocated right shoulder in 2001. Multiple surgeries as a child. Tonsillectomy at age 16. History of allergic rhinitis and occasionally gets acute bronchospasm.  Ophthalmologist is Dr. Herbert Deaner. She wears glasses.  Social history: She has retired but works part-time during the holiday season and replacements limited. She is single. Completed 4 years of college. Previously worked at [employer].  Family history: Father died of lung cancer in his 48s. Mother died at age 86 of an MI with history of possible diabetes. No siblings. No children.       Review of Systems  Constitutional: Positive for fatigue.  Respiratory:       Shortness of breath with exertion  Gastrointestinal:       History of reflux  Genitourinary: Negative.        Objective:   Physical Exam  Constitutional: She is oriented to person, place, and time. She appears well-developed and well-nourished. No distress.  HENT:  Head: Normocephalic and atraumatic.  Right Ear: External ear normal.  Left Ear: External ear normal.  Mouth/Throat: Oropharynx is clear and moist.  Eyes: Conjunctivae and EOM are normal. Pupils are equal, round, and reactive to light. Right eye exhibits no discharge. Left eye exhibits no discharge.  Neck: Neck supple. No JVD present. No thyromegaly present.  Cardiovascular: Normal rate, regular rhythm and normal heart sounds.   Pulmonary/Chest: She has no wheezes. She has no rales.    Breasts normal female without masses  Abdominal: Bowel sounds are normal. She exhibits no distension and no mass. There is no tenderness. There is no rebound and no guarding.  Genitourinary:  Genitourinary Comments: Pap deferred due to age. Bimanual normal.  Musculoskeletal: She exhibits no edema.  Lymphadenopathy:    She has no cervical adenopathy.  Neurological: She is alert and oriented to person, place, and time. She has normal reflexes. No cranial nerve deficit. Coordination normal.  Skin: Skin is warm and dry. No rash noted. She is not diaphoretic.  Psychiatric: She has a normal mood and affect. Her behavior is normal. Judgment and thought content normal.  Vitals reviewed.         Assessment & Plan:  Nonischemic cardiomyopathy with defibrillator  Essential hypertension-stable  Metabolic syndrome  Hyperlipidemia-lipid panel normal  Obesity-needs to improve diet and exercise regimen  Impaired glucose tolerance. Hemoglobin A1c stable  Allergic rhinitis  Hypothyroidism-stable on thyroid replacement  GE reflux-treated with PPI  History of depression  Mitral regurgitation  History of bronchospasm with respiratory infections  Macrocytosis with normal B-12 and folate levels  Plan: Continue same medications and return in 6 months Flu vaccine given Subjective:   Patient presents for Medicare Annual/Subsequent preventive examination.  Review Past Medical/Family/Social:See above   Risk Factors  Current exercise habits: Walks dog Dietary issues discussed: Low fat low carbohydrate reminded  Cardiac risk factors:Hyperlipidemia, impaired glucose tolerance family history in mother of MI  Depression Screen  (Note: if answer to either of the following is "Yes", a more complete depression screening is indicated)   Over the past two  weeks, have you felt down, depressed or hopeless? No  Over the past two weeks, have you felt little interest or pleasure in doing things?  No Have you lost interest or pleasure in daily life? No Do you often feel hopeless? No Do you cry easily over simple problems? No   Activities of Daily Living  In your present state of health, do you have any difficulty performing the following activities?:   Driving? Some issues driving at night Managing money? No  Feeding yourself? No  Getting from bed to chair? No  Climbing a flight of stairs? Get short of breath going upstairs Preparing food and eating?: No  Bathing or showering? No  Getting dressed: No  Getting to the toilet? No  Using the toilet:No  Moving around from place to place: No  In the past year have you fallen or had a near fall?:No  Are you sexually active? No  Do you have more than one partner? No   Hearing Difficulties: No  Do you often ask people to speak up or repeat themselves? yes Do you experience ringing or noises in your ears? No  Do you have difficulty understanding soft or whispered voices? yes Do you feel that you have a problem with memory? No Do you often misplace items? No    Home Safety:  Do you have a smoke alarm at your residence? no Do you have grab bars in the bathroom? No Do you have throw rugs in your house? No   Cognitive Testing  Alert? Yes Normal Appearance?Yes  Oriented to person? Yes Place? Yes  Time? Yes  Recall of three objects? Yes  Can perform simple calculations? Yes  Displays appropriate judgment?Yes  Can read the correct time from a watch face?Yes   List the Names of Other Physician/Practitioners you currently use:  See referral list for the physicians patient is currently seeing.  Cardiologist   Review of Systems: See above   Objective:     General appearance: Appears stated age and obese  Head: Normocephalic, without obvious abnormality, atraumatic  Eyes: conj clear, EOMi PEERLA  Ears: normal TM's and external ear canals both ears  Nose: Nares normal. Septum midline. Mucosa normal. No drainage or sinus  tenderness.  Throat: lips, mucosa, and tongue normal; teeth and gums normal  Neck: no adenopathy, no carotid bruit, no JVD, supple, symmetrical, trachea midline and thyroid not enlarged, symmetric, no tenderness/mass/nodules  No CVA tenderness.  Lungs: clear to auscultation bilaterally  Breasts: normal appearance, no masses or tenderness,  Defibrillator present    Heart: regular rate and rhythm, S1, S2 normal, no murmur, click, rub or gallop  Abdomen: soft, non-tender; bowel sounds normal; no masses, no organomegaly  Musculoskeletal: ROM normal in all joints, no crepitus, no deformity, Normal muscle strengthen. Back  is symmetric, no curvature. Skin: Skin color, texture, turgor normal. No rashes or lesions  Lymph nodes: Cervical, supraclavicular, and axillary nodes normal.  Neurologic: CN 2 -12 Normal, Normal symmetric reflexes. Normal coordination and gait  Psych: Alert & Oriented x 3, Mood appear stable.    Assessment:    Annual wellness medicare exam   Plan:    During the course of the visit the patient was educated and counseled about appropriate screening and preventive services including:   Annual flu vaccine  Reminded about mammogram     Patient Instructions (the written plan) was given to the patient.  Medicare Attestation  I have personally reviewed:  The patient's medical and social history  Their use of alcohol, tobacco or illicit drugs  Their current medications and supplements  The patient's functional ability including ADLs,fall risks, home safety risks, cognitive, and hearing and visual impairment  Diet and physical activities  Evidence for depression or mood disorders  The patient's weight, height, BMI, and visual acuity have been recorded in the chart. I have made referrals, counseling, and provided education to the patient based on review of the above and I have provided the patient with a written personalized care plan for preventive services.

## 2016-05-07 NOTE — Patient Instructions (Signed)
Please try to work on diet and exercise. You need to lose some weight. Continue same medications. Flu vaccine given. Return in 6 months. Have annual mammogram.

## 2016-05-10 ENCOUNTER — Other Ambulatory Visit: Payer: Self-pay | Admitting: Internal Medicine

## 2016-05-10 DIAGNOSIS — Z1231 Encounter for screening mammogram for malignant neoplasm of breast: Secondary | ICD-10-CM

## 2016-05-12 ENCOUNTER — Ambulatory Visit (INDEPENDENT_AMBULATORY_CARE_PROVIDER_SITE_OTHER): Payer: Medicare Other | Admitting: *Deleted

## 2016-05-12 DIAGNOSIS — Z95810 Presence of automatic (implantable) cardiac defibrillator: Secondary | ICD-10-CM | POA: Diagnosis not present

## 2016-05-12 DIAGNOSIS — I428 Other cardiomyopathies: Secondary | ICD-10-CM

## 2016-05-12 DIAGNOSIS — I429 Cardiomyopathy, unspecified: Secondary | ICD-10-CM | POA: Diagnosis not present

## 2016-05-12 LAB — CUP PACEART INCLINIC DEVICE CHECK
Brady Statistic RA Percent Paced: 0 %
Brady Statistic RV Percent Paced: 0 %
HIGH POWER IMPEDANCE MEASURED VALUE: 38 Ohm
HIGH POWER IMPEDANCE MEASURED VALUE: 57 Ohm
Implantable Lead Implant Date: 20110221
Implantable Lead Location: 753859
Lead Channel Pacing Threshold Amplitude: 0.5 V
Lead Channel Pacing Threshold Amplitude: 0.5 V
Lead Channel Pacing Threshold Pulse Width: 0.4 ms
Lead Channel Sensing Intrinsic Amplitude: 18.1 mV
Lead Channel Setting Sensing Sensitivity: 0.6 mV
MDC IDC LEAD IMPLANT DT: 20110221
MDC IDC LEAD LOCATION: 753860
MDC IDC LEAD MODEL: 157
MDC IDC LEAD SERIAL: 301957
MDC IDC MSMT LEADCHNL RA IMPEDANCE VALUE: 500 Ohm
MDC IDC MSMT LEADCHNL RA SENSING INTR AMPL: 3.9 mV
MDC IDC MSMT LEADCHNL RV IMPEDANCE VALUE: 634 Ohm
MDC IDC MSMT LEADCHNL RV PACING THRESHOLD PULSEWIDTH: 0.4 ms
MDC IDC PG SERIAL: 160860
MDC IDC SESS DTM: 20170830040000
MDC IDC SET LEADCHNL RA PACING AMPLITUDE: 2 V
MDC IDC SET LEADCHNL RV PACING AMPLITUDE: 2.4 V
MDC IDC SET LEADCHNL RV PACING PULSEWIDTH: 0.4 ms

## 2016-05-12 NOTE — Progress Notes (Signed)
ICD check in clinic. Normal device function. Thresholds and sensing consistent with previous device measurements. Impedance trends stable over time. No evidence of any ventricular arrhythmias. 3 NSVT episodes--longest with EGM ~7bts. No changes made this session. Device programmed at appropriate safety margins. Device programmed to optimize intrinsic conduction. Estimated longevity 6 years. Pt enrolled in remote follow-up, Latitude monitor set up with industry assistance today. Patient education completed including shock plan. Latitude on 08/11/16 and ROV with GT in 01/2017.

## 2016-05-18 ENCOUNTER — Ambulatory Visit
Admission: RE | Admit: 2016-05-18 | Discharge: 2016-05-18 | Disposition: A | Discharge status: Home / Self Care | Payer: Medicare Other | Source: Ambulatory Visit | Attending: Internal Medicine | Admitting: Internal Medicine

## 2016-05-18 DIAGNOSIS — Z1231 Encounter for screening mammogram for malignant neoplasm of breast: Secondary | ICD-10-CM

## 2016-07-01 ENCOUNTER — Other Ambulatory Visit: Payer: Self-pay | Admitting: Internal Medicine

## 2016-07-04 ENCOUNTER — Other Ambulatory Visit: Payer: Self-pay | Admitting: Cardiology

## 2016-07-05 ENCOUNTER — Other Ambulatory Visit: Payer: Self-pay | Admitting: Internal Medicine

## 2016-07-28 DIAGNOSIS — H04123 Dry eye syndrome of bilateral lacrimal glands: Secondary | ICD-10-CM | POA: Diagnosis not present

## 2016-07-28 DIAGNOSIS — H40013 Open angle with borderline findings, low risk, bilateral: Secondary | ICD-10-CM | POA: Diagnosis not present

## 2016-08-01 ENCOUNTER — Other Ambulatory Visit: Payer: Self-pay | Admitting: Internal Medicine

## 2016-08-10 ENCOUNTER — Telehealth: Payer: Self-pay | Admitting: Internal Medicine

## 2016-08-10 NOTE — Telephone Encounter (Signed)
Needs OV.  

## 2016-08-10 NOTE — Telephone Encounter (Signed)
Patient is coughing up yellow and green mucus and her eyes are watering.  She wants to know If she needs to come in or have something called in for her.

## 2016-08-10 NOTE — Telephone Encounter (Signed)
An appt was made for the patient for Friday, 08/13/16 at 4:15pm

## 2016-08-11 ENCOUNTER — Ambulatory Visit (INDEPENDENT_AMBULATORY_CARE_PROVIDER_SITE_OTHER): Payer: Medicare Other | Admitting: *Deleted

## 2016-08-11 DIAGNOSIS — I428 Other cardiomyopathies: Secondary | ICD-10-CM | POA: Diagnosis not present

## 2016-08-11 NOTE — Progress Notes (Signed)
Remote ICD transmission.   

## 2016-08-13 ENCOUNTER — Ambulatory Visit (INDEPENDENT_AMBULATORY_CARE_PROVIDER_SITE_OTHER): Payer: Medicare Other | Admitting: Internal Medicine

## 2016-08-13 ENCOUNTER — Encounter: Payer: Self-pay | Admitting: Internal Medicine

## 2016-08-13 VITALS — BP 116/76 | HR 86 | Temp 99.0°F | Ht 59.0 in | Wt 203.0 lb

## 2016-08-13 DIAGNOSIS — J069 Acute upper respiratory infection, unspecified: Secondary | ICD-10-CM

## 2016-08-13 DIAGNOSIS — H1031 Unspecified acute conjunctivitis, right eye: Secondary | ICD-10-CM | POA: Diagnosis not present

## 2016-08-13 MED ORDER — OFLOXACIN 0.3 % OP SOLN: 2.0000 [drp] | Freq: Four times a day (QID) | OPHTHALMIC | 1 refills | Status: DC

## 2016-08-13 MED ORDER — PREDNISONE 10 MG (21) PO TBPK: ORAL_TABLET | ORAL | 0 refills | Status: DC

## 2016-08-13 MED ORDER — LEVOFLOXACIN 500 MG PO TABS: 500.0000 mg | ORAL_TABLET | Freq: Every day | ORAL | 0 refills | Status: DC

## 2016-08-13 NOTE — Progress Notes (Signed)
   Subjective:    Patient ID: [name], female    DOB: [birth year], 70 y.o.   MRN: [id number]  HPI  70 year old Female In with respiratory infection symptoms. Also has noticed irritation of right. She has a history of nonischemic cardiomyopathy. She is leaving for trip to [place] and will be gone on a bus trip to that destination with a number of other folks for several days.  Has noted some wheezing. Tends to have bronchospasm with respiratory infections.    Review of Systems     Objective:   Physical Exam Skin warm and dry. She has conjunctivitis of the right eye. Has a deep congested cough. Neck is supple without significant adenopathy. No JVD. Chest clear to auscultation. TMs are clear.       Assessment & Plan:   Acute bronchitis  Right conjunctivitis  Plan: Levaquin 500 milligrams daily for 10 days. Sterapred DS 10 mg 6 day dosepak. Patient has Hycodan on hand for cough. Ocuflox ophthalmic solution 2 drops in each eye 4 times a day for 5 days.

## 2016-08-20 ENCOUNTER — Encounter: Payer: Self-pay | Admitting: Cardiology

## 2016-08-23 ENCOUNTER — Telehealth: Payer: Self-pay | Admitting: Internal Medicine

## 2016-08-23 ENCOUNTER — Encounter: Payer: Self-pay | Admitting: Internal Medicine

## 2016-08-23 ENCOUNTER — Ambulatory Visit (INDEPENDENT_AMBULATORY_CARE_PROVIDER_SITE_OTHER): Payer: Medicare Other | Admitting: Internal Medicine

## 2016-08-23 ENCOUNTER — Ambulatory Visit
Admission: RE | Admit: 2016-08-23 | Discharge: 2016-08-23 | Disposition: A | Discharge status: Home / Self Care | Payer: Medicare Other | Source: Ambulatory Visit | Attending: Internal Medicine | Admitting: Internal Medicine

## 2016-08-23 VITALS — BP 118/62 | HR 106 | Temp 99.5°F | Ht 59.0 in | Wt 201.0 lb

## 2016-08-23 DIAGNOSIS — I10 Essential (primary) hypertension: Secondary | ICD-10-CM | POA: Diagnosis not present

## 2016-08-23 DIAGNOSIS — R059 Cough, unspecified: Secondary | ICD-10-CM

## 2016-08-23 DIAGNOSIS — J209 Acute bronchitis, unspecified: Secondary | ICD-10-CM | POA: Diagnosis not present

## 2016-08-23 DIAGNOSIS — I428 Other cardiomyopathies: Secondary | ICD-10-CM | POA: Diagnosis not present

## 2016-08-23 DIAGNOSIS — R05 Cough: Secondary | ICD-10-CM | POA: Diagnosis not present

## 2016-08-23 DIAGNOSIS — R0902 Hypoxemia: Secondary | ICD-10-CM | POA: Diagnosis not present

## 2016-08-23 MED ORDER — LEVOFLOXACIN 500 MG PO TABS: 500.0000 mg | ORAL_TABLET | Freq: Every day | ORAL | 0 refills | Status: DC

## 2016-08-23 MED ORDER — PREDNISONE 10 MG (21) PO TBPK: ORAL_TABLET | ORAL | 0 refills | Status: DC

## 2016-08-23 MED ORDER — CEFTRIAXONE SODIUM 1 G IJ SOLR
1.0000 g | Freq: Once | INTRAMUSCULAR | Status: AC
  Administered 2016-08-23: 1 g via INTRAMUSCULAR

## 2016-08-23 NOTE — Telephone Encounter (Signed)
Per Dr Renold Genta, patient needs to be seen.

## 2016-08-23 NOTE — Telephone Encounter (Signed)
Spoke with patient to advise that Dr. Renold Genta wants to see her in the office today.  Provided appointment at 3:30 this afternoon, 12/11.  Patient confirmed.

## 2016-08-23 NOTE — Telephone Encounter (Signed)
-----   Message from Elby Showers, MD sent at 08/23/2016  4:55 PM EST ----- Please call pt. No pneumonia- stay at home and rest. Take meds.

## 2016-08-23 NOTE — Progress Notes (Signed)
   Subjective:    Patient ID: [name], female    DOB: Jan 17, 1946, 70 y.o.   MRN: [id number]  HPI She is back from trip to [place] on a bus with multiple individuals. She left sick with a respiratory infection and apparently has gotten worse. On December 1 was seen with a restaurant for infection and conjunctivitis of the right eye. Has persistent cough, malaise and fatigue. History of nonischemic cardiomyopathy. On December 1 was treated with a ten-day course of Levaquin and a six-day Sterapred 10 mg dosepak. Was given Ocuflox ophthalmic solution for conjunctivitis. It wasn't ideal that she took the bus trip but she had signed up for it and it was a long-standing dream for her to see [facility].  Chest x-ray done today shows no evidence of pneumonia  She is tachycardic in the office today. Low-grade temp 99.5 orally. O2 sat is 93%  Review of Systems     Objective:   Physical Exam Skin warm and dry. She is tachycardic. No acute distress but looks a bit tremulous. Neck is supple. No JVD. Scattered wheezing. No lower extremity edema. Pharynx slightly injected.       Assessment & Plan:  Acute bronchitis  Bronchospasm  Hypoxemia  Nonischemic cardiomyopathy  History of conjunctivitis treated with Ocuflox  Plan: Reorder Levaquin 500 mg daily for 10 days. Reordered six-day course of prednisone. Use albuterol inhaler. Rest and drink plenty of fluids. Call as symptoms worsen.

## 2016-08-23 NOTE — Telephone Encounter (Signed)
Informed patient of chest xray results, negative pneumonia.

## 2016-08-23 NOTE — Telephone Encounter (Signed)
Patient states that she still has a terrible cough and still congested.  Feels that she needs another round of antibiotics and more cough medicine.  Do you want to see her in the office again?  Or, just repeat?    Pharmacy:  [facility] on [address]

## 2016-08-27 ENCOUNTER — Other Ambulatory Visit: Payer: Self-pay | Admitting: Internal Medicine

## 2016-09-11 NOTE — Patient Instructions (Signed)
Levaquin 500 milligrams daily for 10 days. Sterapred DS 10 mg 6 day Dosepak. Take Hycodan as needed for cough. Ocuflox ophthalmic solution 2 drops in each eye 4 times a day for 5 days.

## 2016-09-12 ENCOUNTER — Encounter: Payer: Self-pay | Admitting: Internal Medicine

## 2016-09-12 NOTE — Patient Instructions (Signed)
Levaquin 500 milligrams daily for 10 days. Sterapred DS 10 mg 6 day dosepak. Albuterol inhaler 2 sprays 4 times daily. Rest and drink plenty of fluids. Chest x-ray shows no pneumonia. Call if hypoxemia or other symptoms worsen.

## 2016-09-20 ENCOUNTER — Ambulatory Visit (INDEPENDENT_AMBULATORY_CARE_PROVIDER_SITE_OTHER): Payer: Medicare Other | Admitting: Cardiovascular Disease

## 2016-09-20 ENCOUNTER — Encounter: Payer: Self-pay | Admitting: Cardiovascular Disease

## 2016-09-20 VITALS — BP 90/70 | HR 75 | Ht 59.0 in | Wt 200.8 lb

## 2016-09-20 DIAGNOSIS — I5022 Chronic systolic (congestive) heart failure: Secondary | ICD-10-CM

## 2016-09-20 NOTE — Patient Instructions (Signed)
Medication Instructions:  Your physician recommends that you continue on your current medications as directed. Please refer to the Current Medication list given to you today.   Labwork: None Ordered   Testing/Procedures: None Ordered   Follow-Up: Your physician wants you to follow-up in: 6 months with Dr. Nahser.  You will receive a reminder letter in the mail two months in advance. If you don't receive a letter, please call our office to schedule the follow-up appointment.   If you need a refill on your cardiac medications before your next appointment, please call your pharmacy.   Thank you for choosing [facility]! Mcgwire Dasaro, RN [phone]    

## 2016-09-20 NOTE — Progress Notes (Signed)
Cardiology Office Note   Date:  09/20/2016   ID:  [name], DOB June 28, 1946, MRN [id number]  PCP:  Elby Showers, MD  Cardiologist:  [name], MD   Chief Complaint  Patient presents with  . Follow-up    chronic systolic CHF   Problem List 1. Chronic systolic congestive heart failure 2. ICD 3. Hypothyroidism     [name] is a 71 y.o. female who presents today to follow up cardiomyopathy. She is doing very well. She is not having any chest pain or shortness of breath. She's had no syncope or presyncope. Her ICD is followed carefully by Dr. Lovena Le. I saw her last October, 2015. She had an echo shortly after that. Her EF was in the 30-35% range. This is slightly better than it had been. We have her on the maximum doses of medicines that she can tolerate.  December 09, 2015:  Doing well.    Former patient of Dr. Ron Parker.  No CP ,  Chronic DOE with activity .  No dyspnea with sitting or lying down .   Jan. 8, 2018:  Doing well.  Mild dyspnea when she exerts herself , no CP    Past Medical History:  Diagnosis Date  . Automatic implantable cardioverter-defibrillator in situ    Dr. Beckie Salts follows-next visit- [phone]  . Borderline diabetes   . Cataract   . Diabetes mellitus without complication (HCC)    Borderline  . Dyslipidemia   . Ejection fraction < 50%    EF 25-30%, September, 2010  . GERD (gastroesophageal reflux disease)   . Hypothyroidism   . ICD (implantable cardiac defibrillator) in place    10/2009; Dr. Lovena Le  . LBBB (left bundle branch block)    old  . Mitral regurgitation    Mild, echo, September, 2010  . Nonischemic cardiomyopathy ([place])    Normal coronary arteries, catheterization, September, 2009  . Overweight(278.02)   . Systolic CHF, chronic (HCC)    no recent problems    Past Surgical History:  Procedure Laterality Date  . CARDIAC CATHETERIZATION     '09 last  . [place]    . COLONOSCOPY N/A 11/29/2013   Procedure: COLONOSCOPY;  Surgeon: Jerene Bears, MD;  Location: [place];  Service: Gastroenterology;  Laterality: N/A;  . COLONOSCOPY N/A 02/25/2015   Procedure: COLONOSCOPY;  Surgeon: Jerene Bears, MD;  Location: [facility];  Service: Gastroenterology;  Laterality: N/A;  . EYE SURGERY Left    x 3-"droopy eyelid"  . heart catherization    . TONSILLECTOMY  1952    Patient Active Problem List   Diagnosis Date Noted  . Chronic systolic CHF (congestive heart failure) ([place])     Priority: High  . History of colonic polyps   . Benign neoplasm of descending colon   . Diverticulosis of colon without hemorrhage 01/28/2014  . Serrated adenoma of colon 01/28/2014  . Nonspecific abnormal finding in stool contents 11/29/2013  . Colon cancer screening 11/29/2013  . History of depression 08/11/2012  . Systolic CHF, chronic ([place])   . LBBB (left bundle branch block)   . Automatic implantable cardioverter-defibrillator in situ   . Hypothyroidism   . GERD (gastroesophageal reflux disease)   . Dyslipidemia   . Type 2 diabetes mellitus ([place])   . Mitral regurgitation   . Overweight(278.02)   . Ejection fraction < 50%       Current Outpatient Prescriptions  Medication Sig Dispense Refill  . albuterol (PROVENTIL  HFA;VENTOLIN HFA) 108 (90 BASE) MCG/ACT inhaler Inhale 2 puffs into the lungs every 6 (six) hours as needed for wheezing or shortness of breath. 1 Inhaler prn  . aspirin 81 MG tablet Take 81 mg by mouth every morning.     . carvedilol (COREG) 25 MG tablet TAKE 1 TABLET BY MOUTH 2 TIMES DAILY WITH A MEAL. 60 tablet 9  . Cholecalciferol (VITAMIN D PO) Take 2 tablets by mouth every morning.     . citalopram (CELEXA) 40 MG tablet TAKE 1 TABLET (40 MG TOTAL) BY MOUTH EVERY EVENING. 90 tablet 1  . folic acid (FOLVITE) 1 MG tablet TAKE 1 TABLET (1 MG TOTAL) BY MOUTH DAILY. 90 tablet 3  . furosemide (LASIX) 40 MG tablet TAKE 1 TABLET BY MOUTH DAILY. 30 tablet 4  . glucose blood (ACCU-CHEK  AVIVA) test strip Check daily.  For Diabetes 250.00 100 each 12  . Lancets MISC Check glucose daily.  Diabetes 250.00 100 each 11  . levothyroxine (SYNTHROID, LEVOTHROID) 112 MCG tablet TAKE 1 TABLET (112 MCG TOTAL) BY MOUTH DAILY. 90 tablet 1  . metFORMIN (GLUCOPHAGE) 500 MG tablet Take 500 mg by mouth 2 (two) times daily with a meal.    . Multiple Vitamin (MULTIVITAMIN WITH MINERALS) TABS tablet Take 1 tablet by mouth every morning. chewable    . ofloxacin (OCUFLOX) 0.3 % ophthalmic solution Place 2 drops into both eyes 4 (four) times daily. 5 mL 1  . omeprazole (PRILOSEC) 20 MG capsule TAKE ONE CAPSULE BY MOUTH EVERY DAY 90 capsule 3  . ramipril (ALTACE) 2.5 MG capsule TAKE 1 CAPSULE BY MOUTH EVERY EVENING. 90 capsule 3  . spironolactone (ALDACTONE) 25 MG tablet TAKE 1 TABLET (25 MG TOTAL) BY MOUTH DAILY. 30 tablet 11   No current facility-administered medications for this visit.     Allergies:   Patient has no known allergies.    Social History:  The patient  reports that she has never smoked. She has never used smokeless tobacco. She reports that she drinks alcohol. She reports that she does not use drugs.   Family History:  The patient's family history includes Cancer in her father; Heart attack in her mother; Heart disease in her mother; Hypertension in her mother; Lung cancer in her father; Pancreatic cancer in her father; Stroke in her paternal grandfather.    ROS:  Please see the history of present illness.  Patient denies fever, chills, headache, sweats, rash, change in vision, change in hearing, chest pain, cough, nausea or vomiting, urinary symptoms. All other systems are reviewed and are negative.      PHYSICAL EXAM: VS:  BP 90/70 (BP Location: Left Arm, Patient Position: Sitting, Cuff Size: Large)   Pulse 75   Ht 4\' 11"  (1.499 m)   Wt 200 lb 12.8 oz (91.1 kg)   SpO2 94%   BMI 40.56 kg/m  , Patient is oriented to person time and place. Affect is normal. She is  overweight. She is stable. Head is atraumatic. Sclera and conjunctiva are normal. There is no jugular venous distention. Lungs are clear. Respiratory effort is not labored. Cardiac exam reveals an S1 and S2. There are no clicks or significant murmurs. The abdomen is soft. There is no peripheral edema. There are no musculoskeletal deformities. There are no skin rashes.    EKG:   EKG is done today and reviewed by me.  Jan. 8, 2018:   NSR, LBBB.    Recent Labs: 05/03/2016: ALT 9; BUN 19;  Creat 0.87; Hemoglobin 11.1; Platelets 352; Potassium 4.2; Sodium 141; TSH 0.93    Lipid Panel    Component Value Date/Time   CHOL 190 05/03/2016 0957   TRIG 77 05/03/2016 0957   HDL 68 05/03/2016 0957   CHOLHDL 2.8 05/03/2016 0957   VLDL 15 05/03/2016 0957   LDLCALC 107 05/03/2016 0957      Wt Readings from Last 3 Encounters:  09/20/16 200 lb 12.8 oz (91.1 kg)  08/23/16 201 lb (91.2 kg)  08/13/16 203 lb (92.1 kg)      Current medicines are reviewed  The patient understands her medications.     ASSESSMENT AND PLAN:  1. Chronic systolic congestive heart failure - continue current meds .   She has an EF of 30-35%.   Has an ICD.  She is on Coreg, altace, aldactone   2. ICD - plans per Dr. Lovena Le   3. Hypothyroidism - followed by her primary    [name], MD  09/20/2016 10:45 AM    [facility] [place],  [place], [place]  [address] Pager [phone] Phone: [phone]; Fax: [phone]

## 2016-09-23 LAB — CUP PACEART REMOTE DEVICE CHECK
Battery Remaining Longevity: 66 mo
Battery Remaining Percentage: 80 %
Brady Statistic RA Percent Paced: 0 %
Brady Statistic RV Percent Paced: 0 %
HighPow Impedance: 56 Ohm
Implantable Lead Location: 753859
Implantable Lead Model: 157
Implantable Lead Model: 5076
Lead Channel Impedance Value: 516 Ohm
Lead Channel Pacing Threshold Amplitude: 0.5 V
Lead Channel Pacing Threshold Amplitude: 0.5 V
Lead Channel Setting Pacing Amplitude: 2 V
Lead Channel Setting Pacing Amplitude: 2.4 V
Lead Channel Setting Pacing Pulse Width: 0.4 ms
MDC IDC LEAD IMPLANT DT: 20110221
MDC IDC LEAD IMPLANT DT: 20110221
MDC IDC LEAD LOCATION: 753860
MDC IDC LEAD SERIAL: 301957
MDC IDC MSMT LEADCHNL RA PACING THRESHOLD PULSEWIDTH: 0.4 ms
MDC IDC MSMT LEADCHNL RV IMPEDANCE VALUE: 630 Ohm
MDC IDC MSMT LEADCHNL RV PACING THRESHOLD PULSEWIDTH: 0.4 ms
MDC IDC PG IMPLANT DT: 20110221
MDC IDC PG SERIAL: 160860
MDC IDC SESS DTM: 20171129070300
MDC IDC SET LEADCHNL RV SENSING SENSITIVITY: 0.6 mV

## 2016-11-02 ENCOUNTER — Other Ambulatory Visit: Payer: Medicare Other | Admitting: Internal Medicine

## 2016-11-02 DIAGNOSIS — E039 Hypothyroidism, unspecified: Secondary | ICD-10-CM | POA: Diagnosis not present

## 2016-11-02 DIAGNOSIS — E119 Type 2 diabetes mellitus without complications: Secondary | ICD-10-CM

## 2016-11-02 DIAGNOSIS — E785 Hyperlipidemia, unspecified: Secondary | ICD-10-CM | POA: Diagnosis not present

## 2016-11-02 LAB — LIPID PANEL
CHOL/HDL RATIO: 3.6 ratio (ref <5.0)
Cholesterol: 172 mg/dL (ref <200)
HDL: 48 mg/dL — AB (ref >50)
LDL Cholesterol: 109 mg/dL — ABNORMAL HIGH (ref <100)
TRIGLYCERIDES: 77 mg/dL (ref <150)
VLDL: 15 mg/dL (ref <30)

## 2016-11-03 LAB — TSH: TSH: 0.2 mIU/L — ABNORMAL LOW

## 2016-11-03 LAB — HEMOGLOBIN A1C
Hgb A1c MFr Bld: 5.8 % — ABNORMAL HIGH (ref <5.7)
MEAN PLASMA GLUCOSE: 120 mg/dL

## 2016-11-04 ENCOUNTER — Encounter: Payer: Self-pay | Admitting: Internal Medicine

## 2016-11-04 ENCOUNTER — Ambulatory Visit (INDEPENDENT_AMBULATORY_CARE_PROVIDER_SITE_OTHER): Payer: Medicare Other | Admitting: Internal Medicine

## 2016-11-04 VITALS — BP 102/70 | HR 81 | Temp 98.5°F | Ht 59.0 in | Wt 198.0 lb

## 2016-11-04 DIAGNOSIS — E039 Hypothyroidism, unspecified: Secondary | ICD-10-CM | POA: Diagnosis not present

## 2016-11-04 DIAGNOSIS — R946 Abnormal results of thyroid function studies: Secondary | ICD-10-CM | POA: Diagnosis not present

## 2016-11-04 DIAGNOSIS — R7989 Other specified abnormal findings of blood chemistry: Secondary | ICD-10-CM

## 2016-11-04 DIAGNOSIS — I428 Other cardiomyopathies: Secondary | ICD-10-CM | POA: Diagnosis not present

## 2016-11-04 DIAGNOSIS — R7302 Impaired glucose tolerance (oral): Secondary | ICD-10-CM

## 2016-11-04 MED ORDER — FOLIC ACID 1 MG PO TABS: 1.0000 mg | ORAL_TABLET | Freq: Every day | ORAL | 3 refills | Status: DC

## 2016-11-04 MED ORDER — LEVOTHYROXINE SODIUM 100 MCG PO CAPS: 100.0000 ug | ORAL_CAPSULE | Freq: Every day | ORAL | 2 refills | Status: DC

## 2016-11-04 NOTE — Patient Instructions (Addendum)
Thyroid replacement medication dose will be decreased. Please follow-up here in 3 months with TSH. Return in 6 months for physical examination. Continue folate supplement for history of low-normal folate level. Congratulations on joining [employer].

## 2016-11-04 NOTE — Progress Notes (Signed)
   Subjective:    Patient ID: [name], female    DOB: 03-12-1946, 71 y.o.   MRN: [id number]  HPI 71 year old Female with impaired glucose tolerance and hypothyroidism.  Her hemoglobin A1c is good at 5.8% and 6 months ago was 5.9%.  She recently joined [employer].  Hx low normal folate- continue supplement.  Her TSH is low. She is currently on levothyroxine 0.112 mg daily. Were going to decrease this to 0.1 mg daily and have her return for TSH only in 3 months. Her physical exam will be due in 6 months.  Blood pressure stable on current regimen. She has a history of nonischemic cardiomyopathy.    Review of Systems no new complaints     Objective:   Physical Exam Skin warm and dry. Nodes none. No thyromegaly. Chest clear. Cardiac exam regular rate and rhythm normal S1 and S2. Extremity without edema       Assessment & Plan:  Nonischemic cardiomyopathy  Impaired glucose tolerance  Low TSH  Hypothyroidism  Obesity  Plan: Congratulations on joining [employer]. Follow-up in 3 months with repeat TSH on levothyroxine 0.1 mg daily. Doses been adjusted. Will need 6 month appointment for physical examination. Continue folate supplement. Watch diet and exercise. Handicap parking permit signed at patient request

## 2016-11-10 ENCOUNTER — Ambulatory Visit (INDEPENDENT_AMBULATORY_CARE_PROVIDER_SITE_OTHER): Payer: Medicare Other | Admitting: *Deleted

## 2016-11-10 DIAGNOSIS — I428 Other cardiomyopathies: Secondary | ICD-10-CM | POA: Diagnosis not present

## 2016-11-10 NOTE — Progress Notes (Signed)
Remote ICD transmission.   

## 2016-11-11 ENCOUNTER — Encounter: Payer: Self-pay | Admitting: Cardiology

## 2016-11-16 LAB — CUP PACEART REMOTE DEVICE CHECK
Battery Remaining Longevity: 66 mo
Battery Remaining Percentage: 76 %
Brady Statistic RA Percent Paced: 0 %
HIGH POWER IMPEDANCE MEASURED VALUE: 55 Ohm
Implantable Lead Implant Date: 20110221
Implantable Lead Location: 753860
Implantable Lead Model: 5076
Implantable Lead Serial Number: 301957
Implantable Pulse Generator Implant Date: 20110221
Lead Channel Impedance Value: 507 Ohm
Lead Channel Impedance Value: 612 Ohm
Lead Channel Pacing Threshold Pulse Width: 0.4 ms
Lead Channel Pacing Threshold Pulse Width: 0.4 ms
Lead Channel Setting Pacing Amplitude: 2.4 V
Lead Channel Setting Pacing Pulse Width: 0.4 ms
Lead Channel Setting Sensing Sensitivity: 0.6 mV
MDC IDC LEAD IMPLANT DT: 20110221
MDC IDC LEAD LOCATION: 753859
MDC IDC MSMT LEADCHNL RA PACING THRESHOLD AMPLITUDE: 0.5 V
MDC IDC MSMT LEADCHNL RV PACING THRESHOLD AMPLITUDE: 0.5 V
MDC IDC SESS DTM: 20180228072900
MDC IDC SET LEADCHNL RA PACING AMPLITUDE: 2 V
MDC IDC STAT BRADY RV PERCENT PACED: 0 %
Pulse Gen Serial Number: 160860

## 2016-11-23 ENCOUNTER — Ambulatory Visit
Admission: RE | Admit: 2016-11-23 | Discharge: 2016-11-23 | Disposition: A | Discharge status: Home / Self Care | Payer: Medicare Other | Source: Ambulatory Visit | Attending: Internal Medicine | Admitting: Internal Medicine

## 2016-11-23 ENCOUNTER — Ambulatory Visit (INDEPENDENT_AMBULATORY_CARE_PROVIDER_SITE_OTHER): Payer: Medicare Other | Admitting: Internal Medicine

## 2016-11-23 ENCOUNTER — Encounter: Payer: Self-pay | Admitting: Internal Medicine

## 2016-11-23 ENCOUNTER — Other Ambulatory Visit: Payer: Self-pay | Admitting: Internal Medicine

## 2016-11-23 VITALS — BP 100/60 | HR 92 | Temp 98.6°F | Wt 190.0 lb

## 2016-11-23 DIAGNOSIS — R062 Wheezing: Secondary | ICD-10-CM

## 2016-11-23 DIAGNOSIS — J22 Unspecified acute lower respiratory infection: Secondary | ICD-10-CM

## 2016-11-23 DIAGNOSIS — R05 Cough: Secondary | ICD-10-CM | POA: Diagnosis not present

## 2016-11-23 DIAGNOSIS — R0602 Shortness of breath: Secondary | ICD-10-CM | POA: Diagnosis not present

## 2016-11-23 DIAGNOSIS — R718 Other abnormality of red blood cells: Secondary | ICD-10-CM | POA: Diagnosis not present

## 2016-11-23 DIAGNOSIS — J9801 Acute bronchospasm: Secondary | ICD-10-CM | POA: Diagnosis not present

## 2016-11-23 DIAGNOSIS — R079 Chest pain, unspecified: Secondary | ICD-10-CM | POA: Diagnosis not present

## 2016-11-23 LAB — CBC WITH DIFFERENTIAL/PLATELET
BASOS PCT: 1 %
Basophils Absolute: 82 cells/uL (ref 0–200)
Eosinophils Absolute: 328 cells/uL (ref 15–500)
Eosinophils Relative: 4 %
HEMATOCRIT: 34.5 % — AB (ref 35.0–45.0)
Hemoglobin: 11.4 g/dL — ABNORMAL LOW (ref 11.7–15.5)
LYMPHS ABS: 1804 {cells}/uL (ref 850–3900)
Lymphocytes Relative: 22 %
MCH: 33.8 pg — ABNORMAL HIGH (ref 27.0–33.0)
MCHC: 33 g/dL (ref 32.0–36.0)
MCV: 102.4 fL — AB (ref 80.0–100.0)
MONO ABS: 738 {cells}/uL (ref 200–950)
MONOS PCT: 9 %
MPV: 10 fL (ref 7.5–12.5)
NEUTROS ABS: 5248 {cells}/uL (ref 1500–7800)
Neutrophils Relative %: 64 %
PLATELETS: 363 10*3/uL (ref 140–400)
RBC: 3.37 MIL/uL — ABNORMAL LOW (ref 3.80–5.10)
RDW: 16.4 % — ABNORMAL HIGH (ref 11.0–15.0)
WBC: 8.2 10*3/uL (ref 3.8–10.8)

## 2016-11-23 MED ORDER — ALBUTEROL SULFATE HFA 108 (90 BASE) MCG/ACT IN AERS: 2.0000 | INHALATION_SPRAY | Freq: Four times a day (QID) | RESPIRATORY_TRACT | 99 refills | Status: AC | PRN

## 2016-11-23 MED ORDER — IPRATROPIUM-ALBUTEROL 0.5-2.5 (3) MG/3ML IN SOLN: 3.0000 mL | RESPIRATORY_TRACT | 99 refills | Status: DC | PRN

## 2016-11-23 MED ORDER — LEVOFLOXACIN 500 MG PO TABS: 500.0000 mg | ORAL_TABLET | Freq: Every day | ORAL | 0 refills | Status: DC

## 2016-11-23 MED ORDER — CEFTRIAXONE SODIUM 1 G IJ SOLR
1.0000 g | Freq: Once | INTRAMUSCULAR | Status: AC
  Administered 2016-11-23: 1 g via INTRAMUSCULAR

## 2016-11-23 MED ORDER — PREDNISONE 10 MG PO TABS: ORAL_TABLET | ORAL | 0 refills | Status: DC

## 2016-11-23 MED ORDER — FLUTICASONE-SALMETEROL 250-50 MCG/DOSE IN AEPB: 1.0000 | INHALATION_SPRAY | Freq: Two times a day (BID) | RESPIRATORY_TRACT | 3 refills | Status: DC

## 2016-11-23 MED ORDER — IPRATROPIUM-ALBUTEROL 0.5-2.5 (3) MG/3ML IN SOLN
3.0000 mL | Freq: Once | RESPIRATORY_TRACT | Status: AC
  Administered 2016-11-23: 3 mL via RESPIRATORY_TRACT

## 2016-11-23 NOTE — Patient Instructions (Signed)
Rest and drink plenty of fluids. Use home nebulizer up to 4 times daily if needed. Use albuterol and Advair inhalers as directed. Take prednisone in tapering course over 12 days. Levaquin 500 milligrams daily for 10 days.

## 2016-11-23 NOTE — Progress Notes (Addendum)
   Subjective:    Patient ID: [name], female    DOB: 06/28/1946, 71 y.o.   MRN: [id number]  HPI  71 year old Female with onset URI symptoms last Thursday. Cough has gotten worse with discolored sputum. Has had chills but no documented fever. No myalgias. Tired and has been SOB last evening.Used inhaler before going to bed.  Pulse ox low at 92% and was 96 % in August. Pulse ox was 94% and 93%  in December when had respiratory infection. Apparently has no inhalers at home. Has previously been prescribed albuterol inhaler. Did have flu vaccine. Very fatigued  Symptoms started a scratchy throat.      Review of Systems see above. No chest pain or diaphoresis. Hx of non-ischemic cardiomyopathy.     Objective:   Physical Exam Skin warm and dry. Nodes none. Pharynx and TMs are clear. Neck is supple without JVD thyromegaly or carotid bruits. Chest bilateral rhonchi and wheezing that do not clear with coughing. Cardiac exam regular rate and rhythm normal S1 and S2. Extremities without edema.  Given DuoNeb nebulizer treatment and office  Chest x-ray shows no evidence of pneumonia      Assessment & Plan:  Asthmatic bronchitis  Acute lower respiratory infection  Nonischemic cardiomyopathy  Plan: Rocephin IM given in office. Levaquin 500 milligrams daily for 10 days. Albuterol inhaler 2 sprays by mouth 4 times daily. Advair 250/50 one spray by mouth every 12 hours. Sterapred DS 10 mg 12 day dosepak take in tapering course as directed.  Suggest pulmonary consultation. Order given for home nebulizer and DuoNeb solution.  This is a third respiratory infection she's had since early December. Rest and drink plenty of fluids.  25 minutes spent with patient  Addendum: Patient only got approximately 500 mg IM Rocephin due to defect in syringe.

## 2016-11-24 ENCOUNTER — Other Ambulatory Visit: Payer: Self-pay | Admitting: Internal Medicine

## 2016-11-24 DIAGNOSIS — R718 Other abnormality of red blood cells: Secondary | ICD-10-CM

## 2016-11-24 DIAGNOSIS — D7589 Other specified diseases of blood and blood-forming organs: Secondary | ICD-10-CM

## 2016-11-24 LAB — VITAMIN B12: Vitamin B-12: 1648 pg/mL — ABNORMAL HIGH (ref 200–1100)

## 2016-11-24 LAB — FOLATE: FOLATE: 19.8 ng/mL (ref >5.4)

## 2016-11-24 LAB — BRAIN NATRIURETIC PEPTIDE: Brain Natriuretic Peptide: 237.6 pg/mL — ABNORMAL HIGH (ref <100)

## 2016-11-26 ENCOUNTER — Telehealth: Payer: Self-pay | Admitting: Internal Medicine

## 2016-11-26 ENCOUNTER — Other Ambulatory Visit: Payer: Self-pay

## 2016-11-26 DIAGNOSIS — J45901 Unspecified asthma with (acute) exacerbation: Secondary | ICD-10-CM

## 2016-11-26 DIAGNOSIS — R062 Wheezing: Secondary | ICD-10-CM

## 2016-11-26 MED ORDER — IPRATROPIUM-ALBUTEROL 0.5-2.5 (3) MG/3ML IN SOLN: 3.0000 mL | RESPIRATORY_TRACT | 99 refills | Status: DC | PRN

## 2016-11-26 NOTE — Telephone Encounter (Signed)
E prescribed home nebulizer machine to [facility]. Diagnosis is asthmatic bronchitis

## 2016-11-28 ENCOUNTER — Other Ambulatory Visit: Payer: Self-pay | Admitting: Cardiology

## 2016-11-28 ENCOUNTER — Other Ambulatory Visit: Payer: Self-pay | Admitting: Cardiovascular Disease

## 2016-11-29 ENCOUNTER — Other Ambulatory Visit: Payer: Self-pay

## 2016-11-29 DIAGNOSIS — J45909 Unspecified asthma, uncomplicated: Secondary | ICD-10-CM

## 2016-11-29 DIAGNOSIS — R062 Wheezing: Secondary | ICD-10-CM

## 2016-11-29 MED ORDER — IPRATROPIUM-ALBUTEROL 0.5-2.5 (3) MG/3ML IN SOLN: 3.0000 mL | RESPIRATORY_TRACT | 99 refills | Status: DC | PRN

## 2016-12-03 ENCOUNTER — Other Ambulatory Visit: Payer: Self-pay | Admitting: Cardiovascular Disease

## 2016-12-03 MED ORDER — SPIRONOLACTONE 25 MG PO TABS: ORAL_TABLET | ORAL | 8 refills | Status: DC

## 2016-12-07 ENCOUNTER — Other Ambulatory Visit: Payer: Self-pay

## 2016-12-07 MED ORDER — SPIRONOLACTONE 25 MG PO TABS: ORAL_TABLET | ORAL | 1 refills | Status: DC

## 2016-12-07 NOTE — Telephone Encounter (Signed)
Patient had requested 90 day supply instead of 30 day supply of spironolactone 25 mg.

## 2016-12-30 ENCOUNTER — Other Ambulatory Visit: Payer: Self-pay | Admitting: Internal Medicine

## 2016-12-31 ENCOUNTER — Other Ambulatory Visit (INDEPENDENT_AMBULATORY_CARE_PROVIDER_SITE_OTHER): Payer: Medicare Other

## 2016-12-31 ENCOUNTER — Ambulatory Visit (INDEPENDENT_AMBULATORY_CARE_PROVIDER_SITE_OTHER): Payer: Medicare Other | Admitting: Internal Medicine

## 2016-12-31 ENCOUNTER — Encounter: Payer: Self-pay | Admitting: Internal Medicine

## 2016-12-31 VITALS — BP 118/74 | HR 77 | Ht 59.0 in | Wt 184.0 lb

## 2016-12-31 DIAGNOSIS — I10 Essential (primary) hypertension: Secondary | ICD-10-CM

## 2016-12-31 DIAGNOSIS — J45991 Cough variant asthma: Secondary | ICD-10-CM | POA: Diagnosis not present

## 2016-12-31 LAB — CBC WITH DIFFERENTIAL/PLATELET
BASOS PCT: 2.2 % (ref 0.0–3.0)
Basophils Absolute: 0.1 10*3/uL (ref 0.0–0.1)
EOS ABS: 0.1 10*3/uL (ref 0.0–0.7)
EOS PCT: 1.6 % (ref 0.0–5.0)
HEMATOCRIT: 35.9 % — AB (ref 36.0–46.0)
HEMOGLOBIN: 12.1 g/dL (ref 12.0–15.0)
LYMPHS PCT: 46.2 % — AB (ref 12.0–46.0)
Lymphs Abs: 2.4 10*3/uL (ref 0.7–4.0)
MCHC: 33.6 g/dL (ref 30.0–36.0)
MCV: 103.2 fl — AB (ref 78.0–100.0)
MONOS PCT: 10.1 % (ref 3.0–12.0)
Monocytes Absolute: 0.5 10*3/uL (ref 0.1–1.0)
Neutro Abs: 2.1 10*3/uL (ref 1.4–7.7)
Neutrophils Relative %: 39.9 % — ABNORMAL LOW (ref 43.0–77.0)
Platelets: 377 10*3/uL (ref 150.0–400.0)
RBC: 3.48 Mil/uL — ABNORMAL LOW (ref 3.87–5.11)
RDW: 15.6 % — AB (ref 11.5–15.5)
WBC: 5.2 10*3/uL (ref 4.0–10.5)

## 2016-12-31 LAB — BASIC METABOLIC PANEL
BUN: 19 mg/dL (ref 6–23)
CO2: 35 mEq/L — ABNORMAL HIGH (ref 19–32)
Calcium: 10.1 mg/dL (ref 8.4–10.5)
Chloride: 100 mEq/L (ref 96–112)
Creatinine, Ser: 0.94 mg/dL (ref 0.40–1.20)
GFR: 62.47 mL/min (ref >60.00)
GLUCOSE: 117 mg/dL — AB (ref 70–99)
POTASSIUM: 4.6 meq/L (ref 3.5–5.1)
Sodium: 142 mEq/L (ref 135–145)

## 2016-12-31 LAB — NITRIC OXIDE: Nitric Oxide: 26

## 2016-12-31 MED ORDER — VALSARTAN 160 MG PO TABS: 160.0000 mg | ORAL_TABLET | Freq: Every day | ORAL | 11 refills | Status: DC

## 2016-12-31 NOTE — Progress Notes (Signed)
Subjective:     Patient ID: [name], female   DOB: 05/18/1946,    MRN: [id number]  HPI   84 yowf never smoker with rhinitis dating back to childhood year round but worse fall assoc with freq "chest colds" requiring short term inhalers only but gradually worse severity/ frequency "colds"  so referred to pulmonary clinic 12/31/2016 by Dr   [name]   12/31/2016 1st [facility] office visit/ [name]  On ACEi and Coreg/ no maint inhalers  Chief Complaint  Patient presents with  . Pulm Consult    Referred by Dr. Renold [name] for asthmatic bronchitis. Has a story history of developing bronchitis over the past few years.    last flare cough /sob  was Mid March 2018 rx pred/abx and all better by December 12 2016  And since then but concerned  Becoming much more frequent severe flares so rec advair but afraid to take it due to interaction with coreg so never filled it.  Not limited by breathing from desired activities  Between episodes   No obvious day to day or daytime variability or assoc excess/ purulent sputum or mucus plugs or hemoptysis or cp or chest tightness, subjective wheeze or overt sinus or hb symptoms. No unusual exp hx or h/o childhood pna/ asthma or knowledge of premature birth.  Sleeping ok without nocturnal  or early am exacerbation  of respiratory  c/o's or need for noct saba. Also denies any obvious fluctuation of symptoms with weather or environmental changes or other aggravating or alleviating factors except as outlined above   Current Medications, Allergies, Complete Past Medical History, Past Surgical History, Family History, and Social History were reviewed in [employer] record.  ROS  The following are not active complaints unless bolded sore throat, dysphagia, dental problems, itching, sneezing,  nasal congestion or excess/ purulent secretions, ear ache,   fever, chills, sweats, unintended wt loss, classically pleuritic or exertional cp,  orthopnea pnd  or leg swelling, presyncope, palpitations, abdominal pain, anorexia, nausea, vomiting, diarrhea  or change in bowel or bladder habits, change in stools or urine, dysuria,hematuria,  rash, arthralgias, visual complaints, headache, numbness, weakness or ataxia or problems with walking or coordination,  change in mood/affect or memory.           Review of Systems     Objective:   Physical Exam   Obese very pleasant amb wf nad     Wt Readings from Last 3 Encounters:  12/31/16 184 lb (83.5 kg)  11/23/16 190 lb (86.2 kg)  11/04/16 198 lb (89.8 kg)    Vital signs reviewed  - Note on arrival 02 sats 94% on RA and bp 118/74      HEENT: nl dentition, turbinates bilaterally, and oropharynx. Nl external ear canals without cough reflex   NECK :  without JVD/Nodes/TM/ nl carotid upstrokes bilaterally   LUNGS: no acc muscle use,  Nl contour chest which is clear to A and P bilaterally with minimal pseudowheezing   CV:  RRR  no s3 or murmur or increase in P2, and no edema   ABD:  Obese soft and nontender with nl inspiratory excursion in the supine position. No bruits or organomegaly appreciated, bowel sounds nl  MS:  Nl gait/ ext warm without deformities, calf tenderness, cyanosis or clubbing No obvious joint restrictions   SKIN: warm and dry without lesions    NEURO:  alert, approp, nl sensorium with  no motor or cerebellar deficits apparent.  I personally reviewed images and agree with radiology impression as follows:  CXR:   11/23/16 The lungs are well-expanded. The interstitial markings are coarse though stable. The heart is top-normal in size. The central pulmonary vascularity is prominent but stable. The ICD is in stable position. There is no pleural effusion. There is mild multilevel degenerative disc disease of the thoracic spine.    Assessment:

## 2016-12-31 NOTE — Patient Instructions (Addendum)
Please remember to go to the lab  department downstairs in the basement  for your tests - we will call you with the results when they are available.  Stop altace and start diovan (valsartan) 160 mg daily in its place - use one half daily if develop light headed standing  In the event that you have another flare especially if coughing , I recommend take prilosec 20 mg Take 30- 60 min before your first and last meals of the day until better    Please schedule a follow up office visit in 6 weeks, call sooner if needed

## 2016-12-31 NOTE — Assessment & Plan Note (Addendum)
Spirometry 12/31/2016  Only abn = truncation of early portion f/v loop  - FENO 12/31/2016  =   26  - Allergy profile 12/31/2016 >  Eos 0. /  IgE    - 12/31/16 rec try off acei    DDX of  difficult airways management almost all start with A and  include Adherence, Ace Inhibitors, Acid Reflux, Active Sinus Disease, Alpha 1 Antitripsin deficiency, Anxiety masquerading as Airways dz,  ABPA,  Allergy(esp in young), Aspiration (esp in elderly), Adverse effects of meds,  Active smokers, A bunch of PE's (a small clot burden can't cause this syndrome unless there is already severe underlying pulm or vascular dz with poor reserve) plus two Bs  = Bronchiectasis and Beta blocker use..and one C= CHF  Adherence is always the initial "prime suspect" and is a multilayered concern that requires a "trust but verify" approach in every patient - starting with knowing how to use medications, especially inhalers, correctly, keeping up with refills and understanding the fundamental difference between maintenance and prns vs those medications only taken for a very short course and then stopped and not refilled.  - prefers no maint rx and that's reasonable for now  ACEi at top of the usual list of suspects (see separate a/p)   ? gerd. Of the three most common causes of  Sub-acute or recurrent or chronic cough, only one (GERD)  can actually contribute to/ trigger  the other two (asthma and post nasal drip syndrome)  and perpetuate the cylce of cough.  While not intuitively obvious, many patients with chronic low grade reflux do not cough until there is a primary insult that disturbs the protective epithelial barrier and exposes sensitive nerve endings.   This is typically viral but can be direct physical injury such as with an endotracheal tube.   The point is that once this occurs, it is difficult to eliminate the cycle  using anything but a maximally effective acid suppression regimen at least in the short run, accompanied by  an appropriate diet to address non acid GERD and control / eliminate the cough itself for at least 3 days.  rec double ppi to take bid ac for any flares   ? Allergy > check profile / consider adding singulair next rather than ics which can aggravate the uacs component   ? Active sinus dz > doubt clinically but if remains refractory needs sinus CT  ? Adverse effect of meds, esp advair dpi > leave off and just use saba prn   ? BB > see hbp a/p  ? chf > last bnp indeterminate range but no orthopnea/ leg swelling on present rx    Total time devoted to counseling  > 50 % of initial 60 min office visit:  review case with pt/ discussion of options/alternatives/ personally creating written customized instructions  in presence of pt  then going over those specific  Instructions directly with the pt including how to use all of the meds but in particular covering each new medication in detail and the difference between the maintenance= "automatic" meds and the prns using an action plan format for the latter (If this problem/symptom => do that organization reading Left to right).  Please see AVS from this visit for a full list of these instructions which I personally wrote for this pt and  are unique to this visit.

## 2016-12-31 NOTE — Assessment & Plan Note (Signed)
Body mass index is 37.16 kg/m.  -  trending down, encouraged Lab Results  Component Value Date   TSH 0.20 (L) 11/02/2016     Contributing to gerd risk/ doe/reviewed the need and the process to achieve and maintain neg calorie balance > defer f/u primary care including intermittently monitoring thyroid status

## 2016-12-31 NOTE — Assessment & Plan Note (Signed)
In the best review of chronic cough to date ( NEJM 2016 375 [phone]) ,  ACEi are now felt to cause cough in up to  20% of pts which is a 4 fold increase from previous reports and does not include the variety of non-specific complaints we see in pulmonary clinic in pts on ACEi but previously attributed to another dx like  Copd/asthma and  include PNDS, throat and chest congestion, "bronchitis", unexplained dyspnea and noct "strangling" sensations, and hoarseness, but also  atypical /refractory GERD symptoms like dysphagia and "bad heartburn"   The only way I know  to prove this is not an "ACEi Case" is a trial off ACEi x a minimum of 6 weeks then regroup.   Try diovan 160 mg daily

## 2017-01-03 LAB — RESPIRATORY ALLERGY PROFILE REGION II ~~LOC~~
Allergen, A. alternata, m6: <0.1 kU/L
Allergen, Cedar tree, t12: <0.1 kU/L
Allergen, Cottonwood, t14: <0.1 kU/L
Allergen, D pternoyssinus,d7: <0.1 kU/L
Allergen, Mouse Urine Protein, e78: <0.1 kU/L
Allergen, Oak,t7: <0.1 kU/L
Allergen, P. notatum, m1: <0.1 kU/L
Bermuda Grass: <0.1 kU/L
Cat Dander: <0.1 kU/L
D. farinae: <0.1 kU/L
Dog Dander: <0.1 kU/L
Elm IgE: <0.1 kU/L
IGE (IMMUNOGLOBULIN E), SERUM: 183 kU/L — AB (ref <115)
Johnson Grass: <0.1 kU/L
Pecan/Hickory Tree IgE: <0.1 kU/L
Rough Pigweed  IgE: <0.1 kU/L
Timothy Grass: <0.1 kU/L

## 2017-01-03 NOTE — Progress Notes (Signed)
ATC, NA and no option to leave msg 

## 2017-01-05 ENCOUNTER — Telehealth: Payer: Self-pay | Admitting: Internal Medicine

## 2017-01-05 NOTE — Telephone Encounter (Signed)
Spoke with pt and informed her of her blood work per [employer]. She understood and had no further questions. Nothing further is needed.    Notes recorded by Tanda Rockers, MD on 01/03/2017 at 4:37 PM EDT Call patient : Studies are unremarkable, no change in recs

## 2017-01-11 ENCOUNTER — Other Ambulatory Visit: Payer: Medicare Other | Admitting: Internal Medicine

## 2017-01-24 ENCOUNTER — Other Ambulatory Visit: Payer: Self-pay | Admitting: Cardiovascular Disease

## 2017-01-24 MED ORDER — SPIRONOLACTONE 25 MG PO TABS: ORAL_TABLET | ORAL | 1 refills | Status: DC

## 2017-02-11 ENCOUNTER — Ambulatory Visit (INDEPENDENT_AMBULATORY_CARE_PROVIDER_SITE_OTHER): Payer: Medicare Other | Admitting: Internal Medicine

## 2017-02-11 ENCOUNTER — Encounter: Payer: Self-pay | Admitting: Internal Medicine

## 2017-02-11 ENCOUNTER — Other Ambulatory Visit (INDEPENDENT_AMBULATORY_CARE_PROVIDER_SITE_OTHER): Payer: Medicare Other

## 2017-02-11 VITALS — BP 108/64 | HR 84 | Ht 59.75 in | Wt 188.0 lb

## 2017-02-11 DIAGNOSIS — J45991 Cough variant asthma: Secondary | ICD-10-CM | POA: Diagnosis not present

## 2017-02-11 DIAGNOSIS — I10 Essential (primary) hypertension: Secondary | ICD-10-CM

## 2017-02-11 LAB — BASIC METABOLIC PANEL
BUN: 34 mg/dL — AB (ref 6–23)
CHLORIDE: 101 meq/L (ref 96–112)
CO2: 32 meq/L (ref 19–32)
Calcium: 9.8 mg/dL (ref 8.4–10.5)
Creatinine, Ser: 1.45 mg/dL — ABNORMAL HIGH (ref 0.40–1.20)
GFR: 37.87 mL/min — ABNORMAL LOW (ref >60.00)
GLUCOSE: 111 mg/dL — AB (ref 70–99)
POTASSIUM: 4.2 meq/L (ref 3.5–5.1)
SODIUM: 140 meq/L (ref 135–145)

## 2017-02-11 MED ORDER — VALSARTAN 80 MG PO TABS: 80.0000 mg | ORAL_TABLET | Freq: Every day | ORAL | 11 refills | Status: DC

## 2017-02-11 NOTE — Progress Notes (Signed)
Subjective:     Patient ID: [name], female   DOB: [birth year],    MRN: [id number]     Brief patient profile:  79 yowf never smoker with rhinitis dating back to childhood year round but worse fall assoc with freq "chest colds" requiring short term inhalers only but gradually worse severity/ frequency "colds"  so referred to pulmonary clinic 12/31/2016 by Dr   [name]    History of Present Illness  12/31/2016 1st [name] visit/ [name]  On ACEi and Coreg/ no maint inhalers  Chief Complaint  Patient presents with  . Pulm Consult    Referred by Dr. Renold [name] for asthmatic bronchitis. Has a story history of developing bronchitis over the past few years.    last flare cough /sob  was Mid March 2018 rx pred/abx and all better by December 12 2016  And since then but concerned  Becoming much more frequent severe flares so rec advair but afraid to take it due to interaction with coreg so never filled it. Not limited by breathing from desired activities  Between episodes rec Please remember to go to the lab  department downstairs in the basement  for your tests - we will call you with the results when they are available. Stop altace and start diovan (valsartan) 160 mg daily in its place - use one half daily if develop light headed standing  In the event that you have another flare especially if coughing , I recommend take prilosec 20 mg Take 30- 60 min before your first and last meals of the day until better    02/11/2017  f/u ov/[name] re:  Asthma vs uacs no flare off acei  Chief Complaint  Patient presents with  . Follow-up    [name] is doing well and no co's today.    has not needed gerd rx  Now on coreg as well as diovan 106 mg one half daily /alactone and lasix  Not limited by breathing from desired activities / no need for saba in any form    No obvious day to day or daytime variability or assoc excess/ purulent sputum or mucus plugs or hemoptysis or cp or chest  tightness, subjective wheeze or overt sinus or hb symptoms. No unusual exp hx or h/o childhood pna/ asthma or knowledge of premature birth.  Sleeping ok without nocturnal  or early am exacerbation  of respiratory  c/o's or need for noct saba. Also denies any obvious fluctuation of symptoms with weather or environmental changes or other aggravating or alleviating factors except as outlined above   Current Medications, Allergies, Complete Past Medical History, Past Surgical History, Family History, and Social History were reviewed in [employer] record.  ROS  The following are not active complaints unless bolded sore throat, dysphagia, dental problems, itching, sneezing,  nasal congestion or excess/ purulent secretions, ear ache,   fever, chills, sweats, unintended wt loss, classically pleuritic or exertional cp,  orthopnea pnd or leg swelling, presyncope, palpitations, abdominal pain, anorexia, nausea, vomiting, diarrhea  or change in bowel or bladder habits, change in stools or urine, dysuria,hematuria,  rash, arthralgias, visual complaints, headache, numbness, weakness or ataxia or problems with walking or coordination,  change in mood/affect or memory.               Objective:   Physical Exam   Obese very pleasant amb wf nad      02/11/2017        188  12/31/16 184 lb (83.5 kg)  11/23/16 190 lb (86.2 kg)  11/04/16 198 lb (89.8 kg)    Vital signs reviewed  - Note on arrival 02 sats 97% on RA and  BP  108/64    Pulse 84    HEENT: nl dentition, turbinates bilaterally, and oropharynx. Nl external ear canals without cough reflex   NECK :  without JVD/Nodes/TM/ nl carotid upstrokes bilaterally   LUNGS: no acc muscle use,  Nl contour chest which is clear to A and P bilaterally   CV:  RRR  no s3 or murmur or increase in P2, and no edema   ABD:  Obese soft and nontender with nl inspiratory excursion in the supine position. No bruits or organomegaly appreciated,  bowel sounds nl  MS:  Nl gait/ ext warm without deformities, calf tenderness, cyanosis or clubbing No obvious joint restrictions   SKIN: warm and dry without lesions    NEURO:  alert, approp, nl sensorium with  no motor or cerebellar deficits apparent.        I personally reviewed images and agree with radiology impression as follows:  CXR:   11/23/16 The lungs are well-expanded. The interstitial markings are coarse though stable. The heart is top-normal in size. The central pulmonary vascularity is prominent but stable. The ICD is in stable position. There is no pleural effusion. There is mild multilevel degenerative disc disease of the thoracic spine.  Labs ordered/ reviewed:      Chemistry      Component Value Date/Time   NA 140 02/11/2017 1436   K 4.2 02/11/2017 1436   CL 101 02/11/2017 1436   CO2 32 02/11/2017 1436   BUN 34 (H) 02/11/2017 1436   CREATININE 1.45 (H) 02/11/2017 1436   CREATININE 0.87 05/03/2016 0957      Component Value Date/Time   CALCIUM 9.8 02/11/2017 1436   ALKPHOS 62 05/03/2016 0957   AST 11 05/03/2016 0957   ALT 9 05/03/2016 0957   BILITOT 0.7 05/03/2016 0957             Assessment:

## 2017-02-11 NOTE — Progress Notes (Signed)
LMTCB 6/1

## 2017-02-11 NOTE — Progress Notes (Signed)
Pt returned call. Informed her of the results and recs per MW. Pt verbalized understanding and states Dr. Katharina Caper is the provider filling her Lasix and aldactone and already has an appt with him. Advised pt to be sure to keep that appt and to f/u with him. Pt verbalized understanding and denied any further questions or concerns at this time.

## 2017-02-11 NOTE — Patient Instructions (Addendum)
start diovan (valsartan 80 mg daily in its place - use one half daily if develop light headed standing  In the event that you have another flare especially if coughing , I recommend take prilosec 20 mg Take 30- 60 min before your first and last meals of the day until better  Please remember to go to the lab   department downstairs in the basement  for your tests - we will call you with the results when they are available.       If you are satisfied with your treatment plan,  let your doctor know and he/she can either refill your medications or you can return here when your prescription runs out.     If in any way you are not 100% satisfied,  please tell us.  If 100% better, tell your friends!  Pulmonary follow up is as needed

## 2017-02-12 NOTE — Assessment & Plan Note (Signed)
Spirometry 12/31/2016  Only abn = truncation of early portion f/v loop  - FENO 12/31/2016  =   26  - Allergy profile 12/31/2016 >  Eos 0.1 /  IgE  183  Neg RAST  - 12/31/16 rec try off acei > cough and "wheeze" resolved off acei   No evidence at all of asthma typical of Upper airway cough syndrome (previously labeled PNDS) , is  so named because it's frequently impossible to sort out how much is  CR/sinusitis with freq throat clearing (which can be related to primary GERD)   vs  causing  secondary (" extra esophageal")  GERD from wide swings in gastric pressure that occur with throat clearing, often  promoting self use of mint and menthol lozenges that reduce the lower esophageal sphincter tone and exacerbate the problem further in a cyclical fashion.   These are the same pts (now being labeled as having "irritable larynx syndrome" by some cough centers) who not infrequently have a history of having failed to tolerate ace inhibitors(which is likely the case here),  dry powder inhalers or biphosphonates or report having atypical/extraesophageal reflux symptoms that don't respond to standard doses of PPI  and are easily confused as having aecopd or asthma flares by even experienced allergists/ pulmonologists (myself included).   I had an extended final summary discussion with the patient reviewing all relevant studies completed to date and  lasting 15 to 20 minutes of a 25 minute visit on the following issues:   1) avoid acei (see separate a/p)   2) use saba prn > if increases need f/u here - otherwise f/u is prn   3) Each maintenance medication was reviewed in detail including most importantly the difference between maintenance and as needed and under what circumstances the prns are to be used.  Please see AVS for specific  Instructions which are unique to this visit and I personally typed out  which were reviewed in detail in writing with the patient and a copy provided.

## 2017-02-12 NOTE — Assessment & Plan Note (Signed)
Body mass index is 37.02 kg/m.  -  trending up  Lab Results  Component Value Date   TSH 0.20 (L) 11/02/2016     Contributing to gerd risk/ doe/reviewed the need and the process to achieve and maintain neg calorie balance > defer f/u primary care including intermittently monitoring thyroid status

## 2017-02-12 NOTE — Assessment & Plan Note (Signed)
Try off acei due to recurrent cough 12/31/2016 > resolved 02/11/2017 but creat up to 1.43   Lab Results  Component Value Date   CREATININE 1.45 (H) 02/11/2017   CREATININE 0.94 12/31/2016   CREATININE 0.87 05/03/2016   CREATININE 0.64 05/01/2015   CREATININE 0.75 04/29/2014    The effects of acei vs arb on Renal blood flow are very similar so I suspect the problem is not the acei per se but rather the excess bp control using multiple agents and have referred [name] back to cards/pcp for fine tuning   However,  Although even in retrospect it may not be clear the ACEi contributed to the pt's symptoms,  Pt improved off them and adding them back at this point or in the future would risk confusion in interpretation of non-specific respiratory symptoms to which this patient is prone  ie  Better not to muddy the waters here.

## 2017-02-18 ENCOUNTER — Ambulatory Visit (INDEPENDENT_AMBULATORY_CARE_PROVIDER_SITE_OTHER): Payer: Medicare Other | Admitting: Internal Medicine

## 2017-02-18 ENCOUNTER — Encounter: Payer: Self-pay | Admitting: Internal Medicine

## 2017-02-18 VITALS — BP 126/80 | HR 82 | Ht 59.75 in | Wt 185.4 lb

## 2017-02-18 DIAGNOSIS — I428 Other cardiomyopathies: Secondary | ICD-10-CM

## 2017-02-18 DIAGNOSIS — Z79899 Other long term (current) drug therapy: Secondary | ICD-10-CM

## 2017-02-18 DIAGNOSIS — Z95810 Presence of automatic (implantable) cardiac defibrillator: Secondary | ICD-10-CM | POA: Diagnosis not present

## 2017-02-18 LAB — CUP PACEART INCLINIC DEVICE CHECK
Brady Statistic RV Percent Paced: 1 % — CL
Date Time Interrogation Session: 20180608040000
HIGH POWER IMPEDANCE MEASURED VALUE: 59 Ohm
HighPow Impedance: 38 Ohm
Implantable Lead Implant Date: 20110221
Implantable Lead Implant Date: 20110221
Implantable Lead Location: 753860
Implantable Lead Serial Number: 301957
Implantable Pulse Generator Implant Date: 20110221
Lead Channel Pacing Threshold Amplitude: 0.5 V
Lead Channel Pacing Threshold Amplitude: 0.5 V
Lead Channel Pacing Threshold Pulse Width: 0.4 ms
Lead Channel Sensing Intrinsic Amplitude: 15.6 mV
Lead Channel Setting Pacing Amplitude: 2.4 V
Lead Channel Setting Pacing Pulse Width: 0.4 ms
Lead Channel Setting Sensing Sensitivity: 0.6 mV
MDC IDC LEAD LOCATION: 753859
MDC IDC MSMT LEADCHNL RA IMPEDANCE VALUE: 480 Ohm
MDC IDC MSMT LEADCHNL RA SENSING INTR AMPL: 3.3 mV
MDC IDC MSMT LEADCHNL RV IMPEDANCE VALUE: 644 Ohm
MDC IDC MSMT LEADCHNL RV PACING THRESHOLD PULSEWIDTH: 0.4 ms
MDC IDC SET LEADCHNL RA PACING AMPLITUDE: 2 V
MDC IDC STAT BRADY RA PERCENT PACED: 1 % — AB
Pulse Gen Serial Number: 160860

## 2017-02-18 MED ORDER — LOSARTAN POTASSIUM 25 MG PO TABS: 25.0000 mg | ORAL_TABLET | Freq: Every day | ORAL | 3 refills | Status: DC

## 2017-02-18 NOTE — Patient Instructions (Addendum)
Medication Instructions:  Your physician has recommended you make the following change in your medication:  START Losartan 25 mg daily STOP Aldactone    Labwork: BMP - 1 week   Testing/Procedures: None Ordered   Follow-Up: Your physician wants you to follow-up in: 1 year with Dr. Lovena Le. You will receive a reminder letter in the mail two months in advance. If you don't receive a letter, please call our office to schedule the follow-up appointment.  Remote monitoring is used to monitor your ICD from home. This monitoring reduces the number of office visits required to check your device to one time per year. It allows [place] to keep an eye on the functioning of your device to ensure it is working properly. You are scheduled for a device check from home on  05/23/17 . You may send your transmission at any time that day. If you have a wireless device, the transmission will be sent automatically. After your physician reviews your transmission, you will receive a postcard with your next transmission date.    Any Other Special Instructions Will Be Listed Below (If Applicable).     If you need a refill on your cardiac medications before your next appointment, please call your pharmacy.

## 2017-02-18 NOTE — Progress Notes (Signed)
HPI [name] returns today for followup. She is a very pleasant 71 year old woman with a nonischemic cardiomyopathy, chronic class II systolic heart failure, obesity, status post ICD implantation. In the interim, she has been diagnosed with bronchitis due to her ramipril which was stopped by Dr. Melvyn [name]. She was placed on valsartan but developed worsening renal dysfunction and this was stopped. She feels well otherwise and has not had any cough since stopping the ramipril. No Known Allergies   Current Outpatient Prescriptions  Medication Sig Dispense Refill  . albuterol (PROVENTIL HFA;VENTOLIN HFA) 108 (90 Base) MCG/ACT inhaler Inhale 2 puffs into the lungs every 6 (six) hours as needed for wheezing or shortness of breath. 1 Inhaler prn  . aspirin 81 MG tablet Take 81 mg by mouth every morning.     . carvedilol (COREG) 25 MG tablet Take 1 tablet by mouth 2 (two) times daily.    . Cholecalciferol (VITAMIN D PO) Take 2 tablets by mouth every morning.     . folic acid (FOLVITE) 1 MG tablet Take 1 tablet (1 mg total) by mouth daily. 90 tablet 3  . furosemide (LASIX) 40 MG tablet TAKE 1 TABLET BY MOUTH DAILY. 30 tablet 8  . glucose blood (ACCU-CHEK AVIVA) test strip Check daily.  For Diabetes 250.00 100 each 12  . ipratropium-albuterol (DUONEB) 0.5-2.5 (3) MG/3ML SOLN Take 3 mLs by nebulization every 4 (four) hours as needed (shortness of breath or wheezing).    . Lancets MISC Check glucose daily.  Diabetes 250.00 100 each 11  . Levothyroxine Sodium 100 MCG CAPS Take 1 capsule (100 mcg total) by mouth daily before breakfast. 30 capsule 2  . metFORMIN (GLUCOPHAGE) 500 MG tablet Take 500 mg by mouth 2 (two) times daily with a meal.    . Multiple Vitamin (MULTIVITAMIN WITH MINERALS) TABS tablet Take 1 tablet by mouth every morning. chewable    . omeprazole (PRILOSEC) 20 MG capsule TAKE ONE CAPSULE BY MOUTH EVERY DAY 90 capsule 3  . spironolactone (ALDACTONE) 25 MG tablet TAKE 1 TABLET (25 MG TOTAL) BY  MOUTH DAILY. 90 tablet 1   No current facility-administered medications for this visit.      Past Medical History:  Diagnosis Date  . Automatic implantable cardioverter-defibrillator in situ    Dr. Beckie [name] follows-next visit- [phone]  . Borderline diabetes   . Cataract   . Diabetes mellitus without complication (HCC)    Borderline  . Dyslipidemia   . Ejection fraction < 50%    EF 25-30%, September, 2010  . GERD (gastroesophageal reflux disease)   . Hypothyroidism   . ICD (implantable cardiac defibrillator) in place    10/2009; Dr. Lovena [name]  . LBBB (left bundle branch block)    old  . Mitral regurgitation    Mild, echo, September, 2010  . Nonischemic cardiomyopathy ([place])    Normal coronary arteries, catheterization, September, 2009  . Overweight(278.02)   . Systolic CHF, chronic (HCC)    no recent problems    ROS:   All systems reviewed and negative except as noted in the HPI.   Past Surgical History:  Procedure Laterality Date  . CARDIAC CATHETERIZATION     '09 last  . [place]    . COLONOSCOPY N/A 11/29/2013   Procedure: COLONOSCOPY;  Surgeon: Gabrielle Bears, MD;  Location: [facility];  Service: Gastroenterology;  Laterality: N/A;  . COLONOSCOPY N/A 02/25/2015   Procedure: COLONOSCOPY;  Surgeon: Gabrielle Bears, MD;  Location: [facility];  Service:  Gastroenterology;  Laterality: N/A;  . EYE SURGERY Left    x 3-"droopy eyelid"  . heart catherization    . TONSILLECTOMY  1952     Family History  Problem Relation Age of Onset  . Heart attack Mother   . Heart disease Mother   . Hypertension Mother   . Lung cancer Father   . Pancreatic cancer Father   . Cancer Father   . Stroke Paternal Grandfather   . Colon cancer Neg Hx   . Diabetes Neg Hx   . Kidney disease Neg Hx   . Liver disease Neg Hx      Social History   Social History  . Marital status: Single    Spouse name: N/A  . Number of children: N/A  . Years of education: N/A    Occupational History  . Not on file.   Social History Main Topics  . Smoking status: Never Smoker  . Smokeless tobacco: Never Used  . Alcohol use Yes     Comment: 2-4 beers per week  . Drug use: No  . Sexual activity: No   Other Topics Concern  . Not on file   Social History Narrative  . No narrative on file     BP 126/80   Pulse 82   Ht 4' 11.75" (1.518 m)   Wt 185 lb 6.4 oz (84.1 kg)   SpO2 95%   BMI 36.51 kg/m   Physical Exam:  Well appearing middle-age obese woman, NAD HEENT: Unremarkable Neck: 6 cm jugulovenous distention, no thyromegaly Back:  No CVA tenderness Lungs:  Clear with no wheezes, rales, or rhonchi. HEART:  Regular rate rhythm, no murmurs, no rubs, no clicks Abd:  soft, positive bowel sounds, no organomegally, no rebound, no guarding Ext:  2 plus pulses, no edema, no cyanosis, no clubbing Skin:  No rashes no nodules Neuro:  CN II through XII intact, motor grossly intact  DEVICE  Normal device function.  See PaceArt for details.   Assess/Plan: 1. Chronic systolic heart failure - her symptoms remain class 2. She is encouraged to lose weight. She will start losartan 25 mg daily, stop aldactone and we will have her return in a week for a BMP 2. ICD - her [place] ICD is working normally. Will recheck in several months. 3. HTN - her blood pressure is well controlled. She will maintain a low sodium diet and continue her current meds. 4. Obesity - she is encouraged to lose weight.   [name].D.

## 2017-02-21 ENCOUNTER — Other Ambulatory Visit: Payer: Medicare Other

## 2017-02-28 ENCOUNTER — Other Ambulatory Visit: Payer: Medicare Other | Admitting: *Deleted

## 2017-02-28 DIAGNOSIS — Z79899 Other long term (current) drug therapy: Secondary | ICD-10-CM

## 2017-02-28 DIAGNOSIS — I428 Other cardiomyopathies: Secondary | ICD-10-CM

## 2017-03-01 LAB — BASIC METABOLIC PANEL
BUN / CREAT RATIO: 25 (ref 12–28)
BUN: 25 mg/dL (ref 8–27)
CO2: 27 mmol/L (ref 20–29)
Calcium: 9.5 mg/dL (ref 8.7–10.3)
Chloride: 102 mmol/L (ref 96–106)
Creatinine, Ser: 1 mg/dL (ref 0.57–1.00)
GFR, EST AFRICAN AMERICAN: 66 mL/min/{1.73_m2} (ref >59)
GFR, EST NON AFRICAN AMERICAN: 57 mL/min/{1.73_m2} — AB (ref >59)
Glucose: 76 mg/dL (ref 65–99)
Potassium: 3.7 mmol/L (ref 3.5–5.2)
SODIUM: 146 mmol/L — AB (ref 134–144)

## 2017-03-11 ENCOUNTER — Other Ambulatory Visit: Payer: Medicare Other | Admitting: Internal Medicine

## 2017-03-11 ENCOUNTER — Encounter: Payer: Self-pay | Admitting: Internal Medicine

## 2017-03-11 DIAGNOSIS — H40013 Open angle with borderline findings, low risk, bilateral: Secondary | ICD-10-CM | POA: Diagnosis not present

## 2017-03-11 DIAGNOSIS — E039 Hypothyroidism, unspecified: Secondary | ICD-10-CM | POA: Diagnosis not present

## 2017-03-11 DIAGNOSIS — E119 Type 2 diabetes mellitus without complications: Secondary | ICD-10-CM | POA: Diagnosis not present

## 2017-03-11 DIAGNOSIS — H2513 Age-related nuclear cataract, bilateral: Secondary | ICD-10-CM | POA: Diagnosis not present

## 2017-03-11 DIAGNOSIS — H04123 Dry eye syndrome of bilateral lacrimal glands: Secondary | ICD-10-CM | POA: Diagnosis not present

## 2017-03-11 LAB — HM DIABETES EYE EXAM

## 2017-03-11 LAB — TSH: TSH: 0.29 mIU/L — ABNORMAL LOW

## 2017-03-14 ENCOUNTER — Telehealth: Payer: Self-pay | Admitting: Internal Medicine

## 2017-03-14 MED ORDER — LEVOTHYROXINE SODIUM 88 MCG PO TABS: 88.0000 ug | ORAL_TABLET | Freq: Every day | ORAL | 1 refills | Status: DC

## 2017-03-14 NOTE — Telephone Encounter (Signed)
LMTCB

## 2017-03-14 NOTE — Telephone Encounter (Signed)
Pt is aware and has no questions or concerns at this time  

## 2017-03-14 NOTE — Telephone Encounter (Signed)
Pt continues to have low TSH. Need to decrease thyroid replacement medication and follow up at CPE in August. New dose is 0.088 mg daily. Please call pt.

## 2017-04-18 ENCOUNTER — Encounter: Payer: Self-pay | Admitting: Cardiovascular Disease

## 2017-05-03 ENCOUNTER — Other Ambulatory Visit: Payer: Medicare Other | Admitting: Internal Medicine

## 2017-05-03 DIAGNOSIS — I10 Essential (primary) hypertension: Secondary | ICD-10-CM

## 2017-05-03 DIAGNOSIS — E663 Overweight: Secondary | ICD-10-CM | POA: Diagnosis not present

## 2017-05-03 DIAGNOSIS — K219 Gastro-esophageal reflux disease without esophagitis: Secondary | ICD-10-CM | POA: Diagnosis not present

## 2017-05-03 DIAGNOSIS — I5022 Chronic systolic (congestive) heart failure: Secondary | ICD-10-CM

## 2017-05-03 DIAGNOSIS — E119 Type 2 diabetes mellitus without complications: Secondary | ICD-10-CM

## 2017-05-03 DIAGNOSIS — E785 Hyperlipidemia, unspecified: Secondary | ICD-10-CM

## 2017-05-03 DIAGNOSIS — E039 Hypothyroidism, unspecified: Secondary | ICD-10-CM | POA: Diagnosis not present

## 2017-05-03 DIAGNOSIS — Z0000 Encounter for general adult medical examination without abnormal findings: Secondary | ICD-10-CM | POA: Diagnosis not present

## 2017-05-03 LAB — CBC WITH DIFFERENTIAL/PLATELET
BASOS ABS: 50 {cells}/uL (ref 0–200)
Basophils Relative: 1 %
EOS ABS: 150 {cells}/uL (ref 15–500)
Eosinophils Relative: 3 %
HEMATOCRIT: 33.9 % — AB (ref 35.0–45.0)
HEMOGLOBIN: 10.9 g/dL — AB (ref 11.7–15.5)
LYMPHS ABS: 2300 {cells}/uL (ref 850–3900)
Lymphocytes Relative: 46 %
MCH: 33.3 pg — AB (ref 27.0–33.0)
MCHC: 32.2 g/dL (ref 32.0–36.0)
MCV: 103.7 fL — AB (ref 80.0–100.0)
MONO ABS: 400 {cells}/uL (ref 200–950)
MPV: 9.5 fL (ref 7.5–12.5)
Monocytes Relative: 8 %
NEUTROS PCT: 42 %
Neutro Abs: 2100 cells/uL (ref 1500–7800)
Platelets: 349 10*3/uL (ref 140–400)
RBC: 3.27 MIL/uL — ABNORMAL LOW (ref 3.80–5.10)
RDW: 17.4 % — ABNORMAL HIGH (ref 11.0–15.0)
WBC: 5 10*3/uL (ref 3.8–10.8)

## 2017-05-03 LAB — TSH: TSH: 5.53 m[IU]/L — AB

## 2017-05-04 ENCOUNTER — Ambulatory Visit (INDEPENDENT_AMBULATORY_CARE_PROVIDER_SITE_OTHER): Payer: Medicare Other | Admitting: Cardiovascular Disease

## 2017-05-04 ENCOUNTER — Encounter: Payer: Self-pay | Admitting: Cardiovascular Disease

## 2017-05-04 VITALS — BP 120/60 | HR 76 | Ht 59.75 in | Wt 191.8 lb

## 2017-05-04 DIAGNOSIS — I5022 Chronic systolic (congestive) heart failure: Secondary | ICD-10-CM

## 2017-05-04 LAB — MICROALBUMIN / CREATININE URINE RATIO
CREATININE, URINE: 14 mg/dL — AB (ref 20–320)
Microalb Creat Ratio: 36 mcg/mg creat — ABNORMAL HIGH (ref <30)
Microalb, Ur: 0.5 mg/dL

## 2017-05-04 LAB — COMPLETE METABOLIC PANEL WITH GFR
ALBUMIN: 4.1 g/dL (ref 3.6–5.1)
ALK PHOS: 79 U/L (ref 33–130)
ALT: 11 U/L (ref 6–29)
AST: 11 U/L (ref 10–35)
BILIRUBIN TOTAL: 0.7 mg/dL (ref 0.2–1.2)
BUN: 15 mg/dL (ref 7–25)
CALCIUM: 9.3 mg/dL (ref 8.6–10.4)
CO2: 27 mmol/L (ref 20–32)
Chloride: 102 mmol/L (ref 98–110)
Creat: 0.88 mg/dL (ref 0.60–0.93)
GFR, EST AFRICAN AMERICAN: 77 mL/min (ref >=60)
GFR, EST NON AFRICAN AMERICAN: 67 mL/min (ref >=60)
GLUCOSE: 106 mg/dL — AB (ref 65–99)
POTASSIUM: 3.8 mmol/L (ref 3.5–5.3)
Sodium: 143 mmol/L (ref 135–146)
Total Protein: 6.4 g/dL (ref 6.1–8.1)

## 2017-05-04 LAB — LIPID PANEL
CHOLESTEROL: 185 mg/dL (ref <200)
HDL: 58 mg/dL (ref >50)
LDL CALC: 112 mg/dL — AB (ref <100)
Total CHOL/HDL Ratio: 3.2 Ratio (ref <5.0)
Triglycerides: 77 mg/dL (ref <150)
VLDL: 15 mg/dL (ref <30)

## 2017-05-04 LAB — HEMOGLOBIN A1C
HEMOGLOBIN A1C: 5.6 % (ref <5.7)
Mean Plasma Glucose: 114 mg/dL

## 2017-05-04 NOTE — Patient Instructions (Signed)
Medication Instructions:  Your physician recommends that you continue on your current medications as directed. Please refer to the Current Medication list given to you today.   Labwork: Your physician recommends that you return for lab work in: at next office visit for basic metabolic panel   Testing/Procedures: None Ordered   Follow-Up: Your physician wants you to follow-up in: 6 months with Dr. Acie Fredrickson.  You will receive a reminder letter in the mail two months in advance. If you don't receive a letter, please call our office to schedule the follow-up appointment.   If you need a refill on your cardiac medications before your next appointment, please call your pharmacy.   Thank you for choosing [facility]! Christen Bame, RN [phone]

## 2017-05-04 NOTE — Progress Notes (Signed)
Cardiology Office Note   Date:  05/04/2017   ID:  [name], [name] 08-Apr-1946, MRN [id number]  PCP:  Elby Showers, MD  Cardiologist:  [name], MD   Chief Complaint  Patient presents with  . Follow-up    CHF   Problem List 1. Chronic systolic congestive heart failure 2. ICD 3. Hypothyroidism     [name] is a 71 y.o. female who presents today to follow up cardiomyopathy. She is doing very well. She is not having any chest pain or shortness of breath. She's had no syncope or presyncope. Her ICD is followed carefully by Dr. Lovena Le. I saw her last October, 2015. She had an echo shortly after that. Her EF was in the 30-35% range. This is slightly better than it had been. We have her on the maximum doses of medicines that she can tolerate.  December 09, 2015:  Doing well.    Former patient of Dr. Ron Parker.  No CP ,  Chronic DOE with activity .  No dyspnea with sitting or lying down .   Jan. 8, 2018:  Doing well.  Mild dyspnea when she exerts herself , no CP   Aug. 22, 2018: She has a history of  chronic systolic congestive heart failure. Her last echocardiogram was in 2015 which shows an ejection fraction of 30-35%. She has a defibrillator. Has some dyspnea when walking  Is on Coreg 25 BID, Losartan 25 mg a day , Lasix  She was on Spironolactone but this was stopped when she developed some renal insufficiency while on valsartan plus Spironolactone  Has started doing water aeorbics   Past Medical History:  Diagnosis Date  . Automatic implantable cardioverter-defibrillator in situ    Dr. Beckie Salts follows-next visit- [phone]  . Borderline diabetes   . Cataract   . Diabetes mellitus without complication (HCC)    Borderline  . Dyslipidemia   . Ejection fraction < 50%    EF 25-30%, September, 2010  . GERD (gastroesophageal reflux disease)   . Hypothyroidism   . ICD (implantable cardiac defibrillator) in place    10/2009; Dr. Lovena Le  . LBBB (left bundle branch  block)    old  . Mitral regurgitation    Mild, echo, September, 2010  . Nonischemic cardiomyopathy ([place])    Normal coronary arteries, catheterization, September, 2009  . Overweight(278.02)   . Systolic CHF, chronic (HCC)    no recent problems    Past Surgical History:  Procedure Laterality Date  . CARDIAC CATHETERIZATION     '09 last  . [place]    . COLONOSCOPY N/A 11/29/2013   Procedure: COLONOSCOPY;  Surgeon: Jerene Bears, MD;  Location: [facility];  Service: Gastroenterology;  Laterality: N/A;  . COLONOSCOPY N/A 02/25/2015   Procedure: COLONOSCOPY;  Surgeon: Jerene Bears, MD;  Location: [facility];  Service: Gastroenterology;  Laterality: N/A;  . EYE SURGERY Left    x 3-"droopy eyelid"  . heart catherization    . TONSILLECTOMY  1952    Patient Active Problem List   Diagnosis Date Noted  . Chronic systolic CHF (congestive heart failure) ([place])     Priority: High  . Cough variant asthma  vs UACS from ACEi  12/31/2016  . Essential hypertension 12/31/2016  . Morbid obesity due to excess calories ([place]) 12/31/2016  . History of colonic polyps   . Benign neoplasm of descending colon   . Diverticulosis of colon without hemorrhage 01/28/2014  . Serrated adenoma  of colon 01/28/2014  . Nonspecific abnormal finding in stool contents 11/29/2013  . Colon cancer screening 11/29/2013  . History of depression 08/11/2012  . Systolic CHF, chronic ([place])   . LBBB (left bundle branch block)   . Automatic implantable cardioverter-defibrillator in situ   . Hypothyroidism   . GERD (gastroesophageal reflux disease)   . Dyslipidemia   . Type 2 diabetes mellitus ([place])   . Mitral regurgitation   . Overweight   . Ejection fraction < 50%       Current Outpatient Prescriptions  Medication Sig Dispense Refill  . albuterol (PROVENTIL HFA;VENTOLIN HFA) 108 (90 Base) MCG/ACT inhaler Inhale 2 puffs into the lungs every 6 (six) hours as needed for wheezing or shortness of  breath. 1 Inhaler prn  . aspirin 81 MG tablet Take 81 mg by mouth every morning.     . carvedilol (COREG) 25 MG tablet Take 1 tablet by mouth 2 (two) times daily.    . Cholecalciferol (VITAMIN D PO) Take 2 tablets by mouth every morning.     . folic acid (FOLVITE) 1 MG tablet Take 1 tablet (1 mg total) by mouth daily. 90 tablet 3  . furosemide (LASIX) 40 MG tablet TAKE 1 TABLET BY MOUTH DAILY. 30 tablet 8  . glucose blood (ACCU-CHEK AVIVA) test strip Check daily.  For Diabetes 250.00 100 each 12  . ipratropium-albuterol (DUONEB) 0.5-2.5 (3) MG/3ML SOLN Take 3 mLs by nebulization every 4 (four) hours as needed (shortness of breath or wheezing).    . Lancets MISC Check glucose daily.  Diabetes 250.00 100 each 11  . levothyroxine (SYNTHROID, LEVOTHROID) 88 MCG tablet Take 1 tablet (88 mcg total) by mouth daily. 30 tablet 1  . losartan (COZAAR) 25 MG tablet Take 1 tablet (25 mg total) by mouth daily. 90 tablet 3  . metFORMIN (GLUCOPHAGE) 500 MG tablet Take 500 mg by mouth 2 (two) times daily with a meal.    . Multiple Vitamin (MULTIVITAMIN WITH MINERALS) TABS tablet Take 1 tablet by mouth every morning. chewable    . omeprazole (PRILOSEC) 20 MG capsule TAKE ONE CAPSULE BY MOUTH EVERY DAY 90 capsule 3   No current facility-administered medications for this visit.     Allergies:   Patient has no known allergies.    Social History:  The patient  reports that she has never smoked. She has never used smokeless tobacco. She reports that she drinks alcohol. She reports that she does not use drugs.   Family History:  The patient's family history includes Cancer in her father; Heart attack in her mother; Heart disease in her mother; Hypertension in her mother; Lung cancer in her father; Pancreatic cancer in her father; Stroke in her paternal grandfather.    ROS:  Please see the history of present illness.  Patient denies fever, chills, headache, sweats, rash, change in vision, change in hearing, chest  pain, cough, nausea or vomiting, urinary symptoms. All other systems are reviewed and are negative.      PHYSICAL EXAM: VS:  BP 120/60   Pulse 76   Ht 4' 11.75" (1.518 m)   Wt 191 lb 12.8 oz (87 kg)   BMI 37.77 kg/m  , Patient is oriented to person time and place. Affect is normal. She is overweight. She is stable. Head is atraumatic. Sclera and conjunctiva are normal. There is no jugular venous distention. Lungs are clear. Respiratory effort is not labored. Cardiac exam reveals an S1 and S2. There are no clicks  or significant murmurs. The abdomen is soft. There is no peripheral edema. There are no musculoskeletal deformities. There are no skin rashes.    EKG:     Recent Labs: 11/23/2016: Brain Natriuretic Peptide 237.6 05/03/2017: ALT 11; BUN 15; Creat 0.88; Hemoglobin 10.9; Platelets 349; Potassium 3.8; Sodium 143; TSH 5.53    Lipid Panel    Component Value Date/Time   CHOL 185 05/03/2017 1021   TRIG 77 05/03/2017 1021   HDL 58 05/03/2017 1021   CHOLHDL 3.2 05/03/2017 1021   VLDL 15 05/03/2017 1021   LDLCALC 112 (H) 05/03/2017 1021      Wt Readings from Last 3 Encounters:  05/04/17 191 lb 12.8 oz (87 kg)  02/18/17 185 lb 6.4 oz (84.1 kg)  02/11/17 188 lb (85.3 kg)      Current medicines are reviewed  The patient understands her medications.     ASSESSMENT AND PLAN:  1. Chronic systolic congestive heart failure - continue current meds .   She has an EF of 30-35%.   Has an ICD.  She is on Coreg,  Losartan  Potassium and creatinine are stable  Will consider repeat echo in the near future.   She has no worsening of symptoms  Will see her in 6 months with BMP    2. ICD - plans per Dr. Lovena Le   3. Hypothyroidism - followed by her primary    [name], MD  05/04/2017 4:19 PM    [address] [place],  [place], [place]  [address] Pager [phone] Phone: [phone]; Fax: [phone]

## 2017-05-05 ENCOUNTER — Other Ambulatory Visit: Payer: Self-pay | Admitting: Internal Medicine

## 2017-05-05 NOTE — Telephone Encounter (Signed)
SHE SAID SHE DOES HAVE ENOUGH UNTIL HER CPE APPT.

## 2017-05-05 NOTE — Telephone Encounter (Signed)
Call patient. She has abnormal  TSH. Does she have enough Synthroid to last until her appt. Pharmacy calling for refill and I made need to change at appt.

## 2017-05-05 NOTE — Telephone Encounter (Signed)
LMTRC

## 2017-05-06 ENCOUNTER — Encounter: Payer: Medicare Other | Admitting: Internal Medicine

## 2017-05-09 ENCOUNTER — Other Ambulatory Visit: Payer: Self-pay | Admitting: Internal Medicine

## 2017-05-09 DIAGNOSIS — Z1231 Encounter for screening mammogram for malignant neoplasm of breast: Secondary | ICD-10-CM

## 2017-05-10 ENCOUNTER — Ambulatory Visit (INDEPENDENT_AMBULATORY_CARE_PROVIDER_SITE_OTHER): Payer: Medicare Other | Admitting: Internal Medicine

## 2017-05-10 ENCOUNTER — Encounter: Payer: Self-pay | Admitting: Internal Medicine

## 2017-05-10 ENCOUNTER — Other Ambulatory Visit: Payer: Self-pay | Admitting: Internal Medicine

## 2017-05-10 VITALS — BP 102/60 | HR 76 | Temp 98.7°F | Ht 59.0 in | Wt 192.0 lb

## 2017-05-10 DIAGNOSIS — I10 Essential (primary) hypertension: Secondary | ICD-10-CM | POA: Diagnosis not present

## 2017-05-10 DIAGNOSIS — R7989 Other specified abnormal findings of blood chemistry: Secondary | ICD-10-CM

## 2017-05-10 DIAGNOSIS — E8881 Metabolic syndrome: Secondary | ICD-10-CM

## 2017-05-10 DIAGNOSIS — I428 Other cardiomyopathies: Secondary | ICD-10-CM | POA: Diagnosis not present

## 2017-05-10 DIAGNOSIS — M8580 Other specified disorders of bone density and structure, unspecified site: Secondary | ICD-10-CM

## 2017-05-10 DIAGNOSIS — Z95810 Presence of automatic (implantable) cardiac defibrillator: Secondary | ICD-10-CM

## 2017-05-10 DIAGNOSIS — K219 Gastro-esophageal reflux disease without esophagitis: Secondary | ICD-10-CM

## 2017-05-10 DIAGNOSIS — D7589 Other specified diseases of blood and blood-forming organs: Secondary | ICD-10-CM | POA: Diagnosis not present

## 2017-05-10 DIAGNOSIS — R946 Abnormal results of thyroid function studies: Secondary | ICD-10-CM

## 2017-05-10 DIAGNOSIS — F419 Anxiety disorder, unspecified: Secondary | ICD-10-CM

## 2017-05-10 DIAGNOSIS — E039 Hypothyroidism, unspecified: Secondary | ICD-10-CM | POA: Diagnosis not present

## 2017-05-10 DIAGNOSIS — Z0000 Encounter for general adult medical examination without abnormal findings: Secondary | ICD-10-CM

## 2017-05-10 DIAGNOSIS — F329 Major depressive disorder, single episode, unspecified: Secondary | ICD-10-CM

## 2017-05-10 DIAGNOSIS — R7302 Impaired glucose tolerance (oral): Secondary | ICD-10-CM

## 2017-05-10 DIAGNOSIS — D649 Anemia, unspecified: Secondary | ICD-10-CM | POA: Diagnosis not present

## 2017-05-10 LAB — POCT URINALYSIS DIPSTICK
Bilirubin, UA: NEGATIVE
Blood, UA: NEGATIVE
Glucose, UA: NEGATIVE
KETONES UA: NEGATIVE
Leukocytes, UA: NEGATIVE
Nitrite, UA: NEGATIVE
PH UA: 6 (ref 5.0–8.0)
PROTEIN UA: NEGATIVE
Spec Grav, UA: 1.025 (ref 1.010–1.025)
UROBILINOGEN UA: 0.2 U/dL

## 2017-05-10 NOTE — Progress Notes (Signed)
Subjective:    Patient ID: [name], female    DOB: 1945-10-21, 71 y.o.   MRN: [id number]  HPI 71 year old white female with multiple medical issues including metabolic syndrome, hyperlipidemia, GE reflux, hypothyroidism, nonischemic cardiomyopathy, cardiac defibrillator, mitral regurgitation, left bundle branch block, obesity, hypertension, depression, history of asthmatic bronchitis and anemia.  She does not smoke. Occasionally drinks beer.  She has a living well.  No known drug allergies.  Ophthalmologist is Dr. Herbert Deaner. She wears glasses.  Dislocated right shoulder in 2001. Multiple eye surgeries as a child. Tonsillectomy at age 40. History of allergic rhinitis.  Social history: She is retired. Previously worked at [employer] and subsequently at [employer]. She is single. Completed 4 years of college.  Family history: Father died of lung cancer in his 63s. Mother died at age 77 of an MI with history of possible diabetes. No siblings. No children.  She has an elevated TSH on recent lab work. Admits that she's taking her thyroid medication with all of her other medications in the mornings. Reminded her that she needs to be taking thyroid medication on an empty stomach and with no other medications. Suggested she try taking it at night before going to bed. We will need to follow-up on elevated TSH in a few weeks.  She also has a mild anemia 10.9 g in 4 months ago hemoglobin was 12.1 g. This anemia has been present for some time and is macrocytic. Her MCV is 103.7. In March her B-12 level was 1648 and a year previously was 619. Folate level was 19.85 months ago and before that was 5.9 which was just rarely over normal. Today we have ordered B-12 and folate levels. I would like her to take folate 1 mg daily and she will follow-up. In a few weeks.    Review of Systems  Constitutional: Positive for fatigue.  Respiratory: Negative.   Cardiovascular:  Negative.   Gastrointestinal: Negative.   Genitourinary: Negative.   Neurological: Negative.   Psychiatric/Behavioral: Negative.        Objective:   Physical Exam  Constitutional: She is oriented to person, place, and time. She appears well-developed and well-nourished. No distress.  HENT:  Head: Normocephalic and atraumatic.  Right Ear: External ear normal.  Left Ear: External ear normal.  Mouth/Throat: Oropharynx is clear and moist.  Eyes: Pupils are equal, round, and reactive to light. Conjunctivae and EOM are normal. Right eye exhibits no discharge. Left eye exhibits no discharge. No scleral icterus.  Neck: Neck supple. No JVD present. No thyromegaly present.  Cardiovascular: Normal rate, regular rhythm, normal heart sounds and intact distal pulses.   No murmur heard. Pulmonary/Chest: No respiratory distress. She has no wheezes. She has no rales. She exhibits no tenderness.  Abdominal: She exhibits no distension and no mass. There is no tenderness. There is no rebound and no guarding.  Genitourinary:  Genitourinary Comments: Bimanual normal. Pap deferred due to age  Musculoskeletal: She exhibits no edema.  Lymphadenopathy:    She has no cervical adenopathy.  Neurological: She is alert and oriented to person, place, and time. She has normal reflexes. No cranial nerve deficit. Coordination normal.  Skin: Skin is warm and dry. No rash noted. She is not diaphoretic.  Psychiatric: She has a normal mood and affect. Her behavior is normal. Judgment and thought content normal.  Vitals reviewed.         Assessment & Plan:  Macrocytic anemia-B-12 and folate levels have been  repeated today. It is not clear to me why her hemoglobin was 12.1 g just a few months ago and is now 10.9 g. She had a colonoscopy by Dr. Hilarie Fredrickson in 2016. A sessile serrated adenoma had been removed from the ascending colon and a tattoo placed in the past. In 2016 no evidence of this adenoma recurring noted. History  of moderate diverticulosis.  History of allergic rhinitis  History of asthmatic bronchitis and bronchospasm  Nonischemic cardiomyopathy with cardiac defibrillator  Mitral regurgitation  Left bundle branch block  Obesity  Hypertension  History depression  Hypothyroidism-continue same dose of thyroid replacement. Do not take thyroid medication with other med's and take it on an empty stomach.    GE reflux  Metabolic syndrome  Hyperlipidemia   Plan: She will try folate 1 mg daily and continue iron supplement. Follow-up in approximately 6 weeks. She needs to take thyroid medication on an empty stomach without other medications  Subjective:   Patient presents for Medicare Annual/Subsequent preventive examination.  Review Past Medical/Family/Social:See above   Risk Factors  Current exercise habits: Walks the dog Dietary issues discussed: Low fat low carbohydrate  Cardiac risk factors: Impaired glucose tolerance, hyperlipidemia, metabolic syndrome MI in mother  Depression Screen  (Note: if answer to either of the following is "Yes", a more complete depression screening is indicated)   Over the past two weeks, have you felt down, depressed or hopeless? No  Over the past two weeks, have you felt little interest or pleasure in doing things? No Have you lost interest or pleasure in daily life? No Do you often feel hopeless? No Do you cry easily over simple problems? No   Activities of Daily Living  In your present state of health, do you have any difficulty performing the following activities?:   Driving? No  Managing money? No  Feeding yourself? No  Getting from bed to chair? No  Climbing a flight of stairs? No  Preparing food and eating?: No  Bathing or showering? No  Getting dressed: No  Getting to the toilet? No  Using the toilet:No  Moving around from place to place: No  In the past year have you fallen or had a near fall?:Yes Are you sexually active? No    Do you have more than one partner? No   Hearing Difficulties: No  Do you often ask people to speak up or repeat themselves? Yes Do you experience ringing or noises in your ears? No  Do you have difficulty understanding soft or whispered voices? Yes Do you feel that you have a problem with memory? No Do you often misplace items? No    Home Safety:  Do you have a smoke alarm at your residence? No Do you have grab bars in the bathroom?No Do you have throw rugs in your house? No   Cognitive Testing  Alert? Yes Normal Appearance?Yes  Oriented to person? Yes Place? Yes  Time? Yes  Recall of three objects? Yes  Can perform simple calculations? Yes  Displays appropriate judgment?Yes  Can read the correct time from a watch face?Yes   List the Names of Other Physician/Practitioners you currently use:  See referral list for the physicians patient is currently seeing.     Review of Systems: See above   Objective:     General appearance: Appears stated age and  obese  Head: Normocephalic, without obvious abnormality, atraumatic  Eyes: conj clear, EOMi PEERLA  Ears: normal TM's and external ear canals both ears  Nose: Nares normal. Septum midline. Mucosa normal. No drainage or sinus tenderness.  Throat: lips, mucosa, and tongue normal; teeth and gums normal  Neck: no adenopathy, no carotid bruit, no JVD, supple, symmetrical, trachea midline and thyroid not enlarged, symmetric, no tenderness/mass/nodules  No CVA tenderness.  Lungs: clear to auscultation bilaterally  Breasts: normal appearance, no masses or tenderness Heart: regular rate and rhythm, S1, S2 normal, no murmur, click, rub or gallop  Abdomen: soft, non-tender; bowel sounds normal; no masses, no organomegaly  Musculoskeletal: ROM normal in all joints, no crepitus, no deformity, Normal muscle strengthen. Back  is symmetric, no curvature. Skin: Skin color, texture, turgor normal. No rashes or lesions  Lymph nodes:  Cervical, supraclavicular, and axillary nodes normal.  Neurologic: CN 2 -12 Normal, Normal symmetric reflexes. Normal coordination and gait  Psych: Alert & Oriented x 3, Mood appear stable.    Assessment:    Annual wellness medicare exam   Plan:    During the course of the visit the patient was educated and counseled about appropriate screening and preventive services including:   Annual mammogram  Annual flu vaccine     Patient Instructions (the written plan) was given to the patient.  Medicare Attestation  I have personally reviewed:  The patient's medical and social history  Their use of alcohol, tobacco or illicit drugs  Their current medications and supplements  The patient's functional ability including ADLs,fall risks, home safety risks, cognitive, and hearing and visual impairment  Diet and physical activities  Evidence for depression or mood disorders  The patient's weight, height, BMI, and visual acuity have been recorded in the chart. I have made referrals, counseling, and provided education to the patient based on review of the above and I have provided the patient with a written personalized care plan for preventive services.

## 2017-05-10 NOTE — Patient Instructions (Addendum)
It was a pleasure to see you today. B-12 and folate pending. Take thyroid medication on an empty stomach with no other medications. Follow-up early October with TSH and CBC

## 2017-05-11 LAB — B12 AND FOLATE PANEL: VITAMIN B 12: 1729 pg/mL — AB (ref 200–1100)

## 2017-05-17 ENCOUNTER — Ambulatory Visit (INDEPENDENT_AMBULATORY_CARE_PROVIDER_SITE_OTHER): Payer: Medicare Other | Admitting: Internal Medicine

## 2017-05-17 DIAGNOSIS — Z23 Encounter for immunization: Secondary | ICD-10-CM | POA: Diagnosis not present

## 2017-05-17 NOTE — Patient Instructions (Signed)
Flu shot given in left arm

## 2017-05-17 NOTE — Progress Notes (Signed)
Flu shot given today in the Left arm. Pt tolerated well, VIS given. Lenise Arena, CMA

## 2017-05-20 ENCOUNTER — Ambulatory Visit
Admission: RE | Admit: 2017-05-20 | Discharge: 2017-05-20 | Disposition: A | Discharge status: Home / Self Care | Payer: Medicare Other | Source: Ambulatory Visit | Attending: Internal Medicine | Admitting: Internal Medicine

## 2017-05-20 DIAGNOSIS — Z1231 Encounter for screening mammogram for malignant neoplasm of breast: Secondary | ICD-10-CM | POA: Diagnosis not present

## 2017-05-23 ENCOUNTER — Ambulatory Visit (INDEPENDENT_AMBULATORY_CARE_PROVIDER_SITE_OTHER): Payer: Medicare Other | Admitting: *Deleted

## 2017-05-23 DIAGNOSIS — I428 Other cardiomyopathies: Secondary | ICD-10-CM

## 2017-05-23 NOTE — Progress Notes (Signed)
Remote ICD transmission.   

## 2017-05-25 ENCOUNTER — Encounter: Payer: Self-pay | Admitting: Cardiology

## 2017-05-25 LAB — CUP PACEART REMOTE DEVICE CHECK
Battery Remaining Longevity: 60 mo
Battery Remaining Percentage: 70 %
HighPow Impedance: 52 Ohm
Implantable Lead Implant Date: 20110221
Implantable Lead Location: 753860
Implantable Lead Model: 157
Implantable Lead Model: 5076
Implantable Lead Serial Number: 301957
Implantable Pulse Generator Implant Date: 20110221
Lead Channel Impedance Value: 594 Ohm
Lead Channel Pacing Threshold Pulse Width: 0.4 ms
Lead Channel Setting Pacing Amplitude: 2 V
Lead Channel Setting Pacing Amplitude: 2.4 V
Lead Channel Setting Pacing Pulse Width: 0.4 ms
Lead Channel Setting Sensing Sensitivity: 0.6 mV
MDC IDC LEAD IMPLANT DT: 20110221
MDC IDC LEAD LOCATION: 753859
MDC IDC MSMT LEADCHNL RA IMPEDANCE VALUE: 447 Ohm
MDC IDC MSMT LEADCHNL RA PACING THRESHOLD AMPLITUDE: 0.5 V
MDC IDC MSMT LEADCHNL RA PACING THRESHOLD PULSEWIDTH: 0.4 ms
MDC IDC MSMT LEADCHNL RV PACING THRESHOLD AMPLITUDE: 0.5 V
MDC IDC SESS DTM: 20180910055100
MDC IDC STAT BRADY RA PERCENT PACED: 0 %
MDC IDC STAT BRADY RV PERCENT PACED: 0 %
Pulse Gen Serial Number: 160860

## 2017-06-14 ENCOUNTER — Other Ambulatory Visit: Payer: Medicare Other | Admitting: Internal Medicine

## 2017-06-14 DIAGNOSIS — E039 Hypothyroidism, unspecified: Secondary | ICD-10-CM | POA: Diagnosis not present

## 2017-06-14 LAB — TSH: TSH: 12.44 mIU/L — ABNORMAL HIGH (ref 0.40–4.50)

## 2017-06-21 ENCOUNTER — Encounter: Payer: Self-pay | Admitting: Internal Medicine

## 2017-06-21 ENCOUNTER — Ambulatory Visit (INDEPENDENT_AMBULATORY_CARE_PROVIDER_SITE_OTHER): Payer: Medicare Other | Admitting: Internal Medicine

## 2017-06-21 VITALS — BP 102/60 | HR 96 | Temp 97.5°F | Wt 193.0 lb

## 2017-06-21 DIAGNOSIS — E039 Hypothyroidism, unspecified: Secondary | ICD-10-CM

## 2017-06-21 DIAGNOSIS — R7989 Other specified abnormal findings of blood chemistry: Secondary | ICD-10-CM

## 2017-06-21 DIAGNOSIS — R7302 Impaired glucose tolerance (oral): Secondary | ICD-10-CM | POA: Insufficient documentation

## 2017-06-21 MED ORDER — LEVOTHYROXINE SODIUM 100 MCG PO TABS: 100.0000 ug | ORAL_TABLET | Freq: Every day | ORAL | 1 refills | Status: DC

## 2017-06-21 NOTE — Progress Notes (Signed)
   Subjective:    Patient ID: [name], female    DOB: 1946-02-05, 71 y.o.   MRN: [id number]  HPI For 6-week follow-up on hypothyroidism.  Was seen in August for physical examination.  TSH at that time was 5.53 and previously had been 0.29.  She was reminded to take this medication on an empty stomach.  And she is now here for follow-up.  Oddly enough TSH is even higher at 12.44 despite her taking the medication on an empty stomach.   Current dose of levothyroxine is 0.088 mg daily.  We are going to increase levothyroxine to 0.1 mg daily and follow-up in 6 weeks which will be around November 20 without office visit just lab work.    Review of Systems     Objective:   Physical Exam No thyromegaly       Assessment & Plan:  Elevated TSH  Plan: Increase levothyroxine to 0.1 mg daily and follow-up November 20 with TSH without office visit.

## 2017-07-09 NOTE — Patient Instructions (Signed)
Change thyroid replacement therapy to 0.1 mg daily and follow-up November 20 without office visit just TSH to be drawn.  Take medication on empty stomach without food or other drugs

## 2017-08-02 ENCOUNTER — Other Ambulatory Visit: Payer: Medicare Other | Admitting: Internal Medicine

## 2017-08-02 ENCOUNTER — Other Ambulatory Visit: Payer: Self-pay

## 2017-08-02 DIAGNOSIS — E039 Hypothyroidism, unspecified: Secondary | ICD-10-CM

## 2017-08-02 LAB — TSH: TSH: 11.17 mIU/L — ABNORMAL HIGH (ref 0.40–4.50)

## 2017-08-02 MED ORDER — CARVEDILOL 25 MG PO TABS: 25.0000 mg | ORAL_TABLET | Freq: Two times a day (BID) | ORAL | 2 refills | Status: DC

## 2017-08-03 ENCOUNTER — Telehealth: Payer: Self-pay

## 2017-08-03 MED ORDER — LEVOTHYROXINE SODIUM 125 MCG PO TABS: 125.0000 ug | ORAL_TABLET | Freq: Every day | ORAL | 0 refills | Status: DC

## 2017-08-03 NOTE — Telephone Encounter (Signed)
-----   Message from Elby Showers, MD sent at 08/03/2017 10:07 AM EST ----- Increase levothyroxine to 0.125 mg daily and book ov with TSH before OV in 6 weeks.

## 2017-08-03 NOTE — Telephone Encounter (Signed)
Appt made and medication sent

## 2017-08-05 ENCOUNTER — Other Ambulatory Visit: Payer: Self-pay | Admitting: Cardiovascular Disease

## 2017-08-22 ENCOUNTER — Ambulatory Visit (INDEPENDENT_AMBULATORY_CARE_PROVIDER_SITE_OTHER): Payer: Medicare Other | Admitting: *Deleted

## 2017-08-22 DIAGNOSIS — I428 Other cardiomyopathies: Secondary | ICD-10-CM

## 2017-08-22 NOTE — Progress Notes (Signed)
Remote ICD transmission.   

## 2017-08-23 ENCOUNTER — Other Ambulatory Visit: Payer: Self-pay | Admitting: Internal Medicine

## 2017-08-26 ENCOUNTER — Encounter: Payer: Self-pay | Admitting: Cardiology

## 2017-08-27 LAB — CUP PACEART REMOTE DEVICE CHECK
Battery Remaining Longevity: 54 mo
Battery Remaining Percentage: 65 %
Date Time Interrogation Session: 20181210065100
HighPow Impedance: 55 Ohm
Implantable Lead Implant Date: 20110221
Implantable Lead Location: 753859
Implantable Lead Location: 753860
Implantable Lead Serial Number: 301957
Implantable Pulse Generator Implant Date: 20110221
Lead Channel Impedance Value: 614 Ohm
Lead Channel Pacing Threshold Pulse Width: 0.4 ms
Lead Channel Setting Pacing Amplitude: 2.4 V
Lead Channel Setting Pacing Pulse Width: 0.4 ms
Lead Channel Setting Sensing Sensitivity: 0.6 mV
MDC IDC LEAD IMPLANT DT: 20110221
MDC IDC MSMT LEADCHNL RA IMPEDANCE VALUE: 423 Ohm
MDC IDC MSMT LEADCHNL RA PACING THRESHOLD AMPLITUDE: 0.5 V
MDC IDC MSMT LEADCHNL RA PACING THRESHOLD PULSEWIDTH: 0.4 ms
MDC IDC MSMT LEADCHNL RV PACING THRESHOLD AMPLITUDE: 0.5 V
MDC IDC SET LEADCHNL RA PACING AMPLITUDE: 2 V
MDC IDC STAT BRADY RA PERCENT PACED: 0 %
MDC IDC STAT BRADY RV PERCENT PACED: 0 %
Pulse Gen Serial Number: 160860

## 2017-09-02 ENCOUNTER — Other Ambulatory Visit: Payer: Self-pay | Admitting: Internal Medicine

## 2017-09-02 DIAGNOSIS — E039 Hypothyroidism, unspecified: Secondary | ICD-10-CM

## 2017-09-09 DIAGNOSIS — H04123 Dry eye syndrome of bilateral lacrimal glands: Secondary | ICD-10-CM | POA: Diagnosis not present

## 2017-09-09 DIAGNOSIS — H40013 Open angle with borderline findings, low risk, bilateral: Secondary | ICD-10-CM | POA: Diagnosis not present

## 2017-09-15 ENCOUNTER — Other Ambulatory Visit: Payer: Medicare Other | Admitting: Internal Medicine

## 2017-09-15 DIAGNOSIS — E039 Hypothyroidism, unspecified: Secondary | ICD-10-CM | POA: Diagnosis not present

## 2017-09-15 LAB — TSH: TSH: 5.62 mIU/L — ABNORMAL HIGH (ref 0.40–4.50)

## 2017-09-16 ENCOUNTER — Encounter: Payer: Self-pay | Admitting: Internal Medicine

## 2017-09-16 ENCOUNTER — Ambulatory Visit (INDEPENDENT_AMBULATORY_CARE_PROVIDER_SITE_OTHER): Payer: Medicare Other | Admitting: Internal Medicine

## 2017-09-16 VITALS — BP 118/72 | HR 72 | Temp 98.0°F | Ht 59.0 in | Wt 198.0 lb

## 2017-09-16 DIAGNOSIS — R7989 Other specified abnormal findings of blood chemistry: Secondary | ICD-10-CM

## 2017-09-16 DIAGNOSIS — E039 Hypothyroidism, unspecified: Secondary | ICD-10-CM

## 2017-09-16 DIAGNOSIS — M6530 Trigger finger, unspecified finger: Secondary | ICD-10-CM

## 2017-09-16 MED ORDER — LEVOTHYROXINE SODIUM 150 MCG PO TABS: 150.0000 ug | ORAL_TABLET | Freq: Every day | ORAL | 0 refills | Status: DC

## 2017-09-16 NOTE — Progress Notes (Signed)
   Subjective:    Patient ID: [name], female    DOB: [birth year], 72 y.o.   MRN: [id number]  HPI 72 year old Female with complaint of right fourth finger popping at the PIP joint.  She has stiffness in her hands in the mornings.  Unable to tolerate perhaps Aleve-may cause a rash however she is not sure.  This is not listed in her chart as an allergy.  She is here today for follow-up on hypothyroidism..  Was experiencing some cold intolerance prior to adjustment of her medication.  Says she is taking medication correctly on empty stomach    Review of Systems see above    Objective:   Physical Exam  No thyromegaly.  Appears to have trigger finger right fourth finger.      Assessment & Plan:  May need to see orthopedist/hand surgeon regarding right fourth finger.  Appears to have trigger finger.  Hypothyroidism  Plan: TSH is improved from 11.17 to 5.62 but is still not therapeutic.  She will increase levothyroxine to 0.15 mg daily and follow-up in 6-8 weeks.

## 2017-09-28 NOTE — Patient Instructions (Signed)
Increase levothyroxine to 0.15 mg daily and return in 6-8 weeks

## 2017-10-12 ENCOUNTER — Other Ambulatory Visit: Payer: Self-pay

## 2017-10-12 DIAGNOSIS — E039 Hypothyroidism, unspecified: Secondary | ICD-10-CM

## 2017-10-31 ENCOUNTER — Telehealth: Payer: Self-pay | Admitting: Internal Medicine

## 2017-10-31 ENCOUNTER — Other Ambulatory Visit: Payer: Self-pay | Admitting: Internal Medicine

## 2017-10-31 ENCOUNTER — Encounter: Payer: Self-pay | Admitting: Internal Medicine

## 2017-10-31 MED ORDER — FOLIC ACID 1 MG PO TABS: 1.0000 mg | ORAL_TABLET | Freq: Every day | ORAL | 3 refills | Status: DC

## 2017-10-31 MED ORDER — LEVOTHYROXINE SODIUM 150 MCG PO TABS: 150.0000 ug | ORAL_TABLET | Freq: Every day | ORAL | 0 refills | Status: DC

## 2017-10-31 NOTE — Telephone Encounter (Signed)
Now on Levothyroxine 0.15 mg daily. This was refilled. Folvite refilled x one year.

## 2017-11-08 ENCOUNTER — Other Ambulatory Visit: Payer: Medicare Other | Admitting: Internal Medicine

## 2017-11-08 DIAGNOSIS — E039 Hypothyroidism, unspecified: Secondary | ICD-10-CM | POA: Diagnosis not present

## 2017-11-08 LAB — TSH: TSH: 2.55 mIU/L (ref 0.40–4.50)

## 2017-11-11 ENCOUNTER — Encounter: Payer: Self-pay | Admitting: Internal Medicine

## 2017-11-11 ENCOUNTER — Ambulatory Visit (INDEPENDENT_AMBULATORY_CARE_PROVIDER_SITE_OTHER): Payer: Medicare Other | Admitting: Internal Medicine

## 2017-11-11 VITALS — BP 100/60 | HR 81 | Wt 201.0 lb

## 2017-11-11 DIAGNOSIS — E039 Hypothyroidism, unspecified: Secondary | ICD-10-CM | POA: Diagnosis not present

## 2017-11-11 NOTE — Progress Notes (Signed)
   Subjective:    Patient ID: [name], female    DOB: 1946-02-20, 72 y.o.   MRN: [id number]  HPI 72 year old female who has had fluctuations in TSH recently.  We have been adjusting her medication.  Her TSH done February 26 is now falling within normal limits at 2.55 having been 5.6-2 months ago at which time we increase levothyroxine to 0.15 mg daily.    Review of Systems see above     Objective:   Physical Exam No thyromegaly-feels well with no new complaints       Assessment & Plan:  Hypothyroidism-stable on levothyroxine 0.15 mg daily  Plan: Continue 0.15 mg levothyroxine daily for hypothyroidism and follow-up in August for physical exam

## 2017-11-21 ENCOUNTER — Ambulatory Visit (INDEPENDENT_AMBULATORY_CARE_PROVIDER_SITE_OTHER): Payer: Medicare Other | Admitting: *Deleted

## 2017-11-21 DIAGNOSIS — I428 Other cardiomyopathies: Secondary | ICD-10-CM

## 2017-11-21 NOTE — Progress Notes (Signed)
Remote ICD transmission.   

## 2017-11-23 ENCOUNTER — Encounter: Payer: Self-pay | Admitting: Cardiology

## 2017-12-03 NOTE — Patient Instructions (Addendum)
Continue same dose of levothyroxine 0.15 mg daily and follow-up in August for physical examination

## 2017-12-10 LAB — CUP PACEART REMOTE DEVICE CHECK
Battery Remaining Longevity: 54 mo
Battery Remaining Percentage: 62 %
Brady Statistic RA Percent Paced: 0 %
Brady Statistic RV Percent Paced: 0 %
Date Time Interrogation Session: 20190311055100
HIGH POWER IMPEDANCE MEASURED VALUE: 51 Ohm
Implantable Lead Implant Date: 20110221
Implantable Lead Location: 753860
Implantable Lead Model: 157
Implantable Lead Model: 5076
Lead Channel Impedance Value: 457 Ohm
Lead Channel Pacing Threshold Amplitude: 0.5 V
Lead Channel Pacing Threshold Pulse Width: 0.4 ms
Lead Channel Setting Pacing Amplitude: 2 V
Lead Channel Setting Pacing Pulse Width: 0.4 ms
MDC IDC LEAD IMPLANT DT: 20110221
MDC IDC LEAD LOCATION: 753859
MDC IDC LEAD SERIAL: 301957
MDC IDC MSMT LEADCHNL RV IMPEDANCE VALUE: 536 Ohm
MDC IDC MSMT LEADCHNL RV PACING THRESHOLD AMPLITUDE: 0.5 V
MDC IDC MSMT LEADCHNL RV PACING THRESHOLD PULSEWIDTH: 0.4 ms
MDC IDC PG IMPLANT DT: 20110221
MDC IDC SET LEADCHNL RV PACING AMPLITUDE: 2.4 V
MDC IDC SET LEADCHNL RV SENSING SENSITIVITY: 0.6 mV
Pulse Gen Serial Number: 160860

## 2018-01-09 ENCOUNTER — Encounter: Payer: Self-pay | Admitting: Internal Medicine

## 2018-01-09 ENCOUNTER — Ambulatory Visit
Admission: RE | Admit: 2018-01-09 | Discharge: 2018-01-09 | Disposition: A | Discharge status: Home / Self Care | Payer: Medicare Other | Source: Ambulatory Visit | Attending: Internal Medicine | Admitting: Internal Medicine

## 2018-01-09 ENCOUNTER — Ambulatory Visit (INDEPENDENT_AMBULATORY_CARE_PROVIDER_SITE_OTHER): Payer: Medicare Other | Admitting: Internal Medicine

## 2018-01-09 VITALS — BP 130/70 | HR 100 | Temp 98.4°F | Ht 59.0 in | Wt 198.0 lb

## 2018-01-09 DIAGNOSIS — R05 Cough: Secondary | ICD-10-CM

## 2018-01-09 DIAGNOSIS — R0902 Hypoxemia: Secondary | ICD-10-CM | POA: Diagnosis not present

## 2018-01-09 DIAGNOSIS — J22 Unspecified acute lower respiratory infection: Secondary | ICD-10-CM

## 2018-01-09 DIAGNOSIS — R059 Cough, unspecified: Secondary | ICD-10-CM

## 2018-01-09 DIAGNOSIS — J9801 Acute bronchospasm: Secondary | ICD-10-CM | POA: Diagnosis not present

## 2018-01-09 DIAGNOSIS — R0602 Shortness of breath: Secondary | ICD-10-CM

## 2018-01-09 MED ORDER — ALBUTEROL SULFATE (2.5 MG/3ML) 0.083% IN NEBU: 2.5000 mg | INHALATION_SOLUTION | Freq: Once | RESPIRATORY_TRACT | Status: DC

## 2018-01-09 MED ORDER — CEFTRIAXONE SODIUM 1 G IJ SOLR
1.0000 g | Freq: Once | INTRAMUSCULAR | Status: AC
  Administered 2018-01-09: 1 g via INTRAMUSCULAR

## 2018-01-09 MED ORDER — HYDROCODONE-HOMATROPINE 5-1.5 MG/5ML PO SYRP: 5.0000 mL | ORAL_SOLUTION | Freq: Three times a day (TID) | ORAL | 0 refills | Status: DC | PRN

## 2018-01-09 MED ORDER — PREDNISONE 10 MG PO TABS: ORAL_TABLET | ORAL | 0 refills | Status: DC

## 2018-01-09 MED ORDER — DOXYCYCLINE HYCLATE 100 MG PO TABS: 100.0000 mg | ORAL_TABLET | Freq: Two times a day (BID) | ORAL | 0 refills | Status: DC

## 2018-01-09 MED ORDER — IPRATROPIUM-ALBUTEROL 0.5-2.5 (3) MG/3ML IN SOLN
3.0000 mL | Freq: Once | RESPIRATORY_TRACT | Status: AC
  Administered 2018-01-09: 3 mL via RESPIRATORY_TRACT

## 2018-01-09 NOTE — Progress Notes (Signed)
   Subjective:    Patient ID: [name], female    DOB: April 12, 1946, 72 y.o.   MRN: [id number]  HPI 72 year old Female with history of nonischemic cardiomyopathy and history of asthmatic bronchitis in today with cough and wheezing that had onset yesterday.  No fever that she is aware.  Does not have a thermometer at home.  No shaking chills.  She has audible wheezing in the office.    Review of Systems see above     Objective:   Physical Exam Pulse oximetry is 88%-increased to 92% after albuterol treatment and increased to 96% after second albuterol treatment.  Then decreased back to 92%.  Skin warm and dry.  Nodes none.  TMs and pharynx are clear.  Neck is supple.  She has bilateral rhonchi and wheezing.  She coughed up discolored sputum in the office.       Assessment & Plan:  Hypoxia  Acute bronchospasm  Acute bronchitis  Plan: Chest x-ray to see if she has infiltrate.  Rocephin 1 g IM.  Prednisone 10 mg (#21) going from 60 mg to 0 mg over 7 days.  Hycodan 1 teaspoon p.o. every 8 hours as needed cough.  Albuterol inhaler 2 sprays p.o. 4 times daily.  Sample of Symbicort inhaler 160/4.52 sprays p.o. every 12 hours.  Follow-up tomorrow.

## 2018-01-09 NOTE — Patient Instructions (Signed)
Rocephin 1 g IM.  Doxycycline 100 mg twice daily for 10 days.  Prednisone and tapering course going from 60 mg to 0 mg over 7 days.  Albuterol inhaler 2 sprays p.o. 4 times daily.  Symbicort inhaler 2 sprays p.o. every 12 hours.  Follow-up tomorrow.  Have chest x-ray.

## 2018-01-10 ENCOUNTER — Ambulatory Visit (INDEPENDENT_AMBULATORY_CARE_PROVIDER_SITE_OTHER): Payer: Medicare Other | Admitting: Internal Medicine

## 2018-01-10 ENCOUNTER — Encounter: Payer: Self-pay | Admitting: Internal Medicine

## 2018-01-10 VITALS — BP 98/62 | HR 88 | Temp 98.1°F | Ht 59.0 in | Wt 198.0 lb

## 2018-01-10 DIAGNOSIS — R0602 Shortness of breath: Secondary | ICD-10-CM

## 2018-01-10 DIAGNOSIS — R059 Cough, unspecified: Secondary | ICD-10-CM

## 2018-01-10 DIAGNOSIS — J9801 Acute bronchospasm: Secondary | ICD-10-CM | POA: Diagnosis not present

## 2018-01-10 DIAGNOSIS — R05 Cough: Secondary | ICD-10-CM

## 2018-01-10 DIAGNOSIS — J22 Unspecified acute lower respiratory infection: Secondary | ICD-10-CM

## 2018-01-10 MED ORDER — CEFTRIAXONE SODIUM 1 G IJ SOLR
1.0000 g | Freq: Once | INTRAMUSCULAR | Status: AC
  Administered 2018-01-10: 1 g via INTRAMUSCULAR

## 2018-01-10 MED ORDER — IPRATROPIUM-ALBUTEROL 0.5-2.5 (3) MG/3ML IN SOLN
3.0000 mL | Freq: Once | RESPIRATORY_TRACT | Status: AC
  Administered 2018-01-10: 3 mL via RESPIRATORY_TRACT

## 2018-01-10 NOTE — Progress Notes (Signed)
   Subjective:    Patient ID: [name], female    DOB: [birth year], 72 y.o.   MRN: [id number]  HPI Says she slept better last night and she did the previous night.  Still has coughing and wheezing.  No fever or chills.  Apparently the symptoms started after she went to the [employer] and had to walk a long ways and then subsequently played golf last week.  She is still bringing up discolored sputum which I sent for culture and Gram stain today.  She does have a home nebulizer machine but she has not been using it.  Of asked her to use it up to 4 times daily.  Still looks fatigued.   Review of Systems see above-her chest x-ray yesterday showed mild cardiomegaly and central pulmonary vascular congestion slightly more conspicuous than previous films.  She had no pneumonia or pulmonary edema.     Objective:   Physical Exam Still has bilateral rhonchi and wheezing but not as tight as yesterday.  Pulse oximetry ranges between 88% and 92%  Received one nebulizer treatment today in office     Assessment & Plan:  Acute bronchitis  Acute bronchospasm  Plan: 1 g IM Rocephin given today.  Continue steroids antibiotics and inhalers as previously prescribed.  Follow-up on Friday.  25 minutes spent with patient

## 2018-01-10 NOTE — Patient Instructions (Signed)
Rocephin 1 g IM given today.  Continue steroid taper.  Use albuterol nebulizer treatments at home and continue inhalers.

## 2018-01-11 ENCOUNTER — Encounter: Payer: Self-pay | Admitting: Cardiovascular Disease

## 2018-01-11 ENCOUNTER — Ambulatory Visit (INDEPENDENT_AMBULATORY_CARE_PROVIDER_SITE_OTHER): Payer: Medicare Other | Admitting: Cardiovascular Disease

## 2018-01-11 VITALS — BP 140/76 | HR 87 | Ht 59.0 in | Wt 196.0 lb

## 2018-01-11 DIAGNOSIS — I10 Essential (primary) hypertension: Secondary | ICD-10-CM

## 2018-01-11 DIAGNOSIS — I5042 Chronic combined systolic (congestive) and diastolic (congestive) heart failure: Secondary | ICD-10-CM | POA: Diagnosis not present

## 2018-01-11 LAB — GRAM STAIN
MICRO NUMBER:: 90524871
SPECIMEN QUALITY: ADEQUATE

## 2018-01-11 NOTE — Progress Notes (Signed)
Cardiology Office Note   Date:  01/11/2018   ID:  Gabrielle, [name] 14-Sep-1945, MRN [id number]  PCP:  Elby Showers, MD  Cardiologist: previous [name] patient , now  Gabrielle Moores, MD   Chief Complaint  Patient presents with  . Congestive Heart Failure   Problem List 1. Chronic systolic congestive heart failure 2. ICD 3. Hypothyroidism     [name] is a 72 y.o. female who presents today to follow up cardiomyopathy. She is doing very well. She is not having any chest pain or shortness of breath. She's had no syncope or presyncope. Her ICD is followed carefully by Dr. Lovena Le. I saw her last October, 2015. She had an echo shortly after that. Her EF was in the 30-35% range. This is slightly better than it had been. We have her on the maximum doses of medicines that she can tolerate.  December 09, 2015:  Doing well.    Former patient of Dr. Ron [name].  No CP ,  Chronic DOE with activity .  No dyspnea with sitting or lying down .   Jan. 8, 2018:  Doing well.  Mild dyspnea when she exerts herself , no CP   Aug. 22, 2018: She has a history of  chronic systolic congestive heart failure. Her last echocardiogram was in 2015 which shows an ejection fraction of 30-35%. She has a defibrillator. Has some dyspnea when walking  Is on Coreg 25 BID, Losartan 25 mg a day , Lasix  She was on Spironolactone but this was stopped when she developed some renal insufficiency while on valsartan plus Spironolactone  Has started doing water aeorbics  Jan 11, 2018:  Previous [name] patient  Hx of CHF ,   Has an ICD  BP is mildly elevated here.   Has been diagnosed with pneumonia by Dr. Renold Genta  - has been started on Abx.  Has prednisone, doxycycline, nebs    Past Medical History:  Diagnosis Date  . Automatic implantable cardioverter-defibrillator in situ    Dr. Beckie Salts follows-next visit- [phone]  . Cataract   . Dyslipidemia   . Ejection fraction < 50%    EF 25-30%, September, 2010  . GERD  (gastroesophageal reflux disease)   . Hypothyroidism   . ICD (implantable cardiac defibrillator) in place    10/2009; Dr. Lovena Le  . LBBB (left bundle branch block)    old  . Mitral regurgitation    Mild, echo, September, 2010  . Nonischemic cardiomyopathy ([place])    Normal coronary arteries, catheterization, September, 2009  . Overweight(278.02)   . Systolic CHF, chronic (HCC)    no recent problems    Past Surgical History:  Procedure Laterality Date  . CARDIAC CATHETERIZATION     '09 last  . [place]    . COLONOSCOPY N/A 11/29/2013   Procedure: COLONOSCOPY;  Surgeon: Jerene Bears, MD;  Location: [facility];  Service: Gastroenterology;  Laterality: N/A;  . COLONOSCOPY N/A 02/25/2015   Procedure: COLONOSCOPY;  Surgeon: Jerene Bears, MD;  Location: [facility];  Service: Gastroenterology;  Laterality: N/A;  . EYE SURGERY Left    x 3-"droopy eyelid"  . heart catherization    . TONSILLECTOMY  1952    Patient Active Problem List   Diagnosis Date Noted  . Chronic systolic CHF (congestive heart failure) ([place])     Priority: High  . Impaired glucose tolerance 06/21/2017  . Cough variant asthma  vs UACS from ACEi  12/31/2016  .  Essential hypertension 12/31/2016  . Morbid obesity due to excess calories ([name]) 12/31/2016  . History of colonic polyps   . Benign neoplasm of descending colon   . Diverticulosis of colon without hemorrhage 01/28/2014  . Serrated adenoma of colon 01/28/2014  . Nonspecific abnormal finding in stool contents 11/29/2013  . Colon cancer screening 11/29/2013  . History of depression 08/11/2012  . Systolic CHF, chronic ([name])   . LBBB (left bundle branch block)   . Automatic implantable cardioverter-defibrillator in situ   . Hypothyroidism   . GERD (gastroesophageal reflux disease)   . Dyslipidemia   . Mitral regurgitation   . Overweight   . Ejection fraction < 50%       Current Outpatient Medications  Medication Sig Dispense  Refill  . albuterol (PROVENTIL HFA;VENTOLIN HFA) 108 (90 Base) MCG/ACT inhaler Inhale 2 puffs into the lungs every 6 (six) hours as needed for wheezing or shortness of breath. 1 Inhaler prn  . aspirin 81 MG tablet Take 81 mg by mouth every morning.     . carvedilol (COREG) 25 MG tablet Take 1 tablet (25 mg total) by mouth 2 (two) times daily. 180 tablet 2  . Cholecalciferol (VITAMIN D PO) Take 2 tablets by mouth every morning.     Marland Kitchen doxycycline (VIBRA-TABS) 100 MG tablet Take 1 tablet (100 mg total) by mouth 2 (two) times daily. 20 tablet 0  . folic acid (FOLVITE) 1 MG tablet Take 1 tablet (1 mg total) by mouth daily. 90 tablet 3  . furosemide (LASIX) 40 MG tablet TAKE 1 TABLET BY MOUTH DAILY. 30 tablet 8  . glucose blood (ACCU-CHEK AVIVA) test strip Check daily.  For Diabetes 250.00 100 each 12  . HYDROcodone-homatropine (HYCODAN) 5-1.5 MG/5ML syrup Take 5 mLs by mouth every 8 (eight) hours as needed for cough. 120 mL 0  . ipratropium-albuterol (DUONEB) 0.5-2.5 (3) MG/3ML SOLN Take 3 mLs by nebulization every 4 (four) hours as needed (shortness of breath or wheezing).    . Lancets MISC Check glucose daily.  Diabetes 250.00 100 each 11  . levothyroxine (SYNTHROID, LEVOTHROID) 150 MCG tablet Take 1 tablet (150 mcg total) by mouth daily. 90 tablet 0  . metFORMIN (GLUCOPHAGE) 500 MG tablet Take 500 mg by mouth 2 (two) times daily with a meal.    . Multiple Vitamin (MULTIVITAMIN WITH MINERALS) TABS tablet Take 1 tablet by mouth every morning. chewable    . omeprazole (PRILOSEC) 20 MG capsule TAKE ONE CAPSULE BY MOUTH EVERY DAY 90 capsule 3  . predniSONE (DELTASONE) 10 MG tablet TAKE IN TAPERING COURSE 6,5,4,3,2,1 21 tablet 0  . losartan (COZAAR) 25 MG tablet Take 1 tablet (25 mg total) by mouth daily. 90 tablet 3   No current facility-administered medications for this visit.     Allergies:   Patient has no known allergies.    Social History:  The patient  reports that she has never smoked. She  has never used smokeless tobacco. She reports that she drinks alcohol. She reports that she does not use drugs.   Family History:  The patient's family history includes Cancer in her father; Heart attack in her mother; Heart disease in her mother; Hypertension in her mother; Lung cancer in her father; Pancreatic cancer in her father; Stroke in her paternal grandfather.    ROS   Physical Exam: Blood pressure 140/76, pulse 87, height 4\' 11"  (1.499 m), weight 196 lb (88.9 kg), SpO2 91 %.  GEN:   Elderly , moderately obese  female,  Moderate respiratory distress.  HEENT: [place] NECK: No JVD; No carotid bruits LYMPHATICS: No lymphadenopathy CARDIAC: RRR  no murmurs, rubs, gallops RESPIRATORY:   Diffuse wheezing and rhonchi  ABDOMEN: Soft, non-tender, non-distended MUSCULOSKELETAL:  No edema; No deformity  SKIN: Warm and dry NEUROLOGIC:  Alert and oriented x 3     EKG:   Jan 11, 2018: Normal sinus rhythm at 87.  Left bundle branch block.  Recent Labs: 05/03/2017: ALT 11; BUN 15; Creat 0.88; Hemoglobin 10.9; Platelets 349; Potassium 3.8; Sodium 143 11/08/2017: TSH 2.55    Lipid Panel    Component Value Date/Time   CHOL 185 05/03/2017 1021   TRIG 77 05/03/2017 1021   HDL 58 05/03/2017 1021   CHOLHDL 3.2 05/03/2017 1021   VLDL 15 05/03/2017 1021   LDLCALC 112 (H) 05/03/2017 1021      Wt Readings from Last 3 Encounters:  01/11/18 196 lb (88.9 kg)  01/10/18 198 lb (89.8 kg)  01/09/18 198 lb (89.8 kg)      Current medicines are reviewed  The patient understands her medications.     ASSESSMENT AND PLAN:  1. Chronic systolic congestive heart failure - continue current meds .   She has an EF of 30-35%.   Has an ICD.  Is been on her medications.  We will anticipate getting an echocardiogram before I see her again in 6 months when office visit.  2. ICD - plans per Dr. Lovena Le   3. Hypothyroidism - followed by her primary   4.  Upper respiratory tract infection: Patient is  thought to have pneumonia.  She has received antibiotics, prednisone, nebulizers from Dr. Renold Genta.  Follow with Dr. Renold Genta for this    Gabrielle Moores, MD  01/11/2018 5:04 PM    [place],  [address] [place], [place]  [address] Pager [phone] Phone: [phone]; Fax: [phone]

## 2018-01-11 NOTE — Patient Instructions (Addendum)
Medication Instructions:  Your physician recommends that you continue on your current medications as directed. Please refer to the Current Medication list given to you today.   Labwork: None Ordered   Testing/Procedures: Your physician has requested that you have an echocardiogram in 6 months. Echocardiography is a painless test that uses sound waves to create images of your heart. It provides your doctor with information about the size and shape of your heart and how well your heart's chambers and valves are working. This procedure takes approximately one hour. There are no restrictions for this procedure.   Follow-Up: Your physician wants you to follow-up in: 6 months with Dr. Acie Fredrickson after your echocardiogram. You will receive a reminder letter in the mail two months in advance. If you don't receive a letter, please call our office to schedule the follow-up appointment.   If you need a refill on your cardiac medications before your next appointment, please call your pharmacy.   Thank you for choosing [facility]! Christen Bame, RN [phone]

## 2018-01-13 ENCOUNTER — Ambulatory Visit (INDEPENDENT_AMBULATORY_CARE_PROVIDER_SITE_OTHER): Payer: Medicare Other | Admitting: Internal Medicine

## 2018-01-13 VITALS — BP 100/70 | HR 76 | Temp 98.5°F | Ht 59.0 in | Wt 191.0 lb

## 2018-01-13 DIAGNOSIS — E039 Hypothyroidism, unspecified: Secondary | ICD-10-CM | POA: Diagnosis not present

## 2018-01-13 DIAGNOSIS — R0902 Hypoxemia: Secondary | ICD-10-CM

## 2018-01-13 DIAGNOSIS — I5042 Chronic combined systolic (congestive) and diastolic (congestive) heart failure: Secondary | ICD-10-CM

## 2018-01-13 DIAGNOSIS — R0602 Shortness of breath: Secondary | ICD-10-CM | POA: Diagnosis not present

## 2018-01-13 DIAGNOSIS — J22 Unspecified acute lower respiratory infection: Secondary | ICD-10-CM

## 2018-01-13 MED ORDER — PREDNISONE 10 MG PO TABS: ORAL_TABLET | ORAL | 0 refills | Status: DC

## 2018-01-13 MED ORDER — DOXYCYCLINE HYCLATE 100 MG PO TABS: 100.0000 mg | ORAL_TABLET | Freq: Two times a day (BID) | ORAL | 0 refills | Status: DC

## 2018-01-27 ENCOUNTER — Other Ambulatory Visit: Payer: Self-pay | Admitting: Internal Medicine

## 2018-02-05 ENCOUNTER — Encounter: Payer: Self-pay | Admitting: Internal Medicine

## 2018-02-05 NOTE — Patient Instructions (Signed)
Finish antibiotics and steroids as previously prescribed and continue to use inhalers.  Once respiratory infection has improved try to walk some.

## 2018-02-05 NOTE — Progress Notes (Signed)
   Subjective:    Patient ID: [name], female    DOB: 07/28/1946, 72 y.o.   MRN: [id number]  HPI 72 year old Female in today for follow-up of shortness of breath related to apparent lower respiratory infection.  Was seen April 29 and 30.  Was treated with home nebulizer treatments, Rocephin, steroids, antibiotics and inhalers.  Pulse oximetry was initially 88 and 92%    Review of Systems     Objective:   Physical Exam Pulse oximetry today is 91%.  She does get a bit short of breath with exertion.  Does have a history of combined systolic and diastolic heart failure.  She looks less tachypnea.  Her weight is 191 pounds and on April 29 was 198 pounds.  She has an ejection fraction of 30 to 35% per cardiologist.  She has an ICD.  Chest x-ray on April 29 showed no focal infiltrate or pleural effusion but prominent central pulmonary vascularity.  She had mild cardiomegaly.  We did not treat her with Lasix. Today her chest is clear to auscultation.      Assessment & Plan:  Shortness of breath has improved  Lower respiratory infection improving  Plan: Continue treatments as previously prescribed.  Follow-up as needed.  Patient needs to lose weight and needs to get on a regular walking program

## 2018-02-20 ENCOUNTER — Ambulatory Visit (INDEPENDENT_AMBULATORY_CARE_PROVIDER_SITE_OTHER): Payer: Medicare Other | Admitting: *Deleted

## 2018-02-20 DIAGNOSIS — I428 Other cardiomyopathies: Secondary | ICD-10-CM

## 2018-02-20 NOTE — Progress Notes (Signed)
Remote ICD transmission.   

## 2018-02-21 ENCOUNTER — Encounter: Payer: Self-pay | Admitting: Cardiology

## 2018-02-23 ENCOUNTER — Ambulatory Visit (INDEPENDENT_AMBULATORY_CARE_PROVIDER_SITE_OTHER): Payer: Medicare Other | Admitting: Internal Medicine

## 2018-02-23 ENCOUNTER — Encounter: Payer: Self-pay | Admitting: Internal Medicine

## 2018-02-23 VITALS — BP 114/70 | HR 74 | Ht 59.0 in | Wt 197.4 lb

## 2018-02-23 DIAGNOSIS — I10 Essential (primary) hypertension: Secondary | ICD-10-CM

## 2018-02-23 DIAGNOSIS — Z95810 Presence of automatic (implantable) cardiac defibrillator: Secondary | ICD-10-CM

## 2018-02-23 DIAGNOSIS — I5022 Chronic systolic (congestive) heart failure: Secondary | ICD-10-CM | POA: Diagnosis not present

## 2018-02-23 NOTE — Patient Instructions (Signed)

## 2018-02-23 NOTE — Progress Notes (Addendum)
HPI Ms. Zeimet returns today for followup. She is a very pleasant 72 year old woman with a nonischemic cardiomyopathy, chronic class II systolic heart failure, obesity, status post ICD implantation. In the interim, she has she has done well otherwise and has not had any cough since stopping the ramipril. No ICD shocks. She wants to start exercising.   No Known Allergies   Current Outpatient Medications  Medication Sig Dispense Refill  . albuterol (PROVENTIL HFA;VENTOLIN HFA) 108 (90 Base) MCG/ACT inhaler Inhale 2 puffs into the lungs every 6 (six) hours as needed for wheezing or shortness of breath. 1 Inhaler prn  . aspirin 81 MG tablet Take 81 mg by mouth every morning.     . carvedilol (COREG) 25 MG tablet Take 1 tablet (25 mg total) by mouth 2 (two) times daily. 180 tablet 2  . Cholecalciferol (VITAMIN D PO) Take 2 tablets by mouth every morning.     Marland Kitchen doxycycline (VIBRA-TABS) 100 MG tablet Take 1 tablet (100 mg total) by mouth 2 (two) times daily. 20 tablet 0  . folic acid (FOLVITE) 1 MG tablet Take 1 tablet (1 mg total) by mouth daily. 90 tablet 3  . furosemide (LASIX) 40 MG tablet TAKE 1 TABLET BY MOUTH DAILY. 30 tablet 8  . glucose blood (ACCU-CHEK AVIVA) test strip Check daily.  For Diabetes 250.00 100 each 12  . ipratropium-albuterol (DUONEB) 0.5-2.5 (3) MG/3ML SOLN Take 3 mLs by nebulization every 4 (four) hours as needed (shortness of breath or wheezing).    . Lancets MISC Check glucose daily.  Diabetes 250.00 100 each 11  . levothyroxine (SYNTHROID, LEVOTHROID) 150 MCG tablet Take 1 tablet (150 mcg total) by mouth daily. 90 tablet 0  . losartan (COZAAR) 25 MG tablet TAKE 1 TABLET BY MOUTH EVERY DAY 90 tablet 0  . metFORMIN (GLUCOPHAGE) 500 MG tablet Take 500 mg by mouth 2 (two) times daily with a meal.    . Multiple Vitamin (MULTIVITAMIN WITH MINERALS) TABS tablet Take 1 tablet by mouth every morning. chewable    . omeprazole (PRILOSEC) 20 MG capsule TAKE ONE CAPSULE BY  MOUTH EVERY DAY 90 capsule 3   No current facility-administered medications for this visit.      Past Medical History:  Diagnosis Date  . Automatic implantable cardioverter-defibrillator in situ    Dr. Beckie Salts follows-next visit- [phone]  . Cataract   . Dyslipidemia   . Ejection fraction < 50%    EF 25-30%, September, 2010  . GERD (gastroesophageal reflux disease)   . Hypothyroidism   . ICD (implantable cardiac defibrillator) in place    10/2009; Dr. Lovena Le  . LBBB (left bundle branch block)    old  . Mitral regurgitation    Mild, echo, September, 2010  . Nonischemic cardiomyopathy ([place])    Normal coronary arteries, catheterization, September, 2009  . Overweight(278.02)   . Systolic CHF, chronic (HCC)    no recent problems    ROS:   All systems reviewed and negative except as noted in the HPI.   Past Surgical History:  Procedure Laterality Date  . CARDIAC CATHETERIZATION     '09 last  . [place]    . COLONOSCOPY N/A 11/29/2013   Procedure: COLONOSCOPY;  Surgeon: Jerene Bears, MD;  Location: [facility];  Service: Gastroenterology;  Laterality: N/A;  . COLONOSCOPY N/A 02/25/2015   Procedure: COLONOSCOPY;  Surgeon: Jerene Bears, MD;  [facility];  Service: Gastroenterology;  Laterality: N/A;  .  EYE SURGERY Left    x 3-"droopy eyelid"  . heart catherization    . TONSILLECTOMY  1952     Family History  Problem Relation Age of Onset  . Heart attack Mother   . Heart disease Mother   . Hypertension Mother   . Lung cancer Father   . Pancreatic cancer Father   . Cancer Father   . Stroke Paternal Grandfather   . Colon cancer Neg Hx   . Diabetes Neg Hx   . Kidney disease Neg Hx   . Liver disease Neg Hx      Social History   Socioeconomic History  . Marital status: Single    Spouse name: Not on file  . Number of children: Not on file  . Years of education: Not on file  . Highest education level: Not on file  Occupational  History  . Not on file  Social Needs  . Financial resource strain: Not on file  . Food insecurity:    Worry: Not on file    Inability: Not on file  . Transportation needs:    Medical: Not on file    Non-medical: Not on file  Tobacco Use  . Smoking status: Never Smoker  . Smokeless tobacco: Never Used  Substance and Sexual Activity  . Alcohol use: Yes    Comment: 2-4 beers per week  . Drug use: No  . Sexual activity: Never    Birth control/protection: Abstinence  Lifestyle  . Physical activity:    Days per week: Not on file    Minutes per session: Not on file  . Stress: Not on file  Relationships  . Social connections:    Talks on phone: Not on file    Gets together: Not on file    Attends religious service: Not on file    Active member of club or organization: Not on file    Attends meetings of clubs or organizations: Not on file    Relationship status: Not on file  . Intimate partner violence:    Fear of current or ex partner: Not on file    Emotionally abused: Not on file    Physically abused: Not on file    Forced sexual activity: Not on file  Other Topics Concern  . Not on file  Social History Narrative  . Not on file     BP 114/70   Pulse 74   Ht 4\' 11"  (1.499 m)   Wt 197 lb 6.4 oz (89.5 kg)   SpO2 97%   BMI 39.87 kg/m   Physical Exam:  Well appearing 72 yo overweight woman, NAD HEENT: Unremarkable Neck:  6 cm JVD, no thyromegally Lymphatics:  No adenopathy Back:  No CVA tenderness Lungs:  Clear with no wheezes HEART:  Regular rate rhythm, no murmurs, no rubs, no clicks Abd:  soft, positive bowel sounds, no organomegally, no rebound, no guarding Ext:  2 plus pulses, no edema, no cyanosis, no clubbing Skin:  No rashes no nodules Neuro:  CN II through XII intact, motor grossly intact  EKG - NSR   DEVICE  Normal device function.  See PaceArt for details.   Assess/Plan: 1. Chronic systolic heart failure - she is class 2. She will continue her  current meds. I have encouraged the patient to start exercising but not to over due it. No changes in her meds today. 2. Obesity - she is morbidly obese. She is encouraged to lose weight. 3. ICD - her [place]  [employer] DDD ICD is working normally. Will follow. 4. VT - she has only had occaisional and brief NSVT. No indication for AA drug therapy at this point.   Mikle Bosworth.D.

## 2018-02-27 LAB — CUP PACEART INCLINIC DEVICE CHECK
Date Time Interrogation Session: 20190617122936
Implantable Lead Implant Date: 20110221
Implantable Lead Location: 753859
Implantable Lead Location: 753860
Implantable Lead Model: 157
Implantable Lead Model: 5076
Implantable Lead Serial Number: 301957
Lead Channel Setting Pacing Amplitude: 2.4 V
Lead Channel Setting Pacing Pulse Width: 0.4 ms
Lead Channel Setting Sensing Sensitivity: 0.6 mV
MDC IDC LEAD IMPLANT DT: 20110221
MDC IDC PG IMPLANT DT: 20110221
MDC IDC SET LEADCHNL RA PACING AMPLITUDE: 2 V
Pulse Gen Serial Number: 160860

## 2018-03-12 ENCOUNTER — Other Ambulatory Visit: Payer: Self-pay | Admitting: Internal Medicine

## 2018-03-17 LAB — CUP PACEART REMOTE DEVICE CHECK
Battery Remaining Percentage: 57 %
HighPow Impedance: 54 Ohm
Implantable Lead Implant Date: 20110221
Implantable Lead Model: 5076
Implantable Pulse Generator Implant Date: 20110221
Lead Channel Impedance Value: 464 Ohm
Lead Channel Setting Pacing Amplitude: 2 V
Lead Channel Setting Pacing Amplitude: 2.4 V
Lead Channel Setting Pacing Pulse Width: 0.4 ms
Lead Channel Setting Sensing Sensitivity: 0.6 mV
MDC IDC LEAD IMPLANT DT: 20110221
MDC IDC LEAD LOCATION: 753859
MDC IDC LEAD LOCATION: 753860
MDC IDC LEAD SERIAL: 301957
MDC IDC MSMT BATTERY REMAINING LONGEVITY: 48 mo
MDC IDC MSMT LEADCHNL RA PACING THRESHOLD AMPLITUDE: 0.5 V
MDC IDC MSMT LEADCHNL RA PACING THRESHOLD PULSEWIDTH: 0.4 ms
MDC IDC MSMT LEADCHNL RV IMPEDANCE VALUE: 636 Ohm
MDC IDC MSMT LEADCHNL RV PACING THRESHOLD AMPLITUDE: 0.5 V
MDC IDC MSMT LEADCHNL RV PACING THRESHOLD PULSEWIDTH: 0.4 ms
MDC IDC SESS DTM: 20190610055100
MDC IDC STAT BRADY RA PERCENT PACED: 0 %
MDC IDC STAT BRADY RV PERCENT PACED: 0 %
Pulse Gen Serial Number: 160860

## 2018-04-27 ENCOUNTER — Other Ambulatory Visit: Payer: Self-pay | Admitting: Cardiovascular Disease

## 2018-04-28 ENCOUNTER — Other Ambulatory Visit: Payer: Self-pay | Admitting: Internal Medicine

## 2018-05-03 ENCOUNTER — Other Ambulatory Visit: Payer: Self-pay | Admitting: Internal Medicine

## 2018-05-03 DIAGNOSIS — Z1231 Encounter for screening mammogram for malignant neoplasm of breast: Secondary | ICD-10-CM

## 2018-05-10 ENCOUNTER — Other Ambulatory Visit: Payer: Self-pay | Admitting: Internal Medicine

## 2018-05-10 DIAGNOSIS — Z95810 Presence of automatic (implantable) cardiac defibrillator: Secondary | ICD-10-CM

## 2018-05-10 DIAGNOSIS — E8881 Metabolic syndrome: Secondary | ICD-10-CM

## 2018-05-10 DIAGNOSIS — K219 Gastro-esophageal reflux disease without esophagitis: Secondary | ICD-10-CM

## 2018-05-10 DIAGNOSIS — Z0000 Encounter for general adult medical examination without abnormal findings: Secondary | ICD-10-CM

## 2018-05-10 DIAGNOSIS — I10 Essential (primary) hypertension: Secondary | ICD-10-CM

## 2018-05-10 DIAGNOSIS — F32A Depression, unspecified: Secondary | ICD-10-CM

## 2018-05-10 DIAGNOSIS — I428 Other cardiomyopathies: Secondary | ICD-10-CM

## 2018-05-10 DIAGNOSIS — E039 Hypothyroidism, unspecified: Secondary | ICD-10-CM

## 2018-05-10 DIAGNOSIS — R7302 Impaired glucose tolerance (oral): Secondary | ICD-10-CM

## 2018-05-10 DIAGNOSIS — E7800 Pure hypercholesterolemia, unspecified: Secondary | ICD-10-CM

## 2018-05-10 DIAGNOSIS — M8580 Other specified disorders of bone density and structure, unspecified site: Secondary | ICD-10-CM

## 2018-05-10 DIAGNOSIS — F329 Major depressive disorder, single episode, unspecified: Secondary | ICD-10-CM

## 2018-05-10 DIAGNOSIS — F419 Anxiety disorder, unspecified: Secondary | ICD-10-CM

## 2018-05-12 ENCOUNTER — Other Ambulatory Visit: Payer: Medicare Other | Admitting: Internal Medicine

## 2018-05-12 DIAGNOSIS — K219 Gastro-esophageal reflux disease without esophagitis: Secondary | ICD-10-CM | POA: Diagnosis not present

## 2018-05-12 DIAGNOSIS — R7302 Impaired glucose tolerance (oral): Secondary | ICD-10-CM | POA: Diagnosis not present

## 2018-05-12 DIAGNOSIS — F329 Major depressive disorder, single episode, unspecified: Secondary | ICD-10-CM | POA: Diagnosis not present

## 2018-05-12 DIAGNOSIS — F419 Anxiety disorder, unspecified: Secondary | ICD-10-CM | POA: Diagnosis not present

## 2018-05-12 DIAGNOSIS — M8580 Other specified disorders of bone density and structure, unspecified site: Secondary | ICD-10-CM | POA: Diagnosis not present

## 2018-05-12 DIAGNOSIS — E7800 Pure hypercholesterolemia, unspecified: Secondary | ICD-10-CM

## 2018-05-12 DIAGNOSIS — E8881 Metabolic syndrome: Secondary | ICD-10-CM | POA: Diagnosis not present

## 2018-05-12 DIAGNOSIS — Z95810 Presence of automatic (implantable) cardiac defibrillator: Secondary | ICD-10-CM

## 2018-05-12 DIAGNOSIS — I428 Other cardiomyopathies: Secondary | ICD-10-CM

## 2018-05-12 DIAGNOSIS — F32A Depression, unspecified: Secondary | ICD-10-CM

## 2018-05-12 DIAGNOSIS — I10 Essential (primary) hypertension: Secondary | ICD-10-CM

## 2018-05-12 DIAGNOSIS — E039 Hypothyroidism, unspecified: Secondary | ICD-10-CM | POA: Diagnosis not present

## 2018-05-12 DIAGNOSIS — Z0000 Encounter for general adult medical examination without abnormal findings: Secondary | ICD-10-CM | POA: Diagnosis not present

## 2018-05-13 LAB — COMPLETE METABOLIC PANEL WITH GFR
AG RATIO: 1.6 (calc) (ref 1.0–2.5)
ALT: 8 U/L (ref 6–29)
AST: 10 U/L (ref 10–35)
Albumin: 3.8 g/dL (ref 3.6–5.1)
Alkaline phosphatase (APISO): 73 U/L (ref 33–130)
BUN: 16 mg/dL (ref 7–25)
CALCIUM: 9.1 mg/dL (ref 8.6–10.4)
CO2: 35 mmol/L — AB (ref 20–32)
Chloride: 100 mmol/L (ref 98–110)
Creat: 0.82 mg/dL (ref 0.60–0.93)
GFR, EST NON AFRICAN AMERICAN: 72 mL/min/{1.73_m2} (ref >=60)
GFR, Est African American: 83 mL/min/{1.73_m2} (ref >=60)
Globulin: 2.4 g/dL (calc) (ref 1.9–3.7)
Glucose, Bld: 102 mg/dL — ABNORMAL HIGH (ref 65–99)
POTASSIUM: 3.8 mmol/L (ref 3.5–5.3)
Sodium: 143 mmol/L (ref 135–146)
Total Bilirubin: 1.1 mg/dL (ref 0.2–1.2)
Total Protein: 6.2 g/dL (ref 6.1–8.1)

## 2018-05-13 LAB — CBC WITH DIFFERENTIAL/PLATELET
BASOS PCT: 1 %
Basophils Absolute: 42 cells/uL (ref 0–200)
EOS ABS: 92 {cells}/uL (ref 15–500)
EOS PCT: 2.2 %
HCT: 29 % — ABNORMAL LOW (ref 35.0–45.0)
HEMOGLOBIN: 9.7 g/dL — AB (ref 11.7–15.5)
Lymphs Abs: 1819 cells/uL (ref 850–3900)
MCH: 33.9 pg — ABNORMAL HIGH (ref 27.0–33.0)
MCHC: 33.4 g/dL (ref 32.0–36.0)
MCV: 101.4 fL — ABNORMAL HIGH (ref 80.0–100.0)
MONOS PCT: 7 %
MPV: 10.4 fL (ref 7.5–12.5)
NEUTROS ABS: 1953 {cells}/uL (ref 1500–7800)
Neutrophils Relative %: 46.5 %
Platelets: 333 10*3/uL (ref 140–400)
RBC: 2.86 10*6/uL — AB (ref 3.80–5.10)
RDW: 17.3 % — ABNORMAL HIGH (ref 11.0–15.0)
Total Lymphocyte: 43.3 %
WBC mixed population: 294 cells/uL (ref 200–950)
WBC: 4.2 10*3/uL (ref 3.8–10.8)

## 2018-05-13 LAB — HEMOGLOBIN A1C
EAG (MMOL/L): 6.8 (calc)
HEMOGLOBIN A1C: 5.9 %{Hb} — AB (ref <5.7)
MEAN PLASMA GLUCOSE: 123 (calc)

## 2018-05-13 LAB — MICROALBUMIN / CREATININE URINE RATIO
Creatinine, Urine: 13 mg/dL — ABNORMAL LOW (ref 20–275)
Microalb, Ur: <0.2 mg/dL

## 2018-05-13 LAB — TSH: TSH: 0.4 mIU/L (ref 0.40–4.50)

## 2018-05-13 LAB — LIPID PANEL
CHOL/HDL RATIO: 3.4 (calc) (ref <5.0)
Cholesterol: 165 mg/dL (ref <200)
HDL: 48 mg/dL — AB (ref >50)
LDL Cholesterol (Calc): 101 mg/dL (calc) — ABNORMAL HIGH
NON-HDL CHOLESTEROL (CALC): 117 mg/dL (ref <130)
Triglycerides: 73 mg/dL (ref <150)

## 2018-05-18 ENCOUNTER — Encounter: Payer: Self-pay | Admitting: Internal Medicine

## 2018-05-18 ENCOUNTER — Ambulatory Visit (INDEPENDENT_AMBULATORY_CARE_PROVIDER_SITE_OTHER): Payer: Medicare Other | Admitting: Internal Medicine

## 2018-05-18 VITALS — BP 102/70 | HR 73 | Ht 59.0 in | Wt 198.0 lb

## 2018-05-18 DIAGNOSIS — Z1211 Encounter for screening for malignant neoplasm of colon: Secondary | ICD-10-CM | POA: Diagnosis not present

## 2018-05-18 DIAGNOSIS — Z95810 Presence of automatic (implantable) cardiac defibrillator: Secondary | ICD-10-CM | POA: Diagnosis not present

## 2018-05-18 DIAGNOSIS — F419 Anxiety disorder, unspecified: Secondary | ICD-10-CM

## 2018-05-18 DIAGNOSIS — I10 Essential (primary) hypertension: Secondary | ICD-10-CM

## 2018-05-18 DIAGNOSIS — R7302 Impaired glucose tolerance (oral): Secondary | ICD-10-CM | POA: Diagnosis not present

## 2018-05-18 DIAGNOSIS — M8580 Other specified disorders of bone density and structure, unspecified site: Secondary | ICD-10-CM | POA: Diagnosis not present

## 2018-05-18 DIAGNOSIS — Z0000 Encounter for general adult medical examination without abnormal findings: Secondary | ICD-10-CM | POA: Diagnosis not present

## 2018-05-18 DIAGNOSIS — I5042 Chronic combined systolic (congestive) and diastolic (congestive) heart failure: Secondary | ICD-10-CM | POA: Diagnosis not present

## 2018-05-18 DIAGNOSIS — D539 Nutritional anemia, unspecified: Secondary | ICD-10-CM

## 2018-05-18 DIAGNOSIS — E039 Hypothyroidism, unspecified: Secondary | ICD-10-CM | POA: Diagnosis not present

## 2018-05-18 DIAGNOSIS — S81811A Laceration without foreign body, right lower leg, initial encounter: Secondary | ICD-10-CM | POA: Diagnosis not present

## 2018-05-18 DIAGNOSIS — Z23 Encounter for immunization: Secondary | ICD-10-CM

## 2018-05-18 DIAGNOSIS — L03115 Cellulitis of right lower limb: Secondary | ICD-10-CM | POA: Diagnosis not present

## 2018-05-18 DIAGNOSIS — F32A Depression, unspecified: Secondary | ICD-10-CM

## 2018-05-18 DIAGNOSIS — E8881 Metabolic syndrome: Secondary | ICD-10-CM | POA: Diagnosis not present

## 2018-05-18 DIAGNOSIS — K219 Gastro-esophageal reflux disease without esophagitis: Secondary | ICD-10-CM | POA: Diagnosis not present

## 2018-05-18 DIAGNOSIS — I428 Other cardiomyopathies: Secondary | ICD-10-CM | POA: Diagnosis not present

## 2018-05-18 DIAGNOSIS — F329 Major depressive disorder, single episode, unspecified: Secondary | ICD-10-CM

## 2018-05-18 LAB — POCT URINALYSIS DIPSTICK
APPEARANCE: NORMAL
BILIRUBIN UA: NEGATIVE
Glucose, UA: NEGATIVE
KETONES UA: NEGATIVE
Leukocytes, UA: NEGATIVE
NITRITE UA: NEGATIVE
ODOR: NORMAL
PH UA: 6.5 (ref 5.0–8.0)
PROTEIN UA: POSITIVE — AB
RBC UA: NEGATIVE
Spec Grav, UA: 1.01 (ref 1.010–1.025)
UROBILINOGEN UA: 0.2 U/dL

## 2018-05-18 MED ORDER — DOXYCYCLINE HYCLATE 100 MG PO TABS: 100.0000 mg | ORAL_TABLET | Freq: Two times a day (BID) | ORAL | 0 refills | Status: DC

## 2018-05-18 MED ORDER — MUPIROCIN 2 % EX OINT: TOPICAL_OINTMENT | CUTANEOUS | 0 refills | Status: DC

## 2018-05-18 NOTE — Progress Notes (Signed)
Subjective:    Patient ID: [name], female    DOB: Jan 14, 1946, 72 y.o.   MRN: [id number]  HPI 72 year old Female for health maintenance exam, Medicare wellness and evaluation of medical issues.  Recently her dog ran into her right leg about a week ago.  She says she was not bitten but he somehow caused a linear injury with his teeth however it looks a bit like a bite that has become infected.  She has a history of metabolic syndrome, hyperlipidemia, GE reflux, hypothyroidism, nonischemic cardiomyopathy, cardiac defibrillator, mitral regurgitation, left bundle branch block, obesity, hypertension,  history of asthmatic bronchitis and anemia.  History of impaired glucose tolerance.  History of anxiety and depression.  She does not smoke.  Occasionally drinks beer.  She has a living will.  No known drug allergies.    Ophthalmologist is Dr. Herbert Deaner.  She wears glasses.  Dislocated her right shoulder in 2001.  Multiple surgeries as a child.  Tonsillectomy at age 42.  History of allergic rhinitis.  Social history: She is retired.  Previously worked at [employer] and subsequently at [employer].  She may work for there part-time during the Christmas holidays.  She is single.  Completed 4 years of college.  Family history: Father died of lung cancer in his 58s.  Mother died at age 12 of an MI with history of possible diabetes.  No siblings.  No children.  We had issues with getting her thyroid replacement medication correct as she presents from time to time with elevated TSH levels.  She denies noncompliance with medication.  History of mild anemia in 2018 10.9 g.  This is a macrocytic anemia.  MCV was 103.7.  Her B12 levels are normal as well as folate levels.  She had a colonoscopy in 2016.  We have had her smear reviewed by pathology.  I recommended evaluation for vitamin deficiency.  Also check for iron deficiency.  Iron level was 96.  Ferritin was 38.   Hemoglobin is now 9.6 g.  She denies any melena or GI bleeding.  We are going to refer her to gastroenterologist and if there is no evidence of GI bleeding then on to hematology to rule out myelodysplastic process.  This was discussed at length today.  B12 level is 702.  Folate is 20.4.     Review of Systems  HENT: Negative.   Respiratory: Negative.   Cardiovascular: Negative.   Genitourinary: Negative.   Neurological: Negative.    Tetanus given 2012.  No other new concerns from patient     Objective:   Physical Exam  Constitutional: She is oriented to person, place, and time. She appears well-developed and well-nourished. No distress.  HENT:  Head: Normocephalic.  Eyes: Conjunctivae are normal. Right eye exhibits no discharge. Left eye exhibits no discharge. No scleral icterus.  Neck: No JVD present.  Cardiovascular: Normal rate, normal heart sounds and intact distal pulses.  Pulmonary/Chest: Effort normal. No stridor. No respiratory distress. She has no wheezes.  Genitourinary:  Genitourinary Comments: Bimanual is normal  Neurological: She is alert and oriented to person, place, and time. She displays normal reflexes. No cranial nerve deficit or sensory deficit. She exhibits normal muscle tone. Coordination normal.  Skin: Skin is warm and dry. She is not diaphoretic.  Psychiatric: She has a normal mood and affect. Her behavior is normal. Judgment and thought content normal.  Vitals reviewed.  She has evidence of cellulitis with increased warmth and  erythema right lateral lower extremity below the knee.  There is an injury she reports from her dog where the skin is broken in a linear fashion about 3 cm.  No drainage.  No adenopathy appreciated.  HEENT exam TMs and pharynx are clear.  Neck is supple.  No thyromegaly.  Chest clear to auscultation.  Cardiac exam regular rate and rhythm normal S1 and S2.  Abdomen no hepatosplenomegaly masses or tenderness appreciated.  No lower extremity  edema.  Neurologically she is intact without focal deficits.       Assessment & Plan:  Cellulitis from injury to right lower extremity from her dog.  She denies that it is a dog bite but he broke the skin when he ran into her leg.  She will be treated with doxycycline 100 mg twice daily and follow-up next week.  Bactroban to be applied to broken skin area twice daily.  Tetanus immunization is up-to-date.  Hypothyroidism-continue current dose of thyroid replacement  GE reflux-continue PPI  Macrocytic anemia-referred to gastroenterology.  Given 3 Hemoccult cards.  History of impaired glucose tolerance-hemoglobin A1c 5.9% and previously was 5.6% a year ago  Hyperlipidemia-stable on lipid-lowering medication.  Has low HDL cholesterol 48  Obesity-needs to work on diet exercise and weight loss  Nonischemic cardiomyopathy-followed by cardiologist.  History of combined systolic and diastolic heart failure.  His cardiac defibrillator.  History of mitral regurgitation  History of left bundle branch block  History of allergic rhinitis  History of asthmatic bronchitis and bronchospasm generally with 1 or 2 bouts a year  Plan: referral to gastroenterologist and if no evidence of GI bleeding on to hematology to evaluate for myelodysplasia.  Subjective:   Patient presents for Medicare Annual/Subsequent preventive examination.  Review Past Medical/Family/Social: See above   Risk Factors  Current exercise habits: Walks dog Dietary issues discussed: Low-fat low carbohydrate  Cardiac risk factors: Hyperlipidemia, impaired glucose tolerance, family history  Depression Screen  (Note: if answer to either of the following is "Yes", a more complete depression screening is indicated)   Over the past two weeks, have you felt down, depressed or hopeless? No  Over the past two weeks, have you felt little interest or pleasure in doing things? No Have you lost interest or pleasure in daily life?  No Do you often feel hopeless? No Do you cry easily over simple problems? No   Activities of Daily Living  In your present state of health, do you have any difficulty performing the following activities?:   Driving? No -do not see well at night due to cataracts Managing money? No  Feeding yourself? No  Getting from bed to chair? No  Climbing a flight of stairs?  Yes gets short of breath Preparing food and eating?: No  Bathing or showering? No  Getting dressed: No  Getting to the toilet? No  Using the toilet:No  Moving around from place to place: No  In the past year have you fallen or had a near fall?:No  Are you sexually active? No  Do you have more than one partner? No   Hearing Difficulties: No  Do you often ask people to speak up or repeat themselves? No  Do you experience ringing or noises in your ears? No  Do you have difficulty understanding soft or whispered voices?  Yes Do you feel that you have a problem with memory? No Do you often misplace items? No    Home Safety:  Do you have a smoke alarm at  your residence?  No Do you have grab bars in the bathroom?  No Do you have throw rugs in your house?  No   Cognitive Testing  Alert? Yes Normal Appearance?Yes  Oriented to person? Yes Place? Yes  Time? Yes  Recall of three objects? Yes  Can perform simple calculations? Yes  Displays appropriate judgment?Yes  Can read the correct time from a watch face?Yes   List the Names of Other Physician/Practitioners you currently use:  See referral list for the physicians patient is currently seeing.     Review of Systems: See above   Objective:     General appearance: Appears stated age and obese  Head: Normocephalic, without obvious abnormality, atraumatic  Eyes: conj clear, EOMi PEERLA  Ears: normal TM's and external ear canals both ears  Nose: Nares normal. Septum midline. Mucosa normal. No drainage or sinus tenderness.  Throat: lips, mucosa, and tongue normal;  teeth and gums normal  Neck: no adenopathy, no carotid bruit, no JVD, supple, symmetrical, trachea midline and thyroid not enlarged, symmetric, no tenderness/mass/nodules  No CVA tenderness.  Lungs: clear to auscultation bilaterally  Breasts: normal appearance, no masses or tenderness, top of the pacemaker on left upper chest. Heart: regular rate and rhythm, S1, S2 normal, no murmur, click, rub or gallop  Abdomen: soft, non-tender; bowel sounds normal; no masses, no organomegaly  Musculoskeletal: ROM normal in all joints, no crepitus, no deformity, Normal muscle strengthen. Back  is symmetric, no curvature. Skin: Skin color, texture, turgor normal. No rashes or lesions  Lymph nodes: Cervical, supraclavicular, and axillary nodes normal.  Neurologic: CN 2 -12 Normal, Normal symmetric reflexes. Normal coordination and gait  Psych: Alert & Oriented x 3, Mood appear stable.    Assessment:    Annual wellness medicare exam   Plan:    During the course of the visit the patient was educated and counseled about appropriate screening and preventive services including:   Recommend annual mammogram and annual flu vaccine     Patient Instructions (the written plan) was given to the patient.  Medicare Attestation  I have personally reviewed:  The patient's medical and social history  Their use of alcohol, tobacco or illicit drugs  Their current medications and supplements  The patient's functional ability including ADLs,fall risks, home safety risks, cognitive, and hearing and visual impairment  Diet and physical activities  Evidence for depression or mood disorders  The patient's weight, height, BMI, and visual acuity have been recorded in the chart. I have made referrals, counseling, and provided education to the patient based on review of the above and I have provided the patient with a written personalized care plan for preventive services.

## 2018-05-18 NOTE — Patient Instructions (Addendum)
Wash right leg with warm soapy water. Treat with peroxide then Bactroban ointment.  Doxycycline twice daily as directed.  Anemia studies pending.  GI referral for anemia.  Continue current medications as prescribed

## 2018-05-19 LAB — IRON,TIBC AND FERRITIN PANEL
%SAT: 29 % (ref 16–45)
Ferritin: 38 ng/mL (ref 16–288)
IRON: 96 ug/dL (ref 45–160)
TIBC: 327 ug/dL (ref 250–450)

## 2018-05-19 LAB — VITAMIN B12: VITAMIN B 12: 702 pg/mL (ref 200–1100)

## 2018-05-19 LAB — FOLATE: Folate: 20.4 ng/mL

## 2018-05-19 LAB — PATHOLOGIST SMEAR REVIEW

## 2018-05-22 ENCOUNTER — Ambulatory Visit (INDEPENDENT_AMBULATORY_CARE_PROVIDER_SITE_OTHER): Payer: Medicare Other | Admitting: *Deleted

## 2018-05-22 DIAGNOSIS — I428 Other cardiomyopathies: Secondary | ICD-10-CM

## 2018-05-22 NOTE — Progress Notes (Signed)
Remote ICD transmission.   

## 2018-05-25 ENCOUNTER — Ambulatory Visit (INDEPENDENT_AMBULATORY_CARE_PROVIDER_SITE_OTHER): Payer: Medicare Other | Admitting: Internal Medicine

## 2018-05-25 VITALS — BP 110/60 | HR 82 | Temp 98.4°F | Ht 59.0 in | Wt 194.0 lb

## 2018-05-25 DIAGNOSIS — S81811D Laceration without foreign body, right lower leg, subsequent encounter: Secondary | ICD-10-CM | POA: Diagnosis not present

## 2018-05-25 DIAGNOSIS — D539 Nutritional anemia, unspecified: Secondary | ICD-10-CM

## 2018-05-25 DIAGNOSIS — Z1211 Encounter for screening for malignant neoplasm of colon: Secondary | ICD-10-CM

## 2018-05-25 LAB — CBC WITH DIFFERENTIAL/PLATELET
BASOS ABS: 49 {cells}/uL (ref 0–200)
Basophils Relative: 1.3 %
Eosinophils Absolute: 68 cells/uL (ref 15–500)
Eosinophils Relative: 1.8 %
HEMATOCRIT: 28.8 % — AB (ref 35.0–45.0)
Hemoglobin: 9.6 g/dL — ABNORMAL LOW (ref 11.7–15.5)
Lymphs Abs: 1919 cells/uL (ref 850–3900)
MCH: 33.8 pg — ABNORMAL HIGH (ref 27.0–33.0)
MCHC: 33.3 g/dL (ref 32.0–36.0)
MCV: 101.4 fL — AB (ref 80.0–100.0)
MPV: 10.4 fL (ref 7.5–12.5)
Monocytes Relative: 7 %
NEUTROS PCT: 39.4 %
Neutro Abs: 1497 cells/uL — ABNORMAL LOW (ref 1500–7800)
Platelets: 338 10*3/uL (ref 140–400)
RBC: 2.84 10*6/uL — ABNORMAL LOW (ref 3.80–5.10)
RDW: 17.9 % — AB (ref 11.0–15.0)
TOTAL LYMPHOCYTE: 50.5 %
WBC mixed population: 266 cells/uL (ref 200–950)
WBC: 3.8 10*3/uL (ref 3.8–10.8)

## 2018-05-25 LAB — HEMOCCULT GUIAC POC 1CARD (OFFICE)
Card #2 Date: 9092019
Card #2 Fecal Occult Blod, POC: NEGATIVE
Card #3 Fecal Occult Blood, POC: NEGATIVE
Fecal Occult Blood, POC: NEGATIVE

## 2018-05-25 NOTE — Patient Instructions (Signed)
GI and hematology evaluations.  Can finish antibiotics for injury to right lower extremity.

## 2018-05-25 NOTE — Addendum Note (Signed)
Addended by: Mady Haagensen on: 05/25/2018 05:33 PM   Modules accepted: Orders

## 2018-05-25 NOTE — Addendum Note (Signed)
Addended by: Mady Haagensen on: 05/25/2018 05:34 PM   Modules accepted: Orders

## 2018-05-25 NOTE — Progress Notes (Signed)
   Subjective:    Patient ID: [name], female    DOB: Apr 15, 1946, 72 y.o.   MRN: [id number]  HPI Returns with 3 hemoccult cards.They are to be developed by CMA today. Having some epigastric discomfort but no BRBPR or melena. Last colonoscopy was in 2016 and polyp removed was benign with 5 year followup recommended. Has upcoming GI appt to evaluate anemia. CBC repeated today. Has macrocytic anemia with no B12 or folate deficiency. If no GI source, she will be referred to Hematology to r/o a myelodysplastic process. I see no drugs that would be contributing to anemia and path review of smear unremarkable.  Injury to right leg from large dog bumping into her is slowly healing with no signs of infection. Finishing course of antibiotics.    Review of Systems see above     Objective:   Physical Exam Right lower leg about 6 inches below knee has a 4 cm linear abraded area that is healing with surrounding erythema.  Leg is not warm to touch.  Homans sign is negative.       Assessment & Plan:  Healing laceration/abrasion right lower leg from dog running into her leg.  She denies it was a dog bite.  Macrocytic anemia-etiology unclear.  To have GI evaluation and Hematology evaluation.  She does not appear to have B12 or folate deficiency.  3 Hemoccult cards will be developed today.  Having some epigastric discomfort.  She is only taking a folate supplement.  Pathology review of smear shows no abnormal cells.  CBC rechecked today.

## 2018-05-29 ENCOUNTER — Ambulatory Visit
Admission: RE | Admit: 2018-05-29 | Discharge: 2018-05-29 | Disposition: A | Discharge status: Home / Self Care | Payer: Medicare Other | Source: Ambulatory Visit | Attending: Internal Medicine | Admitting: Internal Medicine

## 2018-05-29 DIAGNOSIS — Z1231 Encounter for screening mammogram for malignant neoplasm of breast: Secondary | ICD-10-CM | POA: Diagnosis not present

## 2018-05-30 DIAGNOSIS — H2513 Age-related nuclear cataract, bilateral: Secondary | ICD-10-CM | POA: Diagnosis not present

## 2018-05-30 DIAGNOSIS — H25013 Cortical age-related cataract, bilateral: Secondary | ICD-10-CM | POA: Diagnosis not present

## 2018-05-30 DIAGNOSIS — H40013 Open angle with borderline findings, low risk, bilateral: Secondary | ICD-10-CM | POA: Diagnosis not present

## 2018-05-30 DIAGNOSIS — E119 Type 2 diabetes mellitus without complications: Secondary | ICD-10-CM | POA: Diagnosis not present

## 2018-05-30 LAB — HM DIABETES EYE EXAM

## 2018-05-31 ENCOUNTER — Encounter: Payer: Self-pay | Admitting: Internal Medicine

## 2018-06-12 ENCOUNTER — Other Ambulatory Visit: Payer: Self-pay | Admitting: Internal Medicine

## 2018-06-13 LAB — CUP PACEART REMOTE DEVICE CHECK
Brady Statistic RV Percent Paced: 0 %
Date Time Interrogation Session: 20190909055100
HIGH POWER IMPEDANCE MEASURED VALUE: 51 Ohm
Implantable Lead Implant Date: 20110221
Implantable Lead Location: 753859
Implantable Lead Location: 753860
Implantable Pulse Generator Implant Date: 20110221
Lead Channel Pacing Threshold Amplitude: 0.5 V
Lead Channel Pacing Threshold Amplitude: 0.5 V
Lead Channel Pacing Threshold Pulse Width: 0.4 ms
Lead Channel Pacing Threshold Pulse Width: 0.4 ms
Lead Channel Setting Pacing Amplitude: 2 V
Lead Channel Setting Sensing Sensitivity: 0.6 mV
MDC IDC LEAD IMPLANT DT: 20110221
MDC IDC LEAD SERIAL: 301957
MDC IDC MSMT BATTERY REMAINING LONGEVITY: 48 mo
MDC IDC MSMT BATTERY REMAINING PERCENTAGE: 55 %
MDC IDC MSMT LEADCHNL RA IMPEDANCE VALUE: 450 Ohm
MDC IDC MSMT LEADCHNL RV IMPEDANCE VALUE: 554 Ohm
MDC IDC SET LEADCHNL RV PACING AMPLITUDE: 2.4 V
MDC IDC SET LEADCHNL RV PACING PULSEWIDTH: 0.4 ms
MDC IDC STAT BRADY RA PERCENT PACED: 0 %
Pulse Gen Serial Number: 160860

## 2018-06-29 ENCOUNTER — Encounter: Payer: Self-pay | Admitting: *Deleted

## 2018-07-14 ENCOUNTER — Ambulatory Visit (INDEPENDENT_AMBULATORY_CARE_PROVIDER_SITE_OTHER): Payer: Medicare Other | Admitting: Internal Medicine

## 2018-07-14 ENCOUNTER — Encounter (INDEPENDENT_AMBULATORY_CARE_PROVIDER_SITE_OTHER): Payer: Self-pay

## 2018-07-14 ENCOUNTER — Encounter: Payer: Self-pay | Admitting: Internal Medicine

## 2018-07-14 VITALS — BP 124/66 | HR 72 | Ht 59.0 in | Wt 197.0 lb

## 2018-07-14 DIAGNOSIS — K219 Gastro-esophageal reflux disease without esophagitis: Secondary | ICD-10-CM

## 2018-07-14 DIAGNOSIS — Z860100 Personal history of colon polyps, unspecified: Secondary | ICD-10-CM

## 2018-07-14 DIAGNOSIS — R1310 Dysphagia, unspecified: Secondary | ICD-10-CM | POA: Diagnosis not present

## 2018-07-14 DIAGNOSIS — D539 Nutritional anemia, unspecified: Secondary | ICD-10-CM

## 2018-07-14 MED ORDER — OMEPRAZOLE 20 MG PO CPDR: 20.0000 mg | DELAYED_RELEASE_CAPSULE | Freq: Two times a day (BID) | ORAL | 1 refills | Status: DC

## 2018-07-14 NOTE — Progress Notes (Signed)
Patient ID: [name], female   DOB: 1946/05/10, 72 y.o.   MRN: [id number] HPI: [name] is a 72 year old female with a past medical history of sessile serrated colon polyp, GERD, congestive heart failure status post ICD placement, dyslipidemia who is seen in consult at the request of Dr. Renold Genta to evaluate a macrocytic anemia.  She is here alone today.  She is known to me from 2 previous colonoscopies the first performed in 2015 for positive Cologuard.  A sessile serrated polyp was removed from the ascending colon.  She had a follow-up colonoscopy one year later on 02/25/2015 which revealed a 5 mm polyp in the descending colon.  The polypectomy site in the ascending colon revealed no residual polyp.  The descending polyp was again a sessile serrated polyp.  She reports having issues with anemia and Dr. Renold Genta referred her to see [place].  She has not seen any visible blood in her stool or melena.  No change in her bowel habit though stools do vary from slightly constipated to slightly loose to normal.  No abdominal pain.  She does have issues with indigestion about 2 times per week she will have heartburn despite Prilosec 20 mg once daily.  She also feels some mild dysphagia with food stopping in her lower chest.  This bothers her most with pills and meats and breads.  This is not been a progressive issue.  She reports symptoms are worse at bedtime.  In her past she has dealt with esophageal spasm.  No prior EGD.  Past Medical History:  Diagnosis Date  . Automatic implantable cardioverter-defibrillator in situ    Dr. Beckie Salts follows-next visit- [phone]  . Cataract   . Diverticulosis   . Dyslipidemia   . Ejection fraction < 50%    EF 25-30%, September, 2010  . GERD (gastroesophageal reflux disease)   . Hypothyroidism   . ICD (implantable cardiac defibrillator) in place    10/2009; Dr. Lovena Le  . LBBB (left bundle branch block)    old  . Macrocytic anemia   . Mitral regurgitation    Mild,  echo, September, 2010  . Nonischemic cardiomyopathy ([place])    Normal coronary arteries, catheterization, September, 2009  . Overweight(278.02)   . Serrated polyp of colon   . Systolic CHF, chronic (HCC)    no recent problems    Past Surgical History:  Procedure Laterality Date  . CARDIAC CATHETERIZATION     '09 last  . [place]    . COLONOSCOPY N/A 11/29/2013   Procedure: COLONOSCOPY;  Surgeon: Jerene Bears, MD;  Location: [facility];  Service: Gastroenterology;  Laterality: N/A;  . COLONOSCOPY N/A 02/25/2015   Procedure: COLONOSCOPY;  Surgeon: Jerene Bears, MD;  Location: [facility];  Service: Gastroenterology;  Laterality: N/A;  . EYE SURGERY Left    x 3-"droopy eyelid"  . heart catherization    . TONSILLECTOMY  1952    Outpatient Medications Prior to Visit  Medication Sig Dispense Refill  . albuterol (PROVENTIL HFA;VENTOLIN HFA) 108 (90 Base) MCG/ACT inhaler Inhale 2 puffs into the lungs every 6 (six) hours as needed for wheezing or shortness of breath. 1 Inhaler prn  . carvedilol (COREG) 25 MG tablet TAKE 1 TABLET BY MOUTH TWICE A DAY 180 tablet 2  . Cholecalciferol (VITAMIN D PO) Take 2 tablets by mouth every morning.     . folic acid (FOLVITE) 1 MG tablet Take 1 tablet (1 mg total) by mouth daily. 90 tablet 3  .  furosemide (LASIX) 40 MG tablet TAKE 1 TABLET BY MOUTH EVERY DAY 90 tablet 2  . glucose blood (ACCU-CHEK AVIVA) test strip Check daily.  For Diabetes 250.00 100 each 12  . ipratropium-albuterol (DUONEB) 0.5-2.5 (3) MG/3ML SOLN Take 3 mLs by nebulization every 4 (four) hours as needed (shortness of breath or wheezing).    . Lancets MISC Check glucose daily.  Diabetes 250.00 100 each 11  . levothyroxine (SYNTHROID, LEVOTHROID) 150 MCG tablet TAKE 1 TABLET BY MOUTH EVERY DAY 90 tablet 0  . losartan (COZAAR) 25 MG tablet TAKE 1 TABLET BY MOUTH EVERY DAY 90 tablet 2  . Multiple Vitamin (MULTIVITAMIN WITH MINERALS) TABS tablet Take 1 tablet by mouth  every morning. chewable    . omeprazole (PRILOSEC) 20 MG capsule TAKE ONE CAPSULE BY MOUTH EVERY DAY 90 capsule 3  . mupirocin ointment (BACTROBAN) 2 % Apply to abrasion/laceration right leg twice a day 22 g 0   No facility-administered medications prior to visit.     No Known Allergies  Family History  Problem Relation Age of Onset  . Heart attack Mother   . Heart disease Mother   . Hypertension Mother   . Lung cancer Father   . Pancreatic cancer Father   . Cancer Father   . Stroke Paternal Grandfather   . Colon cancer Neg Hx   . Diabetes Neg Hx   . Kidney disease Neg Hx   . Liver disease Neg Hx     Social History   Tobacco Use  . Smoking status: Never Smoker  . Smokeless tobacco: Never Used  Substance Use Topics  . Alcohol use: Yes    Comment: 2-4 beers per week  . Drug use: No    ROS: As per history of present illness, otherwise negative  BP 124/66   Pulse 72   Ht 4\' 11"  (1.499 m)   Wt 197 lb (89.4 kg)   BMI 39.79 kg/m  Gen: awake, alert, NAD HEENT: anicteric, op clear CV: RRR, no mrg Pulm: CTA b/l Abd: soft, NT/ND, +BS throughout Ext: no c/c/e Neuro: nonfocal   RELEVANT LABS AND IMAGING: CBC    Component Value Date/Time   WBC 3.8 05/25/2018 1019   RBC 2.84 (L) 05/25/2018 1019   HGB 9.6 (L) 05/25/2018 1019   HCT 28.8 (L) 05/25/2018 1019   PLT 338 05/25/2018 1019   MCV 101.4 (H) 05/25/2018 1019   MCV 98.7 (A) 08/28/2014 1225   MCH 33.8 (H) 05/25/2018 1019   MCHC 33.3 05/25/2018 1019   RDW 17.9 (H) 05/25/2018 1019   LYMPHSABS 1,919 05/25/2018 1019   MONOABS 400 05/03/2017 1021   EOSABS 68 05/25/2018 1019   BASOSABS 49 05/25/2018 1019    CMP     Component Value Date/Time   NA 143 05/12/2018 0913   NA 146 (H) 02/28/2017 1431   K 3.8 05/12/2018 0913   CL 100 05/12/2018 0913   CO2 35 (H) 05/12/2018 0913   GLUCOSE 102 (H) 05/12/2018 0913   BUN 16 05/12/2018 0913   BUN 25 02/28/2017 1431   CREATININE 0.82 05/12/2018 0913   CALCIUM 9.1  05/12/2018 0913   PROT 6.2 05/12/2018 0913   ALBUMIN 4.1 05/03/2017 1021   AST 10 05/12/2018 0913   ALT 8 05/12/2018 0913   ALKPHOS 79 05/03/2017 1021   BILITOT 1.1 05/12/2018 0913   GFRNONAA 72 05/12/2018 0913   GFRAA 83 05/12/2018 0913   Lab Results  Component Value Date   VITAMINB12 702 05/18/2018  Lab Results  Component Value Date   FOLATE 20.4 05/18/2018    ASSESSMENT/PLAN:  73 year old female with a past medical history of sessile serrated colon polyp, GERD, congestive heart failure status post ICD placement, dyslipidemia who is seen in consult at the request of Dr. Renold Genta to evaluate a macrocytic anemia.  1.  GERD with dysphagia --upper endoscopy is recommended.  We discussed the risk, benefits and alternatives and she is agreeable and wishes to proceed.  She has an echocardiogram scheduled for 07/17/2018 and depending on her ejection fraction we will determine whether she is suitable for the [facility] endoscopy Center versus the outpatient hospital setting. In the interim increase omeprazole to 20 mg twice daily AC  2.  Macrocytic anemia --heme negative stool when tested recently by Dr. Renold Genta.  No evidence for GI blood loss and I explained to her how blood loss anemia would be more microcytic and macrocytic.  Her B12 and folate were normal.  I am referring her to see hematology for further evaluation of macrocytic anemia.  3.  History of colon polyp/sessile serrated polyp --surveillance colonoscopy would be due at the 5-year mark, which for her would be 2021.  Not inclined to repeat the test at this time given the fact that she has a macrocytic, not microcytic anemia.  Reassuring that stool is heme-negative.     TL:[id number], Cresenciano Lick, Md [address] [place], [place] [address]

## 2018-07-14 NOTE — Patient Instructions (Addendum)
We have sent the following medications to your pharmacy for you to pick up at your convenience: Omeprazole 20 mg twice daily before meals.  We will schedule you for an appointment with Hematology and call you once the appointment information is made available to [place].  You have been scheduled for an endoscopy. Please follow written instructions given to you at your visit today. If you use inhalers (even only as needed), please bring them with you on the day of your procedure. Your physician has requested that you go to www.startemmi.com and enter the access code given to you at your visit today. This web site gives a general overview about your procedure. However, you should still follow specific instructions given to you by our office regarding your preparation for the procedure.  If you are age 72 or older, your body mass index should be between 23-30. Your Body mass index is 39.79 kg/m. If this is out of the aforementioned range listed, please consider follow up with your Primary Care Provider.  If you are age 15 or younger, your body mass index should be between 19-25. Your Body mass index is 39.79 kg/m. If this is out of the aformentioned range listed, please consider follow up with your Primary Care Provider.

## 2018-07-14 NOTE — H&P (View-Only) (Signed)
Patient ID: [name], female   DOB: Nov 13, 1945, 72 y.o.   MRN: [id number] HPI: [name] is a 72 year old female with a past medical history of sessile serrated colon polyp, GERD, congestive heart failure status post ICD placement, dyslipidemia who is seen in consult at the request of Dr. Renold Genta to evaluate a macrocytic anemia.  She is here alone today.  She is known to me from 2 previous colonoscopies the first performed in 2015 for positive Cologuard.  A sessile serrated polyp was removed from the ascending colon.  She had a follow-up colonoscopy one year later on 02/25/2015 which revealed a 5 mm polyp in the descending colon.  The polypectomy site in the ascending colon revealed no residual polyp.  The descending polyp was again a sessile serrated polyp.  She reports having issues with anemia and Dr. Renold Genta referred her to see [place].  She has not seen any visible blood in her stool or melena.  No change in her bowel habit though stools do vary from slightly constipated to slightly loose to normal.  No abdominal pain.  She does have issues with indigestion about 2 times per week she will have heartburn despite Prilosec 20 mg once daily.  She also feels some mild dysphagia with food stopping in her lower chest.  This bothers her most with pills and meats and breads.  This is not been a progressive issue.  She reports symptoms are worse at bedtime.  In her past she has dealt with esophageal spasm.  No prior EGD.  Past Medical History:  Diagnosis Date  . Automatic implantable cardioverter-defibrillator in situ    Dr. Beckie Salts follows-next visit- [phone]  . Cataract   . Diverticulosis   . Dyslipidemia   . Ejection fraction < 50%    EF 25-30%, September, 2010  . GERD (gastroesophageal reflux disease)   . Hypothyroidism   . ICD (implantable cardiac defibrillator) in place    10/2009; Dr. Lovena Le  . LBBB (left bundle branch block)    old  . Macrocytic anemia   . Mitral regurgitation    Mild,  echo, September, 2010  . Nonischemic cardiomyopathy ([place])    Normal coronary arteries, catheterization, September, 2009  . Overweight(278.02)   . Serrated polyp of colon   . Systolic CHF, chronic (HCC)    no recent problems    Past Surgical History:  Procedure Laterality Date  . CARDIAC CATHETERIZATION     '09 last  . [place]    . COLONOSCOPY N/A 11/29/2013   Procedure: COLONOSCOPY;  Surgeon: Jerene Bears, MD;  Location: [facility];  Service: Gastroenterology;  Laterality: N/A;  . COLONOSCOPY N/A 02/25/2015   Procedure: COLONOSCOPY;  Surgeon: Jerene Bears, MD;  Location: [facility];  Service: Gastroenterology;  Laterality: N/A;  . EYE SURGERY Left    x 3-"droopy eyelid"  . heart catherization    . TONSILLECTOMY  1952    Outpatient Medications Prior to Visit  Medication Sig Dispense Refill  . albuterol (PROVENTIL HFA;VENTOLIN HFA) 108 (90 Base) MCG/ACT inhaler Inhale 2 puffs into the lungs every 6 (six) hours as needed for wheezing or shortness of breath. 1 Inhaler prn  . carvedilol (COREG) 25 MG tablet TAKE 1 TABLET BY MOUTH TWICE A DAY 180 tablet 2  . Cholecalciferol (VITAMIN D PO) Take 2 tablets by mouth every morning.     . folic acid (FOLVITE) 1 MG tablet Take 1 tablet (1 mg total) by mouth daily. 90 tablet 3  .  furosemide (LASIX) 40 MG tablet TAKE 1 TABLET BY MOUTH EVERY DAY 90 tablet 2  . glucose blood (ACCU-CHEK AVIVA) test strip Check daily.  For Diabetes 250.00 100 each 12  . ipratropium-albuterol (DUONEB) 0.5-2.5 (3) MG/3ML SOLN Take 3 mLs by nebulization every 4 (four) hours as needed (shortness of breath or wheezing).    . Lancets MISC Check glucose daily.  Diabetes 250.00 100 each 11  . levothyroxine (SYNTHROID, LEVOTHROID) 150 MCG tablet TAKE 1 TABLET BY MOUTH EVERY DAY 90 tablet 0  . losartan (COZAAR) 25 MG tablet TAKE 1 TABLET BY MOUTH EVERY DAY 90 tablet 2  . Multiple Vitamin (MULTIVITAMIN WITH MINERALS) TABS tablet Take 1 tablet by mouth  every morning. chewable    . omeprazole (PRILOSEC) 20 MG capsule TAKE ONE CAPSULE BY MOUTH EVERY DAY 90 capsule 3  . mupirocin ointment (BACTROBAN) 2 % Apply to abrasion/laceration right leg twice a day 22 g 0   No facility-administered medications prior to visit.     No Known Allergies  Family History  Problem Relation Age of Onset  . Heart attack Mother   . Heart disease Mother   . Hypertension Mother   . Lung cancer Father   . Pancreatic cancer Father   . Cancer Father   . Stroke Paternal Grandfather   . Colon cancer Neg Hx   . Diabetes Neg Hx   . Kidney disease Neg Hx   . Liver disease Neg Hx     Social History   Tobacco Use  . Smoking status: Never Smoker  . Smokeless tobacco: Never Used  Substance Use Topics  . Alcohol use: Yes    Comment: 2-4 beers per week  . Drug use: No    ROS: As per history of present illness, otherwise negative  BP 124/66   Pulse 72   Ht 4\' 11"  (1.499 m)   Wt 197 lb (89.4 kg)   BMI 39.79 kg/m  Gen: awake, alert, NAD HEENT: anicteric, op clear CV: RRR, no mrg Pulm: CTA b/l Abd: soft, NT/ND, +BS throughout Ext: no c/c/e Neuro: nonfocal   RELEVANT LABS AND IMAGING: CBC    Component Value Date/Time   WBC 3.8 05/25/2018 1019   RBC 2.84 (L) 05/25/2018 1019   HGB 9.6 (L) 05/25/2018 1019   HCT 28.8 (L) 05/25/2018 1019   PLT 338 05/25/2018 1019   MCV 101.4 (H) 05/25/2018 1019   MCV 98.7 (A) 08/28/2014 1225   MCH 33.8 (H) 05/25/2018 1019   MCHC 33.3 05/25/2018 1019   RDW 17.9 (H) 05/25/2018 1019   LYMPHSABS 1,919 05/25/2018 1019   MONOABS 400 05/03/2017 1021   EOSABS 68 05/25/2018 1019   BASOSABS 49 05/25/2018 1019    CMP     Component Value Date/Time   NA 143 05/12/2018 0913   NA 146 (H) 02/28/2017 1431   K 3.8 05/12/2018 0913   CL 100 05/12/2018 0913   CO2 35 (H) 05/12/2018 0913   GLUCOSE 102 (H) 05/12/2018 0913   BUN 16 05/12/2018 0913   BUN 25 02/28/2017 1431   CREATININE 0.82 05/12/2018 0913   CALCIUM 9.1  05/12/2018 0913   PROT 6.2 05/12/2018 0913   ALBUMIN 4.1 05/03/2017 1021   AST 10 05/12/2018 0913   ALT 8 05/12/2018 0913   ALKPHOS 79 05/03/2017 1021   BILITOT 1.1 05/12/2018 0913   GFRNONAA 72 05/12/2018 0913   GFRAA 83 05/12/2018 0913   Lab Results  Component Value Date   VITAMINB12 702 05/18/2018  Lab Results  Component Value Date   FOLATE 20.4 05/18/2018    ASSESSMENT/PLAN:  72 year old female with a past medical history of sessile serrated colon polyp, GERD, congestive heart failure status post ICD placement, dyslipidemia who is seen in consult at the request of Dr. Renold Genta to evaluate a macrocytic anemia.  1.  GERD with dysphagia --upper endoscopy is recommended.  We discussed the risk, benefits and alternatives and she is agreeable and wishes to proceed.  She has an echocardiogram scheduled for 07/17/2018 and depending on her ejection fraction we will determine whether she is suitable for the [facility] endoscopy Center versus the outpatient hospital setting. In the interim increase omeprazole to 20 mg twice daily AC  2.  Macrocytic anemia --heme negative stool when tested recently by Dr. Renold Genta.  No evidence for GI blood loss and I explained to her how blood loss anemia would be more microcytic and macrocytic.  Her B12 and folate were normal.  I am referring her to see hematology for further evaluation of macrocytic anemia.  3.  History of colon polyp/sessile serrated polyp --surveillance colonoscopy would be due at the 5-year mark, which for her would be 2021.  Not inclined to repeat the test at this time given the fact that she has a macrocytic, not microcytic anemia.  Reassuring that stool is heme-negative.     KG:SUPJSR, Cresenciano Lick, Md [address] [place], [place] [address]

## 2018-07-17 ENCOUNTER — Ambulatory Visit (HOSPITAL_COMMUNITY): Payer: Medicare Other | Attending: Cardiovascular Disease

## 2018-07-17 ENCOUNTER — Other Ambulatory Visit: Payer: Self-pay

## 2018-07-17 ENCOUNTER — Telehealth: Payer: Self-pay | Admitting: Hematology and Oncology

## 2018-07-17 ENCOUNTER — Encounter: Payer: Self-pay | Admitting: Hematology and Oncology

## 2018-07-17 DIAGNOSIS — I5042 Chronic combined systolic (congestive) and diastolic (congestive) heart failure: Secondary | ICD-10-CM | POA: Insufficient documentation

## 2018-07-17 MED ORDER — PERFLUTREN LIPID MICROSPHERE
1.0000 mL | INTRAVENOUS | Status: AC | PRN
  Administered 2018-07-17: 2 mL via INTRAVENOUS

## 2018-07-17 NOTE — Telephone Encounter (Signed)
New hematology referral received from Dr. Hilarie Fredrickson for microcytic anemia. Pt has been cld and scheduled to see Dr. Audelia Hives on 11/21 at 1pm. Letter mailed to the pt.

## 2018-07-24 ENCOUNTER — Telehealth: Payer: Self-pay | Admitting: *Deleted

## 2018-07-24 ENCOUNTER — Encounter: Payer: Self-pay | Admitting: Cardiovascular Disease

## 2018-07-24 ENCOUNTER — Ambulatory Visit (INDEPENDENT_AMBULATORY_CARE_PROVIDER_SITE_OTHER): Payer: Medicare Other | Admitting: Cardiovascular Disease

## 2018-07-24 ENCOUNTER — Other Ambulatory Visit: Payer: Self-pay | Admitting: Internal Medicine

## 2018-07-24 VITALS — BP 86/48 | HR 82 | Ht 59.0 in | Wt 200.0 lb

## 2018-07-24 DIAGNOSIS — I447 Left bundle-branch block, unspecified: Secondary | ICD-10-CM

## 2018-07-24 DIAGNOSIS — I5022 Chronic systolic (congestive) heart failure: Secondary | ICD-10-CM | POA: Diagnosis not present

## 2018-07-24 DIAGNOSIS — Z95810 Presence of automatic (implantable) cardiac defibrillator: Secondary | ICD-10-CM

## 2018-07-24 DIAGNOSIS — K219 Gastro-esophageal reflux disease without esophagitis: Secondary | ICD-10-CM

## 2018-07-24 DIAGNOSIS — R1310 Dysphagia, unspecified: Secondary | ICD-10-CM

## 2018-07-24 NOTE — Telephone Encounter (Signed)
Patient has been advised of time/date/location change of endoscopy due to low EF. She verbalizes clear understanding of these changes.

## 2018-07-24 NOTE — Progress Notes (Signed)
Cardiology Office Note   Date:  07/24/2018   ID:  [name], [name] [birth year], MRN [id number]  PCP:  Elby Showers, MD  Cardiologist: previous Ron Parker patient , now  [name], MD   Chief Complaint  Patient presents with  . Congestive Heart Failure   Problem List 1. Chronic systolic congestive heart failure 2. ICD 3. Hypothyroidism     [name] is a 72 y.o. female who presents today to follow up cardiomyopathy. She is doing very well. She is not having any chest pain or shortness of breath. She's had no syncope or presyncope. Her ICD is followed carefully by Dr. Lovena Le. I saw her last October, 2015. She had an echo shortly after that. Her EF was in the 30-35% range. This is slightly better than it had been. We have her on the maximum doses of medicines that she can tolerate.  December 09, 2015:  Doing well.    Former patient of Dr. Ron Parker.  No CP ,  Chronic DOE with activity .  No dyspnea with sitting or lying down .   Jan. 8, 2018:  Doing well.  Mild dyspnea when she exerts herself , no CP   Aug. 22, 2018: She has a history of  chronic systolic congestive heart failure. Her last echocardiogram was in 2015 which shows an ejection fraction of 30-35%. She has a defibrillator. Has some dyspnea when walking  Is on Coreg 25 BID, Losartan 25 mg a day , Lasix  She was on Spironolactone but this was stopped when she developed some renal insufficiency while on valsartan plus Spironolactone  Has started doing water aeorbics  Jan 11, 2018:  Previous Ron Parker patient  Hx of CHF ,   Has an ICD  BP is mildly elevated here.   Has been diagnosed with pneumonia by Dr. Renold Genta  - has been started on Abx.  Has prednisone, doxycycline, nebs   Nov. 11, 2019: [name] is a 72 year old female with a history of chronic systolic congestive heart failure.  Recent echocardiogram shows moderate to severe left ventricular dysfunction with an ejection fraction of 25 to 30%.  She has grade 2  diastolic dysfunction.  No syncope,  Has some DOE, No CP  Has some leg fatigue    Past Medical History:  Diagnosis Date  . Automatic implantable cardioverter-defibrillator in situ    Dr. Beckie Salts follows-next visit- [phone]  . Cataract   . Diverticulosis   . Dyslipidemia   . Ejection fraction < 50%    EF 25-30%, September, 2010  . GERD (gastroesophageal reflux disease)   . Hypothyroidism   . ICD (implantable cardiac defibrillator) in place    10/2009; Dr. Lovena Le  . LBBB (left bundle branch block)    old  . Macrocytic anemia   . Mitral regurgitation    Mild, echo, September, 2010  . Nonischemic cardiomyopathy ([place])    Normal coronary arteries, catheterization, September, 2009  . Overweight(278.02)   . Serrated polyp of colon   . Systolic CHF, chronic (HCC)    no recent problems    Past Surgical History:  Procedure Laterality Date  . CARDIAC CATHETERIZATION     '09 last  . [place]    . COLONOSCOPY N/A 11/29/2013   Procedure: COLONOSCOPY;  Surgeon: Jerene Bears, MD;  Location: [facility];  Service: Gastroenterology;  Laterality: N/A;  . COLONOSCOPY N/A 02/25/2015   Procedure: COLONOSCOPY;  Surgeon: Jerene Bears, MD;  Location[facility];  Service: Gastroenterology;  Laterality: N/A;  . EYE SURGERY Left    x 3-"droopy eyelid"  . heart catherization    . TONSILLECTOMY  1952    Patient Active Problem List   Diagnosis Date Noted  . Chronic systolic CHF (congestive heart failure) ([place])     Priority: High  . Impaired glucose tolerance 06/21/2017  . Cough variant asthma  vs UACS from ACEi  12/31/2016  . Essential hypertension 12/31/2016  . Morbid obesity due to excess calories ([place]) 12/31/2016  . History of colonic polyps   . Benign neoplasm of descending colon   . Diverticulosis of colon without hemorrhage 01/28/2014  . Serrated adenoma of colon 01/28/2014  . Nonspecific abnormal finding in stool contents 11/29/2013  . Colon cancer  screening 11/29/2013  . History of depression 08/11/2012  . Systolic CHF, chronic ([place])   . LBBB (left bundle branch block)   . Automatic implantable cardioverter-defibrillator in situ   . Hypothyroidism   . GERD (gastroesophageal reflux disease)   . Dyslipidemia   . Mitral regurgitation   . Overweight   . Ejection fraction < 50%       Current Outpatient Medications  Medication Sig Dispense Refill  . albuterol (PROVENTIL HFA;VENTOLIN HFA) 108 (90 Base) MCG/ACT inhaler Inhale 2 puffs into the lungs every 6 (six) hours as needed for wheezing or shortness of breath. 1 Inhaler prn  . carvedilol (COREG) 25 MG tablet TAKE 1 TABLET BY MOUTH TWICE A DAY 180 tablet 2  . Cholecalciferol (VITAMIN D PO) Take 2 tablets by mouth every morning.     . folic acid (FOLVITE) 1 MG tablet Take 1 tablet (1 mg total) by mouth daily. 90 tablet 3  . furosemide (LASIX) 40 MG tablet TAKE 1 TABLET BY MOUTH EVERY DAY 90 tablet 2  . glucose blood (ACCU-CHEK AVIVA) test strip Check daily.  For Diabetes 250.00 100 each 12  . ipratropium-albuterol (DUONEB) 0.5-2.5 (3) MG/3ML SOLN Take 3 mLs by nebulization every 4 (four) hours as needed (shortness of breath or wheezing).    . Lancets MISC Check glucose daily.  Diabetes 250.00 100 each 11  . levothyroxine (SYNTHROID, LEVOTHROID) 150 MCG tablet TAKE 1 TABLET BY MOUTH EVERY DAY 90 tablet 0  . losartan (COZAAR) 25 MG tablet TAKE 1 TABLET BY MOUTH EVERY DAY 90 tablet 2  . Multiple Vitamin (MULTIVITAMIN WITH MINERALS) TABS tablet Take 1 tablet by mouth every morning. chewable    . omeprazole (PRILOSEC) 20 MG capsule Take 1 capsule (20 mg total) by mouth 2 (two) times daily before a meal. 180 capsule 1   No current facility-administered medications for this visit.     Allergies:   Patient has no known allergies.    Social History:  The patient  reports that she has never smoked. She has never used smokeless tobacco. She reports that she drinks alcohol. She reports that  she does not use drugs.   Family History:  The patient's family history includes Cancer in her father; Heart attack in her mother; Heart disease in her mother; Hypertension in her mother; Lung cancer in her father; Pancreatic cancer in her father; Stroke in her paternal grandfather.   ROS:    As in HPI, otherwise ROS is negative  Physical Exam: Blood pressure (!) 86/48, pulse 82, height 4\' 11"  (1.499 m), weight 200 lb (90.7 kg), SpO2 95 %.  GEN:  Well nourished, well developed in no acute distress HEENT: Normal NECK: No JVD; No carotid bruits LYMPHATICS: No  lymphadenopathy CARDIAC: RRR   RESPIRATORY:  Clear to auscultation without rales, wheezing or rhonchi  ABDOMEN: Soft, non-tender, non-distended MUSCULOSKELETAL:  No edema; No deformity  SKIN: Warm and dry NEUROLOGIC:  Alert and oriented x 3       EKG:      Recent Labs: 05/12/2018: ALT 8; BUN 16; Creat 0.82; Potassium 3.8; Sodium 143; TSH 0.40 05/25/2018: Hemoglobin 9.6; Platelets 338    Lipid Panel    Component Value Date/Time   CHOL 165 05/12/2018 0913   TRIG 73 05/12/2018 0913   HDL 48 (L) 05/12/2018 0913   CHOLHDL 3.4 05/12/2018 0913   VLDL 15 05/03/2017 1021   LDLCALC 101 (H) 05/12/2018 0913      Wt Readings from Last 3 Encounters:  07/24/18 200 lb (90.7 kg)  07/14/18 197 lb (89.4 kg)  05/25/18 194 lb (88 kg)      Current medicines are reviewed  The patient understands her medications.     ASSESSMENT AND PLAN:  1. Chronic systolic congestive heart failure  ; her blood pressures too low to titrate medications.  I do not think that she would tolerate Entresto. His left bundle branch block and she has evidence of dyssynchrony on echo.  I think that her ICD needs to be upgraded to a biventricular pacing/ICD.  Will ask Dr. Lovena Le to comment on this.  2. ICD -ejection fraction has fallen over the past several years.  I think she would fit from resynchronization therapy.  Have her follow-up with Dr.  Lovena Le.  3. Hypothyroidism - followed by her primary   4.  Upper respiratory tract infection: Patient is thought to have pneumonia.  She has received antibiotics, prednisone, nebulizers from Dr. Renold Genta.  Follow with Dr. Renold Genta for this    [name], MD  07/24/2018 1:36 PM    [address],  [place], [place]  [address] Pager [phone] Phone: [phone]; Fax: [phone]

## 2018-07-24 NOTE — Telephone Encounter (Signed)
Patient has been scheduled to have endoscopy with propofol on 08/07/18 at 10:00 am at [facility]. She will need to arrive at 8:30 am for registration. She will follow same prep instructions as she had previously for [place].

## 2018-07-24 NOTE — Telephone Encounter (Signed)
-----   Message from Larina Bras, [place] sent at 07/14/2018 11:23 AM EDT ----- Check pt ef... If too low, must reschedule endo to hospital!

## 2018-07-24 NOTE — Telephone Encounter (Signed)
Patient's EF is still between 25-30%. Too low for [facility]. Any idea when we could schedule? Or wait until January schedule becomes available? [facility] appt has been cancelled.

## 2018-07-24 NOTE — Patient Instructions (Addendum)
Medication Instructions:  Your physician recommends that you continue on your current medications as directed. Please refer to the Current Medication list given to you today.  If you need a refill on your cardiac medications before your next appointment, please call your pharmacy.   Lab work: None Ordered  If you have labs (blood work) drawn today and your tests are completely normal, you will receive your results only by: Marland Kitchen MyChart Message (if you have MyChart) OR . A paper copy in the mail If you have any lab test that is abnormal or we need to change your treatment, we will call you to review the results.   Testing/Procedures: None Ordered   Follow-Up: Your physician recommends that you schedule a follow-up appointment in: soon with Dr. Lovena Le for CRT   At [facility], you and your health needs are our priority.  As part of our continuing mission to provide you with exceptional heart care, we have created designated Provider Care Teams.  These Care Teams include your primary Cardiologist (physician) and Advanced Practice Providers (APPs -  Physician Assistants and Nurse Practitioners) who all work together to provide you with the care you need, when you need it. You will need a follow up appointment in:  3 months.  Please call our office 2 months in advance to schedule this appointment.  You may see [name], MD or one of the following Advanced Practice Providers on your designated Care Team: Richardson Dopp, PA-C [place], [place] . Daune Perch, NP

## 2018-07-24 NOTE — Telephone Encounter (Signed)
Patient returned phone call states she understands she is having EGD at [facility] on 08/07/18.

## 2018-08-02 ENCOUNTER — Encounter: Payer: Medicare Other | Admitting: Internal Medicine

## 2018-08-03 ENCOUNTER — Encounter: Payer: Self-pay | Admitting: Hematology and Oncology

## 2018-08-03 ENCOUNTER — Telehealth: Payer: Self-pay | Admitting: Hematology and Oncology

## 2018-08-03 ENCOUNTER — Inpatient Hospital Stay: Payer: Medicare Other

## 2018-08-03 ENCOUNTER — Inpatient Hospital Stay: Payer: Medicare Other | Attending: Hematology and Oncology | Admitting: Hematology and Oncology

## 2018-08-03 VITALS — BP 113/57 | HR 88 | Temp 98.9°F | Resp 18 | Ht 59.0 in | Wt 201.1 lb

## 2018-08-03 DIAGNOSIS — D649 Anemia, unspecified: Secondary | ICD-10-CM

## 2018-08-03 DIAGNOSIS — D539 Nutritional anemia, unspecified: Secondary | ICD-10-CM | POA: Diagnosis not present

## 2018-08-03 DIAGNOSIS — E785 Hyperlipidemia, unspecified: Secondary | ICD-10-CM | POA: Insufficient documentation

## 2018-08-03 DIAGNOSIS — D520 Dietary folate deficiency anemia: Secondary | ICD-10-CM

## 2018-08-03 LAB — CBC WITH DIFFERENTIAL (CANCER CENTER ONLY)
ABS IMMATURE GRANULOCYTES: 0.02 10*3/uL (ref 0.00–0.07)
BASOS ABS: 0 10*3/uL (ref 0.0–0.1)
BASOS PCT: 0 %
Eosinophils Absolute: 0.1 10*3/uL (ref 0.0–0.5)
Eosinophils Relative: 1 %
HCT: 27.2 % — ABNORMAL LOW (ref 36.0–46.0)
Hemoglobin: 8.8 g/dL — ABNORMAL LOW (ref 12.0–15.0)
IMMATURE GRANULOCYTES: 0 %
LYMPHS PCT: 29 %
Lymphs Abs: 1.7 10*3/uL (ref 0.7–4.0)
MCH: 34.4 pg — AB (ref 26.0–34.0)
MCHC: 32.4 g/dL (ref 30.0–36.0)
MCV: 106.3 fL — ABNORMAL HIGH (ref 80.0–100.0)
Monocytes Absolute: 0.3 10*3/uL (ref 0.1–1.0)
Monocytes Relative: 4 %
NEUTROS ABS: 3.9 10*3/uL (ref 1.7–7.7)
NEUTROS PCT: 66 %
NRBC: 0.8 % — AB (ref 0.0–0.2)
PLATELETS: 358 10*3/uL (ref 150–400)
RBC: 2.56 MIL/uL — AB (ref 3.87–5.11)
RDW: 21.5 % — ABNORMAL HIGH (ref 11.5–15.5)
WBC Count: 5.9 10*3/uL (ref 4.0–10.5)

## 2018-08-03 LAB — DIRECT ANTIGLOBULIN TEST (NOT AT ARMC)
DAT, COMPLEMENT: NEGATIVE
DAT, IgG: NEGATIVE

## 2018-08-03 LAB — COMPREHENSIVE METABOLIC PANEL
ALBUMIN: 3.5 g/dL (ref 3.5–5.0)
ALT: 11 U/L (ref 0–44)
AST: 11 U/L — ABNORMAL LOW (ref 15–41)
Alkaline Phosphatase: 73 U/L (ref 38–126)
Anion gap: 11 (ref 5–15)
BUN: 14 mg/dL (ref 8–23)
CO2: 32 mmol/L (ref 22–32)
CREATININE: 0.9 mg/dL (ref 0.44–1.00)
Calcium: 9.5 mg/dL (ref 8.9–10.3)
Chloride: 98 mmol/L (ref 98–111)
GFR calc non Af Amer: >60 mL/min (ref >60)
GLUCOSE: 108 mg/dL — AB (ref 70–99)
Potassium: 3.4 mmol/L — ABNORMAL LOW (ref 3.5–5.1)
SODIUM: 141 mmol/L (ref 135–145)
Total Bilirubin: 1.2 mg/dL (ref 0.3–1.2)
Total Protein: 7 g/dL (ref 6.5–8.1)

## 2018-08-03 LAB — TSH: TSH: 1.092 u[IU]/mL (ref 0.308–3.960)

## 2018-08-03 LAB — RETIC PANEL
Immature Retic Fract: 25.3 % — ABNORMAL HIGH (ref 2.3–15.9)
RBC.: 2.56 MIL/uL — ABNORMAL LOW (ref 3.87–5.11)
Retic Count, Absolute: 47.6 10*3/uL (ref 19.0–186.0)
Retic Ct Pct: 1.9 % (ref 0.4–3.1)
Reticulocyte Hemoglobin: 32 pg (ref >27.9)

## 2018-08-03 LAB — C-REACTIVE PROTEIN: CRP: 3.2 mg/dL — ABNORMAL HIGH (ref <1.0)

## 2018-08-03 LAB — FERRITIN: Ferritin: 47 ng/mL (ref 11–307)

## 2018-08-03 NOTE — Telephone Encounter (Signed)
Gave pt avs and calendar  °

## 2018-08-03 NOTE — Patient Instructions (Signed)
We discussed in detail the results of your prior lab work.  Laboratory studies were obtained today to identify the cause of your underlying anemia.  Both your white blood cell and platelet count are normal.  Barring any unforeseen complications, your neck scheduled doctor visit to discuss those results is on August 25, 2018.  Please do not hesitate to call should any new or untoward problems arise.

## 2018-08-03 NOTE — Progress Notes (Signed)
Outpatient Hematology/Oncology Initial Consultation  Patient Name:  [name]  DOB: 1946/04/18   Date of Service: August 03, 2018  Referring Physician: Zenovia Jarred MD  Primary Care Physician: Tedra Senegal, MD [address] [place],  [id number]   Consulting Physician: Henreitta Leber, MD Hematology/Oncology  Reason for Referral: In the setting of anemia of unclear etiology, she presents now for further diagnostic and therapeutic recommendations.  History Present Illness: [name] is a 72 year old resident of [place] whose past medical history is significant for dyslipidemia; compensated systolic congestive heart failure; placement of a defibrillator in 2011; nonischemic cardiomyopathy; bilateral nonsurgical cataracts; hypothyroid disease; mitral regurgitation; elevated BMI; diverticular disease; gastroesophageal reflux disease with dysphagia; and a serrated sessile polyp (2) identified initially 3 years earlier. Her primary care physician is Dr. Tedra Senegal.  Her gastroenterologist is Dr. Zenovia Jarred.  Over the past 6 months, she was aware that she was anemic. She is alone at this first visit.  A review of her laboratory studies from May 01, 2015 show hemoglobin 11.6 hematocrit 33.3 WBC 3.8; platelets 307,000.  On December 05, 2015: A complete blood count showed hemoglobin 11.1 hematocrit 33.4 WBC 5.6; platelets 339,000.  On May 03, 2017: A complete blood count showed hemoglobin 10.9 hematocrit 33.9 MCV 103.7 MCH 33.3 RDW 17.4 WBC 5.0 with 47% neutrophils 43% lymphocytes 7% monocytes 2% eosinophils 1% basophil; platelets 349,000.  More recently, on May 12, 2018: A complete blood count showed hemoglobin 9.7 hematocrit 29.0 MCV 101.4 MCH 33.9 RDW 17.3 WBC 4.2 with 47% neutrophils 43% lymphocytes 7% monocytes 2% eosinophils 1% basophil; platelets 333,000.  On May 25, 2018: A complete blood count showed hemoglobin 9.6 hematocrit 28.8 MCV 101.4 MCH 33.8 RDW  17.9 WBC 3.8 with 39% neutrophils 51% lymphocytes 7% monocytes 2% eosinophils 1% basophil; platelets 338,000.  She has had in the past 2 colonoscopies.  In 2015 a Cologuard was positive.  A serrated sessile polyp was removed from the ascending colon at that time.  A follow-up colonoscopy one year later on February 25, 2015 revealed a second 5 mm sessile serrated polyp in the descending colon. The prior polypectomy site in the ascending colon showed no residual polyp.  She denies any bright red blood per rectum or melena.  She has no significant change in bowel habits although her stools vary from constipation to loose to normal.  She denies abdominal pain or tenesmus.  She has had dyspepsia and dysphagia in spite of omeprazole. She denies odynophagia. Solid food seems to get stuck in her epigastrium.  Endoscopic evaluation is anticipated.  She denies any appetite or significant weight deficit. She has no rash or itching. She denies headache, syncope or near syncopal episodes.  She has positional dizziness. She denies hearing deficit. She has a dry cough over the past 2 weeks without sore throat. She has chronic but stable exertional dyspnea. She has dysphagia to solids without odynophagia.  No fever, shaking chills, sweats, or flulike symptoms are reported.  She has episodic dyspepsia while on omeprazole.  She reports no viral hepatitis, inflammatory bowel disease, or symptomatic diverticular disease.  She reports no melena or bright red blood per rectum.  No urinary frequency, urgency, hematuria, or dysuria are reported.  She has swelling of her ankles which recede overnight.  She complains of degenerative joint disease involving her shoulders and lower back.  She has no bleeding tendency or easy bruisability.  She has no peripheral arterial venous thromboembolic disease.  There are no new arthralgias  or myalgias. She denies numbness or tingling in the fingers or toes. She has been on folic acid the past year.  She  is taking no iron supplements. There are no new medications within the past 3 months.  It is with this background she presents now for further diagnostic and therapeutic recommendations in the setting of anemia as outlined above.  Past Medical History:  Diagnosis Date  . Automatic implantable cardioverter-defibrillator in situ    Dr. Beckie Salts follows-next visit- [phone]  . Cataract   . Diverticulosis   . Dyslipidemia   . Ejection fraction < 50%    EF 25-30%, September, 2010  . GERD (gastroesophageal reflux disease)   . Hypothyroidism   . ICD (implantable cardiac defibrillator) in place    10/2009; Dr. Lovena Le  . LBBB (left bundle branch block)    old  . Macrocytic anemia   . Mitral regurgitation    Mild, echo, September, 2010  . Nonischemic cardiomyopathy ([place])    Normal coronary arteries, catheterization, September, 2009  . Overweight(278.02)   . Serrated polyp of colon   . Systolic CHF, chronic (HCC)    no recent problems   Past Surgical History:  Procedure Laterality Date  . CARDIAC CATHETERIZATION     '09 last  . [place]    . COLONOSCOPY N/A 11/29/2013   Procedure: COLONOSCOPY;  Surgeon: Jerene Bears, MD;  Location: [facility];  Service: Gastroenterology;  Laterality: N/A;  . COLONOSCOPY N/A 02/25/2015   Procedure: COLONOSCOPY;  Surgeon: Jerene Bears, MD;  Location: [facility];  Service: Gastroenterology;  Laterality: N/A;  . EYE SURGERY Left    x 3-"droopy eyelid"  . heart catherization    . TONSILLECTOMY  1952   Gynecologic History: Her menarche was at age 10 years. Her last menses was at the age of 59 years. She is nulliparous. She was on hormone replacement therapy for 5 years beginning age 53 years. In her earlier years she was never on oral contraception. Her last mammogram was in September. She had a right cyst aspirated on her breast 3 years ago. Her last Pap smear was 2 years ago, reportedly normal.  Family History  Problem  Relation Age of Onset  . Heart attack Mother   . Heart disease Mother   . Hypertension Mother   . Lung cancer Father   . Pancreatic cancer Father   . Cancer Father   . Stroke Paternal Grandfather   . Colon cancer Neg Hx   . Diabetes Neg Hx   . Kidney disease Neg Hx   . Liver disease Neg Hx   Mother: Deceased: Age 55 years: CAD/MI Father: Deceased: Age 63 years: Lung cancer She has no brothers or sisters  Social History   Socioeconomic History  . Marital status: Single    Spouse name: Not on file  . Number of children: Not on file  . Years of education: Not on file  . Highest education level: Not on file  Occupational History  . Not on file  Social Needs  . Financial resource strain: Not on file  . Food insecurity:    Worry: Not on file    Inability: Not on file  . Transportation needs:    Medical: Not on file    Non-medical: Not on file  Tobacco Use  . Smoking status: Never Smoker  . Smokeless tobacco: Never Used  Substance and Sexual Activity  . Alcohol use: Yes    Comment: 2-4 beers per  week  . Drug use: No  . Sexual activity: Never    Birth control/protection: Abstinence  Lifestyle  . Physical activity:    Days per week: Not on file    Minutes per session: Not on file  . Stress: Not on file  Relationships  . Social connections:    Talks on phone: Not on file    Gets together: Not on file    Attends religious service: Not on file    Active member of club or organization: Not on file    Attends meetings of clubs or organizations: Not on file    Relationship status: Not on file  . Intimate partner violence:    Fear of current or ex partner: Not on file    Emotionally abused: Not on file    Physically abused: Not on file    Forced sexual activity: Not on file  Other Topics Concern  . Not on file  Social History Narrative  . Not on file  [name] is single, never married. She does clerical work.   She is a lifetime non-smoker.   Her alcohol  intake in the past has been heavy including vodka, beer, scotch. More recently she is lost the taste and now drinks in moderation over the past 6 months, usually beer infrequently. She reports no recreational drug use.  Transfusion History: No prior transfusion  Exposure History: She has no known exposure to toxic chemicals, radiation, or pesticides.  Allergies:  She has no known medical allergies No food allergies She has nonspecific seasonal allergies  Current Outpatient Medications on File Prior to Visit  Medication Sig  . albuterol (PROVENTIL HFA;VENTOLIN HFA) 108 (90 Base) MCG/ACT inhaler Inhale 2 puffs into the lungs every 6 (six) hours as needed for wheezing or shortness of breath.  . carvedilol (COREG) 25 MG tablet TAKE 1 TABLET BY MOUTH TWICE A DAY (Patient taking differently: Take 25 mg by mouth 2 (two) times daily with a meal. )  . folic acid (FOLVITE) 1 MG tablet Take 1 tablet (1 mg total) by mouth daily.  . furosemide (LASIX) 40 MG tablet TAKE 1 TABLET BY MOUTH EVERY DAY (Patient taking differently: Take 40 mg by mouth daily. )  . glucose blood (ACCU-CHEK AVIVA) test strip Check daily.  For Diabetes 250.00 (Patient not taking: Reported on 08/01/2018)  . ipratropium-albuterol (DUONEB) 0.5-2.5 (3) MG/3ML SOLN Take 3 mLs by nebulization every 4 (four) hours as needed (shortness of breath or wheezing).  . Lancets MISC Check glucose daily.  Diabetes 250.00 (Patient not taking: Reported on 08/01/2018)  . levothyroxine (SYNTHROID, LEVOTHROID) 150 MCG tablet TAKE 1 TABLET BY MOUTH EVERY DAY (Patient taking differently: Take 150 mcg by mouth daily before breakfast. )  . losartan (COZAAR) 25 MG tablet TAKE 1 TABLET BY MOUTH EVERY DAY (Patient taking differently: Take 25 mg by mouth daily. )  . omeprazole (PRILOSEC) 20 MG capsule Take 1 capsule (20 mg total) by mouth 2 (two) times daily before a meal.  . Propylene Glycol (SYSTANE BALANCE) 0.6 % SOLN Place 1 drop into both eyes daily as  needed (for dry eyes).   No current facility-administered medications on file prior to visit.     Review of Systems: Constitutional: No fever, sweats, or shaking chills. No appetite or weight deficit.  Energy level low. Skin: No rash, scaling, sores, lumps, or jaundice. HEENT: No visual changes or hearing deficit; bilateral cataracts. Pulmonary: She has a dry cough over the past 2 weeks.  No  sore throat. Breasts: No complaints. Cardiovascular: No coronary artery disease; systolic heart failure; nonischemic cardiomyopathy; no cardiac dysrhythmia or essential hypertension; dyslipidemia. Gastrointestinal: No abdominal pain, diarrhea, or constipation. No change in bowel habits. No nausea or vomiting.  No melena or bright red blood per rectum. Episodic dyspepsia and dysphagia to solids in the epigastrium. Genitourinary: No urinary frequency, urgency, hematuria, or dysuria. Musculoskeletal: No new arthralgias or myalgias; chronic arthralgias in the shoulders and lower back; no joint swelling or instability. Hematologic: No bleeding tendency or easy bruisability; anemia. Endocrine: No intolerance to hot or cold; hypothyroid disease; prediabetes mellitus. Vascular: No peripheral arterial or venous thromboembolic disease. Psychological: No anxiety, depression, or mood changes; no mental health illnesses. Neurological: No syncope, or near syncopal episodes; no numbness or tingling in the fingers or toes.  She has positional dizziness  Physical Examination: Vital Signs: Body surface area is 1.95 meters squared.  Vitals:   08/03/18 1236  BP: (!) 113/57  Pulse: 88  Resp: 18  Temp: 98.9 F (37.2 C)  SpO2: 93%    Filed Weights   08/03/18 1236  Weight: 201 lb 1.6 oz (91.2 kg)  ECOG PERFORMANCE STATUS: 1 Constitutional:  [name] is fully nourished and developed albeit overweight. She looks age appropriate. She is friendly and cooperative without respiratory compromise at rest. Skin: No  rashes, scaling, dryness, jaundice, or itching pallor. HEENT: Head is normocephalic and atraumatic.  Pupils are equal round and reactive to light and accommodation.  Sclerae are anicteric.  Conjunctivae are pale.  No sinus tenderness nor oropharyngeal lesions.  Lips without cracking or peeling; tongue without mass, inflammation, or nodularity.  Mucous membranes are moist. Neck: Supple and symmetric.  No jugular venous distention or thyromegaly.  Trachea is midline. Lymphatics: No cervical or supraclavicular lymphadenopathy.  No epitrochlear, axillary, or inguinal lymphadenopathy is appreciated. Respiratory/chest: Thorax is symmetrical.  Breath sounds are distant but clear to auscultation and percussion.  Normal excursion and respiratory effort. Back: Symmetric without deformity or tenderness. Cardiovascular: Heart rate and rhythm are regular. Gastrointestinal: Abdomen is protuberant, soft, nontender; no organomegaly.  Bowel sounds are normoactive.  No masses are appreciated. Extremities: In the lower extremities, there is no asymmetric swelling, erythema, tenderness, or cord formation.  No clubbing or cyanosis.  She has 1+ bipedal pitting edema. Hematologic: No petechiae, hematomas, or ecchymoses. Psychological:  She is oriented to person, place, and time; normal affect. Neurological: There are no gross neurologic deficits.  Laboratory Results: I have reviewed the data as listed: August 03, 2018  Ref Range & Units 14:12 (08/03/18) 83moago (05/25/18) 214mogo (05/12/18) 1y6yro (05/03/17)  WBC Count 4.0 - 10.5 K/uL 5.9  3.8 R 4.2 R 5.0 R, CM  RBC 3.87 - 5.11 MIL/uL 2.56Low   2.84Low  R 2.86Low  R 3.27Low  R  Hemoglobin 12.0 - 15.0 g/dL 8.8Low   9.6Low  R 9.7Low  R 10.9Low  R  HCT 36.0 - 46.0 % 27.2Low   28.8Low  R 29.0Low  R 33.9Low  R  MCV 80.0 - 100.0 fL 106.3High   101.4High   101.4High   103.7High    MCH 26.0 - 34.0 pg 34.4High   33.8High  R 33.9High  R 33.3High  R  MCHC 30.0 - 36.0 g/dL  32.4  33.3 R 33.4 R 32.2 R  RDW 11.5 - 15.5 % 21.5High   17.9High  R 17.3High  R 17.4High  R  Platelet Count 150 - 400 K/uL 358  338 R 333 R  349 R  nRBC 0.0 - 0.2 % 0.8High       Neutrophils Relative % % 66  39.4  46.5  42   Neutro Abs 1.7 - 7.7 K/uL 3.9  1,497Low  R 1,953 R 2,100 R  Lymphocytes Relative % 29    46   Lymphs Abs 0.7 - 4.0 K/uL 1.7  1,919 R 1,819 R 2,300 R  Monocytes Relative % 4  7.0  7.0  8   Monocytes Absolute 0.1 - 1.0 K/uL 0.3    400 R  Eosinophils Relative % 1  1.8  2.2  3   Eosinophils Absolute 0.0 - 0.5 K/uL 0.1  68 R 92 R 150 R  Basophils Relative % 0  1.3  1.0  1   Basophils Absolute 0.0 - 0.1 K/uL 0.0  49 R 42 R 50 R  Immature Granulocytes % 0      Abs Immature Granulocytes 0.00 - 0.07 K/uL 0.02      Comment: Performed at [facility], [place] [address], [place], [place] [address]  MPV   10.4  10.4  9.5   WBC mixed population   266  294    Total Lymphocyte   50.5  43.3    Smear Review     Criteria for review not met     Ref Range & Units 14:12 (08/03/18) 50moago (05/12/18) 129yrgo (05/03/17) 1y36yro (02/28/17)  Sodium 135 - 145 mmol/L 141  143 R 143 R 146High  R  Potassium 3.5 - 5.1 mmol/L 3.4Low   3.8 R 3.8 R 3.7 R  Chloride 98 - 111 mmol/L 98  100 R 102 R 102 R  CO2 22 - 32 mmol/L 32  35High  R 27 R, CM 27 R, CM  Glucose, Bld 70 - 99 mg/dL 108High   102High  R, CM 106High  R 76 R  BUN 8 - 23 mg/dL 14  16 R 15 R 25 R  Creatinine, Ser 0.44 - 1.00 mg/dL 0.90  0.82 R, CM 0.88 R, CM 1.00 R  Calcium 8.9 - 10.3 mg/dL 9.5  9.1 R 9.3 R 9.5 R  Total Protein 6.5 - 8.1 g/dL 7.0  6.2 R 6.4 R   Albumin 3.5 - 5.0 g/dL 3.5   4.1 R   AST 15 - 41 U/L 11Low   10 R 11 R   ALT 0 - 44 U/L 11  8 R 11 R   Alkaline Phosphatase 38 - 126 U/L 73   79 R   Total Bilirubin 0.3 - 1.2 mg/dL 1.2  1.1 R 0.7 R   GFR calc non Af Amer >60 mL/min >60  72 R 67 R 57Low  R  GFR calc Af Amer >60 mL/min >60  83 R 77 R 66 R   Diagnostic/Imaging Studies: January 09, 2018 CHEST - 2 VIEW  COMPARISON:  Chest x-ray of November 23, 2016.  FINDINGS: The lungs are well-expanded. There is no focal infiltrate. There is no pleural effusion. The cardiac silhouette is enlarged. The central pulmonary vascularity is prominent. There is no interstitial or alveolar edema. The ICD is in stable position. The bony thorax exhibits no acute abnormality. There is multilevel degenerative disc disease of the thoracic spine.  IMPRESSION: Mild cardiomegaly and central pulmonary vascular congestion slightly more conspicuous today. No pulmonary edema or pneumonia.  David  [email or web address].  01/09/2018   Summary/Assessment: In the setting of anemia of unclear etiology, she presents  now for further diagnostic and therapeutic recommendations.  A review of her laboratory studies from May 01, 2015 show hemoglobin 11.6 hematocrit 33.3 WBC 3.8; platelets 307,000.  On December 05, 2015: A complete blood count showed hemoglobin 11.1 hematocrit 33.4 WBC 5.6; platelets 339,000.  On May 03, 2017: A complete blood count showed hemoglobin 10.9 hematocrit 33.9 MCV 103.7 MCH 33.3 RDW 17.4 WBC 5.0 with 47% neutrophils 43% lymphocytes 7% monocytes 2% eosinophils 1% basophil; platelets 349,000.  More recently, on May 12, 2018: A complete blood count showed hemoglobin 9.7 hematocrit 29.0 MCV 101.4 MCH 33.9 RDW 17.3 WBC 4.2 with 47% neutrophils 43% lymphocytes 7% monocytes 2% eosinophils 1% basophil; platelets 333,000.  On May 25, 2018: A complete blood count showed hemoglobin 9.6 hematocrit 28.8 MCV 101.4 MCH 33.8 RDW 17.9 WBC 3.8 with 39% neutrophils 51% lymphocytes 7% monocytes 2% eosinophils 1% basophil; platelets 338,000.  She has had in the past 2 colonoscopies.  In 2015 a Cologuard was positive.  A serrated sessile polyp was removed from the ascending colon at that time.  A follow-up colonoscopy one year later on February 25, 2015 revealed a second 5 mm sessile serrated polyp in the  descending colon. The prior polypectomy site in the ascending colon showed no residual polyp.  She denies any bright red blood per rectum or melena.  She has no significant change in bowel habits although her stools vary from constipation to loose to normal.  She denies abdominal pain or tenesmus.  She has had dyspepsia and dysphagia in spite of omeprazole.  She denies odynophagia.  Solid food seems to get stuck in her epigastrium.  Endoscopic evaluation is anticipated.  She denies any appetite or significant weight deficit. She has no rash or itching. She denies headache, syncope or near syncopal episodes. She has positional dizziness. She denies hearing deficit. She has a dry cough over the past 2 weeks without sore throat. She has chronic but stable exertional dyspnea. She has dysphagia to solids without odynophagia.  No fever, shaking chills, sweats, or flulike symptoms are reported. She has episodic dyspepsia while on omeprazole.  She reports no viral hepatitis, inflammatory bowel disease, or symptomatic diverticular disease.  She reports no melena or bright red blood per rectum. No urinary frequency, urgency, hematuria, or dysuria are reported. She has swelling of her ankles which recede overnight. She complains of degenerative joint disease involving her shoulders and lower back.  She has no bleeding tendency or easy bruisability. She has no peripheral arterial venous thromboembolic disease. There are no new arthralgias or myalgias. She denies numbness or tingling in the fingers or toes. She has been on folic acid the past year.  She is taking no iron supplements. There are no new medications within the past 3 months.   Her other comorbid problems include dyslipidemia; compensated systolic congestive heart failure; placement of a defibrillator in 2011; nonischemic cardiomyopathy; bilateral cataracts; hypothyroid disease; mitral regurgitation; elevated BMI; diverticular disease; gastroesophageal reflux  disease with dysphagia; and a serrated sessile polyp (2) identified initially 3 years earlier.  Recommendation/Plan: I discussed with Romesha her prior laboratory studies and reviewed her extensive and complex medical history.  The results of her laboratory studies from today were not available at the time of discharge.  Some of those results are detailed above.  A battery of laboratory studies were obtained to exclude an underlying hemolytic process, metabolic anomaly, iron deficiency, paraproteinemia, and hepatitis screening.  Because her absolute neutrophil and lymphocyte were reversed on September 12,  peripheral blood for flow cytometry was sent to exclude an underlying lymphoproliferative process. Those results are pending.  Barring any unforeseen complications, her next scheduled doctor visit is on December 13 to discuss those results.  At that time she will be introduced to a new provider since I am leaving the practice.  She was advised to call us in the interim should any new or untoward problems arise.  The total time spent discussing her history, most recent laboratory studies, physical examination, role, rationale, and methodology for evaluating anemia of unclear etiology with recommendations was 60 minutes.  At least 50% of that time was spent in face to face discussion, counseling, and answering questions. There was ample time allotted to answer all questions.  This note was dictated using voice activated technology/software.  Unfortunately, typographical errors are not uncommon, and transcription is subject to mistakes and regrettably misinterpretation.  If necessary, clarification of the above information can be discussed with me at any time.  FOLLOW UP: AS DIRECTED   cc:        Zenovia Jarred MD              Tedra Senegal, MD   Henreitta Leber, MD  Hematology/Oncology [facility] [address] [place], [place] [address] Office: [phone] [facility]NH: [phone]

## 2018-08-04 LAB — PROTEIN ELECTROPHORESIS, SERUM
A/G Ratio: 1.3 (ref 0.7–1.7)
ALPHA-2-GLOBULIN: 0.6 g/dL (ref 0.4–1.0)
Albumin ELP: 3.5 g/dL (ref 2.9–4.4)
Alpha-1-Globulin: 0.3 g/dL (ref 0.0–0.4)
Beta Globulin: 0.9 g/dL (ref 0.7–1.3)
GLOBULIN, TOTAL: 2.8 g/dL (ref 2.2–3.9)
Gamma Globulin: 1 g/dL (ref 0.4–1.8)
TOTAL PROTEIN ELP: 6.3 g/dL (ref 6.0–8.5)

## 2018-08-04 LAB — HOMOCYSTEINE: Homocysteine: 9.3 umol/L (ref 0.0–15.0)

## 2018-08-04 LAB — KAPPA/LAMBDA LIGHT CHAINS
KAPPA FREE LGHT CHN: 36.5 mg/L — AB (ref 3.3–19.4)
Kappa, lambda light chain ratio: 1.59 (ref 0.26–1.65)
LAMDA FREE LIGHT CHAINS: 22.9 mg/L (ref 5.7–26.3)

## 2018-08-04 LAB — HAPTOGLOBIN: HAPTOGLOBIN: 141 mg/dL (ref 34–200)

## 2018-08-04 LAB — HEPATITIS C ANTIBODY: HCV Ab: <0.1 s/co ratio (ref 0.0–0.9)

## 2018-08-06 LAB — METHYLMALONIC ACID, SERUM: Methylmalonic Acid, Quantitative: 209 nmol/L (ref 0–378)

## 2018-08-07 ENCOUNTER — Other Ambulatory Visit: Payer: Self-pay | Admitting: Internal Medicine

## 2018-08-07 ENCOUNTER — Other Ambulatory Visit: Payer: Self-pay

## 2018-08-07 ENCOUNTER — Ambulatory Visit (HOSPITAL_COMMUNITY): Payer: Medicare Other | Admitting: Anesthesiology

## 2018-08-07 ENCOUNTER — Encounter (HOSPITAL_COMMUNITY): Payer: Self-pay | Admitting: *Deleted

## 2018-08-07 ENCOUNTER — Ambulatory Visit (HOSPITAL_COMMUNITY)
Admission: RE | Admit: 2018-08-07 | Discharge: 2018-08-07 | Disposition: A | Discharge status: Home / Self Care | Payer: Medicare Other | Source: Ambulatory Visit | Attending: Internal Medicine | Admitting: Internal Medicine

## 2018-08-07 ENCOUNTER — Encounter (HOSPITAL_COMMUNITY)
Admission: RE | Disposition: A | Discharge status: Home / Self Care | Payer: Self-pay | Source: Ambulatory Visit | Attending: Internal Medicine

## 2018-08-07 ENCOUNTER — Ambulatory Visit (HOSPITAL_COMMUNITY): Payer: Medicare Other

## 2018-08-07 DIAGNOSIS — R0989 Other specified symptoms and signs involving the circulatory and respiratory systems: Secondary | ICD-10-CM | POA: Diagnosis not present

## 2018-08-07 DIAGNOSIS — Z95810 Presence of automatic (implantable) cardiac defibrillator: Secondary | ICD-10-CM | POA: Insufficient documentation

## 2018-08-07 DIAGNOSIS — K5900 Constipation, unspecified: Secondary | ICD-10-CM | POA: Insufficient documentation

## 2018-08-07 DIAGNOSIS — K317 Polyp of stomach and duodenum: Secondary | ICD-10-CM | POA: Diagnosis not present

## 2018-08-07 DIAGNOSIS — I447 Left bundle-branch block, unspecified: Secondary | ICD-10-CM

## 2018-08-07 DIAGNOSIS — D539 Nutritional anemia, unspecified: Secondary | ICD-10-CM | POA: Insufficient documentation

## 2018-08-07 DIAGNOSIS — R1310 Dysphagia, unspecified: Secondary | ICD-10-CM | POA: Diagnosis not present

## 2018-08-07 DIAGNOSIS — I5022 Chronic systolic (congestive) heart failure: Secondary | ICD-10-CM

## 2018-08-07 DIAGNOSIS — R0902 Hypoxemia: Secondary | ICD-10-CM | POA: Diagnosis not present

## 2018-08-07 DIAGNOSIS — R0683 Snoring: Secondary | ICD-10-CM

## 2018-08-07 DIAGNOSIS — R7981 Abnormal blood-gas level: Secondary | ICD-10-CM

## 2018-08-07 DIAGNOSIS — K2950 Unspecified chronic gastritis without bleeding: Secondary | ICD-10-CM | POA: Insufficient documentation

## 2018-08-07 DIAGNOSIS — D649 Anemia, unspecified: Secondary | ICD-10-CM | POA: Diagnosis not present

## 2018-08-07 DIAGNOSIS — K219 Gastro-esophageal reflux disease without esophagitis: Secondary | ICD-10-CM | POA: Diagnosis not present

## 2018-08-07 DIAGNOSIS — Z79899 Other long term (current) drug therapy: Secondary | ICD-10-CM | POA: Insufficient documentation

## 2018-08-07 DIAGNOSIS — K3189 Other diseases of stomach and duodenum: Secondary | ICD-10-CM | POA: Diagnosis not present

## 2018-08-07 DIAGNOSIS — E785 Hyperlipidemia, unspecified: Secondary | ICD-10-CM | POA: Insufficient documentation

## 2018-08-07 DIAGNOSIS — E039 Hypothyroidism, unspecified: Secondary | ICD-10-CM | POA: Diagnosis not present

## 2018-08-07 HISTORY — PX: BIOPSY: SHX5522

## 2018-08-07 HISTORY — PX: ESOPHAGOGASTRODUODENOSCOPY (EGD) WITH PROPOFOL: SHX5813

## 2018-08-07 LAB — PATHOLOGIST SMEAR REVIEW

## 2018-08-07 SURGERY — ESOPHAGOGASTRODUODENOSCOPY (EGD) WITH PROPOFOL
Anesthesia: Monitor Anesthesia Care

## 2018-08-07 MED ORDER — PROPOFOL 500 MG/50ML IV EMUL
INTRAVENOUS | Status: DC | PRN
  Administered 2018-08-07: 75 ug/kg/min via INTRAVENOUS

## 2018-08-07 MED ORDER — PROPOFOL 500 MG/50ML IV EMUL
INTRAVENOUS | Status: DC | PRN
  Administered 2018-08-07: 30 mg via INTRAVENOUS

## 2018-08-07 MED ORDER — LACTATED RINGERS IV SOLN
INTRAVENOUS | Status: DC
  Administered 2018-08-07: 1000 mL via INTRAVENOUS

## 2018-08-07 MED ORDER — ONDANSETRON HCL 4 MG/2ML IJ SOLN
INTRAMUSCULAR | Status: DC | PRN
  Administered 2018-08-07: 4 mg via INTRAVENOUS

## 2018-08-07 MED ORDER — SODIUM CHLORIDE 0.9 % IV SOLN: INTRAVENOUS | Status: DC

## 2018-08-07 MED ORDER — PHENYLEPHRINE HCL 10 MG/ML IJ SOLN
INTRAMUSCULAR | Status: DC | PRN
  Administered 2018-08-07 (×2): 80 ug via INTRAVENOUS

## 2018-08-07 SURGICAL SUPPLY — 14 items

## 2018-08-07 NOTE — Interval H&P Note (Signed)
History and Physical Interval Note: Patient presents for upper endoscopy to evaluate indigestion and dysphagia symptom. Being evaluated for macrocytic anemia by hematology.  Seen last week with labs pending and has follow-up next month. Ferritin normal, no evidence for iron deficiency Rule out reflux esophagitis, stricture.  We discussed possible dilation depending on findings Indigestion and heartburn have improved since increasing to twice daily PPI earlier this month when seen in the office.  Dysphagia symptoms stable but not improved with PPI dose change The nature of the procedure, as well as the risks, benefits, and alternatives were carefully and thoroughly reviewed with the patient. Ample time for discussion and questions allowed. The patient understood, was satisfied, and agreed to proceed.     08/07/2018 10:01 AM  [name]  has presented today for surgery, with the diagnosis of dysphagia, gerd  The various methods of treatment have been discussed with the patient and family. After consideration of risks, benefits and other options for treatment, the patient has consented to  Procedure(s): ESOPHAGOGASTRODUODENOSCOPY (EGD) WITH PROPOFOL (N/A) as a surgical intervention .  The patient's history has been reviewed, patient examined, no change in status, stable for surgery.  I have reviewed the patient's chart and labs.  Questions were answered to the patient's satisfaction.     [name]

## 2018-08-07 NOTE — Discharge Instructions (Signed)
YOU HAD AN ENDOSCOPIC PROCEDURE TODAY: Refer to the procedure report and other information in the discharge instructions given to you for any specific questions about what was found during the examination. If this information does not answer your questions, please call [place] office at [phone] to clarify.  ° °YOU SHOULD EXPECT: Some feelings of bloating in the abdomen. Passage of more gas than usual. Walking can help get rid of the air that was put into your GI tract during the procedure and reduce the bloating. If you had a lower endoscopy (such as a colonoscopy or flexible sigmoidoscopy) you may notice spotting of blood in your stool or on the toilet paper. Some abdominal soreness may be present for a day or two, also. ° °DIET: Your first meal following the procedure should be a light meal and then it is ok to progress to your normal diet. A half-sandwich or bowl of soup is an example of a good first meal. Heavy or fried foods are harder to digest and may make you feel nauseous or bloated. Drink plenty of fluids but you should avoid alcoholic beverages for 24 hours. If you had a esophageal dilation, please see attached instructions for diet.   ° °ACTIVITY: Your care partner should take you home directly after the procedure. You should plan to take it easy, moving slowly for the rest of the day. You can resume normal activity the day after the procedure however YOU SHOULD NOT DRIVE, use power tools, machinery or perform tasks that involve climbing or major physical exertion for 24 hours (because of the sedation medicines used during the test).  ° °SYMPTOMS TO REPORT IMMEDIATELY: °A gastroenterologist can be reached at any hour. Please call [phone]  for any of the following symptoms:  °Following lower endoscopy (colonoscopy, flexible sigmoidoscopy) °Excessive amounts of blood in the stool  °Significant tenderness, worsening of abdominal pains  °Swelling of the abdomen that is new, acute  °Fever of 100° or  higher  °Following upper endoscopy (EGD, EUS, ERCP, esophageal dilation) °Vomiting of blood or coffee ground material  °New, significant abdominal pain  °New, significant chest pain or pain under the shoulder blades  °Painful or persistently difficult swallowing  °New shortness of breath  °Black, tarry-looking or red, bloody stools ° °FOLLOW UP:  °If any biopsies were taken you will be contacted by phone or by letter within the next 1-3 weeks. Call [phone]  if you have not heard about the biopsies in 3 weeks.  °Please also call with any specific questions about appointments or follow up tests. ° °

## 2018-08-07 NOTE — Transfer of Care (Signed)
Immediate Anesthesia Transfer of Care Note  Patient: ILINA XU  Procedure(s) Performed: Procedure(s): ESOPHAGOGASTRODUODENOSCOPY (EGD) WITH PROPOFOL (N/A) BIOPSY  Patient Location: PACU  Anesthesia Type:MAC  Level of Consciousness:  sedated, patient cooperative and responds to stimulation  Airway & Oxygen Therapy:Patient Spontanous Breathing and Patient connected to face mask oxgen  Post-op Assessment:  Report given to PACU RN and Post -op Vital signs reviewed and stable  Post vital signs:  Reviewed and stable  Last Vitals:  Vitals:   08/07/18 0905  BP: (!) 121/35  Pulse: 77  Resp: (!) 21  Temp: 36.7 C  SpO2: 66%    Complications: No apparent anesthesia complications

## 2018-08-07 NOTE — Progress Notes (Signed)
Okay to discharge patient home per Dr. Linna Caprice. Dr. Linna Caprice aware of oxygen saturation and blood pressure. Pt is asymptomatic, no distress noted.

## 2018-08-07 NOTE — Consult Note (Signed)
Cardiology Consultation:   Patient ID: [name] MRN: [id number]; DOB: [birth year]  Admit date: 08/07/2018 Date of Consult: 08/07/2018  Primary Care Provider: Elby Showers, MD Primary Cardiologist: [name], MD    Patient Profile:   [name] is a 72 y.o. female with a hx of chronic systolic congestive heart failure, left bundle branch block who is being seen today for the evaluation of hypoxemia at the request of Dr. Linna Caprice. Marland Kitchen  History of Present Illness:   [name] has a history of chronic systolic congestive heart failure.  Her ejection fraction is 25 to 30%.  Also has grade 2 diastolic dysfunction. She had an upper endoscopy today.   Some episodes of hypoxemia following her procedure.  These episodes were very transient and always corrected.  Recorded O2 saturations into the low 70s and occasionally high 60s. Her skin color remained unchanged these procedures which led [place] to believe that perhaps it was a sensor error.  Is not having any labored breathing. She admits to eating a little bit more salt than usual. Is had some PND and orthopnea recently. She is been working and is been on her feet quite a bit. She is had a dry cough.  She denies any sputum production.   Denies any nausea or vomiting.  Denies any leg swelling.   Past Medical History:  Diagnosis Date  . Automatic implantable cardioverter-defibrillator in situ    Dr. Beckie Salts follows-next visit- [phone]  . Cataract   . Diverticulosis   . Dyslipidemia   . Ejection fraction < 50%    EF 25-30%, September, 2010  . GERD (gastroesophageal reflux disease)   . Hypothyroidism   . ICD (implantable cardiac defibrillator) in place    10/2009; Dr. Lovena Le  . LBBB (left bundle branch block)    old  . Macrocytic anemia   . Mitral regurgitation    Mild, echo, September, 2010  . Nonischemic cardiomyopathy ([place])    Normal coronary arteries, catheterization, September, 2009  . Overweight(278.02)   .  Serrated polyp of colon   . Systolic CHF, chronic (HCC)    no recent problems    Past Surgical History:  Procedure Laterality Date  . CARDIAC CATHETERIZATION     '09 last  . [place]    . COLONOSCOPY N/A 11/29/2013   Procedure: COLONOSCOPY;  Surgeon: Jerene Bears, MD;  Location: [facility];  Service: Gastroenterology;  Laterality: N/A;  . COLONOSCOPY N/A 02/25/2015   Procedure: COLONOSCOPY;  Surgeon: Jerene Bears, MD;  Location: [facility];  Service: Gastroenterology;  Laterality: N/A;  . EYE SURGERY Left    x 3-"droopy eyelid"  . heart catherization    . TONSILLECTOMY  1952     Home Medications:  Prior to Admission medications   Medication Sig Start Date End Date Taking? Authorizing Provider  carvedilol (COREG) 25 MG tablet TAKE 1 TABLET BY MOUTH TWICE A DAY Patient taking differently: Take 25 mg by mouth 2 (two) times daily with a meal.  04/27/18  Yes Brylen Wagar, Wonda Cheng, MD  folic acid (FOLVITE) 1 MG tablet Take 1 tablet (1 mg total) by mouth daily. 10/31/17  Yes Baxley, Cresenciano Lick, MD  furosemide (LASIX) 40 MG tablet TAKE 1 TABLET BY MOUTH EVERY DAY Patient taking differently: Take 40 mg by mouth daily.  04/27/18  Yes Tamarra Geiselman, Wonda Cheng, MD  levothyroxine (SYNTHROID, LEVOTHROID) 150 MCG tablet TAKE 1 TABLET BY MOUTH EVERY DAY Patient taking differently: Take 150 mcg by  mouth daily before breakfast.  06/12/18  Yes Baxley, Cresenciano Lick, MD  losartan (COZAAR) 25 MG tablet TAKE 1 TABLET BY MOUTH EVERY DAY Patient taking differently: Take 25 mg by mouth daily.  04/28/18  Yes Evans Lance, MD  omeprazole (PRILOSEC) 20 MG capsule Take 1 capsule (20 mg total) by mouth 2 (two) times daily before a meal. 07/14/18  Yes Pyrtle, Lajuan Lines, MD  Propylene Glycol (SYSTANE BALANCE) 0.6 % SOLN Place 1 drop into both eyes daily as needed (for dry eyes).   Yes [provider]  albuterol (PROVENTIL HFA;VENTOLIN HFA) 108 (90 Base) MCG/ACT inhaler Inhale 2 puffs into the lungs every 6  (six) hours as needed for wheezing or shortness of breath. 11/23/16   Elby Showers, MD  glucose blood (ACCU-CHEK AVIVA) test strip Check daily.  For Diabetes 250.00 05/29/14   Elby Showers, MD  ipratropium-albuterol (DUONEB) 0.5-2.5 (3) MG/3ML SOLN Take 3 mLs by nebulization every 4 (four) hours as needed (shortness of breath or wheezing).    [provider]  Lancets MISC Check glucose daily.  Diabetes 250.00 05/29/14   Elby Showers, MD    Inpatient Medications: Scheduled Meds:  Continuous Infusions: . lactated ringers 1,000 mL (08/07/18 0939)   PRN Meds:   Allergies:   No Known Allergies  Social History:   Social History   Socioeconomic History  . Marital status: Single    Spouse name: Not on file  . Number of children: Not on file  . Years of education: Not on file  . Highest education level: Not on file  Occupational History  . Not on file  Social Needs  . Financial resource strain: Not on file  . Food insecurity:    Worry: Not on file    Inability: Not on file  . Transportation needs:    Medical: Not on file    Non-medical: Not on file  Tobacco Use  . Smoking status: Never Smoker  . Smokeless tobacco: Never Used  Substance and Sexual Activity  . Alcohol use: Yes    Comment: 2-4 beers per week  . Drug use: No  . Sexual activity: Never    Birth control/protection: Abstinence  Lifestyle  . Physical activity:    Days per week: Not on file    Minutes per session: Not on file  . Stress: Not on file  Relationships  . Social connections:    Talks on phone: Not on file    Gets together: Not on file    Attends religious service: Not on file    Active member of club or organization: Not on file    Attends meetings of clubs or organizations: Not on file    Relationship status: Not on file  . Intimate partner violence:    Fear of current or ex partner: Not on file    Emotionally abused: Not on file    Physically abused: Not on file    Forced sexual  activity: Not on file  Other Topics Concern  . Not on file  Social History Narrative  . Not on file    Family History:    Family History  Problem Relation Age of Onset  . Heart attack Mother   . Heart disease Mother   . Hypertension Mother   . Lung cancer Father   . Pancreatic cancer Father   . Cancer Father   . Stroke Paternal Grandfather   . Colon cancer Neg Hx   . Diabetes Neg  Hx   . Kidney disease Neg Hx   . Liver disease Neg Hx      ROS:  Please see the history of present illness.   All other ROS reviewed and negative.     Physical Exam/Data:   Vitals:   08/07/18 1255 08/07/18 1300 08/07/18 1305 08/07/18 1310  BP: (!) 93/51 (!) 109/47  (!) 96/58  Pulse: (!) 101 100 (!) 101 100  Resp: (!) 24 20 19 14   Temp:      TempSrc:      SpO2: 98% 97% 98% 97%  Weight:      Height:        Intake/Output Summary (Last 24 hours) at 08/07/2018 1444 Last data filed at 08/07/2018 1033 Gross per 24 hour  Intake 200 ml  Output -  Net 200 ml   Filed Weights   08/07/18 0905  Weight: 91.2 kg   Body mass index is 40.62 kg/m.  General: Elderly female, no acute distress HEENT: normal Lymph: no adenopathy Neck: no JVD Endocrine:  No thryomegaly Vascular: No carotid bruits; FA pulses 2+ bilaterally without bruits  Cardiac: Rate S1-S2. Lungs:  clear to auscultation bilaterally, no wheezing, rhonchi or rales  Abd: soft, nontender, no hepatomegaly  Ext: no edema Musculoskeletal:  No deformities, BUE and BLE strength normal and equal Skin: warm and dry  Neuro:  CNs 2-12 intact, no focal abnormalities noted Psych:  Normal affect   EKG: Sinus rhythm left bundle branch block. Telemetry:    Relevant CV Studies:   Laboratory Data:  Chemistry Recent Labs  Lab 08/03/18 1412  NA 141  K 3.4*  CL 98  CO2 32  GLUCOSE 108*  BUN 14  CREATININE 0.90  CALCIUM 9.5  GFRNONAA >60  GFRAA >60  ANIONGAP 11    Recent Labs  Lab 08/03/18 1412  PROT 7.0  ALBUMIN 3.5  AST  11*  ALT 11  ALKPHOS 73  BILITOT 1.2   Hematology Recent Labs  Lab 08/03/18 1412  WBC 5.9  RBC 2.56*  2.56*  HGB 8.8*  HCT 27.2*  MCV 106.3*  MCH 34.4*  MCHC 32.4  RDW 21.5*  PLT 358   Cardiac EnzymesNo results for input(s): TROPONINI in the last 168 hours. No results for input(s): TROPIPOC in the last 168 hours.  BNPNo results for input(s): BNP, PROBNP in the last 168 hours.  DDimer No results for input(s): DDIMER in the last 168 hours.  Radiology/Studies:  Dg Chest Port 1 View  Result Date: 08/07/2018 CLINICAL DATA:  Decreased oxygen saturation after upper endoscopy today. EXAM: PORTABLE CHEST 1 VIEW COMPARISON:  PA and lateral chest 01/09/2018 and 11/23/2016. FINDINGS: The lungs are clear. There is cardiomegaly. No pneumothorax or pleural effusion. AICD is in place. No acute bony abnormality. IMPRESSION: No acute disease. Electronically Signed   By: Inge Rise M.D.   On: 08/07/2018 13:30    Assessment and Plan:   1.  Hypoxemia: It is unclear if the hypoxemia is real or not.  We tried various transducers and could not get any of them to work very well.  It may be that her fingers are large and we are not getting the true reading.  Either way the hypoxemia is very transient and corrects without any additional measures.  Her color remains unchanged.      Chest x-ray reveals a bilateral pulmonary edema.  I recommended that she stop eating so much salt.  She admits to eating lots of salt in her diet  recently. Considered adding Entresto in place of losartan but her blood pressure still low that I am not sure that she would tolerate this. I have made her an appointment to see Dr. Lovena Le to upgrade her ICD to a biventricular ICD.  The friend who was with her today said that she snores.  I think that she probably needs a sleep study.  We will contact Dr. Verlene Mayer office to arrange that.  She appears to be stable to leave the endoscopy unit.      For questions or  updates, please contact [place] Please consult [email or web address] for contact info under     Signed, [name], MD  08/07/2018 2:44 PM

## 2018-08-07 NOTE — Op Note (Signed)
[facility] Patient Name: [name] Procedure Date: 08/07/2018 MRN: [id number] Attending MD: Jerene Bears , MD Date of Birth: 1945-10-11 CSN: [id number] Age: 72 Admit Type: Outpatient Procedure:                Upper GI endoscopy Indications:              Dysphagia, Gastro-esophageal reflux disease Providers:                Lajuan Lines. Hilarie Fredrickson, MD, Cleda Daub, RN, William Dalton, Technician Referring MD:             Cresenciano Lick. Baxley Medicines:                Monitored Anesthesia Care Complications:            No immediate complications. Estimated Blood Loss:     Estimated blood loss was minimal. Procedure:                Pre-Anesthesia Assessment:                           - Prior to the procedure, a History and Physical                            was performed, and patient medications and                            allergies were reviewed. The patient's tolerance of                            previous anesthesia was also reviewed. The risks                            and benefits of the procedure and the sedation                            options and risks were discussed with the patient.                            All questions were answered, and informed consent                            was obtained. Prior Anticoagulants: The patient has                            taken no previous anticoagulant or antiplatelet                            agents. ASA Grade Assessment: III - A patient with                            severe systemic disease. After reviewing the risks  and benefits, the patient was deemed in                            satisfactory condition to undergo the procedure.                           After obtaining informed consent, the endoscope was                            passed under direct vision. Throughout the                            procedure, the patient's blood pressure, pulse, and                     oxygen saturations were monitored continuously. The                            GIF-H190 ([id number]) Olympus adult endoscope was                            introduced through the mouth, and advanced to the                            second part of duodenum. The upper GI endoscopy was                            accomplished without difficulty. The patient                            tolerated the procedure well. Scope In: Scope Out: Findings:      No endoscopic abnormality was evident in the esophagus to explain the       patient's complaint of dysphagia.      A single 7 mm semi-sessile polyp was found on the greater curvature of       the gastric antrum. The polyp was removed with a piecemeal technique       using a cold biopsy forceps. Resection and retrieval were complete.      The exam of the stomach was otherwise normal.      Biopsies were taken with a cold forceps in the gastric body, at the       incisura and in the gastric antrum for histology and Helicobacter pylori       testing.      The examined duodenum was normal. Impression:               - No endoscopic esophageal abnormality to explain                            patient's dysphagia.                           - A single gastric polyp. Resected and retrieved.                           - Normal examined duodenum.                           -  Biopsies were taken with a cold forceps for                            histology and Helicobacter pylori testing. Moderate Sedation:      N/A Recommendation:           - Patient has a contact number available for                            emergencies. The signs and symptoms of potential                            delayed complications were discussed with the                            patient. Return to normal activities tomorrow.                            Written discharge instructions were provided to the                            patient.                            - Resume previous diet.                           - Continue present medications.                           - Await pathology results.                           - If dysphagia persists then perform outpatient                            barium esophagram with tablet to evaluate                            esophageal motility. Procedure Code(s):        --- Professional ---                           [phone], Esophagogastroduodenoscopy, flexible,                            transoral; with biopsy, single or multiple Diagnosis Code(s):        --- Professional ---                           R13.10, Dysphagia, unspecified                           K31.7, Polyp of stomach and duodenum                           K21.9, Gastro-esophageal reflux disease without  esophagitis CPT copyright 2018 American Medical Association. All rights reserved. The codes documented in this report are preliminary and upon coder review may  be revised to meet current compliance requirements. Jerene Bears, MD 08/07/2018 10:56:54 AM This report has been signed electronically. Number of Addenda: 0

## 2018-08-07 NOTE — Progress Notes (Signed)
Anesthesiology Note:  As noted [name] is a 72 year old female with nonischemic cardiomyopathy with left ventricular ejection fraction of 25 to 62% mild systolic HF.  She has a chronic left bundle branch block and a defibrillator which has never discharged.   Today she underwent an upper endoscopy to evaluate symptoms of dysphagia.  This was done under MAC anesthesia with propofol which the patient tolerated well.  In the recovery room she was awake and alert but her oxygen saturations would frequently drop into the 60s and 70s on room air.  They would improved to the 90s with nasal oxygen and deep breathing.  She denied dyspnea and appeared comfortable with good color. Her respiratory status appeared stable and the low O2 Sats may have been an artifact.  There were a few crackles at the bases but otherwise her lungs sounded clear.  A chest x-ray showed prominent pulmonary vascular trigger and possible mild pulmonary edema.  I consulted Dr. Acie Fredrickson and we both evaluate the patient and felt she was stable and appeared okay for discharge.  Dr. Remi Deter suggested she may need a sleep study and the patient will follow up with Dr. Tommie Ard Baxley her primary care physician.  She will also see Dr. Crissie Sickles for consideration of CRT therapy with her defibrillator.  [name], MD

## 2018-08-07 NOTE — Anesthesia Postprocedure Evaluation (Signed)
Anesthesia Post Note  Patient: [name]  Procedure(s) Performed: ESOPHAGOGASTRODUODENOSCOPY (EGD) WITH PROPOFOL (N/A ) BIOPSY     Patient location during evaluation: Endoscopy Anesthesia Type: MAC Level of consciousness: awake and alert Pain management: pain level controlled Vital Signs Assessment: post-procedure vital signs reviewed and stable Respiratory status: spontaneous breathing, nonlabored ventilation and respiratory function stable Cardiovascular status: stable and blood pressure returned to baseline Postop Assessment: no apparent nausea or vomiting Anesthetic complications: no    Last Vitals:  Vitals:   08/07/18 1430 08/07/18 1445  BP: (!) 101/43 (!) 83/34  Pulse: 78 78  Resp: 16 (!) 24  Temp:    SpO2: (!) 88% (!) 79%    Last Pain:  Vitals:   08/07/18 1200  TempSrc:   PainSc: 0-No pain                 Merlyn Conley COKER

## 2018-08-07 NOTE — Anesthesia Preprocedure Evaluation (Signed)
Anesthesia Evaluation  Patient identified by MRN, date of birth, ID band Patient awake    Reviewed: Allergy & Precautions, NPO status , Patient's Chart, lab work & pertinent test results  Airway Mallampati: II   Neck ROM: Full    Dental  (+) Teeth Intact, Dental Advisory Given   Pulmonary    breath sounds clear to auscultation       Cardiovascular  Rhythm:Regular Rate:Normal     Neuro/Psych    GI/Hepatic   Endo/Other    Renal/GU      Musculoskeletal   Abdominal   Peds  Hematology   Anesthesia Other Findings   Reproductive/Obstetrics                             Anesthesia Physical Anesthesia Plan  ASA: III  Anesthesia Plan: MAC   Post-op Pain Management:    Induction:   PONV Risk Score and Plan: Ondansetron and Propofol infusion  Airway Management Planned: Natural Airway and Nasal Cannula  Additional Equipment:   Intra-op Plan:   Post-operative Plan:   Informed Consent: I have reviewed the patients History and Physical, chart, labs and discussed the procedure including the risks, benefits and alternatives for the proposed anesthesia with the patient or authorized representative who has indicated his/her understanding and acceptance.   Dental advisory given  Plan Discussed with: CRNA and Anesthesiologist  Anesthesia Plan Comments:         Anesthesia Quick Evaluation

## 2018-08-08 ENCOUNTER — Encounter: Payer: Self-pay | Admitting: Internal Medicine

## 2018-08-08 LAB — IMMUNOFIXATION ELECTROPHORESIS
IGA: 171 mg/dL (ref 64–422)
IGM (IMMUNOGLOBULIN M), SRM: 79 mg/dL (ref 26–217)
IgG (Immunoglobin G), Serum: 1140 mg/dL (ref 700–1600)
Total Protein ELP: 6.3 g/dL (ref 6.0–8.5)

## 2018-08-09 ENCOUNTER — Encounter (HOSPITAL_COMMUNITY): Payer: Self-pay | Admitting: Internal Medicine

## 2018-08-09 LAB — FLOW CYTOMETRY

## 2018-08-16 ENCOUNTER — Ambulatory Visit (INDEPENDENT_AMBULATORY_CARE_PROVIDER_SITE_OTHER): Payer: Medicare Other | Admitting: Internal Medicine

## 2018-08-16 ENCOUNTER — Encounter: Payer: Self-pay | Admitting: Internal Medicine

## 2018-08-16 VITALS — BP 124/64 | HR 74 | Ht 59.0 in | Wt 197.0 lb

## 2018-08-16 DIAGNOSIS — I5022 Chronic systolic (congestive) heart failure: Secondary | ICD-10-CM

## 2018-08-16 DIAGNOSIS — I10 Essential (primary) hypertension: Secondary | ICD-10-CM

## 2018-08-16 DIAGNOSIS — Z95810 Presence of automatic (implantable) cardiac defibrillator: Secondary | ICD-10-CM | POA: Diagnosis not present

## 2018-08-16 LAB — CUP PACEART INCLINIC DEVICE CHECK
Date Time Interrogation Session: 20191204050000
HIGH POWER IMPEDANCE MEASURED VALUE: 38 Ohm
HIGH POWER IMPEDANCE MEASURED VALUE: 52 Ohm
Implantable Lead Implant Date: 20110221
Implantable Lead Location: 753859
Implantable Lead Location: 753860
Implantable Lead Serial Number: 301957
Implantable Pulse Generator Implant Date: 20110221
Lead Channel Impedance Value: 456 Ohm
Lead Channel Pacing Threshold Pulse Width: 0.4 ms
Lead Channel Pacing Threshold Pulse Width: 0.4 ms
Lead Channel Sensing Intrinsic Amplitude: 19.8 mV
Lead Channel Sensing Intrinsic Amplitude: 4.9 mV
Lead Channel Setting Pacing Amplitude: 2 V
Lead Channel Setting Sensing Sensitivity: 0.6 mV
MDC IDC LEAD IMPLANT DT: 20110221
MDC IDC MSMT LEADCHNL RA IMPEDANCE VALUE: 432 Ohm
MDC IDC MSMT LEADCHNL RA PACING THRESHOLD AMPLITUDE: 0.5 V
MDC IDC MSMT LEADCHNL RV PACING THRESHOLD AMPLITUDE: 0.3 V
MDC IDC SET LEADCHNL RV PACING AMPLITUDE: 2.4 V
MDC IDC SET LEADCHNL RV PACING PULSEWIDTH: 0.4 ms
Pulse Gen Serial Number: 160860

## 2018-08-16 NOTE — Patient Instructions (Signed)

## 2018-08-16 NOTE — Progress Notes (Signed)
HPI Ms. Hott returns today for followup. She is a very pleasant19 year old woman with a nonischemic cardiomyopathy, chronic class II -lll systolic heart failure, obesity, status post ICD implantation. In the interim, she has she has done well except for some sob. No ICD shocks. Her main complaint is related to sob. She admits to dietary indiscretion.  No Known Allergies   Current Outpatient Medications  Medication Sig Dispense Refill  . albuterol (PROVENTIL HFA;VENTOLIN HFA) 108 (90 Base) MCG/ACT inhaler Inhale 2 puffs into the lungs every 6 (six) hours as needed for wheezing or shortness of breath. 1 Inhaler prn  . carvedilol (COREG) 25 MG tablet TAKE 1 TABLET BY MOUTH TWICE A DAY 413 tablet 2  . folic acid (FOLVITE) 1 MG tablet Take 1 tablet (1 mg total) by mouth daily. 90 tablet 3  . furosemide (LASIX) 40 MG tablet TAKE 1 TABLET BY MOUTH EVERY DAY 90 tablet 2  . glucose blood (ACCU-CHEK AVIVA) test strip Check daily.  For Diabetes 250.00 100 each 12  . ipratropium-albuterol (DUONEB) 0.5-2.5 (3) MG/3ML SOLN Take 3 mLs by nebulization every 4 (four) hours as needed (shortness of breath or wheezing).    . Lancets MISC Check glucose daily.  Diabetes 250.00 100 each 11  . levothyroxine (SYNTHROID, LEVOTHROID) 150 MCG tablet TAKE 1 TABLET BY MOUTH EVERY DAY 90 tablet 0  . losartan (COZAAR) 25 MG tablet TAKE 1 TABLET BY MOUTH EVERY DAY 90 tablet 2  . omeprazole (PRILOSEC) 20 MG capsule Take 1 capsule (20 mg total) by mouth 2 (two) times daily before a meal. 180 capsule 1  . Propylene Glycol (SYSTANE BALANCE) 0.6 % SOLN Place 1 drop into both eyes daily as needed (for dry eyes).     No current facility-administered medications for this visit.      Past Medical History:  Diagnosis Date  . Automatic implantable cardioverter-defibrillator in situ    Dr. Beckie Salts follows-next visit- [phone]  . Cataract   . Diverticulosis   . Dyslipidemia   . Ejection fraction < 50%    EF 25-30%,  September, 2010  . GERD (gastroesophageal reflux disease)   . Hypothyroidism   . ICD (implantable cardiac defibrillator) in place    10/2009; Dr. Lovena Le  . LBBB (left bundle branch block)    old  . Macrocytic anemia   . Mitral regurgitation    Mild, echo, September, 2010  . Nonischemic cardiomyopathy ([place])    Normal coronary arteries, catheterization, September, 2009  . Overweight(278.02)   . Serrated polyp of colon   . Systolic CHF, chronic (HCC)    no recent problems    ROS:   All systems reviewed and negative except as noted in the HPI.   Past Surgical History:  Procedure Laterality Date  . BIOPSY  08/07/2018   Procedure: BIOPSY;  Surgeon: Jerene Bears, MD;  Location: [facility];  Service: Gastroenterology;;  . CARDIAC CATHETERIZATION     '09 last  . [place]    . COLONOSCOPY N/A 11/29/2013   Procedure: COLONOSCOPY;  Surgeon: Jerene Bears, MD;  Location: [facility];  Service: Gastroenterology;  Laterality: N/A;  . COLONOSCOPY N/A 02/25/2015   Procedure: COLONOSCOPY;  Surgeon: Jerene Bears, MD;  Location: [facility];  Service: Gastroenterology;  Laterality: N/A;  . ESOPHAGOGASTRODUODENOSCOPY (EGD) WITH PROPOFOL N/A 08/07/2018   Procedure: ESOPHAGOGASTRODUODENOSCOPY (EGD) WITH PROPOFOL;  Surgeon: Jerene Bears, MD;  Location: [facility];  Service: Gastroenterology;  Laterality: N/A;  .  EYE SURGERY Left    x 3-"droopy eyelid"  . heart catherization    . TONSILLECTOMY  1952     Family History  Problem Relation Age of Onset  . Heart attack Mother   . Heart disease Mother   . Hypertension Mother   . Lung cancer Father   . Pancreatic cancer Father   . Cancer Father   . Stroke Paternal Grandfather   . Colon cancer Neg Hx   . Diabetes Neg Hx   . Kidney disease Neg Hx   . Liver disease Neg Hx      Social History   Socioeconomic History  . Marital status: Single    Spouse name: Not on file  . Number of children: Not on file  .  Years of education: Not on file  . Highest education level: Not on file  Occupational History  . Not on file  Social Needs  . Financial resource strain: Not on file  . Food insecurity:    Worry: Not on file    Inability: Not on file  . Transportation needs:    Medical: Not on file    Non-medical: Not on file  Tobacco Use  . Smoking status: Never Smoker  . Smokeless tobacco: Never Used  Substance and Sexual Activity  . Alcohol use: Yes    Comment: 2-4 beers per week  . Drug use: No  . Sexual activity: Never    Birth control/protection: Abstinence  Lifestyle  . Physical activity:    Days per week: Not on file    Minutes per session: Not on file  . Stress: Not on file  Relationships  . Social connections:    Talks on phone: Not on file    Gets together: Not on file    Attends religious service: Not on file    Active member of club or organization: Not on file    Attends meetings of clubs or organizations: Not on file    Relationship status: Not on file  . Intimate partner violence:    Fear of current or ex partner: Not on file    Emotionally abused: Not on file    Physically abused: Not on file    Forced sexual activity: Not on file  Other Topics Concern  . Not on file  Social History Narrative  . Not on file     BP 124/64   Pulse 74   Ht 4\' 11"  (1.499 m)   Wt 197 lb (89.4 kg)   BMI 39.79 kg/m   Physical Exam:  Well appearing NAD HEENT: Unremarkable Neck:  No JVD, no thyromegally Lymphatics:  No adenopathy Back:  No CVA tenderness Lungs:  Clear HEART:  Regular rate rhythm, no murmurs, no rubs, no clicks Abd:  soft, positive bowel sounds, no organomegally, no rebound, no guarding Ext:  2 plus pulses, no edema, no cyanosis, no clubbing Skin:  No rashes no nodules Neuro:  CN II through XII intact, motor grossly intact  EKG - nsr with LBBB  DEVICE  Normal device function.  See PaceArt for details.   Assess/Plan: 1. Chronic systolic heart failure - her  symptoms are class 2-3. I would recommend she work harder to avoid the need to upgrade her device. I think we can wait on this for now. 2. ICD - her Nelida Meuse DDD ICD is working normally. We discussed upgrade to a biv but I think she should wait on this for now. 3. Obesity - I have encouraged  her to lose weight.  4. COPD - she will continue her bronchodilators. No change.   Mikle Bosworth.D.

## 2018-08-20 ENCOUNTER — Other Ambulatory Visit: Payer: Self-pay | Admitting: Internal Medicine

## 2018-08-21 ENCOUNTER — Ambulatory Visit (INDEPENDENT_AMBULATORY_CARE_PROVIDER_SITE_OTHER): Payer: Medicare Other

## 2018-08-21 DIAGNOSIS — I428 Other cardiomyopathies: Secondary | ICD-10-CM

## 2018-08-21 DIAGNOSIS — I5022 Chronic systolic (congestive) heart failure: Secondary | ICD-10-CM

## 2018-08-22 NOTE — Progress Notes (Signed)
Remote ICD transmission.   

## 2018-08-25 ENCOUNTER — Ambulatory Visit: Payer: Medicare Other | Admitting: Hematology and Oncology

## 2018-08-28 ENCOUNTER — Inpatient Hospital Stay: Payer: Medicare Other | Attending: Hematology and Oncology | Admitting: Nurse Practitioner

## 2018-08-28 ENCOUNTER — Telehealth: Payer: Self-pay

## 2018-08-28 VITALS — BP 114/48 | HR 81 | Temp 97.7°F | Resp 18 | Ht 59.0 in | Wt 197.1 lb

## 2018-08-28 DIAGNOSIS — Z79899 Other long term (current) drug therapy: Secondary | ICD-10-CM

## 2018-08-28 DIAGNOSIS — D649 Anemia, unspecified: Secondary | ICD-10-CM | POA: Diagnosis not present

## 2018-08-28 DIAGNOSIS — E039 Hypothyroidism, unspecified: Secondary | ICD-10-CM | POA: Insufficient documentation

## 2018-08-28 DIAGNOSIS — E785 Hyperlipidemia, unspecified: Secondary | ICD-10-CM | POA: Diagnosis not present

## 2018-08-28 DIAGNOSIS — Z95810 Presence of automatic (implantable) cardiac defibrillator: Secondary | ICD-10-CM | POA: Diagnosis not present

## 2018-08-28 NOTE — Progress Notes (Signed)
CONSULT NOTE  Patient Care Team: Elby Showers, MD as PCP - General (Internal Medicine) Nahser, Wonda Cheng, MD as PCP - Cardiology (Cardiology)  CHIEF COMPLAINTS/PURPOSE OF CONSULTATION:  Anemia  HISTORY OF PRESENTING ILLNESS:  [name] 72 y.o. female is here because of persistent, progressive anemia.  Please see Dr. Julien Girt notes (office visit 08/03/2018) for a more detailed history. We reviewed numerous labs today, most of which were within normal limits.  She was found to have abnormal CBC from August of this year.  05/12/2018 hemoglobin was 9.7 05/25/2018 hemoglobin 9.6 08/03/2018 hemoglobin 8.8  Unfortunately she did not have labs ordered prior to today's visit.  Because she was last appointment of the day I was unable to get them prior to her leaving today.  She will return tomorrow for repeat CBC and LDH.  She denies recent chest pain on exertion, shortness of breath on minimal exertion, pre-syncopal episodes, or palpitations.  She actually feels much better than she has on prior visits and feels hopeful that her hemoglobin is back up to normal.   She had not noticed any recent bleeding such as epistaxis, hematuria or hematochezia The patient denies over the counter NSAID ingestion. She is not on antiplatelets agents. Her last colonoscopy was in 2016, she recently had an EGD which was largely unremarkable in November of this year.  MEDICAL HISTORY:  Past Medical History:  Diagnosis Date  . Automatic implantable cardioverter-defibrillator in situ    Dr. Beckie Salts follows-next visit- [phone]  . Cataract   . Diverticulosis   . Dyslipidemia   . Ejection fraction < 50%    EF 25-30%, September, 2010  . GERD (gastroesophageal reflux disease)   . Hypothyroidism   . ICD (implantable cardiac defibrillator) in place    10/2009; Dr. Lovena Le  . LBBB (left bundle branch block)    old  . Macrocytic anemia   . Mitral regurgitation    Mild, echo, September, 2010  . Nonischemic  cardiomyopathy ([place])    Normal coronary arteries, catheterization, September, 2009  . Overweight(278.02)   . Serrated polyp of colon   . Systolic CHF, chronic (HCC)    no recent problems    SURGICAL HISTORY: Past Surgical History:  Procedure Laterality Date  . BIOPSY  08/07/2018   Procedure: BIOPSY;  Surgeon: Jerene Bears, MD;  Location: [facility];  Service: Gastroenterology;;  . CARDIAC CATHETERIZATION     '09 last  . [place]    . COLONOSCOPY N/A 11/29/2013   Procedure: COLONOSCOPY;  Surgeon: Jerene Bears, MD;  Location: [facility];  Service: Gastroenterology;  Laterality: N/A;  . COLONOSCOPY N/A 02/25/2015   Procedure: COLONOSCOPY;  Surgeon: Jerene Bears, MD;  Location: [facility];  Service: Gastroenterology;  Laterality: N/A;  . ESOPHAGOGASTRODUODENOSCOPY (EGD) WITH PROPOFOL N/A 08/07/2018   Procedure: ESOPHAGOGASTRODUODENOSCOPY (EGD) WITH PROPOFOL;  Surgeon: Jerene Bears, MD;  Location: [facility];  Service: Gastroenterology;  Laterality: N/A;  . EYE SURGERY Left    x 3-"droopy eyelid"  . heart catherization    . TONSILLECTOMY  1952    SOCIAL HISTORY: Social History   Socioeconomic History  . Marital status: Single    Spouse name: Not on file  . Number of children: Not on file  . Years of education: Not on file  . Highest education level: Not on file  Occupational History  . Not on file  Social Needs  . Financial resource strain: Not on file  . Food insecurity:  Worry: Not on file    Inability: Not on file  . Transportation needs:    Medical: Not on file    Non-medical: Not on file  Tobacco Use  . Smoking status: Never Smoker  . Smokeless tobacco: Never Used  Substance and Sexual Activity  . Alcohol use: Yes    Comment: 2-4 beers per week  . Drug use: No  . Sexual activity: Never    Birth control/protection: Abstinence  Lifestyle  . Physical activity:    Days per week: Not on file    Minutes per session: Not on file  .  Stress: Not on file  Relationships  . Social connections:    Talks on phone: Not on file    Gets together: Not on file    Attends religious service: Not on file    Active member of club or organization: Not on file    Attends meetings of clubs or organizations: Not on file    Relationship status: Not on file  . Intimate partner violence:    Fear of current or ex partner: Not on file    Emotionally abused: Not on file    Physically abused: Not on file    Forced sexual activity: Not on file  Other Topics Concern  . Not on file  Social History Narrative  . Not on file    FAMILY HISTORY: Family History  Problem Relation Age of Onset  . Heart attack Mother   . Heart disease Mother   . Hypertension Mother   . Lung cancer Father   . Pancreatic cancer Father   . Cancer Father   . Stroke Paternal Grandfather   . Colon cancer Neg Hx   . Diabetes Neg Hx   . Kidney disease Neg Hx   . Liver disease Neg Hx     ALLERGIES:  has No Known Allergies.  MEDICATIONS:  Current Outpatient Medications  Medication Sig Dispense Refill  . albuterol (PROVENTIL HFA;VENTOLIN HFA) 108 (90 Base) MCG/ACT inhaler Inhale 2 puffs into the lungs every 6 (six) hours as needed for wheezing or shortness of breath. 1 Inhaler prn  . carvedilol (COREG) 25 MG tablet TAKE 1 TABLET BY MOUTH TWICE A DAY 527 tablet 2  . folic acid (FOLVITE) 1 MG tablet Take 1 tablet (1 mg total) by mouth daily. 90 tablet 3  . furosemide (LASIX) 40 MG tablet TAKE 1 TABLET BY MOUTH EVERY DAY 90 tablet 2  . glucose blood (ACCU-CHEK AVIVA) test strip Check daily.  For Diabetes 250.00 100 each 12  . ipratropium-albuterol (DUONEB) 0.5-2.5 (3) MG/3ML SOLN Take 3 mLs by nebulization every 4 (four) hours as needed (shortness of breath or wheezing).    . Lancets MISC Check glucose daily.  Diabetes 250.00 100 each 11  . levothyroxine (SYNTHROID, LEVOTHROID) 150 MCG tablet TAKE 1 TABLET BY MOUTH EVERY DAY 90 tablet 0  . losartan (COZAAR) 25 MG  tablet TAKE 1 TABLET BY MOUTH EVERY DAY 90 tablet 2  . omeprazole (PRILOSEC) 20 MG capsule Take 1 capsule (20 mg total) by mouth 2 (two) times daily before a meal. 180 capsule 1  . Propylene Glycol (SYSTANE BALANCE) 0.6 % SOLN Place 1 drop into both eyes daily as needed (for dry eyes).     No current facility-administered medications for this visit.     REVIEW OF SYSTEMS:   Constitutional: Denies fevers, chills or abnormal night sweats Eyes: Denies blurriness of vision, double vision or watery eyes Ears, nose, mouth, throat,  and face: Denies mucositis or sore throat Respiratory: Denies cough, dyspnea or wheezes Cardiovascular: Denies palpitation, chest discomfort or lower extremity swelling Gastrointestinal:  Denies nausea, heartburn or change in bowel habits Skin: Denies abnormal skin rashes Lymphatics: Denies new lymphadenopathy or easy bruising Neurological:Denies numbness, tingling or new weaknesses Behavioral/Psych: Mood is stable, no new changes  All other systems were reviewed with the patient and are negative.  PHYSICAL EXAMINATION: ECOG PERFORMANCE STATUS: 0 - Asymptomatic  Vitals:   08/28/18 1540  BP: (!) 114/48  Pulse: 81  Resp: 18  Temp: 97.7 F (36.5 C)  SpO2: 96%   Filed Weights   08/28/18 1540  Weight: 197 lb 1.6 oz (89.4 kg)    GENERAL:alert, no distress and comfortable SKIN: skin color, texture, turgor are normal, no rashes or significant lesions EYES: normal, conjunctiva are pink and non-injected, sclera clear NECK: supple, thyroid normal size, non-tender, without nodularity LUNGS: clear to auscultation  HEART: regular rate & rhythm and no murmurs and no lower extremity edema Musculoskeletal:no cyanosis of digits and no clubbing  PSYCH: alert & oriented x 3 with fluent speech NEURO: no focal motor/sensory deficits  LABORATORY DATA:  I have reviewed the data as listed Recent Results (from the past 2160 hour(s))  CBC with Differential ([facility]  Only)     Status: Abnormal   Collection Time: 08/03/18  2:12 PM  Result Value Ref Range   WBC Count 5.9 4.0 - 10.5 K/uL   RBC 2.56 (L) 3.87 - 5.11 MIL/uL   Hemoglobin 8.8 (L) 12.0 - 15.0 g/dL   HCT 27.2 (L) 36.0 - 46.0 %   MCV 106.3 (H) 80.0 - 100.0 fL   MCH 34.4 (H) 26.0 - 34.0 pg   MCHC 32.4 30.0 - 36.0 g/dL   RDW 21.5 (H) 11.5 - 15.5 %   Platelet Count 358 150 - 400 K/uL   nRBC 0.8 (H) 0.0 - 0.2 %   Neutrophils Relative % 66 %   Neutro Abs 3.9 1.7 - 7.7 K/uL   Lymphocytes Relative 29 %   Lymphs Abs 1.7 0.7 - 4.0 K/uL   Monocytes Relative 4 %   Monocytes Absolute 0.3 0.1 - 1.0 K/uL   Eosinophils Relative 1 %   Eosinophils Absolute 0.1 0.0 - 0.5 K/uL   Basophils Relative 0 %   Basophils Absolute 0.0 0.0 - 0.1 K/uL   Immature Granulocytes 0 %   Abs Immature Granulocytes 0.02 0.00 - 0.07 K/uL    Comment: Performed at [facility], [address] [place], [place] [address]  Comprehensive metabolic panel     Status: Abnormal   Collection Time: 08/03/18  2:12 PM  Result Value Ref Range   Sodium 141 135 - 145 mmol/L   Potassium 3.4 (L) 3.5 - 5.1 mmol/L   Chloride 98 98 - 111 mmol/L   CO2 32 22 - 32 mmol/L   Glucose, Bld 108 (H) 70 - 99 mg/dL   BUN 14 8 - 23 mg/dL   Creatinine, Ser 0.90 0.44 - 1.00 mg/dL   Calcium 9.5 8.9 - 10.3 mg/dL   Total Protein 7.0 6.5 - 8.1 g/dL   Albumin 3.5 3.5 - 5.0 g/dL   AST 11 (L) 15 - 41 U/L   ALT 11 0 - 44 U/L   Alkaline Phosphatase 73 38 - 126 U/L   Total Bilirubin 1.2 0.3 - 1.2 mg/dL   GFR calc non Af Amer >60 >60 mL/min   GFR calc Af Amer >60 >  60 mL/min    Comment: (NOTE) The eGFR has been calculated using the CKD EPI equation. This calculation has not been validated in all clinical situations. eGFR's persistently <60 mL/min signify possible Chronic Kidney Disease.    Anion gap 11 5 - 15    Comment: Performed at [facility], [address], [place], [place] [address]  C-reactive  protein     Status: Abnormal   Collection Time: 08/03/18  2:12 PM  Result Value Ref Range   CRP 3.2 (H) <1.0 mg/dL    Comment: Performed at [facility] [address] [place], [place] [address]  Direct antiglobulin test (not at [facility])     Status: None   Collection Time: 08/03/18  2:12 PM  Result Value Ref Range   DAT, complement NEG    DAT, IgG      NEG Performed at [facility] [address] [place], [place] [address]   Ferritin     Status: None   Collection Time: 08/03/18  2:12 PM  Result Value Ref Range   Ferritin 47 11 - 307 ng/mL    Comment: Performed at [facility], [place] [address], [place], [place] [address]  Methylmalonic acid, serum     Status: None   Collection Time: 08/03/18  2:12 PM  Result Value Ref Range   Methylmalonic Acid, Quantitative 209 0 - 378 nmol/L   Disclaimer: Comment     Comment: (NOTE) This test was developed and its performance characteristics determined by [facility]. It has not been cleared or approved by the Food and Drug Administration. Performed At: [facility] [place], [place] [id number] Rush Farmer MD [id number]   Haptoglobin     Status: None   Collection Time: 08/03/18  2:12 PM  Result Value Ref Range   Haptoglobin 141 34 - 200 mg/dL    Comment: (NOTE)   **Effective August 28, 2018 Haptoglobin     reference interval will be changing to:            Age              Female             Female          0 -  6 months     Not Estab.       Not Estab.   7 months -  1 year       23 - 218         23 - 218          2 -  5 years      10 - 212         10 - 212          6 - 12 years      10 - 182         10 - 182         13 - 17 years      20 - 191         22 - 208         18 - 40 years      17 - 317         33 - 278         41 - 50 years      91 - 841         32 - 440  51 - 60 years      29 - 370         33 - 346         61 - 70 years      77 - 363         50  - 355         71 - 80 years      33 - 355         42 - 346             >80 years      62 - 329         41 - 333 Performed At: [facility] [place], [place] [id number] Rush Farmer MD VO:[id number]   Kappa/lambda light chains     Status: Abnormal   Collection Time: 08/03/18  2:12 PM  Result Value Ref Range   Kappa free light chain 36.5 (H) 3.3 - 19.4 mg/L   Lamda free light chains 22.9 5.7 - 26.3 mg/L   Kappa, lamda light chain ratio 1.59 0.26 - 1.65    Comment: (NOTE) Performed At: [facility] [place], [place] [id number] Rush Farmer MD [id number]   Retic Panel     Status: Abnormal   Collection Time: 08/03/18  2:12 PM  Result Value Ref Range   Retic Ct Pct 1.9 0.4 - 3.1 %   RBC. 2.56 (L) 3.87 - 5.11 MIL/uL   Retic Count, Absolute 47.6 19.0 - 186.0 K/uL   Immature Retic Fract 25.3 (H) 2.3 - 15.9 %   Reticulocyte Hemoglobin 32.0 >27.9 pg    Comment: Performed at [facility], [place] [address], [place], [place] [address]  Hepatitis C antibody     Status: None   Collection Time: 08/03/18  2:12 PM  Result Value Ref Range   HCV Ab <0.1 0.0 - 0.9 s/co ratio    Comment: (NOTE)                                  Negative:     < 0.8                             Indeterminate: 0.8 - 0.9                                  Positive:     > 0.9 The CDC recommends that a positive HCV antibody result be followed up with a HCV Nucleic Acid Amplification test (301601). Performed At: [facility] [place], [place] [id number] Rush Farmer MD [id number]   Flow Cytometry     Status: None   Collection Time: 08/03/18  2:12 PM  Result Value Ref Range   Flow Cytometry See Scanned report in [place]: Performed at [facility], [place] [address] [place], [place] [address]  Pathologist smear review     Status: None   Collection Time: 08/03/18  2:12 PM    Result Value Ref Range   Path Review Reviewed by Kalman Drape, M.D.     Comment: 11.22.19 MACROCYTIC ANEMIA WITH POLYCHROMASIA AND ELLIPTOCYTES AND RARE NRBCS. FEW HYPOGRANULAR NEUTROPHILS AND PSEUDO PELGER HUET FORMS. RARE BLAST. 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 Performed at [facility], [place] [address], [place], [place] [address]   Protein electrophoresis, serum     Status: None   Collection Time: 08/03/18  2:13 PM  Result Value Ref Range   Total Protein ELP 6.3 6.0 - 8.5 g/dL   Albumin ELP 3.5 2.9 - 4.4 g/dL   Alpha-1-Globulin 0.3 0.0 - 0.4 g/dL   Alpha-2-Globulin 0.6 0.4 - 1.0 g/dL   Beta Globulin 0.9 0.7 - 1.3 g/dL   Gamma Globulin 1.0 0.4 - 1.8 g/dL   M-Spike, % Not Observed Not Observed g/dL   SPE Interp. Comment     Comment: (NOTE) The SPE pattern appears essentially unremarkable. Evidence of monoclonal protein is not apparent. Performed At: [facility] [place], [place] [id number] Rush Farmer MD [id number]    Comment Comment     Comment: (NOTE) Protein electrophoresis scan will follow via computer, mail, or courier delivery.    GLOBULIN, TOTAL 2.8 2.2 - 3.9 g/dL   A/G Ratio 1.3 0.7 - 1.7  Immunofixation electrophoresis     Status: None   Collection Time: 08/03/18  2:13 PM  Result Value Ref Range   Total Protein ELP 6.3 6.0 - 8.5 g/dL   IgG (Immunoglobin G), Serum 1,140 700 - 1,600 mg/dL   IgA 171 64 - 422 mg/dL   IgM (Immunoglobulin M), Srm 79 26 - 217 mg/dL    Comment: (NOTE) Performed At: [facility] [place], [place] [id number] Rush Farmer MD WN:[id number]    Immunofixation Result, Serum Comment     Comment: An apparent normal immunofixation pattern.  Homocysteine     Status: None   Collection Time: 08/03/18  2:13 PM  Result Value Ref Range   Homocysteine 9.3 0.0 - 15.0 umol/L    Comment: (NOTE) Performed At: [facility] [address] [place], [place] [id number] Rush Farmer MD [id number]   TSH     Status: None   Collection Time: 08/03/18  2:14 PM  Result Value Ref Range   TSH 1.092 0.308 - 3.960 uIU/mL    Comment: Performed at [facility], [place] [address], [place],  [address]  CUP PACEART [facility] DEVICE CHECK     Status: None   Collection Time: 08/16/18  6:45 PM  Result Value Ref Range   Date Time Interrogation Session [id number]    Pulse Generator Manufacturer BOST    Pulse Gen Model E110 TELIGEN 100    Pulse Gen Serial Number [id number]    Clinic Name [facility]    Implantable Pulse Generator Type Implantable Cardiac Defibulator    Implantable Pulse Generator Implant Date [id number]    Implantable Lead Manufacturer [facility]    Implantable Lead Model 0157 Endotak Reliance    Implantable Lead Serial Number [id number]    Implantable Lead Implant Date 62376283    Implantable Lead Location Detail 1 APEX    Implantable Lead Location [id number]    Implantable Lead Manufacturer [facility]    Implantable Lead Model 5076 CapSureFix Novus    Implantable Lead Serial Number [id number]    Implantable Lead Implant Date 15176160    Implantable Lead Location Detail 1 APPENDAGE    Implantable Lead Location [id number]    Lead Channel Setting Sensing Sensitivity 0.6 mV   Lead Channel Setting Sensing Adaptation Mode Adaptive Sensing    Lead Channel Setting Pacing Amplitude 2.0 V   Lead Channel Setting Pacing Pulse Width 0.4 ms   Lead Channel Setting  Pacing Amplitude 2.4 V   Lead Channel Impedance Value 432.0 ohm   Lead Channel Sensing Intrinsic Amplitude 4.9 mV   Lead Channel Pacing Threshold Amplitude 0.5000 V   Lead Channel Pacing Threshold Pulse Width 0.4 ms   Lead Channel Impedance Value 456.0 ohm   Lead Channel Sensing Intrinsic Amplitude 19.8 mV   Lead Channel Pacing Threshold Amplitude 0.3000 V   Lead Channel Pacing Threshold Pulse Width 0.4 ms   HighPow Impedance 52.0 ohm   HighPow Impedance 38.0 ohm   Battery Status BOS    Eval  Rhythm SR 84     RADIOGRAPHIC STUDIES: I have personally reviewed the radiological images as listed and agreed with the findings in the report. Dg Chest Port 1 View  Result Date: 08/07/2018 CLINICAL DATA:  Decreased oxygen saturation after upper endoscopy today. EXAM: PORTABLE CHEST 1 VIEW COMPARISON:  PA and lateral chest 01/09/2018 and 11/23/2016. FINDINGS: The lungs are clear. There is cardiomegaly. No pneumothorax or pleural effusion. AICD is in place. No acute bony abnormality. IMPRESSION: No acute disease. Electronically Signed   By: Inge Rise M.D.   On: 08/07/2018 13:30    ASSESSMENT & PLAN:   Macrocytic anemia of uncertain etiology -she is quite hopeful that her CBC today will be normal.  I hope that she is correct.  However lab is closed already and we will draw her labs tomorrow.  I have told her that if her anemia has persisted that we need to bring her back after the holidays and do a bone marrow biopsy.  We are also checking an LDH level.  It is uncertain at this point exactly what the cause of her anemia is.  She did have normal B12, normal folate, normal ferritin, normal liver and kidney enzymes.  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  She does have an elevated MCV, but normal reticulocyte count.  She does understand the need for a bone marrow biopsy and consents.  We will contact her with results of her CBC.  She specifically denied shortness of breath.   All questions were answered. The patient knows to call the clinic with any problems, questions or concerns. I spent 30 minutes counseling the patient face to face. The total time spent in the appointment was 55 minutes and more than 50% was on counseling.     Bill Salinas, NP-C, AOCNP 08/28/18 4:43 PM

## 2018-08-28 NOTE — Telephone Encounter (Signed)
Printed avs and calender of upcoming appointment. Per 12/16 los 

## 2018-08-29 ENCOUNTER — Encounter: Payer: Self-pay | Admitting: *Deleted

## 2018-08-29 ENCOUNTER — Inpatient Hospital Stay: Payer: Medicare Other

## 2018-08-29 DIAGNOSIS — Z95810 Presence of automatic (implantable) cardiac defibrillator: Secondary | ICD-10-CM | POA: Diagnosis not present

## 2018-08-29 DIAGNOSIS — D649 Anemia, unspecified: Secondary | ICD-10-CM

## 2018-08-29 DIAGNOSIS — E785 Hyperlipidemia, unspecified: Secondary | ICD-10-CM | POA: Diagnosis not present

## 2018-08-29 DIAGNOSIS — Z79899 Other long term (current) drug therapy: Secondary | ICD-10-CM | POA: Diagnosis not present

## 2018-08-29 DIAGNOSIS — E039 Hypothyroidism, unspecified: Secondary | ICD-10-CM | POA: Diagnosis not present

## 2018-08-29 LAB — CMP (CANCER CENTER ONLY)
ALBUMIN: 3.6 g/dL (ref 3.5–5.0)
ALK PHOS: 78 U/L (ref 38–126)
ALT: 11 U/L (ref 0–44)
AST: 11 U/L — AB (ref 15–41)
Anion gap: 10 (ref 5–15)
BUN: 20 mg/dL (ref 8–23)
CO2: 33 mmol/L — AB (ref 22–32)
Calcium: 9.3 mg/dL (ref 8.9–10.3)
Chloride: 100 mmol/L (ref 98–111)
Creatinine: 0.86 mg/dL (ref 0.44–1.00)
GFR, Est AFR Am: >60 mL/min (ref >60)
GFR, Estimated: >60 mL/min (ref >60)
GLUCOSE: 90 mg/dL (ref 70–99)
POTASSIUM: 3.6 mmol/L (ref 3.5–5.1)
SODIUM: 143 mmol/L (ref 135–145)
Total Bilirubin: 1 mg/dL (ref 0.3–1.2)
Total Protein: 6.8 g/dL (ref 6.5–8.1)

## 2018-08-29 LAB — CBC WITH DIFFERENTIAL (CANCER CENTER ONLY)
ABS IMMATURE GRANULOCYTES: 0.01 10*3/uL (ref 0.00–0.07)
BASOS ABS: 0 10*3/uL (ref 0.0–0.1)
Basophils Relative: 1 %
Eosinophils Absolute: 0.1 10*3/uL (ref 0.0–0.5)
Eosinophils Relative: 2 %
HEMATOCRIT: 30 % — AB (ref 36.0–46.0)
HEMOGLOBIN: 9.5 g/dL — AB (ref 12.0–15.0)
IMMATURE GRANULOCYTES: 0 %
LYMPHS ABS: 2 10*3/uL (ref 0.7–4.0)
LYMPHS PCT: 48 %
MCH: 32.9 pg (ref 26.0–34.0)
MCHC: 31.7 g/dL (ref 30.0–36.0)
MCV: 103.8 fL — ABNORMAL HIGH (ref 80.0–100.0)
Monocytes Absolute: 0.3 10*3/uL (ref 0.1–1.0)
Monocytes Relative: 6 %
NEUTROS PCT: 43 %
Neutro Abs: 1.7 10*3/uL (ref 1.7–7.7)
Platelet Count: 234 10*3/uL (ref 150–400)
RBC: 2.89 MIL/uL — ABNORMAL LOW (ref 3.87–5.11)
RDW: 22.7 % — ABNORMAL HIGH (ref 11.5–15.5)
WBC Count: 4.1 10*3/uL (ref 4.0–10.5)
nRBC: 0.5 % — ABNORMAL HIGH (ref 0.0–0.2)

## 2018-08-29 LAB — C-REACTIVE PROTEIN: CRP: <0.8 mg/dL (ref <1.0)

## 2018-08-29 LAB — LACTATE DEHYDROGENASE: LDH: 149 U/L (ref 98–192)

## 2018-09-03 ENCOUNTER — Other Ambulatory Visit: Payer: Self-pay | Admitting: Internal Medicine

## 2018-09-05 ENCOUNTER — Telehealth: Payer: Self-pay | Admitting: Internal Medicine

## 2018-09-05 NOTE — Telephone Encounter (Signed)
RR out - moved 12/30 lab/fu with [facility] to 1/9 with [facility]. Left message for patient. Schedule mailed.

## 2018-09-11 ENCOUNTER — Inpatient Hospital Stay: Payer: Medicare Other | Admitting: Nurse Practitioner

## 2018-09-11 ENCOUNTER — Inpatient Hospital Stay: Payer: Medicare Other

## 2018-09-20 ENCOUNTER — Other Ambulatory Visit: Payer: Self-pay | Admitting: *Deleted

## 2018-09-20 DIAGNOSIS — D649 Anemia, unspecified: Secondary | ICD-10-CM

## 2018-09-21 ENCOUNTER — Encounter: Payer: Self-pay | Admitting: Internal Medicine

## 2018-09-21 ENCOUNTER — Inpatient Hospital Stay (HOSPITAL_BASED_OUTPATIENT_CLINIC_OR_DEPARTMENT_OTHER): Payer: Medicare Other | Admitting: Internal Medicine

## 2018-09-21 ENCOUNTER — Inpatient Hospital Stay: Payer: Medicare Other | Attending: Hematology and Oncology

## 2018-09-21 ENCOUNTER — Telehealth: Payer: Self-pay

## 2018-09-21 ENCOUNTER — Other Ambulatory Visit: Payer: Self-pay

## 2018-09-21 VITALS — BP 105/49 | HR 80 | Temp 98.7°F | Resp 18 | Ht 59.0 in | Wt 193.4 lb

## 2018-09-21 DIAGNOSIS — E039 Hypothyroidism, unspecified: Secondary | ICD-10-CM

## 2018-09-21 DIAGNOSIS — I10 Essential (primary) hypertension: Secondary | ICD-10-CM | POA: Insufficient documentation

## 2018-09-21 DIAGNOSIS — D520 Dietary folate deficiency anemia: Secondary | ICD-10-CM

## 2018-09-21 DIAGNOSIS — E611 Iron deficiency: Secondary | ICD-10-CM | POA: Insufficient documentation

## 2018-09-21 DIAGNOSIS — D649 Anemia, unspecified: Secondary | ICD-10-CM | POA: Insufficient documentation

## 2018-09-21 DIAGNOSIS — D539 Nutritional anemia, unspecified: Secondary | ICD-10-CM

## 2018-09-21 DIAGNOSIS — Z79899 Other long term (current) drug therapy: Secondary | ICD-10-CM | POA: Diagnosis not present

## 2018-09-21 DIAGNOSIS — K219 Gastro-esophageal reflux disease without esophagitis: Secondary | ICD-10-CM | POA: Diagnosis not present

## 2018-09-21 HISTORY — DX: Iron deficiency: E61.1

## 2018-09-21 LAB — CBC WITH DIFFERENTIAL (CANCER CENTER ONLY)
ABS IMMATURE GRANULOCYTES: 0.02 10*3/uL (ref 0.00–0.07)
Basophils Absolute: 0 10*3/uL (ref 0.0–0.1)
Basophils Relative: 0 %
Eosinophils Absolute: 0 10*3/uL (ref 0.0–0.5)
Eosinophils Relative: 1 %
HCT: 30.8 % — ABNORMAL LOW (ref 36.0–46.0)
HEMOGLOBIN: 9.9 g/dL — AB (ref 12.0–15.0)
IMMATURE GRANULOCYTES: 1 %
Lymphocytes Relative: 35 %
Lymphs Abs: 1.5 10*3/uL (ref 0.7–4.0)
MCH: 33.1 pg (ref 26.0–34.0)
MCHC: 32.1 g/dL (ref 30.0–36.0)
MCV: 103 fL — ABNORMAL HIGH (ref 80.0–100.0)
MONO ABS: 0.2 10*3/uL (ref 0.1–1.0)
MONOS PCT: 5 %
NEUTROS ABS: 2.5 10*3/uL (ref 1.7–7.7)
Neutrophils Relative %: 58 %
Platelet Count: 290 10*3/uL (ref 150–400)
RBC: 2.99 MIL/uL — ABNORMAL LOW (ref 3.87–5.11)
RDW: 22.9 % — ABNORMAL HIGH (ref 11.5–15.5)
WBC Count: 4.2 10*3/uL (ref 4.0–10.5)
nRBC: 0.7 % — ABNORMAL HIGH (ref 0.0–0.2)

## 2018-09-21 LAB — CMP (CANCER CENTER ONLY)
ALK PHOS: 73 U/L (ref 38–126)
ALT: 10 U/L (ref 0–44)
AST: 10 U/L — AB (ref 15–41)
Albumin: 3.6 g/dL (ref 3.5–5.0)
Anion gap: 8 (ref 5–15)
BILIRUBIN TOTAL: 1.1 mg/dL (ref 0.3–1.2)
BUN: 21 mg/dL (ref 8–23)
CO2: 33 mmol/L — ABNORMAL HIGH (ref 22–32)
Calcium: 9.2 mg/dL (ref 8.9–10.3)
Chloride: 101 mmol/L (ref 98–111)
Creatinine: 0.84 mg/dL (ref 0.44–1.00)
GFR, Est AFR Am: >60 mL/min (ref >60)
GFR, Estimated: >60 mL/min (ref >60)
GLUCOSE: 111 mg/dL — AB (ref 70–99)
Potassium: 3.5 mmol/L (ref 3.5–5.1)
Sodium: 142 mmol/L (ref 135–145)
TOTAL PROTEIN: 6.9 g/dL (ref 6.5–8.1)

## 2018-09-21 NOTE — Telephone Encounter (Signed)
Printed avs and calender of upcoming appointment. Per 1/9 los 

## 2018-09-21 NOTE — Progress Notes (Signed)
Diagnosis Anemia, macrocytic, nutritional - Plan: CBC with Differential/Platelet, Comprehensive metabolic panel, Lactate dehydrogenase, Ferritin, Haptoglobin  Iron deficiency  Staging Cancer Staging No matching staging information was found for the patient.  Assessment and Plan:  1.  Macrocytic anemia.  73 year old previously followed by Dr. Audelia Hives.  Pt has past medical history is significant for dyslipidemia; compensated systolic congestive heart failure; placement of a defibrillator in 2011; nonischemic cardiomyopathy; bilateral nonsurgical cataracts; hypothyroid disease; mitral regurgitation; elevated BMI; diverticular disease; gastroesophageal reflux disease with dysphagia; and a serrated sessile polyp (2) identified initially 3 years earlier. Her primary care physician is Dr. Tedra Senegal.  Her gastroenterologist is Dr. Zenovia Jarred.  Over the past 6 months, she was aware that she was anemic.   A review of her laboratory studies from May 01, 2015 show hemoglobin 11.6 hematocrit 33.3 WBC 3.8; platelets 307,000.  On December 05, 2015: A complete blood count showed hemoglobin 11.1 hematocrit 33.4 WBC 5.6; platelets 339,000.  On May 03, 2017: A complete blood count showed hemoglobin 10.9 hematocrit 33.9 MCV 103.7 MCH 33.3 RDW 17.4 WBC 5.0 with 47% neutrophils 43% lymphocytes 7% monocytes 2% eosinophils 1% basophil; platelets 349,000.  More recently, on May 12, 2018: A complete blood count showed hemoglobin 9.7 hematocrit 29.0 MCV 101.4 MCH 33.9 RDW 17.3 WBC 4.2 with 47% neutrophils 43% lymphocytes 7% monocytes 2% eosinophils 1% basophil; platelets 333,000.  On May 25, 2018: A complete blood count showed hemoglobin 9.6 hematocrit 28.8 MCV 101.4 MCH 33.8 RDW 17.9 WBC 3.8 with 39% neutrophils 51% lymphocytes 7% monocytes 2% eosinophils 1% basophil; platelets 338,000.  She has had in the past 2 colonoscopies.  In 2015 a Cologuard was positive.  A serrated sessile polyp was removed from the  ascending colon at that time.  A follow-up colonoscopy one year later on February 25, 2015 revealed a second 5 mm sessile serrated polyp in the descending colon. The prior polypectomy site in the ascending colon showed no residual polyp.  She denies any bright red blood per rectum or melena.  She has no significant change in bowel habits although her stools vary from constipation to loose to normal.  She denies abdominal pain or tenesmus.  She has had dyspepsia and dysphagia in spite of omeprazole. She denies odynophagia. Solid food seems to get stuck in her epigastrium.   Labs done 09/21/2018 reviewed and showed WBC 4.2 HB 9.9 plts 290,000.  Chemistries WNL with K+ 3.5 Cr 0.84 and normal LFTs.  Labs done 08/03/2018 showed low normal ferritin of 47.    Pt has an intolerance to oral iron and will be given a trial of IV iron due to low normal ferritin and persistent anemia with HB of 9.9.  Pt will be treated with Feraheme 510 mg IV D1 and D8.  If counts fail to respond, she will be set up for bone marrow biopsy for further evaluation due to persistent macrocytosis to rule out MDS. Side effects of IV iron reviewed.  She will have repeat labs 11/09/2018 and will follow-up to go over results.    2.  Hypothyroidism.  Recent TSH WNL.  Follow-up with PCP for monitoring.   3.  HTN.  BP is 105/49.  Follow-up with PCP.   4.  Health maintenance.  Pt had colonoscopy in 2016 that showed a polyp but no evidence of dysplasia.  EGD done 08/07/2018 showed single gastric polyp and evidence of gastritis.  Follow-up with GI as recommended.  Mammograms as recommended.    25 minutes  spent with more than 50% spent in review of records, counseling and coordination of care.    Interval History:  Historical data obtained from note dated 08/03/2018.  73 year old female previously followed by Dr. Audelia Hives.  Pt has history of serrated sessile polyp (2) identified initially 3 years earlier. Her primary care physician is Dr. Tedra Senegal.  Her  gastroenterologist is Dr. Zenovia Jarred.  Over the past 6 months, she was aware that she was anemic.   Laboratory studies from May 01, 2015 show hemoglobin 11.6 hematocrit 33.3 WBC 3.8; platelets 307,000.  On December 05, 2015: A complete blood count showed hemoglobin 11.1 hematocrit 33.4 WBC 5.6; platelets 339,000.  On May 03, 2017: A complete blood count showed hemoglobin 10.9 hematocrit 33.9 MCV 103.7 MCH 33.3 RDW 17.4 WBC 5.0 with 47% neutrophils 43% lymphocytes 7% monocytes 2% eosinophils 1% basophil; platelets 349,000.  More recently, on May 12, 2018: A complete blood count showed hemoglobin 9.7 hematocrit 29.0 MCV 101.4 MCH 33.9 RDW 17.3 WBC 4.2 with 47% neutrophils 43% lymphocytes 7% monocytes 2% eosinophils 1% basophil; platelets 333,000.  On May 25, 2018: A complete blood count showed hemoglobin 9.6 hematocrit 28.8 MCV 101.4 MCH 33.8 RDW 17.9 WBC 3.8 with 39% neutrophils 51% lymphocytes 7% monocytes 2% eosinophils 1% basophil; platelets 338,000.  She has had 2 colonoscopies.  In 2015 a Cologuard was positive.  A serrated sessile polyp was removed from the ascending colon at that time.  A follow-up colonoscopy one year later on February 25, 2015 revealed a second 5 mm sessile serrated polyp in the descending colon. The prior polypectomy site in the ascending colon showed no residual polyp.  She denies any bright red blood per rectum or melena.  She has no significant change in bowel habits although her stools vary from constipation to loose to normal.  She denies abdominal pain or tenesmus.  She has had dyspepsia and dysphagia in spite of omeprazole. She denies odynophagia. Solid food seems to get stuck in her epigastrium.    Current Status:  Pt is seen today for follow-up to go over labs.    Problem List Patient Active Problem List   Diagnosis Date Noted  . Iron deficiency [E61.1] 09/21/2018  . Anemia [D64.9] 08/03/2018  . Impaired glucose tolerance [R73.02] 06/21/2017  . Cough variant  asthma  vs UACS from ACEi  [J45.991] 12/31/2016  . Essential hypertension [I10] 12/31/2016  . Morbid obesity due to excess calories ([place]) [E66.01] 12/31/2016  . History of colonic polyps [Z86.010]   . Benign neoplasm of descending colon [D12.4]   . Diverticulosis of colon without hemorrhage [K57.30] 01/28/2014  . 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 Nonspecific abnormal finding in stool contents [R19.5] 11/29/2013  . Colon cancer screening [Z12.11] 11/29/2013  . History of depression [Z86.59] 08/11/2012  . Chronic systolic CHF (congestive heart failure) ([place]) [I50.22]   . Systolic CHF, chronic ([place]) [I50.22]   . LBBB (left bundle branch block) [I44.7]   . Automatic implantable cardioverter-defibrillator in situ [Z95.810]   . Hypothyroidism [E03.9]   . GERD (gastroesophageal reflux disease) [K21.9]   . Dyslipidemia [E78.5]   . Mitral regurgitation [I34.0]   . Overweight [E66.3]   . Ejection fraction < 50% [R94.30]     Past Medical History Past Medical History:  Diagnosis Date  . Automatic implantable cardioverter-defibrillator in situ    Dr. Beckie Salts follows-next visit- [phone]  . Cataract   . Diverticulosis   . Dyslipidemia   . Ejection fraction < 50%  EF 25-30%, September, 2010  . GERD (gastroesophageal reflux disease)   . Hypothyroidism   . ICD (implantable cardiac defibrillator) in place    10/2009; Dr. Lovena Le  . Iron deficiency 09/21/2018  . LBBB (left bundle branch block)    old  . Macrocytic anemia   . Mitral regurgitation    Mild, echo, September, 2010  . Nonischemic cardiomyopathy ([place])    Normal coronary arteries, catheterization, September, 2009  . Overweight(278.02)   . Serrated polyp of colon   . Systolic CHF, chronic ([facility])    no recent problems    Past Surgical History Past Surgical History:  Procedure Laterality Date  . BIOPSY  08/07/2018   Procedure: BIOPSY;  Surgeon: Jerene Bears, MD;  Location: [facility];  Service: Gastroenterology;;   . CARDIAC CATHETERIZATION     '09 last  . [place]    . COLONOSCOPY N/A 11/29/2013   Procedure: COLONOSCOPY;  Surgeon: Jerene Bears, MD;  Location: [facility];  Service: Gastroenterology;  Laterality: N/A;  . COLONOSCOPY N/A 02/25/2015   Procedure: COLONOSCOPY;  Surgeon: Jerene Bears, MD;  Location: [facility];  Service: Gastroenterology;  Laterality: N/A;  . ESOPHAGOGASTRODUODENOSCOPY (EGD) WITH PROPOFOL N/A 08/07/2018   Procedure: ESOPHAGOGASTRODUODENOSCOPY (EGD) WITH PROPOFOL;  Surgeon: Jerene Bears, MD;  Location: [facility];  Service: Gastroenterology;  Laterality: N/A;  . EYE SURGERY Left    x 3-"droopy eyelid"  . heart catherization    . TONSILLECTOMY  1952    Family History Family History  Problem Relation Age of Onset  . Heart attack Mother   . Heart disease Mother   . Hypertension Mother   . Lung cancer Father   . Pancreatic cancer Father   . Cancer Father   . Stroke Paternal Grandfather   . Colon cancer Neg Hx   . Diabetes Neg Hx   . Kidney disease Neg Hx   . Liver disease Neg Hx      Social History  reports that she has never smoked. She has never used smokeless tobacco. She reports current alcohol use. She reports that she does not use drugs.  Medications  Current Outpatient Medications:  .  albuterol (PROVENTIL HFA;VENTOLIN HFA) 108 (90 Base) MCG/ACT inhaler, Inhale 2 puffs into the lungs every 6 (six) hours as needed for wheezing or shortness of breath., Disp: 1 Inhaler, Rfl: prn .  carvedilol (COREG) 25 MG tablet, TAKE 1 TABLET BY MOUTH TWICE A DAY, Disp: 180 tablet, Rfl: 2 .  folic acid (FOLVITE) 1 MG tablet, Take 1 tablet (1 mg total) by mouth daily., Disp: 90 tablet, Rfl: 3 .  furosemide (LASIX) 40 MG tablet, TAKE 1 TABLET BY MOUTH EVERY DAY, Disp: 90 tablet, Rfl: 2 .  glucose blood (ACCU-CHEK AVIVA) test strip, Check daily.  For Diabetes 250.00, Disp: 100 each, Rfl: 12 .  ipratropium-albuterol (DUONEB) 0.5-2.5 (3) MG/3ML  SOLN, Take 3 mLs by nebulization every 4 (four) hours as needed (shortness of breath or wheezing)., Disp: , Rfl:  .  Lancets MISC, Check glucose daily.  Diabetes 250.00, Disp: 100 each, Rfl: 11 .  levothyroxine (SYNTHROID, LEVOTHROID) 150 MCG tablet, TAKE 1 TABLET BY MOUTH EVERY DAY, Disp: 90 tablet, Rfl: 0 .  losartan (COZAAR) 25 MG tablet, TAKE 1 TABLET BY MOUTH EVERY DAY, Disp: 90 tablet, Rfl: 2 .  omeprazole (PRILOSEC) 20 MG capsule, Take 1 capsule (20 mg total) by mouth 2 (two) times daily before a meal., Disp: 180 capsule, Rfl: 1 .  Propylene  Glycol (SYSTANE BALANCE) 0.6 % SOLN, Place 1 drop into both eyes daily as needed (for dry eyes)., Disp: , Rfl:   Allergies Patient has no known allergies.  Review of Systems Review of Systems - Oncology ROS negative   Physical Exam  Vitals Wt Readings from Last 3 Encounters:  09/21/18 193 lb 6.4 oz (87.7 kg)  08/28/18 197 lb 1.6 oz (89.4 kg)  08/16/18 197 lb (89.4 kg)   Temp Readings from Last 3 Encounters:  09/21/18 98.7 F (37.1 C) (Oral)  08/28/18 97.7 F (36.5 C) (Oral)  08/07/18 98 F (36.7 C) (Oral)   BP Readings from Last 3 Encounters:  09/21/18 (!) 105/49  08/28/18 (!) 114/48  08/16/18 124/64   Pulse Readings from Last 3 Encounters:  09/21/18 80  08/28/18 81  08/16/18 74   Constitutional: Well-developed, well-nourished, and in no distress.   HENT: Head: Normocephalic and atraumatic.  Mouth/Throat: No oropharyngeal exudate. Mucosa moist. Eyes: Pupils are equal, round, and reactive to light. Conjunctivae are normal. No scleral icterus.  Neck: Normal range of motion. Neck supple. No JVD present.  Cardiovascular: Normal rate, regular rhythm and normal heart sounds.  Exam reveals no gallop and no friction rub.   No murmur heard. Pulmonary/Chest: Effort normal and breath sounds normal. No respiratory distress. No wheezes.No rales.  Abdominal: Soft. Bowel sounds are normal. No distension. There is no tenderness. There is  no guarding.  Musculoskeletal: No edema or tenderness.  Lymphadenopathy: No cervical, axillary or supraclavicular adenopathy.  Neurological: Alert and oriented to person, place, and time. No cranial nerve deficit.  Skin: Skin is warm and dry. No rash noted. No erythema. No pallor.  Psychiatric: Affect and judgment normal.   Labs Appointment on 09/21/2018  Component Date Value Ref Range Status  . Sodium 09/21/2018 142  135 - 145 mmol/L Final  . Potassium 09/21/2018 3.5  3.5 - 5.1 mmol/L Final  . Chloride 09/21/2018 101  98 - 111 mmol/L Final  . CO2 09/21/2018 33* 22 - 32 mmol/L Final  . Glucose, Bld 09/21/2018 111* 70 - 99 mg/dL Final  . BUN 09/21/2018 21  8 - 23 mg/dL Final  . Creatinine 09/21/2018 0.84  0.44 - 1.00 mg/dL Final  . Calcium 09/21/2018 9.2  8.9 - 10.3 mg/dL Final  . Total Protein 09/21/2018 6.9  6.5 - 8.1 g/dL Final  . Albumin 09/21/2018 3.6  3.5 - 5.0 g/dL Final  . AST 09/21/2018 10* 15 - 41 U/L Final  . ALT 09/21/2018 10  0 - 44 U/L Final  . Alkaline Phosphatase 09/21/2018 73  38 - 126 U/L Final  . Total Bilirubin 09/21/2018 1.1  0.3 - 1.2 mg/dL Final  . GFR, Est Non Af Am 09/21/2018 >60  >60 mL/min Final  . GFR, Est AFR Am 09/21/2018 >60  >60 mL/min Final  . Anion gap 09/21/2018 8  5 - 15 Final   Performed at [facility], [place] [address], [place], [place] [address]  . WBC Count 09/21/2018 4.2  4.0 - 10.5 K/uL Final  . RBC 09/21/2018 2.99* 3.87 - 5.11 MIL/uL Final  . Hemoglobin 09/21/2018 9.9* 12.0 - 15.0 g/dL Final  . HCT 09/21/2018 30.8* 36.0 - 46.0 % Final  . MCV 09/21/2018 103.0* 80.0 - 100.0 fL Final  . MCH 09/21/2018 33.1  26.0 - 34.0 pg Final  . MCHC 09/21/2018 32.1  30.0 - 36.0 g/dL Final  . RDW 09/21/2018 22.9* 11.5 - 15.5 % Final  . Platelet Count 09/21/2018  290  150 - 400 K/uL Final  . nRBC 09/21/2018 0.7* 0.0 - 0.2 % Final  . Neutrophils Relative % 09/21/2018 58  % Final  . Neutro Abs 09/21/2018 2.5  1.7 - 7.7 K/uL Final  .  Lymphocytes Relative 09/21/2018 35  % Final  . Lymphs Abs 09/21/2018 1.5  0.7 - 4.0 K/uL Final  . Monocytes Relative 09/21/2018 5  % Final  . Monocytes Absolute 09/21/2018 0.2  0.1 - 1.0 K/uL Final  . Eosinophils Relative 09/21/2018 1  % Final  . Eosinophils Absolute 09/21/2018 0.0  0.0 - 0.5 K/uL Final  . Basophils Relative 09/21/2018 0  % Final  . Basophils Absolute 09/21/2018 0.0  0.0 - 0.1 K/uL Final  . WBC Morphology 09/21/2018 OC VARIANT LYMP   Final  . RBC Morphology 09/21/2018 FEW RBC FRAGMENTS   Final  . Immature Granulocytes 09/21/2018 1  % Final  . Abs Immature Granulocytes 09/21/2018 0.02  0.00 - 0.07 K/uL Final  . Ovalocytes 09/21/2018 PRESENT   Final   Performed at [facility], [place] [address], [place], [place] [address]     Pathology Orders Placed This Encounter  Procedures  . CBC with Differential/Platelet    Standing Status:   Future    Standing Expiration Date:   09/22/2019  . Comprehensive metabolic panel    Standing Status:   Future    Standing Expiration Date:   09/22/2019  . Lactate dehydrogenase    Standing Status:   Future    Standing Expiration Date:   09/22/2019  . Ferritin    Standing Status:   Future    Standing Expiration Date:   09/22/2019  . Haptoglobin    Standing Status:   Future    Standing Expiration Date:   09/22/2019       Zoila Shutter MD

## 2018-09-22 ENCOUNTER — Ambulatory Visit (HOSPITAL_COMMUNITY)
Admission: RE | Admit: 2018-09-22 | Discharge: 2018-09-22 | Disposition: A | Discharge status: Home / Self Care | Payer: Medicare Other | Source: Ambulatory Visit | Attending: Internal Medicine | Admitting: Internal Medicine

## 2018-09-22 DIAGNOSIS — D520 Dietary folate deficiency anemia: Secondary | ICD-10-CM | POA: Diagnosis not present

## 2018-09-22 MED ORDER — SODIUM CHLORIDE 0.9 % IV SOLN
510.0000 mg | Freq: Once | INTRAVENOUS | Status: AC
  Administered 2018-09-22: 510 mg via INTRAVENOUS
  Filled 2018-09-22: qty 17

## 2018-09-22 MED ORDER — SODIUM CHLORIDE 0.9 % IV SOLN
Freq: Once | INTRAVENOUS | Status: AC
  Administered 2018-09-22: 14:00:00 via INTRAVENOUS

## 2018-09-22 NOTE — Progress Notes (Signed)
PATIENT CARE CENTER NOTE  Diagnosis: Macrocytic Anemia     Provider: Dr. Mathis Dad Higgs   Procedure: IV Feraheme   Note: Patient received Feraheme infusion. Observed patient for 30 minutes post-infusion. Tolerated well. Vital signs stable. Discharge instructions given. Patient to come back next week for second dose of Feraheme. Alert, oriented and ambulatory at discharge.

## 2018-09-22 NOTE — Discharge Instructions (Signed)

## 2018-09-29 ENCOUNTER — Ambulatory Visit (HOSPITAL_COMMUNITY)
Admission: RE | Admit: 2018-09-29 | Discharge: 2018-09-29 | Disposition: A | Discharge status: Home / Self Care | Payer: Medicare Other | Source: Ambulatory Visit | Attending: Internal Medicine | Admitting: Internal Medicine

## 2018-09-29 DIAGNOSIS — D520 Dietary folate deficiency anemia: Secondary | ICD-10-CM | POA: Diagnosis not present

## 2018-09-29 MED ORDER — SODIUM CHLORIDE 0.9 % IV SOLN
INTRAVENOUS | Status: DC | PRN
  Administered 2018-09-29: 250 mL via INTRAVENOUS

## 2018-09-29 MED ORDER — SODIUM CHLORIDE 0.9 % IV SOLN
510.0000 mg | Freq: Once | INTRAVENOUS | Status: AC
  Administered 2018-09-29: 510 mg via INTRAVENOUS
  Filled 2018-09-29: qty 17

## 2018-09-29 NOTE — Progress Notes (Signed)
PATIENT CARE CENTER NOTE  Diagnosis: Macrocytic Anemia     Provider: Dr. Mathis Dad Higgs   Procedure: IV Feraheme   Note: Patient received Feraheme infusion. Observed patient for 30 minutes post-infusion. Tolerated well. Vital signs stable. Discharge instructions given. Alert, oriented and ambulatory at discharge.

## 2018-09-29 NOTE — Discharge Instructions (Signed)

## 2018-10-05 LAB — CUP PACEART REMOTE DEVICE CHECK
Battery Remaining Longevity: 42 mo
Battery Remaining Percentage: 50 %
Brady Statistic RA Percent Paced: 0 %
Brady Statistic RV Percent Paced: 0 %
Date Time Interrogation Session: 20191209065100
HighPow Impedance: 47 Ohm
Implantable Lead Implant Date: 20110221
Implantable Lead Implant Date: 20110221
Implantable Lead Location: 753859
Implantable Lead Location: 753860
Implantable Lead Model: 5076
Implantable Pulse Generator Implant Date: 20110221
Lead Channel Impedance Value: 419 Ohm
Lead Channel Impedance Value: 425 Ohm
Lead Channel Pacing Threshold Amplitude: 0.1 V
Lead Channel Pacing Threshold Amplitude: 0.5 V
Lead Channel Pacing Threshold Pulse Width: 0.4 ms
Lead Channel Pacing Threshold Pulse Width: 0.4 ms
Lead Channel Setting Pacing Amplitude: 2 V
Lead Channel Setting Pacing Amplitude: 2.4 V
Lead Channel Setting Pacing Pulse Width: 0.4 ms
Lead Channel Setting Sensing Sensitivity: 0.6 mV
MDC IDC LEAD SERIAL: 301957
Pulse Gen Serial Number: 160860

## 2018-10-09 ENCOUNTER — Telehealth: Payer: Self-pay | Admitting: Internal Medicine

## 2018-10-09 MED ORDER — PANTOPRAZOLE SODIUM 40 MG PO TBEC: 40.0000 mg | DELAYED_RELEASE_TABLET | Freq: Every day | ORAL | 3 refills | Status: DC

## 2018-10-09 NOTE — Telephone Encounter (Signed)
Spoke with pt and she is aware, script sent to pharmacy. 

## 2018-10-09 NOTE — Telephone Encounter (Signed)
Would change to pantoprazole, but would start at 40 mg once daily If gerd/indigestion symptoms are not controlled at this dose she should let me know EGD done recently, which I reviewed

## 2018-10-09 NOTE — Telephone Encounter (Signed)
PT would like to know if spotting on her hands could be a allergy that can develop because of the increase on the med omeprazole (PRILOSEC).

## 2018-10-09 NOTE — Telephone Encounter (Signed)
Pt states that when Dr. Hilarie Fredrickson increased her omeprazole to twice a day dosing her hands developed red splotches and itching. She stopped taking the omeprazole on Saturday and states that the spots on her hands are better and itching is better. Pt states she needs something else to take for gerd. Please advise.

## 2018-10-10 DIAGNOSIS — H25012 Cortical age-related cataract, left eye: Secondary | ICD-10-CM | POA: Diagnosis not present

## 2018-10-10 DIAGNOSIS — H40013 Open angle with borderline findings, low risk, bilateral: Secondary | ICD-10-CM | POA: Diagnosis not present

## 2018-10-10 DIAGNOSIS — H2512 Age-related nuclear cataract, left eye: Secondary | ICD-10-CM | POA: Diagnosis not present

## 2018-10-10 DIAGNOSIS — H2513 Age-related nuclear cataract, bilateral: Secondary | ICD-10-CM | POA: Diagnosis not present

## 2018-10-10 DIAGNOSIS — H25013 Cortical age-related cataract, bilateral: Secondary | ICD-10-CM | POA: Diagnosis not present

## 2018-10-10 DIAGNOSIS — E119 Type 2 diabetes mellitus without complications: Secondary | ICD-10-CM | POA: Diagnosis not present

## 2018-10-23 ENCOUNTER — Ambulatory Visit (INDEPENDENT_AMBULATORY_CARE_PROVIDER_SITE_OTHER): Payer: Medicare Other | Admitting: Cardiovascular Disease

## 2018-10-23 ENCOUNTER — Encounter: Payer: Self-pay | Admitting: Cardiovascular Disease

## 2018-10-23 VITALS — BP 102/50 | HR 75 | Ht 59.0 in | Wt 193.8 lb

## 2018-10-23 DIAGNOSIS — I447 Left bundle-branch block, unspecified: Secondary | ICD-10-CM | POA: Diagnosis not present

## 2018-10-23 DIAGNOSIS — I5022 Chronic systolic (congestive) heart failure: Secondary | ICD-10-CM | POA: Diagnosis not present

## 2018-10-23 NOTE — Patient Instructions (Signed)

## 2018-10-23 NOTE — Progress Notes (Signed)
Cardiology Office Note   Date:  10/23/2018   ID:  Gabrielle, [name] 11-Jan-1946, MRN [id number]  PCP:  Gabrielle Showers, MD  Cardiologist: previous [name] patient , now  Gabrielle Moores, MD   Chief Complaint  Patient presents with  . Congestive Heart Failure   Problem List 1. Chronic systolic congestive heart failure 2. ICD 3. Hypothyroidism     [name] is a 73 y.o. female who presents today to follow up cardiomyopathy. She is doing very well. She is not having any chest pain or shortness of breath. She's had no syncope or presyncope. Her ICD is followed carefully by Dr. Lovena [name]. I saw her last October, 2015. She had an echo shortly after that. Her EF was in the 30-35% range. This is slightly better than it had been. We have her on the maximum doses of medicines that she can tolerate.  December 09, 2015:  Doing well.    Former patient of Dr. Ron [name].  No CP ,  Chronic DOE with activity .  No dyspnea with sitting or lying down .   Jan. 8, 2018:  Doing well.  Mild dyspnea when she exerts herself , no CP   Aug. 22, 2018: She has a history of  chronic systolic congestive heart failure. Her last echocardiogram was in 2015 which shows an ejection fraction of 30-35%. She has a defibrillator. Has some dyspnea when walking  Is on Coreg 25 BID, Losartan 25 mg a day , Lasix  She was on Spironolactone but this was stopped when she developed some renal insufficiency while on valsartan plus Spironolactone  Has started doing water aeorbics  Jan 11, 2018:  Previous [name] patient  Hx of CHF ,   Has an ICD  BP is mildly elevated here.   Has been diagnosed with pneumonia by Dr. Renold [name]  - has been started on Abx.  Has prednisone, doxycycline, nebs   Nov. 11, 2019: [name] is a 73 year old female with a history of chronic systolic congestive heart failure.  Recent echocardiogram shows moderate to severe left ventricular dysfunction with an ejection fraction of 25 to 30%.  She has grade 2  diastolic dysfunction.  No syncope,  Has some DOE, No CP  Has some leg fatigue   October 23, 2018:  [name] is seen today for follow up  She and Dr. Lovena [name] have decided to wait and upgrade her ICD at a later time  She is breathing better .   She is bening more careful with her salt intake   Has also had anemia  She has not had a sleep study .     Past Medical History:  Diagnosis Date  . Automatic implantable cardioverter-defibrillator in situ    Dr. Beckie [name] follows-next visit- [phone]  . Cataract   . Diverticulosis   . Dyslipidemia   . Ejection fraction < 50%    EF 25-30%, September, 2010  . GERD (gastroesophageal reflux disease)   . Hypothyroidism   . ICD (implantable cardiac defibrillator) in place    10/2009; Dr. Lovena [name]  . Iron deficiency 09/21/2018  . LBBB (left bundle branch block)    old  . Macrocytic anemia   . Mitral regurgitation    Mild, echo, September, 2010  . Nonischemic cardiomyopathy ([place])    Normal coronary arteries, catheterization, September, 2009  . Overweight(278.02)   . Serrated polyp of colon   . Systolic CHF, chronic (HCC)    no recent problems    Past  Surgical History:  Procedure Laterality Date  . BIOPSY  08/07/2018   Procedure: BIOPSY;  Surgeon: Jerene Bears, MD;  Location: [facility];  Service: Gastroenterology;;  . CARDIAC CATHETERIZATION     '09 last  . [place]    . COLONOSCOPY N/A 11/29/2013   Procedure: COLONOSCOPY;  Surgeon: Jerene Bears, MD;  Location: [facility];  Service: Gastroenterology;  Laterality: N/A;  . COLONOSCOPY N/A 02/25/2015   Procedure: COLONOSCOPY;  Surgeon: Jerene Bears, MD;  Location: [facility];  Service: Gastroenterology;  Laterality: N/A;  . ESOPHAGOGASTRODUODENOSCOPY (EGD) WITH PROPOFOL N/A 08/07/2018   Procedure: ESOPHAGOGASTRODUODENOSCOPY (EGD) WITH PROPOFOL;  Surgeon: Jerene Bears, MD;  Location: [facility];  Service: Gastroenterology;  Laterality: N/A;  . EYE SURGERY Left      x 3-"droopy eyelid"  . heart catherization    . TONSILLECTOMY  1952    Patient Active Problem List   Diagnosis Date Noted  . Chronic systolic CHF (congestive heart failure) ([place])     Priority: High  . Iron deficiency 09/21/2018  . Anemia 08/03/2018  . Impaired glucose tolerance 06/21/2017  . Cough variant asthma  vs UACS from ACEi  12/31/2016  . Essential hypertension 12/31/2016  . Morbid obesity due to excess calories ([place]) 12/31/2016  . History of colonic polyps   . Benign neoplasm of descending colon   . Diverticulosis of colon without hemorrhage 01/28/2014  . Serrated adenoma of colon 01/28/2014  . Nonspecific abnormal finding in stool contents 11/29/2013  . Colon cancer screening 11/29/2013  . History of depression 08/11/2012  . Systolic CHF, chronic ([place])   . LBBB (left bundle branch block)   . Automatic implantable cardioverter-defibrillator in situ   . Hypothyroidism   . GERD (gastroesophageal reflux disease)   . Dyslipidemia   . Mitral regurgitation   . Overweight   . Ejection fraction < 50%       Current Outpatient Medications  Medication Sig Dispense Refill  . albuterol (PROVENTIL HFA;VENTOLIN HFA) 108 (90 Base) MCG/ACT inhaler Inhale 2 puffs into the lungs every 6 (six) hours as needed for wheezing or shortness of breath. 1 Inhaler prn  . carvedilol (COREG) 25 MG tablet TAKE 1 TABLET BY MOUTH TWICE A DAY 782 tablet 2  . folic acid (FOLVITE) 1 MG tablet Take 1 tablet (1 mg total) by mouth daily. 90 tablet 3  . furosemide (LASIX) 40 MG tablet TAKE 1 TABLET BY MOUTH EVERY DAY 90 tablet 2  . glucose blood (ACCU-CHEK AVIVA) test strip Check daily.  For Diabetes 250.00 100 each 12  . ipratropium-albuterol (DUONEB) 0.5-2.5 (3) MG/3ML SOLN Take 3 mLs by nebulization every 4 (four) hours as needed (shortness of breath or wheezing).    . Lancets MISC Check glucose daily.  Diabetes 250.00 100 each 11  . levothyroxine (SYNTHROID, LEVOTHROID) 150 MCG tablet TAKE 1 TABLET  BY MOUTH EVERY DAY 90 tablet 0  . losartan (COZAAR) 25 MG tablet TAKE 1 TABLET BY MOUTH EVERY DAY 90 tablet 2  . pantoprazole (PROTONIX) 40 MG tablet Take 1 tablet (40 mg total) by mouth daily. 90 tablet 3  . Propylene Glycol (SYSTANE BALANCE) 0.6 % SOLN Place 1 drop into both eyes daily as needed (for dry eyes).     No current facility-administered medications for this visit.     Allergies:   Patient has no known allergies.    Social History:  The patient  reports that she has never smoked. She has never used smokeless tobacco.  She reports current alcohol use. She reports that she does not use drugs.   Family History:  The patient's family history includes Cancer in her father; Heart attack in her mother; Heart disease in her mother; Hypertension in her mother; Lung cancer in her father; Pancreatic cancer in her father; Stroke in her paternal grandfather.   ROS:    As in HPI, otherwise ROS is negative   Physical Exam: Blood pressure (!) 102/50, pulse 75, height 4\' 11"  (1.499 m), weight 193 lb 12.8 oz (87.9 kg), SpO2 96 %.  GEN:   Elderly, moderately obese female.  NAD  HEENT: Normal NECK: No JVD; No carotid bruits LYMPHATICS: No lymphadenopathy CARDIAC: RRR   RESPIRATORY:  Clear to auscultation without rales, wheezing or rhonchi  ABDOMEN: Soft, non-tender, non-distended MUSCULOSKELETAL:  No edema; No deformity  SKIN: Warm and dry NEUROLOGIC:  Alert and oriented x 3    EKG:      Recent Labs: 08/03/2018: TSH 1.092 09/21/2018: ALT 10; BUN 21; Creatinine 0.84; Hemoglobin 9.9; Platelet Count 290; Potassium 3.5; Sodium 142    Lipid Panel    Component Value Date/Time   CHOL 165 05/12/2018 0913   TRIG 73 05/12/2018 0913   HDL 48 (L) 05/12/2018 0913   CHOLHDL 3.4 05/12/2018 0913   VLDL 15 05/03/2017 1021   LDLCALC 101 (H) 05/12/2018 0913      Wt Readings from Last 3 Encounters:  10/23/18 193 lb 12.8 oz (87.9 kg)  09/21/18 193 lb 6.4 oz (87.7 kg)  08/28/18 197 lb 1.6 oz  (89.4 kg)      Current medicines are reviewed  The patient understands her medications.     ASSESSMENT AND PLAN:  1. Chronic systolic congestive heart failure:   Was eating lots of salt.   She is breathing better now that she has improved her diet   2. ICD her ICD still has battery life.  She and Dr. Lovena [name] have discussed this and have decided to wait and not upgrade her ICD at this point.  3. Hypothyroidism -    4.   Anemia:   Further eval per her primary MD    Gabrielle Moores, MD  10/23/2018 10:44 AM    [address],  [place], [place]  [address] Pager [phone] Phone: [phone]; Fax: [phone]

## 2018-11-02 ENCOUNTER — Inpatient Hospital Stay: Payer: Medicare Other | Attending: Hematology and Oncology

## 2018-11-02 DIAGNOSIS — D539 Nutritional anemia, unspecified: Secondary | ICD-10-CM | POA: Insufficient documentation

## 2018-11-02 DIAGNOSIS — D520 Dietary folate deficiency anemia: Secondary | ICD-10-CM

## 2018-11-02 LAB — COMPREHENSIVE METABOLIC PANEL
ALT: 8 U/L (ref 0–44)
AST: 10 U/L — ABNORMAL LOW (ref 15–41)
Albumin: 4 g/dL (ref 3.5–5.0)
Alkaline Phosphatase: 69 U/L (ref 38–126)
Anion gap: 10 (ref 5–15)
BUN: 20 mg/dL (ref 8–23)
CO2: 34 mmol/L — ABNORMAL HIGH (ref 22–32)
Calcium: 9.1 mg/dL (ref 8.9–10.3)
Chloride: 100 mmol/L (ref 98–111)
Creatinine, Ser: 0.83 mg/dL (ref 0.44–1.00)
GFR calc non Af Amer: >60 mL/min (ref >60)
Glucose, Bld: 95 mg/dL (ref 70–99)
Potassium: 3.4 mmol/L — ABNORMAL LOW (ref 3.5–5.1)
Sodium: 144 mmol/L (ref 135–145)
Total Bilirubin: 1 mg/dL (ref 0.3–1.2)
Total Protein: 6.9 g/dL (ref 6.5–8.1)

## 2018-11-02 LAB — CBC WITH DIFFERENTIAL/PLATELET
Abs Immature Granulocytes: 0.01 10*3/uL (ref 0.00–0.07)
Basophils Absolute: 0 10*3/uL (ref 0.0–0.1)
Basophils Relative: 1 %
Eosinophils Absolute: 0 10*3/uL (ref 0.0–0.5)
Eosinophils Relative: 1 %
HEMATOCRIT: 27.2 % — AB (ref 36.0–46.0)
Hemoglobin: 8.7 g/dL — ABNORMAL LOW (ref 12.0–15.0)
Immature Granulocytes: 0 %
Lymphocytes Relative: 42 %
Lymphs Abs: 1.7 10*3/uL (ref 0.7–4.0)
MCH: 33.9 pg (ref 26.0–34.0)
MCHC: 32 g/dL (ref 30.0–36.0)
MCV: 105.8 fL — ABNORMAL HIGH (ref 80.0–100.0)
Monocytes Absolute: 0.2 10*3/uL (ref 0.1–1.0)
Monocytes Relative: 5 %
Neutro Abs: 2.1 10*3/uL (ref 1.7–7.7)
Neutrophils Relative %: 51 %
Platelets: 290 10*3/uL (ref 150–400)
RBC: 2.57 MIL/uL — ABNORMAL LOW (ref 3.87–5.11)
RDW: 24.5 % — AB (ref 11.5–15.5)
WBC: 4 10*3/uL (ref 4.0–10.5)
nRBC: 0.7 % — ABNORMAL HIGH (ref 0.0–0.2)

## 2018-11-02 LAB — LACTATE DEHYDROGENASE: LDH: 179 U/L (ref 98–192)

## 2018-11-02 LAB — FERRITIN: Ferritin: 289 ng/mL (ref 11–307)

## 2018-11-03 LAB — HAPTOGLOBIN: Haptoglobin: 79 mg/dL (ref 42–346)

## 2018-11-07 ENCOUNTER — Telehealth: Payer: Self-pay | Admitting: Internal Medicine

## 2018-11-07 NOTE — Telephone Encounter (Signed)
Called patient - per pt request - sooner appt found - left message with new date and time and if pt wants to cancel give [place] a call back

## 2018-11-17 ENCOUNTER — Other Ambulatory Visit: Payer: Self-pay

## 2018-11-17 ENCOUNTER — Inpatient Hospital Stay: Payer: Medicare Other | Attending: Hematology and Oncology | Admitting: Internal Medicine

## 2018-11-17 ENCOUNTER — Telehealth: Payer: Self-pay | Admitting: Internal Medicine

## 2018-11-17 VITALS — BP 103/33 | HR 84 | Temp 97.9°F | Resp 18 | Ht 59.0 in | Wt 194.7 lb

## 2018-11-17 DIAGNOSIS — Z79899 Other long term (current) drug therapy: Secondary | ICD-10-CM | POA: Insufficient documentation

## 2018-11-17 DIAGNOSIS — I10 Essential (primary) hypertension: Secondary | ICD-10-CM | POA: Insufficient documentation

## 2018-11-17 DIAGNOSIS — E039 Hypothyroidism, unspecified: Secondary | ICD-10-CM

## 2018-11-17 DIAGNOSIS — K317 Polyp of stomach and duodenum: Secondary | ICD-10-CM | POA: Insufficient documentation

## 2018-11-17 DIAGNOSIS — K219 Gastro-esophageal reflux disease without esophagitis: Secondary | ICD-10-CM | POA: Diagnosis not present

## 2018-11-17 DIAGNOSIS — Z800 Family history of malignant neoplasm of digestive organs: Secondary | ICD-10-CM | POA: Diagnosis not present

## 2018-11-17 DIAGNOSIS — R195 Other fecal abnormalities: Secondary | ICD-10-CM | POA: Insufficient documentation

## 2018-11-17 DIAGNOSIS — E611 Iron deficiency: Secondary | ICD-10-CM

## 2018-11-17 DIAGNOSIS — D539 Nutritional anemia, unspecified: Secondary | ICD-10-CM | POA: Diagnosis not present

## 2018-11-17 DIAGNOSIS — R1310 Dysphagia, unspecified: Secondary | ICD-10-CM | POA: Insufficient documentation

## 2018-11-17 DIAGNOSIS — Z801 Family history of malignant neoplasm of trachea, bronchus and lung: Secondary | ICD-10-CM | POA: Diagnosis not present

## 2018-11-17 DIAGNOSIS — D520 Dietary folate deficiency anemia: Secondary | ICD-10-CM

## 2018-11-17 DIAGNOSIS — D7589 Other specified diseases of blood and blood-forming organs: Secondary | ICD-10-CM | POA: Diagnosis not present

## 2018-11-17 DIAGNOSIS — Z86010 Personal history of colonic polyps: Secondary | ICD-10-CM | POA: Insufficient documentation

## 2018-11-17 NOTE — Progress Notes (Signed)
Diagnosis Iron deficiency - Plan: CMP ([place] only), Lactate dehydrogenase (LDH), CBC with Differential (Cancer Center Only), Ferritin, Vitamin B12, Folate, Serum, Methylmalonic acid, serum, Haptoglobin  Anemia, macrocytic, nutritional - Plan: CMP ([facility] only), Lactate dehydrogenase (LDH), CBC with Differential ([facility] Only), Ferritin, Vitamin B12, Folate, Serum, Methylmalonic acid, serum, Haptoglobin  Staging Cancer Staging No matching staging information was found for the patient.  Assessment and Plan:  1.  Macrocytic anemia.  73 year old previously followed by Dr. Audelia Hives.  Pt has past medical history significant for dyslipidemia; compensated systolic congestive heart failure; placement of a defibrillator in 2011; nonischemic cardiomyopathy; bilateral nonsurgical cataracts; hypothyroid disease; mitral regurgitation; elevated BMI; diverticular disease; gastroesophageal reflux disease with dysphagia; and a serrated sessile polyp (2) identified initially 3 years earlier. Her primary care physician is Dr. Tedra Senegal.  Her gastroenterologist is Dr. Zenovia Jarred.  Over the past 6 months, she was aware that she was anemic.   A review of her laboratory studies from May 01, 2015 show hemoglobin 11.6 hematocrit 33.3 WBC 3.8; platelets 307,000.  On December 05, 2015: A complete blood count showed hemoglobin 11.1 hematocrit 33.4 WBC 5.6; platelets 339,000.  On May 03, 2017: A complete blood count showed hemoglobin 10.9 hematocrit 33.9 MCV 103.7 MCH 33.3 RDW 17.4 WBC 5.0 with 47% neutrophils 43% lymphocytes 7% monocytes 2% eosinophils 1% basophil; platelets 349,000.  More recently, on May 12, 2018: A complete blood count showed hemoglobin 9.7 hematocrit 29.0 MCV 101.4 MCH 33.9 RDW 17.3 WBC 4.2 with 47% neutrophils 43% lymphocytes 7% monocytes 2% eosinophils 1% basophil; platelets 333,000.  On May 25, 2018: A complete blood count showed hemoglobin 9.6 hematocrit 28.8 MCV 101.4 MCH  33.8 RDW 17.9 WBC 3.8 with 39% neutrophils 51% lymphocytes 7% monocytes 2% eosinophils 1% basophil; platelets 338,000.  She  had in the past 2 colonoscopies.  In 2015 a Cologuard was positive.  A serrated sessile polyp was removed from the ascending colon at that time.  A follow-up colonoscopy one year later on February 25, 2015 revealed a second 5 mm sessile serrated polyp in the descending colon. The prior polypectomy site in the ascending colon showed no residual polyp.  She denies any bright red blood per rectum or melena.  She has no significant change in bowel habits although her stools vary from constipation to loose to normal.  She denies abdominal pain or tenesmus.  She has had dyspepsia and dysphagia in spite of omeprazole. She denies odynophagia. Solid food seems to get stuck in her epigastrium.   Labs done 09/21/2018 reviewed and showed WBC 4.2 HB 9.9 plts 290,000.  Chemistries WNL with K+ 3.5 Cr 0.84 and normal LFTs.  Labs done 08/03/2018 showed low normal ferritin of 47.    Pt was treated with IV iron 09/22/2018 and 09/29/2018.    Labs done 11/02/2018 reviewed and showed WBC 4 HB 8.7 plts 290,000.  Chemistries WNL with K+ 3.4 Cr 0.83 normal LFTs.  Ferritin improved at 289.  Pt reports she feels symptomatically improved after IV iron.   I discussed with her that HB remains decreased at 8.7.  She will begin folic acid and oral A19 supplements. Pt will RTC in 3 weeks with repeat labs to go over results.   If no improvement in counts she will be recommended for bone marrow biopsy for further evaluation due to persistent macrocytosis to rule out MDS.   2.  Hypothyroidism.  Recent TSH WNL.  Follow-up with PCP for monitoring.   3.  HTN.  BP is 103/33.  Follow-up with PCP.   4.  Health maintenance.  Pt had colonoscopy in 2016 that showed a polyp but no evidence of dysplasia.  EGD done 08/07/2018 showed single gastric polyp and evidence of gastritis.  Follow-up with GI as recommended.  Mammogram was  05/2018 and was negative.  Continue mammogram screenings as recommended.    25 minutes spent with more than 50% spent in review of records, counseling and coordination of care.    Interval History:  Historical data obtained from note dated 08/03/2018.  73 year old female previously followed by Dr. Audelia Hives.  Pt has history of serrated sessile polyp (2) identified initially 3 years earlier. Her primary care physician is Dr. Tedra Senegal.  Her gastroenterologist is Dr. Zenovia Jarred.  Over the past 6 months, she was aware that she was anemic.   Laboratory studies from May 01, 2015 show hemoglobin 11.6 hematocrit 33.3 WBC 3.8; platelets 307,000.  On December 05, 2015: A complete blood count showed hemoglobin 11.1 hematocrit 33.4 WBC 5.6; platelets 339,000.  On May 03, 2017: A complete blood count showed hemoglobin 10.9 hematocrit 33.9 MCV 103.7 MCH 33.3 RDW 17.4 WBC 5.0 with 47% neutrophils 43% lymphocytes 7% monocytes 2% eosinophils 1% basophil; platelets 349,000.  More recently, on May 12, 2018: A complete blood count showed hemoglobin 9.7 hematocrit 29.0 MCV 101.4 MCH 33.9 RDW 17.3 WBC 4.2 with 47% neutrophils 43% lymphocytes 7% monocytes 2% eosinophils 1% basophil; platelets 333,000.  On May 25, 2018: A complete blood count showed hemoglobin 9.6 hematocrit 28.8 MCV 101.4 MCH 33.8 RDW 17.9 WBC 3.8 with 39% neutrophils 51% lymphocytes 7% monocytes 2% eosinophils 1% basophil; platelets 338,000.  She has had 2 colonoscopies.  In 2015 a Cologuard was positive.  A serrated sessile polyp was removed from the ascending colon at that time.  A follow-up colonoscopy one year later on February 25, 2015 revealed a second 5 mm sessile serrated polyp in the descending colon. The prior polypectomy site in the ascending colon showed no residual polyp.  She denies any bright red blood per rectum or melena.  She has no significant change in bowel habits although her stools vary from constipation to loose to normal.  She  denies abdominal pain or tenesmus.  She has had dyspepsia and dysphagia in spite of omeprazole. She denies odynophagia. Solid food seems to get stuck in her epigastrium.    Current Status:  Pt is seen today for follow-up to go over labs.  She reports energy level improved after IV iron.    Problem List Patient Active Problem List   Diagnosis Date Noted  . Iron deficiency [E61.1] 09/21/2018  . Anemia [D64.9] 08/03/2018  . Impaired glucose tolerance [R73.02] 06/21/2017  . Cough variant asthma  vs UACS from ACEi  [J45.991] 12/31/2016  . Essential hypertension [I10] 12/31/2016  . Morbid obesity due to excess calories ([facility]) [E66.01] 12/31/2016  . History of colonic polyps [Z86.010]   . Benign neoplasm of descending colon [D12.4]   . Diverticulosis of colon without hemorrhage [K57.30] 01/28/2014  . 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 Nonspecific abnormal finding in stool contents [R19.5] 11/29/2013  . Colon cancer screening [Z12.11] 11/29/2013  . History of depression [Z86.59] 08/11/2012  . Chronic systolic CHF (congestive heart failure) ([place]) [I50.22]   . Systolic CHF, chronic ([place]) [I50.22]   . LBBB (left bundle branch block) [I44.7]   . Automatic implantable cardioverter-defibrillator in situ [Z95.810]   . Hypothyroidism [E03.9]   . GERD (  gastroesophageal reflux disease) [K21.9]   . Dyslipidemia [E78.5]   . Mitral regurgitation [I34.0]   . Overweight [E66.3]   . Ejection fraction < 50% [R94.30]     Past Medical History Past Medical History:  Diagnosis Date  . Automatic implantable cardioverter-defibrillator in situ    Dr. Beckie Salts follows-next visit- [id number]  . Cataract   . Diverticulosis   . Dyslipidemia   . Ejection fraction < 50%    EF 25-30%, September, 2010  . GERD (gastroesophageal reflux disease)   . Hypothyroidism   . ICD (implantable cardiac defibrillator) in place    10/2009; Dr. Lovena Le  . Iron deficiency 09/21/2018  . LBBB (left bundle branch block)     old  . Macrocytic anemia   . Mitral regurgitation    Mild, echo, September, 2010  . Nonischemic cardiomyopathy ([facility])    Normal coronary arteries, catheterization, September, 2009  . Overweight(278.02)   . Serrated polyp of colon   . Systolic CHF, chronic ([facility])    no recent problems    Past Surgical History Past Surgical History:  Procedure Laterality Date  . BIOPSY  08/07/2018   Procedure: BIOPSY;  Surgeon: Jerene Bears, MD;  Location: [facility];  Service: Gastroenterology;;  . CARDIAC CATHETERIZATION     '09 last  . [place]    . COLONOSCOPY N/A 11/29/2013   Procedure: COLONOSCOPY;  Surgeon: Jerene Bears, MD;  Location: [facility];  Service: Gastroenterology;  Laterality: N/A;  . COLONOSCOPY N/A 02/25/2015   Procedure: COLONOSCOPY;  Surgeon: Jerene Bears, MD;  Location: [facility];  Service: Gastroenterology;  Laterality: N/A;  . ESOPHAGOGASTRODUODENOSCOPY (EGD) WITH PROPOFOL N/A 08/07/2018   Procedure: ESOPHAGOGASTRODUODENOSCOPY (EGD) WITH PROPOFOL;  Surgeon: Jerene Bears, MD;  Location: [facility];  Service: Gastroenterology;  Laterality: N/A;  . EYE SURGERY Left    x 3-"droopy eyelid"  . heart catherization    . TONSILLECTOMY  1952    Family History Family History  Problem Relation Age of Onset  . Heart attack Mother   . Heart disease Mother   . Hypertension Mother   . Lung cancer Father   . Pancreatic cancer Father   . Cancer Father   . Stroke Paternal Grandfather   . Colon cancer Neg Hx   . Diabetes Neg Hx   . Kidney disease Neg Hx   . Liver disease Neg Hx      Social History  reports that she has never smoked. She has never used smokeless tobacco. She reports current alcohol use. She reports that she does not use drugs.  Medications  Current Outpatient Medications:  .  albuterol (PROVENTIL HFA;VENTOLIN HFA) 108 (90 Base) MCG/ACT inhaler, Inhale 2 puffs into the lungs every 6 (six) hours as needed for wheezing or shortness of  breath., Disp: 1 Inhaler, Rfl: prn .  carvedilol (COREG) 25 MG tablet, TAKE 1 TABLET BY MOUTH TWICE A DAY, Disp: 180 tablet, Rfl: 2 .  folic acid (FOLVITE) 1 MG tablet, Take 1 tablet (1 mg total) by mouth daily., Disp: 90 tablet, Rfl: 3 .  furosemide (LASIX) 40 MG tablet, TAKE 1 TABLET BY MOUTH EVERY DAY, Disp: 90 tablet, Rfl: 2 .  glucose blood (ACCU-CHEK AVIVA) test strip, Check daily.  For Diabetes 250.00, Disp: 100 each, Rfl: 12 .  ipratropium-albuterol (DUONEB) 0.5-2.5 (3) MG/3ML SOLN, Take 3 mLs by nebulization every 4 (four) hours as needed (shortness of breath or wheezing)., Disp: , Rfl:  .  Lancets MISC, Check glucose  daily.  Diabetes 250.00, Disp: 100 each, Rfl: 11 .  levothyroxine (SYNTHROID, LEVOTHROID) 150 MCG tablet, TAKE 1 TABLET BY MOUTH EVERY DAY, Disp: 90 tablet, Rfl: 0 .  losartan (COZAAR) 25 MG tablet, TAKE 1 TABLET BY MOUTH EVERY DAY, Disp: 90 tablet, Rfl: 2 .  pantoprazole (PROTONIX) 40 MG tablet, Take 1 tablet (40 mg total) by mouth daily., Disp: 90 tablet, Rfl: 3 .  Propylene Glycol (SYSTANE BALANCE) 0.6 % SOLN, Place 1 drop into both eyes daily as needed (for dry eyes)., Disp: , Rfl:   Allergies Patient has no known allergies.  Review of Systems Review of Systems - Oncology ROS negative   Physical Exam  Vitals Wt Readings from Last 3 Encounters:  11/17/18 194 lb 11.2 oz (88.3 kg)  10/23/18 193 lb 12.8 oz (87.9 kg)  09/21/18 193 lb 6.4 oz (87.7 kg)   Temp Readings from Last 3 Encounters:  11/17/18 97.9 F (36.6 C) (Oral)  09/29/18 98.3 F (36.8 C) (Oral)  09/22/18 98.5 F (36.9 C) (Oral)   BP Readings from Last 3 Encounters:  11/17/18 (!) 103/33  10/23/18 (!) 102/50  09/29/18 (!) 115/50   Pulse Readings from Last 3 Encounters:  11/17/18 84  10/23/18 75  09/29/18 76   Constitutional: Well-developed, well-nourished, and in no distress.   HENT: Head: Normocephalic and atraumatic.  Mouth/Throat: No oropharyngeal exudate. Mucosa moist. Eyes: Pupils  are equal, round, and reactive to light. Conjunctivae are normal. No scleral icterus.  Neck: Normal range of motion. Neck supple. No JVD present.  Cardiovascular: Normal rate, regular rhythm and normal heart sounds.  Exam reveals no gallop and no friction rub.   No murmur heard. Pulmonary/Chest: Effort normal and breath sounds normal. No respiratory distress. No wheezes.No rales.  Abdominal: Soft. Bowel sounds are normal. No distension. There is no tenderness. There is no guarding.  Musculoskeletal: No edema or tenderness.  Lymphadenopathy: No cervical, axillary or supraclavicular adenopathy.  Neurological: Alert and oriented to person, place, and time. No cranial nerve deficit.  Skin: Skin is warm and dry. No rash noted. No erythema. No pallor.  Psychiatric: Affect and judgment normal.   Labs No visits with results within 3 Day(s) from this visit.  Latest known visit with results is:  Appointment on 11/02/2018  Component Date Value Ref Range Status  . Haptoglobin 11/02/2018 79  42 - 346 mg/dL Final   Comment: (NOTE) Performed At: [facility] [place], [place] [id number] Rush Farmer MD IF:[id number]   . Ferritin 11/02/2018 289  11 - 307 ng/mL Final   Performed at [facility], [place] [address] [place], [place] [address]  . LDH 11/02/2018 179  98 - 192 U/L Final   Performed at [facility], [place] [address], [place], [place] [address]  . Sodium 11/02/2018 144  135 - 145 mmol/L Final  . Potassium 11/02/2018 3.4* 3.5 - 5.1 mmol/L Final  . Chloride 11/02/2018 100  98 - 111 mmol/L Final  . CO2 11/02/2018 34* 22 - 32 mmol/L Final  . Glucose, Bld 11/02/2018 95  70 - 99 mg/dL Final  . BUN 11/02/2018 20  8 - 23 mg/dL Final  . Creatinine, Ser 11/02/2018 0.83  0.44 - 1.00 mg/dL Final  . Calcium 11/02/2018 9.1  8.9 - 10.3 mg/dL Final  . Total Protein 11/02/2018 6.9  6.5 - 8.1 g/dL Final  . Albumin 11/02/2018 4.0  3.5 -  5.0 g/dL Final  . AST 11/02/2018 10* 15 -  41 U/L Final  . ALT 11/02/2018 8  0 - 44 U/L Final  . Alkaline Phosphatase 11/02/2018 69  38 - 126 U/L Final  . Total Bilirubin 11/02/2018 1.0  0.3 - 1.2 mg/dL Final  . GFR calc non Af Amer 11/02/2018 >60  >60 mL/min Final  . GFR calc Af Amer 11/02/2018 >60  >60 mL/min Final  . Anion gap 11/02/2018 10  5 - 15 Final   Performed at [facility], [place] [address], [place], [place] [address]  . WBC 11/02/2018 4.0  4.0 - 10.5 K/uL Final  . RBC 11/02/2018 2.57* 3.87 - 5.11 MIL/uL Final  . Hemoglobin 11/02/2018 8.7* 12.0 - 15.0 g/dL Final  . HCT 11/02/2018 27.2* 36.0 - 46.0 % Final  . MCV 11/02/2018 105.8* 80.0 - 100.0 fL Final  . MCH 11/02/2018 33.9  26.0 - 34.0 pg Final  . MCHC 11/02/2018 32.0  30.0 - 36.0 g/dL Final  . RDW 11/02/2018 24.5* 11.5 - 15.5 % Final  . Platelets 11/02/2018 290  150 - 400 K/uL Final  . nRBC 11/02/2018 0.7* 0.0 - 0.2 % Final  . Neutrophils Relative % 11/02/2018 51  % Final  . Neutro Abs 11/02/2018 2.1  1.7 - 7.7 K/uL Final  . Lymphocytes Relative 11/02/2018 42  % Final  . Lymphs Abs 11/02/2018 1.7  0.7 - 4.0 K/uL Final  . Monocytes Relative 11/02/2018 5  % Final  . Monocytes Absolute 11/02/2018 0.2  0.1 - 1.0 K/uL Final  . Eosinophils Relative 11/02/2018 1  % Final  . Eosinophils Absolute 11/02/2018 0.0  0.0 - 0.5 K/uL Final  . Basophils Relative 11/02/2018 1  % Final  . Basophils Absolute 11/02/2018 0.0  0.0 - 0.1 K/uL Final  . Immature Granulocytes 11/02/2018 0  % Final  . Abs Immature Granulocytes 11/02/2018 0.01  0.00 - 0.07 K/uL Final   Performed at [facility], [place] [address] [place], [place] [address]     Pathology Orders Placed This Encounter  Procedures  . CMP ([place] only)    Standing Status:   Future    Standing Expiration Date:   11/17/2019  . Lactate dehydrogenase (LDH)    Standing Status:   Future    Standing Expiration Date:   11/17/2019  . CBC  with Differential (Cancer Center Only)    Standing Status:   Future    Standing Expiration Date:   11/17/2019  . Ferritin    Standing Status:   Future    Standing Expiration Date:   11/17/2019  . Vitamin B12    Standing Status:   Future    Standing Expiration Date:   11/17/2019  . Folate, Serum    Standing Status:   Future    Standing Expiration Date:   11/17/2019  . Methylmalonic acid, serum    Standing Status:   Future    Standing Expiration Date:   11/17/2019  . Haptoglobin    Standing Status:   Future    Standing Expiration Date:   11/17/2019       Zoila Shutter MD

## 2018-11-17 NOTE — Telephone Encounter (Signed)
No los °

## 2018-11-20 ENCOUNTER — Ambulatory Visit (INDEPENDENT_AMBULATORY_CARE_PROVIDER_SITE_OTHER): Payer: Medicare Other | Admitting: *Deleted

## 2018-11-20 DIAGNOSIS — I428 Other cardiomyopathies: Secondary | ICD-10-CM

## 2018-11-20 DIAGNOSIS — I5022 Chronic systolic (congestive) heart failure: Secondary | ICD-10-CM

## 2018-11-21 LAB — CUP PACEART REMOTE DEVICE CHECK
Battery Remaining Longevity: 42 mo
Brady Statistic RV Percent Paced: 0 %
Date Time Interrogation Session: 20200309055100
HighPow Impedance: 48 Ohm
Implantable Lead Implant Date: 20110221
Implantable Lead Implant Date: 20110221
Implantable Lead Location: 753859
Implantable Lead Model: 157
Implantable Lead Serial Number: 301957
Implantable Pulse Generator Implant Date: 20110221
Lead Channel Impedance Value: 380 Ohm
Lead Channel Impedance Value: 479 Ohm
Lead Channel Pacing Threshold Amplitude: 0.1 V
Lead Channel Pacing Threshold Amplitude: 0.5 V
Lead Channel Pacing Threshold Pulse Width: 0.4 ms
Lead Channel Pacing Threshold Pulse Width: 0.4 ms
Lead Channel Setting Pacing Amplitude: 2 V
Lead Channel Setting Sensing Sensitivity: 0.6 mV
MDC IDC LEAD LOCATION: 753860
MDC IDC MSMT BATTERY REMAINING PERCENTAGE: 50 %
MDC IDC SET LEADCHNL RV PACING AMPLITUDE: 2.4 V
MDC IDC SET LEADCHNL RV PACING PULSEWIDTH: 0.4 ms
MDC IDC STAT BRADY RA PERCENT PACED: 0 %
Pulse Gen Serial Number: 160860

## 2018-11-23 ENCOUNTER — Ambulatory Visit: Payer: Medicare Other | Admitting: Internal Medicine

## 2018-11-24 ENCOUNTER — Other Ambulatory Visit: Payer: Self-pay

## 2018-11-24 ENCOUNTER — Ambulatory Visit: Payer: Medicare Other | Admitting: Internal Medicine

## 2018-11-24 ENCOUNTER — Ambulatory Visit (INDEPENDENT_AMBULATORY_CARE_PROVIDER_SITE_OTHER): Payer: Medicare Other | Admitting: Internal Medicine

## 2018-11-24 VITALS — BP 110/80 | HR 64 | Temp 99.8°F | Ht 59.0 in | Wt 194.0 lb

## 2018-11-24 DIAGNOSIS — I428 Other cardiomyopathies: Secondary | ICD-10-CM

## 2018-11-24 DIAGNOSIS — J0100 Acute maxillary sinusitis, unspecified: Secondary | ICD-10-CM

## 2018-11-24 DIAGNOSIS — E039 Hypothyroidism, unspecified: Secondary | ICD-10-CM | POA: Diagnosis not present

## 2018-11-24 DIAGNOSIS — D539 Nutritional anemia, unspecified: Secondary | ICD-10-CM | POA: Diagnosis not present

## 2018-11-24 DIAGNOSIS — R5383 Other fatigue: Secondary | ICD-10-CM | POA: Diagnosis not present

## 2018-11-24 DIAGNOSIS — R0602 Shortness of breath: Secondary | ICD-10-CM

## 2018-11-24 DIAGNOSIS — I10 Essential (primary) hypertension: Secondary | ICD-10-CM

## 2018-11-24 LAB — CBC WITH DIFFERENTIAL/PLATELET
Absolute Monocytes: 249 cells/uL (ref 200–950)
Basophils Absolute: 17 cells/uL (ref 0–200)
Basophils Relative: 0.3 %
Eosinophils Absolute: 17 cells/uL (ref 15–500)
Eosinophils Relative: 0.3 %
HCT: 21.1 % — ABNORMAL LOW (ref 35.0–45.0)
Hemoglobin: 7.2 g/dL — ABNORMAL LOW (ref 11.7–15.5)
Lymphs Abs: 1740 cells/uL (ref 850–3900)
MCH: 35.5 pg — ABNORMAL HIGH (ref 27.0–33.0)
MCHC: 34.1 g/dL (ref 32.0–36.0)
MCV: 103.9 fL — ABNORMAL HIGH (ref 80.0–100.0)
MONOS PCT: 4.3 %
MPV: 11.2 fL (ref 7.5–12.5)
NEUTROS ABS: 3776 {cells}/uL (ref 1500–7800)
Neutrophils Relative %: 65.1 %
Platelets: 264 10*3/uL (ref 140–400)
RBC: 2.03 10*6/uL — ABNORMAL LOW (ref 3.80–5.10)
RDW: 23.2 % — ABNORMAL HIGH (ref 11.0–15.0)
Total Lymphocyte: 30 %
WBC: 5.8 10*3/uL (ref 3.8–10.8)

## 2018-11-24 LAB — BRAIN NATRIURETIC PEPTIDE: Brain Natriuretic Peptide: 761 pg/mL — ABNORMAL HIGH (ref <100)

## 2018-11-24 LAB — TSH: TSH: 0.76 mIU/L (ref 0.40–4.50)

## 2018-11-24 LAB — CBC MORPHOLOGY

## 2018-11-24 MED ORDER — FOLIC ACID 1 MG PO TABS: 1.0000 mg | ORAL_TABLET | Freq: Every day | ORAL | 0 refills | Status: DC

## 2018-11-24 MED ORDER — DOXYCYCLINE HYCLATE 100 MG PO TABS: 100.0000 mg | ORAL_TABLET | Freq: Two times a day (BID) | ORAL | 0 refills | Status: DC

## 2018-11-24 NOTE — Progress Notes (Addendum)
   Subjective:    Patient ID: [name], female    DOB: 20-Apr-1946, 73 y.o.   MRN: [id number]  HPI 73 year old Female in today with several questions about her medical issues.  She is being seen at the cancer center by hematologist for macrocytic anemia.  She has fatigue.  She also has nonischemic cardiomyopathy.  She says she was told after her endoscopy in November 2019 that she should have a sleep study.  She wanted to know why she was told that.  Explained to her that during the procedure in November 2019 that she had acute oxygen desaturation.  Anesthesia was called to assist.  A single 7 mm semi-sessile polyp was found on the greater curvature of the gastric antrum.  Biopsies showed reactive gastropathy and mild gastritis.  She has had extensive hematology testing.  Immunofixation showed a normal pattern.  Peripheral review of smear revealed macrocytic anemia with polychromasia and elliptocytes.  Rare nucleated red blood cells noted.  Few hypogranular neutrophils and pseudo-[place] forms.  Rare blast was noted.  Explained to patient the cause of her anemia was not clear but possibly could be myelodysplastic syndrome.  Hematologist suggested bone marrow and she has a follow-up appointment in April.  Patient says she is fatigued and somewhat short of breath.  Repeated lab work today.  This was done at her request.  CBC shows hemoglobin 7.2 g with MCV 103.9.  White blood cell count is 5800.  Platelet count 264,000.  BNP was checked because of complaint of shortness of breath and was 761.      Review of Systems new complaint is cough.  Has maxillary sinus pressure.  No fever or shaking chills.  No travel history.     Objective:   Physical Exam Neck is supple.  Chest clear to auscultation.  Cardiac exam regular rate and rhythm.  She looks fatigued.  TMs and pharynx are clear.       Assessment & Plan:  Macrocytic anemia that has been persistent.  I will contact Hematologist  regarding earlier appointment.  CBC drawn and hemoglobin 7.2 g.  Hypothyroidism-TSH is stable  BNP slightly elevated but patient's heart failure seems to be stable at present time.  Have suggested she check in with cardiologist and let them know what is going on.  Acute maxillary sinusitis-treat with doxycycline 100 mg twice daily for 10 days.  25 minutes spent with patient.  Discussion regarding possible sleep apnea and hypoxic event during endoscopy.  Discussion regarding persistent anemia and ramifications of that.  All questions answered.  Addendum: Patient will see Hematologist later this week.

## 2018-11-27 ENCOUNTER — Other Ambulatory Visit: Payer: Self-pay | Admitting: Internal Medicine

## 2018-11-27 ENCOUNTER — Telehealth: Payer: Self-pay

## 2018-11-27 ENCOUNTER — Telehealth: Payer: Self-pay | Admitting: Cardiovascular Disease

## 2018-11-27 DIAGNOSIS — D520 Dietary folate deficiency anemia: Secondary | ICD-10-CM

## 2018-11-27 NOTE — Telephone Encounter (Signed)
TC to pt per Dr Walden Field. I set up appointment for blood transfusion tomorrow 11/28/18 @ 1:30pm for 1 unit of blood due to low HGB. Pt made aware of date and time. No further problems or concerns at this time.

## 2018-11-27 NOTE — Telephone Encounter (Signed)
TC to pt to let her know that I set her lab appointment for her type and screen tomorrow 11/28/18 @ 1:15pm so to get here tomorrow at 1:00p . Pt verbalized understanding. No further problems or concerns at this time.

## 2018-11-27 NOTE — Telephone Encounter (Signed)
New Message         Pt c/o swelling: STAT is pt has developed SOB within 24 hours  1) How much weight have you gained and in what time span? 3lbs  2) If swelling, where is the swelling located? Ankles and Feet  3) Are you currently taking a fluid pill? Yes  4) Are you currently SOB? Yes while moving  5) Do you have a log of your daily weights (if so, list)? No  6) Have you gained 3 pounds in a day or 5 pounds in a week? Not sure  Have you traveled recently? No

## 2018-11-27 NOTE — Telephone Encounter (Signed)
Left message for patient to call back  

## 2018-11-27 NOTE — Telephone Encounter (Signed)
Follow up:     Patient returning call back to the nurse. Patient would like for some one to call her back

## 2018-11-27 NOTE — Telephone Encounter (Signed)
pts EF is 25-30% at baseline I suspect her leg swelling is due to her anemia

## 2018-11-27 NOTE — Telephone Encounter (Signed)
Spoke with patient who states her feet and legs look and feel swollen; states weight has not increased; denies SOB. She has hgb of 7.2 g/dL and is scheduled for a blood transfusion tomorrow. She admits to sitting more over the last few days due to decreased energy level. She does not have a lounge doctor leg rest so I advised her to elevate legs above the level of her heart while sitting. She is taking lasix 40 mg daily. I advised her to continue current medications and to call back if symptoms do not improve. I advised her to call back sooner if she experiences SOB or weight gain. She verbalized understanding and agreement with plan and thanked me for the call.

## 2018-11-28 ENCOUNTER — Other Ambulatory Visit: Payer: Self-pay | Admitting: Internal Medicine

## 2018-11-28 ENCOUNTER — Inpatient Hospital Stay: Payer: Medicare Other

## 2018-11-28 ENCOUNTER — Telehealth: Payer: Self-pay

## 2018-11-28 ENCOUNTER — Other Ambulatory Visit: Payer: Self-pay

## 2018-11-28 DIAGNOSIS — I10 Essential (primary) hypertension: Secondary | ICD-10-CM | POA: Diagnosis not present

## 2018-11-28 DIAGNOSIS — D539 Nutritional anemia, unspecified: Secondary | ICD-10-CM | POA: Diagnosis not present

## 2018-11-28 DIAGNOSIS — R1310 Dysphagia, unspecified: Secondary | ICD-10-CM | POA: Diagnosis not present

## 2018-11-28 DIAGNOSIS — D520 Dietary folate deficiency anemia: Secondary | ICD-10-CM

## 2018-11-28 DIAGNOSIS — E039 Hypothyroidism, unspecified: Secondary | ICD-10-CM | POA: Diagnosis not present

## 2018-11-28 DIAGNOSIS — D7589 Other specified diseases of blood and blood-forming organs: Secondary | ICD-10-CM | POA: Diagnosis not present

## 2018-11-28 DIAGNOSIS — R195 Other fecal abnormalities: Secondary | ICD-10-CM | POA: Diagnosis not present

## 2018-11-28 LAB — PREPARE RBC (CROSSMATCH)

## 2018-11-28 LAB — ABO/RH: ABO/RH(D): O POS

## 2018-11-28 MED ORDER — ACETAMINOPHEN 325 MG PO TABS
ORAL_TABLET | ORAL | Status: AC
  Filled 2018-11-28: qty 2

## 2018-11-28 MED ORDER — ACETAMINOPHEN 325 MG PO TABS
650.0000 mg | ORAL_TABLET | Freq: Once | ORAL | Status: AC
  Administered 2018-11-28: 650 mg via ORAL

## 2018-11-28 MED ORDER — DIPHENHYDRAMINE HCL 25 MG PO CAPS
25.0000 mg | ORAL_CAPSULE | Freq: Once | ORAL | Status: AC
  Administered 2018-11-28: 25 mg via ORAL

## 2018-11-28 MED ORDER — DIPHENHYDRAMINE HCL 25 MG PO CAPS
ORAL_CAPSULE | ORAL | Status: AC
  Filled 2018-11-28: qty 1

## 2018-11-28 MED ORDER — SODIUM CHLORIDE 0.9% IV SOLUTION
250.0000 mL | Freq: Once | INTRAVENOUS | Status: AC
  Administered 2018-11-28: 250 mL via INTRAVENOUS
  Filled 2018-11-28: qty 250

## 2018-11-28 NOTE — Progress Notes (Signed)
Remote ICD transmission.   

## 2018-11-28 NOTE — Telephone Encounter (Signed)
TC per Dr Walden Field to pt to let her know the appointment times for next week. 12/04/18 Lab appointment @8 :15am, doctors appointment 8:50am. Pt verbalized understanding. No further problems or concerns at this time.

## 2018-11-28 NOTE — Patient Instructions (Signed)
Blood Transfusion, Adult, Care After This sheet gives you information about how to care for yourself after your procedure. Your doctor may also give you more specific instructions. If you have problems or questions, contact your doctor. Follow these instructions at home:   Take over-the-counter and prescription medicines only as told by your doctor.  Go back to your normal activities as told by your doctor.  Follow instructions from your doctor about how to take care of the area where an IV tube was put into your vein (insertion site). Make sure you: ? Wash your hands with soap and water before you change your bandage (dressing). If there is no soap and water, use hand sanitizer. ? Change your bandage as told by your doctor.  Check your IV insertion site every day for signs of infection. Check for: ? More redness, swelling, or pain. ? More fluid or blood. ? Warmth. ? Pus or a bad smell. Contact a doctor if:  You have more redness, swelling, or pain around the IV insertion site.  You have more fluid or blood coming from the IV insertion site.  Your IV insertion site feels warm to the touch.  You have pus or a bad smell coming from the IV insertion site.  Your pee (urine) turns pink, red, or brown.  You feel weak after doing your normal activities. Get help right away if:  You have signs of a serious allergic or body defense (immune) system reaction, including: ? Itchiness. ? Hives. ? Trouble breathing. ? Anxiety. ? Pain in your chest or lower back. ? Fever, flushing, and chills. ? Fast pulse. ? Rash. ? Watery poop (diarrhea). ? Throwing up (vomiting). ? Dark pee. ? Serious headache. ? Dizziness. ? Stiff neck. ? Yellow color in your face or the white parts of your eyes (jaundice). Summary  After a blood transfusion, return to your normal activities as told by your doctor.  Every day, check for signs of infection where the IV tube was put into your vein.  Some  signs of infection are warm skin, more redness and pain, more fluid or blood, and pus or a bad smell where the needle went in.  Contact your doctor if you feel weak or have any unusual symptoms. This information is not intended to replace advice given to you by your health care provider. Make sure you discuss any questions you have with your health care provider. Document Released: 09/20/2014 Document Revised: 04/23/2016 Document Reviewed: 04/23/2016 Elsevier Interactive Patient Education  2019 [employer]  

## 2018-11-29 ENCOUNTER — Encounter: Payer: Self-pay | Admitting: Internal Medicine

## 2018-11-29 LAB — TYPE AND SCREEN
ABO/RH(D): O POS
Antibody Screen: NEGATIVE
UNIT DIVISION: 0

## 2018-11-29 LAB — BPAM RBC
Blood Product Expiration Date: 202003312359
ISSUE DATE / TIME: 202003171504
Unit Type and Rh: 5100

## 2018-11-29 NOTE — Patient Instructions (Signed)
Contact hematologist regarding persistent anemia, fatigue and shortness of breath.  TSH is stable.  BNP slightly elevated in the 700 range.  She will contact cardiologist.

## 2018-11-30 ENCOUNTER — Telehealth: Payer: Self-pay | Admitting: Internal Medicine

## 2018-11-30 NOTE — Telephone Encounter (Signed)
[name] called to say Thank You, if it had not been for you she would not have had her blood transfusion on Tuesday. She is feeling much better.

## 2018-12-01 ENCOUNTER — Telehealth: Payer: Self-pay

## 2018-12-01 NOTE — Telephone Encounter (Signed)
Contacted patient for screening prior to appointment scheduled for 3/23. Patient has not traveled recently, denies respiratory symptoms or fever, and denies contact with anyone who has traveled or is symptomatic. Patient is aware that she will be screened again at her appointment on Monday.

## 2018-12-04 ENCOUNTER — Telehealth: Payer: Self-pay | Admitting: Internal Medicine

## 2018-12-04 ENCOUNTER — Inpatient Hospital Stay (HOSPITAL_BASED_OUTPATIENT_CLINIC_OR_DEPARTMENT_OTHER): Payer: Medicare Other | Admitting: Internal Medicine

## 2018-12-04 ENCOUNTER — Other Ambulatory Visit: Payer: Self-pay

## 2018-12-04 ENCOUNTER — Inpatient Hospital Stay: Payer: Medicare Other

## 2018-12-04 VITALS — BP 111/56 | HR 80 | Temp 97.5°F | Resp 18 | Ht 59.0 in | Wt 192.2 lb

## 2018-12-04 DIAGNOSIS — R195 Other fecal abnormalities: Secondary | ICD-10-CM | POA: Diagnosis not present

## 2018-12-04 DIAGNOSIS — Z801 Family history of malignant neoplasm of trachea, bronchus and lung: Secondary | ICD-10-CM | POA: Diagnosis not present

## 2018-12-04 DIAGNOSIS — Z800 Family history of malignant neoplasm of digestive organs: Secondary | ICD-10-CM | POA: Diagnosis not present

## 2018-12-04 DIAGNOSIS — K219 Gastro-esophageal reflux disease without esophagitis: Secondary | ICD-10-CM | POA: Diagnosis not present

## 2018-12-04 DIAGNOSIS — R1310 Dysphagia, unspecified: Secondary | ICD-10-CM | POA: Diagnosis not present

## 2018-12-04 DIAGNOSIS — I10 Essential (primary) hypertension: Secondary | ICD-10-CM | POA: Diagnosis not present

## 2018-12-04 DIAGNOSIS — E039 Hypothyroidism, unspecified: Secondary | ICD-10-CM | POA: Diagnosis not present

## 2018-12-04 DIAGNOSIS — K317 Polyp of stomach and duodenum: Secondary | ICD-10-CM

## 2018-12-04 DIAGNOSIS — Z79899 Other long term (current) drug therapy: Secondary | ICD-10-CM

## 2018-12-04 DIAGNOSIS — D520 Dietary folate deficiency anemia: Secondary | ICD-10-CM

## 2018-12-04 DIAGNOSIS — D539 Nutritional anemia, unspecified: Secondary | ICD-10-CM | POA: Diagnosis not present

## 2018-12-04 DIAGNOSIS — D7589 Other specified diseases of blood and blood-forming organs: Secondary | ICD-10-CM

## 2018-12-04 DIAGNOSIS — Z86010 Personal history of colonic polyps: Secondary | ICD-10-CM

## 2018-12-04 LAB — CBC WITH DIFFERENTIAL/PLATELET
ABS IMMATURE GRANULOCYTES: 0.02 10*3/uL (ref 0.00–0.07)
BASOS ABS: 0 10*3/uL (ref 0.0–0.1)
Basophils Relative: 1 %
Eosinophils Absolute: 0 10*3/uL (ref 0.0–0.5)
Eosinophils Relative: 0 %
HCT: 25.5 % — ABNORMAL LOW (ref 36.0–46.0)
Hemoglobin: 8.3 g/dL — ABNORMAL LOW (ref 12.0–15.0)
IMMATURE GRANULOCYTES: 1 %
Lymphocytes Relative: 40 %
Lymphs Abs: 1.4 10*3/uL (ref 0.7–4.0)
MCH: 34.6 pg — ABNORMAL HIGH (ref 26.0–34.0)
MCHC: 32.5 g/dL (ref 30.0–36.0)
MCV: 106.3 fL — ABNORMAL HIGH (ref 80.0–100.0)
Monocytes Absolute: 0.2 10*3/uL (ref 0.1–1.0)
Monocytes Relative: 6 %
NEUTROS PCT: 52 %
NRBC: 1.2 % — AB (ref 0.0–0.2)
Neutro Abs: 1.8 10*3/uL (ref 1.7–7.7)
Platelets: 249 10*3/uL (ref 150–400)
RBC: 2.4 MIL/uL — ABNORMAL LOW (ref 3.87–5.11)
RDW: 22.7 % — ABNORMAL HIGH (ref 11.5–15.5)
WBC: 3.5 10*3/uL — ABNORMAL LOW (ref 4.0–10.5)

## 2018-12-04 LAB — COMPREHENSIVE METABOLIC PANEL
ALT: 8 U/L (ref 0–44)
AST: 10 U/L — ABNORMAL LOW (ref 15–41)
Albumin: 3.3 g/dL — ABNORMAL LOW (ref 3.5–5.0)
Alkaline Phosphatase: 53 U/L (ref 38–126)
Anion gap: 11 (ref 5–15)
BUN: 25 mg/dL — ABNORMAL HIGH (ref 8–23)
CHLORIDE: 100 mmol/L (ref 98–111)
CO2: 31 mmol/L (ref 22–32)
Calcium: 8.8 mg/dL — ABNORMAL LOW (ref 8.9–10.3)
Creatinine, Ser: 0.86 mg/dL (ref 0.44–1.00)
GFR calc Af Amer: >60 mL/min (ref >60)
GFR calc non Af Amer: >60 mL/min (ref >60)
Glucose, Bld: 107 mg/dL — ABNORMAL HIGH (ref 70–99)
Potassium: 3.5 mmol/L (ref 3.5–5.1)
Sodium: 142 mmol/L (ref 135–145)
Total Bilirubin: 0.9 mg/dL (ref 0.3–1.2)
Total Protein: 6.4 g/dL — ABNORMAL LOW (ref 6.5–8.1)

## 2018-12-04 LAB — FOLATE: Folate: 56.2 ng/mL (ref >5.9)

## 2018-12-04 LAB — LACTATE DEHYDROGENASE: LDH: 149 U/L (ref 98–192)

## 2018-12-04 LAB — VITAMIN B12: Vitamin B-12: 1081 pg/mL — ABNORMAL HIGH (ref 180–914)

## 2018-12-04 LAB — FERRITIN: FERRITIN: 408 ng/mL — AB (ref 11–307)

## 2018-12-04 NOTE — Progress Notes (Signed)
Diagnosis Anemia, macrocytic, nutritional - Plan: CT Biopsy, CT BONE MARROW BIOPSY & ASPIRATION, CBC with Differential (Cancer Center Only), CMP ([place] only), Lactate dehydrogenase (LDH)  Staging Cancer Staging No matching staging information was found for the patient.  Assessment and Plan:   1.  Macrocytic anemia.  73 year old previously followed by Dr. Audelia Hives.  Pt has past medical history significant for dyslipidemia; compensated systolic congestive heart failure; placement of a defibrillator in 2011; nonischemic cardiomyopathy; bilateral nonsurgical cataracts; hypothyroid disease; mitral regurgitation; elevated BMI; diverticular disease; gastroesophageal reflux disease with dysphagia; and a serrated sessile polyp (2) identified initially 3 years earlier. Her primary care physician is Dr. Tedra Senegal.  Her gastroenterologist is Dr. Zenovia Jarred.  Over the past 6 months, she was aware that she was anemic.   Review of her laboratory studies from May 01, 2015 show hemoglobin 11.6 hematocrit 33.3 WBC 3.8; platelets 307,000.  On December 05, 2015: A complete blood count showed hemoglobin 11.1 hematocrit 33.4 WBC 5.6; platelets 339,000.  On May 03, 2017: A complete blood count showed hemoglobin 10.9 hematocrit 33.9 MCV 103.7 MCH 33.3 RDW 17.4 WBC 5.0 with 47% neutrophils 43% lymphocytes 7% monocytes 2% eosinophils 1% basophil; platelets 349,000.  More recently, on May 12, 2018: A complete blood count showed hemoglobin 9.7 hematocrit 29.0 MCV 101.4 MCH 33.9 RDW 17.3 WBC 4.2 with 47% neutrophils 43% lymphocytes 7% monocytes 2% eosinophils 1% basophil; platelets 333,000.  On May 25, 2018: A complete blood count showed hemoglobin 9.6 hematocrit 28.8 MCV 101.4 MCH 33.8 RDW 17.9 WBC 3.8 with 39% neutrophils 51% lymphocytes 7% monocytes 2% eosinophils 1% basophil; platelets 338,000.  She  had in the past 2 colonoscopies.  In 2015 a Cologuard was positive.  A serrated sessile polyp was removed from  the ascending colon at that time.  A follow-up colonoscopy one year later on February 25, 2015 revealed a second 5 mm sessile serrated polyp in the descending colon. The prior polypectomy site in the ascending colon showed no residual polyp.  She denies any bright red blood per rectum or melena.  She has no significant change in bowel habits although her stools vary from constipation to loose to normal.  She denies abdominal pain or tenesmus.  She has had dyspepsia and dysphagia in spite of omeprazole. She denies odynophagia. Solid food seems to get stuck in her epigastrium.   Pt was treated with IV iron 09/22/2018 and 09/29/2018.    Labs done 11/02/2018 showed WBC 4 HB 8.7 plts 290,000.  Chemistries WNL with K+ 3.4 Cr 0.83 normal LFTs.  Ferritin improved at 289.   Labs done 11/24/2018 showed WBC 5.8 Hb 7.2 plts 264,000.  Pt was transfused 11/28/2018.    Labs done today 12/04/2018 reviewed and showed WBC 3.5 HB 8.3 plts 249,000.  Chemistries WNL with K+ 3.5 Cr 0.86 and normal LFTs.  Ferritin 408 B12 1081.    I discussed with her that HB remains decreased at 8.3 despite IV iron and blood transfusion. She is on folic acid and oral V89 supplements. Pt is recommended for bone marrow aspirate and biopsy for further evaluation due to persistent macrocytosis and anemia to rule out MDS.   She will RTC to go over results.    2.  Hypothyroidism.  Recent TSH WNL.  Follow-up with PCP for monitoring.   3.  HTN.  BP is 111/56.  Follow-up with PCP.   4.  Health maintenance.  Pt had colonoscopy in 2016 that showed a polyp but no evidence  of dysplasia.  EGD done 08/07/2018 showed single gastric polyp and evidence of gastritis.  Follow-up with GI as recommended.  Mammogram was 05/2018 and was negative.  Continue mammogram screenings as recommended.    25 minutes spent with more than 50% spent in review of records, counseling and coordination of care.    Interval History:  Historical data obtained from note dated 08/03/2018.   73 year old female previously followed by Dr. Audelia Hives.  Pt has history of serrated sessile polyp (2) identified initially 3 years earlier. Her primary care physician is Dr. Tedra Senegal.  Her gastroenterologist is Dr. Zenovia Jarred.  Over the past 6 months, she was aware that she was anemic.   Laboratory studies from May 01, 2015 show hemoglobin 11.6 hematocrit 33.3 WBC 3.8; platelets 307,000.  On December 05, 2015: A complete blood count showed hemoglobin 11.1 hematocrit 33.4 WBC 5.6; platelets 339,000.  On May 03, 2017: A complete blood count showed hemoglobin 10.9 hematocrit 33.9 MCV 103.7 MCH 33.3 RDW 17.4 WBC 5.0 with 47% neutrophils 43% lymphocytes 7% monocytes 2% eosinophils 1% basophil; platelets 349,000.  More recently, on May 12, 2018: A complete blood count showed hemoglobin 9.7 hematocrit 29.0 MCV 101.4 MCH 33.9 RDW 17.3 WBC 4.2 with 47% neutrophils 43% lymphocytes 7% monocytes 2% eosinophils 1% basophil; platelets 333,000.  On May 25, 2018: A complete blood count showed hemoglobin 9.6 hematocrit 28.8 MCV 101.4 MCH 33.8 RDW 17.9 WBC 3.8 with 39% neutrophils 51% lymphocytes 7% monocytes 2% eosinophils 1% basophil; platelets 338,000.  She has had 2 colonoscopies.  In 2015 a Cologuard was positive.  A serrated sessile polyp was removed from the ascending colon at that time.  A follow-up colonoscopy one year later on February 25, 2015 revealed a second 5 mm sessile serrated polyp in the descending colon. The prior polypectomy site in the ascending colon showed no residual polyp.  She denies any bright red blood per rectum or melena.  She has no significant change in bowel habits although her stools vary from constipation to loose to normal.  She denies abdominal pain or tenesmus.  She has had dyspepsia and dysphagia in spite of omeprazole. She denies odynophagia. Solid food seems to get stuck in her epigastrium.    Current Status:  Pt is seen today for follow-up after transfusion.  She feels she felt  better after transfusion.    Problem List Patient Active Problem List   Diagnosis Date Noted  . Iron deficiency [E61.1] 09/21/2018  . Anemia [D64.9] 08/03/2018  . Impaired glucose tolerance [R73.02] 06/21/2017  . Cough variant asthma  vs UACS from ACEi  [J45.991] 12/31/2016  . Essential hypertension [I10] 12/31/2016  . Morbid obesity due to excess calories ([place]) [E66.01] 12/31/2016  . History of colonic polyps [Z86.010]   . Benign neoplasm of descending colon [D12.4]   . Diverticulosis of colon without hemorrhage [K57.30] 01/28/2014  . 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 Nonspecific abnormal finding in stool contents [R19.5] 11/29/2013  . Colon cancer screening [Z12.11] 11/29/2013  . History of depression [Z86.59] 08/11/2012  . Chronic systolic CHF (congestive heart failure) ([place]) [I50.22]   . Systolic CHF, chronic ([place]) [I50.22]   . LBBB (left bundle branch block) [I44.7]   . Automatic implantable cardioverter-defibrillator in situ [Z95.810]   . Hypothyroidism [E03.9]   . GERD (gastroesophageal reflux disease) [K21.9]   . Dyslipidemia [E78.5]   . Mitral regurgitation [I34.0]   . Overweight [E66.3]   . Ejection fraction < 50% [R94.30]  Past Medical History Past Medical History:  Diagnosis Date  . Automatic implantable cardioverter-defibrillator in situ    Dr. Beckie Salts follows-next visit- [phone]  . Cataract   . Diverticulosis   . Dyslipidemia   . Ejection fraction < 50%    EF 25-30%, September, 2010  . GERD (gastroesophageal reflux disease)   . Hypothyroidism   . ICD (implantable cardiac defibrillator) in place    10/2009; Dr. Lovena Le  . Iron deficiency 09/21/2018  . LBBB (left bundle branch block)    old  . Macrocytic anemia   . Mitral regurgitation    Mild, echo, September, 2010  . Nonischemic cardiomyopathy ([place])    Normal coronary arteries, catheterization, September, 2009  . Overweight(278.02)   . Serrated polyp of colon   . Systolic CHF, chronic  ([facility])    no recent problems    Past Surgical History Past Surgical History:  Procedure Laterality Date  . BIOPSY  08/07/2018   Procedure: BIOPSY;  Surgeon: Jerene Bears, MD;  Location: [facility];  Service: Gastroenterology;;  . CARDIAC CATHETERIZATION     '09 last  . [place]    . COLONOSCOPY N/A 11/29/2013   Procedure: COLONOSCOPY;  Surgeon: Jerene Bears, MD;  Location: [facility];  Service: Gastroenterology;  Laterality: N/A;  . COLONOSCOPY N/A 02/25/2015   Procedure: COLONOSCOPY;  Surgeon: Jerene Bears, MD;  Location: [facility];  Service: Gastroenterology;  Laterality: N/A;  . ESOPHAGOGASTRODUODENOSCOPY (EGD) WITH PROPOFOL N/A 08/07/2018   Procedure: ESOPHAGOGASTRODUODENOSCOPY (EGD) WITH PROPOFOL;  Surgeon: Jerene Bears, MD;  Location: [facility];  Service: Gastroenterology;  Laterality: N/A;  . EYE SURGERY Left    x 3-"droopy eyelid"  . heart catherization    . TONSILLECTOMY  1952    Family History Family History  Problem Relation Age of Onset  . Heart attack Mother   . Heart disease Mother   . Hypertension Mother   . Lung cancer Father   . Pancreatic cancer Father   . Cancer Father   . Stroke Paternal Grandfather   . Colon cancer Neg Hx   . Diabetes Neg Hx   . Kidney disease Neg Hx   . Liver disease Neg Hx      Social History  reports that she has never smoked. She has never used smokeless tobacco. She reports current alcohol use. She reports that she does not use drugs.  Medications  Current Outpatient Medications:  .  albuterol (PROVENTIL HFA;VENTOLIN HFA) 108 (90 Base) MCG/ACT inhaler, Inhale 2 puffs into the lungs every 6 (six) hours as needed for wheezing or shortness of breath., Disp: 1 Inhaler, Rfl: prn .  carvedilol (COREG) 25 MG tablet, TAKE 1 TABLET BY MOUTH TWICE A DAY, Disp: 180 tablet, Rfl: 2 .  doxycycline (VIBRA-TABS) 100 MG tablet, Take 1 tablet (100 mg total) by mouth 2 (two) times daily., Disp: 20 tablet, Rfl:  0 .  folic acid (FOLVITE) 1 MG tablet, Take 1 tablet (1 mg total) by mouth daily., Disp: 90 tablet, Rfl: 0 .  furosemide (LASIX) 40 MG tablet, TAKE 1 TABLET BY MOUTH EVERY DAY, Disp: 90 tablet, Rfl: 2 .  glucose blood (ACCU-CHEK AVIVA) test strip, Check daily.  For Diabetes 250.00, Disp: 100 each, Rfl: 12 .  ipratropium-albuterol (DUONEB) 0.5-2.5 (3) MG/3ML SOLN, Take 3 mLs by nebulization every 4 (four) hours as needed (shortness of breath or wheezing)., Disp: , Rfl:  .  Lancets MISC, Check glucose daily.  Diabetes 250.00, Disp: 100 each, Rfl:  11 .  levothyroxine (SYNTHROID, LEVOTHROID) 150 MCG tablet, TAKE 1 TABLET BY MOUTH EVERY DAY, Disp: 90 tablet, Rfl: 0 .  losartan (COZAAR) 25 MG tablet, TAKE 1 TABLET BY MOUTH EVERY DAY, Disp: 90 tablet, Rfl: 2 .  pantoprazole (PROTONIX) 40 MG tablet, Take 1 tablet (40 mg total) by mouth daily., Disp: 90 tablet, Rfl: 3 .  Propylene Glycol (SYSTANE BALANCE) 0.6 % SOLN, Place 1 drop into both eyes daily as needed (for dry eyes)., Disp: , Rfl:   Allergies Patient has no known allergies.  Review of Systems Review of Systems - Oncology ROS negative   Physical Exam  Vitals Wt Readings from Last 3 Encounters:  12/04/18 192 lb 3.2 oz (87.2 kg)  11/24/18 194 lb (88 kg)  11/17/18 194 lb 11.2 oz (88.3 kg)   Temp Readings from Last 3 Encounters:  12/04/18 (!) 97.5 F (36.4 C) (Oral)  11/28/18 98.6 F (37 C) (Oral)  11/24/18 99.8 F (37.7 C)   BP Readings from Last 3 Encounters:  12/04/18 (!) 111/56  11/28/18 (!) 103/55  11/24/18 110/80   Pulse Readings from Last 3 Encounters:  12/04/18 80  11/28/18 76  11/24/18 64   Constitutional: Well-developed, well-nourished, and in no distress.   HENT: Head: Normocephalic and atraumatic.  Mouth/Throat: No oropharyngeal exudate. Mucosa moist. Eyes: Pupils are equal, round, and reactive to light. Conjunctivae are normal. No scleral icterus.  Neck: Normal range of motion. Neck supple. No JVD present.   Cardiovascular: Normal rate, regular rhythm and normal heart sounds.  Exam reveals no gallop and no friction rub.   No murmur heard. Pulmonary/Chest: Effort normal and breath sounds normal. No respiratory distress. No wheezes.No rales.  Abdominal: Soft. Bowel sounds are normal. No distension. There is no tenderness. There is no guarding.  Musculoskeletal: No edema or tenderness.  Lymphadenopathy: No cervical, axillary or supraclavicular adenopathy.  Neurological: Alert and oriented to person, place, and time. No cranial nerve deficit.  Skin: Skin is warm and dry. No rash noted. No erythema. No pallor.  Psychiatric: Affect and judgment normal.   Labs Appointment on 12/04/2018  Component Date Value Ref Range Status  . Vitamin B-12 12/04/2018 1,081* 180 - 914 pg/mL Final   Comment: (NOTE) This assay is not validated for testing neonatal or myeloproliferative syndrome specimens for Vitamin B12 levels. Performed at [facility], [place] [address] [place], [place] [address]   . LDH 12/04/2018 149  98 - 192 U/L Final   Performed at [facility], [place] [address] [place], [place] [address]  . Ferritin 12/04/2018 408* 11 - 307 ng/mL Final   Performed at [facility], [place] [address], [place],  [address]  . Sodium 12/04/2018 142  135 - 145 mmol/L Final  . Potassium 12/04/2018 3.5  3.5 - 5.1 mmol/L Final  . Chloride 12/04/2018 100  98 - 111 mmol/L Final  . CO2 12/04/2018 31  22 - 32 mmol/L Final  . Glucose, Bld 12/04/2018 107* 70 - 99 mg/dL Final  . BUN 12/04/2018 25* 8 - 23 mg/dL Final  . Creatinine, Ser 12/04/2018 0.86  0.44 - 1.00 mg/dL Final  . Calcium 12/04/2018 8.8* 8.9 - 10.3 mg/dL Final  . Total Protein 12/04/2018 6.4* 6.5 - 8.1 g/dL Final  . Albumin 12/04/2018 3.3* 3.5 - 5.0 g/dL Final  . AST 12/04/2018 10* 15 - 41 U/L Final  . ALT 12/04/2018 8  0 - 44 U/L Final  . Alkaline Phosphatase 12/04/2018  53  38 - 126  U/L Final  . Total Bilirubin 12/04/2018 0.9  0.3 - 1.2 mg/dL Final  . GFR calc non Af Amer 12/04/2018 >60  >60 mL/min Final  . GFR calc Af Amer 12/04/2018 >60  >60 mL/min Final  . Anion gap 12/04/2018 11  5 - 15 Final   Performed at [facility], [place] [address] [place], [place] [address]  . WBC 12/04/2018 3.5* 4.0 - 10.5 K/uL Final  . RBC 12/04/2018 2.40* 3.87 - 5.11 MIL/uL Final  . Hemoglobin 12/04/2018 8.3* 12.0 - 15.0 g/dL Final  . HCT 12/04/2018 25.5* 36.0 - 46.0 % Final  . MCV 12/04/2018 106.3* 80.0 - 100.0 fL Final  . MCH 12/04/2018 34.6* 26.0 - 34.0 pg Final  . MCHC 12/04/2018 32.5  30.0 - 36.0 g/dL Final  . RDW 12/04/2018 22.7* 11.5 - 15.5 % Final  . Platelets 12/04/2018 249  150 - 400 K/uL Final  . nRBC 12/04/2018 1.2* 0.0 - 0.2 % Final  . Neutrophils Relative % 12/04/2018 52  % Final  . Neutro Abs 12/04/2018 1.8  1.7 - 7.7 K/uL Final  . Lymphocytes Relative 12/04/2018 40  % Final  . Lymphs Abs 12/04/2018 1.4  0.7 - 4.0 K/uL Final  . Monocytes Relative 12/04/2018 6  % Final  . Monocytes Absolute 12/04/2018 0.2  0.1 - 1.0 K/uL Final  . Eosinophils Relative 12/04/2018 0  % Final  . Eosinophils Absolute 12/04/2018 0.0  0.0 - 0.5 K/uL Final  . Basophils Relative 12/04/2018 1  % Final  . Basophils Absolute 12/04/2018 0.0  0.0 - 0.1 K/uL Final  . Immature Granulocytes 12/04/2018 1  % Final  . Abs Immature Granulocytes 12/04/2018 0.02  0.00 - 0.07 K/uL Final   Performed at [facility], [place] [address], [place], [place] [address]     Pathology Orders Placed This Encounter  Procedures  . CT Biopsy    Standing Status:   Future    Standing Expiration Date:   12/04/2019    Order Specific Question:   Lab orders requested (DO NOT place separate lab orders, these will be automatically ordered during procedure specimen collection):    Answer:   Surgical Pathology    Comments:   flow cytometry, cytogenetics, MDS fish panel    Order  Specific Question:   Reason for Exam (SYMPTOM  OR DIAGNOSIS REQUIRED)    Answer:   macrocytic anemia    Order Specific Question:   Preferred imaging location?    Answer:   [facility]    Order Specific Question:   Radiology Contrast Protocol - do NOT remove file path    Answer:   \\[email or web address]  . CT BONE MARROW BIOPSY & ASPIRATION    Standing Status:   Future    Standing Expiration Date:   03/05/2020    Order Specific Question:   Reason for Exam (SYMPTOM  OR DIAGNOSIS REQUIRED)    Answer:   macrocytic anemia    Order Specific Question:   Preferred imaging location?    Answer:   [facility]    Order Specific Question:   Radiology Contrast Protocol - do NOT remove file path    Answer:   \\[email or web address]  . CBC with Differential ([place] Only)    Standing Status:   Future    Standing Expiration Date:   12/04/2019  . CMP ([place] only)    Standing Status:   Future  Standing Expiration Date:   12/04/2019  . Lactate dehydrogenase (LDH)    Standing Status:   Future    Standing Expiration Date:   12/04/2019       Zoila Shutter MD

## 2018-12-04 NOTE — Telephone Encounter (Signed)
Scheduled appt per 3/23 los. ° °Printed calendar and avs. °

## 2018-12-04 NOTE — Progress Notes (Signed)
Thank you :)

## 2018-12-05 LAB — PROTEIN ELECTROPHORESIS, SERUM
A/G Ratio: 1.3 (ref 0.7–1.7)
Albumin ELP: 3.2 g/dL (ref 2.9–4.4)
Alpha-1-Globulin: 0.2 g/dL (ref 0.0–0.4)
Alpha-2-Globulin: 0.5 g/dL (ref 0.4–1.0)
Beta Globulin: 0.7 g/dL (ref 0.7–1.3)
Gamma Globulin: 1 g/dL (ref 0.4–1.8)
Globulin, Total: 2.5 g/dL (ref 2.2–3.9)
Total Protein ELP: 5.7 g/dL — ABNORMAL LOW (ref 6.0–8.5)

## 2018-12-05 LAB — HAPTOGLOBIN: Haptoglobin: 93 mg/dL (ref 42–346)

## 2018-12-06 ENCOUNTER — Other Ambulatory Visit: Payer: Self-pay | Admitting: Internal Medicine

## 2018-12-06 LAB — METHYLMALONIC ACID, SERUM: Methylmalonic Acid, Quantitative: 155 nmol/L (ref 0–378)

## 2018-12-07 LAB — HEMOGLOBINOPATHY EVALUATION
HGB F QUANT: 3.3 % — AB (ref 0.0–2.0)
HGB S QUANTITAION: 0 %
HGB VARIANT: 0 %
Hgb A2 Quant: 1.7 % — ABNORMAL LOW (ref 1.8–3.2)
Hgb A: 95 % — ABNORMAL LOW (ref 96.4–98.8)
Hgb C: 0 %

## 2018-12-14 ENCOUNTER — Other Ambulatory Visit: Payer: Self-pay | Admitting: Physician Assistant

## 2018-12-14 ENCOUNTER — Ambulatory Visit: Payer: Medicare Other | Admitting: Internal Medicine

## 2018-12-14 ENCOUNTER — Ambulatory Visit (HOSPITAL_COMMUNITY): Payer: Medicare Other

## 2018-12-14 ENCOUNTER — Ambulatory Visit (HOSPITAL_COMMUNITY): Admission: RE | Admit: 2018-12-14 | Payer: Medicare Other | Source: Ambulatory Visit

## 2018-12-15 ENCOUNTER — Ambulatory Visit (HOSPITAL_COMMUNITY)
Admission: RE | Admit: 2018-12-15 | Discharge: 2018-12-15 | Disposition: A | Discharge status: Home / Self Care | Payer: Medicare Other | Source: Ambulatory Visit | Attending: Internal Medicine | Admitting: Internal Medicine

## 2018-12-15 ENCOUNTER — Telehealth: Payer: Self-pay | Admitting: Internal Medicine

## 2018-12-15 ENCOUNTER — Ambulatory Visit (HOSPITAL_COMMUNITY)
Admission: RE | Admit: 2018-12-15 | Discharge: 2018-12-15 | Disposition: A | Discharge status: Home / Self Care | Payer: Medicare Other | Source: Ambulatory Visit

## 2018-12-15 ENCOUNTER — Other Ambulatory Visit: Payer: Self-pay

## 2018-12-15 ENCOUNTER — Encounter (HOSPITAL_COMMUNITY): Payer: Self-pay

## 2018-12-15 DIAGNOSIS — D539 Nutritional anemia, unspecified: Secondary | ICD-10-CM | POA: Diagnosis not present

## 2018-12-15 DIAGNOSIS — Z800 Family history of malignant neoplasm of digestive organs: Secondary | ICD-10-CM | POA: Insufficient documentation

## 2018-12-15 DIAGNOSIS — I5022 Chronic systolic (congestive) heart failure: Secondary | ICD-10-CM | POA: Diagnosis present

## 2018-12-15 DIAGNOSIS — E663 Overweight: Secondary | ICD-10-CM | POA: Diagnosis not present

## 2018-12-15 DIAGNOSIS — Z79890 Hormone replacement therapy (postmenopausal): Secondary | ICD-10-CM | POA: Diagnosis not present

## 2018-12-15 DIAGNOSIS — Z79899 Other long term (current) drug therapy: Secondary | ICD-10-CM | POA: Diagnosis not present

## 2018-12-15 DIAGNOSIS — Z95810 Presence of automatic (implantable) cardiac defibrillator: Secondary | ICD-10-CM | POA: Insufficient documentation

## 2018-12-15 DIAGNOSIS — E039 Hypothyroidism, unspecified: Secondary | ICD-10-CM | POA: Insufficient documentation

## 2018-12-15 DIAGNOSIS — D531 Other megaloblastic anemias, not elsewhere classified: Secondary | ICD-10-CM | POA: Diagnosis not present

## 2018-12-15 DIAGNOSIS — D520 Dietary folate deficiency anemia: Secondary | ICD-10-CM

## 2018-12-15 DIAGNOSIS — I428 Other cardiomyopathies: Secondary | ICD-10-CM | POA: Insufficient documentation

## 2018-12-15 DIAGNOSIS — Z801 Family history of malignant neoplasm of trachea, bronchus and lung: Secondary | ICD-10-CM | POA: Insufficient documentation

## 2018-12-15 DIAGNOSIS — D509 Iron deficiency anemia, unspecified: Secondary | ICD-10-CM | POA: Diagnosis not present

## 2018-12-15 DIAGNOSIS — K219 Gastro-esophageal reflux disease without esophagitis: Secondary | ICD-10-CM | POA: Insufficient documentation

## 2018-12-15 DIAGNOSIS — Z8249 Family history of ischemic heart disease and other diseases of the circulatory system: Secondary | ICD-10-CM | POA: Diagnosis not present

## 2018-12-15 DIAGNOSIS — D7589 Other specified diseases of blood and blood-forming organs: Secondary | ICD-10-CM | POA: Diagnosis not present

## 2018-12-15 LAB — CBC WITH DIFFERENTIAL/PLATELET
Abs Immature Granulocytes: 0.01 10*3/uL (ref 0.00–0.07)
Basophils Absolute: 0 10*3/uL (ref 0.0–0.1)
Basophils Relative: 1 %
Eosinophils Absolute: 0 10*3/uL (ref 0.0–0.5)
Eosinophils Relative: 1 %
HCT: 28.1 % — ABNORMAL LOW (ref 36.0–46.0)
Hemoglobin: 9 g/dL — ABNORMAL LOW (ref 12.0–15.0)
Immature Granulocytes: 0 %
Lymphocytes Relative: 37 %
Lymphs Abs: 1.5 10*3/uL (ref 0.7–4.0)
MCH: 35.2 pg — ABNORMAL HIGH (ref 26.0–34.0)
MCHC: 32 g/dL (ref 30.0–36.0)
MCV: 109.8 fL — ABNORMAL HIGH (ref 80.0–100.0)
Monocytes Absolute: 0.2 10*3/uL (ref 0.1–1.0)
Monocytes Relative: 4 %
Neutro Abs: 2.3 10*3/uL (ref 1.7–7.7)
Neutrophils Relative %: 57 %
Platelets: 257 10*3/uL (ref 150–400)
RBC: 2.56 MIL/uL — ABNORMAL LOW (ref 3.87–5.11)
RDW: 23.6 % — ABNORMAL HIGH (ref 11.5–15.5)
WBC: 4.1 10*3/uL (ref 4.0–10.5)
nRBC: 0.7 % — ABNORMAL HIGH (ref 0.0–0.2)

## 2018-12-15 LAB — PROTIME-INR
INR: 1.1 (ref 0.8–1.2)
Prothrombin Time: 14.3 seconds (ref 11.4–15.2)

## 2018-12-15 LAB — GLUCOSE, CAPILLARY: Glucose-Capillary: 105 mg/dL — ABNORMAL HIGH (ref 70–99)

## 2018-12-15 MED ORDER — MIDAZOLAM HCL 2 MG/2ML IJ SOLN
INTRAMUSCULAR | Status: AC | PRN
  Administered 2018-12-15: 1 mg via INTRAVENOUS
  Administered 2018-12-15: 0.5 mg via INTRAVENOUS

## 2018-12-15 MED ORDER — MIDAZOLAM HCL 2 MG/2ML IJ SOLN
INTRAMUSCULAR | Status: AC
  Filled 2018-12-15: qty 4

## 2018-12-15 MED ORDER — SODIUM CHLORIDE 0.9 % IV SOLN
INTRAVENOUS | Status: DC
  Administered 2018-12-15: 10:00:00 via INTRAVENOUS

## 2018-12-15 MED ORDER — FENTANYL CITRATE (PF) 100 MCG/2ML IJ SOLN
INTRAMUSCULAR | Status: AC | PRN
  Administered 2018-12-15: 50 ug via INTRAVENOUS
  Administered 2018-12-15: 12.5 ug via INTRAVENOUS

## 2018-12-15 MED ORDER — FENTANYL CITRATE (PF) 100 MCG/2ML IJ SOLN
INTRAMUSCULAR | Status: AC
  Filled 2018-12-15: qty 4

## 2018-12-15 NOTE — H&P (Signed)
Chief Complaint: Patient was seen in consultation today for bone marrow biopsy.  Referring Physician(s): Higgs,Vetta  Supervising Physician: Aletta Edouard  Patient Status: [facility] - Out-pt  History of Present Illness: [name] is a 73 y.o. female with a past medical history significant for hypothyroidism, GERD, diverticulosis, HLD, mitral regurgitation, LBBB s/p ICD placement (2011), CHF, cardiomyopathy, iron deficiency and macrocytic anemia followed by Dr. Walden Field who presents today for a bone marrow biopsy. During her yearly visit with her PCP on 05/18/2018 she was noted to have macrocytic anemia and was given hemoccult cards which were found to be negative. She was referred to GI for further evaluation. She was seen by Dr. Hilarie Fredrickson on 07/14/18 who did not see any evidence of GI blood loss and as such she was referred to hematology for further evaluation. She was seen by Dr. Audelia Hives on 08/03/18 and further lab studies were obtained. She was then seen by Dr. Walden Field on 09/21/18 and started on IV iron therapy, unfortunately lab work post transfusion showed that she continued to have significant anemia. Request has been made to IR for bone marrow biopsy to further evaluate anemia.   [name] denies any complaints today, her only concern is that she is able to undergo the procedure in CT due to her the presence of her pacemaker. She states understanding of procedure and wishes to proceed.   Past Medical History:  Diagnosis Date  . Automatic implantable cardioverter-defibrillator in situ    Dr. Beckie Salts follows-next visit- [phone]  . Cataract   . Diverticulosis   . Dyslipidemia   . Ejection fraction < 50%    EF 25-30%, September, 2010  . GERD (gastroesophageal reflux disease)   . Hypothyroidism   . ICD (implantable cardiac defibrillator) in place    10/2009; Dr. Lovena Le  . Iron deficiency 09/21/2018  . LBBB (left bundle branch block)    old  . Macrocytic anemia   . Mitral regurgitation    Mild, echo, September, 2010  . Nonischemic cardiomyopathy ([place])    Normal coronary arteries, catheterization, September, 2009  . Overweight(278.02)   . Serrated polyp of colon   . Systolic CHF, chronic (HCC)    no recent problems    Past Surgical History:  Procedure Laterality Date  . BIOPSY  08/07/2018   Procedure: BIOPSY;  Surgeon: Jerene Bears, MD;  Location: [facility];  Service: Gastroenterology;;  . CARDIAC CATHETERIZATION     '09 last  . [place]    . COLONOSCOPY N/A 11/29/2013   Procedure: COLONOSCOPY;  Surgeon: Jerene Bears, MD;  Location: [place];  Service: Gastroenterology;  Laterality: N/A;  . COLONOSCOPY N/A 02/25/2015   Procedure: COLONOSCOPY;  Surgeon: Jerene Bears, MD;  Location: [facility];  Service: Gastroenterology;  Laterality: N/A;  . ESOPHAGOGASTRODUODENOSCOPY (EGD) WITH PROPOFOL N/A 08/07/2018   Procedure: ESOPHAGOGASTRODUODENOSCOPY (EGD) WITH PROPOFOL;  Surgeon: Jerene Bears, MD;  Location: [facility];  Service: Gastroenterology;  Laterality: N/A;  . EYE SURGERY Left    x 3-"droopy eyelid"  . heart catherization    . TONSILLECTOMY  1952    Allergies: Patient has no known allergies.  Medications: Prior to Admission medications   Medication Sig Start Date End Date Taking? Authorizing Provider  albuterol (PROVENTIL HFA;VENTOLIN HFA) 108 (90 Base) MCG/ACT inhaler Inhale 2 puffs into the lungs every 6 (six) hours as needed for wheezing or shortness of breath. 11/23/16   Elby Showers, MD  carvedilol (COREG) 25 MG tablet TAKE  1 TABLET BY MOUTH TWICE A DAY 04/27/18   Nahser, Wonda Cheng, MD  doxycycline (VIBRA-TABS) 100 MG tablet Take 1 tablet (100 mg total) by mouth 2 (two) times daily. 11/24/18   Elby Showers, MD  folic acid (FOLVITE) 1 MG tablet Take 1 tablet (1 mg total) by mouth daily. 11/24/18   Elby Showers, MD  furosemide (LASIX) 40 MG tablet TAKE 1 TABLET BY MOUTH EVERY DAY 04/27/18   Nahser, Wonda Cheng, MD  glucose blood  (ACCU-CHEK AVIVA) test strip Check daily.  For Diabetes 250.00 05/29/14   Elby Showers, MD  ipratropium-albuterol (DUONEB) 0.5-2.5 (3) MG/3ML SOLN Take 3 mLs by nebulization every 4 (four) hours as needed (shortness of breath or wheezing).    [provider]  Lancets MISC Check glucose daily.  Diabetes 250.00 05/29/14   Elby Showers, MD  levothyroxine (SYNTHROID, LEVOTHROID) 150 MCG tablet TAKE 1 TABLET BY MOUTH EVERY DAY 12/06/18   Elby Showers, MD  losartan (COZAAR) 25 MG tablet TAKE 1 TABLET BY MOUTH EVERY DAY 04/28/18   Evans Lance, MD  pantoprazole (PROTONIX) 40 MG tablet Take 1 tablet (40 mg total) by mouth daily. 10/09/18   Pyrtle, Lajuan Lines, MD  Propylene Glycol (SYSTANE BALANCE) 0.6 % SOLN Place 1 drop into both eyes daily as needed (for dry eyes).    [provider]     Family History  Problem Relation Age of Onset  . Heart attack Mother   . Heart disease Mother   . Hypertension Mother   . Lung cancer Father   . Pancreatic cancer Father   . Cancer Father   . Stroke Paternal Grandfather   . Colon cancer Neg Hx   . Diabetes Neg Hx   . Kidney disease Neg Hx   . Liver disease Neg Hx     Social History   Socioeconomic History  . Marital status: Single    Spouse name: Not on file  . Number of children: Not on file  . Years of education: Not on file  . Highest education level: Not on file  Occupational History  . Not on file  Social Needs  . Financial resource strain: Not on file  . Food insecurity:    Worry: Not on file    Inability: Not on file  . Transportation needs:    Medical: Not on file    Non-medical: Not on file  Tobacco Use  . Smoking status: Never Smoker  . Smokeless tobacco: Never Used  Substance and Sexual Activity  . Alcohol use: Yes    Comment: 2-4 beers per week  . Drug use: No  . Sexual activity: Never    Birth control/protection: Abstinence  Lifestyle  . Physical activity:    Days per week: Not on file    Minutes per  session: Not on file  . Stress: Not on file  Relationships  . Social connections:    Talks on phone: Not on file    Gets together: Not on file    Attends religious service: Not on file    Active member of club or organization: Not on file    Attends meetings of clubs or organizations: Not on file    Relationship status: Not on file  Other Topics Concern  . Not on file  Social History Narrative  . Not on file     Review of Systems: A 12 point ROS discussed and pertinent positives are indicated in the HPI above.  All other systems are negative.  Review of Systems  Constitutional: Negative for chills and fever.  Respiratory: Negative for cough and shortness of breath.   Cardiovascular: Negative for chest pain.  Gastrointestinal: Negative for abdominal pain, blood in stool, diarrhea, nausea and vomiting.  Genitourinary: Negative for dysuria and hematuria.  Musculoskeletal: Negative for back pain.  Skin: Negative for rash and wound.  Neurological: Negative for dizziness, syncope and headaches.    Vital Signs: BP (!) 115/49 (BP Location: Right Arm)   Pulse 78   Temp 97.9 F (36.6 C) (Oral)   Resp 18   SpO2 96%   Physical Exam Vitals signs reviewed.  Constitutional:      General: She is not in acute distress. HENT:     Head: Normocephalic.  Cardiovascular:     Rate and Rhythm: Normal rate and regular rhythm.     Comments: (+) left chest ICD Pulmonary:     Effort: Pulmonary effort is normal.     Breath sounds: Normal breath sounds.  Abdominal:     General: There is no distension.     Palpations: Abdomen is soft.     Tenderness: There is no abdominal tenderness.  Skin:    General: Skin is warm and dry.  Neurological:     Mental Status: She is alert and oriented to person, place, and time.  Psychiatric:        Mood and Affect: Mood normal.        Behavior: Behavior normal.        Thought Content: Thought content normal.        Judgment: Judgment normal.       MD Evaluation Airway: WNL Heart: WNL Abdomen: WNL Chest/ Lungs: WNL ASA  Classification: 3 Mallampati/Airway Score: One   Imaging: No results found.  Labs:  CBC: Recent Labs    09/21/18 0819 11/02/18 1242 11/24/18 1648 12/04/18 0753  WBC 4.2 4.0 5.8 3.5*  HGB 9.9* 8.7* 7.2* 8.3*  HCT 30.8* 27.2* 21.1* 25.5*  PLT 290 290 264 249    COAGS: No results for input(s): INR, APTT in the last 8760 hours.  BMP: Recent Labs    08/29/18 1220 09/21/18 0819 11/02/18 1242 12/04/18 0753  NA 143 142 144 142  K 3.6 3.5 3.4* 3.5  CL 100 101 100 100  CO2 33* 33* 34* 31  GLUCOSE 90 111* 95 107*  BUN _0 25*  CALCIUM 9.3 9.2 9.1 8.8*  CREATININE 0.86 0.84 0.83 0.86  GFRNONAA >60 >60 >60 >60  GFRAA >60 >60 >60 >60    LIVER FUNCTION TESTS: Recent Labs    08/29/18 1220 09/21/18 0819 11/02/18 1242 12/04/18 0753  BILITOT 1.0 1.1 1.0 0.9  AST 11* 10* 10* 10*  ALT _1 ALKPHOS 78 73 69 53  PROT 6.8 6.9 6.9 6.4*  ALBUMIN 3.6 3.6 4.0 3.3*    TUMOR MARKERS: No results for input(s): AFPTM, CEA, CA199, CHROMGRNA in the last 8760 hours.  Assessment and Plan:  73 y/o F with macrocytic anemia of unknown etiology not responsive to treatment thus far followed by Dr. Walden Field who presents today for bone marrow biopsy to further evaluate cause of anemia.  Patient has been NPO since 7 pm last night, she does not take blood thinning medications, she took all of her morning medications at 430 am with a few sips of water. Afebrile, WBC 4.1, hgb 9.0, plt 257, INR 1.1.  Risks and benefits of bone marrow biospy  was discussed with the patient and/or patient's family including, but not limited to bleeding, infection, damage to adjacent structures or low yield requiring additional tests.  All of the questions were answered and there is agreement to proceed.  Consent signed and in chart.  Thank you for this interesting consult.  I greatly enjoyed meeting RONIT CRANFIELD and look  forward to participating in their care.  A copy of this report was sent to the requesting provider on this date.  Electronically Signed: Joaquim Nam, PA-C 12/15/2018, 9:06 AM   I spent a total of30 Minutes   in face to face in clinical consultation, greater than 50% of which was counseling/coordinating care for bone marrow biopsy.

## 2018-12-15 NOTE — Procedures (Signed)
Interventional Radiology Procedure Note  Procedure: CT guided bone marrow aspiration and biopsy  Complications: None  EBL: < 10 mL  Findings: Aspirate and core biopsy performed of bone marrow in right iliac bone.  Plan: Bedrest supine x 1 hrs  [name], M.D Pager:  [phone]   

## 2018-12-15 NOTE — Telephone Encounter (Signed)
R/s appt per sch message - left message for patient with updated appt date and time

## 2018-12-15 NOTE — Discharge Instructions (Signed)
Moderate Conscious Sedation, Adult, Care After °These instructions provide you with information about caring for yourself after your procedure. Your health care provider may also give you more specific instructions. Your treatment has been planned according to current medical practices, but problems sometimes occur. Call your health care provider if you have any problems or questions after your procedure. °What can I expect after the procedure? °After your procedure, it is common: °· To feel sleepy for several hours. °· To feel clumsy and have poor balance for several hours. °· To have poor judgment for several hours. °· To vomit if you eat too soon. °Follow these instructions at home: °For at least 24 hours after the procedure: ° °· Do not: °? Participate in activities where you could fall or become injured. °? Drive. °? Use heavy machinery. °? Drink alcohol. °? Take sleeping pills or medicines that cause drowsiness. °? Make important decisions or sign legal documents. °? Take care of children on your own. °· Rest. °Eating and drinking °· Follow the diet recommended by your health care provider. °· If you vomit: °? Drink water, juice, or soup when you can drink without vomiting. °? Make sure you have little or no nausea before eating solid foods. °General instructions °· Have a responsible adult stay with you until you are awake and alert. °· Take over-the-counter and prescription medicines only as told by your health care provider. °· If you smoke, do not smoke without supervision. °· Keep all follow-up visits as told by your health care provider. This is important. °Contact a health care provider if: °· You keep feeling nauseous or you keep vomiting. °· You feel light-headed. °· You develop a rash. °· You have a fever. °Get help right away if: °· You have trouble breathing. °This information is not intended to replace advice given to you by your health care provider. Make sure you discuss any questions you have  with your health care provider. °Document Released: 06/20/2013 Document Revised: 02/02/2016 Document Reviewed: 12/20/2015 °Elsevier Interactive Patient Education © 2019 [employer] ° ° ° °You may shower and remove your dressing tomorrow. ° °Bone Marrow Aspiration and Bone Marrow Biopsy, Adult, Care After °This sheet gives you information about how to care for yourself after your procedure. Your health care provider may also give you more specific instructions. If you have problems or questions, contact your health care provider. °What can I expect after the procedure? °After the procedure, it is common to have: °· Mild pain and tenderness. °· Swelling. °· Bruising. °Follow these instructions at home: °Puncture site care ° °  ° °· Follow instructions from your health care provider about how to take care of the puncture site. Make sure you: °? Wash your hands with soap and water before you change your bandage (dressing). If soap and water are not available, use hand sanitizer. °? Change your dressing as told by your health care provider. °· Check your puncture site every day for signs of infection. Check for: °? More redness, swelling, or pain. °? More fluid or blood. °? Warmth. °? Pus or a bad smell. °General instructions °· Take over-the-counter and prescription medicines only as told by your health care provider. °· Do not take baths, swim, or use a hot tub until your health care provider approves. Ask if you can take a shower or have a sponge bath. °· Return to your normal activities as told by your health care provider. Ask your health care provider what activities are safe for   you. °· Do not drive for 24 hours if you were given a medicine to help you relax (sedative) during your procedure. °· Keep all follow-up visits as told by your health care provider. This is important. °Contact a health care provider if: °· Your pain is not controlled with medicine. °Get help right away if: °· You have a fever. °· You  have more redness, swelling, or pain around the puncture site. °· You have more fluid or blood coming from the puncture site. °· Your puncture site feels warm to the touch. °· You have pus or a bad smell coming from the puncture site. °These symptoms may represent a serious problem that is an emergency. Do not wait to see if the symptoms will go away. Get medical help right away. Call your local emergency services (911 in the [place] Do not drive yourself to the hospital. °Summary °· After the procedure, it is common to have mild pain, tenderness, swelling, and bruising. °· Follow instructions from your health care provider about how to take care of the puncture site. °· Get help right away if you have any symptoms of infection or if you have more blood or fluid coming from the puncture site. °This information is not intended to replace advice given to you by your health care provider. Make sure you discuss any questions you have with your health care provider. °Document Released: 03/19/2005 Document Revised: 12/13/2017 Document Reviewed: 02/11/2016 °Elsevier Interactive Patient Education © 2019 [employer] ° °

## 2018-12-15 NOTE — Telephone Encounter (Signed)
Patient called and confirmed 4/15 lab/fu.

## 2018-12-15 NOTE — Telephone Encounter (Signed)
[facility] out of office - moved 4/6 lab/fu to 4/9. Left message for patient.

## 2018-12-18 ENCOUNTER — Inpatient Hospital Stay: Payer: Medicare Other

## 2018-12-18 ENCOUNTER — Inpatient Hospital Stay: Payer: Medicare Other | Admitting: Internal Medicine

## 2018-12-20 ENCOUNTER — Encounter (HOSPITAL_COMMUNITY): Payer: Self-pay | Admitting: Internal Medicine

## 2018-12-21 ENCOUNTER — Other Ambulatory Visit: Payer: Medicare Other

## 2018-12-21 ENCOUNTER — Ambulatory Visit: Payer: Medicare Other | Admitting: Internal Medicine

## 2018-12-27 ENCOUNTER — Encounter: Payer: Self-pay | Admitting: Internal Medicine

## 2018-12-27 ENCOUNTER — Other Ambulatory Visit: Payer: Self-pay

## 2018-12-27 ENCOUNTER — Other Ambulatory Visit: Payer: Self-pay | Admitting: Internal Medicine

## 2018-12-27 ENCOUNTER — Inpatient Hospital Stay (HOSPITAL_BASED_OUTPATIENT_CLINIC_OR_DEPARTMENT_OTHER): Payer: Medicare Other | Admitting: Internal Medicine

## 2018-12-27 ENCOUNTER — Inpatient Hospital Stay: Payer: Medicare Other | Attending: Hematology and Oncology

## 2018-12-27 DIAGNOSIS — Z5111 Encounter for antineoplastic chemotherapy: Secondary | ICD-10-CM | POA: Insufficient documentation

## 2018-12-27 DIAGNOSIS — D469 Myelodysplastic syndrome, unspecified: Secondary | ICD-10-CM | POA: Insufficient documentation

## 2018-12-27 DIAGNOSIS — I10 Essential (primary) hypertension: Secondary | ICD-10-CM

## 2018-12-27 DIAGNOSIS — Z800 Family history of malignant neoplasm of digestive organs: Secondary | ICD-10-CM | POA: Insufficient documentation

## 2018-12-27 DIAGNOSIS — D649 Anemia, unspecified: Secondary | ICD-10-CM

## 2018-12-27 DIAGNOSIS — I429 Cardiomyopathy, unspecified: Secondary | ICD-10-CM | POA: Diagnosis not present

## 2018-12-27 DIAGNOSIS — D520 Dietary folate deficiency anemia: Secondary | ICD-10-CM

## 2018-12-27 DIAGNOSIS — E039 Hypothyroidism, unspecified: Secondary | ICD-10-CM | POA: Insufficient documentation

## 2018-12-27 HISTORY — DX: Myelodysplastic syndrome, unspecified: D46.9

## 2018-12-27 LAB — CMP (CANCER CENTER ONLY)
ALT: 9 U/L (ref 0–44)
AST: 10 U/L — ABNORMAL LOW (ref 15–41)
Albumin: 3.7 g/dL (ref 3.5–5.0)
Alkaline Phosphatase: 61 U/L (ref 38–126)
Anion gap: 11 (ref 5–15)
BUN: 17 mg/dL (ref 8–23)
CO2: 31 mmol/L (ref 22–32)
Calcium: 9.1 mg/dL (ref 8.9–10.3)
Chloride: 101 mmol/L (ref 98–111)
Creatinine: 0.83 mg/dL (ref 0.44–1.00)
GFR, Est AFR Am: >60 mL/min (ref >60)
GFR, Estimated: >60 mL/min (ref >60)
Glucose, Bld: 111 mg/dL — ABNORMAL HIGH (ref 70–99)
Potassium: 3.5 mmol/L (ref 3.5–5.1)
Sodium: 143 mmol/L (ref 135–145)
Total Bilirubin: 1.4 mg/dL — ABNORMAL HIGH (ref 0.3–1.2)
Total Protein: 6.7 g/dL (ref 6.5–8.1)

## 2018-12-27 LAB — CBC WITH DIFFERENTIAL (CANCER CENTER ONLY)
Abs Immature Granulocytes: 0.04 10*3/uL (ref 0.00–0.07)
Basophils Absolute: 0 10*3/uL (ref 0.0–0.1)
Basophils Relative: 0 %
Eosinophils Absolute: 0 10*3/uL (ref 0.0–0.5)
Eosinophils Relative: 1 %
HCT: 24.1 % — ABNORMAL LOW (ref 36.0–46.0)
Hemoglobin: 7.9 g/dL — ABNORMAL LOW (ref 12.0–15.0)
Immature Granulocytes: 1 %
Lymphocytes Relative: 27 %
Lymphs Abs: 1.4 10*3/uL (ref 0.7–4.0)
MCH: 35.4 pg — ABNORMAL HIGH (ref 26.0–34.0)
MCHC: 32.8 g/dL (ref 30.0–36.0)
MCV: 108.1 fL — ABNORMAL HIGH (ref 80.0–100.0)
Monocytes Absolute: 0.3 10*3/uL (ref 0.1–1.0)
Monocytes Relative: 6 %
Neutro Abs: 3.4 10*3/uL (ref 1.7–7.7)
Neutrophils Relative %: 65 %
Platelet Count: 234 10*3/uL (ref 150–400)
RBC: 2.23 MIL/uL — ABNORMAL LOW (ref 3.87–5.11)
RDW: 23.7 % — ABNORMAL HIGH (ref 11.5–15.5)
WBC Count: 5.1 10*3/uL (ref 4.0–10.5)
nRBC: 1.4 % — ABNORMAL HIGH (ref 0.0–0.2)

## 2018-12-27 LAB — LACTATE DEHYDROGENASE: LDH: 125 U/L (ref 98–192)

## 2018-12-27 NOTE — Progress Notes (Signed)
Diagnosis MDS (myelodysplastic syndrome) ([place])  Staging Cancer Staging No matching staging information was found for the patient.  Assessment and Plan:    1.  Myelodysplastic syndrome, Excess blasts.  73 year old previously followed by Dr. Audelia Hives.  Pt has past medical history significant for dyslipidemia; compensated systolic congestive heart failure; placement of a defibrillator in 2011; nonischemic cardiomyopathy; bilateral nonsurgical cataracts; hypothyroid disease; mitral regurgitation; elevated BMI; diverticular disease; gastroesophageal reflux disease with dysphagia; and a serrated sessile polyp (2) identified initially 3 years earlier. Her primary care physician is Dr. Tedra Senegal.  Her gastroenterologist is Dr. Zenovia Jarred.  Over the past 6 months, she was aware that she was anemic.   Review of her laboratory studies from May 01, 2015 show hemoglobin 11.6 hematocrit 33.3 WBC 3.8; platelets 307,000.  On December 05, 2015: A complete blood count showed hemoglobin 11.1 hematocrit 33.4 WBC 5.6; platelets 339,000.  On May 03, 2017: A complete blood count showed hemoglobin 10.9 hematocrit 33.9 MCV 103.7 MCH 33.3 RDW 17.4 WBC 5.0 with 47% neutrophils 43% lymphocytes 7% monocytes 2% eosinophils 1% basophil; platelets 349,000.  More recently, on May 12, 2018: A complete blood count showed hemoglobin 9.7 hematocrit 29.0 MCV 101.4 MCH 33.9 RDW 17.3 WBC 4.2 with 47% neutrophils 43% lymphocytes 7% monocytes 2% eosinophils 1% basophil; platelets 333,000.  On May 25, 2018: A complete blood count showed hemoglobin 9.6 hematocrit 28.8 MCV 101.4 MCH 33.8 RDW 17.9 WBC 3.8 with 39% neutrophils 51% lymphocytes 7% monocytes 2% eosinophils 1% basophil; platelets 338,000.  She  had in the past 2 colonoscopies.  In 2015 a Cologuard was positive.  A serrated sessile polyp was removed from the ascending colon at that time.  A follow-up colonoscopy one year later on February 25, 2015 revealed a second 5 mm sessile  serrated polyp in the descending colon. The prior polypectomy site in the ascending colon showed no residual polyp.  She denies any bright red blood per rectum or melena.  She has no significant change in bowel habits although her stools vary from constipation to loose to normal.  She denies abdominal pain or tenesmus.  She has had dyspepsia and dysphagia in spite of omeprazole. She denies odynophagia. Solid food seems to get stuck in her epigastrium.   Pt was treated with IV iron 09/22/2018 and 09/29/2018.    Labs done 11/02/2018 showed WBC 4 HB 8.7 plts 290,000.  Chemistries WNL with K+ 3.4 Cr 0.83 normal LFTs.  Ferritin improved at 289.   Labs done 11/24/2018 showed WBC 5.8 Hb 7.2 plts 264,000.  Pt was transfused 11/28/2018.    Labs done  12/04/2018 showed WBC 3.5 HB 8.3 plts 249,000.  Chemistries WNL with K+ 3.5 Cr 0.86 and normal LFTs.  Ferritin 408 B12 1081.    HB remained decreased at 8.3 despite IV iron and blood transfusion. She is on folic acid and oral J19 supplements. Pt was recommended for bone marrow aspirate and biopsy for further evaluation due to persistent macrocytosis and anemia to rule out MDS.     Bone marrow biopsy done 12/15/2018 reviewed and showed  Bone Marrow, Aspirate,Biopsy, and Clot, right iliac BONE MARROW: - HYPERCELLULAR MARROW WITH MULTILINEAGE DYSPLASIA, RING SIDEROBLASTS AND INCREASED BLASTS (5-9% BY CD34 IMMUNOHISTOCHEMISTRY) - SEE COMMENT PERIPHERAL BLOOD: - MACROCYTIC ANEMIA - SEE COMPLETE BLOOD COUNT Diagnosis Note The overall features in the marrow are consistent with a myelodysplastic syndrome with excess blasts-1 (MDS-EB-1); correlation with cytogenetics and FISH (MDS panel) is recommended.  Cytogenetics were normal.  Long talk held with pt today.  I discussed with her diagnosis of MDS.  Based on IPSS scoring system she would be intermediate to high risk due to % of blasts (5-9%) and anemia.  Usually in higher risk pts allogeneic SCT offers the greatest  potential for long-term disease control, but may cause substantial toxicity. Hypomethylating agents are associated with less toxicity than either transplantation or intensive chemotherapy and can provide symptomatic improvement but do not offer the possibility of cure.  Based on her significant cardiomyopathy with recent ECHO done 07/17/2018 showing EF of 25-30%, she would not likely be a candidate for SCT.  I have discussed with her I will discuss case with [facility] for possible referral.   I have presented her options of therapy with Vidaza dosed at 75 mg/m2 per day for 5 days initially.  This may expand to 7 days pending tolerance of therapy.  Pt is planned for 4-6 months of therapy with repeat evaluation and option of maintenance therapy.  Vidaza is a option for pt not suitable for transplant and provides symptomatic improvement with tolerability of therapy and some survival benefit but does not offer cure.  Goals of therapy reviewed and are for disease control.    Side effects of Vidaza were reviewed with pt and she was provided written information regarding her diagnosis and treatment options.  Pt will be set up for chemotherapy teaching.  Will plan to begin therapy ASAP. Pt will have weekly  labs done once therapy begins.  I have discussed with her potential need for transfusion therapy as therapy proceeds.  All questions answered and she expressed understanding of information presented.    2.  Cardiomyopathy.  Pt has defibrillator in place.  She is followed by  Cardiology.  Recent Echo done 07/17/2018 showed EF 25-30%.  Follow-up with Cardiology as directed.    3.  Anemia.  Due to MDS. Pt is planned for Vidaza therapy and will check weekly labs once therapy begins.  Transfuse as needed pending counts.    4  Hypothyroidism.  Recent TSH WNL.  Follow-up with PCP for monitoring.   5.  HTN.  BP is 115/51.  Follow-up with PCP.   6.  Health maintenance.  Pt had colonoscopy in 2016 that showed a polyp  but no evidence of dysplasia.  EGD done 08/07/2018 showed single gastric polyp and evidence of gastritis.  Follow-up with GI as recommended.  Mammogram was 05/2018 and was negative.  Continue mammogram screenings as recommended.    25 minutes spent with more than 50% spent in review of records, counseling and coordination of care.    Interval History:  Historical data obtained from note dated 08/03/2018.  73 year old female previously followed by Dr. Audelia Hives.  Pt has history of serrated sessile polyp (2) identified initially 3 years earlier. Her primary care physician is Dr. Tedra Senegal.  Her gastroenterologist is Dr. Zenovia Jarred.  Over the past 6 months, she was aware that she was anemic.   Laboratory studies from May 01, 2015 show hemoglobin 11.6 hematocrit 33.3 WBC 3.8; platelets 307,000.  On December 05, 2015: A complete blood count showed hemoglobin 11.1 hematocrit 33.4 WBC 5.6; platelets 339,000.  On May 03, 2017: A complete blood count showed hemoglobin 10.9 hematocrit 33.9 MCV 103.7 MCH 33.3 RDW 17.4 WBC 5.0 with 47% neutrophils 43% lymphocytes 7% monocytes 2% eosinophils 1% basophil; platelets 349,000.  More recently, on May 12, 2018: A complete blood count showed hemoglobin 9.7 hematocrit 29.0 MCV 101.4 [facility]  33.9 RDW 17.3 WBC 4.2 with 47% neutrophils 43% lymphocytes 7% monocytes 2% eosinophils 1% basophil; platelets 333,000.  On May 25, 2018: A complete blood count showed hemoglobin 9.6 hematocrit 28.8 MCV 101.4 MCH 33.8 RDW 17.9 WBC 3.8 with 39% neutrophils 51% lymphocytes 7% monocytes 2% eosinophils 1% basophil; platelets 338,000.  She has had 2 colonoscopies.  In 2015 a Cologuard was positive.  A serrated sessile polyp was removed from the ascending colon at that time.  A follow-up colonoscopy one year later on February 25, 2015 revealed a second 5 mm sessile serrated polyp in the descending colon. The prior polypectomy site in the ascending colon showed no residual polyp.  She denies any  bright red blood per rectum or melena.  She has no significant change in bowel habits although her stools vary from constipation to loose to normal.  She denies abdominal pain or tenesmus.  She has had dyspepsia and dysphagia in spite of omeprazole. She denies odynophagia. Solid food seems to get stuck in her epigastrium.    Current Status:  Pt is seen today for follow-up.  She is here to go over recent bone marrow biopsy results.      MDS (myelodysplastic syndrome) ([place])   12/27/2018 Initial Diagnosis    MDS (myelodysplastic syndrome) ([place])    01/01/2019 -  Chemotherapy    The patient had azaCITIDine (VIDAZA) chemo injection 142.5 mg, 75 mg/m2, Subcutaneous,  Once, 0 of 4 cycles  for chemotherapy treatment.       Problem List Patient Active Problem List   Diagnosis Date Noted  . MDS (myelodysplastic syndrome) ([place]) [D46.9] 12/27/2018  . Iron deficiency [E61.1] 09/21/2018  . Anemia [D64.9] 08/03/2018  . Impaired glucose tolerance [R73.02] 06/21/2017  . Cough variant asthma  vs UACS from ACEi  [J45.991] 12/31/2016  . Essential hypertension [I10] 12/31/2016  . Morbid obesity due to excess calories ([place]) [E66.01] 12/31/2016  . History of colonic polyps [Z86.010]   . Benign neoplasm of descending colon [D12.4]   . Diverticulosis of colon without hemorrhage [K57.30] 01/28/2014  . 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 Nonspecific abnormal finding in stool contents [R19.5] 11/29/2013  . Colon cancer screening [Z12.11] 11/29/2013  . History of depression [Z86.59] 08/11/2012  . Chronic systolic CHF (congestive heart failure) ([place]) [I50.22]   . Systolic CHF, chronic ([place]) [I50.22]   . LBBB (left bundle branch block) [I44.7]   . Automatic implantable cardioverter-defibrillator in situ [Z95.810]   . Hypothyroidism [E03.9]   . GERD (gastroesophageal reflux disease) [K21.9]   . Dyslipidemia [E78.5]   . Mitral regurgitation [I34.0]   . Overweight [E66.3]   . Ejection fraction < 50%  [R94.30]     Past Medical History Past Medical History:  Diagnosis Date  . Automatic implantable cardioverter-defibrillator in situ    Dr. Beckie Salts follows-next visit- [phone]  . Cataract   . Diverticulosis   . Dyslipidemia   . Ejection fraction < 50%    EF 25-30%, September, 2010  . GERD (gastroesophageal reflux disease)   . Hypothyroidism   . ICD (implantable cardiac defibrillator) in place    10/2009; Dr. Lovena Le  . Iron deficiency 09/21/2018  . LBBB (left bundle branch block)    old  . Macrocytic anemia   . MDS (myelodysplastic syndrome) ([place]) 12/27/2018  . Mitral regurgitation    Mild, echo, September, 2010  . Nonischemic cardiomyopathy ([place])    Normal coronary arteries, catheterization, September, 2009  . Overweight(278.02)   . Serrated polyp of colon   .  Systolic CHF, chronic ([facility])    no recent problems    Past Surgical History Past Surgical History:  Procedure Laterality Date  . BIOPSY  08/07/2018   Procedure: BIOPSY;  Surgeon: Jerene Bears, MD;  Location: [facility];  Service: Gastroenterology;;  . CARDIAC CATHETERIZATION     '09 last  . [place]    . COLONOSCOPY N/A 11/29/2013   Procedure: COLONOSCOPY;  Surgeon: Jerene Bears, MD;  Location: [facility];  Service: Gastroenterology;  Laterality: N/A;  . COLONOSCOPY N/A 02/25/2015   Procedure: COLONOSCOPY;  Surgeon: Jerene Bears, MD;  Location: [facility];  Service: Gastroenterology;  Laterality: N/A;  . ESOPHAGOGASTRODUODENOSCOPY (EGD) WITH PROPOFOL N/A 08/07/2018   Procedure: ESOPHAGOGASTRODUODENOSCOPY (EGD) WITH PROPOFOL;  Surgeon: Jerene Bears, MD;  Location: [facility];  Service: Gastroenterology;  Laterality: N/A;  . EYE SURGERY Left    x 3-"droopy eyelid"  . heart catherization    . TONSILLECTOMY  1952    Family History Family History  Problem Relation Age of Onset  . Heart attack Mother   . Heart disease Mother   . Hypertension Mother   . Lung cancer Father   .  Pancreatic cancer Father   . Cancer Father   . Stroke Paternal Grandfather   . Colon cancer Neg Hx   . Diabetes Neg Hx   . Kidney disease Neg Hx   . Liver disease Neg Hx      Social History  reports that she has never smoked. She has never used smokeless tobacco. She reports current alcohol use. She reports that she does not use drugs.  Medications  Current Outpatient Medications:  .  albuterol (PROVENTIL HFA;VENTOLIN HFA) 108 (90 Base) MCG/ACT inhaler, Inhale 2 puffs into the lungs every 6 (six) hours as needed for wheezing or shortness of breath., Disp: 1 Inhaler, Rfl: prn .  carvedilol (COREG) 25 MG tablet, TAKE 1 TABLET BY MOUTH TWICE A DAY, Disp: 180 tablet, Rfl: 2 .  folic acid (FOLVITE) 1 MG tablet, Take 1 tablet (1 mg total) by mouth daily., Disp: 90 tablet, Rfl: 0 .  furosemide (LASIX) 40 MG tablet, TAKE 1 TABLET BY MOUTH EVERY DAY, Disp: 90 tablet, Rfl: 2 .  glucose blood (ACCU-CHEK AVIVA) test strip, Check daily.  For Diabetes 250.00, Disp: 100 each, Rfl: 12 .  ipratropium-albuterol (DUONEB) 0.5-2.5 (3) MG/3ML SOLN, Take 3 mLs by nebulization every 4 (four) hours as needed (shortness of breath or wheezing)., Disp: , Rfl:  .  Lancets MISC, Check glucose daily.  Diabetes 250.00, Disp: 100 each, Rfl: 11 .  levothyroxine (SYNTHROID, LEVOTHROID) 150 MCG tablet, TAKE 1 TABLET BY MOUTH EVERY DAY, Disp: 90 tablet, Rfl: 0 .  losartan (COZAAR) 25 MG tablet, TAKE 1 TABLET BY MOUTH EVERY DAY, Disp: 90 tablet, Rfl: 2 .  pantoprazole (PROTONIX) 40 MG tablet, Take 1 tablet (40 mg total) by mouth daily., Disp: 90 tablet, Rfl: 3 .  Propylene Glycol (SYSTANE BALANCE) 0.6 % SOLN, Place 1 drop into both eyes daily as needed (for dry eyes)., Disp: , Rfl:  .  doxycycline (VIBRA-TABS) 100 MG tablet, Take 1 tablet (100 mg total) by mouth 2 (two) times daily. (Patient not taking: Reported on 12/27/2018), Disp: 20 tablet, Rfl: 0  Allergies Patient has no known allergies.  Review of Systems Review of  Systems - Oncology ROS negative   Physical Exam  Vitals Wt Readings from Last 3 Encounters:  12/27/18 191 lb 3.2 oz (86.7 kg)  12/15/18 192 lb  3.2 oz (87.2 kg)  12/04/18 192 lb 3.2 oz (87.2 kg)   Temp Readings from Last 3 Encounters:  12/27/18 98 F (36.7 C) (Oral)  12/15/18 98.2 F (36.8 C) (Oral)  12/04/18 (!) 97.5 F (36.4 C) (Oral)   BP Readings from Last 3 Encounters:  12/27/18 (!) 115/51  12/15/18 99/69  12/04/18 (!) 111/56   Pulse Readings from Last 3 Encounters:  12/27/18 78  12/15/18 72  12/04/18 80   Constitutional: Well-developed, well-nourished, and in no distress.   HENT: Head: Normocephalic and atraumatic.  Mouth/Throat: No oropharyngeal exudate. Mucosa moist. Eyes: Pupils are equal, round, and reactive to light. Conjunctivae are normal. No scleral icterus.  Neck: Normal range of motion. Neck supple. No JVD present.  Cardiovascular: Normal rate, regular rhythm and normal heart sounds.  Exam reveals no gallop and no friction rub.   No murmur heard. Pulmonary/Chest: Effort normal and breath sounds normal. No respiratory distress. No wheezes.No rales.  Abdominal: Soft. Bowel sounds are normal. No distension. There is no tenderness. There is no guarding.  Musculoskeletal: No edema or tenderness.  Lymphadenopathy: No cervical, axillary or supraclavicular adenopathy.  Neurological: Alert and oriented to person, place, and time. No cranial nerve deficit.  Skin: Skin is warm and dry. No rash noted. No erythema. No pallor.  Psychiatric: Affect and judgment normal.   Labs Appointment on 12/27/2018  Component Date Value Ref Range Status  . LDH 12/27/2018 125  98 - 192 U/L Final   Performed at [facility], [place] [address], [place], [place] [address]  . Sodium 12/27/2018 143  135 - 145 mmol/L Final  . Potassium 12/27/2018 3.5  3.5 - 5.1 mmol/L Final  . Chloride 12/27/2018 101  98 - 111 mmol/L Final  . CO2 12/27/2018 31  22 - 32 mmol/L  Final  . Glucose, Bld 12/27/2018 111* 70 - 99 mg/dL Final  . BUN 12/27/2018 17  8 - 23 mg/dL Final  . Creatinine 12/27/2018 0.83  0.44 - 1.00 mg/dL Final  . Calcium 12/27/2018 9.1  8.9 - 10.3 mg/dL Final  . Total Protein 12/27/2018 6.7  6.5 - 8.1 g/dL Final  . Albumin 12/27/2018 3.7  3.5 - 5.0 g/dL Final  . AST 12/27/2018 10* 15 - 41 U/L Final  . ALT 12/27/2018 9  0 - 44 U/L Final  . Alkaline Phosphatase 12/27/2018 61  38 - 126 U/L Final  . Total Bilirubin 12/27/2018 1.4* 0.3 - 1.2 mg/dL Final  . GFR, Est Non Af Am 12/27/2018 >60  >60 mL/min Final  . GFR, Est AFR Am 12/27/2018 >60  >60 mL/min Final  . Anion gap 12/27/2018 11  5 - 15 Final   Performed at [facility], [place] [address], [place],  [address]  . WBC Count 12/27/2018 5.1  4.0 - 10.5 K/uL Final  . RBC 12/27/2018 2.23* 3.87 - 5.11 MIL/uL Final  . Hemoglobin 12/27/2018 7.9* 12.0 - 15.0 g/dL Final  . HCT 12/27/2018 24.1* 36.0 - 46.0 % Final  . MCV 12/27/2018 108.1* 80.0 - 100.0 fL Final  . MCH 12/27/2018 35.4* 26.0 - 34.0 pg Final  . MCHC 12/27/2018 32.8  30.0 - 36.0 g/dL Final  . RDW 12/27/2018 23.7* 11.5 - 15.5 % Final  . Platelet Count 12/27/2018 234  150 - 400 K/uL Final  . nRBC 12/27/2018 1.4* 0.0 - 0.2 % Final  . Neutrophils Relative % 12/27/2018 65  % Final  . Neutro Abs 12/27/2018 3.4  1.7 -  7.7 K/uL Final  . Lymphocytes Relative 12/27/2018 27  % Final  . Lymphs Abs 12/27/2018 1.4  0.7 - 4.0 K/uL Final  . Monocytes Relative 12/27/2018 6  % Final  . Monocytes Absolute 12/27/2018 0.3  0.1 - 1.0 K/uL Final  . Eosinophils Relative 12/27/2018 1  % Final  . Eosinophils Absolute 12/27/2018 0.0  0.0 - 0.5 K/uL Final  . Basophils Relative 12/27/2018 0  % Final  . Basophils Absolute 12/27/2018 0.0  0.0 - 0.1 K/uL Final  . RBC Morphology 12/27/2018 Polychromasia, ovalocytes, and target cells present.   Corrected  . Immature Granulocytes 12/27/2018 1  % Final  . Abs Immature Granulocytes  12/27/2018 0.04  0.00 - 0.07 K/uL Final   Performed at [facility], [place] [address], [place], [place] [address]     Pathology No orders of the defined types were placed in this encounter.      Zoila Shutter MD

## 2018-12-28 ENCOUNTER — Telehealth: Payer: Self-pay | Admitting: *Deleted

## 2018-12-28 NOTE — Telephone Encounter (Signed)
Called & left message for pt to return call to discuss patient education & to see if she is willing to do education via phone on Monday.  Will leave information for pt to p/u if she would like before education class.  Left education office ph # to call back.

## 2018-12-29 ENCOUNTER — Telehealth: Payer: Self-pay | Admitting: Internal Medicine

## 2018-12-29 NOTE — Telephone Encounter (Signed)
Called regarding schedule °

## 2019-01-01 ENCOUNTER — Inpatient Hospital Stay: Payer: Medicare Other

## 2019-01-01 ENCOUNTER — Other Ambulatory Visit: Payer: Self-pay

## 2019-01-01 ENCOUNTER — Telehealth: Payer: Self-pay | Admitting: *Deleted

## 2019-01-01 NOTE — Telephone Encounter (Signed)
Telephone education completed by phone with pt's permission.  Questions answered.

## 2019-01-03 ENCOUNTER — Ambulatory Visit: Payer: Medicare Other

## 2019-01-03 ENCOUNTER — Other Ambulatory Visit: Payer: Medicare Other

## 2019-01-04 ENCOUNTER — Other Ambulatory Visit: Payer: Self-pay | Admitting: Internal Medicine

## 2019-01-04 DIAGNOSIS — D469 Myelodysplastic syndrome, unspecified: Secondary | ICD-10-CM

## 2019-01-04 MED ORDER — PROCHLORPERAZINE MALEATE 10 MG PO TABS: 10.0000 mg | ORAL_TABLET | Freq: Four times a day (QID) | ORAL | 1 refills | Status: DC | PRN

## 2019-01-05 ENCOUNTER — Telehealth: Payer: Self-pay | Admitting: *Deleted

## 2019-01-05 ENCOUNTER — Other Ambulatory Visit: Payer: Self-pay | Admitting: Internal Medicine

## 2019-01-05 ENCOUNTER — Telehealth: Payer: Self-pay | Admitting: Internal Medicine

## 2019-01-05 ENCOUNTER — Telehealth: Payer: Self-pay

## 2019-01-05 DIAGNOSIS — D469 Myelodysplastic syndrome, unspecified: Secondary | ICD-10-CM

## 2019-01-05 NOTE — Telephone Encounter (Signed)
TCT patient to inform her that she does not need to come in for labs on Monday, 01/08/19 as she had lab work done on 12/27/18. These labs are acceptable for treatment per Dr. Walden Field  Left vm message for pt regarding cancelled lab appt for 01/08/19

## 2019-01-05 NOTE — Telephone Encounter (Signed)
Received message from patient following up on a request for nausea med to be called in to pharmacy. Contacted patient and made aware that a prescription for Compazine has been sent by Dr. Walden Field to her pharmacy. Patient verbalized understanding and had no other questions or concerns.

## 2019-01-05 NOTE — Telephone Encounter (Signed)
Spoke with pharmacy and nursing.  Labs recently done on 12/27/2018 are adequate for therapy. Pt will not need labs on 01/08/2019.    Pt will have labs weekly beginning Jan 17, 2019.

## 2019-01-05 NOTE — Progress Notes (Signed)
Spoke w/ Dr. Walden Field on 01/05/19 - please use labs from 4/15 for first azacitadine treatment cycle. She will get weekly labs after this.   Demetrius Charity, PharmD, [place] [facility] Pharmacist Pharmacy Phone: [phone] 01/05/2019

## 2019-01-08 ENCOUNTER — Inpatient Hospital Stay: Payer: Medicare Other

## 2019-01-08 ENCOUNTER — Other Ambulatory Visit: Payer: Self-pay

## 2019-01-08 VITALS — BP 104/53 | HR 75 | Temp 98.1°F | Resp 18 | Wt 189.5 lb

## 2019-01-08 DIAGNOSIS — E039 Hypothyroidism, unspecified: Secondary | ICD-10-CM | POA: Diagnosis not present

## 2019-01-08 DIAGNOSIS — I10 Essential (primary) hypertension: Secondary | ICD-10-CM | POA: Diagnosis not present

## 2019-01-08 DIAGNOSIS — D469 Myelodysplastic syndrome, unspecified: Secondary | ICD-10-CM | POA: Diagnosis not present

## 2019-01-08 DIAGNOSIS — Z800 Family history of malignant neoplasm of digestive organs: Secondary | ICD-10-CM | POA: Diagnosis not present

## 2019-01-08 DIAGNOSIS — Z5111 Encounter for antineoplastic chemotherapy: Secondary | ICD-10-CM | POA: Diagnosis not present

## 2019-01-08 MED ORDER — AZACITIDINE CHEMO SQ INJECTION
75.0000 mg/m2 | Freq: Once | INTRAMUSCULAR | Status: AC
  Administered 2019-01-08: 11:00:00 142.5 mg via SUBCUTANEOUS
  Filled 2019-01-08: qty 5.7

## 2019-01-08 MED ORDER — ONDANSETRON HCL 8 MG PO TABS
8.0000 mg | ORAL_TABLET | Freq: Once | ORAL | Status: AC
  Administered 2019-01-08: 8 mg via ORAL

## 2019-01-08 MED ORDER — ONDANSETRON HCL 8 MG PO TABS
ORAL_TABLET | ORAL | Status: AC
  Filled 2019-01-08: qty 1

## 2019-01-08 NOTE — Patient Instructions (Signed)
[facility] Discharge Instructions for Patients Receiving Chemotherapy  Today you received the following chemotherapy agents Azacitidine (VIDAZA).  To help prevent nausea and vomiting after your treatment, we encourage you to take your nausea medication as prescribed.   If you develop nausea and vomiting that is not controlled by your nausea medication, call the clinic.   BELOW ARE SYMPTOMS THAT SHOULD BE REPORTED IMMEDIATELY:  *FEVER GREATER THAN 100.5 F  *CHILLS WITH OR WITHOUT FEVER  NAUSEA AND VOMITING THAT IS NOT CONTROLLED WITH YOUR NAUSEA MEDICATION  *UNUSUAL SHORTNESS OF BREATH  *UNUSUAL BRUISING OR BLEEDING  TENDERNESS IN MOUTH AND THROAT WITH OR WITHOUT PRESENCE OF ULCERS  *URINARY PROBLEMS  *BOWEL PROBLEMS  UNUSUAL RASH Items with * indicate a potential emergency and should be followed up as soon as possible.  Feel free to call the clinic should you have any questions or concerns. The clinic phone number is [phone].  Please show the [place] [facility] at check-in to the Emergency Department and triage nurse.  Azacitidine suspension for injection (subcutaneous use) What is this medicine? AZACITIDINE ([place]) is a chemotherapy drug. This medicine reduces the growth of cancer cells and can suppress the immune system. It is used for treating myelodysplastic syndrome or some types of leukemia. This medicine may be used for other purposes; ask your health care provider or pharmacist if you have questions. COMMON BRAND NAME(S): Vidaza What should I tell my health care provider before I take this medicine? They need to know if you have any of these conditions: -kidney disease -liver disease -liver tumors -an unusual or allergic reaction to azacitidine, mannitol, other medicines, foods, dyes, or preservatives -pregnant or trying to get pregnant -breast-feeding How should I use this medicine? This medicine is for injection under the skin.  It is administered in a hospital or clinic by a specially trained health care professional. Talk to your pediatrician regarding the use of this medicine in children. While this drug may be prescribed for selected conditions, precautions do apply. Overdosage: If you think you have taken too much of this medicine contact a poison control center or emergency room at once. NOTE: This medicine is only for you. Do not share this medicine with others. What if I miss a dose? It is important not to miss your dose. Call your doctor or health care professional if you are unable to keep an appointment. What may interact with this medicine? Interactions have not been studied. Give your health care provider a list of all the medicines, herbs, non-prescription drugs, or dietary supplements you use. Also tell them if you smoke, drink alcohol, or use illegal drugs. Some items may interact with your medicine. This list may not describe all possible interactions. Give your health care provider a list of all the medicines, herbs, non-prescription drugs, or dietary supplements you use. Also tell them if you smoke, drink alcohol, or use illegal drugs. Some items may interact with your medicine. What should I watch for while using this medicine? Visit your doctor for checks on your progress. This drug may make you feel generally unwell. This is not uncommon, as chemotherapy can affect healthy cells as well as cancer cells. Report any side effects. Continue your course of treatment even though you feel ill unless your doctor tells you to stop. In some cases, you may be given additional medicines to help with side effects. Follow all directions for their use. Call your doctor or health care professional  for advice if you get a fever, chills or sore throat, or other symptoms of a cold or flu. Do not treat yourself. This drug decreases your body's ability to fight infections. Try to avoid being around people who are sick. This  medicine may increase your risk to bruise or bleed. Call your doctor or health care professional if you notice any unusual bleeding. You may need blood work done while you are taking this medicine. Do not become pregnant while taking this medicine and for 6 months after the last dose. Women should inform their doctor if they wish to become pregnant or think they might be pregnant. Men should not father a child while taking this medicine and for 3 months after the last dose. There is a potential for serious side effects to an unborn child. Talk to your health care professional or pharmacist for more information. Do not breast-feed an infant while taking this medicine and for 1 week after the last dose. This medicine may interfere with the ability to have a child. Talk with your doctor or health care professional if you are concerned about your fertility. What side effects may I notice from receiving this medicine? Side effects that you should report to your doctor or health care professional as soon as possible: -allergic reactions like skin rash, itching or hives, swelling of the face, lips, or tongue -low blood counts - this medicine may decrease the number of white blood cells, red blood cells and platelets. You may be at increased risk for infections and bleeding. -signs of infection - fever or chills, cough, sore throat, pain passing urine -signs of decreased platelets or bleeding - bruising, pinpoint red spots on the skin, black, tarry stools, blood in the urine -signs of decreased red blood cells - unusually weak or tired, fainting spells, lightheadedness -signs and symptoms of kidney injury like trouble passing urine or change in the amount of urine -signs and symptoms of liver injury like dark yellow or brown urine; general ill feeling or flu-like symptoms; light-colored stools; loss of appetite; nausea; right upper belly pain; unusually weak or tired; yellowing of the eyes or skin Side effects  that usually do not require medical attention (report to your doctor or health care professional if they continue or are bothersome): -constipation -diarrhea -nausea, vomiting -pain or redness at the injection site -unusually weak or tired This list may not describe all possible side effects. Call your doctor for medical advice about side effects. You may report side effects to FDA at 1-800-FDA-1088. Where should I keep my medicine? This drug is given in a hospital or clinic and will not be stored at home. NOTE: This sheet is a summary. It may not cover all possible information. If you have questions about this medicine, talk to your doctor, pharmacist, or health care provider.  2019 Elsevier/Gold Standard (2016-09-28 14:37:51)  Coronavirus (COVID-19) Are you at risk?  Are you at risk for the Coronavirus (COVID-19)?  To be considered HIGH RISK for Coronavirus (COVID-19), you have to meet the following criteria:  . Traveled to [place], [place], [place], [place] or [place]; or in the [place] to [place], [place], [place], or [place]; and have fever, cough, and shortness of breath within the last 2 weeks of travel OR . Been in close contact with a person diagnosed with COVID-19 within the last 2 weeks and have fever, cough, and shortness of breath . IF YOU DO NOT MEET THESE CRITERIA, YOU ARE CONSIDERED LOW  RISK FOR COVID-19.  What to do if you are HIGH RISK for COVID-19?  Marland Kitchen If you are having a medical emergency, call 911. . Seek medical care right away. Before you go to a doctor's office, urgent care or emergency department, call ahead and tell them about your recent travel, contact with someone diagnosed with COVID-19, and your symptoms. You should receive instructions from your physician's office regarding next steps of care.  . When you arrive at healthcare provider, tell the healthcare staff immediately you have returned from visiting [place], [place], [place], [place] or [place];  or traveled in the [place] to [place], [place], [place], or [place]; in the last two weeks or you have been in close contact with a person diagnosed with COVID-19 in the last 2 weeks.   . Tell the health care staff about your symptoms: fever, cough and shortness of breath. . After you have been seen by a medical provider, you will be either: o Tested for (COVID-19) and discharged home on quarantine except to seek medical care if symptoms worsen, and asked to  - Stay home and avoid contact with others until you get your results (4-5 days)  - Avoid travel on public transportation if possible (such as bus, train, or airplane) or o Sent to the Emergency Department by EMS for evaluation, COVID-19 testing, and possible admission depending on your condition and test results.  What to do if you are LOW RISK for COVID-19?  Reduce your risk of any infection by using the same precautions used for avoiding the common cold or flu:  Marland Kitchen Wash your hands often with soap and warm water for at least 20 seconds.  If soap and water are not readily available, use an alcohol-based hand sanitizer with at least 60% alcohol.  . If coughing or sneezing, cover your mouth and nose by coughing or sneezing into the elbow areas of your shirt or coat, into a tissue or into your sleeve (not your hands). . Avoid shaking hands with others and consider head nods or verbal greetings only. . Avoid touching your eyes, nose, or mouth with unwashed hands.  . Avoid close contact with people who are sick. . Avoid places or events with large numbers of people in one location, like concerts or sporting events. . Carefully consider travel plans you have or are making. . If you are planning any travel outside or inside the [place], visit the CDC's Travelers' Health webpage for the latest health notices. . If you have some symptoms but not all symptoms, continue to monitor at home and seek medical attention if your symptoms  worsen. . If you are having a medical emergency, call 911.   [place] / e-Visit: [email or web address]         [facility]: [facility]: [phone]                   [facility]: [phone]

## 2019-01-09 ENCOUNTER — Telehealth: Payer: Self-pay | Admitting: *Deleted

## 2019-01-09 ENCOUNTER — Other Ambulatory Visit: Payer: Self-pay

## 2019-01-09 ENCOUNTER — Inpatient Hospital Stay: Payer: Medicare Other

## 2019-01-09 ENCOUNTER — Other Ambulatory Visit: Payer: Medicare Other

## 2019-01-09 VITALS — BP 102/44 | HR 73 | Temp 98.8°F | Resp 18

## 2019-01-09 DIAGNOSIS — I10 Essential (primary) hypertension: Secondary | ICD-10-CM | POA: Diagnosis not present

## 2019-01-09 DIAGNOSIS — D469 Myelodysplastic syndrome, unspecified: Secondary | ICD-10-CM | POA: Diagnosis not present

## 2019-01-09 DIAGNOSIS — Z5111 Encounter for antineoplastic chemotherapy: Secondary | ICD-10-CM | POA: Diagnosis not present

## 2019-01-09 DIAGNOSIS — E039 Hypothyroidism, unspecified: Secondary | ICD-10-CM | POA: Diagnosis not present

## 2019-01-09 DIAGNOSIS — Z800 Family history of malignant neoplasm of digestive organs: Secondary | ICD-10-CM | POA: Diagnosis not present

## 2019-01-09 MED ORDER — ONDANSETRON HCL 8 MG PO TABS
8.0000 mg | ORAL_TABLET | Freq: Once | ORAL | Status: AC
  Administered 2019-01-09: 10:00:00 8 mg via ORAL

## 2019-01-09 MED ORDER — ONDANSETRON HCL 8 MG PO TABS
ORAL_TABLET | ORAL | Status: AC
  Filled 2019-01-09: qty 1

## 2019-01-09 MED ORDER — AZACITIDINE CHEMO SQ INJECTION
75.0000 mg/m2 | Freq: Once | INTRAMUSCULAR | Status: AC
  Administered 2019-01-09: 11:00:00 142.5 mg via SUBCUTANEOUS
  Filled 2019-01-09: qty 5.7

## 2019-01-09 NOTE — Patient Instructions (Signed)
[facility] Discharge Instructions for Patients Receiving Chemotherapy  Today you received the following chemotherapy agents Azacitidine (VIDAZA).  To help prevent nausea and vomiting after your treatment, we encourage you to take your nausea medication as prescribed.   If you develop nausea and vomiting that is not controlled by your nausea medication, call the clinic.   BELOW ARE SYMPTOMS THAT SHOULD BE REPORTED IMMEDIATELY:  *FEVER GREATER THAN 100.5 F  *CHILLS WITH OR WITHOUT FEVER  NAUSEA AND VOMITING THAT IS NOT CONTROLLED WITH YOUR NAUSEA MEDICATION  *UNUSUAL SHORTNESS OF BREATH  *UNUSUAL BRUISING OR BLEEDING  TENDERNESS IN MOUTH AND THROAT WITH OR WITHOUT PRESENCE OF ULCERS  *URINARY PROBLEMS  *BOWEL PROBLEMS  UNUSUAL RASH Items with * indicate a potential emergency and should be followed up as soon as possible.  Feel free to call the clinic should you have any questions or concerns. The clinic phone number is [phone].  Please show the CHEMO ALERT CARD at check-in to the Emergency Department and triage nurse.  Coronavirus (COVID-19) Are you at risk?  Are you at risk for the Coronavirus (COVID-19)?  To be considered HIGH RISK for Coronavirus (COVID-19), you have to meet the following criteria:  . Traveled to [place], [place], [place], [place] or [place]; or in the [place] to [place], [place], [place], or [place]; and have fever, cough, and shortness of breath within the last 2 weeks of travel OR . Been in close contact with a person diagnosed with COVID-19 within the last 2 weeks and have fever, cough, and shortness of breath . IF YOU DO NOT MEET THESE CRITERIA, YOU ARE CONSIDERED LOW RISK FOR COVID-19.  What to do if you are HIGH RISK for COVID-19?  . If you are having a medical emergency, call 911. . Seek medical care right away. Before you go to a doctor's office, urgent care or emergency department, call ahead and tell them  about your recent travel, contact with someone diagnosed with COVID-19, and your symptoms. You should receive instructions from your physician's office regarding next steps of care.  . When you arrive at healthcare provider, tell the healthcare staff immediately you have returned from visiting [place], [place], [place], [place] or [place]; or traveled in the [place] to [place], [place], [place], or [place]; in the last two weeks or you have been in close contact with a person diagnosed with COVID-19 in the last 2 weeks.   . Tell the health care staff about your symptoms: fever, cough and shortness of breath. . After you have been seen by a medical provider, you will be either: o Tested for (COVID-19) and discharged home on quarantine except to seek medical care if symptoms worsen, and asked to  - Stay home and avoid contact with others until you get your results (4-5 days)  - Avoid travel on public transportation if possible (such as bus, train, or airplane) or o Sent to the Emergency Department by EMS for evaluation, COVID-19 testing, and possible admission depending on your condition and test results.  What to do if you are LOW RISK for COVID-19?  Reduce your risk of any infection by using the same precautions used for avoiding the common cold or flu:  . Wash your hands often with soap and warm water for at least 20 seconds.  If soap and water are not readily available, use an alcohol-based hand sanitizer with at least 60% alcohol.  . If coughing or   sneezing, cover your mouth and nose by coughing or sneezing into the elbow areas of your shirt or coat, into a tissue or into your sleeve (not your hands). . Avoid shaking hands with others and consider head nods or verbal greetings only. . Avoid touching your eyes, nose, or mouth with unwashed hands.  . Avoid close contact with people who are sick. . Avoid places or events with large numbers of people in one location, like concerts or  sporting events. . Carefully consider travel plans you have or are making. . If you are planning any travel outside or inside the [place], visit the CDC's Travelers' Health webpage for the latest health notices. . If you have some symptoms but not all symptoms, continue to monitor at home and seek medical attention if your symptoms worsen. . If you are having a medical emergency, call 911.   ADDITIONAL HEALTHCARE OPTIONS FOR PATIENTS  [facility] / e-Visit: [email or web address]         [facility]: [phone]  [facility]: [phone]                   [facility]: [phone]   

## 2019-01-09 NOTE — Telephone Encounter (Signed)
-----   Message from Georgianne Fick, RN sent at 01/08/2019 11:35 AM EDT ----- Regarding: Dr. Walden Field first time [place] Patient received first time Vidaza SQ Injection today and tolerated this well.

## 2019-01-09 NOTE — Telephone Encounter (Signed)
Called [name] for chemotherapy F/U.  Patient is doing well.  Denies vomiting with slight nausea.  Denies any new side effects or symptoms.  Passing gas.  Eats meal then stomach makes noises like diarrhea will begin but no diarrhea.  Bowel and bladder functioning well.  Eating and drinking well.  Instructed to drink 64 oz minimum daily or at least the day before, of and after treatment.   Denies questions or needs at this time.  Encouraged to call [phone] Mon -Fri 8:00 am - 4:30 pm or anytime as needed for symptoms, changes or event outside office hours.

## 2019-01-10 ENCOUNTER — Other Ambulatory Visit: Payer: Self-pay

## 2019-01-10 ENCOUNTER — Other Ambulatory Visit: Payer: Medicare Other

## 2019-01-10 ENCOUNTER — Inpatient Hospital Stay: Payer: Medicare Other

## 2019-01-10 VITALS — BP 102/59 | HR 83 | Temp 97.6°F | Resp 18

## 2019-01-10 DIAGNOSIS — Z5111 Encounter for antineoplastic chemotherapy: Secondary | ICD-10-CM | POA: Diagnosis not present

## 2019-01-10 DIAGNOSIS — D469 Myelodysplastic syndrome, unspecified: Secondary | ICD-10-CM

## 2019-01-10 DIAGNOSIS — I10 Essential (primary) hypertension: Secondary | ICD-10-CM | POA: Diagnosis not present

## 2019-01-10 DIAGNOSIS — Z800 Family history of malignant neoplasm of digestive organs: Secondary | ICD-10-CM | POA: Diagnosis not present

## 2019-01-10 DIAGNOSIS — E039 Hypothyroidism, unspecified: Secondary | ICD-10-CM | POA: Diagnosis not present

## 2019-01-10 MED ORDER — ONDANSETRON HCL 8 MG PO TABS
8.0000 mg | ORAL_TABLET | Freq: Once | ORAL | Status: AC
  Administered 2019-01-10: 10:00:00 8 mg via ORAL

## 2019-01-10 MED ORDER — AZACITIDINE CHEMO SQ INJECTION
75.0000 mg/m2 | Freq: Once | INTRAMUSCULAR | Status: AC
  Administered 2019-01-10: 11:00:00 142.5 mg via SUBCUTANEOUS
  Filled 2019-01-10: qty 5.7

## 2019-01-10 MED ORDER — ONDANSETRON HCL 8 MG PO TABS
ORAL_TABLET | ORAL | Status: AC
  Filled 2019-01-10: qty 1

## 2019-01-10 NOTE — Patient Instructions (Signed)
[facility] Discharge Instructions for Patients Receiving Chemotherapy  Today you received the following chemotherapy agents Azacitidine (VIDAZA).  To help prevent nausea and vomiting after your treatment, we encourage you to take your nausea medication as prescribed.   If you develop nausea and vomiting that is not controlled by your nausea medication, call the clinic.   BELOW ARE SYMPTOMS THAT SHOULD BE REPORTED IMMEDIATELY:  *FEVER GREATER THAN 100.5 F  *CHILLS WITH OR WITHOUT FEVER  NAUSEA AND VOMITING THAT IS NOT CONTROLLED WITH YOUR NAUSEA MEDICATION  *UNUSUAL SHORTNESS OF BREATH  *UNUSUAL BRUISING OR BLEEDING  TENDERNESS IN MOUTH AND THROAT WITH OR WITHOUT PRESENCE OF ULCERS  *URINARY PROBLEMS  *BOWEL PROBLEMS  UNUSUAL RASH Items with * indicate a potential emergency and should be followed up as soon as possible.  Feel free to call the clinic should you have any questions or concerns. The clinic phone number is [phone].  Please show the CHEMO ALERT CARD at check-in to the Emergency Department and triage nurse.  Coronavirus (COVID-19) Are you at risk?  Are you at risk for the Coronavirus (COVID-19)?  To be considered HIGH RISK for Coronavirus (COVID-19), you have to meet the following criteria:  . Traveled to [place], [place], [place], [place] or [place]; or in the [place] to [place], [place], [place], or [place]; and have fever, cough, and shortness of breath within the last 2 weeks of travel OR . Been in close contact with a person diagnosed with COVID-19 within the last 2 weeks and have fever, cough, and shortness of breath . IF YOU DO NOT MEET THESE CRITERIA, YOU ARE CONSIDERED LOW RISK FOR COVID-19.  What to do if you are HIGH RISK for COVID-19?  . If you are having a medical emergency, call 911. . Seek medical care right away. Before you go to a doctor's office, urgent care or emergency department, call ahead and tell them  about your recent travel, contact with someone diagnosed with COVID-19, and your symptoms. You should receive instructions from your physician's office regarding next steps of care.  . When you arrive at healthcare provider, tell the healthcare staff immediately you have returned from visiting [place], [place], [place], [place] or [place]; or traveled in the [place] to [place], [place], [place], or [place]; in the last two weeks or you have been in close contact with a person diagnosed with COVID-19 in the last 2 weeks.   . Tell the health care staff about your symptoms: fever, cough and shortness of breath. . After you have been seen by a medical provider, you will be either: o Tested for (COVID-19) and discharged home on quarantine except to seek medical care if symptoms worsen, and asked to  - Stay home and avoid contact with others until you get your results (4-5 days)  - Avoid travel on public transportation if possible (such as bus, train, or airplane) or o Sent to the Emergency Department by EMS for evaluation, COVID-19 testing, and possible admission depending on your condition and test results.  What to do if you are LOW RISK for COVID-19?  Reduce your risk of any infection by using the same precautions used for avoiding the common cold or flu:  . Wash your hands often with soap and warm water for at least 20 seconds.  If soap and water are not readily available, use an alcohol-based hand sanitizer with at least 60% alcohol.  . If coughing or   sneezing, cover your mouth and nose by coughing or sneezing into the elbow areas of your shirt or coat, into a tissue or into your sleeve (not your hands). . Avoid shaking hands with others and consider head nods or verbal greetings only. . Avoid touching your eyes, nose, or mouth with unwashed hands.  . Avoid close contact with people who are sick. . Avoid places or events with large numbers of people in one location, like concerts or  sporting events. . Carefully consider travel plans you have or are making. . If you are planning any travel outside or inside the [place], visit the CDC's Travelers' Health webpage for the latest health notices. . If you have some symptoms but not all symptoms, continue to monitor at home and seek medical attention if your symptoms worsen. . If you are having a medical emergency, call 911.   ADDITIONAL HEALTHCARE OPTIONS FOR PATIENTS  [facility] / e-Visit: [email or web address]         [facility]: [phone]  [facility]: [phone]                   [facility]: [phone]   

## 2019-01-11 ENCOUNTER — Inpatient Hospital Stay: Payer: Medicare Other

## 2019-01-11 ENCOUNTER — Other Ambulatory Visit: Payer: Self-pay

## 2019-01-11 VITALS — BP 95/47 | HR 77 | Temp 97.9°F | Resp 18

## 2019-01-11 DIAGNOSIS — D469 Myelodysplastic syndrome, unspecified: Secondary | ICD-10-CM

## 2019-01-11 DIAGNOSIS — I10 Essential (primary) hypertension: Secondary | ICD-10-CM | POA: Diagnosis not present

## 2019-01-11 DIAGNOSIS — Z5111 Encounter for antineoplastic chemotherapy: Secondary | ICD-10-CM | POA: Diagnosis not present

## 2019-01-11 DIAGNOSIS — E039 Hypothyroidism, unspecified: Secondary | ICD-10-CM | POA: Diagnosis not present

## 2019-01-11 DIAGNOSIS — Z800 Family history of malignant neoplasm of digestive organs: Secondary | ICD-10-CM | POA: Diagnosis not present

## 2019-01-11 MED ORDER — ONDANSETRON HCL 8 MG PO TABS
ORAL_TABLET | ORAL | Status: AC
  Filled 2019-01-11: qty 1

## 2019-01-11 MED ORDER — ONDANSETRON HCL 8 MG PO TABS
8.0000 mg | ORAL_TABLET | Freq: Once | ORAL | Status: AC
  Administered 2019-01-11: 10:00:00 8 mg via ORAL

## 2019-01-11 MED ORDER — AZACITIDINE CHEMO SQ INJECTION
75.0000 mg/m2 | Freq: Once | INTRAMUSCULAR | Status: DC
  Filled 2019-01-11: qty 5.7

## 2019-01-11 NOTE — Progress Notes (Signed)
The [facility] today is showing incomplete for patient's vidaza. The patient was given the full dose of  the vidaza today at time shown on [facility],  and was checked off by [facility].  The nurse has been unable to rectify the incomplete showing in the [facility].

## 2019-01-12 ENCOUNTER — Inpatient Hospital Stay: Payer: Medicare Other | Attending: Hematology and Oncology

## 2019-01-12 ENCOUNTER — Other Ambulatory Visit: Payer: Self-pay

## 2019-01-12 VITALS — BP 102/58 | HR 88 | Temp 97.5°F | Resp 20

## 2019-01-12 DIAGNOSIS — Z5111 Encounter for antineoplastic chemotherapy: Secondary | ICD-10-CM | POA: Insufficient documentation

## 2019-01-12 DIAGNOSIS — D469 Myelodysplastic syndrome, unspecified: Secondary | ICD-10-CM | POA: Diagnosis not present

## 2019-01-12 MED ORDER — AZACITIDINE CHEMO SQ INJECTION
75.0000 mg/m2 | Freq: Once | INTRAMUSCULAR | Status: AC
  Administered 2019-01-12: 142.5 mg via SUBCUTANEOUS
  Filled 2019-01-12: qty 5.7

## 2019-01-12 MED ORDER — ONDANSETRON HCL 8 MG PO TABS
8.0000 mg | ORAL_TABLET | Freq: Once | ORAL | Status: AC
  Administered 2019-01-12: 8 mg via ORAL

## 2019-01-12 MED ORDER — ONDANSETRON HCL 8 MG PO TABS
ORAL_TABLET | ORAL | Status: AC
  Filled 2019-01-12: qty 1

## 2019-01-12 NOTE — Patient Instructions (Signed)
[facility] Discharge Instructions for Patients Receiving Chemotherapy  Today you received the following chemotherapy agents Azacitidine (VIDAZA).  To help prevent nausea and vomiting after your treatment, we encourage you to take your nausea medication as prescribed.   If you develop nausea and vomiting that is not controlled by your nausea medication, call the clinic.   BELOW ARE SYMPTOMS THAT SHOULD BE REPORTED IMMEDIATELY:  *FEVER GREATER THAN 100.5 F  *CHILLS WITH OR WITHOUT FEVER  NAUSEA AND VOMITING THAT IS NOT CONTROLLED WITH YOUR NAUSEA MEDICATION  *UNUSUAL SHORTNESS OF BREATH  *UNUSUAL BRUISING OR BLEEDING  TENDERNESS IN MOUTH AND THROAT WITH OR WITHOUT PRESENCE OF ULCERS  *URINARY PROBLEMS  *BOWEL PROBLEMS  UNUSUAL RASH Items with * indicate a potential emergency and should be followed up as soon as possible.  Feel free to call the clinic should you have any questions or concerns. The clinic phone number is [phone].  Please show the CHEMO ALERT CARD at check-in to the Emergency Department and triage nurse.  Coronavirus (COVID-19) Are you at risk?  Are you at risk for the Coronavirus (COVID-19)?  To be considered HIGH RISK for Coronavirus (COVID-19), you have to meet the following criteria:  . Traveled to [place], [place], [place], [place] or [place]; or in the [place] to [place], [place], [place], or [place]; and have fever, cough, and shortness of breath within the last 2 weeks of travel OR . Been in close contact with a person diagnosed with COVID-19 within the last 2 weeks and have fever, cough, and shortness of breath . IF YOU DO NOT MEET THESE CRITERIA, YOU ARE CONSIDERED LOW RISK FOR COVID-19.  What to do if you are HIGH RISK for COVID-19?  . If you are having a medical emergency, call 911. . Seek medical care right away. Before you go to a doctor's office, urgent care or emergency department, call ahead and tell them  about your recent travel, contact with someone diagnosed with COVID-19, and your symptoms. You should receive instructions from your physician's office regarding next steps of care.  . When you arrive at healthcare provider, tell the healthcare staff immediately you have returned from visiting [place], [place], [place], [place] or [place]; or traveled in the [place] to [place], [place], [place], or [place]; in the last two weeks or you have been in close contact with a person diagnosed with COVID-19 in the last 2 weeks.   . Tell the health care staff about your symptoms: fever, cough and shortness of breath. . After you have been seen by a medical provider, you will be either: o Tested for (COVID-19) and discharged home on quarantine except to seek medical care if symptoms worsen, and asked to  - Stay home and avoid contact with others until you get your results (4-5 days)  - Avoid travel on public transportation if possible (such as bus, train, or airplane) or o Sent to the Emergency Department by EMS for evaluation, COVID-19 testing, and possible admission depending on your condition and test results.  What to do if you are LOW RISK for COVID-19?  Reduce your risk of any infection by using the same precautions used for avoiding the common cold or flu:  . Wash your hands often with soap and warm water for at least 20 seconds.  If soap and water are not readily available, use an alcohol-based hand sanitizer with at least 60% alcohol.  . If coughing or   sneezing, cover your mouth and nose by coughing or sneezing into the elbow areas of your shirt or coat, into a tissue or into your sleeve (not your hands). . Avoid shaking hands with others and consider head nods or verbal greetings only. . Avoid touching your eyes, nose, or mouth with unwashed hands.  . Avoid close contact with people who are sick. . Avoid places or events with large numbers of people in one location, like concerts or  sporting events. . Carefully consider travel plans you have or are making. . If you are planning any travel outside or inside the [place], visit the CDC's Travelers' Health webpage for the latest health notices. . If you have some symptoms but not all symptoms, continue to monitor at home and seek medical attention if your symptoms worsen. . If you are having a medical emergency, call 911.   ADDITIONAL HEALTHCARE OPTIONS FOR PATIENTS  [facility] / e-Visit: [email or web address]         [facility]: [phone]  [facility]: [phone]                   [facility]: [phone]   

## 2019-01-15 ENCOUNTER — Inpatient Hospital Stay: Payer: Medicare Other

## 2019-01-15 ENCOUNTER — Other Ambulatory Visit: Payer: Self-pay | Admitting: Medical

## 2019-01-15 ENCOUNTER — Encounter (HOSPITAL_COMMUNITY): Payer: Self-pay | Admitting: Internal Medicine

## 2019-01-15 ENCOUNTER — Other Ambulatory Visit: Payer: Self-pay

## 2019-01-15 ENCOUNTER — Telehealth: Payer: Self-pay

## 2019-01-15 ENCOUNTER — Inpatient Hospital Stay: Payer: Medicare Other | Admitting: Medical

## 2019-01-15 ENCOUNTER — Inpatient Hospital Stay (HOSPITAL_BASED_OUTPATIENT_CLINIC_OR_DEPARTMENT_OTHER): Payer: Medicare Other | Admitting: Medical

## 2019-01-15 ENCOUNTER — Telehealth: Payer: Self-pay | Admitting: *Deleted

## 2019-01-15 ENCOUNTER — Other Ambulatory Visit: Payer: Self-pay | Admitting: Emergency Medicine

## 2019-01-15 VITALS — BP 114/57 | HR 99 | Temp 98.2°F | Resp 20

## 2019-01-15 DIAGNOSIS — D649 Anemia, unspecified: Secondary | ICD-10-CM

## 2019-01-15 DIAGNOSIS — D469 Myelodysplastic syndrome, unspecified: Secondary | ICD-10-CM

## 2019-01-15 DIAGNOSIS — Z5111 Encounter for antineoplastic chemotherapy: Secondary | ICD-10-CM | POA: Diagnosis not present

## 2019-01-15 LAB — CBC WITH DIFFERENTIAL (CANCER CENTER ONLY)
Abs Immature Granulocytes: 0.05 10*3/uL (ref 0.00–0.07)
Basophils Absolute: 0 10*3/uL (ref 0.0–0.1)
Basophils Relative: 0 %
Eosinophils Absolute: 0 10*3/uL (ref 0.0–0.5)
Eosinophils Relative: 0 %
HCT: 15.1 % — ABNORMAL LOW (ref 36.0–46.0)
Hemoglobin: 5.1 g/dL — CL (ref 12.0–15.0)
Immature Granulocytes: 1 %
Lymphocytes Relative: 22 %
Lymphs Abs: 0.9 10*3/uL (ref 0.7–4.0)
MCH: 37 pg — ABNORMAL HIGH (ref 26.0–34.0)
MCHC: 33.8 g/dL (ref 30.0–36.0)
MCV: 109.4 fL — ABNORMAL HIGH (ref 80.0–100.0)
Monocytes Absolute: 0.1 10*3/uL (ref 0.1–1.0)
Monocytes Relative: 2 %
Neutro Abs: 2.9 10*3/uL (ref 1.7–7.7)
Neutrophils Relative %: 75 %
Platelet Count: 102 10*3/uL — ABNORMAL LOW (ref 150–400)
RBC: 1.38 MIL/uL — ABNORMAL LOW (ref 3.87–5.11)
RDW: 23.5 % — ABNORMAL HIGH (ref 11.5–15.5)
WBC Count: 4 10*3/uL (ref 4.0–10.5)
nRBC: 0.5 % — ABNORMAL HIGH (ref 0.0–0.2)

## 2019-01-15 LAB — CMP (CANCER CENTER ONLY)
ALT: 11 U/L (ref 0–44)
AST: 9 U/L — ABNORMAL LOW (ref 15–41)
Albumin: 3.1 g/dL — ABNORMAL LOW (ref 3.5–5.0)
Alkaline Phosphatase: 49 U/L (ref 38–126)
Anion gap: 6 (ref 5–15)
BUN: 27 mg/dL — ABNORMAL HIGH (ref 8–23)
CO2: 29 mmol/L (ref 22–32)
Calcium: 8.5 mg/dL — ABNORMAL LOW (ref 8.9–10.3)
Chloride: 91 mmol/L — ABNORMAL LOW (ref 98–111)
Creatinine: 1.02 mg/dL — ABNORMAL HIGH (ref 0.44–1.00)
GFR, Est AFR Am: >60 mL/min (ref >60)
GFR, Estimated: 55 mL/min — ABNORMAL LOW (ref >60)
Glucose, Bld: 138 mg/dL — ABNORMAL HIGH (ref 70–99)
Potassium: 4.7 mmol/L (ref 3.5–5.1)
Sodium: 126 mmol/L — ABNORMAL LOW (ref 135–145)
Total Bilirubin: 1.5 mg/dL — ABNORMAL HIGH (ref 0.3–1.2)
Total Protein: 5.9 g/dL — ABNORMAL LOW (ref 6.5–8.1)

## 2019-01-15 LAB — PREPARE RBC (CROSSMATCH)

## 2019-01-15 MED ORDER — FUROSEMIDE 10 MG/ML IJ SOLN: 20.0000 mg | Freq: Once | INTRAMUSCULAR | Status: DC

## 2019-01-15 MED ORDER — FUROSEMIDE 10 MG/ML IJ SOLN
20.0000 mg | Freq: Once | INTRAMUSCULAR | Status: AC
  Administered 2019-01-15: 14:00:00 20 mg via INTRAVENOUS

## 2019-01-15 MED ORDER — FUROSEMIDE 10 MG/ML IJ SOLN
INTRAMUSCULAR | Status: AC
  Filled 2019-01-15: qty 2

## 2019-01-15 MED ORDER — ACETAMINOPHEN 325 MG PO TABS
ORAL_TABLET | ORAL | Status: AC
  Filled 2019-01-15: qty 2

## 2019-01-15 MED ORDER — DIPHENHYDRAMINE HCL 25 MG PO CAPS
25.0000 mg | ORAL_CAPSULE | Freq: Once | ORAL | Status: AC
  Administered 2019-01-15: 14:00:00 25 mg via ORAL

## 2019-01-15 MED ORDER — DIPHENHYDRAMINE HCL 25 MG PO CAPS
ORAL_CAPSULE | ORAL | Status: AC
  Filled 2019-01-15: qty 1

## 2019-01-15 MED ORDER — ACETAMINOPHEN 325 MG PO TABS
650.0000 mg | ORAL_TABLET | Freq: Once | ORAL | Status: AC
  Administered 2019-01-15: 650 mg via ORAL

## 2019-01-15 MED ORDER — FUROSEMIDE 10 MG/ML INFUSION: 20.0000 mg/h | Freq: Once | INTRAMUSCULAR | Status: DC

## 2019-01-15 MED ORDER — OMEPRAZOLE 40 MG PO CPDR: 40.0000 mg | DELAYED_RELEASE_CAPSULE | Freq: Every day | ORAL | 5 refills | Status: DC

## 2019-01-15 NOTE — Progress Notes (Signed)
Hem

## 2019-01-15 NOTE — Progress Notes (Signed)
Ms. Negrete contacted our office today stating that she was having shortness of breath and GERD.  She believes that her shortness of breath was related to her GERD.  She was not taking anything for this.  She was given a prescription for omeprazole 40 mg once daily.  She did not make our office aware that she was presenting for labs.  A CBC returned with a hemoglobin of 5.1 and a hematocrit of 15.1.  The patient was contacted and told to return to the office for transfusion of 2 units of packed red blood cells.  These were ordered.  Potentially the patient will have 1 unit today and will return for a second unit tomorrow.  She is agreeable to this management.  Sandi Mealy, MHS, PA-C Physician Assistant

## 2019-01-15 NOTE — Telephone Encounter (Signed)
Patient called and stated since starting Vidaza she has experienced indigestion and more shortness of breath due to indigestion. Sandi Mealy, PA made aware and sent in a prescription for Prilosec to patient's pharmacy. Patient verbalized understanding and will call the office with any further questions or concerns.

## 2019-01-15 NOTE — Telephone Encounter (Signed)
Call report from [facility].  "Today's Hgb = 5.1."  Called collabrative with results.

## 2019-01-15 NOTE — Telephone Encounter (Signed)
Hemoglobin 5.1. Sandi Mealy, PA made aware. Contacted patient to inform her that she needs a blood transfusion. Patient verbalized understanding and is on her way to the [facility].

## 2019-01-15 NOTE — Patient Instructions (Signed)
Blood Transfusion, Adult, Care After This sheet gives you information about how to care for yourself after your procedure. Your doctor may also give you more specific instructions. If you have problems or questions, contact your doctor. Follow these instructions at home:   Take over-the-counter and prescription medicines only as told by your doctor.  Go back to your normal activities as told by your doctor.  Follow instructions from your doctor about how to take care of the area where an IV tube was put into your vein (insertion site). Make sure you: ? Wash your hands with soap and water before you change your bandage (dressing). If there is no soap and water, use hand sanitizer. ? Change your bandage as told by your doctor.  Check your IV insertion site every day for signs of infection. Check for: ? More redness, swelling, or pain. ? More fluid or blood. ? Warmth. ? Pus or a bad smell. Contact a doctor if:  You have more redness, swelling, or pain around the IV insertion site.  You have more fluid or blood coming from the IV insertion site.  Your IV insertion site feels warm to the touch.  You have pus or a bad smell coming from the IV insertion site.  Your pee (urine) turns pink, red, or brown.  You feel weak after doing your normal activities. Get help right away if:  You have signs of a serious allergic or body defense (immune) system reaction, including: ? Itchiness. ? Hives. ? Trouble breathing. ? Anxiety. ? Pain in your chest or lower back. ? Fever, flushing, and chills. ? Fast pulse. ? Rash. ? Watery poop (diarrhea). ? Throwing up (vomiting). ? Dark pee. ? Serious headache. ? Dizziness. ? Stiff neck. ? Yellow color in your face or the white parts of your eyes (jaundice). Summary  After a blood transfusion, return to your normal activities as told by your doctor.  Every day, check for signs of infection where the IV tube was put into your vein.  Some  signs of infection are warm skin, more redness and pain, more fluid or blood, and pus or a bad smell where the needle went in.  Contact your doctor if you feel weak or have any unusual symptoms. This information is not intended to replace advice given to you by your health care provider. Make sure you discuss any questions you have with your health care provider. Document Released: 09/20/2014 Document Revised: 04/23/2016 Document Reviewed: 04/23/2016 Elsevier Interactive Patient Education  2019 [employer]   Edema  Edema is when you have too much fluid in your body or under your skin. Edema may make your legs, feet, and ankles swell up. Swelling is also common in looser tissues, like around your eyes. This is a common condition. It gets more common as you get older. There are many possible causes of edema. Eating too much salt (sodium) and being on your feet or sitting for a long time can cause edema in your legs, feet, and ankles. Hot weather may make edema worse. Edema is usually painless. Your skin may look swollen or shiny. Follow these instructions at home:  Keep the swollen body part raised (elevated) above the level of your heart when you are sitting or lying down.  Do not sit still or stand for a long time.  Do not wear tight clothes. Do not wear garters on your upper legs.  Exercise your legs. This can help the swelling go down.  Wear elastic  bandages or support stockings as told by your doctor.  Eat a low-salt (low-sodium) diet to reduce fluid as told by your doctor.  Depending on the cause of your swelling, you may need to limit how much fluid you drink (fluid restriction).  Take over-the-counter and prescription medicines only as told by your doctor. Contact a doctor if:  Treatment is not working.  You have heart, liver, or kidney disease and have symptoms of edema.  You have sudden and unexplained weight gain. Get help right away if:  You have shortness of breath  or chest pain.  You cannot breathe when you lie down.  You have pain, redness, or warmth in the swollen areas.  You have heart, liver, or kidney disease and get edema all of a sudden.  You have a fever and your symptoms get worse all of a sudden. Summary  Edema is when you have too much fluid in your body or under your skin.  Edema may make your legs, feet, and ankles swell up. Swelling is also common in looser tissues, like around your eyes.  Raise (elevate) the swollen body part above the level of your heart when you are sitting or lying down.  Follow your doctor's instructions about diet and how much fluid you can drink (fluid restriction). This information is not intended to replace advice given to you by your health care provider. Make sure you discuss any questions you have with your health care provider. Document Released: 02/16/2008 Document Revised: 09/17/2016 Document Reviewed: 09/17/2016 Elsevier Interactive Patient Education  2019 [employer].

## 2019-01-16 ENCOUNTER — Inpatient Hospital Stay: Payer: Medicare Other

## 2019-01-16 ENCOUNTER — Other Ambulatory Visit: Payer: Self-pay | Admitting: Medical

## 2019-01-16 ENCOUNTER — Inpatient Hospital Stay (HOSPITAL_BASED_OUTPATIENT_CLINIC_OR_DEPARTMENT_OTHER): Payer: Medicare Other | Admitting: Medical

## 2019-01-16 ENCOUNTER — Telehealth: Payer: Self-pay | Admitting: Medical

## 2019-01-16 ENCOUNTER — Other Ambulatory Visit: Payer: Self-pay

## 2019-01-16 DIAGNOSIS — D649 Anemia, unspecified: Secondary | ICD-10-CM

## 2019-01-16 DIAGNOSIS — D469 Myelodysplastic syndrome, unspecified: Secondary | ICD-10-CM | POA: Diagnosis not present

## 2019-01-16 DIAGNOSIS — Z5111 Encounter for antineoplastic chemotherapy: Secondary | ICD-10-CM | POA: Diagnosis not present

## 2019-01-16 LAB — BPAM RBC
Blood Product Expiration Date: 202005202359
Blood Product Expiration Date: 202005202359
ISSUE DATE / TIME: 202005041442
ISSUE DATE / TIME: 202005051517
Unit Type and Rh: 5100
Unit Type and Rh: 5100

## 2019-01-16 LAB — TYPE AND SCREEN
ABO/RH(D): O POS
Antibody Screen: NEGATIVE
Unit division: 0
Unit division: 0

## 2019-01-16 LAB — PREPARE RBC (CROSSMATCH)

## 2019-01-16 LAB — HEMOGLOBIN AND HEMATOCRIT (CANCER CENTER ONLY)
HCT: 17.9 % — ABNORMAL LOW (ref 36.0–46.0)
Hemoglobin: 6 g/dL — CL (ref 12.0–15.0)

## 2019-01-16 MED ORDER — DIPHENHYDRAMINE HCL 25 MG PO CAPS
ORAL_CAPSULE | ORAL | Status: AC
  Filled 2019-01-16: qty 1

## 2019-01-16 MED ORDER — FUROSEMIDE 10 MG/ML IJ SOLN
INTRAMUSCULAR | Status: AC
  Filled 2019-01-16: qty 2

## 2019-01-16 MED ORDER — ACETAMINOPHEN 325 MG PO TABS
650.0000 mg | ORAL_TABLET | Freq: Once | ORAL | Status: AC
  Administered 2019-01-16: 14:00:00 650 mg via ORAL

## 2019-01-16 MED ORDER — FUROSEMIDE 10 MG/ML IJ SOLN
20.0000 mg | Freq: Once | INTRAMUSCULAR | Status: AC
  Administered 2019-01-16: 14:00:00 20 mg via INTRAVENOUS

## 2019-01-16 MED ORDER — DIPHENHYDRAMINE HCL 25 MG PO CAPS
25.0000 mg | ORAL_CAPSULE | Freq: Once | ORAL | Status: AC
  Administered 2019-01-16: 14:00:00 25 mg via ORAL

## 2019-01-16 MED ORDER — SODIUM CHLORIDE 0.9% IV SOLUTION
250.0000 mL | Freq: Once | INTRAVENOUS | Status: AC
  Administered 2019-01-16: 14:00:00 250 mL via INTRAVENOUS
  Filled 2019-01-16: qty 250

## 2019-01-16 MED ORDER — ACETAMINOPHEN 325 MG PO TABS
ORAL_TABLET | ORAL | Status: AC
  Filled 2019-01-16: qty 2

## 2019-01-16 NOTE — Patient Instructions (Signed)
Blood Transfusion, Adult, Care After This sheet gives you information about how to care for yourself after your procedure. Your doctor may also give you more specific instructions. If you have problems or questions, contact your doctor. Follow these instructions at home:   Take over-the-counter and prescription medicines only as told by your doctor.  Go back to your normal activities as told by your doctor.  Follow instructions from your doctor about how to take care of the area where an IV tube was put into your vein (insertion site). Make sure you: ? Wash your hands with soap and water before you change your bandage (dressing). If there is no soap and water, use hand sanitizer. ? Change your bandage as told by your doctor.  Check your IV insertion site every day for signs of infection. Check for: ? More redness, swelling, or pain. ? More fluid or blood. ? Warmth. ? Pus or a bad smell. Contact a doctor if:  You have more redness, swelling, or pain around the IV insertion site.  You have more fluid or blood coming from the IV insertion site.  Your IV insertion site feels warm to the touch.  You have pus or a bad smell coming from the IV insertion site.  Your pee (urine) turns pink, red, or brown.  You feel weak after doing your normal activities. Get help right away if:  You have signs of a serious allergic or body defense (immune) system reaction, including: ? Itchiness. ? Hives. ? Trouble breathing. ? Anxiety. ? Pain in your chest or lower back. ? Fever, flushing, and chills. ? Fast pulse. ? Rash. ? Watery poop (diarrhea). ? Throwing up (vomiting). ? Dark pee. ? Serious headache. ? Dizziness. ? Stiff neck. ? Yellow color in your face or the white parts of your eyes (jaundice). Summary  After a blood transfusion, return to your normal activities as told by your doctor.  Every day, check for signs of infection where the IV tube was put into your vein.  Some  signs of infection are warm skin, more redness and pain, more fluid or blood, and pus or a bad smell where the needle went in.  Contact your doctor if you feel weak or have any unusual symptoms. This information is not intended to replace advice given to you by your health care provider. Make sure you discuss any questions you have with your health care provider. Document Released: 09/20/2014 Document Revised: 04/23/2016 Document Reviewed: 04/23/2016 Elsevier Interactive Patient Education  2019 [employer]   Edema  Edema is when you have too much fluid in your body or under your skin. Edema may make your legs, feet, and ankles swell up. Swelling is also common in looser tissues, like around your eyes. This is a common condition. It gets more common as you get older. There are many possible causes of edema. Eating too much salt (sodium) and being on your feet or sitting for a long time can cause edema in your legs, feet, and ankles. Hot weather may make edema worse. Edema is usually painless. Your skin may look swollen or shiny. Follow these instructions at home:  Keep the swollen body part raised (elevated) above the level of your heart when you are sitting or lying down.  Do not sit still or stand for a long time.  Do not wear tight clothes. Do not wear garters on your upper legs.  Exercise your legs. This can help the swelling go down.  Wear elastic  bandages or support stockings as told by your doctor.  Eat a low-salt (low-sodium) diet to reduce fluid as told by your doctor.  Depending on the cause of your swelling, you may need to limit how much fluid you drink (fluid restriction).  Take over-the-counter and prescription medicines only as told by your doctor. Contact a doctor if:  Treatment is not working.  You have heart, liver, or kidney disease and have symptoms of edema.  You have sudden and unexplained weight gain. Get help right away if:  You have shortness of breath  or chest pain.  You cannot breathe when you lie down.  You have pain, redness, or warmth in the swollen areas.  You have heart, liver, or kidney disease and get edema all of a sudden.  You have a fever and your symptoms get worse all of a sudden. Summary  Edema is when you have too much fluid in your body or under your skin.  Edema may make your legs, feet, and ankles swell up. Swelling is also common in looser tissues, like around your eyes.  Raise (elevate) the swollen body part above the level of your heart when you are sitting or lying down.  Follow your doctor's instructions about diet and how much fluid you can drink (fluid restriction). This information is not intended to replace advice given to you by your health care provider. Make sure you discuss any questions you have with your health care provider. Document Released: 02/16/2008 Document Revised: 09/17/2016 Document Reviewed: 09/17/2016 Elsevier Interactive Patient Education  2019 [employer].

## 2019-01-16 NOTE — Telephone Encounter (Signed)
No los per 5/4. °

## 2019-01-17 LAB — BPAM RBC
Blood Product Expiration Date: 202005202359
ISSUE DATE / TIME: 202005051517
Unit Type and Rh: 5100

## 2019-01-17 LAB — TYPE AND SCREEN
ABO/RH(D): O POS
Antibody Screen: NEGATIVE
Unit division: 0

## 2019-01-18 ENCOUNTER — Telehealth: Payer: Self-pay

## 2019-01-18 ENCOUNTER — Ambulatory Visit (INDEPENDENT_AMBULATORY_CARE_PROVIDER_SITE_OTHER): Payer: Medicare Other | Admitting: Internal Medicine

## 2019-01-18 ENCOUNTER — Telehealth: Payer: Self-pay | Admitting: *Deleted

## 2019-01-18 ENCOUNTER — Encounter: Payer: Self-pay | Admitting: Internal Medicine

## 2019-01-18 ENCOUNTER — Other Ambulatory Visit: Payer: Self-pay | Admitting: Internal Medicine

## 2019-01-18 DIAGNOSIS — D469 Myelodysplastic syndrome, unspecified: Secondary | ICD-10-CM | POA: Diagnosis not present

## 2019-01-18 DIAGNOSIS — I428 Other cardiomyopathies: Secondary | ICD-10-CM | POA: Diagnosis not present

## 2019-01-18 DIAGNOSIS — I429 Cardiomyopathy, unspecified: Secondary | ICD-10-CM

## 2019-01-18 DIAGNOSIS — D649 Anemia, unspecified: Secondary | ICD-10-CM

## 2019-01-18 DIAGNOSIS — D539 Nutritional anemia, unspecified: Secondary | ICD-10-CM

## 2019-01-18 DIAGNOSIS — R0602 Shortness of breath: Secondary | ICD-10-CM | POA: Diagnosis not present

## 2019-01-18 NOTE — Progress Notes (Signed)
   Subjective:    Patient ID: [name], female    DOB: 1945/11/18, 73 y.o.   MRN: [id number]  HPI 73 year old Female with recently diagnosed myelodysplasia with anemia seen today by interactive audio and video telecommunications regarding shortness of breath.  She is identified as [name] using 2 identifiers and is a longstanding patient in this practice.  She is seen by interactive audio and video telecommunications due to the coronavirus pandemic and is agreeable to visit in this format today.  Currently under the care of Dr. Walden Field at  [facility]  Patient has a history of nonischemic cardiomyopathy and is followed by Cardiology.  She has a history of asthma and asthmatic bronchitis with intermittent exacerbations of wheezing usually due to viral respiratory infections  On May 5 received a blood transfusion due to hemoglobin of 5.1 g on May 4.  MCV was 109.4, platelet count 102,000 and white blood cell count 4000.  Despite blood transfusion patient is feeling short of breath.   Review of Systems denies chest pain     Objective:   Physical Exam  Unable to examine patient today but she does appear to be short of breath when being interviewed regarding her symptoms today.  Recommended chest x-ray today but patient says that she feels so poorly that she does not feel like getting out to go get a chest x-ray at outpatient radiology.  She agrees to do this on Monday, May 11.  Patient advised to go to emergency department if symptoms are not improving.  I am concerned that she may need another transfusion.      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  I am concerned that she may need to go to the emergency department over the weekend for symptomatic shortness of breath  likely due to anemia from myelodysplasia.  There is no reason to think she has active GI bleeding causing the symptoms.  She has had a thorough GI work-up.  20 minutes spent reviewing records and speaking with patient

## 2019-01-18 NOTE — Progress Notes (Signed)
The patient presents for a second unit of packed red blood cells today.  She had 1 unit of packed red blood cells yesterday.  His CBC returned today at 6.0.  We will proceed with transfusion of unit #2 of packed red blood cells.  She will return on 01/25/2019 for a follow-up appointment with Dr. Walden Field.  Sandi Mealy, MHS, PA-C Physician Assistant

## 2019-01-18 NOTE — Telephone Encounter (Signed)
Pt called wanting to know results of Hgb post second unit of blood transfusion on 01/16/19.   Informed pt that hgb was not rechecked after second transfusion.  However, pt has lab and office visit with Dr. Walden Field  on 01/25/19.  Pt was aware of appts. Pt also wanted to know if Dr. Walden Field  Would order oxygen;  Stated she had heart condition that contributes to her shortness of breath - happens only when pt stands up after sitting - has to wait about 10 min. Before she could walk. Denied shortness of breath at rest. Instructed pt to contact either her PCP or cardiologist for above symptoms.   Informed pt that she can also  discuss the concerned issue with Dr. Walden Field at next visit on 01/25/19. Pt voiced understanding. Pt's    Phone    [phone].

## 2019-01-18 NOTE — Telephone Encounter (Signed)
Patient called states she is having trouble breathing and she had blood transfusions on Monday and Tuesday. She would like to speak with you about getting an oxygen tank for home. She said she has SOB with exertion.

## 2019-01-18 NOTE — Telephone Encounter (Signed)
Set up virtual visit 

## 2019-01-18 NOTE — Telephone Encounter (Signed)
Scheduled Virtual Visit °

## 2019-01-22 ENCOUNTER — Inpatient Hospital Stay (HOSPITAL_COMMUNITY)
Admission: EM | Admit: 2019-01-22 | Discharge: 2019-02-08 | DRG: 291 | Disposition: A | Discharge status: Skilled Nursing Facility | Payer: Medicare Other | Source: Ambulatory Visit | Attending: Internal Medicine | Admitting: Internal Medicine

## 2019-01-22 ENCOUNTER — Other Ambulatory Visit: Payer: Self-pay | Admitting: Internal Medicine

## 2019-01-22 ENCOUNTER — Ambulatory Visit
Admission: RE | Admit: 2019-01-22 | Discharge: 2019-01-22 | Disposition: A | Discharge status: Home / Self Care | Payer: Medicare Other | Source: Ambulatory Visit | Attending: Internal Medicine | Admitting: Internal Medicine

## 2019-01-22 ENCOUNTER — Other Ambulatory Visit: Payer: Self-pay

## 2019-01-22 ENCOUNTER — Encounter (HOSPITAL_COMMUNITY): Payer: Self-pay | Admitting: Emergency Medicine

## 2019-01-22 ENCOUNTER — Encounter: Payer: Self-pay | Admitting: Internal Medicine

## 2019-01-22 ENCOUNTER — Ambulatory Visit (INDEPENDENT_AMBULATORY_CARE_PROVIDER_SITE_OTHER): Payer: Medicare Other | Admitting: Internal Medicine

## 2019-01-22 DIAGNOSIS — I5023 Acute on chronic systolic (congestive) heart failure: Secondary | ICD-10-CM

## 2019-01-22 DIAGNOSIS — Z79899 Other long term (current) drug therapy: Secondary | ICD-10-CM | POA: Diagnosis not present

## 2019-01-22 DIAGNOSIS — D649 Anemia, unspecified: Secondary | ICD-10-CM

## 2019-01-22 DIAGNOSIS — D469 Myelodysplastic syndrome, unspecified: Secondary | ICD-10-CM | POA: Diagnosis present

## 2019-01-22 DIAGNOSIS — I5022 Chronic systolic (congestive) heart failure: Secondary | ICD-10-CM | POA: Diagnosis not present

## 2019-01-22 DIAGNOSIS — R0902 Hypoxemia: Secondary | ICD-10-CM

## 2019-01-22 DIAGNOSIS — J9690 Respiratory failure, unspecified, unspecified whether with hypoxia or hypercapnia: Secondary | ICD-10-CM

## 2019-01-22 DIAGNOSIS — J9601 Acute respiratory failure with hypoxia: Secondary | ICD-10-CM | POA: Diagnosis not present

## 2019-01-22 DIAGNOSIS — I10 Essential (primary) hypertension: Secondary | ICD-10-CM | POA: Diagnosis not present

## 2019-01-22 DIAGNOSIS — R0600 Dyspnea, unspecified: Secondary | ICD-10-CM | POA: Diagnosis not present

## 2019-01-22 DIAGNOSIS — I509 Heart failure, unspecified: Secondary | ICD-10-CM | POA: Diagnosis not present

## 2019-01-22 DIAGNOSIS — Z86010 Personal history of colonic polyps: Secondary | ICD-10-CM

## 2019-01-22 DIAGNOSIS — Z7401 Bed confinement status: Secondary | ICD-10-CM | POA: Diagnosis not present

## 2019-01-22 DIAGNOSIS — I959 Hypotension, unspecified: Secondary | ICD-10-CM | POA: Diagnosis not present

## 2019-01-22 DIAGNOSIS — E039 Hypothyroidism, unspecified: Secondary | ICD-10-CM | POA: Diagnosis present

## 2019-01-22 DIAGNOSIS — E874 Mixed disorder of acid-base balance: Secondary | ICD-10-CM | POA: Diagnosis present

## 2019-01-22 DIAGNOSIS — R9389 Abnormal findings on diagnostic imaging of other specified body structures: Secondary | ICD-10-CM | POA: Diagnosis not present

## 2019-01-22 DIAGNOSIS — R5381 Other malaise: Secondary | ICD-10-CM | POA: Diagnosis not present

## 2019-01-22 DIAGNOSIS — I5021 Acute systolic (congestive) heart failure: Secondary | ICD-10-CM | POA: Diagnosis not present

## 2019-01-22 DIAGNOSIS — J440 Chronic obstructive pulmonary disease with acute lower respiratory infection: Secondary | ICD-10-CM | POA: Diagnosis present

## 2019-01-22 DIAGNOSIS — J90 Pleural effusion, not elsewhere classified: Secondary | ICD-10-CM | POA: Diagnosis not present

## 2019-01-22 DIAGNOSIS — M2550 Pain in unspecified joint: Secondary | ICD-10-CM | POA: Diagnosis not present

## 2019-01-22 DIAGNOSIS — E1151 Type 2 diabetes mellitus with diabetic peripheral angiopathy without gangrene: Secondary | ICD-10-CM | POA: Diagnosis not present

## 2019-01-22 DIAGNOSIS — I429 Cardiomyopathy, unspecified: Secondary | ICD-10-CM

## 2019-01-22 DIAGNOSIS — I428 Other cardiomyopathies: Secondary | ICD-10-CM | POA: Diagnosis not present

## 2019-01-22 DIAGNOSIS — J810 Acute pulmonary edema: Secondary | ICD-10-CM | POA: Diagnosis not present

## 2019-01-22 DIAGNOSIS — M6281 Muscle weakness (generalized): Secondary | ICD-10-CM | POA: Diagnosis not present

## 2019-01-22 DIAGNOSIS — Z6838 Body mass index (BMI) 38.0-38.9, adult: Secondary | ICD-10-CM

## 2019-01-22 DIAGNOSIS — R41841 Cognitive communication deficit: Secondary | ICD-10-CM | POA: Diagnosis not present

## 2019-01-22 DIAGNOSIS — D61818 Other pancytopenia: Secondary | ICD-10-CM | POA: Diagnosis present

## 2019-01-22 DIAGNOSIS — E119 Type 2 diabetes mellitus without complications: Secondary | ICD-10-CM | POA: Diagnosis present

## 2019-01-22 DIAGNOSIS — R0602 Shortness of breath: Secondary | ICD-10-CM

## 2019-01-22 DIAGNOSIS — I5043 Acute on chronic combined systolic (congestive) and diastolic (congestive) heart failure: Secondary | ICD-10-CM | POA: Diagnosis present

## 2019-01-22 DIAGNOSIS — R1312 Dysphagia, oropharyngeal phase: Secondary | ICD-10-CM | POA: Diagnosis not present

## 2019-01-22 DIAGNOSIS — E878 Other disorders of electrolyte and fluid balance, not elsewhere classified: Secondary | ICD-10-CM | POA: Diagnosis present

## 2019-01-22 DIAGNOSIS — D539 Nutritional anemia, unspecified: Secondary | ICD-10-CM | POA: Diagnosis not present

## 2019-01-22 DIAGNOSIS — I361 Nonrheumatic tricuspid (valve) insufficiency: Secondary | ICD-10-CM | POA: Diagnosis not present

## 2019-01-22 DIAGNOSIS — E876 Hypokalemia: Secondary | ICD-10-CM | POA: Diagnosis present

## 2019-01-22 DIAGNOSIS — I447 Left bundle-branch block, unspecified: Secondary | ICD-10-CM | POA: Diagnosis present

## 2019-01-22 DIAGNOSIS — D638 Anemia in other chronic diseases classified elsewhere: Secondary | ICD-10-CM | POA: Diagnosis present

## 2019-01-22 DIAGNOSIS — J984 Other disorders of lung: Secondary | ICD-10-CM | POA: Diagnosis not present

## 2019-01-22 DIAGNOSIS — I110 Hypertensive heart disease with heart failure: Secondary | ICD-10-CM | POA: Diagnosis not present

## 2019-01-22 DIAGNOSIS — K219 Gastro-esophageal reflux disease without esophagitis: Secondary | ICD-10-CM | POA: Diagnosis not present

## 2019-01-22 DIAGNOSIS — Z20828 Contact with and (suspected) exposure to other viral communicable diseases: Secondary | ICD-10-CM | POA: Diagnosis present

## 2019-01-22 DIAGNOSIS — J189 Pneumonia, unspecified organism: Secondary | ICD-10-CM | POA: Diagnosis present

## 2019-01-22 DIAGNOSIS — R29898 Other symptoms and signs involving the musculoskeletal system: Secondary | ICD-10-CM | POA: Diagnosis not present

## 2019-01-22 DIAGNOSIS — Z66 Do not resuscitate: Secondary | ICD-10-CM | POA: Diagnosis not present

## 2019-01-22 DIAGNOSIS — Z79890 Hormone replacement therapy (postmenopausal): Secondary | ICD-10-CM

## 2019-01-22 DIAGNOSIS — Z9981 Dependence on supplemental oxygen: Secondary | ICD-10-CM | POA: Diagnosis not present

## 2019-01-22 DIAGNOSIS — I420 Dilated cardiomyopathy: Secondary | ICD-10-CM | POA: Diagnosis present

## 2019-01-22 DIAGNOSIS — E669 Obesity, unspecified: Secondary | ICD-10-CM | POA: Diagnosis present

## 2019-01-22 DIAGNOSIS — I340 Nonrheumatic mitral (valve) insufficiency: Secondary | ICD-10-CM | POA: Diagnosis not present

## 2019-01-22 DIAGNOSIS — E538 Deficiency of other specified B group vitamins: Secondary | ICD-10-CM | POA: Diagnosis not present

## 2019-01-22 DIAGNOSIS — J441 Chronic obstructive pulmonary disease with (acute) exacerbation: Secondary | ICD-10-CM | POA: Diagnosis present

## 2019-01-22 DIAGNOSIS — Z95810 Presence of automatic (implantable) cardiac defibrillator: Secondary | ICD-10-CM

## 2019-01-22 DIAGNOSIS — I472 Ventricular tachycardia: Secondary | ICD-10-CM | POA: Diagnosis present

## 2019-01-22 DIAGNOSIS — E785 Hyperlipidemia, unspecified: Secondary | ICD-10-CM | POA: Diagnosis present

## 2019-01-22 DIAGNOSIS — I517 Cardiomegaly: Secondary | ICD-10-CM | POA: Diagnosis not present

## 2019-01-22 DIAGNOSIS — Z8249 Family history of ischemic heart disease and other diseases of the circulatory system: Secondary | ICD-10-CM

## 2019-01-22 DIAGNOSIS — R918 Other nonspecific abnormal finding of lung field: Secondary | ICD-10-CM | POA: Diagnosis not present

## 2019-01-22 LAB — COMPREHENSIVE METABOLIC PANEL
ALT: 21 U/L (ref 0–44)
AST: 11 U/L — ABNORMAL LOW (ref 15–41)
Albumin: 3.1 g/dL — ABNORMAL LOW (ref 3.5–5.0)
Alkaline Phosphatase: 55 U/L (ref 38–126)
Anion gap: 8 (ref 5–15)
BUN: 22 mg/dL (ref 8–23)
CO2: 31 mmol/L (ref 22–32)
Calcium: 8.5 mg/dL — ABNORMAL LOW (ref 8.9–10.3)
Chloride: 93 mmol/L — ABNORMAL LOW (ref 98–111)
Creatinine, Ser: 1.03 mg/dL — ABNORMAL HIGH (ref 0.44–1.00)
GFR calc Af Amer: >60 mL/min (ref >60)
GFR calc non Af Amer: 54 mL/min — ABNORMAL LOW (ref >60)
Glucose, Bld: 122 mg/dL — ABNORMAL HIGH (ref 70–99)
Potassium: 3.6 mmol/L (ref 3.5–5.1)
Sodium: 132 mmol/L — ABNORMAL LOW (ref 135–145)
Total Bilirubin: 2 mg/dL — ABNORMAL HIGH (ref 0.3–1.2)
Total Protein: 6.1 g/dL — ABNORMAL LOW (ref 6.5–8.1)

## 2019-01-22 LAB — URINALYSIS, ROUTINE W REFLEX MICROSCOPIC
Bilirubin Urine: NEGATIVE
Glucose, UA: NEGATIVE mg/dL
Hgb urine dipstick: NEGATIVE
Ketones, ur: NEGATIVE mg/dL
Leukocytes,Ua: NEGATIVE
Nitrite: NEGATIVE
Protein, ur: NEGATIVE mg/dL
Specific Gravity, Urine: 1.003 — ABNORMAL LOW (ref 1.005–1.030)
pH: 5 (ref 5.0–8.0)

## 2019-01-22 LAB — CBC WITH DIFFERENTIAL/PLATELET
Abs Immature Granulocytes: 0.03 10*3/uL (ref 0.00–0.07)
Basophils Absolute: 0 10*3/uL (ref 0.0–0.1)
Basophils Relative: 0 %
Eosinophils Absolute: 0 10*3/uL (ref 0.0–0.5)
Eosinophils Relative: 0 %
HCT: 19 % — ABNORMAL LOW (ref 36.0–46.0)
Hemoglobin: 6.1 g/dL — CL (ref 12.0–15.0)
Immature Granulocytes: 1 %
Lymphocytes Relative: 27 %
Lymphs Abs: 1 10*3/uL (ref 0.7–4.0)
MCH: 34.3 pg — ABNORMAL HIGH (ref 26.0–34.0)
MCHC: 32.1 g/dL (ref 30.0–36.0)
MCV: 106.7 fL — ABNORMAL HIGH (ref 80.0–100.0)
Monocytes Absolute: 0.4 10*3/uL (ref 0.1–1.0)
Monocytes Relative: 11 %
Neutro Abs: 2.3 10*3/uL (ref 1.7–7.7)
Neutrophils Relative %: 61 %
Platelets: 65 10*3/uL — ABNORMAL LOW (ref 150–400)
RBC: 1.78 MIL/uL — ABNORMAL LOW (ref 3.87–5.11)
RDW: 23.3 % — ABNORMAL HIGH (ref 11.5–15.5)
WBC: 3.8 10*3/uL — ABNORMAL LOW (ref 4.0–10.5)
nRBC: 6.9 % — ABNORMAL HIGH (ref 0.0–0.2)

## 2019-01-22 LAB — ABO/RH: ABO/RH(D): O POS

## 2019-01-22 LAB — TROPONIN I: Troponin I: <0.03 ng/mL (ref <0.03)

## 2019-01-22 LAB — LACTIC ACID, PLASMA
Lactic Acid, Venous: 1.4 mmol/L (ref 0.5–1.9)
Lactic Acid, Venous: 0.9 mmol/L (ref 0.5–1.9)

## 2019-01-22 LAB — SARS CORONAVIRUS 2 BY RT PCR (HOSPITAL ORDER, PERFORMED IN ~~LOC~~ HOSPITAL LAB): SARS Coronavirus 2: NEGATIVE

## 2019-01-22 LAB — HEMOGLOBIN AND HEMATOCRIT, BLOOD
HCT: 22.4 % — ABNORMAL LOW (ref 36.0–46.0)
Hemoglobin: 7 g/dL — ABNORMAL LOW (ref 12.0–15.0)

## 2019-01-22 LAB — BRAIN NATRIURETIC PEPTIDE: B Natriuretic Peptide: 929.8 pg/mL — ABNORMAL HIGH (ref 0.0–100.0)

## 2019-01-22 LAB — PREPARE RBC (CROSSMATCH)

## 2019-01-22 MED ORDER — SODIUM CHLORIDE 0.9 % IV SOLN
1.0000 g | INTRAVENOUS | Status: DC
  Administered 2019-01-22 – 2019-01-30 (×9): 1 g via INTRAVENOUS
  Filled 2019-01-22 (×6): qty 1
  Filled 2019-01-22 (×2): qty 10
  Filled 2019-01-22: qty 1

## 2019-01-22 MED ORDER — SODIUM CHLORIDE 0.9% FLUSH: 3.0000 mL | Freq: Once | INTRAVENOUS | Status: DC

## 2019-01-22 MED ORDER — SODIUM CHLORIDE 0.9 % IV SOLN: INTRAVENOUS | Status: DC

## 2019-01-22 MED ORDER — SODIUM CHLORIDE 0.9 % IV SOLN
10.0000 mL/h | Freq: Once | INTRAVENOUS | Status: AC
  Administered 2019-01-29: 20:00:00 10 mL/h via INTRAVENOUS

## 2019-01-22 MED ORDER — HEPARIN SOD (PORK) LOCK FLUSH 100 UNIT/ML IV SOLN: 500.0000 [IU] | Freq: Every day | INTRAVENOUS | Status: DC | PRN

## 2019-01-22 MED ORDER — SODIUM CHLORIDE 0.9 % IV SOLN
500.0000 mg | Freq: Once | INTRAVENOUS | Status: AC
  Administered 2019-01-22: 14:00:00 500 mg via INTRAVENOUS
  Filled 2019-01-22: qty 500

## 2019-01-22 MED ORDER — IPRATROPIUM-ALBUTEROL 0.5-2.5 (3) MG/3ML IN SOLN
3.0000 mL | Freq: Three times a day (TID) | RESPIRATORY_TRACT | Status: DC
  Administered 2019-01-23 – 2019-01-27 (×13): 3 mL via RESPIRATORY_TRACT
  Filled 2019-01-22 (×13): qty 3

## 2019-01-22 MED ORDER — SODIUM CHLORIDE 0.9 % IV SOLN
INTRAVENOUS | Status: AC
  Administered 2019-01-22 (×2): via INTRAVENOUS

## 2019-01-22 MED ORDER — SODIUM CHLORIDE 0.9 % IV SOLN
1.0000 g | Freq: Once | INTRAVENOUS | Status: DC
  Filled 2019-01-22: qty 10

## 2019-01-22 MED ORDER — ORAL CARE MOUTH RINSE
15.0000 mL | Freq: Two times a day (BID) | OROMUCOSAL | Status: DC
  Administered 2019-01-22 – 2019-02-08 (×27): 15 mL via OROMUCOSAL

## 2019-01-22 MED ORDER — SODIUM CHLORIDE 0.9 % IV SOLN
500.0000 mg | INTRAVENOUS | Status: DC
  Administered 2019-01-23 – 2019-01-24 (×2): 500 mg via INTRAVENOUS
  Filled 2019-01-22 (×3): qty 500

## 2019-01-22 MED ORDER — ALBUTEROL SULFATE (2.5 MG/3ML) 0.083% IN NEBU
2.5000 mg | INHALATION_SOLUTION | Freq: Four times a day (QID) | RESPIRATORY_TRACT | Status: DC
  Administered 2019-01-22: 20:00:00 2.5 mg via RESPIRATORY_TRACT
  Filled 2019-01-22: qty 3

## 2019-01-22 MED ORDER — ALBUTEROL SULFATE (2.5 MG/3ML) 0.083% IN NEBU: 2.5000 mg | INHALATION_SOLUTION | Freq: Four times a day (QID) | RESPIRATORY_TRACT | Status: DC | PRN

## 2019-01-22 NOTE — Plan of Care (Signed)

## 2019-01-22 NOTE — ED Notes (Signed)
Attempted to call report to floor RN room 1444

## 2019-01-22 NOTE — ED Notes (Signed)
Wells Guiles primary RN aware unit of blood in blood bank ready.

## 2019-01-22 NOTE — ED Notes (Signed)
Bed: WA08 Expected date:  Expected time:  Means of arrival:  Comments: 

## 2019-01-22 NOTE — ED Triage Notes (Addendum)
Sent by from MD related to pneumonia from on imaging. Pt receives chemotherapy for diagnosis of MDS. Pt hypoxic on arrival; RA oxygen noted 80%. Placed on 2 lpm [place] and now saturation of 96%. SOB with exertion noted.

## 2019-01-22 NOTE — Progress Notes (Signed)
   Subjective:    Patient ID: [name], female    DOB: 22-Jun-1946, 73 y.o.   MRN: [id number]  HPI 73 year old female recently diagnosed with myelodysplasia being followed by oncology.  Patient had transfusion last week after hemoglobin on May 4 was 5.1 g.  She felt better after transfusion the call Friday to say she was feeling short of breath.  We asked her to come in on Friday but she did not feel up to it.  She wanted to wait and come today.  We also ordered a chest x-ray due to complaint of shortness of breath.  X-ray today shows multifocal airspace consolidation suspicious for multifocal pneumonia.  Seen in her car today.  25 minutes spent during this encounter with coordination of care, interpretation of x-ray, seeing patient in auto      Review of Systems     Objective:   Physical Exam She is seen in her car today. She is too weak to get out of the car.  She has a driver.  She looks pale.  Says she cannot get a deep breath.  Her pulse oximetry on room air is 98%.  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  No blood was drawn today here.

## 2019-01-22 NOTE — ED Provider Notes (Signed)
[facility] DEPT Provider Note   CSN: [id number] Arrival date & time: 01/22/19  1233    History   Chief Complaint Chief Complaint  Patient presents with  . Pneumonia  . Other    hypoxia    HPI [name] is a 73 y.o. female.     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  Started over the last week or so since she has been started on treatment for the MDS.  This is included blood transfusions.  She is also getting Azacitidine.  Shortness of breath has progressively worsened.  She can barely do anything with exertion because of the shortness of breath.  She does have congestive heart failure but states she is only had some ankle swelling and no significant leg swelling.  No cough or fever.  No chest pain.  Went to her doctor's office where chest x-ray showed concern for pneumonia and she was sent here.  No significant sick contacts or concerns for COVID-19.  On arrival to the ER, initial O2 sat 80% and she was placed on 2 L with good response.  She feels better with the oxygen supplementation.  Past Medical History:  Diagnosis Date  . Automatic implantable cardioverter-defibrillator in situ    Dr. Beckie Salts follows-next visit- [phone]  . Cataract   . Diverticulosis   . Dyslipidemia   . Ejection fraction < 50%    EF 25-30%, September, 2010  . GERD (gastroesophageal reflux disease)   . Hypothyroidism   . ICD (implantable cardiac defibrillator) in place    10/2009; Dr. Lovena Le  . Iron deficiency 09/21/2018  . LBBB (left bundle branch block)    old  . Macrocytic anemia   . MDS (myelodysplastic syndrome) ([place]) 12/27/2018  . Mitral regurgitation    Mild, echo, September, 2010  . Nonischemic cardiomyopathy ([place])    Normal coronary arteries, catheterization, September, 2009  . Overweight(278.02)   . Serrated polyp of colon   . Systolic CHF, chronic (HCC)    no recent problems    Patient  Active Problem List   Diagnosis Date Noted  . Multifocal pneumonia 01/22/2019  . Acute respiratory failure with hypoxia ([place])   . MDS (myelodysplastic syndrome) ([place]) 12/27/2018  . Iron deficiency 09/21/2018  . Symptomatic anemia 08/03/2018  . Impaired glucose tolerance 06/21/2017  . Cough variant asthma  vs UACS from ACEi  12/31/2016  . Essential hypertension 12/31/2016  . Morbid obesity due to excess calories ([place]) 12/31/2016  . History of colonic polyps   . Benign neoplasm of descending colon   . Diverticulosis of colon without hemorrhage 01/28/2014  . Serrated adenoma of colon 01/28/2014  . Nonspecific abnormal finding in stool contents 11/29/2013  . Colon cancer screening 11/29/2013  . History of depression 08/11/2012  . Chronic systolic CHF (congestive heart failure) ([place])   . Systolic CHF, chronic ([place])   . LBBB (left bundle branch block)   . Automatic implantable cardioverter-defibrillator in situ   . Hypothyroidism   . GERD (gastroesophageal reflux disease)   . Dyslipidemia   . Mitral regurgitation   . Overweight   . Ejection fraction < 50%     Past Surgical History:  Procedure Laterality Date  . BIOPSY  08/07/2018   Procedure: BIOPSY;  Surgeon: Jerene Bears, MD;  Location: [facility];  Service: Gastroenterology;;  . CARDIAC CATHETERIZATION     '09 last  . [facility]    .  COLONOSCOPY N/A 11/29/2013   Procedure: COLONOSCOPY;  Surgeon: Jerene Bears, MD;  Location: [facility];  Service: Gastroenterology;  Laterality: N/A;  . COLONOSCOPY N/A 02/25/2015   Procedure: COLONOSCOPY;  Surgeon: Jerene Bears, MD;  Location: [facility];  Service: Gastroenterology;  Laterality: N/A;  . ESOPHAGOGASTRODUODENOSCOPY (EGD) WITH PROPOFOL N/A 08/07/2018   Procedure: ESOPHAGOGASTRODUODENOSCOPY (EGD) WITH PROPOFOL;  Surgeon: Jerene Bears, MD;  Location: [facility];  Service: Gastroenterology;  Laterality: N/A;  . EYE SURGERY Left    x 3-"droopy eyelid"  .  heart catherization    . TONSILLECTOMY  1952     OB History   No obstetric history on file.      Home Medications    Prior to Admission medications   Medication Sig Start Date End Date Taking? Authorizing Provider  albuterol (PROVENTIL HFA;VENTOLIN HFA) 108 (90 Base) MCG/ACT inhaler Inhale 2 puffs into the lungs every 6 (six) hours as needed for wheezing or shortness of breath. 11/23/16  Yes Baxley, Cresenciano Lick, MD  carvedilol (COREG) 25 MG tablet TAKE 1 TABLET BY MOUTH TWICE A DAY Patient taking differently: Take 25 mg by mouth 2 (two) times daily with a meal.  04/27/18  Yes Nahser, Wonda Cheng, MD  Cyanocobalamin (VITAMIN B-12 PO) Take by mouth. gummies   Yes [provider]  folic acid (FOLVITE) 1 MG tablet Take 1 tablet (1 mg total) by mouth daily. 11/24/18  Yes Baxley, Cresenciano Lick, MD  furosemide (LASIX) 40 MG tablet TAKE 1 TABLET BY MOUTH EVERY DAY Patient taking differently: Take 80 mg by mouth daily.  04/27/18  Yes Nahser, Wonda Cheng, MD  glucose blood (ACCU-CHEK AVIVA) test strip Check daily.  For Diabetes 250.00 05/29/14  Yes Baxley, Cresenciano Lick, MD  ipratropium-albuterol (DUONEB) 0.5-2.5 (3) MG/3ML SOLN Take 3 mLs by nebulization every 4 (four) hours as needed (shortness of breath or wheezing).   Yes [provider]  Lancets MISC Check glucose daily.  Diabetes 250.00 05/29/14  Yes Baxley, Cresenciano Lick, MD  levothyroxine (SYNTHROID, LEVOTHROID) 150 MCG tablet TAKE 1 TABLET BY MOUTH EVERY DAY Patient taking differently: Take 150 mcg by mouth daily before breakfast.  12/06/18  Yes Baxley, Cresenciano Lick, MD  pantoprazole (PROTONIX) 40 MG tablet Take 1 tablet (40 mg total) by mouth daily. 10/09/18  Yes Pyrtle, Lajuan Lines, MD  prochlorperazine (COMPAZINE) 10 MG tablet Take 1 tablet (10 mg total) by mouth every 6 (six) hours as needed for nausea or vomiting. 01/04/19  Yes Higgs, Mathis Dad, MD  Propylene Glycol (SYSTANE BALANCE) 0.6 % SOLN Place 1 drop into both eyes daily as needed (for dry eyes).   Yes [provider]  losartan (COZAAR) 25 MG tablet TAKE 1 TABLET BY MOUTH EVERY DAY 01/22/19   Evans Lance, MD  omeprazole (PRILOSEC) 40 MG capsule Take 1 capsule (40 mg total) by mouth daily. Patient not taking: Reported on 01/22/2019 01/15/19   Harle Stanford., PA-C    Family History Family History  Problem Relation Age of Onset  . Heart attack Mother   . Heart disease Mother   . Hypertension Mother   . Lung cancer Father   . Pancreatic cancer Father   . Cancer Father   . Stroke Paternal Grandfather   . Colon cancer Neg Hx   . Diabetes Neg Hx   . Kidney disease Neg Hx   . Liver disease Neg Hx     Social History Social History   Tobacco Use  . Smoking status:  Never Smoker  . Smokeless tobacco: Never Used  Substance Use Topics  . Alcohol use: Yes    Comment: 2-4 beers per week  . Drug use: No     Allergies   Patient has no known allergies.   Review of Systems Review of Systems  Constitutional: Negative for fever.  Respiratory: Positive for shortness of breath. Negative for cough.   Cardiovascular: Positive for leg swelling (ankles). Negative for chest pain.  All other systems reviewed and are negative.    Physical Exam Updated Vital Signs BP (!) 104/50   Pulse 73   Temp 98.6 F (37 C) (Oral)   Resp 18   Ht 4\' 11"  (1.499 m)   Wt 86.6 kg   SpO2 97%   BMI 38.58 kg/m   Physical Exam Vitals signs and nursing note reviewed.  Constitutional:      General: She is not in acute distress.    Appearance: She is well-developed. She is obese. She is not ill-appearing or diaphoretic.  HENT:     Head: Normocephalic and atraumatic.     Right Ear: External ear normal.     Left Ear: External ear normal.     Nose: Nose normal.  Eyes:     General:        Right eye: No discharge.        Left eye: No discharge.  Cardiovascular:     Rate and Rhythm: Normal rate and regular rhythm.     Heart sounds: Normal heart sounds.  Pulmonary:     Effort: Pulmonary effort is  normal. No tachypnea or accessory muscle usage.     Breath sounds: Examination of the left-lower field reveals decreased breath sounds. Decreased breath sounds (mild) and rales (occasional) present.  Abdominal:     Palpations: Abdomen is soft.     Tenderness: There is no abdominal tenderness.  Musculoskeletal:     Right lower leg: Edema present.     Left lower leg: Edema present.     Comments: Trace ankle swelling bilaterally  Skin:    General: Skin is warm and dry.  Neurological:     Mental Status: She is alert.  Psychiatric:        Mood and Affect: Mood is not anxious.      ED Treatments / Results  Labs (all labs ordered are listed, but only abnormal results are displayed) Labs Reviewed  COMPREHENSIVE METABOLIC PANEL - Abnormal; Notable for the following components:      Result Value   Sodium 132 (*)    Chloride 93 (*)    Glucose, Bld 122 (*)    Creatinine, Ser 1.03 (*)    Calcium 8.5 (*)    Total Protein 6.1 (*)    Albumin 3.1 (*)    AST 11 (*)    Total Bilirubin 2.0 (*)    GFR calc non Af Amer 54 (*)    All other components within normal limits  CBC WITH DIFFERENTIAL/PLATELET - Abnormal; Notable for the following components:   WBC 3.8 (*)    RBC 1.78 (*)    Hemoglobin 6.1 (*)    HCT 19.0 (*)    MCV 106.7 (*)    MCH 34.3 (*)    RDW 23.3 (*)    Platelets 65 (*)    nRBC 6.9 (*)    All other components within normal limits  URINALYSIS, ROUTINE W REFLEX MICROSCOPIC - Abnormal; Notable for the following components:   Specific Gravity, Urine 1.003 (*)  All other components within normal limits  BRAIN NATRIURETIC PEPTIDE - Abnormal; Notable for the following components:   B Natriuretic Peptide 929.8 (*)    All other components within normal limits  CULTURE, BLOOD (ROUTINE X 2)  CULTURE, BLOOD (ROUTINE X 2)  SARS CORONAVIRUS 2 (HOSPITAL ORDER, [facility])  LACTIC ACID, PLASMA  TROPONIN I  LACTIC ACID, PLASMA  HEMOGLOBIN AND  HEMATOCRIT, BLOOD  TYPE AND SCREEN  PREPARE RBC (CROSSMATCH)  ABO/RH    EKG None  Radiology Dg Chest 2 View  Result Date: 01/22/2019 CLINICAL DATA:  Shortness of breath and fatigue. History of myelodysplasia EXAM: CHEST - 2 VIEW COMPARISON:  August 07, 2018 FINDINGS: There is patchy airspace consolidation in both upper lobes. There is also more subtle airspace consolidation in the right lower lobe. 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 leads are attached to the right atrium and right ventricle. No adenopathy. No bone lesions. IMPRESSION: Multifocal airspace consolidation. Suspect multifocal pneumonia. Atypical organism pneumonia a differential consideration. There is pulmonary vascular congestion. No appreciable interstitial edema. Pacemaker leads attached to right atrium and right ventricle. These results will be called to the ordering clinician or representative by the Radiologist Assistant, and communication documented in the PACS or zVision Dashboard. Electronically Signed   By: Lowella Grip III M.D.   On: 01/22/2019 11:42    Procedures .Critical Care Performed by: Sherwood Gambler, MD Authorized by: Sherwood Gambler, MD   Critical care provider statement:    Critical care time (minutes):  35   Critical care time was exclusive of:  Separately billable procedures and treating other patients   Critical care was necessary to treat or prevent imminent or life-threatening deterioration of the following conditions:  Respiratory failure and circulatory failure   Critical care was time spent personally by me on the following activities:  Discussions with consultants, evaluation of patient's response to treatment, examination of patient, ordering and performing treatments and interventions, ordering and review of laboratory studies, ordering and review of radiographic studies, pulse oximetry, re-evaluation of patient's condition, obtaining history from patient or  surrogate and review of old charts   (including critical care time)  Medications Ordered in ED Medications  sodium chloride flush (NS) 0.9 % injection 3 mL (3 mLs Intravenous Not Given 01/22/19 1258)  0.9 %  sodium chloride infusion (has no administration in time range)  cefTRIAXone (ROCEPHIN) 1 g in sodium chloride 0.9 % 100 mL IVPB (1 g Intravenous New Bag/Given 01/22/19 1443)  azithromycin (ZITHROMAX) 500 mg in sodium chloride 0.9 % 250 mL IVPB (0 mg Intravenous Stopped 01/22/19 1430)  albuterol (PROVENTIL) (2.5 MG/3ML) 0.083% nebulizer solution 2.5 mg (has no administration in time range)  0.9 %  sodium chloride infusion ( Intravenous New Bag/Given 01/22/19 1440)  azithromycin (ZITHROMAX) 500 mg in sodium chloride 0.9 % 250 mL IVPB (500 mg Intravenous New Bag/Given 01/22/19 1331)     Initial Impression / Assessment and Plan / ED Course  I have reviewed the triage vital signs and the nursing notes.  Pertinent labs & imaging results that were available during my care of the patient were reviewed by me and considered in my medical decision making (see chart for details).        Patient presents with hypoxia and shortness of breath.  She is found to have what is probably pneumonia on her chest x-ray though with atypical symptoms with no cough or fever.  She also has what appears to be  symptomatic anemia with a hemoglobin of 6.  She will be transfused 1 unit.  She was also given IV antibiotics for pneumonia.  She will be admitted to the hospitalist service.  [name] was evaluated in Emergency Department on 01/22/2019 for the symptoms described in the history of present illness. She was evaluated in the context of the global COVID-19 pandemic, which necessitated consideration that the patient might be at risk for infection with the SARS-CoV-2 virus that causes COVID-19. Institutional protocols and algorithms that pertain to the evaluation of patients at risk for COVID-19 are in a state of  rapid change based on information released by regulatory bodies including the CDC and federal and state organizations. These policies and algorithms were followed during the patient's care in the ED.   Final Clinical Impressions(s) / ED Diagnoses   Final diagnoses:  Acute respiratory failure with hypoxia ([place])  Symptomatic anemia    ED Discharge Orders    None       Sherwood Gambler, MD 01/22/19 1659

## 2019-01-22 NOTE — ED Notes (Signed)
Date and time results received: 01/22/19 1:27 PM  (use smartphrase ".now" to insert current time)  Test: hgb  Critical Value:6.1  Name of Provider Notified: Dr Regenia Skeeter  Orders Received? Or Actions Taken?:

## 2019-01-22 NOTE — H&P (Signed)
History and Physical    [name] RCV:[id number] DOB: [birth year] DOA: 01/22/2019  PCP: Elby Showers, MD  Dr Walden Field is her oncologist Patient coming from: home patient lives alone she has no family members.  Chief Complaint:sob and weakness   HPI: [name] is a 73 y.o. female with medical history significant of MDS NICMP EF 25 to  30 % echo November 2019 came in with complaints of shortness of breath and generalized weakness.  Patient denies any fever chills or cough but she does feel short of breath she denies any travel other than going to see the doctors.  She lives alone.  She denies any nausea vomiting diarrhea.  Denies abdominal pain urinary complaints.  Denies chest pain.  She does have a history of nonischemic cardiomyopathy for which she takes diuretics.  She was sent here from her primary care doctor's office with hypoxia 80% on room air was placed on 2 L of oxygen oxygen saturation improved to 96%.  She was also found to have a hemoglobin of 6.1.  She was transfused earlier this month 2 units of blood for a hemoglobin of 5.  Any hematuria hematochezia or hemoptysis.  Patient does not have oxygen at home.    ED Course: COVID test is pending.  Blood transfusion ordered.  Sodium 132 potassium 3.6 BUN 22 creatinine 1.03 albumin 3.1 AST 11 ALT 21 total bilirubin 2.0.  Globin 6.1 platelet count 65 white count 3.8 blood culture done in the ER.  Blood pressure 103 45 pulse is 76 respiration 18 temp temperature 98.4 saturation 97% on 2 L of oxygen chest x-ray shows multifocal pneumonia.  Review of Systems: As per HPI otherwise all other systems reviewed and are negative  Ambulatory Status: She is ambulatory at baseline.  Past Medical History:  Diagnosis Date  . Automatic implantable cardioverter-defibrillator in situ    Dr. Beckie Salts follows-next visit- [phone]  . Cataract   . Diverticulosis   . Dyslipidemia   . Ejection fraction < 50%    EF 25-30%, September, 2010  . GERD  (gastroesophageal reflux disease)   . Hypothyroidism   . ICD (implantable cardiac defibrillator) in place    10/2009; Dr. Lovena Le  . Iron deficiency 09/21/2018  . LBBB (left bundle branch block)    old  . Macrocytic anemia   . MDS (myelodysplastic syndrome) ([place]) 12/27/2018  . Mitral regurgitation    Mild, echo, September, 2010  . Nonischemic cardiomyopathy ([place])    Normal coronary arteries, catheterization, September, 2009  . Overweight(278.02)   . Serrated polyp of colon   . Systolic CHF, chronic (HCC)    no recent problems    Past Surgical History:  Procedure Laterality Date  . BIOPSY  08/07/2018   Procedure: BIOPSY;  Surgeon: Jerene Bears, MD;  Location: [facility];  Service: Gastroenterology;;  . CARDIAC CATHETERIZATION     '09 last  . [place]    . COLONOSCOPY N/A 11/29/2013   Procedure: COLONOSCOPY;  Surgeon: Jerene Bears, MD;  Location: [facility];  Service: Gastroenterology;  Laterality: N/A;  . COLONOSCOPY N/A 02/25/2015   Procedure: COLONOSCOPY;  Surgeon: Jerene Bears, MD;  Location: [facility];  Service: Gastroenterology;  Laterality: N/A;  . ESOPHAGOGASTRODUODENOSCOPY (EGD) WITH PROPOFOL N/A 08/07/2018   Procedure: ESOPHAGOGASTRODUODENOSCOPY (EGD) WITH PROPOFOL;  Surgeon: Jerene Bears, MD;  Location: [facility];  Service: Gastroenterology;  Laterality: N/A;  . EYE SURGERY Left    x 3-"droopy eyelid"  . heart  catherization    . TONSILLECTOMY  1952    Social History   Socioeconomic History  . Marital status: Single    Spouse name: Not on file  . Number of children: Not on file  . Years of education: Not on file  . Highest education level: Not on file  Occupational History  . Not on file  Social Needs  . Financial resource strain: Not on file  . Food insecurity:    Worry: Not on file    Inability: Not on file  . Transportation needs:    Medical: Not on file    Non-medical: Not on file  Tobacco Use  . Smoking status: Never  Smoker  . Smokeless tobacco: Never Used  Substance and Sexual Activity  . Alcohol use: Yes    Comment: 2-4 beers per week  . Drug use: No  . Sexual activity: Never    Birth control/protection: Abstinence  Lifestyle  . Physical activity:    Days per week: Not on file    Minutes per session: Not on file  . Stress: Not on file  Relationships  . Social connections:    Talks on phone: Not on file    Gets together: Not on file    Attends religious service: Not on file    Active member of club or organization: Not on file    Attends meetings of clubs or organizations: Not on file    Relationship status: Not on file  . Intimate partner violence:    Fear of current or ex partner: Not on file    Emotionally abused: Not on file    Physically abused: Not on file    Forced sexual activity: Not on file  Other Topics Concern  . Not on file  Social History Narrative  . Not on file    No Known Allergies  Family History  Problem Relation Age of Onset  . Heart attack Mother   . Heart disease Mother   . Hypertension Mother   . Lung cancer Father   . Pancreatic cancer Father   . Cancer Father   . Stroke Paternal Grandfather   . Colon cancer Neg Hx   . Diabetes Neg Hx   . Kidney disease Neg Hx   . Liver disease Neg Hx       Prior to Admission medications   Medication Sig Start Date End Date Taking? Authorizing Provider  albuterol (PROVENTIL HFA;VENTOLIN HFA) 108 (90 Base) MCG/ACT inhaler Inhale 2 puffs into the lungs every 6 (six) hours as needed for wheezing or shortness of breath. 11/23/16   Elby Showers, MD  carvedilol (COREG) 25 MG tablet TAKE 1 TABLET BY MOUTH TWICE A DAY 04/27/18   Nahser, Wonda Cheng, MD  folic acid (FOLVITE) 1 MG tablet Take 1 tablet (1 mg total) by mouth daily. 11/24/18   Elby Showers, MD  furosemide (LASIX) 40 MG tablet TAKE 1 TABLET BY MOUTH EVERY DAY 04/27/18   Nahser, Wonda Cheng, MD  glucose blood (ACCU-CHEK AVIVA) test strip Check daily.  For Diabetes 250.00  05/29/14   Elby Showers, MD  ipratropium-albuterol (DUONEB) 0.5-2.5 (3) MG/3ML SOLN Take 3 mLs by nebulization every 4 (four) hours as needed (shortness of breath or wheezing).    [provider]  Lancets MISC Check glucose daily.  Diabetes 250.00 05/29/14   Elby Showers, MD  levothyroxine (SYNTHROID, LEVOTHROID) 150 MCG tablet TAKE 1 TABLET BY MOUTH EVERY DAY 12/06/18   Elby Showers,  MD  losartan (COZAAR) 25 MG tablet TAKE 1 TABLET BY MOUTH EVERY DAY 04/28/18   Evans Lance, MD  omeprazole (PRILOSEC) 40 MG capsule Take 1 capsule (40 mg total) by mouth daily. 01/15/19   Tanner, Lyndon Code., PA-C  pantoprazole (PROTONIX) 40 MG tablet Take 1 tablet (40 mg total) by mouth daily. 10/09/18   Pyrtle, Lajuan Lines, MD  prochlorperazine (COMPAZINE) 10 MG tablet Take 1 tablet (10 mg total) by mouth every 6 (six) hours as needed for nausea or vomiting. 01/04/19   Higgs, Mathis Dad, MD  Propylene Glycol (SYSTANE BALANCE) 0.6 % SOLN Place 1 drop into both eyes daily as needed (for dry eyes).    [provider]    Physical Exam: Vitals:   01/22/19 1240 01/22/19 1242 01/22/19 1330  BP: (!) 103/35  (!) 103/45  Pulse: 77  76  Resp: 20    Temp: 98.4 F (36.9 C)    TempSrc: Oral    SpO2: 96%  97%  Weight:  86.6 kg   Height:  4\' 11"  (1.499 m)      . General:  Appears calm and comfortable . Eyes: PERRL, EOMI, normal lids, iris . ENT:  grossly normal hearing, lips & tongue, mmm . Neck:  no LAD, masses or thyromegaly . Cardiovascular:  RRR, no m/r/g. No LE edema.  Marland Kitchen Respiratory: Bilateral rhonchi bilaterally, no w/r/r. Normal respiratory effort. . Abdomen:  soft, ntnd, NABS . Skin:  no rash or induration seen on limited exam . Musculoskeletal: grossly normal tone BUE/BLE, good ROM, no bony abnormality . Psychiatric:  grossly normal mood and affect, speech fluent and appropriate, AOx3 . Neurologic:  CN 2-12 grossly intact, moves all extremities in coordinated fashion, sensation intact  Labs on  Admission: I have personally reviewed following labs and imaging studies  CBC: Recent Labs  Lab 01/16/19 1152 01/22/19 1247  WBC  --  3.8*  NEUTROABS  --  2.3  HGB 6.0* 6.1*  HCT 17.9* 19.0*  MCV  --  106.7*  PLT  --  65*   Basic Metabolic Panel: Recent Labs  Lab 01/22/19 1247  NA 132*  K 3.6  CL 93*  CO2 31  GLUCOSE 122*  BUN 22  CREATININE 1.03*  CALCIUM 8.5*   GFR: Estimated Creatinine Clearance: 47.2 mL/min (A) (by C-G formula based on SCr of 1.03 mg/dL (H)). Liver Function Tests: Recent Labs  Lab 01/22/19 1247  AST 11*  ALT 21  ALKPHOS 55  BILITOT 2.0*  PROT 6.1*  ALBUMIN 3.1*   No results for input(s): LIPASE, AMYLASE in the last 168 hours. No results for input(s): AMMONIA in the last 168 hours. Coagulation Profile: No results for input(s): INR, PROTIME in the last 168 hours. Cardiac Enzymes: No results for input(s): CKTOTAL, CKMB, CKMBINDEX, TROPONINI in the last 168 hours. BNP (last 3 results) No results for input(s): PROBNP in the last 8760 hours. HbA1C: No results for input(s): HGBA1C in the last 72 hours. CBG: No results for input(s): GLUCAP in the last 168 hours. Lipid Profile: No results for input(s): CHOL, HDL, LDLCALC, TRIG, CHOLHDL, LDLDIRECT in the last 72 hours. Thyroid Function Tests: No results for input(s): TSH, T4TOTAL, FREET4, T3FREE, THYROIDAB in the last 72 hours. Anemia Panel: No results for input(s): VITAMINB12, FOLATE, FERRITIN, TIBC, IRON, RETICCTPCT in the last 72 hours. Urine analysis:    Component Value Date/Time   COLORURINE YELLOW 01/22/2019 1247   APPEARANCEUR CLEAR 01/22/2019 1247   LABSPEC 1.003 (L) 01/22/2019 1247  PHURINE 5.0 01/22/2019 1247   GLUCOSEU NEGATIVE 01/22/2019 1247   HGBUR NEGATIVE 01/22/2019 [place] 01/22/2019 1247   BILIRUBINUR NEG 05/18/2018 1420   KETONESUR NEGATIVE 01/22/2019 1247   PROTEINUR NEGATIVE 01/22/2019 1247   UROBILINOGEN 0.2 05/18/2018 1420   UROBILINOGEN 2.0  (H) 08/27/2014 1747   NITRITE NEGATIVE 01/22/2019 1247   LEUKOCYTESUR NEGATIVE 01/22/2019 1247    Creatinine Clearance: Estimated Creatinine Clearance: 47.2 mL/min (A) (by C-G formula based on SCr of 1.03 mg/dL (H)).  Sepsis Labs: @LABRCNTIP (procalcitonin:4,lacticidven:4) )No results found for this or any previous visit (from the past 240 hour(s)).   Radiological Exams on Admission: Dg Chest 2 View  Result Date: 01/22/2019 CLINICAL DATA:  Shortness of breath and fatigue. History of myelodysplasia EXAM: CHEST - 2 VIEW COMPARISON:  August 07, 2018 FINDINGS: There is patchy airspace consolidation in both upper lobes. There is also more subtle airspace consolidation in the right lower lobe. 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 leads are attached to the right atrium and right ventricle. No adenopathy. No bone lesions. IMPRESSION: Multifocal airspace consolidation. Suspect multifocal pneumonia. Atypical organism pneumonia a differential consideration. There is pulmonary vascular congestion. No appreciable interstitial edema. Pacemaker leads attached to right atrium and right ventricle. These results will be called to the ordering clinician or representative by the Radiologist Assistant, and communication documented in the PACS or zVision Dashboard. Electronically Signed   By: Lowella Grip III M.D.   On: 01/22/2019 11:42    Assessment/Plan Active Problems:   * No active hospital problems. *    #1  Acute hypoxic respiratory failure secondary to multifocal pneumonia COVID pending patient has history of MDS certainly immunocompromised.  We will start her on Rocephin and azithromycin.  Albuterol inhaler till COVID ruled out.  #2 acute on chronic anemia secondary to MDS hemoglobin 6.1 transfuse 1 unit of packed RBCs.  Patient has history of multiple blood transfusion.  #3 hypertension her blood pressure is soft hold Coreg and Cozaar she was taking at home.  I will  give her normal saline 75 cc an hour for 1 L.  #4 hypothyroidism continue Synthroid.  #5 systolic heart failure with decreased ejection fraction 25 to 30% echo that was done in November 2019.  Patient appears dry hold Lasix slow hydration for 12 hours restart Lasix as needed.   Estimated body mass index is 38.58 kg/m as calculated from the following:   Height as of this encounter: 4\' 11"  (1.499 m).   Weight as of this encounter: 86.6 kg.   DVT prophylaxis: SCD Code Status: Full code Family Communication: She has no family she lives alone Disposition Plan: Pending clinical improvement Consults called: None Admission status: Inpatient   Georgette Shell MD [facility]  If 7PM-7AM, please contact night-coverage [email or web address] Password [facility]  01/22/2019, 1:43 PM

## 2019-01-22 NOTE — Progress Notes (Signed)
PT has COVID test pending at this time. Albuterol nebulizer not given at this time.

## 2019-01-22 NOTE — Patient Instructions (Addendum)
Go immediately to [facility] emergency department for further evaluation of shortness of breath

## 2019-01-22 NOTE — ED Notes (Signed)
ED TO INPATIENT HANDOFF REPORT  ED Nurse Name and Phone #:   S Name/Age/Gender [name] 73 y.o. female Room/Bed: WA08/WA08  Code Status   Code Status: Full Code  Home/SNF/Other Home Patient oriented to: self, place, time and situation Is this baseline? Yes   Triage Complete: Triage complete  Chief Complaint pneumonia  Triage Note Sent by from MD related to pneumonia from on imaging. Pt receives chemotherapy for diagnosis of MDS. Pt hypoxic on arrival; RA oxygen noted 80%. Placed on 2 lpm [place] and now saturation of 96%. SOB with exertion noted.   Allergies No Known Allergies  Level of Care/Admitting Diagnosis ED Disposition    ED Disposition Condition Comment   Admit  Hospital Area: [facility] [[id number]]  Level of Care: Med-Surg [16]  Covid Evaluation: Person Under Investigation (PUI)  Isolation Risk Level: Low Risk/Droplet (Less than 4L [place] supplementation)  Diagnosis: Multifocal pneumonia [[id number]]  Admitting Physician: Georgette Shell [[id number]]  Attending Physician: Georgette Shell [[id number]]  Estimated length of stay: 3 - 4 days  Certification:: I certify this patient will need inpatient services for at least 2 midnights  PT Class (Do Not Modify): Inpatient [101]  PT Acc Code (Do Not Modify): Private [1]       B Medical/Surgery History Past Medical History:  Diagnosis Date  . Automatic implantable cardioverter-defibrillator in situ    Dr. Beckie Salts follows-next visit- [phone]  . Cataract   . Diverticulosis   . Dyslipidemia   . Ejection fraction < 50%    EF 25-30%, September, 2010  . GERD (gastroesophageal reflux disease)   . Hypothyroidism   . ICD (implantable cardiac defibrillator) in place    10/2009; Dr. Lovena Le  . Iron deficiency 09/21/2018  . LBBB (left bundle branch block)    old  . Macrocytic anemia   . MDS (myelodysplastic syndrome) ([place]) 12/27/2018  . Mitral regurgitation    Mild, echo, September, 2010  .  Nonischemic cardiomyopathy ([place])    Normal coronary arteries, catheterization, September, 2009  . Overweight(278.02)   . Serrated polyp of colon   . Systolic CHF, chronic (HCC)    no recent problems   Past Surgical History:  Procedure Laterality Date  . BIOPSY  08/07/2018   Procedure: BIOPSY;  Surgeon: Jerene Bears, MD;  Location: [facility];  Service: Gastroenterology;;  . CARDIAC CATHETERIZATION     '09 last  . [place]    . COLONOSCOPY N/A 11/29/2013   Procedure: COLONOSCOPY;  Surgeon: Jerene Bears, MD;  Location: [facility];  Service: Gastroenterology;  Laterality: N/A;  . COLONOSCOPY N/A 02/25/2015   Procedure: COLONOSCOPY;  Surgeon: Jerene Bears, MD;  Location: [facility];  Service: Gastroenterology;  Laterality: N/A;  . ESOPHAGOGASTRODUODENOSCOPY (EGD) WITH PROPOFOL N/A 08/07/2018   Procedure: ESOPHAGOGASTRODUODENOSCOPY (EGD) WITH PROPOFOL;  Surgeon: Jerene Bears, MD;  Location: [facility];  Service: Gastroenterology;  Laterality: N/A;  . EYE SURGERY Left    x 3-"droopy eyelid"  . heart catherization    . TONSILLECTOMY  1952     A IV Location/Drains/Wounds Patient Lines/Drains/Airways Status   Active Line/Drains/Airways    Name:   Placement date:   Placement time:   Site:   Days:   Peripheral IV 01/22/19 Right Antecubital   01/22/19    1245    Antecubital   less than 1   Incision (Closed) 12/15/18 Buttocks Right;Upper   12/15/18    1140     38  Intake/Output Last 24 hours  Intake/Output Summary (Last 24 hours) at 01/22/2019 1552 Last data filed at 01/22/2019 1454 Gross per 24 hour  Intake -  Output 500 ml  Net -500 ml    Labs/Imaging Results for orders placed or performed during the hospital encounter of 01/22/19 (from the past 48 hour(s))  Lactic acid, plasma     Status: None   Collection Time: 01/22/19 12:46 PM  Result Value Ref Range   Lactic Acid, Venous 1.4 0.5 - 1.9 mmol/L    Comment: Performed at [facility], [place] [address] [place], [place] [address]  Comprehensive metabolic panel     Status: Abnormal   Collection Time: 01/22/19 12:47 PM  Result Value Ref Range   Sodium 132 (L) 135 - 145 mmol/L   Potassium 3.6 3.5 - 5.1 mmol/L   Chloride 93 (L) 98 - 111 mmol/L   CO2 31 22 - 32 mmol/L   Glucose, Bld 122 (H) 70 - 99 mg/dL   BUN 22 8 - 23 mg/dL   Creatinine, Ser 1.03 (H) 0.44 - 1.00 mg/dL   Calcium 8.5 (L) 8.9 - 10.3 mg/dL   Total Protein 6.1 (L) 6.5 - 8.1 g/dL   Albumin 3.1 (L) 3.5 - 5.0 g/dL   AST 11 (L) 15 - 41 U/L   ALT 21 0 - 44 U/L   Alkaline Phosphatase 55 38 - 126 U/L   Total Bilirubin 2.0 (H) 0.3 - 1.2 mg/dL   GFR calc non Af Amer 54 (L) >60 mL/min   GFR calc Af Amer >60 >60 mL/min   Anion gap 8 5 - 15    Comment: Performed at [facility], [place] [address] [place], [place] [address]  CBC with Differential     Status: Abnormal   Collection Time: 01/22/19 12:47 PM  Result Value Ref Range   WBC 3.8 (L) 4.0 - 10.5 K/uL   RBC 1.78 (L) 3.87 - 5.11 MIL/uL   Hemoglobin 6.1 (LL) 12.0 - 15.0 g/dL    Comment: This critical result has verified and been called to HALL, C. by Raelyn Ensign on 05 11 2020 at 1326, and has been read back. CRITICAL RESULT VERIFIED   HCT 19.0 (L) 36.0 - 46.0 %   MCV 106.7 (H) 80.0 - 100.0 fL   MCH 34.3 (H) 26.0 - 34.0 pg   MCHC 32.1 30.0 - 36.0 g/dL   RDW 23.3 (H) 11.5 - 15.5 %   Platelets 65 (L) 150 - 400 K/uL    Comment: REPEATED TO VERIFY PLATELET COUNT CONFIRMED BY SMEAR SPECIMEN CHECKED FOR CLOTS Immature Platelet Fraction may be clinically indicated, consider ordering this additional test [id number]    nRBC 6.9 (H) 0.0 - 0.2 %   Neutrophils Relative % 61 %   Neutro Abs 2.3 1.7 - 7.7 K/uL   Lymphocytes Relative 27 %   Lymphs Abs 1.0 0.7 - 4.0 K/uL   Monocytes Relative 11 %   Monocytes Absolute 0.4 0.1 - 1.0 K/uL   Eosinophils Relative 0 %   Eosinophils Absolute 0.0 0.0 - 0.5 K/uL   Basophils Relative 0 %    Basophils Absolute 0.0 0.0 - 0.1 K/uL   Immature Granulocytes 1 %   Abs Immature Granulocytes 0.03 0.00 - 0.07 K/uL   Polychromasia PRESENT    Target Cells PRESENT     Comment: Performed at [facility], [place] [address] [place], [place] [address]  Urinalysis, Routine w reflex microscopic  Status: Abnormal   Collection Time: 01/22/19 12:47 PM  Result Value Ref Range   Color, Urine YELLOW YELLOW   APPearance CLEAR CLEAR   Specific Gravity, Urine 1.003 (L) 1.005 - 1.030   pH 5.0 5.0 - 8.0   Glucose, UA NEGATIVE NEGATIVE mg/dL   Hgb urine dipstick NEGATIVE NEGATIVE   Bilirubin Urine NEGATIVE NEGATIVE   Ketones, ur NEGATIVE NEGATIVE mg/dL   Protein, ur NEGATIVE NEGATIVE mg/dL   Nitrite NEGATIVE NEGATIVE   Leukocytes,Ua NEGATIVE NEGATIVE    Comment: Performed at [facility], [place] [address] [place], [place] [address]  Culture, blood (routine x 2)     Status: None (Preliminary result)   Collection Time: 01/22/19 12:51 PM  Result Value Ref Range   Specimen Description      BLOOD RIGHT ANTECUBITAL Performed at [facility], [place] [address] [place], [place] [address]    Special Requests      BOTTLES DRAWN AEROBIC AND ANAEROBIC Blood Culture results may not be optimal due to an excessive volume of blood received in culture bottles Performed at [place] [address], [place], [place] [address]    Culture PENDING    Report Status PENDING   Culture, blood (routine x 2)     Status: None (Preliminary result)   Collection Time: 01/22/19 12:56 PM  Result Value Ref Range   Specimen Description      BLOOD RIGHT ANTECUBITAL Performed at [facility], [place] [address] [place], [place] [address]    Special Requests      BOTTLES DRAWN AEROBIC AND ANAEROBIC Blood Culture adequate volume Performed at [place] [address], [place], [place] [address]    Culture PENDING    Report Status PENDING    Brain natriuretic peptide     Status: Abnormal   Collection Time: 01/22/19 12:58 PM  Result Value Ref Range   B Natriuretic Peptide 929.8 (H) 0.0 - 100.0 pg/mL    Comment: Performed at [facility], [place] [address], [address] [place], [place] [address]  Troponin I - Once     Status: None   Collection Time: 01/22/19 12:58 PM  Result Value Ref Range   Troponin I <0.03 <0.03 ng/mL    Comment: Performed at [facility], [place] [address], [place], [place] [address]  SARS Coronavirus 2 (CEPHEID- Performed in [place] hospital lab), Hosp Order     Status: None   Collection Time: 01/22/19 12:59 PM  Result Value Ref Range   SARS Coronavirus 2 NEGATIVE NEGATIVE    Comment: (NOTE) If result is NEGATIVE SARS-CoV-2 target nucleic acids are NOT DETECTED. The SARS-CoV-2 RNA is generally detectable in upper and lower  respiratory specimens during the acute phase of infection. The lowest  concentration of SARS-CoV-2 viral copies this assay can detect is 250  copies / mL. A negative result does not preclude SARS-CoV-2 infection  and should not be used as the sole basis for treatment or other  patient management decisions.  A negative result may occur with  improper specimen collection / handling, submission of specimen other  than nasopharyngeal swab, presence of viral mutation(s) within the  areas targeted by this assay, and inadequate number of viral copies  (<250 copies / mL). A negative result must be combined with clinical  observations, patient history, and epidemiological information. If result is POSITIVE SARS-CoV-2 target nucleic acids are DETECTED. The SARS-CoV-2 RNA is generally detectable in upper and  lower  respiratory specimens dur ing the acute phase of infection.  Positive  results are indicative of active infection with SARS-CoV-2.  Clinical  correlation with patient history and other diagnostic information is  necessary to determine patient infection  status.  Positive results do  not rule out bacterial infection or co-infection with other viruses. If result is PRESUMPTIVE POSTIVE SARS-CoV-2 nucleic acids MAY BE PRESENT.   A presumptive positive result was obtained on the submitted specimen  and confirmed on repeat testing.  While 2019 novel coronavirus  (SARS-CoV-2) nucleic acids may be present in the submitted sample  additional confirmatory testing may be necessary for epidemiological  and / or clinical management purposes  to differentiate between  SARS-CoV-2 and other Sarbecovirus currently known to infect humans.  If clinically indicated additional testing with an alternate test  methodology [phone]) is advised. The SARS-CoV-2 RNA is generally  detectable in upper and lower respiratory sp ecimens during the acute  phase of infection. The expected result is Negative. Fact Sheet for Patients:  StrictlyIdeas.no Fact Sheet for Healthcare Providers: BankingDealers.co.za This test is not yet approved or cleared by the [place] FDA and has been authorized for detection and/or diagnosis of SARS-CoV-2 by FDA under an Emergency Use Authorization (EUA).  This EUA will remain in effect (meaning this test can be used) for the duration of the COVID-19 declaration under Section 564(b)(1) of the Act, 21 U.S.C. section 360bbb-3(b)(1), unless the authorization is terminated or revoked sooner. Performed at [facility], [place] [address] [place], [place] [address]   Type and screen [place]     Status: None (Preliminary result)   Collection Time: 01/22/19  1:29 PM  Result Value Ref Range   ABO/RH(D) O POS    Antibody Screen NEG    Sample Expiration 01/25/2019,2359    Unit Number [id number]    Blood Component Type RED CELLS,LR    Unit division 00    Status of Unit ISSUED    Transfusion Status OK TO TRANSFUSE    Crossmatch Result       Compatible Performed at [facility], [place] [address], [place], [place] [address]   Prepare RBC     Status: None   Collection Time: 01/22/19  1:31 PM  Result Value Ref Range   Order Confirmation      ORDER PROCESSED BY BLOOD BANK Performed at [place] [address], [place], [place] [address]   ABO/Rh     Status: None   Collection Time: 01/22/19  1:47 PM  Result Value Ref Range   ABO/RH(D)      O POS Performed at [facility], [place] [address], [place],  [address]    Dg Chest 2 View  Result Date: 01/22/2019 CLINICAL DATA:  Shortness of breath and fatigue. History of myelodysplasia EXAM: CHEST - 2 VIEW COMPARISON:  August 07, 2018 FINDINGS: There is patchy airspace consolidation in both upper lobes. There is also more subtle airspace consolidation in the right lower lobe. 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 leads are attached to the right atrium and right ventricle. No adenopathy. No bone lesions. IMPRESSION: Multifocal airspace consolidation. Suspect multifocal pneumonia. Atypical organism pneumonia a differential consideration. There is pulmonary vascular congestion. No appreciable interstitial edema. Pacemaker leads attached to right atrium and right ventricle. These results will be called to the ordering clinician or representative by the Radiologist Assistant, and communication documented in the PACS or zVision  Dashboard. Electronically Signed   By: Lowella Grip III M.D.   On: 01/22/2019 11:42    Pending Labs Unresulted Labs (From admission, onward)    Start     Ordered   01/23/19 0500  CBC  Tomorrow morning,   R     01/22/19 1423   01/23/19 7741  Basic metabolic panel  Tomorrow morning,   R     01/22/19 1423   01/22/19 1424  Hemoglobin and hematocrit, blood  Once,   R    Comments:  Please draw 3 hours after blood transfusion    01/22/19 1423   01/22/19 1243  Lactic acid, plasma   Now then every 2 hours,   STAT     01/22/19 1242          Vitals/Pain Today's Vitals   01/22/19 1515 01/22/19 1530 01/22/19 1541 01/22/19 1545  BP: (!) 108/45 (!) 109/42 (!) 109/42 (!) 108/47  Pulse: 72 76 70 80  Resp: 18  18 18   Temp:   98.5 F (36.9 C)   TempSrc:   Oral   SpO2: 98% 98%  98%  Weight:      Height:      PainSc:        Isolation Precautions Droplet and Contact precautions  Medications Medications  sodium chloride flush (NS) 0.9 % injection 3 mL (3 mLs Intravenous Not Given 01/22/19 1258)  0.9 %  sodium chloride infusion (has no administration in time range)  cefTRIAXone (ROCEPHIN) 1 g in sodium chloride 0.9 % 100 mL IVPB (1 g Intravenous New Bag/Given 01/22/19 1443)  azithromycin (ZITHROMAX) 500 mg in sodium chloride 0.9 % 250 mL IVPB (0 mg Intravenous Stopped 01/22/19 1430)  albuterol (PROVENTIL) (2.5 MG/3ML) 0.083% nebulizer solution 2.5 mg (has no administration in time range)  0.9 %  sodium chloride infusion ( Intravenous New Bag/Given 01/22/19 1440)  azithromycin (ZITHROMAX) 500 mg in sodium chloride 0.9 % 250 mL IVPB (500 mg Intravenous New Bag/Given 01/22/19 1331)    Mobility walks with device Moderate fall risk   Focused Assessments Pulmonary Assessment Handoff:  Lung sounds:   O2 Device: Nasal Cannula O2 Flow Rate (L/min): 2 L/min      R Recommendations: See Admitting Provider Note  Report given to:   Additional Notes:

## 2019-01-23 ENCOUNTER — Inpatient Hospital Stay (HOSPITAL_COMMUNITY): Payer: Medicare Other

## 2019-01-23 DIAGNOSIS — J9690 Respiratory failure, unspecified, unspecified whether with hypoxia or hypercapnia: Secondary | ICD-10-CM

## 2019-01-23 DIAGNOSIS — J189 Pneumonia, unspecified organism: Secondary | ICD-10-CM

## 2019-01-23 LAB — BASIC METABOLIC PANEL
Anion gap: 9 (ref 5–15)
BUN: 19 mg/dL (ref 8–23)
CO2: 31 mmol/L (ref 22–32)
Calcium: 8.3 mg/dL — ABNORMAL LOW (ref 8.9–10.3)
Chloride: 97 mmol/L — ABNORMAL LOW (ref 98–111)
Creatinine, Ser: 0.93 mg/dL (ref 0.44–1.00)
GFR calc Af Amer: >60 mL/min (ref >60)
GFR calc non Af Amer: >60 mL/min (ref >60)
Glucose, Bld: 125 mg/dL — ABNORMAL HIGH (ref 70–99)
Potassium: 4.1 mmol/L (ref 3.5–5.1)
Sodium: 137 mmol/L (ref 135–145)

## 2019-01-23 LAB — CBC
HCT: 21.9 % — ABNORMAL LOW (ref 36.0–46.0)
Hemoglobin: 6.8 g/dL — CL (ref 12.0–15.0)
MCH: 32.9 pg (ref 26.0–34.0)
MCHC: 31.1 g/dL (ref 30.0–36.0)
MCV: 105.8 fL — ABNORMAL HIGH (ref 80.0–100.0)
Platelets: 65 10*3/uL — ABNORMAL LOW (ref 150–400)
RBC: 2.07 MIL/uL — ABNORMAL LOW (ref 3.87–5.11)
RDW: 23.5 % — ABNORMAL HIGH (ref 11.5–15.5)
WBC: 3.7 10*3/uL — ABNORMAL LOW (ref 4.0–10.5)
nRBC: 9.8 % — ABNORMAL HIGH (ref 0.0–0.2)

## 2019-01-23 LAB — PREPARE RBC (CROSSMATCH)

## 2019-01-23 LAB — HEMOGLOBIN AND HEMATOCRIT, BLOOD
HCT: 25.1 % — ABNORMAL LOW (ref 36.0–46.0)
Hemoglobin: 8 g/dL — ABNORMAL LOW (ref 12.0–15.0)

## 2019-01-23 MED ORDER — PANTOPRAZOLE SODIUM 40 MG PO TBEC
40.0000 mg | DELAYED_RELEASE_TABLET | Freq: Every day | ORAL | Status: DC
  Administered 2019-01-23 – 2019-02-08 (×16): 40 mg via ORAL
  Filled 2019-01-23 (×17): qty 1

## 2019-01-23 MED ORDER — SODIUM CHLORIDE 0.9% IV SOLUTION
Freq: Once | INTRAVENOUS | Status: AC
  Administered 2019-01-23: 05:00:00 via INTRAVENOUS

## 2019-01-23 MED ORDER — FOLIC ACID 1 MG PO TABS
1.0000 mg | ORAL_TABLET | Freq: Every day | ORAL | Status: DC
  Administered 2019-01-24 – 2019-02-08 (×15): 1 mg via ORAL
  Filled 2019-01-23 (×15): qty 1

## 2019-01-23 MED ORDER — IOHEXOL 350 MG/ML SOLN
100.0000 mL | Freq: Once | INTRAVENOUS | Status: AC | PRN
  Administered 2019-01-23: 15:00:00 100 mL via INTRAVENOUS

## 2019-01-23 MED ORDER — CARVEDILOL 25 MG PO TABS
25.0000 mg | ORAL_TABLET | Freq: Two times a day (BID) | ORAL | Status: DC
  Administered 2019-01-23 – 2019-02-08 (×26): 25 mg via ORAL
  Filled 2019-01-23 (×3): qty 1
  Filled 2019-01-23: qty 2
  Filled 2019-01-23: qty 1
  Filled 2019-01-23: qty 2
  Filled 2019-01-23 (×17): qty 1
  Filled 2019-01-23 (×2): qty 2
  Filled 2019-01-23: qty 1
  Filled 2019-01-23: qty 2
  Filled 2019-01-23: qty 1

## 2019-01-23 MED ORDER — PROCHLORPERAZINE MALEATE 10 MG PO TABS: 10.0000 mg | ORAL_TABLET | Freq: Four times a day (QID) | ORAL | Status: DC | PRN

## 2019-01-23 MED ORDER — VITAMIN B-12 100 MCG PO TABS
50.0000 ug | ORAL_TABLET | Freq: Every day | ORAL | Status: DC
  Administered 2019-01-24 – 2019-02-08 (×15): 50 ug via ORAL
  Filled 2019-01-23 (×16): qty 1

## 2019-01-23 MED ORDER — LEVOTHYROXINE SODIUM 50 MCG PO TABS
150.0000 ug | ORAL_TABLET | Freq: Every day | ORAL | Status: DC
  Administered 2019-01-24 – 2019-02-08 (×15): 150 ug via ORAL
  Filled 2019-01-23: qty 1
  Filled 2019-01-23: qty 2
  Filled 2019-01-23 (×5): qty 1
  Filled 2019-01-23 (×2): qty 2
  Filled 2019-01-23 (×4): qty 1
  Filled 2019-01-23: qty 2
  Filled 2019-01-23: qty 1

## 2019-01-23 MED ORDER — ENOXAPARIN SODIUM 40 MG/0.4ML ~~LOC~~ SOLN
40.0000 mg | SUBCUTANEOUS | Status: DC
  Administered 2019-01-23 – 2019-02-07 (×16): 40 mg via SUBCUTANEOUS
  Filled 2019-01-23 (×16): qty 0.4

## 2019-01-23 MED ORDER — SODIUM CHLORIDE (PF) 0.9 % IJ SOLN
INTRAMUSCULAR | Status: AC
  Filled 2019-01-23: qty 50

## 2019-01-23 NOTE — Progress Notes (Signed)
CRITICAL VALUE ALERT  Critical Value:  hgb 6.8  Date & Time Notied:  01/23/19  Provider Notified: Baltazar Najjar, NP  Orders Received/Actions taken: 1 unit PRBC ordered and administered.

## 2019-01-23 NOTE — Evaluation (Signed)
Physical Therapy Evaluation Patient Details Name: [name] MRN: [id number] DOB: 06-16-1946 Today's Date: 01/23/2019   History of Present Illness  73 yo female admitted to ED on 5/11 with ShOB, hypoxia, with PNA sent from MD. 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 PMH includes pacemaker, GERD, EF 25-30% 2010, MVR, cardiomyopathy, depression, CHF.   Clinical Impression  Pt presents with generalized weakness, dyspnea on exertion with accompanying O2 desaturation, difficulty performing bed mobility ,and decreased activity tolerance. Pt to benefit from acute PT to address deficits. Pt ambulated short room distance, pt being limited by DOE and fatigue. Pt placed on 2LO2 during ambulation due to sats being 86% on 1L. Pt with increased time to recover dyspnea post-ambulation. PT recommending HHPT to address mobility deficits, pt with great friend support at home as needed.  PT to progress mobility as tolerated, and will continue to follow acutely.      Follow Up Recommendations Home health PT;Supervision for mobility/OOB    Equipment Recommendations  None recommended by PT    Recommendations for Other Services       Precautions / Restrictions Precautions Precautions: Fall(moderate risk) Precaution Comments: watch sats  Restrictions Weight Bearing Restrictions: No      Mobility  Bed Mobility Overal bed mobility: Needs Assistance Bed Mobility: Supine to Sit;Sit to Supine     Supine to sit: Min guard;HOB elevated Sit to supine: Min assist   General bed mobility comments: Min guard for supine to sit for safety, use of bedrail to come to sitting with breath-holding. Min assist for sit to supine for LE lifting, scooting up in bed with lowering HOB and use of bed pad.   Transfers Overall transfer level: Needs assistance Equipment used: Rolling walker (2 wheeled) Transfers: Sit to/from Stand Sit to Stand: From elevated surface;Min assist         General transfer comment: Min assist for initial  power up, pt with self-steadying upon standing. Verbal cuing for hand placement. Pt with SpO2 of 86% on 1LO2 on standing with DOE 2/4, PT increased O2 to 2LO2 with return to high 80s-90%.   Ambulation/Gait Ambulation/Gait assistance: Min guard Gait Distance (Feet): 12 Feet Assistive device: Rolling walker (2 wheeled) Gait Pattern/deviations: Step-through pattern;Decreased stride length;Trunk flexed Gait velocity: decr    General Gait Details: Min guard for safety, pt with good placement in RW during ambulation. Pt with dyspnea 2/4, limited ambulation distance due to ShOB. Sats 91% on 2LO2 during ambulation  Stairs            Wheelchair Mobility    Modified Rankin (Stroke Patients Only)       Balance Overall balance assessment: Needs assistance Sitting-balance support: No upper extremity supported;Feet supported Sitting balance-Leahy Scale: Good     Standing balance support: Bilateral upper extremity supported Standing balance-Leahy Scale: Fair Standing balance comment: able to stand without UE support, uses UE support during dynamic activity                             Pertinent Vitals/Pain Pain Assessment: No/denies pain    Home Living Family/patient expects to be discharged to:: Private residence Living Arrangements: Alone Available Help at Discharge: Friend(s);Available PRN/intermittently Type of Home: House Home Access: Level entry(pt enters home from back of house with level entry)     Home Layout: One level Home Equipment: Tub bench;Walker - 2 wheels;Bedside commode      Prior Function Level of Independence: Needs assistance  Gait / Transfers Assistance Needed: Pt using RW PTA for ambulation, ever since starting treatment for MDS. Pt reports using BSC over toilet as a toilet riser.   ADL's / Homemaking Assistance Needed: Pt reports her friends have been cooking and cleaning home for her since starting chemo for MDS, reports she is able to  dress and bathe self.         Hand Dominance   Dominant Hand: Right    Extremity/Trunk Assessment   Upper Extremity Assessment Upper Extremity Assessment: Overall WFL for tasks assessed    Lower Extremity Assessment Lower Extremity Assessment: Generalized weakness(MMT screen supine reveals at least 3/5 for hip flexion, hip abd/add, knee flexion/extension, PF, DF )    Cervical / Trunk Assessment Cervical / Trunk Assessment: Normal  Communication   Communication: No difficulties  Cognition Arousal/Alertness: Awake/alert Behavior During Therapy: WFL for tasks assessed/performed Overall Cognitive Status: Within Functional Limits for tasks assessed                                        General Comments      Exercises General Exercises - Lower Extremity Ankle Circles/Pumps: Both;AROM;10 reps;Supine Long Arc Quad: AROM;Both;10 reps;Seated   Assessment/Plan    PT Assessment Patient needs continued PT services  PT Problem List Decreased strength;Cardiopulmonary status limiting activity;Decreased balance;Decreased activity tolerance;Decreased mobility;Obesity       PT Treatment Interventions DME instruction;Functional mobility training;Balance training;Patient/family education;Neuromuscular re-education;Therapeutic activities;Gait training;Therapeutic exercise    PT Goals (Current goals can be found in the Care Plan section)  Acute Rehab PT Goals Patient Stated Goal: go home, breathe better PT Goal Formulation: With patient Time For Goal Achievement: 02/06/19 Potential to Achieve Goals: Good    Frequency Min 3X/week   Barriers to discharge        Co-evaluation               AM-PAC PT "6 Clicks" Mobility  Outcome Measure Help needed turning from your back to your side while in a flat bed without using bedrails?: A Little Help needed moving from lying on your back to sitting on the side of a flat bed without using bedrails?: A Little Help  needed moving to and from a bed to a chair (including a wheelchair)?: A Little Help needed standing up from a chair using your arms (e.g., wheelchair or bedside chair)?: A Little Help needed to walk in hospital room?: A Little Help needed climbing 3-5 steps with a railing? : A Little 6 Click Score: 18    End of Session Equipment Utilized During Treatment: Gait belt;Oxygen Activity Tolerance: Other (comment);Patient limited by fatigue(dyspnea) Patient left: in bed;with call bell/phone within reach;with bed alarm set Nurse Communication: Mobility status PT Visit Diagnosis: Other abnormalities of gait and mobility (R26.89);Muscle weakness (generalized) (M62.81);Difficulty in walking, not elsewhere classified (R26.2)    Time: 0092-3300 PT Time Calculation (min) (ACUTE ONLY): 24 min   Charges:   PT Evaluation $PT Eval Low Complexity: 1 Low PT Treatments $Gait Training: 8-22 mins      Julien Girt, PT Acute Rehabilitation Services Pager [phone]  Office [place] 01/23/2019, 2:25 PM

## 2019-01-23 NOTE — Plan of Care (Signed)

## 2019-01-23 NOTE — Progress Notes (Addendum)
PROGRESS NOTE    [name]  ZOX:[id number] DOB: December 15, 1945 DOA: 01/22/2019 PCP: Elby Showers, MD    Brief Narrative:  73 year old female who presented with dyspnea and weakness.  She does have significant past medical history for myelodysplastic syndrome and nonischemic cardiomyopathy systolic dysfunction ejection fraction 25 to 30%.  She was seen by her primary care provider on the day of admission, she was found hypoxic down to 80% on room air and her hemoglobin was down to 6.1, she was placed on supplemental oxygen and transferred to the hospital for further evaluation.  She physical examination blood pressure was 103/35, heart rate 77, respiratory rate 20, temperature 98.4, oxygen saturation 96%, her lungs were showing rhonchi bilaterally, heart S1-S2 present and rhythmic, abdomen was soft, no lower extremity edema.  Sodium 132, potassium 3.6, chloride 83, bicarb 31, glucose 122, BUN 22, creatinine 1.0, white count 3.8, hemoglobin 6.1, hematocrit 19.0, platelets 65.  Urinalysis negative for infection.  Her chest x-ray had bilateral nodular infiltrates.  SARS COVID-19 negative.   Patient was admitted to hospital working diagnosis of acute hypoxic respiratory failure due to multifocal pneumonia.  Assessment & Plan:   Principal Problem:   Acute respiratory failure with hypoxia (HCC) Active Problems:   Hypothyroidism   Essential hypertension   Symptomatic anemia   MDS (myelodysplastic syndrome) (HCC)   Multifocal pneumonia   Respiratory failure ([place])   1. Acute hypoxic respiratory failure, due multilobar pneumonia and severe anemia. Hgb this am is 6.8 after two units PRBC will recheck this pm Hgb and Hct, will target Hgb above 7. No signs of active bleeding. Will continue oxymetry monitoring and supplemental 02 per [place], current 02 saturation is 94% on 1 LPM per [place]. Will continue antibiotic therapy with ceftriaxone and azithromycin. Possible chronic infiltrates, will proceed with  further work up with CT chest with contrast, to rule out PE and evaluate pulmonary parenchyma.   2. MDS with pancytopenia. Continue cell count monitoring, wbc down to 3,7 and Plt at 65.   3. HTN. Will continue to hold on antihypertensive agents, patient at home on losartan and furosemide. Will resume carvedilol 25 mg bid.   4. COPD. Stable with no signs of acute exacerbation, will continue oxymetry monitoring and as needed duoneb.   5. Hypothyroid. Continue levothyroxine.   6. T2DM. Continue glucose monitoring, at home diet control. Her fasting glucose today is 125 mg/dl.    DVT prophylaxis: enoxaparin   Code Status:  full Family Communication: no family at the bedside  Disposition Plan/ discharge barriers: pending clinical improvement.   Body mass index is 38.58 kg/m. Malnutrition Type:      Malnutrition Characteristics:      Nutrition Interventions:     RN Pressure Injury Documentation:     Consultants:     Procedures:     Antimicrobials:   Ceftriaxone  Azithromycin     Subjective: Patient feeling better, dyspnea has improved but continue to have dyspnea, not back to baseline, no nausea or vomiting, no chest pain, no cough or sputum production, Positive lower extremity edema.   Objective: Vitals:   01/23/19 0529 01/23/19 0553 01/23/19 0816 01/23/19 0920  BP: (!) 103/52 (!) 109/56  (!) 112/59  Pulse: 73 76  79  Resp: 16 20  18   Temp: 98 F (36.7 C) 98.3 F (36.8 C)  98.3 F (36.8 C)  TempSrc: Oral Oral  Oral  SpO2: 100% 100% 96% 94%  Weight:      Height:  Intake/Output Summary (Last 24 hours) at 01/23/2019 1229 Last data filed at 01/23/2019 0920 Gross per 24 hour  Intake 1456.74 ml  Output 500 ml  Net 956.74 ml   Filed Weights   01/22/19 1242  Weight: 86.6 kg    Examination:   General: deconditioned and ill looking appearing  Neurology: Awake and alert, non focal  E ENT: mild pallor, no icterus, oral mucosa moist  Cardiovascular: No JVD. S1-S2 present, rhythmic, no gallops, rubs, or murmurs. Trace non pitting lower extremity edema. Pulmonary: positive breath sounds bilaterally, adequate air movement, no wheezing, or rhonchi, scattered bilateral rales. Gastrointestinal. Abdomen with no organomegaly, non tender, no rebound or guarding Skin. No rashes Musculoskeletal: no joint deformities     Data Reviewed: I have personally reviewed following labs and imaging studies  CBC: Recent Labs  Lab 01/22/19 1247 01/22/19 2236 01/23/19 0131  WBC 3.8*  --  3.7*  NEUTROABS 2.3  --   --   HGB 6.1* 7.0* 6.8*  HCT 19.0* 22.4* 21.9*  MCV 106.7*  --  105.8*  PLT 65*  --  65*   Basic Metabolic Panel: Recent Labs  Lab 01/22/19 1247 01/23/19 0131  NA 132* 137  K 3.6 4.1  CL 93* 97*  CO2 31 31  GLUCOSE 122* 125*  BUN 22 19  CREATININE 1.03* 0.93  CALCIUM 8.5* 8.3*   GFR: Estimated Creatinine Clearance: 52.3 mL/min (by C-G formula based on SCr of 0.93 mg/dL). Liver Function Tests: Recent Labs  Lab 01/22/19 1247  AST 11*  ALT 21  ALKPHOS 55  BILITOT 2.0*  PROT 6.1*  ALBUMIN 3.1*   No results for input(s): LIPASE, AMYLASE in the last 168 hours. No results for input(s): AMMONIA in the last 168 hours. Coagulation Profile: No results for input(s): INR, PROTIME in the last 168 hours. Cardiac Enzymes: Recent Labs  Lab 01/22/19 1258  TROPONINI <0.03   BNP (last 3 results) No results for input(s): PROBNP in the last 8760 hours. HbA1C: No results for input(s): HGBA1C in the last 72 hours. CBG: No results for input(s): GLUCAP in the last 168 hours. Lipid Profile: No results for input(s): CHOL, HDL, LDLCALC, TRIG, CHOLHDL, LDLDIRECT in the last 72 hours. Thyroid Function Tests: No results for input(s): TSH, T4TOTAL, FREET4, T3FREE, THYROIDAB in the last 72 hours. Anemia Panel: No results for input(s): VITAMINB12, FOLATE, FERRITIN, TIBC, IRON, RETICCTPCT in the last 72 hours.     Radiology Studies: I have reviewed all of the imaging during this hospital visit personally     Scheduled Meds: . ipratropium-albuterol  3 mL Nebulization TID  . mouth rinse  15 mL Mouth Rinse BID  . sodium chloride flush  3 mL Intravenous Once   Continuous Infusions: . sodium chloride Stopped (01/22/19 1935)  . azithromycin Stopped (01/22/19 1430)  . cefTRIAXone (ROCEPHIN)  IV 1 g (01/22/19 1443)     LOS: 1 day        Juelle Dickmann Gerome Apley, MD

## 2019-01-24 ENCOUNTER — Ambulatory Visit: Payer: Medicare Other | Admitting: Internal Medicine

## 2019-01-24 ENCOUNTER — Telehealth: Payer: Self-pay | Admitting: *Deleted

## 2019-01-24 ENCOUNTER — Other Ambulatory Visit: Payer: Medicare Other

## 2019-01-24 ENCOUNTER — Other Ambulatory Visit: Payer: Self-pay | Admitting: Cardiovascular Disease

## 2019-01-24 LAB — COMPREHENSIVE METABOLIC PANEL
ALT: 16 U/L (ref 0–44)
AST: 10 U/L — ABNORMAL LOW (ref 15–41)
Albumin: 2.9 g/dL — ABNORMAL LOW (ref 3.5–5.0)
Alkaline Phosphatase: 48 U/L (ref 38–126)
Anion gap: 6 (ref 5–15)
BUN: 13 mg/dL (ref 8–23)
CO2: 34 mmol/L — ABNORMAL HIGH (ref 22–32)
Calcium: 8.7 mg/dL — ABNORMAL LOW (ref 8.9–10.3)
Chloride: 99 mmol/L (ref 98–111)
Creatinine, Ser: 0.84 mg/dL (ref 0.44–1.00)
GFR calc Af Amer: >60 mL/min (ref >60)
GFR calc non Af Amer: >60 mL/min (ref >60)
Glucose, Bld: 128 mg/dL — ABNORMAL HIGH (ref 70–99)
Potassium: 4.5 mmol/L (ref 3.5–5.1)
Sodium: 139 mmol/L (ref 135–145)
Total Bilirubin: 1 mg/dL (ref 0.3–1.2)
Total Protein: 5.8 g/dL — ABNORMAL LOW (ref 6.5–8.1)

## 2019-01-24 LAB — CBC WITH DIFFERENTIAL/PLATELET
Abs Immature Granulocytes: 0.07 10*3/uL (ref 0.00–0.07)
Basophils Absolute: 0 10*3/uL (ref 0.0–0.1)
Basophils Relative: 1 %
Eosinophils Absolute: 0 10*3/uL (ref 0.0–0.5)
Eosinophils Relative: 0 %
HCT: 25.4 % — ABNORMAL LOW (ref 36.0–46.0)
Hemoglobin: 7.6 g/dL — ABNORMAL LOW (ref 12.0–15.0)
Immature Granulocytes: 2 %
Lymphocytes Relative: 26 %
Lymphs Abs: 1.1 10*3/uL (ref 0.7–4.0)
MCH: 32.3 pg (ref 26.0–34.0)
MCHC: 29.9 g/dL — ABNORMAL LOW (ref 30.0–36.0)
MCV: 108.1 fL — ABNORMAL HIGH (ref 80.0–100.0)
Monocytes Absolute: 0.5 10*3/uL (ref 0.1–1.0)
Monocytes Relative: 11 %
Neutro Abs: 2.6 10*3/uL (ref 1.7–7.7)
Neutrophils Relative %: 60 %
Platelets: 68 10*3/uL — ABNORMAL LOW (ref 150–400)
RBC: 2.35 MIL/uL — ABNORMAL LOW (ref 3.87–5.11)
RDW: 22.4 % — ABNORMAL HIGH (ref 11.5–15.5)
WBC: 4.2 10*3/uL (ref 4.0–10.5)
nRBC: 9.6 % — ABNORMAL HIGH (ref 0.0–0.2)

## 2019-01-24 LAB — MAGNESIUM: Magnesium: 1.7 mg/dL (ref 1.7–2.4)

## 2019-01-24 LAB — BRAIN NATRIURETIC PEPTIDE: B Natriuretic Peptide: 1387.4 pg/mL — ABNORMAL HIGH (ref 0.0–100.0)

## 2019-01-24 LAB — PROCALCITONIN: Procalcitonin: <0.1 ng/mL

## 2019-01-24 MED ORDER — FUROSEMIDE 10 MG/ML IJ SOLN
40.0000 mg | Freq: Two times a day (BID) | INTRAMUSCULAR | Status: DC
  Administered 2019-01-24 – 2019-01-27 (×7): 40 mg via INTRAVENOUS
  Filled 2019-01-24 (×7): qty 4

## 2019-01-24 NOTE — Progress Notes (Signed)
PROGRESS NOTE    [name]  ZES:[id number] DOB: 08-Jan-1946 DOA: 01/22/2019 PCP: Elby Showers, MD   Brief Narrative:  73 year old female who presented with dyspnea and weakness.  She does have significant past medical history for myelodysplastic syndrome and nonischemic cardiomyopathy systolic dysfunction ejection fraction 25 to 30%.  She was seen by her primary care provider on the day of admission, she was found hypoxic down to 80% on room air and her hemoglobin was down to 6.1, she was placed on supplemental oxygen and transferred to the hospital for further evaluation.  She physical examination blood pressure was 103/35, heart rate 77, respiratory rate 20, temperature 98.4, oxygen saturation 96%, her lungs were showing rhonchi bilaterally, heart S1-S2 present and rhythmic, abdomen was soft, no lower extremity edema.  Sodium 132, potassium 3.6, chloride 83, bicarb 31, glucose 122, BUN 22, creatinine 1.0, white count 3.8, hemoglobin 6.1, hematocrit 19.0, platelets 65.  Urinalysis negative for infection.  Her chest x-ray had bilateral nodular infiltrates.  SARS COVID-19 negative.   Patient was admitted to hospital working diagnosis of acute hypoxic respiratory failure due to multifocal pneumonia.  Consultants:   None  Procedures:   None  Antimicrobials:   IV Rocephin and Zithromax started on 01/22/2019   Subjective: Patient seen and examined.  She restarts that she feels better.  Her breathing is better but she is still on 2 L of oxygen and she states that she feels dyspneic if she is not on oxygen.  She does not use oxygen at home routinely.  Does not have any other complaints such as chest pain, fever, chills or sweating.  Objective: Vitals:   01/23/19 2034 01/24/19 0522 01/24/19 0826 01/24/19 0828  BP: (!) 113/52 122/64    Pulse: 82 83    Resp: 18 20    Temp: 98.8 F (37.1 C) 99.3 F (37.4 C)    TempSrc: Oral Oral    SpO2: 97% 96% 93% 93%  Weight:      Height:          Intake/Output Summary (Last 24 hours) at 01/24/2019 1207 Last data filed at 01/24/2019 0600 Gross per 24 hour  Intake 335.97 ml  Output --  Net 335.97 ml   Filed Weights   01/22/19 1242  Weight: 86.6 kg    Examination:  General exam: Appears calm and comfortable on 2 L of nasal cannula oxygen Respiratory system: Bilateral crackles in all lobes along with some scant rhonchi bilaterally Cardiovascular system: S1 & S2 heard, RRR. No JVD, murmurs, rubs, gallops or clicks.  +2 pitting edema bilateral lower extremity Gastrointestinal system: Abdomen is nondistended, soft and nontender. No organomegaly or masses felt. Normal bowel sounds heard. Central nervous system: Alert and oriented. No focal neurological deficits. Extremities: Symmetric 5 x 5 power. Skin: No rashes, lesions or ulcers Psychiatry: Judgement and insight appear normal. Mood & affect appropriate.    Data Reviewed: I have personally reviewed following labs and imaging studies  CBC: Recent Labs  Lab 01/22/19 1247 01/22/19 2236 01/23/19 0131 01/23/19 1205  WBC 3.8*  --  3.7*  --   NEUTROABS 2.3  --   --   --   HGB 6.1* 7.0* 6.8* 8.0*  HCT 19.0* 22.4* 21.9* 25.1*  MCV 106.7*  --  105.8*  --   PLT 65*  --  65*  --    Basic Metabolic Panel: Recent Labs  Lab 01/22/19 1247 01/23/19 0131  NA 132* 137  K 3.6 4.1  CL  93* 97*  CO2 31 31  GLUCOSE 122* 125*  BUN 22 19  CREATININE 1.03* 0.93  CALCIUM 8.5* 8.3*   GFR: Estimated Creatinine Clearance: 52.3 mL/min (by C-G formula based on SCr of 0.93 mg/dL). Liver Function Tests: Recent Labs  Lab 01/22/19 1247  AST 11*  ALT 21  ALKPHOS 55  BILITOT 2.0*  PROT 6.1*  ALBUMIN 3.1*   No results for input(s): LIPASE, AMYLASE in the last 168 hours. No results for input(s): AMMONIA in the last 168 hours. Coagulation Profile: No results for input(s): INR, PROTIME in the last 168 hours. Cardiac Enzymes: Recent Labs  Lab 01/22/19 1258  TROPONINI <0.03    BNP (last 3 results) No results for input(s): PROBNP in the last 8760 hours. HbA1C: No results for input(s): HGBA1C in the last 72 hours. CBG: No results for input(s): GLUCAP in the last 168 hours. Lipid Profile: No results for input(s): CHOL, HDL, LDLCALC, TRIG, CHOLHDL, LDLDIRECT in the last 72 hours. Thyroid Function Tests: No results for input(s): TSH, T4TOTAL, FREET4, T3FREE, THYROIDAB in the last 72 hours. Anemia Panel: No results for input(s): VITAMINB12, FOLATE, FERRITIN, TIBC, IRON, RETICCTPCT in the last 72 hours. Sepsis Labs: Recent Labs  Lab 01/22/19 1246 01/22/19 2236  LATICACIDVEN 1.4 0.9    Recent Results (from the past 240 hour(s))  Culture, blood (routine x 2)     Status: None (Preliminary result)   Collection Time: 01/22/19 12:51 PM  Result Value Ref Range Status   Specimen Description   Final    BLOOD RIGHT ANTECUBITAL Performed at [facility], [place] [address], [place], [place] [address]    Special Requests   Final    BOTTLES DRAWN AEROBIC AND ANAEROBIC Blood Culture results may not be optimal due to an excessive volume of blood received in culture bottles Performed at [facility] [address] [place], [place] [address]    Culture   Final    NO GROWTH < 24 HOURS Performed at [facility] [address], [place], [place] [address]    Report Status PENDING  Incomplete  Culture, blood (routine x 2)     Status: None (Preliminary result)   Collection Time: 01/22/19 12:56 PM  Result Value Ref Range Status   Specimen Description   Final    BLOOD RIGHT ANTECUBITAL Performed at [facility], [place] [address], [place], [place] [address]    Special Requests   Final    BOTTLES DRAWN AEROBIC AND ANAEROBIC Blood Culture adequate volume Performed at [facility] [address] [place], [place] [address]    Culture   Final    NO GROWTH < 24 HOURS Performed at [facility] [address] [place], [place] [address]    Report Status PENDING  Incomplete  SARS Coronavirus 2 (CEPHEID- Performed in [facility] hospital lab), Hosp Order     Status: None   Collection Time: 01/22/19 12:59 PM  Result Value Ref Range Status   SARS Coronavirus 2 NEGATIVE NEGATIVE Final    Comment: (NOTE) If result is NEGATIVE SARS-CoV-2 target nucleic acids are NOT DETECTED. The SARS-CoV-2 RNA is generally detectable in upper and lower  respiratory specimens during the acute phase of infection. The lowest  concentration of SARS-CoV-2 viral copies this assay can detect is 250  copies / mL. A negative result does not preclude SARS-CoV-2 infection  and should not be used as the sole basis for treatment or  other  patient management decisions.  A negative result may occur with  improper specimen collection / handling, submission of specimen other  than nasopharyngeal swab, presence of viral mutation(s) within the  areas targeted by this assay, and inadequate number of viral copies  (<250 copies / mL). A negative result must be combined with clinical  observations, patient history, and epidemiological information. If result is POSITIVE SARS-CoV-2 target nucleic acids are DETECTED. The SARS-CoV-2 RNA is generally detectable in upper and lower  respiratory specimens dur ing the acute phase of infection.  Positive  results are indicative of active infection with SARS-CoV-2.  Clinical  correlation with patient history and other diagnostic information is  necessary to determine patient infection status.  Positive results do  not rule out bacterial infection or co-infection with other viruses. If result is PRESUMPTIVE POSTIVE SARS-CoV-2 nucleic acids MAY BE PRESENT.   A presumptive positive result was obtained on the submitted specimen  and confirmed on repeat testing.  While 2019 novel coronavirus  (SARS-CoV-2) nucleic acids may be present in the submitted sample  additional confirmatory testing may be  necessary for epidemiological  and / or clinical management purposes  to differentiate between  SARS-CoV-2 and other Sarbecovirus currently known to infect humans.  If clinically indicated additional testing with an alternate test  methodology [phone]) is advised. The SARS-CoV-2 RNA is generally  detectable in upper and lower respiratory sp ecimens during the acute  phase of infection. The expected result is Negative. Fact Sheet for Patients:  StrictlyIdeas.no Fact Sheet for Healthcare Providers: BankingDealers.co.za This test is not yet approved or cleared by the [place] FDA and has been authorized for detection and/or diagnosis of SARS-CoV-2 by FDA under an Emergency Use Authorization (EUA).  This EUA will remain in effect (meaning this test can be used) for the duration of the COVID-19 declaration under Section 564(b)(1) of the Act, 21 U.S.C. section 360bbb-3(b)(1), unless the authorization is terminated or revoked sooner. Performed at [facility], [place] [address], [place], [place] [address]       Radiology Studies: Ct Angio Chest Pe W Or Wo Contrast  Result Date: 01/23/2019 CLINICAL DATA:  Acute hypoxic respiratory failure. EXAM: CT ANGIOGRAPHY CHEST WITH CONTRAST TECHNIQUE: Multidetector CT imaging of the chest was performed using the standard protocol during bolus administration of intravenous contrast. Multiplanar CT image reconstructions and MIPs were obtained to evaluate the vascular anatomy. CONTRAST:  168mL OMNIPAQUE IOHEXOL 350 MG/ML SOLN COMPARISON:  Chest x-ray from yesterday. FINDINGS: Cardiovascular: Satisfactory opacification of the pulmonary arteries to the segmental level. No evidence of pulmonary embolism. Enlarged main pulmonary artery measuring up to 4.0 cm. Moderate cardiomegaly. No pericardial effusion. No thoracic aortic aneurysm or dissection. Mild atherosclerotic calcification of the aortic  arch. Left chest wall pacemaker. Mediastinum/Nodes: Mildly enlarged AP window lymph node measuring 1.5 cm in short axis. No additional enlarged mediastinal lymph nodes. No enlarged hilar or axillary lymph nodes. The thyroid gland, trachea, and esophagus demonstrate no significant findings. Lungs/Pleura: Moderate diffuse patchy peribronchovascular ground-glass densities and consolidation throughout both lungs. No pneumothorax. Upper Abdomen: No acute abnormality.  Cholelithiasis. Musculoskeletal: No chest wall abnormality. No acute or significant osseous findings. Review of the MIP images confirms the above findings. IMPRESSION: 1. Moderate diffuse patchy peribronchovascular ground-glass densities and consolidation throughout both lungs could reflect pulmonary edema or multifocal pneumonia. 2.  No evidence of pulmonary embolism. 3. Trace bilateral pleural effusions. 4. Moderately enlarged main pulmonary artery, suggestive of pulmonary arterial hypertension. 5. Mildly  enlarged AP window lymph node, likely reactive. 6.  Aortic atherosclerosis (ICD10-I70.0). 7. Cholelithiasis. Electronically Signed   By: Titus Dubin M.D.   On: 01/23/2019 15:42    Scheduled Meds:  carvedilol  25 mg Oral BID WC   enoxaparin (LOVENOX) injection  40 mg Subcutaneous I45Y   folic acid  1 mg Oral Daily   ipratropium-albuterol  3 mL Nebulization TID   levothyroxine  150 mcg Oral Q0600   mouth rinse  15 mL Mouth Rinse BID   pantoprazole  40 mg Oral Daily   sodium chloride flush  3 mL Intravenous Once   vitamin B-12  50 mcg Oral Daily   Continuous Infusions:  sodium chloride Stopped (01/22/19 1935)   azithromycin Stopped (01/23/19 1700)   cefTRIAXone (ROCEPHIN)  IV Stopped (01/23/19 1824)     LOS: 2 days   Assessment & Plan:   Principal Problem:   Acute respiratory failure with hypoxia (HCC) Active Problems:   Hypothyroidism   Essential hypertension   Symptomatic anemia   MDS (myelodysplastic  syndrome) (HCC)   Multifocal pneumonia   Respiratory failure (HCC)  Acute hypoxic respiratory failure secondary to multifocal pneumonia versus acute on chronic systolic congestive heart failure: Patient has been tested negative for COVID.  She does not have any fever or leukocytosis.  Based on my clinical examination, I doubt bacterial pneumonia.  She most likely has acute on chronic systolic congestive heart failure as she is known to have 25% of ejection fraction (this is supported by elevated BNP at the time of admission) or she may be having some vital pneumonia.  I do not see procalcitonin or respiratory viral panel so I will order both and I will also repeat her BNP today.  She takes Lasix 40 mg p.o. daily at home which has been on hold here for some reason.  I will start her on Lasix 40 mg IV twice daily.  Continue PRN breathing treatments.  #2 acute on chronic anemia secondary to MDS hemoglobin 6.1 at the time of admission, she has received 2 units of PRBC transfusion so far.  Hemoglobin currently around 9.  #3 hypertension: Her blood pressure has been normal and her Cozaar has been on hold.  Continue carvedilol and monitor closely.  #4 hypothyroidism continue Synthroid.  DVT prophylaxis: Lovenox Code Status: Full code Family Communication: Plan discussed with the patient.  No family present. Disposition Plan: To be determined.  Potential discharge in next 24 to 48 hours.   Time spent: 30 minutes   Darliss Cheney, MD [facility] Pager [phone]  If 7PM-7AM, please contact night-coverage [email or web address] Password [facility] 01/24/2019, 12:07 PM

## 2019-01-24 NOTE — TOC Initial Note (Signed)
Transition of Care [facility]) - Initial/Assessment Note    Patient Details  Name: [name] MRN: [id number] Date of Birth: 11-16-1945  Transition of Care [facility]) CM/SW Contact:    Purcell Mouton, RN Phone Number: 01/24/2019, 3:31 PM  Clinical Narrative:   Pt admitted Acute respiratory failure with hypoxia.                Expected Discharge Plan: [place]     Patient Goals and CMS Choice Plan to discharge home     Choice offered to / list presented to : Patient  Expected Discharge Plan and Services Expected Discharge Plan: [place]   Discharge Planning Services: CM Consult   Living arrangements for the past 2 months: Single Family Home Expected Discharge Date: (unknown)                                    Prior Living Arrangements/Services Living arrangements for the past 2 months: Single Family Home Lives with:: Self Patient language and need for interpreter reviewed:: No Do you feel safe going back to the place where you live?: Yes               Activities of Daily Living Home Assistive Devices/Equipment: Eyeglasses, CBG Meter, Nebulizer ADL Screening (condition at time of admission) Patient's cognitive ability adequate to safely complete daily activities?: Yes Is the patient deaf or have difficulty hearing?: No Does the patient have difficulty seeing, even when wearing glasses/contacts?: No Does the patient have difficulty concentrating, remembering, or making decisions?: No Patient able to express need for assistance with ADLs?: Yes Does the patient have difficulty dressing or bathing?: No Independently performs ADLs?: Yes (appropriate for developmental age) Does the patient have difficulty walking or climbing stairs?: Yes(secondary to shortness of breath) Weakness of Legs: Both Weakness of Arms/Hands: Both  Permission Sought/Granted Permission sought to share information with : Case Manager                 Emotional Assessment Appearance:: Appears stated age   Affect (typically observed): Accepting Orientation: : Oriented to Self, Oriented to Place, Oriented to  Time, Oriented to Situation      Admission diagnosis:  Acute respiratory failure with hypoxia (HCC) [J96.01] Symptomatic anemia [D64.9] Patient Active Problem List   Diagnosis Date Noted  . Respiratory failure ([place]) 01/23/2019  . Multifocal pneumonia 01/22/2019  . Acute respiratory failure with hypoxia ([place])   . MDS (myelodysplastic syndrome) ([place]) 12/27/2018  . Iron deficiency 09/21/2018  . Symptomatic anemia 08/03/2018  . Impaired glucose tolerance 06/21/2017  . Cough variant asthma  vs UACS from ACEi  12/31/2016  . Essential hypertension 12/31/2016  . Morbid obesity due to excess calories ([place]) 12/31/2016  . History of colonic polyps   . Benign neoplasm of descending colon   . Diverticulosis of colon without hemorrhage 01/28/2014  . Serrated adenoma of colon 01/28/2014  . Nonspecific abnormal finding in stool contents 11/29/2013  . Colon cancer screening 11/29/2013  . History of depression 08/11/2012  . Chronic systolic CHF (congestive heart failure) ([place])   . Systolic CHF, chronic ([place])   . LBBB (left bundle branch block)   . Automatic implantable cardioverter-defibrillator in situ   . Hypothyroidism   . GERD (gastroesophageal reflux disease)   . Dyslipidemia   . Mitral regurgitation   . Overweight   . Ejection fraction < 50%  PCP:  Elby Showers, MD Pharmacy:   [id number] [place], [place] [address] [address] Phone: [phone] Fax: [place], [place] [address] Phone: [phone] Fax: [phone]  [facility] No address on file   [place], [place] [address] Phone:  [phone] Fax: [phone]     Social Determinants of Health (SDOH) Interventions    Readmission Risk Interventions No flowsheet data found.

## 2019-01-24 NOTE — Progress Notes (Signed)
Physical Therapy Treatment Patient Details Name: [name] MRN: [id number] DOB: 1946-03-13 Today's Date: 01/24/2019     SATURATION QUALIFICATIONS: (This note is used to comply with regulatory documentation for home oxygen)  Patient Saturations on Room Air at Rest =83%  Patient Saturations on Room Air while Ambulating = n/a  Patient Saturations on 3 Liters of oxygen while Ambulating = 92%    History of Present Illness 73 yo female admitted to ED on 5/11 with ShOB, hypoxia, with PNA sent from MD. 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 PMH includes pacemaker, GERD, EF 25-30% 2010, MVR, cardiomyopathy, depression, CHF.     PT Comments    Progressing with mobility. Pt was Min guard-Min assist for mobility on today. Per chart, she is politely declining HHPT f/u. Spoke with pt about this and she feels she will be fine at home. Will continue to follow and progress activity as tolerated.    Follow Up Recommendations  Home health PT;Supervision for mobility/OOB(pt politely declines HHPT f/u)     Equipment Recommendations  None recommended by PT    Recommendations for Other Services       Precautions / Restrictions Precautions Precautions: Fall Precaution Comments: watch sats  Restrictions Weight Bearing Restrictions: No    Mobility  Bed Mobility Overal bed mobility: Needs Assistance Bed Mobility: Supine to Sit;Sit to Supine     Supine to sit: Supervision;HOB elevated Sit to supine: Supervision;HOB elevated   General bed mobility comments: for safety, lines  Transfers Overall transfer level: Needs assistance Equipment used: Rolling walker (2 wheeled) Transfers: Sit to/from Stand Sit to Stand: Min assist         General transfer comment: Min assist for power up. VCs safety, hand placement.   Ambulation/Gait Ambulation/Gait assistance: Min guard Gait Distance (Feet): 60 Feet Assistive device: Rolling walker (2 wheeled) Gait Pattern/deviations: Step-through pattern;Decreased  stride length     General Gait Details: close guard for safety. O2 sat 92% on 3L [place] O2. Dyspnea 2/4. Pt fatigues fairly easily.    Stairs             Wheelchair Mobility    Modified Rankin (Stroke Patients Only)       Balance Overall balance assessment: Needs assistance         Standing balance support: Bilateral upper extremity supported Standing balance-Leahy Scale: Poor                              Cognition Arousal/Alertness: Awake/alert Behavior During Therapy: WFL for tasks assessed/performed Overall Cognitive Status: Within Functional Limits for tasks assessed                                        Exercises      General Comments        Pertinent Vitals/Pain Pain Assessment: No/denies pain    Home Living                      Prior Function            PT Goals (current goals can now be found in the care plan section) Progress towards PT goals: Progressing toward goals    Frequency    Min 3X/week      PT Plan Current plan remains appropriate    Co-evaluation  AM-PAC PT "6 Clicks" Mobility   Outcome Measure  Help needed turning from your back to your side while in a flat bed without using bedrails?: A Little Help needed moving from lying on your back to sitting on the side of a flat bed without using bedrails?: A Little Help needed moving to and from a bed to a chair (including a wheelchair)?: A Little Help needed standing up from a chair using your arms (e.g., wheelchair or bedside chair)?: A Little Help needed to walk in hospital room?: A Little Help needed climbing 3-5 steps with a railing? : A Little 6 Click Score: 18    End of Session Equipment Utilized During Treatment: Oxygen Activity Tolerance: Patient tolerated treatment well Patient left: in bed;with call bell/phone within reach;with bed alarm set   PT Visit Diagnosis: Other abnormalities of gait and mobility  (R26.89);Muscle weakness (generalized) (M62.81);Difficulty in walking, not elsewhere classified (R26.2)     Time: 0174-9449 PT Time Calculation (min) (ACUTE ONLY): 19 min  Charges:  $Gait Training: 8-22 mins             Weston Anna, PT Acute Rehabilitation Services Pager: [phone] Office: [phone]

## 2019-01-24 NOTE — Telephone Encounter (Signed)
Pt called to inform Dr. Walden Field that she is currently in hospital and probably will not be coming to her appt on 01/25/19. Nurse noted pt was admitted on 01/22/19 to  [place]. Pt's    Phone     [phone].

## 2019-01-24 NOTE — Progress Notes (Signed)
Spoke with pt concerning discharge plans and HH.  Pt states let me think about it. Will continue to follow.

## 2019-01-25 ENCOUNTER — Inpatient Hospital Stay: Payer: Medicare Other

## 2019-01-25 ENCOUNTER — Inpatient Hospital Stay: Payer: Medicare Other | Admitting: Internal Medicine

## 2019-01-25 DIAGNOSIS — I429 Cardiomyopathy, unspecified: Secondary | ICD-10-CM

## 2019-01-25 DIAGNOSIS — I110 Hypertensive heart disease with heart failure: Principal | ICD-10-CM

## 2019-01-25 DIAGNOSIS — I5022 Chronic systolic (congestive) heart failure: Secondary | ICD-10-CM

## 2019-01-25 DIAGNOSIS — D539 Nutritional anemia, unspecified: Secondary | ICD-10-CM

## 2019-01-25 DIAGNOSIS — J9601 Acute respiratory failure with hypoxia: Secondary | ICD-10-CM

## 2019-01-25 DIAGNOSIS — D469 Myelodysplastic syndrome, unspecified: Secondary | ICD-10-CM

## 2019-01-25 DIAGNOSIS — E039 Hypothyroidism, unspecified: Secondary | ICD-10-CM

## 2019-01-25 LAB — RESPIRATORY PANEL BY PCR

## 2019-01-25 LAB — COMPREHENSIVE METABOLIC PANEL
ALT: 14 U/L (ref 0–44)
AST: 9 U/L — ABNORMAL LOW (ref 15–41)
Albumin: 2.7 g/dL — ABNORMAL LOW (ref 3.5–5.0)
Alkaline Phosphatase: 40 U/L (ref 38–126)
Anion gap: 9 (ref 5–15)
BUN: 13 mg/dL (ref 8–23)
CO2: 35 mmol/L — ABNORMAL HIGH (ref 22–32)
Calcium: 8.3 mg/dL — ABNORMAL LOW (ref 8.9–10.3)
Chloride: 94 mmol/L — ABNORMAL LOW (ref 98–111)
Creatinine, Ser: 0.79 mg/dL (ref 0.44–1.00)
GFR calc Af Amer: >60 mL/min (ref >60)
GFR calc non Af Amer: >60 mL/min (ref >60)
Glucose, Bld: 121 mg/dL — ABNORMAL HIGH (ref 70–99)
Potassium: 4.3 mmol/L (ref 3.5–5.1)
Sodium: 138 mmol/L (ref 135–145)
Total Bilirubin: 1.3 mg/dL — ABNORMAL HIGH (ref 0.3–1.2)
Total Protein: 5.5 g/dL — ABNORMAL LOW (ref 6.5–8.1)

## 2019-01-25 LAB — CBC WITH DIFFERENTIAL/PLATELET
Abs Immature Granulocytes: 0.09 10*3/uL — ABNORMAL HIGH (ref 0.00–0.07)
Basophils Absolute: 0 10*3/uL (ref 0.0–0.1)
Basophils Relative: 1 %
Eosinophils Absolute: 0 10*3/uL (ref 0.0–0.5)
Eosinophils Relative: 0 %
HCT: 22.7 % — ABNORMAL LOW (ref 36.0–46.0)
Hemoglobin: 7 g/dL — ABNORMAL LOW (ref 12.0–15.0)
Immature Granulocytes: 2 %
Lymphocytes Relative: 25 %
Lymphs Abs: 1 10*3/uL (ref 0.7–4.0)
MCH: 33.2 pg (ref 26.0–34.0)
MCHC: 30.8 g/dL (ref 30.0–36.0)
MCV: 107.6 fL — ABNORMAL HIGH (ref 80.0–100.0)
Monocytes Absolute: 0.4 10*3/uL (ref 0.1–1.0)
Monocytes Relative: 11 %
Neutro Abs: 2.4 10*3/uL (ref 1.7–7.7)
Neutrophils Relative %: 61 %
Platelets: 61 10*3/uL — ABNORMAL LOW (ref 150–400)
RBC: 2.11 MIL/uL — ABNORMAL LOW (ref 3.87–5.11)
RDW: 21.2 % — ABNORMAL HIGH (ref 11.5–15.5)
WBC: 3.9 10*3/uL — ABNORMAL LOW (ref 4.0–10.5)
nRBC: 5.1 % — ABNORMAL HIGH (ref 0.0–0.2)

## 2019-01-25 LAB — TYPE AND SCREEN
ABO/RH(D): O POS
Antibody Screen: NEGATIVE

## 2019-01-25 LAB — PREPARE RBC (CROSSMATCH)

## 2019-01-25 LAB — MAGNESIUM: Magnesium: 1.6 mg/dL — ABNORMAL LOW (ref 1.7–2.4)

## 2019-01-25 MED ORDER — AZITHROMYCIN 250 MG PO TABS
500.0000 mg | ORAL_TABLET | ORAL | Status: DC
  Administered 2019-01-25 – 2019-01-28 (×4): 500 mg via ORAL
  Filled 2019-01-25 (×4): qty 2

## 2019-01-25 MED ORDER — SODIUM CHLORIDE 0.9% IV SOLUTION
Freq: Once | INTRAVENOUS | Status: AC
  Administered 2019-01-25: 17:00:00 via INTRAVENOUS

## 2019-01-25 NOTE — Care Management Important Message (Signed)
Important Message  Patient Details IM Letter given to Cookie [place] Case Manager to present to the Patient Name: KEAISHA SUBLETTE MRN: [id number] Date of Birth: January 05, 1946   Medicare Important Message Given:  Yes    Janese, Radabaugh 01/25/2019, 9:30 AMImportant Message  Patient Details  Name: BRANDYE INTHAVONG MRN: [id number] Date of Birth: [birth year]   Medicare Important Message Given:  Yes    Sereen, Schaff 01/25/2019, 9:30 AM

## 2019-01-25 NOTE — Progress Notes (Signed)
Diagnosis Acute respiratory failure with hypoxia (HCC)  Symptomatic anemia - Plan: heparin lock flush 100 unit/mL, heparin lock flush 100 unit/mL  Staging Cancer Staging No matching staging information was found for the patient.  Assessment and Plan:   1.  Myelodysplastic syndrome, Excess blasts.  73 year old previously followed by Dr. Audelia Hives.  Pt has past medical history significant for dyslipidemia; compensated systolic congestive heart failure; placement of a defibrillator in 2011; nonischemic cardiomyopathy; bilateral nonsurgical cataracts; hypothyroid disease; mitral regurgitation; elevated BMI; diverticular disease; gastroesophageal reflux disease with dysphagia; and a serrated sessile polyp (2) identified initially 3 years earlier. Her primary care physician is Dr. Tedra Senegal.  Her gastroenterologist is Dr. Zenovia Jarred.  Over the past 6 months, she was aware that she was anemic.   On May 25, 2018: A complete blood count showed hemoglobin 9.6 hematocrit 28.8 MCV 101.4 MCH 33.8 RDW 17.9 WBC 3.8 with 39% neutrophils 51% lymphocytes 7% monocytes 2% eosinophils 1% basophil; platelets 338,000.  She  had in the past 2 colonoscopies.  In 2015 a Cologuard was positive.  A serrated sessile polyp was removed from the ascending colon at that time.  A follow-up colonoscopy one year later on February 25, 2015 revealed a second 5 mm sessile serrated polyp in the descending colon. The prior polypectomy site in the ascending colon showed no residual polyp.  She denies any bright red blood per rectum or melena.  She has no significant change in bowel habits although her stools vary from constipation to loose to normal.  She denies abdominal pain or tenesmus.  She has had dyspepsia and dysphagia in spite of omeprazole. She denies odynophagia. Solid food seems to get stuck in her epigastrium.   Pt was treated with IV iron 09/22/2018 and 09/29/2018.    Labs done  12/04/2018 showed WBC 3.5 HB 8.3 plts 249,000.   Chemistries WNL with K+ 3.5 Cr 0.86 and normal LFTs.  Ferritin 408 B12 1081.    HB remained decreased at 8.3 despite IV iron and blood transfusion. She is on folic acid and oral A21 supplements. Pt was recommended for bone marrow aspirate and biopsy for further evaluation due to persistent macrocytosis and anemia to rule out MDS.     Bone marrow biopsy done 12/15/2018 reviewed and showed  Bone Marrow, Aspirate,Biopsy, and Clot, right iliac BONE MARROW: - HYPERCELLULAR MARROW WITH MULTILINEAGE DYSPLASIA, RING SIDEROBLASTS AND INCREASED BLASTS (5-9% BY CD34 IMMUNOHISTOCHEMISTRY) - SEE COMMENT PERIPHERAL BLOOD: - MACROCYTIC ANEMIA - SEE COMPLETE BLOOD COUNT Diagnosis Note The overall features in the marrow are consistent with a myelodysplastic syndrome with excess blasts-1 (MDS-EB-1); correlation with cytogenetics and FISH (MDS panel) is recommended.  Cytogenetics were normal.    Previously, I discussed with her diagnosis of MDS.  Based on IPSS scoring system she would be intermediate to high risk due to % of blasts (5-9%) and anemia.  Usually in higher risk pts allogeneic SCT offers the greatest potential for long-term disease control, but may cause substantial toxicity. Hypomethylating agents are associated with less toxicity than either transplantation or intensive chemotherapy and can provide symptomatic improvement but do not offer the possibility of cure.  Based on her significant cardiomyopathy with recent ECHO done 07/17/2018 showing EF of 25-30%, she would not likely be a candidate for SCT.    Pt was recommended for Vidaza as an option for pt not suitable for transplant and provides symptomatic improvement with tolerability of therapy and some survival benefit but does not offer cure.  Goals of  therapy reviewed and are for disease control.    Pt was treated with Vidaza for 5 days on 01/08/2019 to 01/12/2019.    Pt was hospitalized on 01/22/2019 due to anemia and pulmonary infiltrates.     Labs done today 01/25/2019 reviewed and showed WBC 3.9 HB 7 and plts 61,000.  Chemistries shows K+ 4.3 Cr 0.79 and normal LFTs.     Today, I discussed with pt she was hospitalized for evaluation of possible infection as well as anemia.  Anemia due to MDS and unlikely to improve significantly due to status of disease.  Pt will need chronic transfusion support.  Option to transfuse 1 Unit if HB 7 or less.  Have discussed this also with Dr. Doristine Bosworth.  Will arrange follow-up once pt discharged.    2.  Pulmonary infiltrates.  Pt on Rocephin and Zithromax since 01/22/2019.  Pt afebrile with normal WBC.  SARS COVID-19 testing negative.    CXR done 01/22/2019 showed  IMPRESSION: Multifocal airspace consolidation. Suspect multifocal pneumonia. Atypical organism pneumonia a differential consideration.  There is pulmonary vascular congestion. No appreciable interstitial edema. Pacemaker leads attached to right atrium and right ventricle.  Ongoing management per hospitalist team.    3.  Cardiomyopathy.  Pt has defibrillator in place.  She is followed by  Cardiology.  Recent Echo done 07/17/2018 showed EF 25-30%.  Follow-up with Cardiology as directed.    4. 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  Option to transfuse 1 Unit if HB 7 or less. She will need chronic transfusion support based on HB and symptoms.   Have discussed this also with Dr. Doristine Bosworth.  Will arrange follow-up once pt discharged.    5  Hypothyroidism.  Recent TSH WNL.  Follow-up with PCP for monitoring.   6.  HTN.  BP is 122/51.     Subjective: Pt denies complaints.    Objective: Vitals Patient Vitals for the past 24 hrs:  BP Temp Temp src Pulse Resp SpO2  01/25/19 1041 (!) 122/51 - - 72 - -  01/25/19 0753 - - - - - 95 %  01/25/19 0752 - - - - - 95 %  01/25/19 0444 (!) 131/49 98.5 F (36.9 C) Oral 76 18 96 %  01/24/19 2119 (!) 119/49  98.6 F (37 C) Oral 78 20 91 %  01/24/19 2023 - - - - - 97 %  01/24/19 1433 127/71 98.9 F (37.2 C) Oral 81 16 95 %    Physical Exam Constitutional: Well-developed, well-nourished, and in no distress.   HENT: Head: Normocephalic and atraumatic.  Mouth/Throat: No oropharyngeal exudate. Mucosa moist. Eyes: Pupils are equal, round, and reactive to light. Conjunctivae are normal. No scleral icterus.  Neck: Normal range of motion. Neck supple. No JVD present.  Cardiovascular: Normal rate, regular rhythm and normal heart sounds.  Exam reveals no gallop and no friction rub.   No murmur heard. Pulmonary/Chest: Effort normal and breath sounds normal. No respiratory distress. No wheezes.No rales.  Abdominal: Soft. Bowel sounds are normal. No distension. There is no tenderness. There is no guarding.  Musculoskeletal: No edema or tenderness.  Lymphadenopathy: No cervical, axillary or supraclavicular adenopathy.  Neurological: Alert and oriented to person, place, and time. No cranial nerve deficit.  Skin: Skin is warm and dry. No rash noted. No erythema. No pallor.  Psychiatric: Affect and judgment normal.    Medications:  Reviewed.  Pt on Rocephin and Azithromycin since 01/22/2019.     Allergies Patient has no known allergies.  Review of Systems Review of Systems - Oncology ROS negative   Labs No results displayed because visit has over 200 results.       Pathology Orders Placed This Encounter  Procedures  . Critical Care    This order was created via procedure documentation    Standing Status:   Standing    Number of Occurrences:   1  . Culture, blood (routine x 2)    Standing Status:   Standing    Number of Occurrences:   2  . SARS Coronavirus 2 (CEPHEID- Performed in [place] hospital lab), Hosp Order    Standing Status:   Standing    Number of Occurrences:   1  . Respiratory Panel by PCR    Standing Status:   Standing    Number of Occurrences:   1  . CT ANGIO CHEST  PE W OR WO CONTRAST    Standing Status:   Standing    Number of Occurrences:   1    Order Specific Question:   Does the patient have a contrast media/X-ray dye allergy?    Answer:   No    Order Specific Question:   If indicated for the ordered procedure, I authorize the administration of contrast media per Radiology protocol    Answer:   Yes    Order Specific Question:   Radiology Contrast Protocol - do NOT remove file path    Answer:   [email or web address]  . Lactic acid, plasma    Standing Status:   Standing    Number of Occurrences:   2  . Comprehensive metabolic panel    Standing Status:   Standing    Number of Occurrences:   1  . CBC with Differential    Standing Status:   Standing    Number of Occurrences:   1  . Urinalysis, Routine w reflex microscopic    Standing Status:   Standing    Number of Occurrences:   1  . Brain natriuretic peptide    Standing Status:   Standing    Number of Occurrences:   1  . Troponin I - Once    Standing Status:   Standing    Number of Occurrences:   1  . CBC    Standing Status:   Standing    Number of Occurrences:   1  . Basic metabolic panel    Standing Status:   Standing    Number of Occurrences:   1  . Hemoglobin and hematocrit, blood    Please draw 3 hours after blood transfusion    Standing Status:   Standing    Number of Occurrences:   1  . Hemoglobin and hematocrit, blood    Post blood transfusion    Standing Status:   Standing    Number of Occurrences:   1  . CBC with Differential/Platelet    Standing Status:   Standing    Number of Occurrences:   17  . Comprehensive metabolic panel    Standing Status:   Standing    Number of Occurrences:   2  . Procalcitonin - Baseline    Standing Status:   Standing    Number of Occurrences:   1  . Magnesium    Standing Status:   Standing    Number of Occurrences:   1  . Brain natriuretic peptide  Standing Status:   Standing    Number of Occurrences:   1  .  Magnesium    Standing Status:   Standing    Number of Occurrences:   1  . Diet regular Room service appropriate? Yes; Fluid consistency: Thin    Standing Status:   Standing    Number of Occurrences:   1    Order Specific Question:   Room service appropriate?    Answer:   Yes    Order Specific Question:   Fluid consistency:    Answer:   Thin  . Saline Lock IV, Maintain IV access    Standing Status:   Standing    Number of Occurrences:   1  . Cardiac monitoring    Standing Status:   Standing    Number of Occurrences:   1  . Complete patient signature process for consent form    Standing Status:   Standing    Number of Occurrences:   1  . Practitioner attestation of consent    I, the ordering practitioner, attest that I have discussed with the patient the benefits, risks, side effects, alternatives, likelihood of achieving goals and potential problems during recovery for the procedure listed.    Standing Status:   Standing    Number of Occurrences:   1    Order Specific Question:   Procedure    Answer:   Blood Product  . Vital signs    Standing Status:   Standing    Number of Occurrences:   1  . Notify physician    Standing Status:   Standing    Number of Occurrences:   20    Order Specific Question:   Notify Physician    Answer:   for pulse less than 55 or greater than 120    Order Specific Question:   Notify Physician    Answer:   for respiratory rate less than 12 or greater than 25    Order Specific Question:   Notify Physician    Answer:   for temperature greater than 100.5 F    Order Specific Question:   Notify Physician    Answer:   for urinary output less than 30 mL/hr for four hours    Order Specific Question:   Notify Physician    Answer:   for systolic BP less than 90 or greater than 161, diastolic BP less than 60 or greater than 100  . Initiate Oral Care Protocol    Standing Status:   Standing    Number of Occurrences:   1  . Initiate Carrier Fluid Protocol     Standing Status:   Standing    Number of Occurrences:   1  . RN may order General Admission PRN Orders utilizing "General Admission PRN medications" (through manage orders) for the following patient needs: allergy symptoms (Claritin), cold sores (Carmex), cough (Robitussin DM), eye irritation (Liquifilm Tears), hemorrhoids (Tucks), indigestion (Maalox), minor skin irritation (Hydrocortisone Cream), muscle pain Suezanne Jacquet Gay), nose irritation (saline nasal spray) and sore throat (Chloraseptic spray).    Standing Status:   Standing    Number of Occurrences:   [id number]  . SCDs    Standing Status:   Standing    Number of Occurrences:   1    Order Specific Question:   Laterality    Answer:   Bilateral  . Complete oral assessment tool on admission, transfer and at any change in patient condition.    Standing Status:  Standing    Number of Occurrences:   1  . Oral care with mouthwash and mouth/lip moisturizer as needed    Standing Status:   Standing    Number of Occurrences:   20  . Perform a daily oral assessment to evaluate integrity of the oral cavity/mucosa    Look at lips, oral mucosa, tongue, gums, teeth, hard/soft palate for presence of plaque, dried or coated secretions, signs of infection and/or bleeding.    Standing Status:   Standing    Number of Occurrences:   1  . Practitioner attestation of consent    I, the ordering practitioner, attest that I have discussed with the patient the benefits, risks, side effects, alternatives, likelihood of achieving goals and potential problems during recovery for the procedure listed.    Standing Status:   Standing    Number of Occurrences:   1    Order Specific Question:   Procedure    Answer:   Blood product(s)  . Complete patient signature process for consent form    Standing Status:   Standing    Number of Occurrences:   1  . Practitioner attestation of consent    I, the ordering practitioner, attest that I have discussed with the patient the  benefits, risks, side effects, alternatives, likelihood of achieving goals and potential problems during recovery for the procedure listed.    Standing Status:   Standing    Number of Occurrences:   1    Order Specific Question:   Procedure    Answer:   Blood product(s)  . Complete patient signature process for consent form    Standing Status:   Standing    Number of Occurrences:   1  . Full code    Standing Status:   Standing    Number of Occurrences:   1  . Consult to hospitalist    Standing Status:   Standing    Number of Occurrences:   1    Order Specific Question:   Place call to:    Answer:   [employer] photographer Question:   Reason for Consult    Answer:   Admit  . Droplet precaution    Standing Status:   Standing    Number of Occurrences:   1  . PT eval and treat    Standing Status:   Standing    Number of Occurrences:   1  . Pulse oximetry, continuous    Standing Status:   Standing    Number of Occurrences:   1  . Incentive spirometry RT    Standing Status:   Standing    Number of Occurrences:   1  . Type and screen [place]     Standing Status:   Standing    Number of Occurrences:   1  . Prepare RBC    Standing Status:   Standing    Number of Occurrences:   1    Order Specific Question:   # of Units    Answer:   1 unit    Order Specific Question:   Transfusion Indications    Answer:   Symptomatic Anemia    Order Specific Question:   If emergent release call blood bank    Answer:   Not emergent release  . ABO/Rh    Standing Status:   Standing    Number of Occurrences:   1  .  Prepare RBC    Standing Status:   Standing    Number of Occurrences:   1    Order Specific Question:   # of Units    Answer:   1 unit    Order Specific Question:   Transfusion Indications    Answer:   Symptomatic Anemia    Order Specific Question:   If emergent release call blood bank    Answer:   Not emergent  release    Order Specific Question:   Instructions:    Answer:   Transfuse  . Type and screen [place]     Standing Status:   Standing    Number of Occurrences:   1  . ABO/Rh    Standing Status:   Standing    Number of Occurrences:   1  . Prepare RBC    Standing Status:   Standing    Number of Occurrences:   1    Order Specific Question:   # of Units    Answer:   1 unit    Order Specific Question:   Transfusion Indications    Answer:   Symptomatic Anemia    Order Specific Question:   If emergent release call blood bank    Answer:   Not emergent release  . Admit to Inpatient (patient's expected length of stay will be greater than 2 midnights or inpatient only procedure)    Standing Status:   Standing    Number of Occurrences:   1    Order Specific Question:   Hospital Area    Answer:   [facility] [[id number]]    Order Specific Question:   Level of Care    Answer:   Med-Surg [16]    Order Specific Question:   Covid Evaluation    Answer:   Person Under Investigation (PUI)    Order Specific Question:   Isolation Risk Level    Answer:   Low Risk/Droplet (Less than 4L [place] supplementation)    Order Specific Question:   Diagnosis    Answer:   Multifocal pneumonia [[id number]]    Order Specific Question:   Admitting Physician    Answer:   Georgette Shell [[id number]]    Order Specific Question:   Attending Physician    Answer:   Georgette Shell [[id number]]    Order Specific Question:   Estimated length of stay    Answer:   3 - 4 days    Order Specific Question:   Certification:    Answer:   I certify this patient will need inpatient services for at least 2 midnights    Order Specific Question:   PT Class (Do Not Modify)    Answer:   Inpatient [101]    Order Specific Question:   PT Acc Code (Do Not Modify)    Answer:   Private [1]

## 2019-01-25 NOTE — Progress Notes (Signed)
PHARMACIST - PHYSICIAN COMMUNICATION DR:   Doristine Bosworth CONCERNING: Antibiotic IV to Oral Route Change Policy  RECOMMENDATION: This patient is receiving azithromycin by the intravenous route.  Based on criteria approved by the Pharmacy and Therapeutics Committee, the antibiotic(s) is/are being converted to the equivalent oral dose form(s).   DESCRIPTION: These criteria include:  Patient being treated for a respiratory tract infection, urinary tract infection, cellulitis or clostridium difficile associated diarrhea if on metronidazole  The patient is not neutropenic and does not exhibit a GI malabsorption state  The patient is eating (either orally or via tube) and/or has been taking other orally administered medications for a least 24 hours  The patient is improving clinically and has a Tmax < 100.5  If you have questions about this conversion, please contact the Pharmacy Department  []   [phone] )  [facility] []   [phone] )  [facility] []   [phone] )  [facility] []   [phone] )  [facility] [x]   [phone] )  [place], PharmD, BCPS 01/25/2019 9:47 AM'

## 2019-01-25 NOTE — Progress Notes (Signed)
Patient ambulated to BR on 2L [place], upon returning to bed 02 sats were 80%. Helped patient take deep breaths and placed patient on 3L [place] and removed 02 extension. Satting 92% RR 20.

## 2019-01-25 NOTE — Progress Notes (Signed)
PROGRESS NOTE    [name]  ZYS:[id number] DOB: 07-21-1946 DOA: 01/22/2019 PCP: Elby Showers, MD   Brief Narrative:  73 year old female who presented with dyspnea and weakness.  She does have significant past medical history for myelodysplastic syndrome and nonischemic cardiomyopathy systolic dysfunction ejection fraction 25 to 30%.  She was seen by her primary care provider on the day of admission, she was found hypoxic down to 80% on room air and her hemoglobin was down to 6.1, she was placed on supplemental oxygen and transferred to the hospital for further evaluation.  She physical examination blood pressure was 103/35, heart rate 77, respiratory rate 20, temperature 98.4, oxygen saturation 96%, her lungs were showing rhonchi bilaterally, heart S1-S2 present and rhythmic, abdomen was soft, no lower extremity edema.  Sodium 132, potassium 3.6, chloride 83, bicarb 31, glucose 122, BUN 22, creatinine 1.0, white count 3.8, hemoglobin 6.1, hematocrit 19.0, platelets 65.  Urinalysis negative for infection.  Her chest x-ray had bilateral nodular infiltrates.  SARS COVID-19 negative.   Patient was admitted to hospital working diagnosis of acute hypoxic respiratory failure due to multifocal pneumonia and was started on IV Rocephin and Zithromax.  I assumed patient's care on 01/24/2019.  Based on my examination and clinical judgment, patient seem to have acute on chronic systolic congestive heart failure so I started the patient on Lasix 40 mg IV twice daily.  Patient has had some diuresis and feels better today.  Consultants:   None  Procedures:   None  Antimicrobials:   IV Rocephin and Zithromax started on 01/22/2019   Subjective: Patient seen and examined.  She feels a little better today.  No other complaint.  Shortness of breath is improving.  Objective: Vitals:   01/25/19 0752 01/25/19 0753 01/25/19 1041 01/25/19 1352  BP:   (!) 122/51   Pulse:   72   Resp:      Temp:       TempSrc:      SpO2: 95% 95%  95%  Weight:      Height:        Intake/Output Summary (Last 24 hours) at 01/25/2019 1404 Last data filed at 01/25/2019 1256 Gross per 24 hour  Intake 342.98 ml  Output 1650 ml  Net -1307.02 ml   Filed Weights   01/22/19 1242  Weight: 86.6 kg    Examination:  General exam: Appears calm and comfortable on 2 L of nasal cannula oxygen Respiratory system: Bilateral crackles in all lobes along with some scant rhonchi bilaterally, overall improved lung exam compared to yesterday. Cardiovascular system: S1 & S2 heard, RRR. No JVD, murmurs, rubs, gallops or clicks.  +2 pitting edema bilateral lower extremity Gastrointestinal system: Abdomen is nondistended, soft and nontender. No organomegaly or masses felt. Normal bowel sounds heard. Central nervous system: Alert and oriented. No focal neurological deficits. Extremities: Symmetric 5 x 5 power. Skin: No rashes, lesions or ulcers Psychiatry: Judgement and insight appear normal. Mood & affect appropriate.    Data Reviewed: I have personally reviewed following labs and imaging studies  CBC: Recent Labs  Lab 01/22/19 1247 01/22/19 2236 01/23/19 0131 01/23/19 1205 01/24/19 1219 01/25/19 0516  WBC 3.8*  --  3.7*  --  4.2 3.9*  NEUTROABS 2.3  --   --   --  2.6 2.4  HGB 6.1* 7.0* 6.8* 8.0* 7.6* 7.0*  HCT 19.0* 22.4* 21.9* 25.1* 25.4* 22.7*  MCV 106.7*  --  105.8*  --  108.1* 107.6*  PLT 65*  --  65*  --  68* 61*   Basic Metabolic Panel: Recent Labs  Lab 01/22/19 1247 01/23/19 0131 01/24/19 1219 01/25/19 0516  NA 132* 137 139 138  K 3.6 4.1 4.5 4.3  CL 93* 97* 99 94*  CO2 31 31 34* 35*  GLUCOSE 122* 125* 128* 121*  BUN 22 19 13 13   CREATININE 1.03* 0.93 0.84 0.79  CALCIUM 8.5* 8.3* 8.7* 8.3*  MG  --   --  1.7 1.6*   GFR: Estimated Creatinine Clearance: 60.8 mL/min (by C-G formula based on SCr of 0.79 mg/dL). Liver Function Tests: Recent Labs  Lab 01/22/19 1247 01/24/19 1219 01/25/19  0516  AST 11* 10* 9*  ALT 21 16 14   ALKPHOS 55 48 40  BILITOT 2.0* 1.0 1.3*  PROT 6.1* 5.8* 5.5*  ALBUMIN 3.1* 2.9* 2.7*   No results for input(s): LIPASE, AMYLASE in the last 168 hours. No results for input(s): AMMONIA in the last 168 hours. Coagulation Profile: No results for input(s): INR, PROTIME in the last 168 hours. Cardiac Enzymes: Recent Labs  Lab 01/22/19 1258  TROPONINI <0.03   BNP (last 3 results) No results for input(s): PROBNP in the last 8760 hours. HbA1C: No results for input(s): HGBA1C in the last 72 hours. CBG: No results for input(s): GLUCAP in the last 168 hours. Lipid Profile: No results for input(s): CHOL, HDL, LDLCALC, TRIG, CHOLHDL, LDLDIRECT in the last 72 hours. Thyroid Function Tests: No results for input(s): TSH, T4TOTAL, FREET4, T3FREE, THYROIDAB in the last 72 hours. Anemia Panel: No results for input(s): VITAMINB12, FOLATE, FERRITIN, TIBC, IRON, RETICCTPCT in the last 72 hours. Sepsis Labs: Recent Labs  Lab 01/22/19 1246 01/22/19 2236 01/24/19 1219  PROCALCITON  --   --  <0.10  LATICACIDVEN 1.4 0.9  --     Recent Results (from the past 240 hour(s))  Culture, blood (routine x 2)     Status: None (Preliminary result)   Collection Time: 01/22/19 12:51 PM  Result Value Ref Range Status   Specimen Description   Final    BLOOD RIGHT ANTECUBITAL Performed at [facility], [place] [address], [place], [place] [address]    Special Requests   Final    BOTTLES DRAWN AEROBIC AND ANAEROBIC Blood Culture results may not be optimal due to an excessive volume of blood received in culture bottles Performed at [address], [place], [place] [address]    Culture   Final    NO GROWTH 3 DAYS Performed at [facility], [place] [address], [place], [place] [address]    Report Status PENDING  Incomplete  Culture, blood (routine x 2)     Status: None (Preliminary result)   Collection Time: 01/22/19 12:56 PM   Result Value Ref Range Status   Specimen Description   Final    BLOOD RIGHT ANTECUBITAL Performed at [facility], [place] [address] [place], [place] [address]    Special Requests   Final    BOTTLES DRAWN AEROBIC AND ANAEROBIC Blood Culture adequate volume Performed at [place] [address], [place], [place] [address]    Culture   Final    NO GROWTH 3 DAYS Performed at [facility], [place] [address], [place], [place] [address]    Report Status PENDING  Incomplete  SARS Coronavirus 2 (CEPHEID- Performed in [place] hospital lab), Hosp Order     Status: None   Collection Time: 01/22/19 12:59 PM  Result Value Ref Range Status   SARS Coronavirus 2 NEGATIVE NEGATIVE Final    Comment: (NOTE) If result is NEGATIVE SARS-CoV-2 target nucleic acids are NOT DETECTED. The SARS-CoV-2 RNA is generally detectable in upper and lower  respiratory specimens during the acute phase of infection. The lowest  concentration of SARS-CoV-2 viral copies this assay can detect is 250  copies / mL. A negative result does not preclude SARS-CoV-2 infection  and should not be used as the sole basis for treatment or other  patient management decisions.  A negative result may occur with  improper specimen collection / handling, submission of specimen other  than nasopharyngeal swab, presence of viral mutation(s) within the  areas targeted by this assay, and inadequate number of viral copies  (<250 copies / mL). A negative result must be combined with clinical  observations, patient history, and epidemiological information. If result is POSITIVE SARS-CoV-2 target nucleic acids are DETECTED. The SARS-CoV-2 RNA is generally detectable in upper and lower  respiratory specimens dur ing the acute phase of infection.  Positive  results are indicative of active infection with SARS-CoV-2.  Clinical  correlation with patient history and other diagnostic information is  necessary to  determine patient infection status.  Positive results do  not rule out bacterial infection or co-infection with other viruses. If result is PRESUMPTIVE POSTIVE SARS-CoV-2 nucleic acids MAY BE PRESENT.   A presumptive positive result was obtained on the submitted specimen  and confirmed on repeat testing.  While 2019 novel coronavirus  (SARS-CoV-2) nucleic acids may be present in the submitted sample  additional confirmatory testing may be necessary for epidemiological  and / or clinical management purposes  to differentiate between  SARS-CoV-2 and other Sarbecovirus currently known to infect humans.  If clinically indicated additional testing with an alternate test  methodology [phone]) is advised. The SARS-CoV-2 RNA is generally  detectable in upper and lower respiratory sp ecimens during the acute  phase of infection. The expected result is Negative. Fact Sheet for Patients:  StrictlyIdeas.no Fact Sheet for Healthcare Providers: BankingDealers.co.za This test is not yet approved or cleared by the [place] FDA and has been authorized for detection and/or diagnosis of SARS-CoV-2 by FDA under an Emergency Use Authorization (EUA).  This EUA will remain in effect (meaning this test can be used) for the duration of the COVID-19 declaration under Section 564(b)(1) of the Act, 21 U.S.C. section 360bbb-3(b)(1), unless the authorization is terminated or revoked sooner. Performed at [facility], [place] [address], [place], [place] [address]       Radiology Studies: Ct Angio Chest Pe W Or Wo Contrast  Result Date: 01/23/2019 CLINICAL DATA:  Acute hypoxic respiratory failure. EXAM: CT ANGIOGRAPHY CHEST WITH CONTRAST TECHNIQUE: Multidetector CT imaging of the chest was performed using the standard protocol during bolus administration of intravenous contrast. Multiplanar CT image reconstructions and MIPs were obtained to  evaluate the vascular anatomy. CONTRAST:  169mL OMNIPAQUE IOHEXOL 350 MG/ML SOLN COMPARISON:  Chest x-ray from yesterday. FINDINGS: Cardiovascular: Satisfactory opacification of the pulmonary arteries to the segmental level. No evidence of pulmonary embolism. Enlarged main pulmonary artery measuring up to 4.0 cm. Moderate cardiomegaly. No pericardial effusion. No thoracic aortic aneurysm or dissection. Mild atherosclerotic calcification of the aortic arch. Left chest wall pacemaker. Mediastinum/Nodes: Mildly enlarged AP window lymph node measuring 1.5 cm in short axis. No additional enlarged mediastinal lymph nodes. No enlarged hilar or axillary lymph nodes. The thyroid gland, trachea, and esophagus demonstrate no significant  findings. Lungs/Pleura: Moderate diffuse patchy peribronchovascular ground-glass densities and consolidation throughout both lungs. No pneumothorax. Upper Abdomen: No acute abnormality.  Cholelithiasis. Musculoskeletal: No chest wall abnormality. No acute or significant osseous findings. Review of the MIP images confirms the above findings. IMPRESSION: 1. Moderate diffuse patchy peribronchovascular ground-glass densities and consolidation throughout both lungs could reflect pulmonary edema or multifocal pneumonia. 2.  No evidence of pulmonary embolism. 3. Trace bilateral pleural effusions. 4. Moderately enlarged main pulmonary artery, suggestive of pulmonary arterial hypertension. 5. Mildly enlarged AP window lymph node, likely reactive. 6.  Aortic atherosclerosis (ICD10-I70.0). 7. Cholelithiasis. Electronically Signed   By: Titus Dubin M.D.   On: 01/23/2019 15:42    Scheduled Meds: . sodium chloride   Intravenous Once  . azithromycin  500 mg Oral Q24H  . carvedilol  25 mg Oral BID WC  . enoxaparin (LOVENOX) injection  40 mg Subcutaneous Q24H  . folic acid  1 mg Oral Daily  . furosemide  40 mg Intravenous BID  . ipratropium-albuterol  3 mL Nebulization TID  . levothyroxine  150  mcg Oral Q0600  . mouth rinse  15 mL Mouth Rinse BID  . pantoprazole  40 mg Oral Daily  . sodium chloride flush  3 mL Intravenous Once  . vitamin B-12  50 mcg Oral Daily   Continuous Infusions: . sodium chloride Stopped (01/22/19 1935)  . cefTRIAXone (ROCEPHIN)  IV 1 g (01/24/19 1636)     LOS: 3 days   Assessment & Plan:   Principal Problem:   Acute respiratory failure with hypoxia (HCC) Active Problems:   Hypothyroidism   Essential hypertension   Symptomatic anemia   MDS (myelodysplastic syndrome) (HCC)   Multifocal pneumonia   Respiratory failure (HCC)  Acute hypoxic respiratory failure secondary to multifocal pneumonia versus acute on chronic systolic congestive heart failure: Patient has been tested negative for COVID.  She remains afebrile with no leukocytosis.  She does not have any fever or leukocytosis.  Unremarkable procalcitonin.  Feels better since she was started on Lasix yesterday so I will continue Lasix 40 mg twice daily and at the same time I will continue empiric antibiotics for possible community-acquired pneumonia.  Currently on 2 L of oxygen.  Encourage incentive spirometry.  Try to wean oxygen.  Continue PRN breathing treatments.  #2 acute on chronic anemia secondary to MDS hemoglobin 6.1 at the time of admission, she has received 2 units of PRBC transfusion.  Hemoglobin improved and then dropped to 7.0 today.  Per oncology recommendations, I will transfuse her 1 unit of PRBC today.  #3 hypertension: Her blood pressure has been normal and her Cozaar has been on hold.  Continue carvedilol and monitor closely.  #4 hypothyroidism continue Synthroid.  DVT prophylaxis: Lovenox Code Status: Full code Family Communication: Plan discussed with the patient.  No family present. Disposition Plan: To be determined.  Potential discharge in next 24 to 48 hours.   Time spent: 30 minutes   Darliss Cheney, MD [facility] Pager [phone]  If 7PM-7AM, please  contact night-coverage [email or web address] Password [facility] 01/25/2019, 2:04 PM

## 2019-01-25 NOTE — Progress Notes (Signed)
Central Tele notified this RN of 5 beats of V. Tach. Patient resting comfortable. Vitals obtained. Dr. Doristine Bosworth notified via [place].

## 2019-01-25 NOTE — Progress Notes (Signed)
Physical Therapy Treatment Patient Details Name: [name] MRN: [id number] DOB: Apr 19, 1946 Today's Date: 01/25/2019    History of Present Illness 73 yo female admitted to ED on 5/11 with ShOB, hypoxia, with PNA sent from MD. 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 PMH includes pacemaker, GERD, EF 25-30% 2010, MVR, cardiomyopathy, depression, CHF.     PT Comments    Pt with increased O2 demands with ambulation this session, requiring up to 4LO2 to maintain O2sats >90%. Pt with dyspnea 2/4 during ambulation, but pt with lack of awareness of when to take rest breaks to recover desaturation. PT instructed pt in rest breaks and breathing techniques to recover sats. Pt limited by O2 desats and general deconditioning. PT to continue to follow acutely.    Follow Up Recommendations  Home health PT;Supervision for mobility/OOB(pt politely declines HHPT f/u)     Equipment Recommendations  None recommended by PT    Recommendations for Other Services       Precautions / Restrictions Precautions Precautions: Fall Precaution Comments: watch sats  Restrictions Weight Bearing Restrictions: No    Mobility  Bed Mobility Overal bed mobility: Needs Assistance Bed Mobility: Supine to Sit;Sit to Supine     Supine to sit: Supervision;HOB elevated Sit to supine: Supervision;HOB elevated   General bed mobility comments: Supervision for safety, management of O2/IV lines. Pt requiring seated rest after bed mobility, sats down to 83% on 2LO2. Pt placed on 3LO2.   Transfers Overall transfer level: Needs assistance Equipment used: Rolling walker (2 wheeled) Transfers: Sit to/from Stand Sit to Stand: Min guard         General transfer comment: Min guard for safety. Pt with improper hand placement (both hands on RW) with rising. Pt with continued desats to mid-80s, pt placed on 4LO2 for ambulation.   Ambulation/Gait Ambulation/Gait assistance: Min guard Gait Distance (Feet): 50 Feet Assistive device:  Rolling walker (2 wheeled) Gait Pattern/deviations: Step-through pattern;Decreased stride length Gait velocity: decr    General Gait Details: Min guard to supervision for safety, pt on 4LO2 with sats ranging from 85-95%, pt taking 2 standing rest breaks to recover. Pt limited in distance due to dsypnea 2/4.   Stairs             Wheelchair Mobility    Modified Rankin (Stroke Patients Only)       Balance Overall balance assessment: Needs assistance Sitting-balance support: No upper extremity supported;Feet supported Sitting balance-Leahy Scale: Good     Standing balance support: Bilateral upper extremity supported Standing balance-Leahy Scale: Poor                              Cognition Arousal/Alertness: Awake/alert Behavior During Therapy: WFL for tasks assessed/performed Overall Cognitive Status: Within Functional Limits for tasks assessed                                        Exercises      General Comments        Pertinent Vitals/Pain Pain Assessment: No/denies pain    Home Living                      Prior Function            PT Goals (current goals can now be found in the care plan section) Acute Rehab PT Goals Patient  Stated Goal: go home, breathe better PT Goal Formulation: With patient Time For Goal Achievement: 02/06/19 Potential to Achieve Goals: Good Progress towards PT goals: Progressing toward goals    Frequency    Min 3X/week      PT Plan Current plan remains appropriate;Other (comment)(O2 requirements)    Co-evaluation              AM-PAC PT "6 Clicks" Mobility   Outcome Measure  Help needed turning from your back to your side while in a flat bed without using bedrails?: A Little Help needed moving from lying on your back to sitting on the side of a flat bed without using bedrails?: A Little Help needed moving to and from a bed to a chair (including a wheelchair)?: A Little Help  needed standing up from a chair using your arms (e.g., wheelchair or bedside chair)?: A Little Help needed to walk in hospital room?: A Little Help needed climbing 3-5 steps with a railing? : A Little 6 Click Score: 18    End of Session Equipment Utilized During Treatment: Oxygen;Gait belt Activity Tolerance: Treatment limited secondary to medical complications (Comment)(desat) Patient left: in bed;with call bell/phone within reach;with bed alarm set Nurse Communication: Mobility status PT Visit Diagnosis: Other abnormalities of gait and mobility (R26.89);Muscle weakness (generalized) (M62.81);Difficulty in walking, not elsewhere classified (R26.2)     Time: 8421-0312 PT Time Calculation (min) (ACUTE ONLY): 24 min  Charges:  $Gait Training: 23-37 mins                    [name], PT Acute Rehabilitation Services Pager [phone]  Office [phone]  [name] 01/25/2019, 5:41 PM

## 2019-01-26 ENCOUNTER — Inpatient Hospital Stay (HOSPITAL_COMMUNITY): Payer: Medicare Other

## 2019-01-26 LAB — CBC WITH DIFFERENTIAL/PLATELET
Abs Immature Granulocytes: 0.08 10*3/uL — ABNORMAL HIGH (ref 0.00–0.07)
Basophils Absolute: 0 10*3/uL (ref 0.0–0.1)
Basophils Relative: 1 %
Eosinophils Absolute: 0 10*3/uL (ref 0.0–0.5)
Eosinophils Relative: 0 %
HCT: 25.8 % — ABNORMAL LOW (ref 36.0–46.0)
Hemoglobin: 8 g/dL — ABNORMAL LOW (ref 12.0–15.0)
Immature Granulocytes: 2 %
Lymphocytes Relative: 18 %
Lymphs Abs: 0.8 10*3/uL (ref 0.7–4.0)
MCH: 32.1 pg (ref 26.0–34.0)
MCHC: 31 g/dL (ref 30.0–36.0)
MCV: 103.6 fL — ABNORMAL HIGH (ref 80.0–100.0)
Monocytes Absolute: 0.6 10*3/uL (ref 0.1–1.0)
Monocytes Relative: 13 %
Neutro Abs: 2.9 10*3/uL (ref 1.7–7.7)
Neutrophils Relative %: 66 %
Platelets: 60 10*3/uL — ABNORMAL LOW (ref 150–400)
RBC: 2.49 MIL/uL — ABNORMAL LOW (ref 3.87–5.11)
RDW: 21 % — ABNORMAL HIGH (ref 11.5–15.5)
WBC: 4.3 10*3/uL (ref 4.0–10.5)
nRBC: 3.9 % — ABNORMAL HIGH (ref 0.0–0.2)

## 2019-01-26 LAB — COMPREHENSIVE METABOLIC PANEL
ALT: 13 U/L (ref 0–44)
AST: 9 U/L — ABNORMAL LOW (ref 15–41)
Albumin: 2.9 g/dL — ABNORMAL LOW (ref 3.5–5.0)
Alkaline Phosphatase: 42 U/L (ref 38–126)
Anion gap: 10 (ref 5–15)
BUN: 13 mg/dL (ref 8–23)
CO2: 38 mmol/L — ABNORMAL HIGH (ref 22–32)
Calcium: 8.1 mg/dL — ABNORMAL LOW (ref 8.9–10.3)
Chloride: 88 mmol/L — ABNORMAL LOW (ref 98–111)
Creatinine, Ser: 0.8 mg/dL (ref 0.44–1.00)
GFR calc Af Amer: >60 mL/min (ref >60)
GFR calc non Af Amer: >60 mL/min (ref >60)
Glucose, Bld: 156 mg/dL — ABNORMAL HIGH (ref 70–99)
Potassium: 3.4 mmol/L — ABNORMAL LOW (ref 3.5–5.1)
Sodium: 136 mmol/L (ref 135–145)
Total Bilirubin: 1.8 mg/dL — ABNORMAL HIGH (ref 0.3–1.2)
Total Protein: 5.9 g/dL — ABNORMAL LOW (ref 6.5–8.1)

## 2019-01-26 LAB — MAGNESIUM: Magnesium: 1.5 mg/dL — ABNORMAL LOW (ref 1.7–2.4)

## 2019-01-26 MED ORDER — POLYETHYLENE GLYCOL 3350 17 G PO PACK: 17.0000 g | PACK | Freq: Every day | ORAL | Status: DC | PRN

## 2019-01-26 MED ORDER — POLYETHYLENE GLYCOL 3350 17 G PO PACK
17.0000 g | PACK | Freq: Every day | ORAL | Status: DC
  Administered 2019-01-26 – 2019-02-07 (×11): 17 g via ORAL
  Filled 2019-01-26 (×12): qty 1

## 2019-01-26 MED ORDER — FUROSEMIDE 10 MG/ML IJ SOLN
40.0000 mg | Freq: Once | INTRAMUSCULAR | Status: AC
  Administered 2019-01-26: 12:00:00 40 mg via INTRAVENOUS
  Filled 2019-01-26: qty 4

## 2019-01-26 MED ORDER — DOCUSATE SODIUM 100 MG PO CAPS
100.0000 mg | ORAL_CAPSULE | Freq: Two times a day (BID) | ORAL | Status: DC
  Administered 2019-01-26 – 2019-02-08 (×24): 100 mg via ORAL
  Filled 2019-01-26 (×24): qty 1

## 2019-01-26 NOTE — Progress Notes (Signed)
RT returned to reassess patient O2 sats; RN made aware of low O2 post-treatment and that RT would return to reassess. Patient was 92-93% on 4 L [place]. Bedside RN with patient at this time. RT will continue to monitor patient.

## 2019-01-26 NOTE — Progress Notes (Signed)
PROGRESS NOTE    [name]  OBS:[id number] DOB: 1946-03-21 DOA: 01/22/2019 PCP: Elby Showers, MD   Brief Narrative:  73 year old female who presented with dyspnea and weakness.  She does have significant past medical history for myelodysplastic syndrome and nonischemic cardiomyopathy systolic dysfunction ejection fraction 25 to 30%.  She was seen by her primary care provider on the day of admission, she was found hypoxic down to 80% on room air and her hemoglobin was down to 6.1, she was placed on supplemental oxygen and transferred to the hospital for further evaluation.  She physical examination blood pressure was 103/35, heart rate 77, respiratory rate 20, temperature 98.4, oxygen saturation 96%, her lungs were showing rhonchi bilaterally, heart S1-S2 present and rhythmic, abdomen was soft, no lower extremity edema.  Sodium 132, potassium 3.6, chloride 83, bicarb 31, glucose 122, BUN 22, creatinine 1.0, white count 3.8, hemoglobin 6.1, hematocrit 19.0, platelets 65.  Urinalysis negative for infection.  Her chest x-ray had bilateral nodular infiltrates.  SARS COVID-19 negative.   Patient was admitted to hospital working diagnosis of acute hypoxic respiratory failure due to multifocal pneumonia and was started on IV Rocephin and Zithromax.  I assumed patient's care on 01/24/2019.  Based on my examination and clinical judgment, patient seem to have acute on chronic systolic congestive heart failure so I started the patient on Lasix 40 mg IV twice daily.  She has no leukocytosis, she is afebrile with unremarkable procalcitonin.  Consultants:   None  Procedures:   None  Antimicrobials:   IV Rocephin and Zithromax started on 01/22/2019   Subjective: Patient seen and examined.  She continues to complain of shortness of breath with no improvement compared to yesterday.  Requiring more oxygen today, at 4 L and saturating around 90%.  Objective: Vitals:   01/25/19 1625 01/25/19 2013  01/26/19 0535 01/26/19 0754  BP: (!) 116/59 (!) 123/56 (!) 136/59   Pulse: 79 77 82 84  Resp: 18 20 14 18   Temp: 98.3 F (36.8 C) 98.3 F (36.8 C) 98.7 F (37.1 C)   TempSrc: Oral Oral Oral   SpO2: 100% 97% 97% (S) 92%  Weight:      Height:        Intake/Output Summary (Last 24 hours) at 01/26/2019 1151 Last data filed at 01/26/2019 1044 Gross per 24 hour  Intake 542 ml  Output 2550 ml  Net -2008 ml   Filed Weights   01/22/19 1242  Weight: 86.6 kg    Examination:  General exam: Appears calm and comfortable on 2 L of nasal cannula oxygen Respiratory system: Bilateral crackles in all lobes along with some scant rhonchi bilaterally, slightly improved lung exam compared to yesterday,  Cardiovascular system: S1 & S2 heard, RRR. No JVD, murmurs, rubs, gallops or clicks.  +2 pitting edema bilateral lower extremity Gastrointestinal system: Abdomen is nondistended, soft and nontender. No organomegaly or masses felt. Normal bowel sounds heard. Central nervous system: Alert and oriented. No focal neurological deficits. Extremities: Symmetric 5 x 5 power. Skin: No rashes, lesions or ulcers Psychiatry: Judgement and insight appear normal. Mood & affect appropriate.    Data Reviewed: I have personally reviewed following labs and imaging studies  CBC: Recent Labs  Lab 01/22/19 1247  01/23/19 0131 01/23/19 1205 01/24/19 1219 01/25/19 0516 01/26/19 0516  WBC 3.8*  --  3.7*  --  4.2 3.9* 4.3  NEUTROABS 2.3  --   --   --  2.6 2.4 2.9  HGB 6.1*   < >  6.8* 8.0* 7.6* 7.0* 8.0*  HCT 19.0*   < > 21.9* 25.1* 25.4* 22.7* 25.8*  MCV 106.7*  --  105.8*  --  108.1* 107.6* 103.6*  PLT 65*  --  65*  --  68* 61* 60*   < > = values in this interval not displayed.   Basic Metabolic Panel: Recent Labs  Lab 01/22/19 1247 01/23/19 0131 01/24/19 1219 01/25/19 0516 01/26/19 1006  NA 132* 137 139 138 136  K 3.6 4.1 4.5 4.3 3.4*  CL 93* 97* 99 94* 88*  CO2 31 31 34* 35* 38*  GLUCOSE 122*  125* 128* 121* 156*  BUN 22 19 13 13 13   CREATININE 1.03* 0.93 0.84 0.79 0.80  CALCIUM 8.5* 8.3* 8.7* 8.3* 8.1*  MG  --   --  1.7 1.6* 1.5*   GFR: Estimated Creatinine Clearance: 60.8 mL/min (by C-G formula based on SCr of 0.8 mg/dL). Liver Function Tests: Recent Labs  Lab 01/22/19 1247 01/24/19 1219 01/25/19 0516 01/26/19 1006  AST 11* 10* 9* 9*  ALT 21 16 14 13   ALKPHOS 55 48 40 42  BILITOT 2.0* 1.0 1.3* 1.8*  PROT 6.1* 5.8* 5.5* 5.9*  ALBUMIN 3.1* 2.9* 2.7* 2.9*   No results for input(s): LIPASE, AMYLASE in the last 168 hours. No results for input(s): AMMONIA in the last 168 hours. Coagulation Profile: No results for input(s): INR, PROTIME in the last 168 hours. Cardiac Enzymes: Recent Labs  Lab 01/22/19 1258  TROPONINI <0.03   BNP (last 3 results) No results for input(s): PROBNP in the last 8760 hours. HbA1C: No results for input(s): HGBA1C in the last 72 hours. CBG: No results for input(s): GLUCAP in the last 168 hours. Lipid Profile: No results for input(s): CHOL, HDL, LDLCALC, TRIG, CHOLHDL, LDLDIRECT in the last 72 hours. Thyroid Function Tests: No results for input(s): TSH, T4TOTAL, FREET4, T3FREE, THYROIDAB in the last 72 hours. Anemia Panel: No results for input(s): VITAMINB12, FOLATE, FERRITIN, TIBC, IRON, RETICCTPCT in the last 72 hours. Sepsis Labs: Recent Labs  Lab 01/22/19 1246 01/22/19 2236 01/24/19 1219  PROCALCITON  --   --  <0.10  LATICACIDVEN 1.4 0.9  --     Recent Results (from the past 240 hour(s))  Culture, blood (routine x 2)     Status: None (Preliminary result)   Collection Time: 01/22/19 12:51 PM  Result Value Ref Range Status   Specimen Description   Final    BLOOD RIGHT ANTECUBITAL Performed at [facility], [place] [address] [place], [place] [address]    Special Requests   Final    BOTTLES DRAWN AEROBIC AND ANAEROBIC Blood Culture results may not be optimal due to an excessive volume of blood received in culture bottles  Performed at [facility] [address], [place], [place] [address]    Culture   Final    NO GROWTH 4 DAYS Performed at [facility], [place] [address] [place], [place] [address]    Report Status PENDING  Incomplete  Culture, blood (routine x 2)     Status: None (Preliminary result)   Collection Time: 01/22/19 12:56 PM  Result Value Ref Range Status   Specimen Description   Final    BLOOD RIGHT ANTECUBITAL Performed at [facility], [place] [address] [place], [place] [address]    Special Requests   Final    BOTTLES DRAWN AEROBIC AND ANAEROBIC Blood Culture adequate volume Performed at [facility]  [address], [place], [place] [address]    Culture   Final    NO GROWTH 4 DAYS Performed at [facility], [place] [address], [place], [place] [address]    Report Status PENDING  Incomplete  SARS Coronavirus 2 (CEPHEID- Performed in [facility] hospital lab), Hosp Order     Status: None   Collection Time: 01/22/19 12:59 PM  Result Value Ref Range Status   SARS Coronavirus 2 NEGATIVE NEGATIVE Final    Comment: (NOTE) If result is NEGATIVE SARS-CoV-2 target nucleic acids are NOT DETECTED. The SARS-CoV-2 RNA is generally detectable in upper and lower  respiratory specimens during the acute phase of infection. The lowest  concentration of SARS-CoV-2 viral copies this assay can detect is 250  copies / mL. A negative result does not preclude SARS-CoV-2 infection  and should not be used as the sole basis for treatment or other  patient management decisions.  A negative result may occur with  improper specimen collection / handling, submission of specimen other  than nasopharyngeal swab, presence of viral mutation(s) within the  areas targeted by this assay, and inadequate number of viral copies  (<250 copies / mL). A negative result must be combined with clinical  observations, patient history, and epidemiological information. If  result is POSITIVE SARS-CoV-2 target nucleic acids are DETECTED. The SARS-CoV-2 RNA is generally detectable in upper and lower  respiratory specimens dur ing the acute phase of infection.  Positive  results are indicative of active infection with SARS-CoV-2.  Clinical  correlation with patient history and other diagnostic information is  necessary to determine patient infection status.  Positive results do  not rule out bacterial infection or co-infection with other viruses. If result is PRESUMPTIVE POSTIVE SARS-CoV-2 nucleic acids MAY BE PRESENT.   A presumptive positive result was obtained on the submitted specimen  and confirmed on repeat testing.  While 2019 novel coronavirus  (SARS-CoV-2) nucleic acids may be present in the submitted sample  additional confirmatory testing may be necessary for epidemiological  and / or clinical management purposes  to differentiate between  SARS-CoV-2 and other Sarbecovirus currently known to infect humans.  If clinically indicated additional testing with an alternate test  methodology [phone]) is advised. The SARS-CoV-2 RNA is generally  detectable in upper and lower respiratory sp ecimens during the acute  phase of infection. The expected result is Negative. Fact Sheet for Patients:  StrictlyIdeas.no Fact Sheet for Healthcare Providers: BankingDealers.co.za This test is not yet approved or cleared by the [place] FDA and has been authorized for detection and/or diagnosis of SARS-CoV-2 by FDA under an Emergency Use Authorization (EUA).  This EUA will remain in effect (meaning this test can be used) for the duration of the COVID-19 declaration under Section 564(b)(1) of the Act, 21 U.S.C. section 360bbb-3(b)(1), unless the authorization is terminated or revoked sooner. Performed at [facility], [place] [address], [place],  [address]   Respiratory Panel by PCR      Status: None   Collection Time: 01/25/19  1:12 PM  Result Value Ref Range Status   Adenovirus NOT DETECTED NOT DETECTED Final   Coronavirus 229E NOT DETECTED NOT DETECTED Final    Comment: (NOTE) The Coronavirus on the Respiratory Panel, DOES NOT test for the novel  Coronavirus (2019 nCoV)    Coronavirus HKU1 NOT DETECTED NOT DETECTED Final   Coronavirus NL63 NOT DETECTED NOT DETECTED Final   Coronavirus OC43 NOT DETECTED NOT DETECTED Final   Metapneumovirus  NOT DETECTED NOT DETECTED Final   Rhinovirus / Enterovirus NOT DETECTED NOT DETECTED Final   Influenza A NOT DETECTED NOT DETECTED Final   Influenza B NOT DETECTED NOT DETECTED Final   Parainfluenza Virus 1 NOT DETECTED NOT DETECTED Final   Parainfluenza Virus 2 NOT DETECTED NOT DETECTED Final   Parainfluenza Virus 3 NOT DETECTED NOT DETECTED Final   Parainfluenza Virus 4 NOT DETECTED NOT DETECTED Final   Respiratory Syncytial Virus NOT DETECTED NOT DETECTED Final   Bordetella pertussis NOT DETECTED NOT DETECTED Final   Chlamydophila pneumoniae NOT DETECTED NOT DETECTED Final   Mycoplasma pneumoniae NOT DETECTED NOT DETECTED Final    Comment: Performed at [facility], [place] [address] [place], [place] [address]      Radiology Studies: Dg Chest Port 1 View  Result Date: 01/26/2019 CLINICAL DATA:  Dyspnea. EXAM: PORTABLE CHEST 1 VIEW COMPARISON:  Chest radiographs 01/22/2019 and CT 01/23/2019 FINDINGS: An ICD remains in place, and the cardiac silhouette remains enlarged. Central pulmonary vascular congestion and peribronchial cuffing are again seen. Patchy bilateral airspace opacities have mildly worsened from the prior radiographs, most notably in the lung bases. No large pleural effusion or pneumothorax is identified. IMPRESSION: Mild worsening of bilateral airspace opacities which may reflect edema or pneumonia. Electronically Signed   By: Logan Bores M.D.   On: 01/26/2019 11:15    Scheduled Meds: . azithromycin  500 mg  Oral Q24H  . carvedilol  25 mg Oral BID WC  . docusate sodium  100 mg Oral BID  . enoxaparin (LOVENOX) injection  40 mg Subcutaneous Q24H  . folic acid  1 mg Oral Daily  . furosemide  40 mg Intravenous BID  . furosemide  40 mg Intravenous Once  . ipratropium-albuterol  3 mL Nebulization TID  . levothyroxine  150 mcg Oral Q0600  . mouth rinse  15 mL Mouth Rinse BID  . pantoprazole  40 mg Oral Daily  . polyethylene glycol  17 g Oral Daily  . sodium chloride flush  3 mL Intravenous Once  . vitamin B-12  50 mcg Oral Daily   Continuous Infusions: . sodium chloride Stopped (01/22/19 1935)  . cefTRIAXone (ROCEPHIN)  IV 1 g (01/25/19 1510)     LOS: 4 days   Assessment & Plan:   Principal Problem:   Acute respiratory failure with hypoxia (HCC) Active Problems:   Hypothyroidism   Essential hypertension   Symptomatic anemia   MDS (myelodysplastic syndrome) (HCC)   Multifocal pneumonia   Respiratory failure (HCC)  Acute hypoxic respiratory failure secondary to multifocal pneumonia versus acute on chronic systolic congestive heart failure: Patient has been tested negative for COVID.  She remains afebrile with no leukocytosis. Unremarkable procalcitonin.  Felt better yesterday but no improvement today.  Repeated chest x-ray which shows worsening congestive heart failure.  She remains on Lasix 40 mg IV twice daily, received morning dose already.  We will give her extra dose of 40 IV Lasix today.  Could very well be due to her receiving blood transfusion yesterday.  Try to wean oxygen.  Currently she is on 4 L of nasal cannula.  Continue PRN breathing treatments.  #2 acute on chronic anemia secondary to MDS hemoglobin 6.1 at the time of admission, she has received 2 units of PRBC transfusion.  Hemoglobin improved and then dropped to 7.0 on 01/25/2019 and she received another unit of transfusion Per oncology recommendations.  #3 hypertension: Blood pressure controlled.  Continue carvedilol and  continue to  hold Cozaar.  #4 hypothyroidism continue Synthroid.  DVT prophylaxis: Lovenox Code Status: Full code Family Communication: Plan discussed with the patient.  No family present. Disposition Plan: To be determined pending clinical improvement.   Time spent: 30 minutes   Darliss Cheney, MD [facility] Pager [phone]  If 7PM-7AM, please contact night-coverage [email or web address] Password [email or web address] 01/26/2019, 11:51 AM

## 2019-01-26 NOTE — Progress Notes (Signed)
RT came to administer morning breathing treatment. Upon arrival, patient O2 sats 92% on 4 L [place]; patient requesting to go to bathroom at this time. When patient returned to the bed, O2 sats 69-72% on 4 L; patient confirms ShOB feeling post-ambulation. Patient administered breathing treatment and O2 returned to 96-97%% on 7 L via neb. Patient to be placed back on 4 L [place] and O2 reassessed at rest. RT will continue to monitor patient.

## 2019-01-26 NOTE — Progress Notes (Signed)
Pt selected [place] for [facility] needs. Referral given to in house rep.

## 2019-01-27 ENCOUNTER — Inpatient Hospital Stay (HOSPITAL_COMMUNITY): Payer: Medicare Other

## 2019-01-27 LAB — COMPREHENSIVE METABOLIC PANEL
ALT: 12 U/L (ref 0–44)
AST: 9 U/L — ABNORMAL LOW (ref 15–41)
Albumin: 2.8 g/dL — ABNORMAL LOW (ref 3.5–5.0)
Alkaline Phosphatase: 42 U/L (ref 38–126)
Anion gap: 8 (ref 5–15)
BUN: 14 mg/dL (ref 8–23)
CO2: 45 mmol/L — ABNORMAL HIGH (ref 22–32)
Calcium: 8.1 mg/dL — ABNORMAL LOW (ref 8.9–10.3)
Chloride: 85 mmol/L — ABNORMAL LOW (ref 98–111)
Creatinine, Ser: 0.74 mg/dL (ref 0.44–1.00)
GFR calc Af Amer: >60 mL/min (ref >60)
GFR calc non Af Amer: >60 mL/min (ref >60)
Glucose, Bld: 120 mg/dL — ABNORMAL HIGH (ref 70–99)
Potassium: 3.5 mmol/L (ref 3.5–5.1)
Sodium: 138 mmol/L (ref 135–145)
Total Bilirubin: 1.9 mg/dL — ABNORMAL HIGH (ref 0.3–1.2)
Total Protein: 5.8 g/dL — ABNORMAL LOW (ref 6.5–8.1)

## 2019-01-27 LAB — TYPE AND SCREEN
ABO/RH(D): O POS
Antibody Screen: NEGATIVE
Unit division: 0
Unit division: 0
Unit division: 0

## 2019-01-27 LAB — CULTURE, BLOOD (ROUTINE X 2)
Culture: NO GROWTH
Culture: NO GROWTH
Special Requests: ADEQUATE

## 2019-01-27 LAB — CBC WITH DIFFERENTIAL/PLATELET
Abs Immature Granulocytes: 0.09 10*3/uL — ABNORMAL HIGH (ref 0.00–0.07)
Basophils Absolute: 0 10*3/uL (ref 0.0–0.1)
Basophils Relative: 1 %
Eosinophils Absolute: 0 10*3/uL (ref 0.0–0.5)
Eosinophils Relative: 0 %
HCT: 24.7 % — ABNORMAL LOW (ref 36.0–46.0)
Hemoglobin: 7.7 g/dL — ABNORMAL LOW (ref 12.0–15.0)
Immature Granulocytes: 2 %
Lymphocytes Relative: 17 %
Lymphs Abs: 0.7 10*3/uL (ref 0.7–4.0)
MCH: 32 pg (ref 26.0–34.0)
MCHC: 31.2 g/dL (ref 30.0–36.0)
MCV: 102.5 fL — ABNORMAL HIGH (ref 80.0–100.0)
Monocytes Absolute: 0.6 10*3/uL (ref 0.1–1.0)
Monocytes Relative: 15 %
Neutro Abs: 2.5 10*3/uL (ref 1.7–7.7)
Neutrophils Relative %: 65 %
Platelets: 59 10*3/uL — ABNORMAL LOW (ref 150–400)
RBC: 2.41 MIL/uL — ABNORMAL LOW (ref 3.87–5.11)
RDW: 19.9 % — ABNORMAL HIGH (ref 11.5–15.5)
WBC: 3.8 10*3/uL — ABNORMAL LOW (ref 4.0–10.5)
nRBC: 5 % — ABNORMAL HIGH (ref 0.0–0.2)

## 2019-01-27 LAB — BPAM RBC
Blood Product Expiration Date: 202005292359
Blood Product Expiration Date: 202006032359
Blood Product Expiration Date: 202006102359
ISSUE DATE / TIME: 202005111533
ISSUE DATE / TIME: 202005120532
ISSUE DATE / TIME: 202005141629
Unit Type and Rh: 5100
Unit Type and Rh: 5100
Unit Type and Rh: 5100

## 2019-01-27 MED ORDER — IPRATROPIUM-ALBUTEROL 0.5-2.5 (3) MG/3ML IN SOLN
3.0000 mL | Freq: Two times a day (BID) | RESPIRATORY_TRACT | Status: DC
  Administered 2019-01-27 – 2019-02-07 (×22): 3 mL via RESPIRATORY_TRACT
  Filled 2019-01-27 (×21): qty 3

## 2019-01-27 MED ORDER — FUROSEMIDE 10 MG/ML IJ SOLN
40.0000 mg | Freq: Three times a day (TID) | INTRAMUSCULAR | Status: DC
  Administered 2019-01-27 – 2019-02-01 (×16): 40 mg via INTRAVENOUS
  Filled 2019-01-27 (×18): qty 4

## 2019-01-27 NOTE — Progress Notes (Signed)
PROGRESS NOTE    [name]  ONG:[id number] DOB: Sep 01, 1946 DOA: 01/22/2019 PCP: Elby Showers, MD   Brief Narrative:  73 year old female who presented with dyspnea and weakness.  She does have significant past medical history for myelodysplastic syndrome and nonischemic cardiomyopathy systolic dysfunction ejection fraction 25 to 30%.  She was seen by her primary care provider on the day of admission, she was found hypoxic down to 80% on room air and her hemoglobin was down to 6.1, she was placed on supplemental oxygen and transferred to the hospital for further evaluation.  She physical examination blood pressure was 103/35, heart rate 77, respiratory rate 20, temperature 98.4, oxygen saturation 96%, her lungs were showing rhonchi bilaterally, heart S1-S2 present and rhythmic, abdomen was soft, no lower extremity edema.  Sodium 132, potassium 3.6, chloride 83, bicarb 31, glucose 122, BUN 22, creatinine 1.0, white count 3.8, hemoglobin 6.1, hematocrit 19.0, platelets 65.  Urinalysis negative for infection.  Her chest x-ray had bilateral nodular infiltrates.  SARS COVID-19 negative.   Patient was admitted to hospital working diagnosis of acute hypoxic respiratory failure due to multifocal pneumonia and was started on IV Rocephin and Zithromax.  I assumed patient's care on 01/24/2019.  Based on my examination and clinical judgment, patient seem to have acute on chronic systolic congestive heart failure so I started the patient on Lasix 40 mg IV twice daily.  She has no leukocytosis, she is afebrile with unremarkable procalcitonin.  Consultants:   None  Procedures:   None  Antimicrobials:   IV Rocephin and Zithromax started on 01/22/2019   Subjective: Patient seen and examined.  She states that she still has shortness of breath and does not feel any different.  She is on 4 L of oxygen but she is comfortable and not dyspneic at all.  Objective: Vitals:   01/26/19 2100 01/26/19 2125  01/27/19 0519 01/27/19 0749  BP:  (!) 119/54 (!) 109/54   Pulse:  80 81   Resp:  (!) 21 17   Temp:  98.8 F (37.1 C) 98.5 F (36.9 C)   TempSrc:  Oral Oral   SpO2: 99% 93% 98% 96%  Weight:      Height:        Intake/Output Summary (Last 24 hours) at 01/27/2019 1248 Last data filed at 01/27/2019 0030 Gross per 24 hour  Intake -  Output 700 ml  Net -700 ml   Filed Weights   01/22/19 1242  Weight: 86.6 kg    Examination:  General exam: Appears calm and comfortable on 2 L of nasal cannula oxygen Respiratory system: Bilateral crackles in all lobes along with some scant rhonchi bilaterally with diminished breath sounds at the bases, no wheezing Cardiovascular system: S1 & S2 heard, RRR. No JVD, murmurs, rubs, gallops or clicks.  +2 pitting edema bilateral lower extremity Gastrointestinal system: Abdomen is nondistended, soft and nontender. No organomegaly or masses felt. Normal bowel sounds heard. Central nervous system: Alert and oriented. No focal neurological deficits. Extremities: Symmetric 5 x 5 power. Skin: No rashes, lesions or ulcers Psychiatry: Judgement and insight appear normal. Mood & affect appropriate.    Data Reviewed: I have personally reviewed following labs and imaging studies  CBC: Recent Labs  Lab 01/22/19 1247  01/23/19 0131 01/23/19 1205 01/24/19 1219 01/25/19 0516 01/26/19 0516 01/27/19 0537  WBC 3.8*  --  3.7*  --  4.2 3.9* 4.3 3.8*  NEUTROABS 2.3  --   --   --  2.6 2.4  2.9 2.5  HGB 6.1*   < > 6.8* 8.0* 7.6* 7.0* 8.0* 7.7*  HCT 19.0*   < > 21.9* 25.1* 25.4* 22.7* 25.8* 24.7*  MCV 106.7*  --  105.8*  --  108.1* 107.6* 103.6* 102.5*  PLT 65*  --  65*  --  68* 61* 60* 59*   < > = values in this interval not displayed.   Basic Metabolic Panel: Recent Labs  Lab 01/23/19 0131 01/24/19 1219 01/25/19 0516 01/26/19 1006 01/27/19 0537  NA 137 139 138 136 138  K 4.1 4.5 4.3 3.4* 3.5  CL 97* 99 94* 88* 85*  CO2 31 34* 35* 38* 45*  GLUCOSE 125*  128* 121* 156* 120*  BUN 19 13 13 13 14   CREATININE 0.93 0.84 0.79 0.80 0.74  CALCIUM 8.3* 8.7* 8.3* 8.1* 8.1*  MG  --  1.7 1.6* 1.5*  --    GFR: Estimated Creatinine Clearance: 60.8 mL/min (by C-G formula based on SCr of 0.74 mg/dL). Liver Function Tests: Recent Labs  Lab 01/22/19 1247 01/24/19 1219 01/25/19 0516 01/26/19 1006 01/27/19 0537  AST 11* 10* 9* 9* 9*  ALT 21 16 14 13 12   ALKPHOS 55 48 40 42 42  BILITOT 2.0* 1.0 1.3* 1.8* 1.9*  PROT 6.1* 5.8* 5.5* 5.9* 5.8*  ALBUMIN 3.1* 2.9* 2.7* 2.9* 2.8*   No results for input(s): LIPASE, AMYLASE in the last 168 hours. No results for input(s): AMMONIA in the last 168 hours. Coagulation Profile: No results for input(s): INR, PROTIME in the last 168 hours. Cardiac Enzymes: Recent Labs  Lab 01/22/19 1258  TROPONINI <0.03   BNP (last 3 results) No results for input(s): PROBNP in the last 8760 hours. HbA1C: No results for input(s): HGBA1C in the last 72 hours. CBG: No results for input(s): GLUCAP in the last 168 hours. Lipid Profile: No results for input(s): CHOL, HDL, LDLCALC, TRIG, CHOLHDL, LDLDIRECT in the last 72 hours. Thyroid Function Tests: No results for input(s): TSH, T4TOTAL, FREET4, T3FREE, THYROIDAB in the last 72 hours. Anemia Panel: No results for input(s): VITAMINB12, FOLATE, FERRITIN, TIBC, IRON, RETICCTPCT in the last 72 hours. Sepsis Labs: Recent Labs  Lab 01/22/19 1246 01/22/19 2236 01/24/19 1219  PROCALCITON  --   --  <0.10  LATICACIDVEN 1.4 0.9  --     Recent Results (from the past 240 hour(s))  Culture, blood (routine x 2)     Status: None   Collection Time: 01/22/19 12:51 PM  Result Value Ref Range Status   Specimen Description   Final    BLOOD RIGHT ANTECUBITAL Performed at [facility], [place] [address] [place], [place] [address]    Special Requests   Final    BOTTLES DRAWN AEROBIC AND ANAEROBIC Blood Culture results may not be optimal due to an excessive volume of blood received  in culture bottles Performed at [facility] [address] [place], [place] [address]    Culture   Final    NO GROWTH 5 DAYS Performed at [facility], [place] [address], [place], [place] [address]    Report Status 01/27/2019 FINAL  Final  Culture, blood (routine x 2)     Status: None   Collection Time: 01/22/19 12:56 PM  Result Value Ref Range Status   Specimen Description   Final    BLOOD RIGHT ANTECUBITAL Performed at [facility], [place] [address] [place], [place] [address]    Special Requests   Final  BOTTLES DRAWN AEROBIC AND ANAEROBIC Blood Culture adequate volume Performed at [facility] [address] [place], [place] [address]    Culture   Final    NO GROWTH 5 DAYS Performed at [facility], [place] [address] [place], [place] [address]    Report Status 01/27/2019 FINAL  Final  SARS Coronavirus 2 (CEPHEID- Performed in [facility] lab), Hosp Order     Status: None   Collection Time: 01/22/19 12:59 PM  Result Value Ref Range Status   SARS Coronavirus 2 NEGATIVE NEGATIVE Final    Comment: (NOTE) If result is NEGATIVE SARS-CoV-2 target nucleic acids are NOT DETECTED. The SARS-CoV-2 RNA is generally detectable in upper and lower  respiratory specimens during the acute phase of infection. The lowest  concentration of SARS-CoV-2 viral copies this assay can detect is 250  copies / mL. A negative result does not preclude SARS-CoV-2 infection  and should not be used as the sole basis for treatment or other  patient management decisions.  A negative result may occur with  improper specimen collection / handling, submission of specimen other  than nasopharyngeal swab, presence of viral mutation(s) within the  areas targeted by this assay, and inadequate number of viral copies  (<250 copies / mL). A negative result must be combined with clinical  observations, patient history, and epidemiological information. If  result is POSITIVE SARS-CoV-2 target nucleic acids are DETECTED. The SARS-CoV-2 RNA is generally detectable in upper and lower  respiratory specimens dur ing the acute phase of infection.  Positive  results are indicative of active infection with SARS-CoV-2.  Clinical  correlation with patient history and other diagnostic information is  necessary to determine patient infection status.  Positive results do  not rule out bacterial infection or co-infection with other viruses. If result is PRESUMPTIVE POSTIVE SARS-CoV-2 nucleic acids MAY BE PRESENT.   A presumptive positive result was obtained on the submitted specimen  and confirmed on repeat testing.  While 2019 novel coronavirus  (SARS-CoV-2) nucleic acids may be present in the submitted sample  additional confirmatory testing may be necessary for epidemiological  and / or clinical management purposes  to differentiate between  SARS-CoV-2 and other Sarbecovirus currently known to infect humans.  If clinically indicated additional testing with an alternate test  methodology [id number]) is advised. The SARS-CoV-2 RNA is generally  detectable in upper and lower respiratory sp ecimens during the acute  phase of infection. The expected result is Negative. Fact Sheet for Patients:  StrictlyIdeas.no Fact Sheet for Healthcare Providers: BankingDealers.co.za This test is not yet approved or cleared by the [place] FDA and has been authorized for detection and/or diagnosis of SARS-CoV-2 by FDA under an Emergency Use Authorization (EUA).  This EUA will remain in effect (meaning this test can be used) for the duration of the COVID-19 declaration under Section 564(b)(1) of the Act, 21 U.S.C. section 360bbb-3(b)(1), unless the authorization is terminated or revoked sooner. Performed at [facility], [place] [address] [place], [place] [address]   Respiratory Panel by PCR      Status: None   Collection Time: 01/25/19  1:12 PM  Result Value Ref Range Status   Adenovirus NOT DETECTED NOT DETECTED Final   Coronavirus 229E NOT DETECTED NOT DETECTED Final    Comment: (NOTE) The Coronavirus on the Respiratory Panel, DOES NOT test for the novel  Coronavirus (2019 nCoV)    Coronavirus HKU1 NOT DETECTED NOT DETECTED Final   Coronavirus  NL63 NOT DETECTED NOT DETECTED Final   Coronavirus OC43 NOT DETECTED NOT DETECTED Final   Metapneumovirus NOT DETECTED NOT DETECTED Final   Rhinovirus / Enterovirus NOT DETECTED NOT DETECTED Final   Influenza A NOT DETECTED NOT DETECTED Final   Influenza B NOT DETECTED NOT DETECTED Final   Parainfluenza Virus 1 NOT DETECTED NOT DETECTED Final   Parainfluenza Virus 2 NOT DETECTED NOT DETECTED Final   Parainfluenza Virus 3 NOT DETECTED NOT DETECTED Final   Parainfluenza Virus 4 NOT DETECTED NOT DETECTED Final   Respiratory Syncytial Virus NOT DETECTED NOT DETECTED Final   Bordetella pertussis NOT DETECTED NOT DETECTED Final   Chlamydophila pneumoniae NOT DETECTED NOT DETECTED Final   Mycoplasma pneumoniae NOT DETECTED NOT DETECTED Final    Comment: Performed at [facility], [place] [address], [place], [place] [address]      Radiology Studies: Dg Chest Port 1 View  Result Date: 01/26/2019 CLINICAL DATA:  Dyspnea. EXAM: PORTABLE CHEST 1 VIEW COMPARISON:  Chest radiographs 01/22/2019 and CT 01/23/2019 FINDINGS: An ICD remains in place, and the cardiac silhouette remains enlarged. Central pulmonary vascular congestion and peribronchial cuffing are again seen. Patchy bilateral airspace opacities have mildly worsened from the prior radiographs, most notably in the lung bases. No large pleural effusion or pneumothorax is identified. IMPRESSION: Mild worsening of bilateral airspace opacities which may reflect edema or pneumonia. Electronically Signed   By: Logan Bores M.D.   On: 01/26/2019 11:15    Scheduled Meds: . azithromycin  500 mg  Oral Q24H  . carvedilol  25 mg Oral BID WC  . docusate sodium  100 mg Oral BID  . enoxaparin (LOVENOX) injection  40 mg Subcutaneous Q24H  . folic acid  1 mg Oral Daily  . furosemide  40 mg Intravenous TID PC  . ipratropium-albuterol  3 mL Nebulization BID  . levothyroxine  150 mcg Oral Q0600  . mouth rinse  15 mL Mouth Rinse BID  . pantoprazole  40 mg Oral Daily  . polyethylene glycol  17 g Oral Daily  . sodium chloride flush  3 mL Intravenous Once  . vitamin B-12  50 mcg Oral Daily   Continuous Infusions: . sodium chloride Stopped (01/22/19 1935)  . cefTRIAXone (ROCEPHIN)  IV Stopped (01/26/19 1542)     LOS: 5 days   Assessment & Plan:   Principal Problem:   Acute respiratory failure with hypoxia (HCC) Active Problems:   Hypothyroidism   Essential hypertension   Symptomatic anemia   MDS (myelodysplastic syndrome) (HCC)   Multifocal pneumonia   Respiratory failure (HCC)  Acute hypoxic respiratory failure secondary to multifocal pneumonia versus acute on chronic systolic congestive heart failure: Patient has been tested negative for COVID 19.  She remains afebrile with no leukocytosis. Unremarkable procalcitonin.  Felt better yesterday but no improvement today.  Repeated chest x-ray which shows worsening congestive heart failure.  She still requiring 4 to 5 L of oxygen so I will increase her Lasix to 40 mg 3 times daily.  She has been given incentive spirometry but she is not using it.  I strongly encouraged her to use it every hour because I think atelectasis is possibly contributing to her hypoxia as well.  #2 acute on chronic anemia secondary to MDS hemoglobin 6.1 at the time of admission, she has received 2 units of PRBC transfusion.  Hemoglobin improved and then dropped to 7.0 on 01/25/2019 and she received another unit of transfusion Per oncology recommendations.  Hemoglobin is  stable since then.  #3 hypertension: Blood pressure controlled.  Continue carvedilol and  continue to hold Cozaar.  #4 hypothyroidism continue Synthroid.  DVT prophylaxis: Lovenox Code Status: Full code Family Communication: Plan discussed with the patient.  No family present. Disposition Plan: To be determined pending clinical improvement.   Time spent: 29 minutes   Darliss Cheney, MD [facility] Pager [phone]  If 7PM-7AM, please contact night-coverage [email or web address] Password [facility] 01/27/2019, 12:48 PM

## 2019-01-27 NOTE — Progress Notes (Signed)
Pt Tachy for most of afternoon, also satting at 93% on 6L. MD aware, chest xray ordered.

## 2019-01-27 NOTE — Progress Notes (Signed)
Central Tele notified this RN of 3 beats of V Tach occurring at 1640. Pt resting. Vitals obtained, MD aware.

## 2019-01-28 ENCOUNTER — Inpatient Hospital Stay (HOSPITAL_COMMUNITY): Payer: Medicare Other

## 2019-01-28 ENCOUNTER — Inpatient Hospital Stay: Payer: Self-pay

## 2019-01-28 DIAGNOSIS — R0600 Dyspnea, unspecified: Secondary | ICD-10-CM

## 2019-01-28 DIAGNOSIS — I361 Nonrheumatic tricuspid (valve) insufficiency: Secondary | ICD-10-CM

## 2019-01-28 DIAGNOSIS — I340 Nonrheumatic mitral (valve) insufficiency: Secondary | ICD-10-CM

## 2019-01-28 DIAGNOSIS — D649 Anemia, unspecified: Secondary | ICD-10-CM

## 2019-01-28 DIAGNOSIS — J810 Acute pulmonary edema: Secondary | ICD-10-CM

## 2019-01-28 DIAGNOSIS — I420 Dilated cardiomyopathy: Secondary | ICD-10-CM

## 2019-01-28 LAB — CBC WITH DIFFERENTIAL/PLATELET
Abs Immature Granulocytes: 0.11 10*3/uL — ABNORMAL HIGH (ref 0.00–0.07)
Basophils Absolute: 0 10*3/uL (ref 0.0–0.1)
Basophils Relative: 1 %
Eosinophils Absolute: 0 10*3/uL (ref 0.0–0.5)
Eosinophils Relative: 0 %
HCT: 25.4 % — ABNORMAL LOW (ref 36.0–46.0)
Hemoglobin: 7.8 g/dL — ABNORMAL LOW (ref 12.0–15.0)
Immature Granulocytes: 3 %
Lymphocytes Relative: 15 %
Lymphs Abs: 0.7 10*3/uL (ref 0.7–4.0)
MCH: 32.4 pg (ref 26.0–34.0)
MCHC: 30.7 g/dL (ref 30.0–36.0)
MCV: 105.4 fL — ABNORMAL HIGH (ref 80.0–100.0)
Monocytes Absolute: 0.9 10*3/uL (ref 0.1–1.0)
Monocytes Relative: 20 %
Neutro Abs: 2.8 10*3/uL (ref 1.7–7.7)
Neutrophils Relative %: 61 %
Platelets: 59 10*3/uL — ABNORMAL LOW (ref 150–400)
RBC: 2.41 MIL/uL — ABNORMAL LOW (ref 3.87–5.11)
RDW: 19 % — ABNORMAL HIGH (ref 11.5–15.5)
WBC: 4.4 10*3/uL (ref 4.0–10.5)
nRBC: 7.5 % — ABNORMAL HIGH (ref 0.0–0.2)

## 2019-01-28 LAB — BASIC METABOLIC PANEL
Anion gap: 11 (ref 5–15)
BUN: 18 mg/dL (ref 8–23)
CO2: 45 mmol/L — ABNORMAL HIGH (ref 22–32)
Calcium: 8.1 mg/dL — ABNORMAL LOW (ref 8.9–10.3)
Chloride: 83 mmol/L — ABNORMAL LOW (ref 98–111)
Creatinine, Ser: 0.74 mg/dL (ref 0.44–1.00)
GFR calc Af Amer: >60 mL/min (ref >60)
GFR calc non Af Amer: >60 mL/min (ref >60)
Glucose, Bld: 123 mg/dL — ABNORMAL HIGH (ref 70–99)
Potassium: 3.4 mmol/L — ABNORMAL LOW (ref 3.5–5.1)
Sodium: 139 mmol/L (ref 135–145)

## 2019-01-28 LAB — STREP PNEUMONIAE URINARY ANTIGEN: Strep Pneumo Urinary Antigen: NEGATIVE

## 2019-01-28 LAB — COOXEMETRY PANEL
Carboxyhemoglobin: 3.3 % — ABNORMAL HIGH (ref 0.5–1.5)
Methemoglobin: 0.9 % (ref 0.0–1.5)
O2 Saturation: 58.7 %
Total hemoglobin: 7.7 g/dL — ABNORMAL LOW (ref 12.0–16.0)

## 2019-01-28 LAB — SARS CORONAVIRUS 2 BY RT PCR (HOSPITAL ORDER, PERFORMED IN ~~LOC~~ HOSPITAL LAB): SARS Coronavirus 2: NEGATIVE

## 2019-01-28 LAB — BRAIN NATRIURETIC PEPTIDE
B Natriuretic Peptide: 1441.5 pg/mL — ABNORMAL HIGH (ref 0.0–100.0)
B Natriuretic Peptide: 1421.8 pg/mL — ABNORMAL HIGH (ref 0.0–100.0)

## 2019-01-28 MED ORDER — SODIUM CHLORIDE 0.9% FLUSH
10.0000 mL | Freq: Two times a day (BID) | INTRAVENOUS | Status: DC
  Administered 2019-01-28 – 2019-02-07 (×12): 10 mL

## 2019-01-28 MED ORDER — POTASSIUM CHLORIDE CRYS ER 20 MEQ PO TBCR
40.0000 meq | EXTENDED_RELEASE_TABLET | Freq: Two times a day (BID) | ORAL | Status: AC
  Administered 2019-01-28 (×2): 40 meq via ORAL
  Filled 2019-01-28 (×2): qty 2

## 2019-01-28 MED ORDER — LOSARTAN POTASSIUM 25 MG PO TABS
25.0000 mg | ORAL_TABLET | Freq: Every day | ORAL | Status: DC
  Administered 2019-01-28 – 2019-02-03 (×6): 25 mg via ORAL
  Filled 2019-01-28 (×6): qty 1

## 2019-01-28 MED ORDER — PERFLUTREN LIPID MICROSPHERE
1.0000 mL | INTRAVENOUS | Status: AC | PRN
  Administered 2019-01-28: 2.5 mL via INTRAVENOUS
  Filled 2019-01-28: qty 10

## 2019-01-28 NOTE — Progress Notes (Signed)
Central Tele notified this RN of 5 beats of V Tach occurring at 1051. Vitals obtained, MD aware. Pt resting in bed.

## 2019-01-28 NOTE — Plan of Care (Signed)
Pt still desats significantly when transferring from bed to [facility]. Pt had BM on day shift.

## 2019-01-28 NOTE — Progress Notes (Signed)
Co-OX results received and noted that S02 less than 60.  Results RBV to [place] at [phone].

## 2019-01-28 NOTE — Progress Notes (Signed)
Notified hospitalist on call of blood oxygen results as well. He spoke with Cards and they agree to leave patient on floor for now. Will continue to monitor patient.

## 2019-01-28 NOTE — Consult Note (Signed)
Name: [name] MRN: [id number] DOB: 10/30/1945    ADMISSION DATE:  01/22/2019 CONSULTATION DATE:  01/28/2019   REFERRING MD :  Doristine Bosworth, triad  CHIEF COMPLAINT:  Hypoxia, resp distress  BRIEF PATIENT DESCRIPTION: 73 year old woman with nonischemic cardiomyopathy, EF 25 to 30% and myelodysplastic syndrome admitted 5/12 with bilateral nodular infiltrates, CO VID negative, respiratory viral panel negative. PCCM consulted for worsening infiltrates and hypoxia 5/17  SIGNIFICANT EVENTS  5/14 1 unit PRBC transfusion  STUDIES:  CT angiogram 5/12 diffuse bilateral patchy groundglass infiltrates-edema versus multifocal pneumonia with enlarged pulmonary artery and trace bilateral effusions  Echo 07/2018 diffuse hypokinesis with EF 25 to 30%, moderate MR  HISTORY OF PRESENT ILLNESS: 73 year old woman with a history of nonischemic cardiomyopathy EF 25 to 30% and recent diagnosis of myelodysplastic syndrome with excess blasts, transfusion dependent admitted 5/12 via her PCP for hypoxia 88% on room air with bilateral nodular infiltrates on chest x-ray.  She had a hemoglobin of 6.1, no leukocytosis.  COVID test was negative she was diuresed with Lasix 40 IV twice daily and treated with empiric antibiotics Rocephin and Zithromax Blood cultures and respiratory viral panel was negative She had been transfused 2 units PRBC on 5/4 and was transfused another unit on 5/14 for hemoglobin of 6.8 She is 2.2 L negative per I/o chart No fevers documented on chart review since admission. She has required increasing oxygen up to 6 L and remains dyspneic at rest.   PAST MEDICAL HISTORY :   has a past medical history of Automatic implantable cardioverter-defibrillator in situ, Cataract, Diverticulosis, Dyslipidemia, Ejection fraction < 50%, GERD (gastroesophageal reflux disease), Hypothyroidism, ICD (implantable cardiac defibrillator) in place, Iron deficiency (09/21/2018), LBBB (left bundle branch block),  Macrocytic anemia, MDS (myelodysplastic syndrome) ([place]) (12/27/2018), Mitral regurgitation, Nonischemic cardiomyopathy ([place]), Overweight(278.02), Serrated polyp of colon, and Systolic CHF, chronic ([place]).  has a past surgical history that includes Tonsillectomy (1952); Cardiac defibrillator placement; heart catherization; Cardiac catheterization; Eye surgery (Left); Colonoscopy (N/A, 11/29/2013); Colonoscopy (N/A, 02/25/2015); Esophagogastroduodenoscopy (egd) with propofol (N/A, 08/07/2018); and biopsy (08/07/2018). Prior to Admission medications   Medication Sig Start Date End Date Taking? Authorizing Provider  albuterol (PROVENTIL HFA;VENTOLIN HFA) 108 (90 Base) MCG/ACT inhaler Inhale 2 puffs into the lungs every 6 (six) hours as needed for wheezing or shortness of breath. 11/23/16  Yes Baxley, Cresenciano Lick, MD  carvedilol (COREG) 25 MG tablet TAKE 1 TABLET BY MOUTH TWICE A DAY Patient taking differently: Take 25 mg by mouth 2 (two) times daily with a meal.  04/27/18  Yes Nahser, Wonda Cheng, MD  Cyanocobalamin (VITAMIN B-12 PO) Take 1 tablet by mouth daily. gummies   Yes [provider]  folic acid (FOLVITE) 1 MG tablet Take 1 tablet (1 mg total) by mouth daily. 11/24/18  Yes Baxley, Cresenciano Lick, MD  furosemide (LASIX) 40 MG tablet TAKE 1 TABLET BY MOUTH EVERY DAY Patient taking differently: Take 80 mg by mouth daily.  04/27/18  Yes Nahser, Wonda Cheng, MD  glucose blood (ACCU-CHEK AVIVA) test strip Check daily.  For Diabetes 250.00 05/29/14  Yes Baxley, Cresenciano Lick, MD  ipratropium-albuterol (DUONEB) 0.5-2.5 (3) MG/3ML SOLN Take 3 mLs by nebulization every 4 (four) hours as needed (shortness of breath or wheezing).   Yes [provider]  Lancets MISC Check glucose daily.  Diabetes 250.00 05/29/14  Yes Baxley, Cresenciano Lick, MD  levothyroxine (SYNTHROID, LEVOTHROID) 150 MCG tablet TAKE 1 TABLET BY MOUTH EVERY DAY Patient taking differently: Take 150 mcg by mouth daily before  breakfast.  12/06/18  Yes Baxley, Cresenciano Lick, MD    pantoprazole (PROTONIX) 40 MG tablet Take 1 tablet (40 mg total) by mouth daily. 10/09/18  Yes Pyrtle, Lajuan Lines, MD  prochlorperazine (COMPAZINE) 10 MG tablet Take 1 tablet (10 mg total) by mouth every 6 (six) hours as needed for nausea or vomiting. 01/04/19  Yes Higgs, Mathis Dad, MD  Propylene Glycol (SYSTANE BALANCE) 0.6 % SOLN Place 1 drop into both eyes daily as needed (for dry eyes).   Yes [provider]  losartan (COZAAR) 25 MG tablet TAKE 1 TABLET BY MOUTH EVERY DAY 01/22/19   Evans Lance, MD  omeprazole (PRILOSEC) 40 MG capsule Take 1 capsule (40 mg total) by mouth daily. Patient not taking: Reported on 01/22/2019 01/15/19   Harle Stanford., PA-C   No Known Allergies  FAMILY HISTORY:  family history includes Cancer in her father; Heart attack in her mother; Heart disease in her mother; Hypertension in her mother; Lung cancer in her father; Pancreatic cancer in her father; Stroke in her paternal grandfather. SOCIAL HISTORY:  reports that she has never smoked. She has never used smokeless tobacco. She reports current alcohol use. She reports that she does not use drugs.  REVIEW OF SYSTEMS:   Constitutional: Negative for fever, chills, weight loss, malaise/fatigue and diaphoresis.  HENT: Negative for hearing loss, ear pain, nosebleeds, congestion, sore throat, neck pain, tinnitus and ear discharge.   Eyes: Negative for blurred vision, double vision, photophobia, pain, discharge and redness.  Respiratory: Negative for cough, hemoptysis, sputum production, wheezing and stridor.  Positive for shortness of breath and orthopnea Cardiovascular: Negative for chest pain, palpitations, orthopnea, claudication, leg swelling and PND.  Gastrointestinal: Negative for heartburn, nausea, vomiting, abdominal pain, diarrhea, constipation, blood in stool and melena.  Genitourinary: Negative for dysuria, urgency, frequency, hematuria and flank pain.  Musculoskeletal: Negative for myalgias, back pain, joint  pain and falls.  Skin: Negative for itching and rash.  Neurological: Negative for dizziness, tingling, tremors, sensory change, speech change, focal weakness, seizures, loss of consciousness, weakness and headaches.  Endo/Heme/Allergies: Negative for environmental allergies and polydipsia. Does not bruise/bleed easily.  SUBJECTIVE:   VITAL SIGNS: Temp:  [98.4 F (36.9 C)-98.8 F (37.1 C)] 98.8 F (37.1 C) (05/17 0504) Pulse Rate:  [79-107] 84 (05/17 0504) Resp:  [12-22] 20 (05/17 0504) BP: (102-123)/(42-60) 116/60 (05/17 0504) SpO2:  [93 %-96 %] 93 % (05/17 0504)  PHYSICAL EXAMINATION: Gen. Pleasant, obese, in mild  distress, normal affect ENT - no pallor,icterus, no post nasal drip, class 2 airway Neck: No JVD, no thyromegaly, no carotid bruits Lungs: no use of accessory muscles, no dullness to percussion, decreased with right basal rales no rhonchi  Cardiovascular: Rhythm regular, heart sounds  normal, no murmurs or gallops, no peripheral edema Abdomen: soft and non-tender, no hepatosplenomegaly, BS normal. Musculoskeletal: No deformities, no cyanosis or clubbing Neuro:  alert, non focal, no tremors    Recent Labs  Lab 01/26/19 1006 01/27/19 0537 01/28/19 0322  NA 136 138 139  K 3.4* 3.5 3.4*  CL 88* 85* 83*  CO2 38* 45* 45*  BUN _0 CREATININE 0.80 0.74 0.74  GLUCOSE 156* 120* 123*   Recent Labs  Lab 01/26/19 0516 01/27/19 0537 01/28/19 0322  HGB 8.0* 7.7* 7.8*  HCT 25.8* 24.7* 25.4*  WBC 4.3 3.8* 4.4  PLT 60* 59* 59*   Dg Chest Port 1 View  Result Date: 01/27/2019 CLINICAL DATA:  Decreased oxygen EXAM: PORTABLE CHEST 1  VIEW COMPARISON:  01/26/2019 FINDINGS: Cardiac enlargement with AICD. Diffuse bilateral airspace disease with progression. This is symmetric and could represent edema or pneumonia. No significant pleural effusion. IMPRESSION: Worsening of diffuse bilateral airspace disease which could represent edema or pneumonia. Cardiac enlargement.  Electronically Signed   By: Franchot Gallo M.D.   On: 01/27/2019 19:03   Dg Chest Port 1 View  Result Date: 01/26/2019 CLINICAL DATA:  Dyspnea. EXAM: PORTABLE CHEST 1 VIEW COMPARISON:  Chest radiographs 01/22/2019 and CT 01/23/2019 FINDINGS: An ICD remains in place, and the cardiac silhouette remains enlarged. Central pulmonary vascular congestion and peribronchial cuffing are again seen. Patchy bilateral airspace opacities have mildly worsened from the prior radiographs, most notably in the lung bases. No large pleural effusion or pneumothorax is identified. IMPRESSION: Mild worsening of bilateral airspace opacities which may reflect edema or pneumonia. Electronically Signed   By: Logan Bores M.D.   On: 01/26/2019 11:15    ASSESSMENT / PLAN:  Acute respiratory failure with hypoxia Bilateral infiltrates of unclear etiology -unlikely to be infectious -agree with repeat COVID testing in this clinical situation although initial testing was negative and she has had no fever or cough or sick contacts Differential diagnosis remains acute systolic heart failure with pulmonary edema but surprisingly she has not responded to diuretics. Other possibility could be TRALI although last transfusion was on 5/14.  Would hold off on further transfusions unless hemoglobin less than 7 Bone marrow biopsy was recent, so doubt blast crisis here  Recommend-  Obtain BNP and urine strep antigen for completion If repeat COVID testing is negative, would obtain cardiology consult and discuss if inotropes such as milrinone would be indicated I clarified advanced directives and she would not want life support including mechanical ventilation.  She has no family but has a designated [facility] POA    Kara Mead MD. FCCP. [facility] Pager [phone] If no response call [phone]    01/28/2019, 9:45 AM

## 2019-01-28 NOTE — Progress Notes (Signed)
BNP elevated at 1441 Discussed with nurse to have PIC line started Draw coox from [facility] once placed If Coox less than 60% transfer to stepdown And start milrinone  Jenkins Rouge

## 2019-01-28 NOTE — Progress Notes (Signed)
Peripherally Inserted Central Catheter/Midline Placement  The IV Nurse has discussed with the patient and/or persons authorized to consent for the patient, the purpose of this procedure and the potential benefits and risks involved with this procedure.  The benefits include less needle sticks, lab draws from the catheter, and the patient may be discharged home with the catheter. Risks include, but not limited to, infection, bleeding, blood clot (thrombus formation), and puncture of an artery; nerve damage and irregular heartbeat and possibility to perform a PICC exchange if needed/ordered by physician.  Alternatives to this procedure were also discussed.  Bard Power PICC patient education guide, fact sheet on infection prevention and patient information card has been provided to patient /or left at bedside.    PICC/Midline Placement Documentation  PICC Single Lumen 01/28/19 PICC Right Cephalic 37 cm 0 cm (Active)  Indication for Insertion or Continuance of Line Poor Vasculature-patient has had multiple peripheral attempts or PIVs lasting less than 24 hours 01/28/2019  7:00 PM  Exposed Catheter (cm) 0 cm 01/28/2019  7:00 PM  Site Assessment Bleeding;Other (Comment) 01/28/2019  7:00 PM  Line Status Blood return noted;Flushed;Saline locked 01/28/2019  7:00 PM  Dressing Type Gauze;Transparent 01/28/2019  7:00 PM  Dressing Status Dry;Intact;Antimicrobial disc in place;New drainage;Other (Comment) 01/28/2019  7:00 PM  Line Care Connections checked and tightened 01/28/2019  7:00 PM  Line Adjustment (NICU/IV Team Only) No 01/28/2019  7:00 PM  Dressing Intervention New dressing 01/28/2019  7:00 PM  Dressing Change Due 02/04/19 01/28/2019  7:00 PM       [name] 01/28/2019, 7:07 PM

## 2019-01-28 NOTE — Progress Notes (Addendum)
PROGRESS NOTE    [name]  HYQ:[id number] DOB: 1946/08/16 DOA: 01/22/2019 PCP: Elby Showers, MD   Brief Narrative:  73 year old female who presented with dyspnea and weakness.  She does have significant past medical history for myelodysplastic syndrome and nonischemic cardiomyopathy systolic dysfunction ejection fraction 25 to 30%.  She was seen by her primary care provider on the day of admission, she was found hypoxic down to 80% on room air and her hemoglobin was down to 6.1, she was placed on supplemental oxygen and transferred to the hospital for further evaluation.  She physical examination blood pressure was 103/35, heart rate 77, respiratory rate 20, temperature 98.4, oxygen saturation 96%, her lungs were showing rhonchi bilaterally, heart S1-S2 present and rhythmic, abdomen was soft, no lower extremity edema.  Sodium 132, potassium 3.6, chloride 83, bicarb 31, glucose 122, BUN 22, creatinine 1.0, white count 3.8, hemoglobin 6.1, hematocrit 19.0, platelets 65.  Urinalysis negative for infection.  Her chest x-ray had bilateral nodular infiltrates.  SARS COVID-19 negative.   Patient was admitted to hospital working diagnosis of acute hypoxic respiratory failure due to multifocal pneumonia and was started on IV Rocephin and Zithromax.  I assumed patient's care on 01/24/2019.  Based on my examination and clinical judgment, patient seem to have acute on chronic systolic congestive heart failure so I started the patient on Lasix 40 mg IV twice daily.  She has no leukocytosis, she is afebrile with unremarkable procalcitonin.  Patient continued to get worse with her respiratory status and requires more and more oxygen with worsening of chest x-ray.  Lasix was increased to 40 mg 3 times daily IV on 5/16 and despite of that there is no improvement so I have consulted PCCM today.  Consultants:   PCCM  Procedures:   None  Antimicrobials:   IV Rocephin and Zithromax started on 01/22/2019   Subjective: Patient seen and examined.  She states that she does not feel any different than yesterday and still has shortness of breath, more so with exertion.  Patient has been full code.  I discussed CODE STATUS with the patient and she wanted me to change her to DNR.  I asked her if she would want me to call any of her family member but she stated that she has no family member anywhere.  Objective: Vitals:   01/27/19 1807 01/27/19 2043 01/27/19 2203 01/28/19 0504  BP: (!) 123/58  (!) 102/42 116/60  Pulse: (!) 107  79 84  Resp: 12  16 20   Temp:   98.4 F (36.9 C) 98.8 F (37.1 C)  TempSrc:   Oral Oral  SpO2: 93% 95% 96% 93%  Weight:      Height:        Intake/Output Summary (Last 24 hours) at 01/28/2019 0950 Last data filed at 01/28/2019 0700 Gross per 24 hour  Intake 120 ml  Output 650 ml  Net -530 ml   Filed Weights   01/22/19 1242  Weight: 86.6 kg    Examination:  General exam: Appears calm and comfortable  Respiratory system: Bilateral crackles in all lobes along with some scant rhonchi bilaterally with diminished breath sounds at the bases. Respiratory effort normal. Cardiovascular system: S1 & S2 heard, RRR. No JVD, murmurs, rubs, gallops or clicks.  +2 pitting edema bilateral lower extremity Gastrointestinal system: Abdomen is nondistended, soft and nontender. No organomegaly or masses felt. Normal bowel sounds heard. Central nervous system: Alert and oriented. No focal neurological deficits. Extremities: Symmetric 5 x  5 power. Skin: No rashes, lesions or ulcers Psychiatry: Judgement and insight appear normal. Mood & affect appropriate.  Data Reviewed: I have personally reviewed following labs and imaging studies  CBC: Recent Labs  Lab 01/24/19 1219 01/25/19 0516 01/26/19 0516 01/27/19 0537 01/28/19 0322  WBC 4.2 3.9* 4.3 3.8* 4.4  NEUTROABS 2.6 2.4 2.9 2.5 2.8  HGB 7.6* 7.0* 8.0* 7.7* 7.8*  HCT 25.4* 22.7* 25.8* 24.7* 25.4*  MCV 108.1* 107.6* 103.6*  102.5* 105.4*  PLT 68* 61* 60* 59* 59*   Basic Metabolic Panel: Recent Labs  Lab 01/24/19 1219 01/25/19 0516 01/26/19 1006 01/27/19 0537 01/28/19 0322  NA 139 138 136 138 139  K 4.5 4.3 3.4* 3.5 3.4*  CL 99 94* 88* 85* 83*  CO2 34* 35* 38* 45* 45*  GLUCOSE 128* 121* 156* 120* 123*  BUN 13 13 13 14 18   CREATININE 0.84 0.79 0.80 0.74 0.74  CALCIUM 8.7* 8.3* 8.1* 8.1* 8.1*  MG 1.7 1.6* 1.5*  --   --    GFR: Estimated Creatinine Clearance: 60.8 mL/min (by C-G formula based on SCr of 0.74 mg/dL). Liver Function Tests: Recent Labs  Lab 01/22/19 1247 01/24/19 1219 01/25/19 0516 01/26/19 1006 01/27/19 0537  AST 11* 10* 9* 9* 9*  ALT 21 16 14 13 12   ALKPHOS 55 48 40 42 42  BILITOT 2.0* 1.0 1.3* 1.8* 1.9*  PROT 6.1* 5.8* 5.5* 5.9* 5.8*  ALBUMIN 3.1* 2.9* 2.7* 2.9* 2.8*   No results for input(s): LIPASE, AMYLASE in the last 168 hours. No results for input(s): AMMONIA in the last 168 hours. Coagulation Profile: No results for input(s): INR, PROTIME in the last 168 hours. Cardiac Enzymes: Recent Labs  Lab 01/22/19 1258  TROPONINI <0.03   BNP (last 3 results) No results for input(s): PROBNP in the last 8760 hours. HbA1C: No results for input(s): HGBA1C in the last 72 hours. CBG: No results for input(s): GLUCAP in the last 168 hours. Lipid Profile: No results for input(s): CHOL, HDL, LDLCALC, TRIG, CHOLHDL, LDLDIRECT in the last 72 hours. Thyroid Function Tests: No results for input(s): TSH, T4TOTAL, FREET4, T3FREE, THYROIDAB in the last 72 hours. Anemia Panel: No results for input(s): VITAMINB12, FOLATE, FERRITIN, TIBC, IRON, RETICCTPCT in the last 72 hours. Sepsis Labs: Recent Labs  Lab 01/22/19 1246 01/22/19 2236 01/24/19 1219  PROCALCITON  --   --  <0.10  LATICACIDVEN 1.4 0.9  --     Recent Results (from the past 240 hour(s))  Culture, blood (routine x 2)     Status: None   Collection Time: 01/22/19 12:51 PM  Result Value Ref Range Status   Specimen  Description   Final    BLOOD RIGHT ANTECUBITAL Performed at [facility], [place] [address], [place], [place] [address]    Special Requests   Final    BOTTLES DRAWN AEROBIC AND ANAEROBIC Blood Culture results may not be optimal due to an excessive volume of blood received in culture bottles Performed at [facility] [address], [place], [place] [address]    Culture   Final    NO GROWTH 5 DAYS Performed at [facility], [place] [address], [place], [place] [address]    Report Status 01/27/2019 FINAL  Final  Culture, blood (routine x 2)     Status: None   Collection Time: 01/22/19 12:56 PM  Result Value Ref Range Status   Specimen Description   Final    BLOOD RIGHT ANTECUBITAL Performed at  [facility], [place] [address], [place], [place] [address]    Special Requests   Final    BOTTLES DRAWN AEROBIC AND ANAEROBIC Blood Culture adequate volume Performed at [facility] [address], [place], [place] [address]    Culture   Final    NO GROWTH 5 DAYS Performed at [facility], [place] [address], [place], [place] [address]    Report Status 01/27/2019 FINAL  Final  SARS Coronavirus 2 (CEPHEID- Performed in [place] hospital lab), Hosp Order     Status: None   Collection Time: 01/22/19 12:59 PM  Result Value Ref Range Status   SARS Coronavirus 2 NEGATIVE NEGATIVE Final    Comment: (NOTE) If result is NEGATIVE SARS-CoV-2 target nucleic acids are NOT DETECTED. The SARS-CoV-2 RNA is generally detectable in upper and lower  respiratory specimens during the acute phase of infection. The lowest  concentration of SARS-CoV-2 viral copies this assay can detect is 250  copies / mL. A negative result does not preclude SARS-CoV-2 infection  and should not be used as the sole basis for treatment or other  patient management decisions.  A negative result may occur with  improper specimen collection / handling, submission of specimen  other  than nasopharyngeal swab, presence of viral mutation(s) within the  areas targeted by this assay, and inadequate number of viral copies  (<250 copies / mL). A negative result must be combined with clinical  observations, patient history, and epidemiological information. If result is POSITIVE SARS-CoV-2 target nucleic acids are DETECTED. The SARS-CoV-2 RNA is generally detectable in upper and lower  respiratory specimens dur ing the acute phase of infection.  Positive  results are indicative of active infection with SARS-CoV-2.  Clinical  correlation with patient history and other diagnostic information is  necessary to determine patient infection status.  Positive results do  not rule out bacterial infection or co-infection with other viruses. If result is PRESUMPTIVE POSTIVE SARS-CoV-2 nucleic acids MAY BE PRESENT.   A presumptive positive result was obtained on the submitted specimen  and confirmed on repeat testing.  While 2019 novel coronavirus  (SARS-CoV-2) nucleic acids may be present in the submitted sample  additional confirmatory testing may be necessary for epidemiological  and / or clinical management purposes  to differentiate between  SARS-CoV-2 and other Sarbecovirus currently known to infect humans.  If clinically indicated additional testing with an alternate test  methodology [phone]) is advised. The SARS-CoV-2 RNA is generally  detectable in upper and lower respiratory sp ecimens during the acute  phase of infection. The expected result is Negative. Fact Sheet for Patients:  StrictlyIdeas.no Fact Sheet for Healthcare Providers: BankingDealers.co.za This test is not yet approved or cleared by the [place] FDA and has been authorized for detection and/or diagnosis of SARS-CoV-2 by FDA under an Emergency Use Authorization (EUA).  This EUA will remain in effect (meaning this test can be used) for the duration  of the COVID-19 declaration under Section 564(b)(1) of the Act, 21 U.S.C. section 360bbb-3(b)(1), unless the authorization is terminated or revoked sooner. Performed at [facility], [place] [address] [place], [place] [address]   Respiratory Panel by PCR     Status: None   Collection Time: 01/25/19  1:12 PM  Result Value Ref Range Status   Adenovirus NOT DETECTED NOT DETECTED Final   Coronavirus 229E NOT DETECTED NOT DETECTED Final    Comment: (NOTE) The Coronavirus on the Respiratory Panel, DOES  NOT test for the novel  Coronavirus (2019 nCoV)    Coronavirus HKU1 NOT DETECTED NOT DETECTED Final   Coronavirus NL63 NOT DETECTED NOT DETECTED Final   Coronavirus OC43 NOT DETECTED NOT DETECTED Final   Metapneumovirus NOT DETECTED NOT DETECTED Final   Rhinovirus / Enterovirus NOT DETECTED NOT DETECTED Final   Influenza A NOT DETECTED NOT DETECTED Final   Influenza B NOT DETECTED NOT DETECTED Final   Parainfluenza Virus 1 NOT DETECTED NOT DETECTED Final   Parainfluenza Virus 2 NOT DETECTED NOT DETECTED Final   Parainfluenza Virus 3 NOT DETECTED NOT DETECTED Final   Parainfluenza Virus 4 NOT DETECTED NOT DETECTED Final   Respiratory Syncytial Virus NOT DETECTED NOT DETECTED Final   Bordetella pertussis NOT DETECTED NOT DETECTED Final   Chlamydophila pneumoniae NOT DETECTED NOT DETECTED Final   Mycoplasma pneumoniae NOT DETECTED NOT DETECTED Final    Comment: Performed at [facility], [place] [address], [place], [place] [address]      Radiology Studies: Dg Chest Port 1 View  Result Date: 01/27/2019 CLINICAL DATA:  Decreased oxygen EXAM: PORTABLE CHEST 1 VIEW COMPARISON:  01/26/2019 FINDINGS: Cardiac enlargement with AICD. Diffuse bilateral airspace disease with progression. This is symmetric and could represent edema or pneumonia. No significant pleural effusion. IMPRESSION: Worsening of diffuse bilateral airspace disease which could represent edema or pneumonia.  Cardiac enlargement. Electronically Signed   By: Franchot Gallo M.D.   On: 01/27/2019 19:03   Dg Chest Port 1 View  Result Date: 01/26/2019 CLINICAL DATA:  Dyspnea. EXAM: PORTABLE CHEST 1 VIEW COMPARISON:  Chest radiographs 01/22/2019 and CT 01/23/2019 FINDINGS: An ICD remains in place, and the cardiac silhouette remains enlarged. Central pulmonary vascular congestion and peribronchial cuffing are again seen. Patchy bilateral airspace opacities have mildly worsened from the prior radiographs, most notably in the lung bases. No large pleural effusion or pneumothorax is identified. IMPRESSION: Mild worsening of bilateral airspace opacities which may reflect edema or pneumonia. Electronically Signed   By: Logan Bores M.D.   On: 01/26/2019 11:15    Scheduled Meds: . azithromycin  500 mg Oral Q24H  . carvedilol  25 mg Oral BID WC  . docusate sodium  100 mg Oral BID  . enoxaparin (LOVENOX) injection  40 mg Subcutaneous Q24H  . folic acid  1 mg Oral Daily  . furosemide  40 mg Intravenous TID PC  . ipratropium-albuterol  3 mL Nebulization BID  . levothyroxine  150 mcg Oral Q0600  . mouth rinse  15 mL Mouth Rinse BID  . pantoprazole  40 mg Oral Daily  . polyethylene glycol  17 g Oral Daily  . potassium chloride  40 mEq Oral BID  . sodium chloride flush  3 mL Intravenous Once  . vitamin B-12  50 mcg Oral Daily   Continuous Infusions: . sodium chloride Stopped (01/22/19 1935)  . cefTRIAXone (ROCEPHIN)  IV 1 g (01/27/19 1456)     LOS: 6 days   Assessment & Plan:   Principal Problem:   Acute respiratory failure with hypoxia (HCC) Active Problems:   Hypothyroidism   Essential hypertension   Symptomatic anemia   MDS (myelodysplastic syndrome) (HCC)   Multifocal pneumonia   Respiratory failure (HCC)  #1 Acute hypoxic respiratory failure secondary to multifocal pneumonia versus acute on chronic systolic congestive heart failure: Patient has been tested negative for COVID 19.  She remains  afebrile with no leukocytosis. Unremarkable procalcitonin.  Does not feel any different than yesterday.  Oxygen requirement  has been increased and currently on 6 L with chest x-ray repeated last night shows worsening opacity bilaterally.  Continues to be on Lasix 40 mg 3 times daily.  Due to no improvement, I have consulted PCCM today.  Due to having clinical picture suggestive of COVID-19, I will retest her for that.   #2 acute on chronic anemia secondary to MDS hemoglobin 6.1 at the time of admission, she has received 2 units of PRBC transfusion.  Hemoglobin improved and then dropped to 7.0 on 01/25/2019 and she received another unit of transfusion Per oncology recommendations.  Hemoglobin is stable since then.  #3 hypertension: Blood pressure controlled.  Continue carvedilol and continue to hold Cozaar.  #4 hypothyroidism continue Synthroid.  #5 hypokalemia: Replace orally and recheck in the morning.  DVT prophylaxis: Lovenox Code Status: Full code Family Communication: Plan discussed with the patient.  No family present. Disposition Plan: To be determined pending clinical improvement.   Time spent: 30 minutes   Darliss Cheney, MD [facility] Pager [phone]  If 7PM-7AM, please contact night-coverage [email or web address] Password [email or web address] 01/28/2019, 9:50 AM

## 2019-01-28 NOTE — Progress Notes (Signed)
  Echocardiogram 2D Echocardiogram with definity has been performed.  Darlina Sicilian M 01/28/2019, 2:04 PM

## 2019-01-28 NOTE — Progress Notes (Signed)
Notified Rose, fellow on call for cardiology, of O2 level of 58.7 based on blood drawn from PICC. He states that there is no reason to move patient to step down based on this level. Will continue to monitor patient.

## 2019-01-28 NOTE — Progress Notes (Signed)
This RN clarified with Dr. Dulcy Fanny line order and coox check. Dr. Johnsie Cancel would like PIC line placed and coox checked to determine need for further treatment.

## 2019-01-28 NOTE — Consult Note (Signed)
CARDIOLOGY CONSULT NOTE       Patient ID: [name] MRN: [id number] DOB/AGE: [birth year] 73 y.o.  Admit date: 01/22/2019 Referring Physician: Elsworth Soho Primary Physician: Elby Showers, MD Primary Cardiologist: Nah[place] Reason for Consultation: CHF  Principal Problem:   Acute respiratory failure with hypoxia [facility]) Active Problems:   Hypothyroidism   Essential hypertension   Symptomatic anemia   MDS (myelodysplastic syndrome) (HCC)   Multifocal pneumonia   Respiratory failure (HCC)   HPI:  73 y.o. admitted 5/11 with severe anemia. 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 Echo 07/17/18 EF 35-30% She has AICD in place with no recent d/c . After transfusion has had increasing dyspnea with CXR showing worsening diffuse bilateral airspace disease CT suggested multi focal pneumonia vs pulmonary edema ground glass appearance. No PE  She has not responded well to diuresis BNP is pending K 3.4 Cr 0.74 ARB held on admission On coreg   ROS All other systems reviewed and negative except as noted above  Past Medical History:  Diagnosis Date   Automatic implantable cardioverter-defibrillator in situ    Dr. Beckie Salts follows-next visit- [phone]   Cataract    Diverticulosis    Dyslipidemia    Ejection fraction < 50%    EF 25-30%, September, 2010   GERD (gastroesophageal reflux disease)    Hypothyroidism    ICD (implantable cardiac defibrillator) in place    10/2009; Dr. Lovena Le   Iron deficiency 09/21/2018   LBBB (left bundle branch block)    old   Macrocytic anemia    MDS (myelodysplastic syndrome) ([place]) 12/27/2018   Mitral regurgitation    Mild, echo, September, 2010   Nonischemic cardiomyopathy [facility])    Normal coronary arteries, catheterization, September, 2009   Overweight(278.02)    Serrated polyp of colon    Systolic CHF, chronic ([place])    no recent problems    Family  History  Problem Relation Age of Onset   Heart attack Mother    Heart disease Mother    Hypertension Mother    Lung cancer Father    Pancreatic cancer Father    Cancer Father    Stroke Paternal Grandfather    Colon cancer Neg Hx    Diabetes Neg Hx    Kidney disease Neg Hx    Liver disease Neg Hx     Social History   Socioeconomic History   Marital status: Single    Spouse name: Not on file   Number of children: Not on file   Years of education: Not on file   Highest education level: Not on file  Occupational History   Not on file  Social Needs   Financial resource strain: Not on file   Food insecurity:    Worry: Not on file    Inability: Not on file   Transportation needs:    Medical: Not on file    Non-medical: Not on file  Tobacco Use   Smoking status: Never Smoker   Smokeless tobacco: Never Used  Substance and Sexual Activity   Alcohol use: Yes    Comment: 2-4 beers per week   Drug use: No   Sexual activity: Never    Birth control/protection: Abstinence  Lifestyle   Physical activity:    Days per week: Not on file    Minutes per session: Not on file   Stress: Not on file  Relationships  Social connections:    Talks on phone: Not on file    Gets together: Not on file    Attends religious service: Not on file    Active member of club or organization: Not on file    Attends meetings of clubs or organizations: Not on file    Relationship status: Not on file   Intimate partner violence:    Fear of current or ex partner: Not on file    Emotionally abused: Not on file    Physically abused: Not on file    Forced sexual activity: Not on file  Other Topics Concern   Not on file  Social History Narrative   Not on file    Past Surgical History:  Procedure Laterality Date   BIOPSY  08/07/2018   Procedure: BIOPSY;  Surgeon: Jerene Bears, MD;  Location: [facility];  Service: Gastroenterology;;   CARDIAC CATHETERIZATION      '09 last   [place] 11/29/2013   Procedure: COLONOSCOPY;  Surgeon: Jerene Bears, MD;  Location: [place];  Service: Gastroenterology;  Laterality: N/A;   COLONOSCOPY N/A 02/25/2015   Procedure: COLONOSCOPY;  Surgeon: Jerene Bears, MD;  Location: [facility];  Service: Gastroenterology;  Laterality: N/A;   ESOPHAGOGASTRODUODENOSCOPY (EGD) WITH PROPOFOL N/A 08/07/2018   Procedure: ESOPHAGOGASTRODUODENOSCOPY (EGD) WITH PROPOFOL;  Surgeon: Jerene Bears, MD;  Location: [facility];  Service: Gastroenterology;  Laterality: N/A;   EYE SURGERY Left    x 3-"droopy eyelid"   heart catherization     TONSILLECTOMY  1952      azithromycin  500 mg Oral Q24H   carvedilol  25 mg Oral BID WC   docusate sodium  100 mg Oral BID   enoxaparin (LOVENOX) injection  40 mg Subcutaneous F16B   folic acid  1 mg Oral Daily   furosemide  40 mg Intravenous TID PC   ipratropium-albuterol  3 mL Nebulization BID   levothyroxine  150 mcg Oral Q0600   mouth rinse  15 mL Mouth Rinse BID   pantoprazole  40 mg Oral Daily   polyethylene glycol  17 g Oral Daily   potassium chloride  40 mEq Oral BID   sodium chloride flush  3 mL Intravenous Once   vitamin B-12  50 mcg Oral Daily    sodium chloride Stopped (01/22/19 1935)   cefTRIAXone (ROCEPHIN)  IV 1 g (01/27/19 1456)    Physical Exam: Blood pressure 116/60, pulse 84, temperature 98.8 F (37.1 C), temperature source Oral, resp. rate 20, height 4\' 11"  (1.499 m), weight 86.6 kg, SpO2 93 %.   Affect appropriate Chronically ill pale female  Mild tacypnea HEENT: normal Neck supple with no adenopathy JVP normal no bruits no thyromegaly Lungs course interstitial crackles throughout  Heart:  S1/S2 no murmur, no rub, gallop or click PMI enlarged AICD under left clavicle  Abdomen: benighn, BS positve, no tenderness, no AAA no bruit.  No HSM or HJR Distal pulses intact with no bruits No  edema Neuro non-focal Skin warm and dry No muscular weakness   Labs:   Lab Results  Component Value Date   WBC 4.4 01/28/2019   HGB 7.8 (L) 01/28/2019   HCT 25.4 (L) 01/28/2019   MCV 105.4 (H) 01/28/2019   PLT 59 (L) 01/28/2019    Recent Labs  Lab 01/27/19 0537 01/28/19 0322  NA 138 139  K 3.5 3.4*  CL 85* 83*  CO2 45* 45*  BUN  14 18  CREATININE 0.74 0.74  CALCIUM 8.1* 8.1*  PROT 5.8*  --   BILITOT 1.9*  --   ALKPHOS 42  --   ALT 12  --   AST 9*  --   GLUCOSE 120* 123*   Lab Results  Component Value Date   CKTOTAL 80 12/02/2008   CKMB 1.3 12/02/2008   TROPONINI <0.03 01/22/2019    Lab Results  Component Value Date   CHOL 165 05/12/2018   CHOL 185 05/03/2017   CHOL 172 11/02/2016   Lab Results  Component Value Date   HDL 48 (L) 05/12/2018   HDL 58 05/03/2017   HDL 48 (L) 11/02/2016   Lab Results  Component Value Date   LDLCALC 101 (H) 05/12/2018   LDLCALC 112 (H) 05/03/2017   LDLCALC 109 (H) 11/02/2016   Lab Results  Component Value Date   TRIG 73 05/12/2018   TRIG 77 05/03/2017   TRIG 77 11/02/2016   Lab Results  Component Value Date   CHOLHDL 3.4 05/12/2018   CHOLHDL 3.2 05/03/2017   CHOLHDL 3.6 11/02/2016   No results found for: LDLDIRECT    Radiology: Dg Chest 2 View  Result Date: 01/22/2019 CLINICAL DATA:  Shortness of breath and fatigue. History of myelodysplasia EXAM: CHEST - 2 VIEW COMPARISON:  August 07, 2018 FINDINGS: There is patchy airspace consolidation in both upper lobes. There is also more subtle airspace consolidation in the right lower lobe. 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 leads are attached to the right atrium and right ventricle. No adenopathy. No bone lesions. IMPRESSION: Multifocal airspace consolidation. Suspect multifocal pneumonia. Atypical organism pneumonia a differential consideration. There is pulmonary vascular congestion. No appreciable interstitial edema. Pacemaker leads  attached to right atrium and right ventricle. These results will be called to the ordering clinician or representative by the Radiologist Assistant, and communication documented in the PACS or zVision Dashboard. Electronically Signed   By: Lowella Grip III M.D.   On: 01/22/2019 11:42   Ct Angio Chest Pe W Or Wo Contrast  Result Date: 01/23/2019 CLINICAL DATA:  Acute hypoxic respiratory failure. EXAM: CT ANGIOGRAPHY CHEST WITH CONTRAST TECHNIQUE: Multidetector CT imaging of the chest was performed using the standard protocol during bolus administration of intravenous contrast. Multiplanar CT image reconstructions and MIPs were obtained to evaluate the vascular anatomy. CONTRAST:  137mL OMNIPAQUE IOHEXOL 350 MG/ML SOLN COMPARISON:  Chest x-ray from yesterday. FINDINGS: Cardiovascular: Satisfactory opacification of the pulmonary arteries to the segmental level. No evidence of pulmonary embolism. Enlarged main pulmonary artery measuring up to 4.0 cm. Moderate cardiomegaly. No pericardial effusion. No thoracic aortic aneurysm or dissection. Mild atherosclerotic calcification of the aortic arch. Left chest wall pacemaker. Mediastinum/Nodes: Mildly enlarged AP window lymph node measuring 1.5 cm in short axis. No additional enlarged mediastinal lymph nodes. No enlarged hilar or axillary lymph nodes. The thyroid gland, trachea, and esophagus demonstrate no significant findings. Lungs/Pleura: Moderate diffuse patchy peribronchovascular ground-glass densities and consolidation throughout both lungs. No pneumothorax. Upper Abdomen: No acute abnormality.  Cholelithiasis. Musculoskeletal: No chest wall abnormality. No acute or significant osseous findings. Review of the MIP images confirms the above findings. IMPRESSION: 1. Moderate diffuse patchy peribronchovascular ground-glass densities and consolidation throughout both lungs could reflect pulmonary edema or multifocal pneumonia. 2.  No evidence of pulmonary embolism.  3. Trace bilateral pleural effusions. 4. Moderately enlarged main pulmonary artery, suggestive of pulmonary arterial hypertension. 5. Mildly enlarged AP window lymph node, likely reactive. 6.  Aortic atherosclerosis (ICD10-I70.0).  7. Cholelithiasis. Electronically Signed   By: Titus Dubin M.D.   On: 01/23/2019 15:42   Dg Chest Port 1 View  Result Date: 01/27/2019 CLINICAL DATA:  Decreased oxygen EXAM: PORTABLE CHEST 1 VIEW COMPARISON:  01/26/2019 FINDINGS: Cardiac enlargement with AICD. Diffuse bilateral airspace disease with progression. This is symmetric and could represent edema or pneumonia. No significant pleural effusion. IMPRESSION: Worsening of diffuse bilateral airspace disease which could represent edema or pneumonia. Cardiac enlargement. Electronically Signed   By: Franchot Gallo M.D.   On: 01/27/2019 19:03   Dg Chest Port 1 View  Result Date: 01/26/2019 CLINICAL DATA:  Dyspnea. EXAM: PORTABLE CHEST 1 VIEW COMPARISON:  Chest radiographs 01/22/2019 and CT 01/23/2019 FINDINGS: An ICD remains in place, and the cardiac silhouette remains enlarged. Central pulmonary vascular congestion and peribronchial cuffing are again seen. Patchy bilateral airspace opacities have mildly worsened from the prior radiographs, most notably in the lung bases. No large pleural effusion or pneumothorax is identified. IMPRESSION: Mild worsening of bilateral airspace opacities which may reflect edema or pneumonia. Electronically Signed   By: Logan Bores M.D.   On: 01/26/2019 11:15    EKG: SR LBBB rate 74   ASSESSMENT AND PLAN:   CHF:  BNP pending if very elevated will start PIC line and check Coox for mixed venous saturation Consider milrinone if <60%. I discussed with Dr Jeffie Pollock and coox may not be accurate in setting of severe anemia. However CO should be high in setting of anemia Continue iv lasix Resume ARB continue coreg As Dr Elsworth Soho indicated unusual if CHF that she has not responded to diuretic with  normal CR. Agree it may be ARDS with leaky capillary syndrome from infection or blood transfusion She has seen Dr Lovena Le with normal AICD function and given co morbidities he did not feel she was a candidate for CRT upgrade Also note patient is DNR and does not want mechanical ventilation Continue to cover with Zithromax and Rocephin    Signed: Jenkins Rouge 01/28/2019, 11:24 AM

## 2019-01-29 ENCOUNTER — Inpatient Hospital Stay: Payer: Medicare Other

## 2019-01-29 ENCOUNTER — Other Ambulatory Visit: Payer: Self-pay

## 2019-01-29 DIAGNOSIS — I5023 Acute on chronic systolic (congestive) heart failure: Secondary | ICD-10-CM

## 2019-01-29 DIAGNOSIS — I5021 Acute systolic (congestive) heart failure: Secondary | ICD-10-CM

## 2019-01-29 LAB — CBC WITH DIFFERENTIAL/PLATELET
Abs Immature Granulocytes: 0.08 10*3/uL — ABNORMAL HIGH (ref 0.00–0.07)
Basophils Absolute: 0 10*3/uL (ref 0.0–0.1)
Basophils Relative: 0 %
Eosinophils Absolute: 0 10*3/uL (ref 0.0–0.5)
Eosinophils Relative: 0 %
HCT: 24 % — ABNORMAL LOW (ref 36.0–46.0)
Hemoglobin: 7.2 g/dL — ABNORMAL LOW (ref 12.0–15.0)
Immature Granulocytes: 2 %
Lymphocytes Relative: 18 %
Lymphs Abs: 0.7 10*3/uL (ref 0.7–4.0)
MCH: 32.3 pg (ref 26.0–34.0)
MCHC: 30 g/dL (ref 30.0–36.0)
MCV: 107.6 fL — ABNORMAL HIGH (ref 80.0–100.0)
Monocytes Absolute: 0.7 10*3/uL (ref 0.1–1.0)
Monocytes Relative: 19 %
Neutro Abs: 2.2 10*3/uL (ref 1.7–7.7)
Neutrophils Relative %: 61 %
Platelets: 56 10*3/uL — ABNORMAL LOW (ref 150–400)
RBC: 2.23 MIL/uL — ABNORMAL LOW (ref 3.87–5.11)
RDW: 19.1 % — ABNORMAL HIGH (ref 11.5–15.5)
WBC: 3.7 10*3/uL — ABNORMAL LOW (ref 4.0–10.5)
nRBC: 10.4 % — ABNORMAL HIGH (ref 0.0–0.2)

## 2019-01-29 LAB — MRSA PCR SCREENING: MRSA by PCR: POSITIVE — AB

## 2019-01-29 LAB — BASIC METABOLIC PANEL
Anion gap: 9 (ref 5–15)
BUN: 26 mg/dL — ABNORMAL HIGH (ref 8–23)
CO2: 46 mmol/L — ABNORMAL HIGH (ref 22–32)
Calcium: 8 mg/dL — ABNORMAL LOW (ref 8.9–10.3)
Chloride: 85 mmol/L — ABNORMAL LOW (ref 98–111)
Creatinine, Ser: 0.7 mg/dL (ref 0.44–1.00)
GFR calc Af Amer: >60 mL/min (ref >60)
GFR calc non Af Amer: >60 mL/min (ref >60)
Glucose, Bld: 125 mg/dL — ABNORMAL HIGH (ref 70–99)
Potassium: 4.3 mmol/L (ref 3.5–5.1)
Sodium: 140 mmol/L (ref 135–145)

## 2019-01-29 LAB — COOXEMETRY PANEL
Carboxyhemoglobin: 3.1 % — ABNORMAL HIGH (ref 0.5–1.5)
Methemoglobin: 0.9 % (ref 0.0–1.5)
O2 Saturation: 71.2 %
Total hemoglobin: 7.1 g/dL — ABNORMAL LOW (ref 12.0–16.0)

## 2019-01-29 LAB — MAGNESIUM: Magnesium: 1.9 mg/dL (ref 1.7–2.4)

## 2019-01-29 MED ORDER — CHLORHEXIDINE GLUCONATE CLOTH 2 % EX PADS
6.0000 | MEDICATED_PAD | Freq: Every day | CUTANEOUS | Status: DC
  Administered 2019-01-29 – 2019-02-02 (×5): 6 via TOPICAL

## 2019-01-29 MED ORDER — MUPIROCIN 2 % EX OINT
1.0000 "application " | TOPICAL_OINTMENT | Freq: Two times a day (BID) | CUTANEOUS | Status: AC
  Administered 2019-01-29 – 2019-02-02 (×10): 1 via NASAL
  Filled 2019-01-29: qty 22

## 2019-01-29 MED ORDER — MILRINONE LACTATE IN DEXTROSE 20-5 MG/100ML-% IV SOLN
0.2500 ug/kg/min | INTRAVENOUS | Status: DC
  Administered 2019-01-29 – 2019-01-31 (×4): 0.25 ug/kg/min via INTRAVENOUS
  Filled 2019-01-29 (×4): qty 100

## 2019-01-29 MED ORDER — BISACODYL 10 MG RE SUPP
10.0000 mg | Freq: Once | RECTAL | Status: AC
  Administered 2019-01-29: 10:00:00 10 mg via RECTAL
  Filled 2019-01-29: qty 1

## 2019-01-29 NOTE — Progress Notes (Signed)
Progress Note  Due to the COVID-19 pandemic, this visit was completed with telemedicine (audio/video) technology to reduce patient and provider exposure as well as to preserve personal protective equipment.   Patient Name: [name] Date of Encounter: 01/29/2019  Primary Cardiologist: [name], MD   Subjective   Pt with SOB BiPAP added and transferring to stepdown/ICU.  Inpatient Medications    Scheduled Meds: . bisacodyl  10 mg Rectal Once  . carvedilol  25 mg Oral BID WC  . docusate sodium  100 mg Oral BID  . enoxaparin (LOVENOX) injection  40 mg Subcutaneous Q24H  . folic acid  1 mg Oral Daily  . furosemide  40 mg Intravenous TID PC  . ipratropium-albuterol  3 mL Nebulization BID  . levothyroxine  150 mcg Oral Q0600  . losartan  25 mg Oral Daily  . mouth rinse  15 mL Mouth Rinse BID  . pantoprazole  40 mg Oral Daily  . polyethylene glycol  17 g Oral Daily  . sodium chloride flush  10-40 mL Intracatheter Q12H  . sodium chloride flush  3 mL Intravenous Once  . vitamin B-12  50 mcg Oral Daily   Continuous Infusions: . sodium chloride Stopped (01/22/19 1935)  . cefTRIAXone (ROCEPHIN)  IV Stopped (01/28/19 1602)   PRN Meds: albuterol, heparin lock flush, heparin lock flush, polyethylene glycol, prochlorperazine   Vital Signs    Vitals:   01/28/19 2025 01/28/19 2159 01/29/19 0649 01/29/19 0832  BP:  (!) 108/44 (!) 114/48   Pulse:  80 88   Resp:   (!) 22   Temp:   98.2 F (36.8 C)   TempSrc:   Oral   SpO2: 93%  92% 93%  Weight:      Height:        Intake/Output Summary (Last 24 hours) at 01/29/2019 0916 Last data filed at 01/29/2019 0701 Gross per 24 hour  Intake 360 ml  Output 1050 ml  Net -690 ml   Last 3 Weights 01/22/2019 01/08/2019 12/27/2018  Weight (lbs) 191 lb 189 lb 8 oz 191 lb 3.2 oz  Weight (kg) 86.637 kg 85.957 kg 86.728 kg      Telemetry    SR with PVCs and NSVT - Personally Reviewed  ECG    No AM EKG  Physical Exam    General: Well developed, well nourished, NAD  HEENT: OP clear, mucus membranes moist  SKIN: warm, dry. No rashes. Neuro: No focal deficits  Musculoskeletal: Muscle strength 5/5 all ext  Psychiatric: Mood and affect normal  Neck: No JVD, no carotid bruits, no thyromegaly, no lymphadenopathy.  Lungs:Clear bilaterally, no wheezes, rhonci, crackles Cardiovascular: Regular rate and rhythm. No murmurs, gallops or rubs. Abdomen:Soft. Bowel sounds present. Non-tender.  Extremities: No lower extremity edema. Pulses are 2 + in the bilateral DP/PT.  Labs    Chemistry Recent Labs  Lab 01/25/19 0516 01/26/19 1006 01/27/19 0537 01/28/19 0322 01/29/19 0357  NA 138 136 138 139 140  K 4.3 3.4* 3.5 3.4* 4.3  CL 94* 88* 85* 83* 85*  CO2 35* 38* 45* 45* 46*  GLUCOSE 121* 156* 120* 123* 125*  BUN 13 13 14 18  26*  CREATININE 0.79 0.80 0.74 0.74 0.70  CALCIUM 8.3* 8.1* 8.1* 8.1* 8.0*  PROT 5.5* 5.9* 5.8*  --   --   ALBUMIN 2.7* 2.9* 2.8*  --   --   AST 9* 9* 9*  --   --   ALT 14 13 12   --   --  ALKPHOS 40 42 42  --   --   BILITOT 1.3* 1.8* 1.9*  --   --   GFRNONAA >60 >60 >60 >60 >60  GFRAA >60 >60 >60 >60 >60  ANIONGAP 9 10 8 11 9      Hematology Recent Labs  Lab 01/27/19 0537 01/28/19 0322 01/29/19 0357  WBC 3.8* 4.4 3.7*  RBC 2.41* 2.41* 2.23*  HGB 7.7* 7.8* 7.2*  HCT 24.7* 25.4* 24.0*  MCV 102.5* 105.4* 107.6*  MCH 32.0 32.4 32.3  MCHC 31.2 30.7 30.0  RDW 19.9* 19.0* 19.1*  PLT 59* 59* 56*    Cardiac Enzymes Recent Labs  Lab 01/22/19 1258  TROPONINI <0.03   No results for input(s): TROPIPOC in the last 168 hours.   BNP Recent Labs  Lab 01/24/19 1219 01/28/19 0322 01/28/19 1100  BNP 1,387.4* 1,441.5* 1,421.8*     DDimer No results for input(s): DDIMER in the last 168 hours.   Radiology    Dg Chest Port 1 View  Result Date: 01/27/2019 CLINICAL DATA:  Decreased oxygen EXAM: PORTABLE CHEST 1 VIEW COMPARISON:  01/26/2019 FINDINGS: Cardiac enlargement with  AICD. Diffuse bilateral airspace disease with progression. This is symmetric and could represent edema or pneumonia. No significant pleural effusion. IMPRESSION: Worsening of diffuse bilateral airspace disease which could represent edema or pneumonia. Cardiac enlargement. Electronically Signed   By: Franchot Gallo M.D.   On: 01/27/2019 19:03   [place] Ekg Site Rite  Result Date: 01/28/2019 If Site Rite image not attached, placement could not be confirmed due to current cardiac rhythm.   Cardiac Studies   Echo 01/28/19 IMPRESSIONS    1. The left ventricle has severely reduced systolic function, with an ejection fraction of 20-25%. The cavity size was severely dilated. Left ventricular diastolic Doppler parameters are consistent with pseudonormalization. Elevated left ventricular  end-diastolic pressure Left ventricular diffuse hypokinesis. No evidence of LV thrombus by definity contrast study.  2. The right ventricle has normal systolic function. The cavity was normal. There is no increase in right ventricular wall thickness. Right ventricular systolic pressure is moderately elevated with an estimated pressure of 54.9 mmHg.  3. Left atrial size was moderately dilated.  4. There is mild mitral annular calcification present. Mitral valve regurgitation is mild by color flow Doppler.  5. The aortic valve is tricuspid. There is trivial AR.  6. The inferior vena cava was dilated in size with <50% respiratory variability.  FINDINGS  Left Ventricle: The left ventricle has severely reduced systolic function, with an ejection fraction of 20-25%. The cavity size was severely dilated. There is no increase in left ventricular wall thickness. Left ventricular diastolic Doppler parameters  are consistent with pseudonormalization. Elevated left ventricular end-diastolic pressure Left ventricular diffuse hypokinesis. Definity contrast agent was given IV to delineate the left ventricular endocardial borders.   Right Ventricle: The right ventricle has normal systolic function. The cavity was normal. There is no increase in right ventricular wall thickness. Right ventricular systolic pressure is moderately elevated with an estimated pressure of 54.9 mmHg. Pacing  wire/catheter visualized in the right ventricle.  Left Atrium: Left atrial size was moderately dilated.  Right Atrium: Right atrial size was normal in size. Right atrial pressure is estimated at 15 mmHg.  Interatrial Septum: No atrial level shunt detected by color flow Doppler.  Pericardium: There is no evidence of pericardial effusion.  Mitral Valve: The mitral valve is normal in structure. There is mild mitral annular calcification present. Mitral valve regurgitation is mild by  color flow Doppler.  Tricuspid Valve: The tricuspid valve is normal in structure. Tricuspid valve regurgitation is mild by color flow Doppler.  Aortic Valve: The aortic valve is tricuspid Aortic valve regurgitation was not visualized by color flow Doppler.  Pulmonic Valve: The pulmonic valve was normal in structure. Pulmonic valve regurgitation is not visualized by color flow Doppler.  Venous: The inferior vena cava measures 2.10 cm, is dilated in size with less than 50% respiratory variability.      Patient Profile     73 y.o. female admitted 5/11 with severe anemia and hypoxia. 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 Echo 07/17/18 EF 35-30% She has AICD in place with no recent d/c . After transfusion has had increasing dyspnea with CXR showing worsening diffuse bilateral airspace disease CT suggested multi focal pneumonia vs pulmonary edema ground glass appearance. No PE  She has not responded well to diuresis BNP is pending K 3.4 Cr 0.74 ARB held on admission On coreg   Assessment & Plan    Acute on chronic systolic HF.  EF 2019 at 30-35% and now EF  20-25% --BNP 14218 on 01/28/19 --Lasix 40 mg TID IV --I&O neg 610 last 24 hours and neg 2954 since admit no weights. Will re-order  --Cr 0.70   --Co-ox 59, which per Dr. Kyla Balzarine note should be high in setting of anemia.   --now with increased hypoxia - BiPAP added,  May need Milrinone. Vs. Transfer to [facility] for HF management.  --on coreg 25 BID and losartan 25 mg once daily  Acute anemia hgb 6.1 on admit  --rec'd 2 units PRBCs. --today hgb 7.2   Thrombocytopenia today plts 56K (65K on admit)  Hx of AICD with no shocks. Last visit with Dr. Lovena Le he did not believe she was candidate for up grade with her co morbidities.  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  Currently in process.  --CCM to see   NSVT of 5 beats yesterday   HTN pt is on coreg 25 BID,   Cephid test for COVID neg on the 11th.   And again on 01/28/19. Afebrile    MDS per IM (myelodysplastic syndrome)  hypomagnesium on admit at 1.5 will recheck. Would replete   Pt is DNR       For questions or updates, please contact [place] Please consult [email or web address] for contact info under      Signed, Cecilie Kicks, NP  01/29/2019, 9:16 AM     I have personally seen and examined this patient. I agree with the assessment and plan as outlined above. She is known to have a dilated cardiomyopathy. ICD in place. She also has myelodysplastic syndrome and is admitted now with severe anemia. Following blood transfusion she has become hypoxic with bilateral infiltrates. BNP over 1300. She has not diuresed well with IV Lasix. Co-ox 58 last night. BP is stable. EKG this am with sinus, LBBB. Tele with sinus.  My exam:  General: Well developed, well nourished, NAD  HEENT: OP clear, mucus membranes moist  SKIN: warm, dry. No rashes. Neuro: No focal deficits  Musculoskeletal: Muscle  strength 5/5 all ext  Psychiatric: flat affect Neck: + JVD, no carotid bruits, no thyromegaly, no lymphadenopathy.  Lungs:Rhonic right base.  Cardiovascular: Regular rate and rhythm. No murmurs, gallops or rubs. Abdomen:Soft. Bowel sounds present. Non-tender.  Extremities: No lower extremity edema. Pulses are 2 + in the bilateral DP/PT.   Agree with beginning milrinone today. Continue IV Lasix. If she does not diurese over next 24 hours, consider transfer to [facility] for management by our Advanced Heart Failure team. Will follow with you.   Lauree Chandler 01/29/2019 10:06 AM

## 2019-01-29 NOTE — Progress Notes (Signed)
Name: [name] MRN: [id number] DOB: [birth year]    ADMISSION DATE:  01/22/2019 CONSULTATION DATE:  01/29/2019   REFERRING MD :  Doristine Bosworth, [facility]  CHIEF COMPLAINT:  Hypoxia, resp distress  BRIEF PATIENT DESCRIPTION: 73 year old woman with nonischemic cardiomyopathy, EF 25 to 30% and myelodysplastic syndrome admitted 5/12 with bilateral nodular infiltrates, CO VID negative, respiratory viral panel negative. PCCM consulted for worsening infiltrates and hypoxia 5/17  SIGNIFICANT EVENTS  5/14 1 unit PRBC transfusion  STUDIES:  CT angiogram 5/12 diffuse bilateral patchy groundglass infiltrates-edema versus multifocal pneumonia with enlarged pulmonary artery and trace bilateral effusions  Echo 07/2018 diffuse hypokinesis with EF 25 to 30%, moderate MR    SUBJECTIVE:  Has not diuresed much in spite of Lasix 40 every 8 last 24 hours afebrile Increased oxygen requirements overnight  VITAL SIGNS: Temp:  [97.8 F (36.6 C)-98.5 F (36.9 C)] 98.2 F (36.8 C) (05/18 0649) Pulse Rate:  [80-88] 88 (05/18 0649) Resp:  [13-22] 22 (05/18 0649) BP: (108-121)/(44-55) 114/48 (05/18 0649) SpO2:  [90 %-94 %] 93 % (05/18 0832)  PHYSICAL EXAMINATION: Examined prior to transport to ICU  Gen. Pleasant, obese, in mild  distress, normal affect ENT - no pallor,icterus, no post nasal drip, class 2 airway Neck: Mild JVD, no thyromegaly, no carotid bruits Lungs: no use of accessory muscles, no dullness to percussion, decreased with right basal rales no rhonchi  Cardiovascular: Rhythm regular, heart sounds  normal, no murmurs or gallops, no peripheral edema Abdomen: soft and non-tender, no hepatosplenomegaly, BS normal. Musculoskeletal: No deformities, no cyanosis or clubbing Neuro:  alert, non focal, no tremors    Recent Labs  Lab 01/27/19 0537 01/28/19 0322 01/29/19 0357  NA 138 139 140  K 3.5 3.4* 4.3  CL 85* 83* 85*  CO2 45* 45* 46*  BUN 14 18 26*  CREATININE 0.74 0.74 0.70   GLUCOSE 120* 123* 125*   Recent Labs  Lab 01/27/19 0537 01/28/19 0322 01/29/19 0357  HGB 7.7* 7.8* 7.2*  HCT 24.7* 25.4* 24.0*  WBC 3.8* 4.4 3.7*  PLT 59* 59* 56*   Dg Chest Port 1 View  Result Date: 01/27/2019 CLINICAL DATA:  Decreased oxygen EXAM: PORTABLE CHEST 1 VIEW COMPARISON:  01/26/2019 FINDINGS: Cardiac enlargement with AICD. Diffuse bilateral airspace disease with progression. This is symmetric and could represent edema or pneumonia. No significant pleural effusion. IMPRESSION: Worsening of diffuse bilateral airspace disease which could represent edema or pneumonia. Cardiac enlargement. Electronically Signed   By: Franchot Gallo M.D.   On: 01/27/2019 19:03   [place] Ekg Site Rite  Result Date: 01/28/2019 If Site Rite image not attached, placement could not be confirmed due to current cardiac rhythm.   ASSESSMENT / PLAN:  Acute respiratory failure with hypoxia Bilateral infiltrates of unclear etiology -unlikely to be infectious - COVID testing neg x 2 Differential diagnosis remains acute systolic heart failure with pulmonary edema but surprisingly she has not responded to diuretics. Less likely TRALI - last transfusion was on 5/14.  Would hold off on further transfusions unless hemoglobin less than 7 Bone marrow biopsy was recent, so doubt blast crisis here  Recommend-   BNP remains high, transfer to ICU and start milrinone 0.25 with co-ox monitoring-goal would be 65% and above, if remains low then increase to 0.375 if blood pressure tolerates  Doubt role for steroids here DNR noted    Kara Mead MD. FCCP. [facility] Pager [phone] If no response call [phone]  01/29/2019, 9:28 AM

## 2019-01-29 NOTE — Progress Notes (Signed)
PROGRESS NOTE    [name]  OEV:[id number] DOB: 12/27/1945 DOA: 01/22/2019 PCP: Elby Showers, MD   Brief Narrative:  73 year old female who presented with dyspnea and weakness.  She does have significant past medical history for myelodysplastic syndrome and nonischemic cardiomyopathy systolic dysfunction ejection fraction 25 to 30%.  She was seen by her primary care provider on the day of admission, she was found hypoxic down to 80% on room air and her hemoglobin was down to 6.1, she was placed on supplemental oxygen and transferred to the hospital for further evaluation.  She physical examination blood pressure was 103/35, heart rate 77, respiratory rate 20, temperature 98.4, oxygen saturation 96%, her lungs were showing rhonchi bilaterally, heart S1-S2 present and rhythmic, abdomen was soft, no lower extremity edema.  Sodium 132, potassium 3.6, chloride 83, bicarb 31, glucose 122, BUN 22, creatinine 1.0, white count 3.8, hemoglobin 6.1, hematocrit 19.0, platelets 65.  Urinalysis negative for infection.  Her chest x-ray had bilateral nodular infiltrates.  SARS COVID-19 negative.   Patient was admitted to hospital working diagnosis of acute hypoxic respiratory failure due to multifocal pneumonia and was started on IV Rocephin and Zithromax.  I assumed patient's care on 01/24/2019.  Based on my examination and clinical judgment, patient seem to have acute on chronic systolic congestive heart failure so I started the patient on Lasix 40 mg IV twice daily.  She has no leukocytosis, she is afebrile with unremarkable procalcitonin.  Patient continued to get worse with her respiratory status and requires more and more oxygen with worsening of chest x-ray.  Lasix was increased to 40 mg 3 times daily IV on 5/16 and despite of that there was no improvement with PCCM was consulted and they recommended consulting cardiology for possible need of inotropes.  Cardiology has been on board as well.  Consultants:    PCCM  Procedures:   None  Antimicrobials:   IV Rocephin and Zithromax started on 01/22/2019, stopping azithromycin today on 01/29/2019.   Subjective: Patient seen and examined.  She states that she does not feel any better or worse than yesterday and continues to have dyspnea on exertion.  No new complaint.  Objective: Vitals:   01/28/19 2025 01/28/19 2159 01/29/19 0649 01/29/19 0832  BP:  (!) 108/44 (!) 114/48   Pulse:  80 88   Resp:   (!) 22   Temp:   98.2 F (36.8 C)   TempSrc:   Oral   SpO2: 93%  92% 93%  Weight:      Height:        Intake/Output Summary (Last 24 hours) at 01/29/2019 0847 Last data filed at 01/29/2019 0701 Gross per 24 hour  Intake 360 ml  Output 1050 ml  Net -690 ml   Filed Weights   01/22/19 1242  Weight: 86.6 kg    Examination:  General exam: Appears calm and comfortable  Respiratory system: Coarse breath sounds bilaterally with bilateral crackles in all lobes along with some scant rhonchi bilaterally with diminished breath sounds at the bases. Respiratory effort normal. Cardiovascular system: S1 & S2 heard, RRR. No JVD, murmurs, rubs, gallops or clicks.  +2 pitting edema bilateral lower extremity and +1 pitting edema bilateral upper extremity Gastrointestinal system: Abdomen is nondistended, soft and nontender. No organomegaly or masses felt. Normal bowel sounds heard. Central nervous system: Alert and oriented. No focal neurological deficits. Extremities: Symmetric 5 x 5 power. Skin: No rashes, lesions or ulcers Psychiatry: Judgement and insight appear normal. Mood &  affect appropriate.  Data Reviewed: I have personally reviewed following labs and imaging studies  CBC: Recent Labs  Lab 01/25/19 0516 01/26/19 0516 01/27/19 0537 01/28/19 0322 01/29/19 0357  WBC 3.9* 4.3 3.8* 4.4 3.7*  NEUTROABS 2.4 2.9 2.5 2.8 2.2  HGB 7.0* 8.0* 7.7* 7.8* 7.2*  HCT 22.7* 25.8* 24.7* 25.4* 24.0*  MCV 107.6* 103.6* 102.5* 105.4* 107.6*  PLT 61*  60* 59* 59* 56*   Basic Metabolic Panel: Recent Labs  Lab 01/24/19 1219 01/25/19 0516 01/26/19 1006 01/27/19 0537 01/28/19 0322 01/29/19 0357  NA 139 138 136 138 139 140  K 4.5 4.3 3.4* 3.5 3.4* 4.3  CL 99 94* 88* 85* 83* 85*  CO2 34* 35* 38* 45* 45* 46*  GLUCOSE 128* 121* 156* 120* 123* 125*  BUN 13 13 13 14 18  26*  CREATININE 0.84 0.79 0.80 0.74 0.74 0.70  CALCIUM 8.7* 8.3* 8.1* 8.1* 8.1* 8.0*  MG 1.7 1.6* 1.5*  --   --   --    GFR: Estimated Creatinine Clearance: 60.8 mL/min (by C-G formula based on SCr of 0.7 mg/dL). Liver Function Tests: Recent Labs  Lab 01/22/19 1247 01/24/19 1219 01/25/19 0516 01/26/19 1006 01/27/19 0537  AST 11* 10* 9* 9* 9*  ALT 21 16 14 13 12   ALKPHOS 55 48 40 42 42  BILITOT 2.0* 1.0 1.3* 1.8* 1.9*  PROT 6.1* 5.8* 5.5* 5.9* 5.8*  ALBUMIN 3.1* 2.9* 2.7* 2.9* 2.8*   No results for input(s): LIPASE, AMYLASE in the last 168 hours. No results for input(s): AMMONIA in the last 168 hours. Coagulation Profile: No results for input(s): INR, PROTIME in the last 168 hours. Cardiac Enzymes: Recent Labs  Lab 01/22/19 1258  TROPONINI <0.03   BNP (last 3 results) No results for input(s): PROBNP in the last 8760 hours. HbA1C: No results for input(s): HGBA1C in the last 72 hours. CBG: No results for input(s): GLUCAP in the last 168 hours. Lipid Profile: No results for input(s): CHOL, HDL, LDLCALC, TRIG, CHOLHDL, LDLDIRECT in the last 72 hours. Thyroid Function Tests: No results for input(s): TSH, T4TOTAL, FREET4, T3FREE, THYROIDAB in the last 72 hours. Anemia Panel: No results for input(s): VITAMINB12, FOLATE, FERRITIN, TIBC, IRON, RETICCTPCT in the last 72 hours. Sepsis Labs: Recent Labs  Lab 01/22/19 1246 01/22/19 2236 01/24/19 1219  PROCALCITON  --   --  <0.10  LATICACIDVEN 1.4 0.9  --     Recent Results (from the past 240 hour(s))  Culture, blood (routine x 2)     Status: None   Collection Time: 01/22/19 12:51 PM  Result Value Ref  Range Status   Specimen Description   Final    BLOOD RIGHT ANTECUBITAL Performed at [facility], [place] [address] [place], [place] [address]    Special Requests   Final    BOTTLES DRAWN AEROBIC AND ANAEROBIC Blood Culture results may not be optimal due to an excessive volume of blood received in culture bottles Performed at [place] [address], [place], [place] [address]    Culture   Final    NO GROWTH 5 DAYS Performed at [facility], [place] [address], [place], [place] [address]    Report Status 01/27/2019 FINAL  Final  Culture, blood (routine x 2)     Status: None   Collection Time: 01/22/19 12:56 PM  Result Value Ref Range Status   Specimen Description   Final    BLOOD RIGHT ANTECUBITAL Performed at [facility], [address] [place], [place] [address]    Special Requests   Final    BOTTLES DRAWN AEROBIC AND ANAEROBIC Blood Culture adequate volume Performed at [place] [address] [place], [place] [address]    Culture   Final    NO GROWTH 5 DAYS Performed at [facility], [place] [address] [place], [place] [address]    Report Status 01/27/2019 FINAL  Final  SARS Coronavirus 2 (CEPHEID- Performed in [facility] hospital lab), Hosp Order     Status: None   Collection Time: 01/22/19 12:59 PM  Result Value Ref Range Status   SARS Coronavirus 2 NEGATIVE NEGATIVE Final    Comment: (NOTE) If result is NEGATIVE SARS-CoV-2 target nucleic acids are NOT DETECTED. The SARS-CoV-2 RNA is generally detectable in upper and lower  respiratory specimens during the acute phase of infection. The lowest  concentration of SARS-CoV-2 viral copies this assay can detect is 250  copies / mL. A negative result does not preclude SARS-CoV-2 infection  and should not be used as the sole basis for treatment or other  patient management decisions.  A negative result may occur with  improper specimen collection / handling,  submission of specimen other  than nasopharyngeal swab, presence of viral mutation(s) within the  areas targeted by this assay, and inadequate number of viral copies  (<250 copies / mL). A negative result must be combined with clinical  observations, patient history, and epidemiological information. If result is POSITIVE SARS-CoV-2 target nucleic acids are DETECTED. The SARS-CoV-2 RNA is generally detectable in upper and lower  respiratory specimens dur ing the acute phase of infection.  Positive  results are indicative of active infection with SARS-CoV-2.  Clinical  correlation with patient history and other diagnostic information is  necessary to determine patient infection status.  Positive results do  not rule out bacterial infection or co-infection with other viruses. If result is PRESUMPTIVE POSTIVE SARS-CoV-2 nucleic acids MAY BE PRESENT.   A presumptive positive result was obtained on the submitted specimen  and confirmed on repeat testing.  While 2019 novel coronavirus  (SARS-CoV-2) nucleic acids may be present in the submitted sample  additional confirmatory testing may be necessary for epidemiological  and / or clinical management purposes  to differentiate between  SARS-CoV-2 and other Sarbecovirus currently known to infect humans.  If clinically indicated additional testing with an alternate test  methodology [phone]) is advised. The SARS-CoV-2 RNA is generally  detectable in upper and lower respiratory sp ecimens during the acute  phase of infection. The expected result is Negative. Fact Sheet for Patients:  StrictlyIdeas.no Fact Sheet for Healthcare Providers: BankingDealers.co.za This test is not yet approved or cleared by the [place] FDA and has been authorized for detection and/or diagnosis of SARS-CoV-2 by FDA under an Emergency Use Authorization (EUA).  This EUA will remain in effect (meaning this test can be  used) for the duration of the COVID-19 declaration under Section 564(b)(1) of the Act, 21 U.S.C. section 360bbb-3(b)(1), unless the authorization is terminated or revoked sooner. Performed at [facility], [place] [address] [place], [place] [address]   Respiratory Panel by PCR     Status: None   Collection Time: 01/25/19  1:12 PM  Result Value Ref Range Status   Adenovirus NOT DETECTED NOT DETECTED Final   Coronavirus 229E NOT DETECTED NOT DETECTED Final    Comment: (NOTE) The Coronavirus on the Respiratory Panel, DOES NOT test for  the novel  Coronavirus (2019 nCoV)    Coronavirus HKU1 NOT DETECTED NOT DETECTED Final   Coronavirus NL63 NOT DETECTED NOT DETECTED Final   Coronavirus OC43 NOT DETECTED NOT DETECTED Final   Metapneumovirus NOT DETECTED NOT DETECTED Final   Rhinovirus / Enterovirus NOT DETECTED NOT DETECTED Final   Influenza A NOT DETECTED NOT DETECTED Final   Influenza B NOT DETECTED NOT DETECTED Final   Parainfluenza Virus 1 NOT DETECTED NOT DETECTED Final   Parainfluenza Virus 2 NOT DETECTED NOT DETECTED Final   Parainfluenza Virus 3 NOT DETECTED NOT DETECTED Final   Parainfluenza Virus 4 NOT DETECTED NOT DETECTED Final   Respiratory Syncytial Virus NOT DETECTED NOT DETECTED Final   Bordetella pertussis NOT DETECTED NOT DETECTED Final   Chlamydophila pneumoniae NOT DETECTED NOT DETECTED Final   Mycoplasma pneumoniae NOT DETECTED NOT DETECTED Final    Comment: Performed at [facility], [place] [address], [place], [place] [address]  SARS Coronavirus 2 (CEPHEID- Performed in [facility] [place] hospital lab), Hosp Order     Status: None   Collection Time: 01/28/19  7:42 AM  Result Value Ref Range Status   SARS Coronavirus 2 NEGATIVE NEGATIVE Final    Comment: (NOTE) If result is NEGATIVE SARS-CoV-2 target nucleic acids are NOT DETECTED. The SARS-CoV-2 RNA is generally detectable in upper and lower  respiratory specimens during the acute phase of  infection. The lowest  concentration of SARS-CoV-2 viral copies this assay can detect is 250  copies / mL. A negative result does not preclude SARS-CoV-2 infection  and should not be used as the sole basis for treatment or other  patient management decisions.  A negative result may occur with  improper specimen collection / handling, submission of specimen other  than nasopharyngeal swab, presence of viral mutation(s) within the  areas targeted by this assay, and inadequate number of viral copies  (<250 copies / mL). A negative result must be combined with clinical  observations, patient history, and epidemiological information. If result is POSITIVE SARS-CoV-2 target nucleic acids are DETECTED. The SARS-CoV-2 RNA is generally detectable in upper and lower  respiratory specimens dur ing the acute phase of infection.  Positive  results are indicative of active infection with SARS-CoV-2.  Clinical  correlation with patient history and other diagnostic information is  necessary to determine patient infection status.  Positive results do  not rule out bacterial infection or co-infection with other viruses. If result is PRESUMPTIVE POSTIVE SARS-CoV-2 nucleic acids MAY BE PRESENT.   A presumptive positive result was obtained on the submitted specimen  and confirmed on repeat testing.  While 2019 novel coronavirus  (SARS-CoV-2) nucleic acids may be present in the submitted sample  additional confirmatory testing may be necessary for epidemiological  and / or clinical management purposes  to differentiate between  SARS-CoV-2 and other Sarbecovirus currently known to infect humans.  If clinically indicated additional testing with an alternate test  methodology [phone]) is advised. The SARS-CoV-2 RNA is generally  detectable in upper and lower respiratory sp ecimens during the acute  phase of infection. The expected result is Negative. Fact Sheet for Patients:   StrictlyIdeas.no Fact Sheet for Healthcare Providers: BankingDealers.co.za This test is not yet approved or cleared by the [place] FDA and has been authorized for detection and/or diagnosis of SARS-CoV-2 by FDA under an Emergency Use Authorization (EUA).  This EUA will remain in effect (meaning this test can be used) for the duration of the COVID-19 declaration under  Section 564(b)(1) of the Act, 21 U.S.C. section 360bbb-3(b)(1), unless the authorization is terminated or revoked sooner. Performed at [facility], [place] [address], [place], [place] [address]       Radiology Studies: Dg Chest Port 1 View  Result Date: 01/27/2019 CLINICAL DATA:  Decreased oxygen EXAM: PORTABLE CHEST 1 VIEW COMPARISON:  01/26/2019 FINDINGS: Cardiac enlargement with AICD. Diffuse bilateral airspace disease with progression. This is symmetric and could represent edema or pneumonia. No significant pleural effusion. IMPRESSION: Worsening of diffuse bilateral airspace disease which could represent edema or pneumonia. Cardiac enlargement. Electronically Signed   By: Franchot Gallo M.D.   On: 01/27/2019 19:03   [place] Ekg Site Rite  Result Date: 01/28/2019 If Site Rite image not attached, placement could not be confirmed due to current cardiac rhythm.   Scheduled Meds: . azithromycin  500 mg Oral Q24H  . carvedilol  25 mg Oral BID WC  . docusate sodium  100 mg Oral BID  . enoxaparin (LOVENOX) injection  40 mg Subcutaneous Q24H  . folic acid  1 mg Oral Daily  . furosemide  40 mg Intravenous TID PC  . ipratropium-albuterol  3 mL Nebulization BID  . levothyroxine  150 mcg Oral Q0600  . losartan  25 mg Oral Daily  . mouth rinse  15 mL Mouth Rinse BID  . pantoprazole  40 mg Oral Daily  . polyethylene glycol  17 g Oral Daily  . sodium chloride flush  10-40 mL Intracatheter Q12H  . sodium chloride flush  3 mL Intravenous Once  . vitamin B-12  50  mcg Oral Daily   Continuous Infusions: . sodium chloride Stopped (01/22/19 1935)  . cefTRIAXone (ROCEPHIN)  IV Stopped (01/28/19 1602)     LOS: 7 days   Assessment & Plan:   Principal Problem:   Acute respiratory failure with hypoxia (HCC) Active Problems:   Hypothyroidism   Essential hypertension   Symptomatic anemia   MDS (myelodysplastic syndrome) (HCC)   Multifocal pneumonia   Respiratory failure (HCC)   Dyspnea   Congestive dilated cardiomyopathy (HCC)  #1 Acute hypoxic respiratory failure secondary to multifocal pneumonia versus acute on chronic systolic congestive heart failure: Patient has been tested negative for COVID 19.  She remains afebrile with no leukocytosis. Unremarkable procalcitonin.  Does not feel any different than yesterday.  Oxygen requirement has been increased and currently on 10 L with chest x-ray repeated last night shows worsening opacity bilaterally.  Continues to be on Lasix 40 mg 3 times daily.  Repeat COVID-19 yesterday is negative as well.  Now has elevated BNP.  May need milrinone therapy but we will defer that to cardiology.  At this point in time due to increasing oxygen demands, I am going to transfer to ICU and started on BiPAP.  I also talked to Dr. Elsworth Soho from [facility] personally will come to see patient soon.   #2 acute on chronic anemia secondary to MDS hemoglobin 6.1 at the time of admission, she has received 2 units of PRBC transfusion.  Hemoglobin improved and then dropped to 7.0 on 01/25/2019 and she received another unit of transfusion Per oncology recommendations.  Hemoglobin is stable but slowly dropping and currently 7.3.  #3 hypertension: Blood pressure controlled.  Continue carvedilol and continue to hold Cozaar.  #4 hypothyroidism continue Synthroid.  #5 hypokalemia: Resolved.  DVT prophylaxis: Lovenox Code Status: Full code Family Communication: Plan discussed with the patient.  No family present.  She has no family member living.  Disposition Plan: To be determined pending clinical improvement.   Time spent: 35 minutes   Darliss Cheney, MD [facility] Pager [phone]  If 7PM-7AM, please contact night-coverage [email or web address] Password [facility] 01/29/2019, 8:47 AM

## 2019-01-29 NOTE — Progress Notes (Signed)
Updated patient's primary contact, Michaele Offer, regarding the transfer of patient to SDU.

## 2019-01-29 NOTE — Progress Notes (Signed)
Notified hospitalist [place] regarding the updated status of the patient and her progress overnight. Patient is on 10L HFNC, lethargic with coarse crackles. Patient 02 desats to 85% when standing for BSC. Given verbal orders to transfer patient to SDU at [facility] and to order [place]. RT was notified of MD's wishes.

## 2019-01-29 NOTE — Progress Notes (Signed)
Patient taken off BiPAP at this time. RT will continue to monitor patient.

## 2019-01-29 NOTE — Progress Notes (Signed)
Pt was placed on Bipap, Blood Pressure resulted with systolic in 53Y-051T and MAP in low 50s. MD Elsworth Soho was paged and he said he was fine with systolic >02, and to hold 1400 dose of lasix. And gave verbal order to put a foley in if need be. Will continue to monitor.

## 2019-01-29 NOTE — Progress Notes (Signed)
PT Cancellation Note  Patient Details Name: [name] MRN: [id number] DOB: [birth year]   Cancelled Treatment:     Chart reviewed . Noted pt transferred to SDU this morning for bipap, will hold therapy session today.    Clide Dales 01/29/2019, 9:48 AM  Clide Dales, PT Acute Rehabilitation Services Pager: [phone] Office: [phone] 01/29/2019

## 2019-01-29 NOTE — Progress Notes (Signed)
Pt hard to arouse, sats 92%. Pt with COPD on 15L HFNC, using accessory muscles. RT consulted, will assess for BIPAP needs. Hoyle Barr, RN

## 2019-01-30 ENCOUNTER — Inpatient Hospital Stay: Payer: Medicare Other

## 2019-01-30 LAB — BASIC METABOLIC PANEL
Anion gap: 9 (ref 5–15)
BUN: 26 mg/dL — ABNORMAL HIGH (ref 8–23)
CO2: 49 mmol/L — ABNORMAL HIGH (ref 22–32)
Calcium: 8.1 mg/dL — ABNORMAL LOW (ref 8.9–10.3)
Chloride: 85 mmol/L — ABNORMAL LOW (ref 98–111)
Creatinine, Ser: 0.75 mg/dL (ref 0.44–1.00)
GFR calc Af Amer: >60 mL/min (ref >60)
GFR calc non Af Amer: >60 mL/min (ref >60)
Glucose, Bld: 129 mg/dL — ABNORMAL HIGH (ref 70–99)
Potassium: 4.2 mmol/L (ref 3.5–5.1)
Sodium: 143 mmol/L (ref 135–145)

## 2019-01-30 LAB — CBC WITH DIFFERENTIAL/PLATELET
Abs Immature Granulocytes: 0.16 10*3/uL — ABNORMAL HIGH (ref 0.00–0.07)
Basophils Absolute: 0 10*3/uL (ref 0.0–0.1)
Basophils Relative: 0 %
Eosinophils Absolute: 0 10*3/uL (ref 0.0–0.5)
Eosinophils Relative: 0 %
HCT: 23.8 % — ABNORMAL LOW (ref 36.0–46.0)
Hemoglobin: 7 g/dL — ABNORMAL LOW (ref 12.0–15.0)
Immature Granulocytes: 3 %
Lymphocytes Relative: 13 %
Lymphs Abs: 0.7 10*3/uL (ref 0.7–4.0)
MCH: 31.5 pg (ref 26.0–34.0)
MCHC: 29.4 g/dL — ABNORMAL LOW (ref 30.0–36.0)
MCV: 107.2 fL — ABNORMAL HIGH (ref 80.0–100.0)
Monocytes Absolute: 1.1 10*3/uL — ABNORMAL HIGH (ref 0.1–1.0)
Monocytes Relative: 20 %
Neutro Abs: 3.4 10*3/uL (ref 1.7–7.7)
Neutrophils Relative %: 64 %
Platelets: 57 10*3/uL — ABNORMAL LOW (ref 150–400)
RBC: 2.22 MIL/uL — ABNORMAL LOW (ref 3.87–5.11)
RDW: 18.4 % — ABNORMAL HIGH (ref 11.5–15.5)
WBC: 5.4 10*3/uL (ref 4.0–10.5)
nRBC: 10.3 % — ABNORMAL HIGH (ref 0.0–0.2)

## 2019-01-30 LAB — COOXEMETRY PANEL
Carboxyhemoglobin: 2.7 % — ABNORMAL HIGH (ref 0.5–1.5)
Methemoglobin: 0.9 % (ref 0.0–1.5)
O2 Saturation: 75.6 %
Total hemoglobin: 7.3 g/dL — ABNORMAL LOW (ref 12.0–16.0)

## 2019-01-30 LAB — HEMOGLOBIN AND HEMATOCRIT, BLOOD
HCT: 22.6 % — ABNORMAL LOW (ref 36.0–46.0)
Hemoglobin: 6.7 g/dL — CL (ref 12.0–15.0)

## 2019-01-30 LAB — SEDIMENTATION RATE: Sed Rate: 109 mm/hr — ABNORMAL HIGH (ref 0–22)

## 2019-01-30 LAB — PREPARE RBC (CROSSMATCH)

## 2019-01-30 LAB — C-REACTIVE PROTEIN: CRP: 20.9 mg/dL — ABNORMAL HIGH (ref <1.0)

## 2019-01-30 MED ORDER — SODIUM CHLORIDE 0.9% IV SOLUTION: Freq: Once | INTRAVENOUS | Status: DC

## 2019-01-30 MED ORDER — MAGNESIUM SULFATE 2 GM/50ML IV SOLN
2.0000 g | Freq: Once | INTRAVENOUS | Status: AC
  Administered 2019-01-30: 14:00:00 2 g via INTRAVENOUS
  Filled 2019-01-30: qty 50

## 2019-01-30 MED ORDER — METHYLPREDNISOLONE SODIUM SUCC 125 MG IJ SOLR
60.0000 mg | Freq: Four times a day (QID) | INTRAMUSCULAR | Status: DC
  Administered 2019-01-30 – 2019-02-05 (×24): 60 mg via INTRAVENOUS
  Filled 2019-01-30 (×25): qty 2

## 2019-01-30 MED ORDER — ACETAZOLAMIDE SODIUM 500 MG IJ SOLR
250.0000 mg | Freq: Two times a day (BID) | INTRAMUSCULAR | Status: AC
  Administered 2019-01-30 (×2): 250 mg via INTRAVENOUS
  Filled 2019-01-30 (×2): qty 250

## 2019-01-30 NOTE — Progress Notes (Addendum)
Progress Note  Due to the COVID-19 pandemic, this NP visit was completed with telemedicine (audio/video) technology to reduce patient and provider exposure as well as to preserve personal protective equipment.  MD visit in person in room  Patient Name: [name] Date of Encounter: 01/30/2019  Primary Cardiologist: [name], MD   Subjective   During night off BiPap pt desat to 65, on 15 HL 02 and sat to 85.  Placed on non rebreather.   Inpatient Medications    Scheduled Meds: . carvedilol  25 mg Oral BID WC  . Chlorhexidine Gluconate Cloth  6 each Topical Q0600  . docusate sodium  100 mg Oral BID  . enoxaparin (LOVENOX) injection  40 mg Subcutaneous Q24H  . folic acid  1 mg Oral Daily  . furosemide  40 mg Intravenous TID PC  . ipratropium-albuterol  3 mL Nebulization BID  . levothyroxine  150 mcg Oral Q0600  . losartan  25 mg Oral Daily  . mouth rinse  15 mL Mouth Rinse BID  . mupirocin ointment  1 application Nasal BID  . pantoprazole  40 mg Oral Daily  . polyethylene glycol  17 g Oral Daily  . sodium chloride flush  10-40 mL Intracatheter Q12H  . sodium chloride flush  3 mL Intravenous Once  . vitamin B-12  50 mcg Oral Daily   Continuous Infusions: . cefTRIAXone (ROCEPHIN)  IV Stopped (01/29/19 1612)  . milrinone 0.25 mcg/kg/min (01/29/19 2212)   PRN Meds: albuterol, heparin lock flush, heparin lock flush, polyethylene glycol, prochlorperazine   Vital Signs    Vitals:   01/30/19 0448 01/30/19 0500 01/30/19 0600 01/30/19 0704  BP:   (!) 119/48   Pulse: 87  87   Resp: (!) 27  (!) 22   Temp:    99.3 F (37.4 C)  TempSrc:    Oral  SpO2: 90%  95%   Weight:  85 kg    Height:        Intake/Output Summary (Last 24 hours) at 01/30/2019 0741 Last data filed at 01/30/2019 0600 Gross per 24 hour  Intake 334.75 ml  Output 1350 ml  Net -1015.25 ml   Last 3 Weights 01/30/2019 01/29/2019 01/22/2019  Weight (lbs) 187 lb 6.3 oz 193 lb 2 oz 191 lb  Weight (kg)  85 kg 87.6 kg 86.637 kg      Telemetry    SVT at 160 short burst otherwise SR  - Personally Reviewed  ECG    SR with LBBB - Personally Reviewed  Physical Exam     See MD exam below  Labs    Chemistry Recent Labs  Lab 01/25/19 0516 01/26/19 1006 01/27/19 0537 01/28/19 0322 01/29/19 0357 01/30/19 0439  NA 138 136 138 139 140 143  K 4.3 3.4* 3.5 3.4* 4.3 4.2  CL 94* 88* 85* 83* 85* 85*  CO2 35* 38* 45* 45* 46* 49*  GLUCOSE 121* 156* 120* 123* 125* 129*  BUN 13 13 14 18  26* 26*  CREATININE 0.79 0.80 0.74 0.74 0.70 0.75  CALCIUM 8.3* 8.1* 8.1* 8.1* 8.0* 8.1*  PROT 5.5* 5.9* 5.8*  --   --   --   ALBUMIN 2.7* 2.9* 2.8*  --   --   --   AST 9* 9* 9*  --   --   --   ALT 14 13 12   --   --   --   ALKPHOS 40 42 42  --   --   --  BILITOT 1.3* 1.8* 1.9*  --   --   --   GFRNONAA >60 >60 >60 >60 >60 >60  GFRAA >60 >60 >60 >60 >60 >60  ANIONGAP 9 10 8 11 9 9      Hematology Recent Labs  Lab 01/28/19 0322 01/29/19 0357 01/30/19 0439  WBC 4.4 3.7* 5.4  RBC 2.41* 2.23* 2.22*  HGB 7.8* 7.2* 7.0*  HCT 25.4* 24.0* 23.8*  MCV 105.4* 107.6* 107.2*  MCH 32.4 32.3 31.5  MCHC 30.7 30.0 29.4*  RDW 19.0* 19.1* 18.4*  PLT 59* 56* 57*    Cardiac EnzymesNo results for input(s): TROPONINI in the last 168 hours. No results for input(s): TROPIPOC in the last 168 hours.   BNP Recent Labs  Lab 01/24/19 1219 01/28/19 0322 01/28/19 1100  BNP 1,387.4* 1,441.5* 1,421.8*     DDimer No results for input(s): DDIMER in the last 168 hours.   Radiology    [place] Ekg Site Rite  Result Date: 01/28/2019 If [facility] image not attached, placement could not be confirmed due to current cardiac rhythm.   Cardiac Studies   Echo 01/28/19 IMPRESSIONS   1. The left ventricle has severely reduced systolic function, with an ejection fraction of 20-25%. The cavity size was severely dilated. Left ventricular diastolic Doppler parameters are consistent with pseudonormalization. Elevated left  ventricular  end-diastolic pressure Left ventricular diffuse hypokinesis. No evidence of LV thrombus by definity contrast study. 2. The right ventricle has normal systolic function. The cavity was normal. There is no increase in right ventricular wall thickness. Right ventricular systolic pressure is moderately elevated with an estimated pressure of 54.9 mmHg. 3. Left atrial size was moderately dilated. 4. There is mild mitral annular calcification present. Mitral valve regurgitation is mild by color flow Doppler. 5. The aortic valve is tricuspid. There is trivial AR. 6. The inferior vena cava was dilated in size with <50% respiratory variability.  FINDINGS Left Ventricle: The left ventricle has severely reduced systolic function, with an ejection fraction of 20-25%. The cavity size was severely dilated. There is no increase in left ventricular wall thickness. Left ventricular diastolic Doppler parameters  are consistent with pseudonormalization. Elevated left ventricular end-diastolic pressure Left ventricular diffuse hypokinesis. Definity contrast agent was given IV to delineate the left ventricular endocardial borders.  Right Ventricle: The right ventricle has normal systolic function. The cavity was normal. There is no increase in right ventricular wall thickness. Right ventricular systolic pressure is moderately elevated with an estimated pressure of 54.9 mmHg. Pacing wire/catheter visualized in the right ventricle.  Left Atrium: Left atrial size was moderately dilated.  Right Atrium: Right atrial size was normal in size. Right atrial pressure is estimated at 15 mmHg.  Interatrial Septum: No atrial level shunt detected by color flow Doppler.  Pericardium: There is no evidence of pericardial effusion.  Mitral Valve: The mitral valve is normal in structure. There is mild mitral annular calcification present. Mitral valve regurgitation is mild by color flow Doppler.   Tricuspid Valve: The tricuspid valve is normal in structure. Tricuspid valve regurgitation is mild by color flow Doppler.  Aortic Valve: The aortic valve is tricuspid Aortic valve regurgitation was not visualized by color flow Doppler.  Pulmonic Valve: The pulmonic valve was normal in structure. Pulmonic valve regurgitation is not visualized by color flow Doppler.  Venous: The inferior vena cava measures 2.10 cm, is dilated in size with less than 50% respiratory variability.    Patient Profile     73 y.o. female admitted  5/11 with severe anemia and hypoxia. 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 Echo 07/17/18 EF 35-30% She has AICD in place with no recent d/c . After transfusion has had increasing dyspnea with CXR showing worsening diffuse bilateral airspace disease CT suggested multi focal pneumonia vs pulmonary edema ground glass appearance. No PE She has not responded well to diuresis BNP is pending K 3.4 Cr 0.74 ARB held on admission On coreg     Assessment & Plan    Acute on chronic systolic HF.  EF 2019 at 30-35% and now EF 20-25% --BNP 14218 on 01/28/19 --Lasix 40 mg TID IV --I&O neg 1215 last 24 hours and neg 4089 since admit  --wt yesterday 192.7lbs today 187 lbs..   --Cr 0.75   --Co-ox 59, which per Dr. Kyla Balzarine note should be high in setting of anemia.  today 75.  --yesterday with increased hypoxia - BiPAP added,  Milrinone.added.  Transfer to [facility] for HF management. Lethargy last night and now on   --on coreg 25 BID and losartan 25 mg once daily  Acute anemia hgb 6.1 on admit  --rec'd 2 units PRBCs. --today 7.0  hgb yesterday  7.2   Thrombocytopenia today 57K yesterday plts 56K (65K on admit)  Hx of AICD with no shocks. Last visit with Dr. Lovena Le he did not believe she was candidate for up grade with her co morbidities.  --pt is DNR    Respiratory failure with  hypoxia and PNA  Per IM and could be ARDS with leaky capillary syndrome  -- now with decreasing oxygen and need for BiPAP,  --lethargy with Hi 02 so now on non re-breather --CCM to see   NSVT of 5 beats Sunday   HTN pt is on coreg 25 BID, BP has been to 75/16 now 119/48  Cephid test for COVID neg on the 11th.   And again on 01/28/19. Afebrile    MDS per IM (myelodysplastic syndrome)  hypomagnesium on admit at 1.5 will recheck. Would replete   Pt is DNR           For questions or updates, please contact [place] Please consult [email or web address] for contact info under        Signed, Cecilie Kicks, NP  01/30/2019, 7:41 AM     I have personally seen and examined this patient. I agree with the assessment and plan as outlined above. She is known to have a dilated cardiomyopathy with LVEF=25%. ICD in place. She also has myelodysplastic syndrome and is admitted now with severe anemia. Following blood transfusion she has become hypoxic with bilateral infiltrates. Question of pulmonary edema vs transfusion related lung injury. Milrinone drip started on 01/29/19 and IV lasix continued. She is currently not felt to have an infectious process. She has diuresed some with IV Lasix and milrinone but with little clinical improvement. She is down 2.6 kg and 1 liter over last 24 hours but worsened hypoxia overnight, now on bipap. Co-ox 75 this morning. Hgb down to 7.   My exam:  General: Well developed, well nourished, wearing bipap. Lethargic/somnolent.   HEENT: OP clear, mucus membranes moist  SKIN: warm, dry. No rashes. Neuro: No focal deficits  Musculoskeletal: Muscle strength 5/5 all ext  Psychiatric: Mood and affect normal  Neck: No JVD, no carotid bruits, no thyromegaly, no lymphadenopathy.  Lungs:Clear bilaterally, no wheezes, rhonci, crackles Cardiovascular: Regular rate and rhythm. No  murmurs, gallops or rubs. Abdomen:Soft. Bowel sounds present. Non-tender.  Extremities: No lower  extremity edema. Pulses are 2 + in the bilateral DP/PT.  Plan: Still remains hypoxic despite diuresis. Continue milrinone drip and IV Lasix today. If she does not improve with diuresis then would have to consider even more strongly transfusion related lung injury, process related to her MDS or other primary lung issue. Discussed with the Critical Care team this am.     Lauree Chandler  01/30/2019 8:51 AM

## 2019-01-30 NOTE — Progress Notes (Signed)
CRITICAL VALUE ALERT  Critical Value:  Hgb 6.7  Date & Time Notied:  5/19  18/42  Provider Notified: Doristine Bosworth MD (paged)  Orders Received/Actions taken: awaiting response

## 2019-01-30 NOTE — Progress Notes (Signed)
RT and nurse has attempted to place Pt on a 15 L Salter() and the Pt isnt able to maintain her SATS. The Pt's a severe CO2 retainer. The BIPAP seems to be the best treatment for at this time. RT will coontinue to monitor.

## 2019-01-30 NOTE — Progress Notes (Signed)
RT has lowered the Pt's FIO2 to 55% and when the Pt is deep asleep her SATS drops to 87 %. RT will continue to monitor

## 2019-01-30 NOTE — Progress Notes (Signed)
   Name: [name] MRN: [id number] DOB: [birth year]    ADMISSION DATE:  01/22/2019 CONSULTATION DATE:  01/30/2019   REFERRING MD :  Doristine Bosworth, triad  CHIEF COMPLAINT:  Hypoxia, resp distress  BRIEF PATIENT DESCRIPTION: 73 year old woman with nonischemic cardiomyopathy, EF 25 to 30% and myelodysplastic syndrome admitted 5/12 with bilateral nodular infiltrates, COVID negative, respiratory viral panel negative. PCCM consulted for worsening infiltrates and hypoxia 5/17  SIGNIFICANT EVENTS  5/11 Admit to [facility]  5/14 PRBC transfusion x 1 5/19 PCCM consulted for nodular infiltrates  STUDIES:  ECHO 07/2018 >> diffuse hypokinesis with EF 25 to 30%, moderate MR  CTA Chest 5/12 >> diffuse bilateral patchy groundglass infiltrates-edema versus multifocal pneumonia with enlarged pulmonary artery and trace bilateral effusions  SUBJECTIVE:  RN reports pt more sleepy this am. Now on BiPAP.  I/O - 1.5L UOP in last 24 hours, 1.2 negative for last 24h, 3.9 negative for admit  VITAL SIGNS: Temp:  [97.6 F (36.4 C)-99.7 F (37.6 C)] 99.7 F (37.6 C) (05/19 0800) Pulse Rate:  [73-111] 96 (05/19 0815) Resp:  [19-27] 25 (05/19 0815) BP: (97-134)/(36-63) 119/48 (05/19 0600) SpO2:  [88 %-96 %] 91 % (05/19 0815) FiO2 (%):  [0 %-75 %] 60 % (05/19 0821) Weight:  [85 kg] 85 kg (05/19 0500)  PHYSICAL EXAMINATION: General: elderly female lying in bed on bipap in NAD HEENT: MM pink/dry, bipap mask in place Neuro: Awakens, answers simple questions then drifts back to sleep  CV: s1s2 rrr, no m/r/g PULM: even/non-labored, lungs bilaterally with crackles  AY:TKZS, bsx4 active  Extremities: warm/dry, trace edema  Skin: no rashes or lesions  Recent Labs  Lab 01/28/19 0322 01/29/19 0357 01/30/19 0439  NA 139 140 143  K 3.4* 4.3 4.2  CL 83* 85* 85*  CO2 45* 46* 49*  BUN 18 26* 26*  CREATININE 0.74 0.70 0.75  GLUCOSE 123* 125* 129*   Recent Labs  Lab 01/28/19 0322 01/29/19 0357 01/30/19 0439   HGB 7.8* 7.2* 7.0*  HCT 25.4* 24.0* 23.8*  WBC 4.4 3.7* 5.4  PLT 59* 56* 57*   [place] Ekg Site Rite  Result Date: 01/28/2019 If Site Rite image not attached, placement could not be confirmed due to current cardiac rhythm.   ASSESSMENT / PLAN:  Acute Hypoxemic Respiratory Failure  Bilateral infiltrates - of unclear etiology, unlikely to be infectious >, PCT negative, COVID testing neg x 2. Differential diagnosis remains acute systolic heart failure with pulmonary edema but surprisingly she has not responded to diuretics.  Less likely TRALI - last transfusion was on 5/14.  Would hold off on further transfusions unless hemoglobin less than 7.  ? Related to MDS even though recent BM biopsy did not suggest crisis. Also, must consider Vidaza (can cause pneumonitis, ILD).    Plan: Continue BiPAP PRN  DNR  Continue Milrinone / lasix for now Monitor CO-Ox Appreciate Cardiology input  Assess ESR, CRP, ANA, DSDNA, ANCA, GBM, RF, CCP  Solumedrol 60 mg IV Q6 (to start after lab draw)   Suspected Contraction Alkalosis  -CO2 on BMP has risen from 31 to 49 P: Diamox 250 mg IV BID x2 doses    Noe Gens, NP-C [facility] Pgr: [phone] or if no answer [phone] 01/30/2019, 10:31 AM

## 2019-01-30 NOTE — Progress Notes (Signed)
PT Cancellation Note  Patient Details Name: [name] MRN: [id number] DOB: 1946-08-29   Cancelled Treatment:    Reason Eval/Treat Not Completed: Medical issues which prohibited therapy. Pt transferred to ICU on yesterday. Now on BIPAP. Will hold PT today and check back another day.    Weston Anna, PT Acute Rehabilitation Services Pager: [phone] Office: [phone]

## 2019-01-30 NOTE — Progress Notes (Signed)
PROGRESS NOTE    [name]  VQM:[id number] DOB: 02/10/1946 DOA: 01/22/2019 PCP: Elby Showers, MD   Brief Narrative:  73 year old female who presented with dyspnea and weakness.  She does have significant past medical history for myelodysplastic syndrome and nonischemic cardiomyopathy systolic dysfunction ejection fraction 25 to 30%.  She was seen by her primary care provider on the day of admission, she was found hypoxic down to 80% on room air and her hemoglobin was down to 6.1, she was placed on supplemental oxygen and transferred to the hospital for further evaluation.  She physical examination blood pressure was 103/35, heart rate 77, respiratory rate 20, temperature 98.4, oxygen saturation 96%, her lungs were showing rhonchi bilaterally, heart S1-S2 present and rhythmic, abdomen was soft, no lower extremity edema.  Sodium 132, potassium 3.6, chloride 83, bicarb 31, glucose 122, BUN 22, creatinine 1.0, white count 3.8, hemoglobin 6.1, hematocrit 19.0, platelets 65.  Urinalysis negative for infection.  Her chest x-ray had bilateral nodular infiltrates.  SARS COVID-19 negative.   Patient was admitted to hospital working diagnosis of acute hypoxic respiratory failure due to multifocal pneumonia and was started on IV Rocephin and Zithromax.  I assumed patient's care on 01/24/2019.  Based on my examination and clinical judgment, patient seem to have acute on chronic systolic congestive heart failure so I started the patient on Lasix 40 mg IV twice daily.  She has no leukocytosis, she is afebrile with unremarkable procalcitonin.  Patient continued to get worse with her respiratory status and requires more and more oxygen with worsening of chest x-ray.  Lasix was increased to 40 mg 3 times daily IV on 5/16 and despite of that there was no improvement so PCCM was consulted and they recommended consulting cardiology for possible need of inotropes.  Cardiology was consulted but there was no  recommendation about inotropes initially and then on 01/29/2019, patient's oxygen demand was increased so she was transferred to stepdown unit and started on BiPAP on and off and milrinone drip.  She has had good urine output in last 24 hours compared to day before however patient does not feel any better than yesterday.  Consultants:   PCCM and cardiology  Procedures:   None  Antimicrobials:   IV Rocephin and Zithromax started on 01/22/2019, stopped azithromycin on 01/29/2019.   Subjective: Patient seen and examined in ICU.  She states that she does not feel any better than yesterday.  Looks very weak however she is oriented x4.  Objective: Vitals:   01/30/19 0600 01/30/19 0704 01/30/19 0748 01/30/19 0755  BP: (!) 119/48     Pulse: 87     Resp: (!) 22     Temp:  99.3 F (37.4 C)    TempSrc:  Oral    SpO2: 95%  (!) 88% (!) 88%  Weight:      Height:        Intake/Output Summary (Last 24 hours) at 01/30/2019 0806 Last data filed at 01/30/2019 0600 Gross per 24 hour  Intake 334.75 ml  Output 1350 ml  Net -1015.25 ml   Filed Weights   01/22/19 1242 01/29/19 1000 01/30/19 0500  Weight: 86.6 kg 87.6 kg 85 kg    Examination:  General exam: Appears calm and comfortable but very weak and sick looking Respiratory system: Diffuse crackles bilaterally with scattered rhonchi. Respiratory effort normal. Cardiovascular system: S1 & S2 heard, RRR. No JVD, murmurs, rubs, gallops or clicks.  +2 pitting edema bilateral lower extremity and +1 edema  bilateral upper extremity Gastrointestinal system: Abdomen is nondistended, soft and nontender. No organomegaly or masses felt. Normal bowel sounds heard. Central nervous system: Alert and oriented. No focal neurological deficits. Extremities: Symmetric 5 x 5 power. Skin: No rashes, lesions or ulcers Psychiatry: Judgement and insight appear normal. Mood & affect appropriate.  Data Reviewed: I have personally reviewed following labs and  imaging studies  CBC: Recent Labs  Lab 01/26/19 0516 01/27/19 0537 01/28/19 0322 01/29/19 0357 01/30/19 0439  WBC 4.3 3.8* 4.4 3.7* 5.4  NEUTROABS 2.9 2.5 2.8 2.2 3.4  HGB 8.0* 7.7* 7.8* 7.2* 7.0*  HCT 25.8* 24.7* 25.4* 24.0* 23.8*  MCV 103.6* 102.5* 105.4* 107.6* 107.2*  PLT 60* 59* 59* 56* 57*   Basic Metabolic Panel: Recent Labs  Lab 01/24/19 1219 01/25/19 0516 01/26/19 1006 01/27/19 0537 01/28/19 0322 01/29/19 0357 01/30/19 0439  NA 139 138 136 138 139 140 143  K 4.5 4.3 3.4* 3.5 3.4* 4.3 4.2  CL 99 94* 88* 85* 83* 85* 85*  CO2 34* 35* 38* 45* 45* 46* 49*  GLUCOSE 128* 121* 156* 120* 123* 125* 129*  BUN 13 13 13 14 18  26* 26*  CREATININE 0.84 0.79 0.80 0.74 0.74 0.70 0.75  CALCIUM 8.7* 8.3* 8.1* 8.1* 8.1* 8.0* 8.1*  MG 1.7 1.6* 1.5*  --   --  1.9  --    GFR: Estimated Creatinine Clearance: 65.6 mL/min (by C-G formula based on SCr of 0.75 mg/dL). Liver Function Tests: Recent Labs  Lab 01/24/19 1219 01/25/19 0516 01/26/19 1006 01/27/19 0537  AST 10* 9* 9* 9*  ALT 16 14 13 12   ALKPHOS 48 40 42 42  BILITOT 1.0 1.3* 1.8* 1.9*  PROT 5.8* 5.5* 5.9* 5.8*  ALBUMIN 2.9* 2.7* 2.9* 2.8*   No results for input(s): LIPASE, AMYLASE in the last 168 hours. No results for input(s): AMMONIA in the last 168 hours. Coagulation Profile: No results for input(s): INR, PROTIME in the last 168 hours. Cardiac Enzymes: No results for input(s): CKTOTAL, CKMB, CKMBINDEX, TROPONINI in the last 168 hours. BNP (last 3 results) No results for input(s): PROBNP in the last 8760 hours. HbA1C: No results for input(s): HGBA1C in the last 72 hours. CBG: No results for input(s): GLUCAP in the last 168 hours. Lipid Profile: No results for input(s): CHOL, HDL, LDLCALC, TRIG, CHOLHDL, LDLDIRECT in the last 72 hours. Thyroid Function Tests: No results for input(s): TSH, T4TOTAL, FREET4, T3FREE, THYROIDAB in the last 72 hours. Anemia Panel: No results for input(s): VITAMINB12, FOLATE,  FERRITIN, TIBC, IRON, RETICCTPCT in the last 72 hours. Sepsis Labs: Recent Labs  Lab 01/24/19 1219  PROCALCITON <0.10    Recent Results (from the past 240 hour(s))  Culture, blood (routine x 2)     Status: None   Collection Time: 01/22/19 12:51 PM  Result Value Ref Range Status   Specimen Description   Final    BLOOD RIGHT ANTECUBITAL Performed at [facility], [place] [address], [place], [place] [address]    Special Requests   Final    BOTTLES DRAWN AEROBIC AND ANAEROBIC Blood Culture results may not be optimal due to an excessive volume of blood received in culture bottles Performed at [place] [address], [place], [place] [address]    Culture   Final    NO GROWTH 5 DAYS Performed at [facility], [place] [address], [place],  [address]    Report Status 01/27/2019 FINAL  Final  Culture, blood (routine x  2)     Status: None   Collection Time: 01/22/19 12:56 PM  Result Value Ref Range Status   Specimen Description   Final    BLOOD RIGHT ANTECUBITAL Performed at [facility], [place] [address] [place], [place] [address]    Special Requests   Final    BOTTLES DRAWN AEROBIC AND ANAEROBIC Blood Culture adequate volume Performed at [facility] [address] [place], [place] [address]    Culture   Final    NO GROWTH 5 DAYS Performed at [facility], [place] [address], [place], [place] [address]    Report Status 01/27/2019 FINAL  Final  SARS Coronavirus 2 (CEPHEID- Performed in [place] hospital lab), Hosp Order     Status: None   Collection Time: 01/22/19 12:59 PM  Result Value Ref Range Status   SARS Coronavirus 2 NEGATIVE NEGATIVE Final    Comment: (NOTE) If result is NEGATIVE SARS-CoV-2 target nucleic acids are NOT DETECTED. The SARS-CoV-2 RNA is generally detectable in upper and lower  respiratory specimens during the acute phase of infection. The lowest  concentration of SARS-CoV-2 viral copies  this assay can detect is 250  copies / mL. A negative result does not preclude SARS-CoV-2 infection  and should not be used as the sole basis for treatment or other  patient management decisions.  A negative result may occur with  improper specimen collection / handling, submission of specimen other  than nasopharyngeal swab, presence of viral mutation(s) within the  areas targeted by this assay, and inadequate number of viral copies  (<250 copies / mL). A negative result must be combined with clinical  observations, patient history, and epidemiological information. If result is POSITIVE SARS-CoV-2 target nucleic acids are DETECTED. The SARS-CoV-2 RNA is generally detectable in upper and lower  respiratory specimens dur ing the acute phase of infection.  Positive  results are indicative of active infection with SARS-CoV-2.  Clinical  correlation with patient history and other diagnostic information is  necessary to determine patient infection status.  Positive results do  not rule out bacterial infection or co-infection with other viruses. If result is PRESUMPTIVE POSTIVE SARS-CoV-2 nucleic acids MAY BE PRESENT.   A presumptive positive result was obtained on the submitted specimen  and confirmed on repeat testing.  While 2019 novel coronavirus  (SARS-CoV-2) nucleic acids may be present in the submitted sample  additional confirmatory testing may be necessary for epidemiological  and / or clinical management purposes  to differentiate between  SARS-CoV-2 and other Sarbecovirus currently known to infect humans.  If clinically indicated additional testing with an alternate test  methodology [phone]) is advised. The SARS-CoV-2 RNA is generally  detectable in upper and lower respiratory sp ecimens during the acute  phase of infection. The expected result is Negative. Fact Sheet for Patients:  StrictlyIdeas.no Fact Sheet for Healthcare Providers:  BankingDealers.co.za This test is not yet approved or cleared by the [place] FDA and has been authorized for detection and/or diagnosis of SARS-CoV-2 by FDA under an Emergency Use Authorization (EUA).  This EUA will remain in effect (meaning this test can be used) for the duration of the COVID-19 declaration under Section 564(b)(1) of the Act, 21 U.S.C. section 360bbb-3(b)(1), unless the authorization is terminated or revoked sooner. Performed at [facility], [place] [address], [place], [place] [address]   Respiratory Panel by PCR     Status: None   Collection Time: 01/25/19  1:12 PM  Result Value Ref Range Status   Adenovirus NOT DETECTED NOT DETECTED Final   Coronavirus 229E NOT DETECTED NOT DETECTED Final    Comment: (NOTE) The Coronavirus on the Respiratory Panel, DOES NOT test for the novel  Coronavirus (2019 nCoV)    Coronavirus HKU1 NOT DETECTED NOT DETECTED Final   Coronavirus NL63 NOT DETECTED NOT DETECTED Final   Coronavirus OC43 NOT DETECTED NOT DETECTED Final   Metapneumovirus NOT DETECTED NOT DETECTED Final   Rhinovirus / Enterovirus NOT DETECTED NOT DETECTED Final   Influenza A NOT DETECTED NOT DETECTED Final   Influenza B NOT DETECTED NOT DETECTED Final   Parainfluenza Virus 1 NOT DETECTED NOT DETECTED Final   Parainfluenza Virus 2 NOT DETECTED NOT DETECTED Final   Parainfluenza Virus 3 NOT DETECTED NOT DETECTED Final   Parainfluenza Virus 4 NOT DETECTED NOT DETECTED Final   Respiratory Syncytial Virus NOT DETECTED NOT DETECTED Final   Bordetella pertussis NOT DETECTED NOT DETECTED Final   Chlamydophila pneumoniae NOT DETECTED NOT DETECTED Final   Mycoplasma pneumoniae NOT DETECTED NOT DETECTED Final    Comment: Performed at [facility], [place] [address] [place], [place] [address]  SARS Coronavirus 2 (CEPHEID- Performed in [place] hospital lab), Hosp Order     Status: None   Collection Time: 01/28/19  7:42 AM   Result Value Ref Range Status   SARS Coronavirus 2 NEGATIVE NEGATIVE Final    Comment: (NOTE) If result is NEGATIVE SARS-CoV-2 target nucleic acids are NOT DETECTED. The SARS-CoV-2 RNA is generally detectable in upper and lower  respiratory specimens during the acute phase of infection. The lowest  concentration of SARS-CoV-2 viral copies this assay can detect is 250  copies / mL. A negative result does not preclude SARS-CoV-2 infection  and should not be used as the sole basis for treatment or other  patient management decisions.  A negative result may occur with  improper specimen collection / handling, submission of specimen other  than nasopharyngeal swab, presence of viral mutation(s) within the  areas targeted by this assay, and inadequate number of viral copies  (<250 copies / mL). A negative result must be combined with clinical  observations, patient history, and epidemiological information. If result is POSITIVE SARS-CoV-2 target nucleic acids are DETECTED. The SARS-CoV-2 RNA is generally detectable in upper and lower  respiratory specimens dur ing the acute phase of infection.  Positive  results are indicative of active infection with SARS-CoV-2.  Clinical  correlation with patient history and other diagnostic information is  necessary to determine patient infection status.  Positive results do  not rule out bacterial infection or co-infection with other viruses. If result is PRESUMPTIVE POSTIVE SARS-CoV-2 nucleic acids MAY BE PRESENT.   A presumptive positive result was obtained on the submitted specimen  and confirmed on repeat testing.  While 2019 novel coronavirus  (SARS-CoV-2) nucleic acids may be present in the submitted sample  additional confirmatory testing may be necessary for epidemiological  and / or clinical management purposes  to differentiate between  SARS-CoV-2 and other Sarbecovirus currently known to infect humans.  If clinically indicated additional  testing with an alternate test  methodology [phone]) is advised. The SARS-CoV-2 RNA is generally  detectable in upper and lower respiratory sp ecimens during the acute  phase of infection. The expected result is Negative. Fact Sheet for Patients:  StrictlyIdeas.no Fact Sheet for Healthcare Providers: BankingDealers.co.za This test is not yet approved or cleared by the [place] FDA and has  been authorized for detection and/or diagnosis of SARS-CoV-2 by FDA under an Emergency Use Authorization (EUA).  This EUA will remain in effect (meaning this test can be used) for the duration of the COVID-19 declaration under Section 564(b)(1) of the Act, 21 U.S.C. section 360bbb-3(b)(1), unless the authorization is terminated or revoked sooner. Performed at [facility], [place] [address], [place], [place] [address]   MRSA PCR Screening     Status: Abnormal   Collection Time: 01/29/19  9:40 AM  Result Value Ref Range Status   MRSA by PCR POSITIVE (A) NEGATIVE Final    Comment:        The GeneXpert MRSA Assay (FDA approved for NASAL specimens only), is one component of a comprehensive MRSA colonization surveillance program. It is not intended to diagnose MRSA infection nor to guide or monitor treatment for MRSA infections. RESULT CALLED TO, READ BACK BY AND VERIFIED WITH: H.MORRIS,RN [id number] @1137  BY V.WILKINS Performed at [place] [address] [place],  [address]       Radiology Studies: [place] Ekg Site Rite  Result Date: 01/28/2019 If [facility] image not attached, placement could not be confirmed due to current cardiac rhythm.   Scheduled Meds: . carvedilol  25 mg Oral BID WC  . Chlorhexidine Gluconate Cloth  6 each Topical Q0600  . docusate sodium  100 mg Oral BID  . enoxaparin (LOVENOX) injection  40 mg Subcutaneous Q24H  . folic acid  1 mg Oral Daily  . furosemide  40 mg  Intravenous TID PC  . ipratropium-albuterol  3 mL Nebulization BID  . levothyroxine  150 mcg Oral Q0600  . losartan  25 mg Oral Daily  . mouth rinse  15 mL Mouth Rinse BID  . mupirocin ointment  1 application Nasal BID  . pantoprazole  40 mg Oral Daily  . polyethylene glycol  17 g Oral Daily  . sodium chloride flush  10-40 mL Intracatheter Q12H  . sodium chloride flush  3 mL Intravenous Once  . vitamin B-12  50 mcg Oral Daily   Continuous Infusions: . cefTRIAXone (ROCEPHIN)  IV Stopped (01/29/19 1612)  . milrinone 0.25 mcg/kg/min (01/29/19 2212)     LOS: 8 days   Assessment & Plan:   Principal Problem:   Acute respiratory failure with hypoxia (HCC) Active Problems:   Hypothyroidism   Essential hypertension   Symptomatic anemia   MDS (myelodysplastic syndrome) (HCC)   Multifocal pneumonia   Respiratory failure (HCC)   Dyspnea   Congestive dilated cardiomyopathy (HCC)   Acute systolic heart failure (HCC)   Acute on chronic systolic CHF (congestive heart failure), NYHA class 4 (HCC)  #1 Acute hypoxic respiratory failure secondary to multifocal pneumonia versus acute on chronic systolic congestive heart failure: Patient has been tested negative for COVID 19 x2.  She remains afebrile with no leukocytosis. Unremarkable procalcitonin.  Does not feel any different than yesterday.  Oxygen requirement has been increased and currently on high flow oxygen.  Continues to be on Lasix 40 mg 3 times daily.  Now has elevated BNP.  Continues to be on milrinone drip.  Appreciate PCCM and cardiology help.  #2 acute on chronic anemia secondary to MDS hemoglobin 6.1 at the time of admission, she has received 2 units of PRBC transfusion.  Hemoglobin improved and then dropped to 7.0 on 01/25/2019 and she received another unit of transfusion Per oncology recommendations.  Hemoglobin is dropping again and currently at 7.0.  Will repeat later  today and will transfuse if she is less than 7.  #3  hypertension: Blood pressure controlled.  Continue carvedilol and continue to hold Cozaar.  #4 hypothyroidism continue Synthroid.  #5 hypokalemia: Resolved.  DVT prophylaxis: Lovenox Code Status: Full code Family Communication: Plan discussed with the patient.  No family present.  She has no living family member. Disposition Plan: To be determined pending clinical improvement.  Continue care in ICU for now.   Time spent: 30 minutes   Darliss Cheney, MD [facility] Pager [phone]  If 7PM-7AM, please contact night-coverage [email or web address] Password [email or web address] 01/30/2019, 8:06 AM

## 2019-01-30 NOTE — Progress Notes (Signed)
Pt removed Bipap, desat down to 62. Placed back on 15L HFNC but could only get sats up to 85. Resp consulted, placed on non-rebreather, sats 92, pt resting. Will continue to monitor for need to change back to either HFNC or Bipap. Hoyle Barr, RN

## 2019-01-31 ENCOUNTER — Ambulatory Visit: Payer: Medicare Other

## 2019-01-31 ENCOUNTER — Inpatient Hospital Stay (HOSPITAL_COMMUNITY): Payer: Medicare Other

## 2019-01-31 LAB — CBC WITH DIFFERENTIAL/PLATELET
Abs Immature Granulocytes: 0.11 10*3/uL — ABNORMAL HIGH (ref 0.00–0.07)
Basophils Absolute: 0 10*3/uL (ref 0.0–0.1)
Basophils Relative: 1 %
Eosinophils Absolute: 0 10*3/uL (ref 0.0–0.5)
Eosinophils Relative: 0 %
HCT: 25.5 % — ABNORMAL LOW (ref 36.0–46.0)
Hemoglobin: 7.8 g/dL — ABNORMAL LOW (ref 12.0–15.0)
Immature Granulocytes: 6 %
Lymphocytes Relative: 24 %
Lymphs Abs: 0.5 10*3/uL — ABNORMAL LOW (ref 0.7–4.0)
MCH: 32.1 pg (ref 26.0–34.0)
MCHC: 30.6 g/dL (ref 30.0–36.0)
MCV: 104.9 fL — ABNORMAL HIGH (ref 80.0–100.0)
Monocytes Absolute: 0.1 10*3/uL (ref 0.1–1.0)
Monocytes Relative: 6 %
Neutro Abs: 1.2 10*3/uL — ABNORMAL LOW (ref 1.7–7.7)
Neutrophils Relative %: 63 %
Platelets: 52 10*3/uL — ABNORMAL LOW (ref 150–400)
RBC: 2.43 MIL/uL — ABNORMAL LOW (ref 3.87–5.11)
RDW: 19.5 % — ABNORMAL HIGH (ref 11.5–15.5)
WBC: 1.9 10*3/uL — ABNORMAL LOW (ref 4.0–10.5)
nRBC: 20.6 % — ABNORMAL HIGH (ref 0.0–0.2)

## 2019-01-31 LAB — COOXEMETRY PANEL
Carboxyhemoglobin: 2.6 % — ABNORMAL HIGH (ref 0.5–1.5)
Methemoglobin: 0.6 % (ref 0.0–1.5)
O2 Saturation: 76.4 %
Total hemoglobin: 8.1 g/dL — ABNORMAL LOW (ref 12.0–16.0)

## 2019-01-31 LAB — ANTI-DNA ANTIBODY, DOUBLE-STRANDED: ds DNA Ab: <1 IU/mL (ref 0–9)

## 2019-01-31 LAB — BPAM RBC
Blood Product Expiration Date: 202006112359
ISSUE DATE / TIME: 202005192327
Unit Type and Rh: 5100

## 2019-01-31 LAB — BASIC METABOLIC PANEL
Anion gap: 10 (ref 5–15)
BUN: 28 mg/dL — ABNORMAL HIGH (ref 8–23)
CO2: 42 mmol/L — ABNORMAL HIGH (ref 22–32)
Calcium: 8.2 mg/dL — ABNORMAL LOW (ref 8.9–10.3)
Chloride: 90 mmol/L — ABNORMAL LOW (ref 98–111)
Creatinine, Ser: 0.74 mg/dL (ref 0.44–1.00)
GFR calc Af Amer: >60 mL/min (ref >60)
GFR calc non Af Amer: >60 mL/min (ref >60)
Glucose, Bld: 165 mg/dL — ABNORMAL HIGH (ref 70–99)
Potassium: 2.9 mmol/L — ABNORMAL LOW (ref 3.5–5.1)
Sodium: 142 mmol/L (ref 135–145)

## 2019-01-31 LAB — TYPE AND SCREEN
ABO/RH(D): O POS
Antibody Screen: NEGATIVE
Unit division: 0

## 2019-01-31 LAB — ANCA TITERS
Atypical P-ANCA titer: <1:20 {titer}
C-ANCA: <1:20 {titer}
P-ANCA: <1:20 {titer}

## 2019-01-31 LAB — ANTINUCLEAR ANTIBODIES, IFA: ANA Ab, IFA: NEGATIVE

## 2019-01-31 LAB — RHEUMATOID FACTOR: Rheumatoid fact SerPl-aCnc: 12.4 IU/mL (ref 0.0–13.9)

## 2019-01-31 LAB — GLOMERULAR BASEMENT MEMBRANE ANTIBODIES: GBM Ab: 3 units (ref 0–20)

## 2019-01-31 LAB — MPO/PR-3 (ANCA) ANTIBODIES
ANCA Proteinase 3: <3.5 U/mL (ref 0.0–3.5)
Myeloperoxidase Abs: <9 U/mL (ref 0.0–9.0)

## 2019-01-31 MED ORDER — ACETAZOLAMIDE SODIUM 500 MG IJ SOLR
250.0000 mg | Freq: Once | INTRAMUSCULAR | Status: AC
  Administered 2019-01-31: 250 mg via INTRAVENOUS
  Filled 2019-01-31: qty 250

## 2019-01-31 MED ORDER — POTASSIUM CHLORIDE CRYS ER 20 MEQ PO TBCR
40.0000 meq | EXTENDED_RELEASE_TABLET | ORAL | Status: AC
  Administered 2019-01-31 (×2): 40 meq via ORAL
  Filled 2019-01-31 (×2): qty 2

## 2019-01-31 NOTE — Progress Notes (Addendum)
Physical Therapy Treatment Patient Details Name: [name] MRN: [id number] DOB: [birth year] Today's Date: 01/31/2019    History of Present Illness 73 yo female admitted to ED on 5/11 with ShOB, hypoxia, with PNA sent from [facility]. Transferred to ICU-on BIPAP vs HFNC. 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 PMH includes pacemaker, GERD, EF 25-30% 2010, MVR, cardiomyopathy, depression, CHF.     PT Comments    1st visit since pt was transferred to ICU. Min assist +2 for bed mobility and stand pivot transfer to recliner. O2  bumped up to 12L HFNC for activity-made RN aware. Pt sat level fluctuated between mid 80s%-90%. Pt tolerated activity fairly well. Will continue to follow and progress activity as tolerated. D/C plan has been updated for possible SNF placement 2* current supplementary O2 demand and weakness.    Follow Up Recommendations  SNF vs Home health PT;Supervision/Assistance - 24 hour(depending on progress)     Equipment Recommendations  (continuing to assess)    Recommendations for Other Services       Precautions / Restrictions Precautions Precautions: Fall Precaution Comments: watch sats-HFNC vs BIPAP Restrictions Weight Bearing Restrictions: No    Mobility  Bed Mobility Overal bed mobility: Needs Assistance Bed Mobility: Supine to Sit     Supine to sit: Min assist;+2 for physical assistance;HOB elevated;+2 for safety/equipment     General bed mobility comments: Assist to get to EOB. Sats fluctuated between mid-upper 80s.   Transfers Overall transfer level: Needs assistance Equipment used: 2 person hand held assist Transfers: Sit to/from [facility] Sit to Stand: Min assist;+2 physical assistance;+2 safety/equipment Stand pivot transfers: Min assist;+2 physical assistance;+2 safety/equipment       General transfer comment: Assist to rise, stabilize, control descent. Stand pivot, bed to recliner. Sit to stand x 2.   Ambulation/Gait             General  Gait Details: NT today   Stairs             Wheelchair Mobility    Modified Rankin (Stroke Patients Only)       Balance Overall balance assessment: Needs assistance         Standing balance support: Bilateral upper extremity supported Standing balance-Leahy Scale: Poor                              Cognition Arousal/Alertness: Awake/alert Behavior During Therapy: WFL for tasks assessed/performed Overall Cognitive Status: Within Functional Limits for tasks assessed                                        Exercises      General Comments        Pertinent Vitals/Pain Pain Assessment: No/denies pain    Home Living                      Prior Function            PT Goals (current goals can now be found in the care plan section) Progress towards PT goals: Progressing toward goals    Frequency    Min 3X/week      PT Plan Discharge plan needs to be updated    Co-evaluation              AM-PAC PT "6 Clicks" Mobility   Outcome Measure  Help needed turning from your back to your side while in a flat bed without using bedrails?: A Little Help needed moving from lying on your back to sitting on the side of a flat bed without using bedrails?: A Little Help needed moving to and from a bed to a chair (including a wheelchair)?: A Lot Help needed standing up from a chair using your arms (e.g., wheelchair or bedside chair)?: A Lot Help needed to walk in hospital room?: A Lot Help needed climbing 3-5 steps with a railing? : A Lot 6 Click Score: 14    End of Session Equipment Utilized During Treatment: Oxygen Activity Tolerance: Patient tolerated treatment well Patient left: in chair;with call bell/phone within reach;with chair alarm set   PT Visit Diagnosis: Muscle weakness (generalized) (M62.81);Difficulty in walking, not elsewhere classified (R26.2);Unsteadiness on feet (R26.81)     Time: 9767-3419 PT Time  Calculation (min) (ACUTE ONLY): 20 min  Charges:  $Therapeutic Activity: 8-22 mins                      Weston Anna, PT Acute Rehabilitation Services Pager: [phone] Office: [phone]

## 2019-01-31 NOTE — Progress Notes (Signed)
Progress Note  Due to the COVID-19 pandemic, this visit was completed with telemedicine (audio/video) technology to reduce patient and provider exposure as well as to preserve personal protective equipment.   Cecilie Kicks NP did a phone visit. I did a face to face visit Angelena Form)  Patient Name: [name] Date of Encounter: 01/31/2019  Primary Cardiologist: [name], MD   Subjective   Feeling better this am. Dyspnea improved. No pain  Inpatient Medications    Scheduled Meds: . sodium chloride   Intravenous Once  . carvedilol  25 mg Oral BID WC  . Chlorhexidine Gluconate Cloth  6 each Topical Q0600  . docusate sodium  100 mg Oral BID  . enoxaparin (LOVENOX) injection  40 mg Subcutaneous Q24H  . folic acid  1 mg Oral Daily  . furosemide  40 mg Intravenous TID PC  . ipratropium-albuterol  3 mL Nebulization BID  . levothyroxine  150 mcg Oral Q0600  . losartan  25 mg Oral Daily  . mouth rinse  15 mL Mouth Rinse BID  . methylPREDNISolone (SOLU-MEDROL) injection  60 mg Intravenous Q6H  . mupirocin ointment  1 application Nasal BID  . pantoprazole  40 mg Oral Daily  . polyethylene glycol  17 g Oral Daily  . sodium chloride flush  10-40 mL Intracatheter Q12H  . vitamin B-12  50 mcg Oral Daily   Continuous Infusions: . cefTRIAXone (ROCEPHIN)  IV Stopped (01/30/19 1609)  . milrinone 0.25 mcg/kg/min (01/31/19 0600)   PRN Meds: albuterol, heparin lock flush, heparin lock flush, polyethylene glycol, prochlorperazine   Vital Signs    Vitals:   01/31/19 0338 01/31/19 0400 01/31/19 0411 01/31/19 0600  BP: (!) 121/43 (!) 109/33  (!) 127/55  Pulse: 73 73  80  Resp: 14 14  13   Temp: 98 F (36.7 C)     TempSrc:      SpO2: 96% 97%  95%  Weight:   84.5 kg   Height:        Intake/Output Summary (Last 24 hours) at 01/31/2019 0752 Last data filed at 01/31/2019 0600 Gross per 24 hour  Intake 594.2 ml  Output 500 ml  Net 94.2 ml   Last 3 Weights 01/31/2019 01/30/2019  01/29/2019  Weight (lbs) 186 lb 4.6 oz 187 lb 6.3 oz 193 lb 2 oz  Weight (kg) 84.5 kg 85 kg 87.6 kg      Telemetry    SR with NSVT - - Personally Reviewed  ECG    No new - Personally Reviewed  Physical Exam     See below for PE by MD  Labs    Chemistry Recent Labs  Lab 01/25/19 0516 01/26/19 1006 01/27/19 0537 01/28/19 0322 01/29/19 0357 01/30/19 0439  NA 138 136 138 139 140 143  K 4.3 3.4* 3.5 3.4* 4.3 4.2  CL 94* 88* 85* 83* 85* 85*  CO2 35* 38* 45* 45* 46* 49*  GLUCOSE 121* 156* 120* 123* 125* 129*  BUN 13 13 14 18  26* 26*  CREATININE 0.79 0.80 0.74 0.74 0.70 0.75  CALCIUM 8.3* 8.1* 8.1* 8.1* 8.0* 8.1*  PROT 5.5* 5.9* 5.8*  --   --   --   ALBUMIN 2.7* 2.9* 2.8*  --   --   --   AST 9* 9* 9*  --   --   --   ALT 14 13 12   --   --   --   ALKPHOS 40 42 42  --   --   --  BILITOT 1.3* 1.8* 1.9*  --   --   --   GFRNONAA >60 >60 >60 >60 >60 >60  GFRAA >60 >60 >60 >60 >60 >60  ANIONGAP 9 10 8 11 9 9      Hematology Recent Labs  Lab 01/29/19 0357 01/30/19 0439 01/30/19 1825 01/31/19 0408  WBC 3.7* 5.4  --  1.9*  RBC 2.23* 2.22*  --  2.43*  HGB 7.2* 7.0* 6.7* 7.8*  HCT 24.0* 23.8* 22.6* 25.5*  MCV 107.6* 107.2*  --  104.9*  MCH 32.3 31.5  --  32.1  MCHC 30.0 29.4*  --  30.6  RDW 19.1* 18.4*  --  19.5*  PLT 56* 57*  --  52*    Cardiac EnzymesNo results for input(s): TROPONINI in the last 168 hours. No results for input(s): TROPIPOC in the last 168 hours.   BNP Recent Labs  Lab 01/24/19 1219 01/28/19 0322 01/28/19 1100  BNP 1,387.4* 1,441.5* 1,421.8*     DDimer No results for input(s): DDIMER in the last 168 hours.   Radiology    No results found.  Cardiac Studies   Echo 01/28/19 IMPRESSIONS   1. The left ventricle has severely reduced systolic function, with an ejection fraction of 20-25%. The cavity size was severely dilated. Left ventricular diastolic Doppler parameters are consistent with pseudonormalization. Elevated left ventricular   end-diastolic pressure Left ventricular diffuse hypokinesis. No evidence of LV thrombus by definity contrast study. 2. The right ventricle has normal systolic function. The cavity was normal. There is no increase in right ventricular wall thickness. Right ventricular systolic pressure is moderately elevated with an estimated pressure of 54.9 mmHg. 3. Left atrial size was moderately dilated. 4. There is mild mitral annular calcification present. Mitral valve regurgitation is mild by color flow Doppler. 5. The aortic valve is tricuspid. There is trivial AR. 6. The inferior vena cava was dilated in size with <50% respiratory variability.  FINDINGS Left Ventricle: The left ventricle has severely reduced systolic function, with an ejection fraction of 20-25%. The cavity size was severely dilated. There is no increase in left ventricular wall thickness. Left ventricular diastolic Doppler parameters  are consistent with pseudonormalization. Elevated left ventricular end-diastolic pressure Left ventricular diffuse hypokinesis. Definity contrast agent was given IV to delineate the left ventricular endocardial borders.  Right Ventricle: The right ventricle has normal systolic function. The cavity was normal. There is no increase in right ventricular wall thickness. Right ventricular systolic pressure is moderately elevated with an estimated pressure of 54.9 mmHg. Pacing wire/catheter visualized in the right ventricle.  Left Atrium: Left atrial size was moderately dilated.  Right Atrium: Right atrial size was normal in size. Right atrial pressure is estimated at 15 mmHg.  Interatrial Septum: No atrial level shunt detected by color flow Doppler.  Pericardium: There is no evidence of pericardial effusion.  Mitral Valve: The mitral valve is normal in structure. There is mild mitral annular calcification present. Mitral valve regurgitation is mild by color flow Doppler.  Tricuspid Valve:  The tricuspid valve is normal in structure. Tricuspid valve regurgitation is mild by color flow Doppler.  Aortic Valve: The aortic valve is tricuspid Aortic valve regurgitation was not visualized by color flow Doppler.  Pulmonic Valve: The pulmonic valve was normal in structure. Pulmonic valve regurgitation is not visualized by color flow Doppler.  Venous: The inferior vena cava measures 2.10 cm, is dilated in size with less than 50% respiratory variability.  Patient Profile     73 y.o. female  admitted 5/11 with severe anemiaand hypoxia. 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 Echo 07/17/18 EF 35-30% She has AICD in place with no recent d/c . After transfusion has had increasing dyspnea with CXR showing worsening diffuse bilateral airspace disease CT suggested multi focal pneumonia vs pulmonary edema ground glass appearance. No PE She has not responded well to diuresis BNP is pending K 3.4 Cr 0.74 ARB held on admission On coreg , now on milrinone .   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 EF 2019 at 30-35% and now EF 20-25% --BNP 14218 on 01/28/19 --Lasix 40 mg TID IV --I&O neg 5168 since admit  --wt  192.7lbs at known peak 185 lbs. today   --Cr 0.75  --Co-ox 59, which per Dr. Kyla Balzarine note should be high in setting of anemia. today 76.  --hypoxia - BiPAP added, Milrinone.added.  ?Transfer to [facility] for HF management.defer to Dr. Angelena Form  --on coreg 25 BID and losartan 25 mg once daily  Acute anemiahgb 6.1 on admit  --rec'd 2 units PRBCs. --today 6.7 but recheck 7.8 rec'd 1 unit pRBCs     Pancytopenia/Thrombocytopeniatoday 52K yesterday plts 57K (65K on admit)(WBC 1.9) Hx of AICDwith no shocks. Last visit with Dr. Lovena Le he did not believe she was candidate for up grade with her co morbidities.  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 And again on 01/28/19. Afebrile   MDSper IM (myelodysplastic syndrome)  hypomagnesium resolved  Pt is DNR     For questions or updates, please contact [facility] Please consult [email or web address] for contact info under        Signed, Cecilie Kicks, NP  01/31/2019, 7:52 AM     I have personally seen and examined this patient. I agree with the assessment and plan as outlined above.  I have examined the patient face to face in her room. She is feeling better this am. O2 requirements slightly less. PCCM following.  She has diuresed well with 4.6 L negative since admission.  My exam:  General: Well developed, well nourished, dyspneic appearing but more comfortable HEENT: OP clear, mucus membranes moist  SKIN: warm, dry. No rashes. Neuro: No focal deficits  Musculoskeletal: Muscle strength 5/5 all ext  Psychiatric: Mood and affect normal  Neck: + JVD Lungs:Crackles both bases.  Cardiovascular: Regular rate and rhythm. No murmurs, gallops or rubs. Abdomen:Soft. Bowel sounds present. Non-tender.  Extremities: No lower extremity edema.   Plan: Acute respiratory failure due to presumed volume overload/Acute on chronic systolic CHF: Improving clinically with diuresis. Continue milrinone and IV Lasix today.   Lauree Chandler 01/31/2019 10:02 AM

## 2019-01-31 NOTE — Progress Notes (Signed)
Pt is more alert today. RT placed the Pt on 15L [place] and will titrating the oxygen to maintain SATS of 88%or greater. RT will continue to monitor

## 2019-01-31 NOTE — Progress Notes (Signed)
PROGRESS NOTE    [name]  BHA:[id number] DOB: 10-19-1945 DOA: 01/22/2019 PCP: Elby Showers, MD     Brief Narrative:  [name] is a 73 year old female who presented with dyspnea and weakness. 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 She physical examination blood pressure was 103/35, heart rate 77, respiratory rate 20, temperature 98.4, oxygen saturation 96%,her lungs were showing rhonchi bilaterally, heart S1-S2 present and rhythmic, abdomen was soft, no lower extremity edema. Sodium 132, potassium 3.6, chloride 83, bicarb 31, glucose 122, BUN 22, creatinine 1.0, white count 3.8, hemoglobin 6.1, hematocrit 19.0, platelets 65.Urinalysis negative for infection.Her chest x-ray had bilateral nodular infiltrates. SARS COVID-19 negative.  Patient was admitted to hospital working diagnosis of acute hypoxic respiratory failure due to multifocal pneumonia and was started on IV Rocephin and Zithromax. PCCM and cardiology were also consulted due to worsening respiratory distress.  She was found to have acute exacerbation of systolic heart failure and started on IV Lasix and milrinone.  She has also been on and off BiPAP.  New events last 24 hours / Subjective: Currently off BiPAP this morning and on high flow nasal cannula O2.  Denies any worsening shortness of breath this morning.  No chest pain.  Appears very frail and weak.  Assessment & Plan:   Principal Problem:   Acute respiratory failure with hypoxia (HCC) Active Problems:   Hypothyroidism   Essential hypertension   Symptomatic anemia   MDS (myelodysplastic syndrome)  (HCC)   Multifocal pneumonia   Respiratory failure (HCC)   Dyspnea   Congestive dilated cardiomyopathy (HCC)   Acute systolic heart failure (HCC)   Acute on chronic systolic CHF (congestive heart failure), NYHA class 4 (HCC)   Acute hypoxic respiratory failuresecondary to multifocal pneumonia versus acute on chronic systolic congestive heart failure -Initially thought to be infectious in etiology and was started on Rocephin and azithromycin.  Patient has been tested negative for COVID 19 x2.    Panel also negative.  She remains afebrile.  Now off antibiotic -Appears to be related more so due to acute exacerbation of chronic systolic congestive heart failure.  -Off BiPAP this morning, continue high flow nasal cannula O2 and wean as able -Continue IV Lasix, milrinone, Solu-Medrol -Autoimmune labs pending -PCCM, cardiology following   Pancytopeniasecondary to MDS  -She has received total 4u pRBC during admission  -Continue to trend CBC closely  Hypertension -Blood pressure controlled.  Continue Coreg and Cozaar  Hypothyroidism -Continue Synthroid.  Hypokalemia -Replace, trend   DVT prophylaxis: Lovenox Code Status: DNR Family Communication: None Disposition Plan: Continue above treatments for acute respiratory failure, remains on milrinone, on and off BiPAP.   Consultants:   PCCM  Cardiology  Procedures:   None  Antimicrobials:  Anti-infectives (From admission, onward)   Start     Dose/Rate Route Frequency Ordered Stop   01/25/19 1400  azithromycin (ZITHROMAX) tablet 500 mg  Status:  Discontinued     500 mg Oral Every 24 hours 01/25/19 0946 01/29/19 0847   01/23/19 1400  azithromycin (ZITHROMAX) 500 mg in sodium chloride 0.9 % 250 mL IVPB  Status:  Discontinued     500 mg 250 mL/hr  over 60 Minutes Intravenous Every 24 hours 01/22/19 1428 01/25/19 0946   01/22/19 1500  cefTRIAXone (ROCEPHIN) 1 g in sodium chloride 0.9 % 100 mL IVPB  Status:  Discontinued      1 g 200 mL/hr over 30 Minutes Intravenous Every 24 hours 01/22/19 1428 01/31/19 1034   01/22/19 1300  cefTRIAXone (ROCEPHIN) 1 g in sodium chloride 0.9 % 100 mL IVPB  Status:  Discontinued     1 g 200 mL/hr over 30 Minutes Intravenous  Once 01/22/19 1258 01/22/19 1429   01/22/19 1300  azithromycin (ZITHROMAX) 500 mg in sodium chloride 0.9 % 250 mL IVPB     500 mg 250 mL/hr over 60 Minutes Intravenous  Once 01/22/19 1258 01/22/19 1431       Objective: Vitals:   01/31/19 0801 01/31/19 0803 01/31/19 0806 01/31/19 0820  BP:  (!) 146/51    Pulse:  93    Resp:  (!) 21    Temp:  97.7 F (36.5 C)    TempSrc:  Oral    SpO2: 100%  98% 96%  Weight:      Height:        Intake/Output Summary (Last 24 hours) at 01/31/2019 1035 Last data filed at 01/31/2019 0952 Gross per 24 hour  Intake 581.68 ml  Output 1300 ml  Net -718.32 ml   Filed Weights   01/29/19 1000 01/30/19 0500 01/31/19 0411  Weight: 87.6 kg 85 kg 84.5 kg    Examination:  General exam: Appears calm and comfortable  Respiratory system: No respiratory distress, crackles bilaterally, on high flow nasal cannula O2 Cardiovascular system: S1 & S2 heard, RRR. No JVD, murmurs, rubs, gallops or clicks. No pedal edema. Gastrointestinal system: Abdomen is nondistended, soft and nontender. No organomegaly or masses felt. Normal bowel sounds heard. Central nervous system: Alert and oriented. No focal neurological deficits. Extremities: Symmetric  Skin: No rashes, lesions or ulcers Psychiatry: Judgement and insight appear normal. Mood & affect appropriate.   Data Reviewed: I have personally reviewed following labs and imaging studies  CBC: Recent Labs  Lab 01/27/19 0537 01/28/19 0322 01/29/19 0357 01/30/19 0439 01/30/19 1825 01/31/19 0408  WBC 3.8* 4.4 3.7* 5.4  --  1.9*  NEUTROABS 2.5 2.8 2.2 3.4  --  1.2*  HGB 7.7* 7.8* 7.2* 7.0* 6.7* 7.8*  HCT 24.7* 25.4* 24.0* 23.8* 22.6* 25.5*  MCV 102.5* 105.4* 107.6* 107.2*  --   104.9*  PLT 59* 59* 56* 57*  --  52*   Basic Metabolic Panel: Recent Labs  Lab 01/24/19 1219 01/25/19 0516 01/26/19 1006 01/27/19 0537 01/28/19 0322 01/29/19 0357 01/30/19 0439 01/31/19 0759  NA 139 138 136 138 139 140 143 142  K 4.5 4.3 3.4* 3.5 3.4* 4.3 4.2 2.9*  CL 99 94* 88* 85* 83* 85* 85* 90*  CO2 34* 35* 38* 45* 45* 46* 49* 42*  GLUCOSE 128* 121* 156* 120* 123* 125* 129* 165*  BUN 13 13 13 14 18  26* 26* 28*  CREATININE 0.84 0.79 0.80 0.74 0.74 0.70 0.75 0.74  CALCIUM 8.7* 8.3* 8.1* 8.1* 8.1* 8.0* 8.1* 8.2*  MG 1.7 1.6* 1.5*  --   --  1.9  --   --    GFR: Estimated Creatinine Clearance: 65.4 mL/min (by C-G formula based on SCr of 0.74 mg/dL). Liver Function Tests: Recent Labs  Lab 01/24/19 1219 01/25/19 0516 01/26/19 1006 01/27/19 0537  AST 10* 9* 9* 9*  ALT 16 14 13 12   ALKPHOS 48 40 42 42  BILITOT 1.0 1.3* 1.8* 1.9*  PROT 5.8* 5.5* 5.9* 5.8*  ALBUMIN 2.9* 2.7* 2.9* 2.8*   No results for input(s): LIPASE, AMYLASE in the last 168 hours. No results for input(s): AMMONIA in the last 168 hours. Coagulation Profile: No results for input(s): INR, PROTIME in the last 168 hours. Cardiac Enzymes: No results for input(s): CKTOTAL, CKMB, CKMBINDEX, TROPONINI in the last 168 hours. BNP (last 3 results) No results for input(s): PROBNP in the last 8760 hours. HbA1C: No results for input(s): HGBA1C in the last 72 hours. CBG: No results for input(s): GLUCAP in the last 168 hours. Lipid Profile: No results for input(s): CHOL, HDL, LDLCALC, TRIG, CHOLHDL, LDLDIRECT in the last 72 hours. Thyroid Function Tests: No results for input(s): TSH, T4TOTAL, FREET4, T3FREE, THYROIDAB in the last 72 hours. Anemia Panel: No results for input(s): VITAMINB12, FOLATE, FERRITIN, TIBC, IRON, RETICCTPCT in the last 72 hours. Sepsis Labs: Recent Labs  Lab 01/24/19 1219  PROCALCITON <0.10    Recent Results (from the past 240 hour(s))  Culture, blood (routine x 2)     Status: None    Collection Time: 01/22/19 12:51 PM  Result Value Ref Range Status   Specimen Description   Final    BLOOD RIGHT ANTECUBITAL Performed at [facility], [place] [address], [place], [place] [address]    Special Requests   Final    BOTTLES DRAWN AEROBIC AND ANAEROBIC Blood Culture results may not be optimal due to an excessive volume of blood received in culture bottles Performed at [facility] [address] [place], [place] [address]    Culture   Final    NO GROWTH 5 DAYS Performed at [facility], [place] [address] [place], [place] [address]    Report Status 01/27/2019 FINAL  Final  Culture, blood (routine x 2)     Status: None   Collection Time: 01/22/19 12:56 PM  Result Value Ref Range Status   Specimen Description   Final    BLOOD RIGHT ANTECUBITAL Performed at [facility], [place] [address], [place], [place] [address]    Special Requests   Final    BOTTLES DRAWN AEROBIC AND ANAEROBIC Blood Culture adequate volume Performed at [facility] [address], [place], [place] [address]    Culture   Final    NO GROWTH 5 DAYS Performed at [facility], [place] [address] [place], [place] [address]    Report Status 01/27/2019 FINAL  Final  SARS Coronavirus 2 (CEPHEID- Performed in [facility] hospital lab), Hosp Order     Status: None   Collection Time: 01/22/19 12:59 PM  Result Value Ref Range Status   SARS Coronavirus 2 NEGATIVE NEGATIVE Final    Comment: (NOTE) If result is NEGATIVE SARS-CoV-2 target nucleic acids are NOT DETECTED. The SARS-CoV-2 RNA is generally detectable in upper and lower  respiratory specimens during the acute phase of infection. The lowest  concentration of SARS-CoV-2 viral copies this assay can detect is 250  copies / mL. A negative result does not preclude SARS-CoV-2 infection  and should not be used as the sole basis for treatment or other  patient management decisions.  A negative result may  occur with  improper specimen collection / handling, submission of specimen other  than nasopharyngeal swab, presence of viral mutation(s) within the  areas targeted by this assay, and inadequate number of viral copies  (<250 copies / mL). A negative result must be  combined with clinical  observations, patient history, and epidemiological information. If result is POSITIVE SARS-CoV-2 target nucleic acids are DETECTED. The SARS-CoV-2 RNA is generally detectable in upper and lower  respiratory specimens dur ing the acute phase of infection.  Positive  results are indicative of active infection with SARS-CoV-2.  Clinical  correlation with patient history and other diagnostic information is  necessary to determine patient infection status.  Positive results do  not rule out bacterial infection or co-infection with other viruses. If result is PRESUMPTIVE POSTIVE SARS-CoV-2 nucleic acids MAY BE PRESENT.   A presumptive positive result was obtained on the submitted specimen  and confirmed on repeat testing.  While 2019 novel coronavirus  (SARS-CoV-2) nucleic acids may be present in the submitted sample  additional confirmatory testing may be necessary for epidemiological  and / or clinical management purposes  to differentiate between  SARS-CoV-2 and other Sarbecovirus currently known to infect humans.  If clinically indicated additional testing with an alternate test  methodology [phone]) is advised. The SARS-CoV-2 RNA is generally  detectable in upper and lower respiratory sp ecimens during the acute  phase of infection. The expected result is Negative. Fact Sheet for Patients:  StrictlyIdeas.no Fact Sheet for Healthcare Providers: BankingDealers.co.za This test is not yet approved or cleared by the [place] FDA and has been authorized for detection and/or diagnosis of SARS-CoV-2 by FDA under an Emergency Use Authorization (EUA).  This  EUA will remain in effect (meaning this test can be used) for the duration of the COVID-19 declaration under Section 564(b)(1) of the Act, 21 U.S.C. section 360bbb-3(b)(1), unless the authorization is terminated or revoked sooner. Performed at [facility], [place] [address] [place], [place] [address]   Respiratory Panel by PCR     Status: None   Collection Time: 01/25/19  1:12 PM  Result Value Ref Range Status   Adenovirus NOT DETECTED NOT DETECTED Final   Coronavirus 229E NOT DETECTED NOT DETECTED Final    Comment: (NOTE) The Coronavirus on the Respiratory Panel, DOES NOT test for the novel  Coronavirus (2019 nCoV)    Coronavirus HKU1 NOT DETECTED NOT DETECTED Final   Coronavirus NL63 NOT DETECTED NOT DETECTED Final   Coronavirus OC43 NOT DETECTED NOT DETECTED Final   Metapneumovirus NOT DETECTED NOT DETECTED Final   Rhinovirus / Enterovirus NOT DETECTED NOT DETECTED Final   Influenza A NOT DETECTED NOT DETECTED Final   Influenza B NOT DETECTED NOT DETECTED Final   Parainfluenza Virus 1 NOT DETECTED NOT DETECTED Final   Parainfluenza Virus 2 NOT DETECTED NOT DETECTED Final   Parainfluenza Virus 3 NOT DETECTED NOT DETECTED Final   Parainfluenza Virus 4 NOT DETECTED NOT DETECTED Final   Respiratory Syncytial Virus NOT DETECTED NOT DETECTED Final   Bordetella pertussis NOT DETECTED NOT DETECTED Final   Chlamydophila pneumoniae NOT DETECTED NOT DETECTED Final   Mycoplasma pneumoniae NOT DETECTED NOT DETECTED Final    Comment: Performed at [facility], [place]. [address] [place], [place] [address]  SARS Coronavirus 2 (CEPHEID- Performed in [facility] hospital lab), Hosp Order     Status: None   Collection Time: 01/28/19  7:42 AM  Result Value Ref Range Status   SARS Coronavirus 2 NEGATIVE NEGATIVE Final    Comment: (NOTE) If result is NEGATIVE SARS-CoV-2 target nucleic acids are NOT DETECTED. The SARS-CoV-2 RNA is generally detectable in upper and lower   respiratory specimens during the acute phase of infection. The lowest  concentration of  SARS-CoV-2 viral copies this assay can detect is 250  copies / mL. A negative result does not preclude SARS-CoV-2 infection  and should not be used as the sole basis for treatment or other  patient management decisions.  A negative result may occur with  improper specimen collection / handling, submission of specimen other  than nasopharyngeal swab, presence of viral mutation(s) within the  areas targeted by this assay, and inadequate number of viral copies  (<250 copies / mL). A negative result must be combined with clinical  observations, patient history, and epidemiological information. If result is POSITIVE SARS-CoV-2 target nucleic acids are DETECTED. The SARS-CoV-2 RNA is generally detectable in upper and lower  respiratory specimens dur ing the acute phase of infection.  Positive  results are indicative of active infection with SARS-CoV-2.  Clinical  correlation with patient history and other diagnostic information is  necessary to determine patient infection status.  Positive results do  not rule out bacterial infection or co-infection with other viruses. If result is PRESUMPTIVE POSTIVE SARS-CoV-2 nucleic acids MAY BE PRESENT.   A presumptive positive result was obtained on the submitted specimen  and confirmed on repeat testing.  While 2019 novel coronavirus  (SARS-CoV-2) nucleic acids may be present in the submitted sample  additional confirmatory testing may be necessary for epidemiological  and / or clinical management purposes  to differentiate between  SARS-CoV-2 and other Sarbecovirus currently known to infect humans.  If clinically indicated additional testing with an alternate test  methodology [phone]) is advised. The SARS-CoV-2 RNA is generally  detectable in upper and lower respiratory sp ecimens during the acute  phase of infection. The expected result is Negative. Fact  Sheet for Patients:  StrictlyIdeas.no Fact Sheet for Healthcare Providers: BankingDealers.co.za This test is not yet approved or cleared by the [place] FDA and has been authorized for detection and/or diagnosis of SARS-CoV-2 by FDA under an Emergency Use Authorization (EUA).  This EUA will remain in effect (meaning this test can be used) for the duration of the COVID-19 declaration under Section 564(b)(1) of the Act, 21 U.S.C. section 360bbb-3(b)(1), unless the authorization is terminated or revoked sooner. Performed at [facility], [place] [address], [place], [place] [address]   MRSA PCR Screening     Status: Abnormal   Collection Time: 01/29/19  9:40 AM  Result Value Ref Range Status   MRSA by PCR POSITIVE (A) NEGATIVE Final    Comment:        The GeneXpert MRSA Assay (FDA approved for NASAL specimens only), is one component of a comprehensive MRSA colonization surveillance program. It is not intended to diagnose MRSA infection nor to guide or monitor treatment for MRSA infections. RESULT CALLED TO, READ BACK BY AND VERIFIED WITH: H.MORRIS,RN [id number] @1137  BY V.WILKINS Performed at [facility] [address] [place], [place] [address]        Radiology Studies: No results found.    Scheduled Meds: . sodium chloride   Intravenous Once  . carvedilol  25 mg Oral BID WC  . Chlorhexidine Gluconate Cloth  6 each Topical Q0600  . docusate sodium  100 mg Oral BID  . enoxaparin (LOVENOX) injection  40 mg Subcutaneous Q24H  . folic acid  1 mg Oral Daily  . furosemide  40 mg Intravenous TID PC  . ipratropium-albuterol  3 mL Nebulization BID  . levothyroxine  150 mcg Oral Q0600  . losartan  25 mg Oral Daily  . mouth  rinse  15 mL Mouth Rinse BID  . methylPREDNISolone (SOLU-MEDROL) injection  60 mg Intravenous Q6H  . mupirocin ointment  1 application Nasal BID  . pantoprazole  40 mg Oral  Daily  . polyethylene glycol  17 g Oral Daily  . potassium chloride  40 mEq Oral Q4H  . sodium chloride flush  10-40 mL Intracatheter Q12H  . vitamin B-12  50 mcg Oral Daily   Continuous Infusions: . milrinone 0.25 mcg/kg/min (01/31/19 0803)     LOS: 9 days    Time spent: 35 minutes   Dessa Phi, DO [facility] [email or web address] 01/31/2019, 10:35 AM

## 2019-01-31 NOTE — Evaluation (Signed)
Occupational Therapy Evaluation Patient Details Name: [name] MRN: [id number] DOB: [birth year] Today's Date: 01/31/2019    History of Present Illness 73 yo female admitted to ED on 5/11 with ShOB, hypoxia, with PNA sent from MD. Transferred to ICU-on BIPAP vs HFNC. 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 PMH includes pacemaker, GERD, EF 25-30% 2010, MVR, cardiomyopathy, depression, CHF.    Clinical Impression   Pt was admitted for the above.  Unsure of PLOF:  Pt reported more independent PLOF with OT today than she did last week with PT.  Very limited by respiratory status.  Increased 02 and took rest breaks during evaluation today (up to 12 liters HFNC).  Pt needs min A +2 for SPT and up to max A for LB adls at this time. Will follow in acute setting.      Follow Up Recommendations  SNF;Supervision/Assistance - 24 hour    Equipment Recommendations  None recommended by OT    Recommendations for Other Services       Precautions / Restrictions Precautions Precautions: Fall Precaution Comments: watch sats-HFNC vs BIPAP Restrictions Weight Bearing Restrictions: No      Mobility Bed Mobility Overal bed mobility: Needs Assistance Bed Mobility: Supine to Sit     Supine to sit: Min assist;+2 for physical assistance;HOB elevated;+2 for safety/equipment     General bed mobility comments: Assist to get to EOB. Sats fluctuated between mid-upper 80s.   Transfers Overall transfer level: Needs assistance Equipment used: 2 person hand held assist Transfers: Sit to/from [facility] Sit to Stand: Min assist;+2 physical assistance;+2 safety/equipment Stand pivot transfers: Min assist;+2 physical assistance;+2 safety/equipment       General transfer comment: Assist to rise, stabilize, control descent. Stand pivot, bed to recliner. Sit to stand x 2.     Balance Overall balance assessment: Needs assistance         Standing balance support: Bilateral upper extremity  supported Standing balance-Leahy Scale: Poor                             ADL either performed or assessed with clinical judgement   ADL Overall ADL's : Needs assistance/impaired Eating/Feeding: Independent   Grooming: Set up   Upper Body Bathing: Minimal assistance   Lower Body Bathing: Moderate assistance   Upper Body Dressing : Minimal assistance   Lower Body Dressing: Maximal assistance   Toilet Transfer: Minimal assistance;+2 for safety/equipment;Stand-pivot(chair)   Toileting- Clothing Manipulation and Hygiene: Moderate assistance         General ADL Comments: Increased assistance for adls based on clinical judgment due to respiratory status/desaturation with activity     Vision         Perception     Praxis      Pertinent Vitals/Pain Pain Assessment: No/denies pain     Hand Dominance Right   Extremity/Trunk Assessment Upper Extremity Assessment Upper Extremity Assessment: Overall WFL for tasks assessed           Communication Communication Communication: No difficulties   Cognition Arousal/Alertness: Awake/alert Behavior During Therapy: WFL for tasks assessed/performed Overall Cognitive Status: No family/caregiver present to determine baseline cognitive functioning                                 General Comments: gave conflicting information during OT evaluation (vs PT evaluation)   General Comments  Pt bumped up from 8  liters to 12 during activity to maintain sats 88-91%.  Mid 80s on 8 liters with min activity    Exercises     Shoulder Instructions      Home Living Family/patient expects to be discharged to:: Private residence Living Arrangements: Alone Available Help at Discharge: Friend(s);Available PRN/intermittently Type of Home: House             Bathroom Shower/Tub: Teacher, early years/pre: Standard     Home Equipment: Tub bench;Walker - 2 wheels;Bedside commode          Prior  Functioning/Environment          Comments: Pt told this OT that she was totally independent including shopping for groceries.  She told PT that she uses a RW and friends have been cooking and cleaning        OT Problem List: Decreased strength;Decreased activity tolerance;Impaired balance (sitting and/or standing);Decreased cognition;Decreased knowledge of use of DME or AE;Cardiopulmonary status limiting activity      OT Treatment/Interventions: Self-care/ADL training;Energy conservation;DME and/or AE instruction;Patient/family education;Balance training;Therapeutic activities;Cognitive remediation/compensation    OT Goals(Current goals can be found in the care plan section) Acute Rehab OT Goals Patient Stated Goal: go home, breathe better OT Goal Formulation: With patient Time For Goal Achievement: 02/14/19 Potential to Achieve Goals: Good ADL Goals Pt Will Perform Grooming: with supervision;standing Pt Will Transfer to Toilet: with supervision;ambulating;bedside commode;stand pivot transfer Pt Will Perform Toileting - Clothing Manipulation and hygiene: with supervision;sit to/from stand Additional ADL Goal #1: pt will complete LB adls with set up/supervision using AE and with rest breaks Additional ADL Goal #2: pt will verbalize 3 energy conservation strategies  OT Frequency: Min 2X/week   Barriers to D/C:            Co-evaluation              AM-PAC OT "6 Clicks" Daily Activity     Outcome Measure Help from another person eating meals?: None Help from another person taking care of personal grooming?: A Little Help from another person toileting, which includes using toliet, bedpan, or urinal?: A Lot Help from another person bathing (including washing, rinsing, drying)?: A Lot Help from another person to put on and taking off regular upper body clothing?: A Little Help from another person to put on and taking off regular lower body clothing?: A Lot 6 Click Score: 16    End of Session    Activity Tolerance: Patient limited by fatigue Patient left: in chair;with call bell/phone within reach;with chair alarm set  OT Visit Diagnosis: Unsteadiness on feet (R26.81)                Time: 9811-9147 OT Time Calculation (min): 19 min Charges:  OT General Charges $OT Visit: 1 Visit OT Evaluation $OT Eval Low Complexity: [place], OTR/L Acute Rehabilitation Services [phone] [facility] pager [phone] office 01/31/2019  [facility] 01/31/2019, 1:41 PM

## 2019-01-31 NOTE — Progress Notes (Signed)
Name: [name] MRN: [id number] DOB: 11/07/1945    ADMISSION DATE:  01/22/2019 CONSULTATION DATE:  01/31/2019   REFERRING MD :  Doristine Bosworth, [facility]  CHIEF COMPLAINT:  Hypoxia, resp distress  BRIEF PATIENT DESCRIPTION: 73 year old woman with nonischemic cardiomyopathy, EF 25 to 30% and myelodysplastic syndrome admitted 5/12 with bilateral nodular infiltrates, COVID negative, respiratory viral panel negative. PCCM consulted for worsening infiltrates and hypoxia 5/17  SIGNIFICANT EVENTS  5/11 Admit to [facility]  5/14 PRBC transfusion x 1 5/19 PCCM consulted for nodular infiltrates  STUDIES:  ECHO 07/2018 >> diffuse hypokinesis with EF 25 to 30%, moderate MR  CTA Chest 5/12 >> diffuse bilateral patchy groundglass infiltrates-edema versus multifocal pneumonia with enlarged pulmonary artery and trace bilateral effusions  SUBJECTIVE:  Increasingly alert , now tolerating HFNC, T Max overnight 100.2, WBC 1.9, states her breathing is a bit better. She is happy to have a break from BiPAP, Net negative 4.4 L  VITAL SIGNS: Temp:  [97.7 F (36.5 C)-100.2 F (37.9 C)] 97.7 F (36.5 C) (05/20 0803) Pulse Rate:  [73-93] 93 (05/20 0803) Resp:  [13-29] 21 (05/20 0803) BP: (96-146)/(33-55) 146/51 (05/20 0803) SpO2:  [88 %-100 %] 98 % (05/20 0806) FiO2 (%):  [40 %] 40 % (05/19 1945) Weight:  [84.5 kg] 84.5 kg (05/20 0411)  PHYSICAL EXAMINATION: General: elderly female lying in bed on high flow [place] at 12 L HEENT: MM pink/dry, HF [place] in place with sats of 93% Neuro:Awake and alert, following commands, MAE x 4, conversant   CV: s1s2 rrr, no m/r/g, SR per tele PULM: Bilateral chest excursion,No nasal flaring or sternal retractions. lungs bilaterally with crackles per bases ON:GEXB, bsx4 active, Obese  Extremities: warm/dry, trace edema , brisk refill, no obvious deformities Skin: no rashes or lesions, warm, dry and intact  Recent Labs  Lab 01/29/19 0357 01/30/19 0439 01/31/19 0759  NA 140 143  142  K 4.3 4.2 2.9*  CL 85* 85* 90*  CO2 46* 49* 42*  BUN 26* 26* 28*  CREATININE 0.70 0.75 0.74  GLUCOSE 125* 129* 165*   Recent Labs  Lab 01/29/19 0357 01/30/19 0439 01/30/19 1825 01/31/19 0408  HGB 7.2* 7.0* 6.7* 7.8*  HCT 24.0* 23.8* 22.6* 25.5*  WBC 3.7* 5.4  --  1.9*  PLT 56* 57*  --  52*   No results found.  ASSESSMENT / PLAN:  Acute Hypoxemic Respiratory Failure  Bilateral infiltrates - of unclear etiology, unlikely to be infectious >, PCT negative, COVID testing neg x 2. Differential diagnosis remains acute systolic heart failure with pulmonary edema but surprisingly she has not responded to diuretics.  Less likely TRALI - last transfusion was on 5/14.  Would hold off on further transfusions unless hemoglobin less than 7.  ? Related to MDS even though recent BM biopsy did not suggest crisis. Also, must consider Vidaza (can cause pneumonitis, ILD).  Autoimmune labs : Sed rate 109  CRP 20.9 RF 12.4 All other auto immune labs pending ? Slight improvement with initiation of steroids  Plan: Continue BiPAP PRN / Alternate with HFNC as able BiPAP for AMS or desaturations NPO while on BiPAP Sips and chips while off BiPAP Trend CXR DNR  Continue Milrinone / lasix for now Monitor CO-Ox Appreciate Cardiology input  Follow results of  ESR, CRP, ANA, DSDNA, ANCA, GBM, RF, CCP  Continue Solumedrol 60 mg IV Q6  For now   Suspected Contraction Alkalosis  -CO2 on BMP has risen from 31 to 49 - CO2 Down  to 42 on 5/20 - Received 2 doses Diamox 5/19 P: Diamox 250 mg IV BID x 1  Dose  5/20  Leukopenia WBC drop from 5.4 to 1.9 overnight T Max 100.2 overnight P: Trend WBC Follow auto immune work up Continue ABX per primary team  Anemia P Transfusions per primary team   Magdalen Spatz, AGACNP-BC [facility] Pgr: [phone] or if no answer [phone] 01/31/2019, 8:44 AM

## 2019-02-01 ENCOUNTER — Ambulatory Visit: Payer: Medicare Other

## 2019-02-01 ENCOUNTER — Inpatient Hospital Stay (HOSPITAL_COMMUNITY): Payer: Medicare Other

## 2019-02-01 LAB — CBC WITH DIFFERENTIAL/PLATELET
Abs Immature Granulocytes: 0.13 10*3/uL — ABNORMAL HIGH (ref 0.00–0.07)
Basophils Absolute: 0 10*3/uL (ref 0.0–0.1)
Basophils Relative: 0 %
Eosinophils Absolute: 0 10*3/uL (ref 0.0–0.5)
Eosinophils Relative: 0 %
HCT: 27.1 % — ABNORMAL LOW (ref 36.0–46.0)
Hemoglobin: 8.4 g/dL — ABNORMAL LOW (ref 12.0–15.0)
Immature Granulocytes: 6 %
Lymphocytes Relative: 17 %
Lymphs Abs: 0.4 10*3/uL — ABNORMAL LOW (ref 0.7–4.0)
MCH: 32.1 pg (ref 26.0–34.0)
MCHC: 31 g/dL (ref 30.0–36.0)
MCV: 103.4 fL — ABNORMAL HIGH (ref 80.0–100.0)
Monocytes Absolute: 0.3 10*3/uL (ref 0.1–1.0)
Monocytes Relative: 11 %
Neutro Abs: 1.6 10*3/uL — ABNORMAL LOW (ref 1.7–7.7)
Neutrophils Relative %: 66 %
Platelets: 57 10*3/uL — ABNORMAL LOW (ref 150–400)
RBC: 2.62 MIL/uL — ABNORMAL LOW (ref 3.87–5.11)
RDW: 18.3 % — ABNORMAL HIGH (ref 11.5–15.5)
WBC: 2.3 10*3/uL — ABNORMAL LOW (ref 4.0–10.5)
nRBC: 17.9 % — ABNORMAL HIGH (ref 0.0–0.2)

## 2019-02-01 LAB — BASIC METABOLIC PANEL
Anion gap: 7 (ref 5–15)
BUN: 32 mg/dL — ABNORMAL HIGH (ref 8–23)
CO2: 43 mmol/L — ABNORMAL HIGH (ref 22–32)
Calcium: 8.1 mg/dL — ABNORMAL LOW (ref 8.9–10.3)
Chloride: 91 mmol/L — ABNORMAL LOW (ref 98–111)
Creatinine, Ser: 0.7 mg/dL (ref 0.44–1.00)
GFR calc Af Amer: >60 mL/min (ref >60)
GFR calc non Af Amer: >60 mL/min (ref >60)
Glucose, Bld: 154 mg/dL — ABNORMAL HIGH (ref 70–99)
Potassium: 3.5 mmol/L (ref 3.5–5.1)
Sodium: 141 mmol/L (ref 135–145)

## 2019-02-01 LAB — BLOOD GAS, ARTERIAL
Acid-Base Excess: 15.6 mmol/L — ABNORMAL HIGH (ref 0.0–2.0)
Bicarbonate: 42.4 mmol/L — ABNORMAL HIGH (ref 20.0–28.0)
Drawn by: 257701
O2 Content: 10 L/min
O2 Saturation: 93.2 %
Patient temperature: 98
pCO2 arterial: 68.3 mmHg (ref 32.0–48.0)
pH, Arterial: 7.408 (ref 7.350–7.450)
pO2, Arterial: 62.1 mmHg — ABNORMAL LOW (ref 83.0–108.0)

## 2019-02-01 LAB — COOXEMETRY PANEL
Carboxyhemoglobin: 2.1 % — ABNORMAL HIGH (ref 0.5–1.5)
Methemoglobin: 0.9 % (ref 0.0–1.5)
O2 Saturation: 77.8 %
Total hemoglobin: 8.5 g/dL — ABNORMAL LOW (ref 12.0–16.0)

## 2019-02-01 LAB — MAGNESIUM: Magnesium: 2.4 mg/dL (ref 1.7–2.4)

## 2019-02-01 LAB — CYCLIC CITRUL PEPTIDE ANTIBODY, IGG/IGA: CCP Antibodies IgG/IgA: 8 units (ref 0–19)

## 2019-02-01 MED ORDER — ENSURE ENLIVE PO LIQD
237.0000 mL | Freq: Two times a day (BID) | ORAL | Status: DC
  Administered 2019-02-01 – 2019-02-08 (×12): 237 mL via ORAL

## 2019-02-01 MED ORDER — ADULT MULTIVITAMIN W/MINERALS CH
1.0000 | ORAL_TABLET | Freq: Every day | ORAL | Status: DC
  Administered 2019-02-01 – 2019-02-08 (×8): 1 via ORAL
  Filled 2019-02-01 (×7): qty 1

## 2019-02-01 MED ORDER — POTASSIUM CHLORIDE CRYS ER 20 MEQ PO TBCR
40.0000 meq | EXTENDED_RELEASE_TABLET | Freq: Once | ORAL | Status: AC
  Administered 2019-02-01: 40 meq via ORAL
  Filled 2019-02-01: qty 2

## 2019-02-01 MED ORDER — POTASSIUM CHLORIDE 20 MEQ/15ML (10%) PO SOLN: 40.0000 meq | Freq: Two times a day (BID) | ORAL | Status: DC

## 2019-02-01 NOTE — Progress Notes (Signed)
Paged MD about patients low BP. Patient MAP less than 65 all morning. Lasix has been given 40mg  x2 today. Per MD, will not make any changes at this time since patient is asymptomatic. Will continue to watch patient closely.

## 2019-02-01 NOTE — Progress Notes (Signed)
Physical Therapy Treatment Patient Details Name: [name] MRN: [id number] DOB: 1946-02-23 Today's Date: 02/01/2019    History of Present Illness 73 yo female admitted to ED on 5/11 with ShOB, hypoxia, with PNA sent from [facility]. Transferred to ICU-on BIPAP vs HFNC. 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 PMH includes pacemaker, GERD, EF 25-30% 2010, MVR, cardiomyopathy, depression, CHF.     PT Comments    Pt very cooperative but limited by poor endurance and bowel incontinence.   Follow Up Recommendations  SNF;Home health PT;Supervision/Assistance - 24 hour     Equipment Recommendations  None recommended by PT    Recommendations for Other Services       Precautions / Restrictions Precautions Precautions: Fall Precaution Comments: watch sats: HFNC Restrictions Weight Bearing Restrictions: No    Mobility  Bed Mobility Overal bed mobility: Needs Assistance Bed Mobility: Supine to Sit     Supine to sit: Min assist Sit to supine: Supervision;HOB elevated   General bed mobility comments: light assistance to get to EOB  Transfers Overall transfer level: Needs assistance Equipment used: Rolling walker (2 wheeled) Transfers: Sit to/from [facility] Sit to Stand: Min assist Stand pivot transfers: Min assist       General transfer comment: light assistance to stand and take a couple of steps to chair  Ambulation/Gait Ambulation/Gait assistance: Min assist;+2 safety/equipment Gait Distance (Feet): 5 Feet Assistive device: Rolling walker (2 wheeled) Gait Pattern/deviations: Step-through pattern;Decreased stride length Gait velocity: decr    General Gait Details: cues for position from RW - distance ltd by fatigue   Stairs             Wheelchair Mobility    Modified Rankin (Stroke Patients Only)       Balance Overall balance assessment: Needs assistance Sitting-balance support: No upper extremity supported;Feet supported Sitting balance-Leahy Scale:  Good     Standing balance support: Bilateral upper extremity supported Standing balance-Leahy Scale: Poor                              Cognition Arousal/Alertness: Awake/alert Behavior During Therapy: WFL for tasks assessed/performed Overall Cognitive Status: No family/caregiver present to determine baseline cognitive functioning                                        Exercises General Exercises - Lower Extremity Ankle Circles/Pumps: Both;AROM;10 reps;Supine    General Comments General comments (skin integrity, edema, etc.): VSS, mostly in 90s, had one bad reading during transfer      Pertinent Vitals/Pain Pain Assessment: No/denies pain    Home Living                      Prior Function            PT Goals (current goals can now be found in the care plan section) Acute Rehab PT Goals Patient Stated Goal: go home, breathe better PT Goal Formulation: With patient Time For Goal Achievement: 02/06/19 Potential to Achieve Goals: Good Progress towards PT goals: Progressing toward goals    Frequency    Min 3X/week      PT Plan Discharge plan needs to be updated    Co-evaluation PT/OT/SLP Co-Evaluation/Treatment: Yes Reason for Co-Treatment: For patient/therapist safety PT goals addressed during session: Mobility/safety with mobility OT goals addressed during session: ADL's and self-care  AM-PAC PT "6 Clicks" Mobility   Outcome Measure  Help needed turning from your back to your side while in a flat bed without using bedrails?: A Little Help needed moving from lying on your back to sitting on the side of a flat bed without using bedrails?: A Little Help needed moving to and from a bed to a chair (including a wheelchair)?: A Lot Help needed standing up from a chair using your arms (e.g., wheelchair or bedside chair)?: A Lot Help needed to walk in hospital room?: A Lot Help needed climbing 3-5 steps with a railing? : A  Lot 6 Click Score: 14    End of Session Equipment Utilized During Treatment: Oxygen Activity Tolerance: Patient limited by fatigue Patient left: in chair;with call bell/phone within reach;with chair alarm set Nurse Communication: Mobility status PT Visit Diagnosis: Muscle weakness (generalized) (M62.81);Difficulty in walking, not elsewhere classified (R26.2);Unsteadiness on feet (R26.81)     Time: 1829-9371 PT Time Calculation (min) (ACUTE ONLY): 25 min  Charges:  $Therapeutic Activity: 8-22 mins                     [place] Pager [phone] Office [phone]    Sargon Scouten 02/01/2019, 2:26 PM

## 2019-02-01 NOTE — TOC Progression Note (Signed)
Transition of Care [facility]) - Progression Note    Patient Details  Name: [name] MRN: [id number] Date of Birth: [birth year]  Transition of Care [facility]) CM/SW Contact  Joaquin Courts, RN Phone Number: 02/01/2019, 2:11 PM  Clinical Narrative:  SNF choice offered for facilities that has extended a ed offer thus far ([facility], [facility], [facility], [facility], [facility], and [facility]).  Patient is not ready to make a decision at this time and wishes to wait for other potential offers. CM to follow-up tomorrow.   Expected Discharge Plan: Skilled Nursing Facility Barriers to Discharge: Continued Medical Work up  Expected Discharge Plan and Services Expected Discharge Plan: [place]   Discharge Planning Services: CM Consult   Living arrangements for the past 2 months: Single Family Home Expected Discharge Date: (unknown)                                     Social Determinants of Health (SDOH) Interventions    Readmission Risk Interventions Readmission Risk Prevention Plan 01/29/2019  Transportation Screening Complete  PCP or Specialist Appt within 3-5 Days Not Complete  Some recent data might be hidden

## 2019-02-01 NOTE — Progress Notes (Signed)
Occupational Therapy Treatment Patient Details Name: [name] MRN: [id number] DOB: 06-Jun-1946 Today's Date: 02/01/2019    History of present illness 73 yo female admitted to ED on 5/11 with ShOB, hypoxia, with PNA sent from [facility]. Transferred to ICU-on BIPAP vs HFNC. 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 PMH includes pacemaker, GERD, EF 25-30% 2010, MVR, cardiomyopathy, depression, CHF.    OT comments  Improved activity while staying on 10 liters HFNC.  Pt has decreased endurance. Would benefit from SNF. Will continue with POC  Follow Up Recommendations  SNF    Equipment Recommendations    3:1 commode   Recommendations for Other Services      Precautions / Restrictions Precautions Precautions: Fall Precaution Comments: watch sats: HFNC Restrictions Weight Bearing Restrictions: No       Mobility Bed Mobility         Supine to sit: Min assist     General bed mobility comments: light assistance to get to EOB  Transfers   Equipment used: Rolling walker (2 wheeled)   Sit to Stand: Min assist Stand pivot transfers: Min assist       General transfer comment: light assistance to stand and take a couple of steps to chair    Balance                                           ADL either performed or assessed with clinical judgement   ADL       Grooming: Oral care;Set up(EOB)                       Toileting- Clothing Manipulation and Hygiene: Total assistance;Sit to/from stand         General ADL Comments: performed oral care at EOB prior to getting into chair. Pt took a couple of steps:  02 remained stable on 10 liters, high flow, but pt fatiqued and needed to sit. She did not want to get up again after resting.  Pt was incontinent x bowel. Did not have time to transfer onto commode.  She continues to need a lot of assistance for adls due to fatique.  She is able to cross legs for adls, but endurance is the issue     Vision       Perception      Praxis      Cognition Arousal/Alertness: Awake/alert Behavior During Therapy: WFL for tasks assessed/performed Overall Cognitive Status: No family/caregiver present to determine baseline cognitive functioning                                          Exercises     Shoulder Instructions       General Comments VSS, mostly in 90s, had one bad reading during transfer    Pertinent Vitals/ Pain       Pain Assessment: No/denies pain  Home Living                                          Prior Functioning/Environment              Frequency  Min 2X/week        Progress  Toward Goals  OT Goals(current goals can now be found in the care plan section)  Progress towards OT goals: Progressing toward goals(slowly)     Plan      Co-evaluation    PT/OT/SLP Co-Evaluation/Treatment: Yes Reason for Co-Treatment: For patient/therapist safety PT goals addressed during session: Mobility/safety with mobility OT goals addressed during session: ADL's and self-care      AM-PAC OT "6 Clicks" Daily Activity     Outcome Measure   Help from another person eating meals?: None Help from another person taking care of personal grooming?: A Little Help from another person toileting, which includes using toliet, bedpan, or urinal?: A Lot Help from another person bathing (including washing, rinsing, drying)?: A Lot Help from another person to put on and taking off regular upper body clothing?: A Little Help from another person to put on and taking off regular lower body clothing?: A Lot 6 Click Score: 16    End of Session    OT Visit Diagnosis: Unsteadiness on feet (R26.81)   Activity Tolerance Patient limited by fatigue   Patient Left in chair;with call bell/phone within reach;with chair alarm set   Nurse Communication          Time: [phone] OT Time Calculation (min): 29 min  Charges: OT General Charges $OT Visit: 1 Visit OT  Treatments $Self Care/Home Management : 8-22 mins  Lesle Chris, OTR/L Acute Rehabilitation Services [phone] [facility] pager [phone] office 02/01/2019   [place] 02/01/2019, 11:51 AM

## 2019-02-01 NOTE — Progress Notes (Addendum)
PROGRESS NOTE    [name]  NLZ:[id number] DOB: [birth year] DOA: 01/22/2019 PCP: Elby Showers, MD     Brief Narrative:  [name] is a 73 year old female who presented with dyspnea and weakness. 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 She physical examination blood pressure was 103/35, heart rate 77, respiratory rate 20, temperature 98.4, oxygen saturation 96%,her lungs were showing rhonchi bilaterally, heart S1-S2 present and rhythmic, abdomen was soft, no lower extremity edema. Sodium 132, potassium 3.6, chloride 83, bicarb 31, glucose 122, BUN 22, creatinine 1.0, white count 3.8, hemoglobin 6.1, hematocrit 19.0, platelets 65.Urinalysis negative for infection.Her chest x-ray had bilateral nodular infiltrates. SARS COVID-19 negative.  Patient was admitted to hospital working diagnosis of acute hypoxic respiratory failure due to multifocal pneumonia and was started on IV Rocephin and Zithromax. PCCM and cardiology were also consulted due to worsening respiratory distress.  She was found to have acute exacerbation of systolic heart failure and started on IV Lasix and milrinone.  She has also been on and off BiPAP.  New events last 24 hours / Subjective: She has been off of BiPAP, currently on high flow nasal cannula O2 but desatting into the mid 80s with conversation.  She denies any worsening symptoms, no chest pain or worsening shortness of breath.  Continues to have some dry cough.  Assessment & Plan:   Principal Problem:   Acute respiratory failure with hypoxia (HCC) Active Problems:   Hypothyroidism   Essential  hypertension   Symptomatic anemia   MDS (myelodysplastic syndrome) (HCC)   Multifocal pneumonia   Respiratory failure (HCC)   Dyspnea   Congestive dilated cardiomyopathy (HCC)   Acute systolic heart failure (HCC)   Acute on chronic systolic CHF (congestive heart failure), NYHA class 4 (HCC)   Acute hypoxic respiratory failuresecondary to acute on chronic systolic congestive heart failure and acute lung injury -Initially thought to be infectious in etiology and was started on Rocephin and azithromycin.  Patient has been tested negative for COVID 19 x2.    Panel also negative.  She remains afebrile.  Now off antibiotic -Appears to be related more so due to acute exacerbation of chronic systolic congestive heart failure.  Now off milrinone.  Continue IV Lasix.  Cardiology following -Also related to acute lung injury.  Continue IV Solu-Medrol.  PCCM following -Off BiPAP this morning, continue high flow nasal cannula O2 and wean as able -Chest x-ray reviewed independently today: Continues to have diffuse interstitial and airspace pathology, unchanged from previous chest x-ray  Pancytopeniasecondary to MDS  -She has received total 4u pRBC during admission  -Continue to trend CBC closely  Hypertension -Blood pressure on the lower side.  Continue Coreg and Cozaar - held PM dose of Coreg 5/21   Hypothyroidism -Continue Synthroid.  Hypokalemia -Replace, trend   DVT prophylaxis: Lovenox Code Status: DNR Family Communication: None Disposition Plan: Continue above treatments for acute respiratory failure, continue to wean off oxygen as able.   Consultants:   PCCM  Cardiology  Procedures:   None  Antimicrobials:  Anti-infectives (From admission, onward)   Start     Dose/Rate Route Frequency Ordered Stop   01/25/19 1400  azithromycin (ZITHROMAX) tablet 500 mg  Status:  Discontinued     500 mg Oral Every 24 hours 01/25/19 0946 01/29/19 0847   01/23/19 1400  azithromycin  (ZITHROMAX) 500 mg in sodium chloride 0.9 % 250 mL IVPB  Status:  Discontinued     500 mg 250 mL/hr over 60 Minutes Intravenous Every 24 hours 01/22/19 1428 01/25/19 0946   01/22/19 1500  cefTRIAXone (ROCEPHIN) 1 g in sodium chloride 0.9 % 100 mL IVPB  Status:  Discontinued     1 g 200 mL/hr over 30 Minutes Intravenous Every 24 hours 01/22/19 1428 01/31/19 1034   01/22/19 1300  cefTRIAXone (ROCEPHIN) 1 g in sodium chloride 0.9 % 100 mL IVPB  Status:  Discontinued     1 g 200 mL/hr over 30 Minutes Intravenous  Once 01/22/19 1258 01/22/19 1429   01/22/19 1300  azithromycin (ZITHROMAX) 500 mg in sodium chloride 0.9 % 250 mL IVPB     500 mg 250 mL/hr over 60 Minutes Intravenous  Once 01/22/19 1258 01/22/19 1431       Objective: Vitals:   02/01/19 0930 02/01/19 1000 02/01/19 1030 02/01/19 1100  BP: (!) 97/36 (!) 105/27 (!) 107/46 (!) 102/45  Pulse: 73 71 65 66  Resp: 19 20 13 13   Temp:    98 F (36.7 C)  TempSrc:    Oral  SpO2: 92% 96% 100% 99%  Weight:      Height:        Intake/Output Summary (Last 24 hours) at 02/01/2019 1141 Last data filed at 02/01/2019 0939 Gross per 24 hour  Intake 304.15 ml  Output 750 ml  Net -445.85 ml   Filed Weights   01/30/19 0500 01/31/19 0411 02/01/19 0427  Weight: 85 kg 84.5 kg 81.8 kg    Examination: General exam: Appears calm and comfortable  Respiratory system: Bilateral crackles, on high flow nasal cannula O2, no increase in respiratory effort Cardiovascular system: S1 & S2 heard, RRR. No JVD, murmurs, rubs, gallops or clicks. No pedal edema. Gastrointestinal system: Abdomen is nondistended, soft and nontender. No organomegaly or masses felt. Normal bowel sounds heard. Central nervous system: Alert and oriented. No focal neurological deficits. Extremities: Symmetric 5 x 5 power. Skin: No rashes, lesions or ulcers Psychiatry: Judgement and insight appear normal. Mood & affect appropriate.    Data Reviewed: I have personally reviewed  following labs and imaging studies  CBC: Recent Labs  Lab 01/28/19 0322 01/29/19 0357 01/30/19 0439 01/30/19 1825 01/31/19 0408 02/01/19 0416  WBC 4.4 3.7* 5.4  --  1.9* 2.3*  NEUTROABS 2.8 2.2 3.4  --  1.2* 1.6*  HGB 7.8* 7.2* 7.0* 6.7* 7.8* 8.4*  HCT 25.4* 24.0* 23.8* 22.6* 25.5* 27.1*  MCV 105.4* 107.6* 107.2*  --  104.9* 103.4*  PLT 59* 56* 57*  --  52* 57*   Basic Metabolic Panel: Recent Labs  Lab 01/26/19 1006  01/28/19 0322 01/29/19 0357 01/30/19 0439 01/31/19 0759 02/01/19 0416  NA 136   < > 139 140 143 142 141  K 3.4*   < > 3.4* 4.3 4.2 2.9* 3.5  CL 88*   < > 83* 85* 85* 90* 91*  CO2 38*   < > 45* 46* 49* 42* 43*  GLUCOSE 156*   < > 123* 125* 129* 165* 154*  BUN 13   < > 18 26* 26* 28* 32*  CREATININE 0.80   < > 0.74 0.70 0.75 0.74 0.70  CALCIUM 8.1*   < > 8.1* 8.0*  8.1* 8.2* 8.1*  MG 1.5*  --   --  1.9  --   --  2.4   < > = values in this interval not displayed.   GFR: Estimated Creatinine Clearance: 64.4 mL/min (by C-G formula based on SCr of 0.7 mg/dL). Liver Function Tests: Recent Labs  Lab 01/26/19 1006 01/27/19 0537  AST 9* 9*  ALT 13 12  ALKPHOS 42 42  BILITOT 1.8* 1.9*  PROT 5.9* 5.8*  ALBUMIN 2.9* 2.8*   No results for input(s): LIPASE, AMYLASE in the last 168 hours. No results for input(s): AMMONIA in the last 168 hours. Coagulation Profile: No results for input(s): INR, PROTIME in the last 168 hours. Cardiac Enzymes: No results for input(s): CKTOTAL, CKMB, CKMBINDEX, TROPONINI in the last 168 hours. BNP (last 3 results) No results for input(s): PROBNP in the last 8760 hours. HbA1C: No results for input(s): HGBA1C in the last 72 hours. CBG: No results for input(s): GLUCAP in the last 168 hours. Lipid Profile: No results for input(s): CHOL, HDL, LDLCALC, TRIG, CHOLHDL, LDLDIRECT in the last 72 hours. Thyroid Function Tests: No results for input(s): TSH, T4TOTAL, FREET4, T3FREE, THYROIDAB in the last 72 hours. Anemia Panel: No  results for input(s): VITAMINB12, FOLATE, FERRITIN, TIBC, IRON, RETICCTPCT in the last 72 hours. Sepsis Labs: No results for input(s): PROCALCITON, LATICACIDVEN in the last 168 hours.  Recent Results (from the past 240 hour(s))  Culture, blood (routine x 2)     Status: None   Collection Time: 01/22/19 12:51 PM  Result Value Ref Range Status   Specimen Description   Final    BLOOD RIGHT ANTECUBITAL Performed at [facility], [place] [address], [place], [place] [address]    Special Requests   Final    BOTTLES DRAWN AEROBIC AND ANAEROBIC Blood Culture results may not be optimal due to an excessive volume of blood received in culture bottles Performed at [facility] [address], [place], [place] [address]    Culture   Final    NO GROWTH 5 DAYS Performed at [facility], [place] [address] [place], [place] [address]    Report Status 01/27/2019 FINAL  Final  Culture, blood (routine x 2)     Status: None   Collection Time: 01/22/19 12:56 PM  Result Value Ref Range Status   Specimen Description   Final    BLOOD RIGHT ANTECUBITAL Performed at [facility], [place] [address], [place], [place] [address]    Special Requests   Final    BOTTLES DRAWN AEROBIC AND ANAEROBIC Blood Culture adequate volume Performed at [facility] [address] [place], [place] [address]    Culture   Final    NO GROWTH 5 DAYS Performed at [facility], [place] [address] [place], [place] [address]    Report Status 01/27/2019 FINAL  Final  SARS Coronavirus 2 (CEPHEID- Performed in [facility] hospital lab), Hosp Order     Status: None   Collection Time: 01/22/19 12:59 PM  Result Value Ref Range Status   SARS Coronavirus 2 NEGATIVE NEGATIVE Final    Comment: (NOTE) If result is NEGATIVE SARS-CoV-2 target nucleic acids are NOT DETECTED. The SARS-CoV-2 RNA is generally detectable in upper and lower  respiratory specimens during the acute phase of  infection. The lowest  concentration of SARS-CoV-2 viral copies this assay can detect is 250  copies / mL. A negative result does not preclude SARS-CoV-2 infection  and should not be used as the sole basis for treatment or other  patient management decisions.  A negative result may occur with  improper specimen collection / handling, submission of specimen other  than nasopharyngeal swab, presence of viral mutation(s) within the  areas targeted by this assay, and inadequate number of viral copies  (<250 copies / mL). A negative result must be combined with clinical  observations, patient history, and epidemiological information. If result is POSITIVE SARS-CoV-2 target nucleic acids are DETECTED. The SARS-CoV-2 RNA is generally detectable in upper and lower  respiratory specimens dur ing the acute phase of infection.  Positive  results are indicative of active infection with SARS-CoV-2.  Clinical  correlation with patient history and other diagnostic information is  necessary to determine patient infection status.  Positive results do  not rule out bacterial infection or co-infection with other viruses. If result is PRESUMPTIVE POSTIVE SARS-CoV-2 nucleic acids MAY BE PRESENT.   A presumptive positive result was obtained on the submitted specimen  and confirmed on repeat testing.  While 2019 novel coronavirus  (SARS-CoV-2) nucleic acids may be present in the submitted sample  additional confirmatory testing may be necessary for epidemiological  and / or clinical management purposes  to differentiate between  SARS-CoV-2 and other Sarbecovirus currently known to infect humans.  If clinically indicated additional testing with an alternate test  methodology [phone]) is advised. The SARS-CoV-2 RNA is generally  detectable in upper and lower respiratory sp ecimens during the acute  phase of infection. The expected result is Negative. Fact Sheet for Patients:   StrictlyIdeas.no Fact Sheet for Healthcare Providers: BankingDealers.co.za This test is not yet approved or cleared by the [place] FDA and has been authorized for detection and/or diagnosis of SARS-CoV-2 by FDA under an Emergency Use Authorization (EUA).  This EUA will remain in effect (meaning this test can be used) for the duration of the COVID-19 declaration under Section 564(b)(1) of the Act, 21 U.S.C. section 360bbb-3(b)(1), unless the authorization is terminated or revoked sooner. Performed at [facility], [place] [address], [place], [place] [address]   Respiratory Panel by PCR     Status: None   Collection Time: 01/25/19  1:12 PM  Result Value Ref Range Status   Adenovirus NOT DETECTED NOT DETECTED Final   Coronavirus 229E NOT DETECTED NOT DETECTED Final    Comment: (NOTE) The Coronavirus on the Respiratory Panel, DOES NOT test for the novel  Coronavirus (2019 nCoV)    Coronavirus HKU1 NOT DETECTED NOT DETECTED Final   Coronavirus NL63 NOT DETECTED NOT DETECTED Final   Coronavirus OC43 NOT DETECTED NOT DETECTED Final   Metapneumovirus NOT DETECTED NOT DETECTED Final   Rhinovirus / Enterovirus NOT DETECTED NOT DETECTED Final   Influenza A NOT DETECTED NOT DETECTED Final   Influenza B NOT DETECTED NOT DETECTED Final   Parainfluenza Virus 1 NOT DETECTED NOT DETECTED Final   Parainfluenza Virus 2 NOT DETECTED NOT DETECTED Final   Parainfluenza Virus 3 NOT DETECTED NOT DETECTED Final   Parainfluenza Virus 4 NOT DETECTED NOT DETECTED Final   Respiratory Syncytial Virus NOT DETECTED NOT DETECTED Final   Bordetella pertussis NOT DETECTED NOT DETECTED Final   Chlamydophila pneumoniae NOT DETECTED NOT DETECTED Final   Mycoplasma pneumoniae NOT DETECTED NOT DETECTED Final    Comment: Performed at [facility], [place]. [address], [place], [place] [address]  SARS Coronavirus 2 (CEPHEID- Performed in [facility]  hospital lab), [facility]  Status: None   Collection Time: 01/28/19  7:42 AM  Result Value Ref Range Status   SARS Coronavirus 2 NEGATIVE NEGATIVE Final    Comment: (NOTE) If result is NEGATIVE SARS-CoV-2 target nucleic acids are NOT DETECTED. The SARS-CoV-2 RNA is generally detectable in upper and lower  respiratory specimens during the acute phase of infection. The lowest  concentration of SARS-CoV-2 viral copies this assay can detect is 250  copies / mL. A negative result does not preclude SARS-CoV-2 infection  and should not be used as the sole basis for treatment or other  patient management decisions.  A negative result may occur with  improper specimen collection / handling, submission of specimen other  than nasopharyngeal swab, presence of viral mutation(s) within the  areas targeted by this assay, and inadequate number of viral copies  (<250 copies / mL). A negative result must be combined with clinical  observations, patient history, and epidemiological information. If result is POSITIVE SARS-CoV-2 target nucleic acids are DETECTED. The SARS-CoV-2 RNA is generally detectable in upper and lower  respiratory specimens dur ing the acute phase of infection.  Positive  results are indicative of active infection with SARS-CoV-2.  Clinical  correlation with patient history and other diagnostic information is  necessary to determine patient infection status.  Positive results do  not rule out bacterial infection or co-infection with other viruses. If result is PRESUMPTIVE POSTIVE SARS-CoV-2 nucleic acids MAY BE PRESENT.   A presumptive positive result was obtained on the submitted specimen  and confirmed on repeat testing.  While 2019 novel coronavirus  (SARS-CoV-2) nucleic acids may be present in the submitted sample  additional confirmatory testing may be necessary for epidemiological  and / or clinical management purposes  to differentiate between  SARS-CoV-2 and other  Sarbecovirus currently known to infect humans.  If clinically indicated additional testing with an alternate test  methodology [phone]) is advised. The SARS-CoV-2 RNA is generally  detectable in upper and lower respiratory sp ecimens during the acute  phase of infection. The expected result is Negative. Fact Sheet for Patients:  StrictlyIdeas.no Fact Sheet for Healthcare Providers: BankingDealers.co.za This test is not yet approved or cleared by the [place] FDA and has been authorized for detection and/or diagnosis of SARS-CoV-2 by FDA under an Emergency Use Authorization (EUA).  This EUA will remain in effect (meaning this test can be used) for the duration of the COVID-19 declaration under Section 564(b)(1) of the Act, 21 U.S.C. section 360bbb-3(b)(1), unless the authorization is terminated or revoked sooner. Performed at [facility], [place] [address] [place], [place] [address]   MRSA PCR Screening     Status: Abnormal   Collection Time: 01/29/19  9:40 AM  Result Value Ref Range Status   MRSA by PCR POSITIVE (A) NEGATIVE Final    Comment:        The GeneXpert MRSA Assay (FDA approved for NASAL specimens only), is one component of a comprehensive MRSA colonization surveillance program. It is not intended to diagnose MRSA infection nor to guide or monitor treatment for MRSA infections. RESULT CALLED TO, READ BACK BY AND VERIFIED WITH: H.MORRIS,RN [id number] @1137  BY V.WILKINS Performed at [facility] [address], [place], [place] [address]        Radiology Studies: Dg Chest Port 1 View  Result Date: 02/01/2019 CLINICAL DATA:  Respiratory failure EXAM: PORTABLE CHEST 1 VIEW COMPARISON:  01/31/2019 FINDINGS: Cardiac shadow is enlarged but stable. Defibrillator is again seen as  well as a right-sided PICC line. Diffuse interstitial and airspace disease is noted stable from the prior  exam consistent with congestive failure. Basilar densities remain and stable. No bony abnormality is seen. IMPRESSION: CHF and bibasilar atelectasis stable from the prior exam. Electronically Signed   By: Inez Catalina M.D.   On: 02/01/2019 07:45   Dg Chest Port 1 View  Result Date: 01/31/2019 CLINICAL DATA:  Hypoxia. EXAM: PORTABLE CHEST 1 VIEW COMPARISON:  One-view chest x-ray 01/27/2019 FINDINGS: The heart is enlarged. Extensive interstitial and airspace opacities are present. Increased densities are noted lung bases bilaterally. Pacing wires are stable. IMPRESSION: 1. Cardiomegaly with diffuse interstitial and airspace disease compatible with congestive heart failure. 2. Bibasilar consolidation. This likely reflects atelectasis. Infection is not excluded. Electronically Signed   By: San Morelle M.D.   On: 01/31/2019 11:55      Scheduled Meds:  sodium chloride   Intravenous Once   carvedilol  25 mg Oral BID WC   Chlorhexidine Gluconate Cloth  6 each Topical Q0600   docusate sodium  100 mg Oral BID   enoxaparin (LOVENOX) injection  40 mg Subcutaneous Q24H   feeding supplement (ENSURE ENLIVE)  237 mL Oral BID BM   folic acid  1 mg Oral Daily   furosemide  40 mg Intravenous TID PC   ipratropium-albuterol  3 mL Nebulization BID   levothyroxine  150 mcg Oral Q0600   losartan  25 mg Oral Daily   mouth rinse  15 mL Mouth Rinse BID   methylPREDNISolone (SOLU-MEDROL) injection  60 mg Intravenous Q6H   multivitamin with minerals  1 tablet Oral Daily   mupirocin ointment  1 application Nasal BID   pantoprazole  40 mg Oral Daily   polyethylene glycol  17 g Oral Daily   sodium chloride flush  10-40 mL Intracatheter Q12H   vitamin B-12  50 mcg Oral Daily   Continuous Infusions:  milrinone Stopped (01/31/19 1957)     LOS: 10 days    Time spent: 35 minutes   Dessa Phi, DO [facility] [email or web address] 02/01/2019, 11:41 AM

## 2019-02-01 NOTE — TOC Progression Note (Signed)
Transition of Care [facility]) - Progression Note    Patient Details  Name: [name] MRN: [id number] Date of Birth: 1946/04/25  [facility] CM/SW Contact  Joaquin Courts, RN Phone Number: 02/01/2019, 9:59 AM  Clinical Narrative:    CM spoke with patient regarding updated PT recommendations for SNF.  Pt is in agreement to explore SNF options. FL-2 faxed out to area facilities, pending response.     Expected Discharge Plan: Skilled Nursing Facility Barriers to Discharge: Continued Medical Work up  Expected Discharge Plan and Services Expected Discharge Plan: [place]   Discharge Planning Services: CM Consult   Living arrangements for the past 2 months: Single Family Home Expected Discharge Date: (unknown)                                     Social Determinants of Health (SDOH) Interventions    Readmission Risk Interventions Readmission Risk Prevention Plan 01/29/2019  Transportation Screening Complete  PCP or Specialist Appt within 3-5 Days Not Complete  Some recent data might be hidden

## 2019-02-01 NOTE — Progress Notes (Signed)
Added sterile water to high flow nasal cannula system ([place]).

## 2019-02-01 NOTE — Progress Notes (Signed)
CM received call from patient friend, [name], with questions about SNF placement.  CM spoke with patient who gave verbal consent for CM to discuss SNF placement with [name]. CM discussed process for SNF placement and answered questions. Plan to offer choice once bed offers received.

## 2019-02-01 NOTE — NC FL2 (Signed)
[place] LEVEL OF CARE SCREENING TOOL     IDENTIFICATION  Patient Name: [name] Birthdate: 13-Aug-1946 Sex: female Admission Date (Current Location): 01/22/2019  [facility] and [place] Number:  Herbalist and Address:  [facility],  [place], [place]      Provider Number: [id number]  Attending Physician Name and Address:  Dessa Phi, DO  Relative Name and Phone Number:       Current Level of Care: Hospital Recommended Level of Care: [place] Prior Approval Number:    Date Approved/Denied:   PASRR Number: [id number]  Discharge Plan: SNF    Current Diagnoses: Patient Active Problem List   Diagnosis Date Noted  . Acute systolic heart failure ([place])   . Acute on chronic systolic CHF (congestive heart failure), NYHA class 4 ([place])   . Dyspnea   . Congestive dilated cardiomyopathy ([place])   . Respiratory failure ([place]) 01/23/2019  . Multifocal pneumonia 01/22/2019  . Acute respiratory failure with hypoxia ([place])   . MDS (myelodysplastic syndrome) ([place]) 12/27/2018  . Iron deficiency 09/21/2018  . Symptomatic anemia 08/03/2018  . Impaired glucose tolerance 06/21/2017  . Cough variant asthma  vs UACS from ACEi  12/31/2016  . Essential hypertension 12/31/2016  . Morbid obesity due to excess calories ([place]) 12/31/2016  . History of colonic polyps   . Benign neoplasm of descending colon   . Diverticulosis of colon without hemorrhage 01/28/2014  . Serrated adenoma of colon 01/28/2014  . Nonspecific abnormal finding in stool contents 11/29/2013  . Colon cancer screening 11/29/2013  . History of depression 08/11/2012  . Chronic systolic CHF (congestive heart failure) ([place])   . Systolic CHF, chronic ([place])   . LBBB (left bundle branch block)   . Automatic implantable cardioverter-defibrillator in situ   . Hypothyroidism   . GERD (gastroesophageal reflux disease)   . Dyslipidemia   . Mitral  regurgitation   . Overweight   . Ejection fraction < 50%     Orientation RESPIRATION BLADDER Height & Weight     Self, Time, Situation, Place  Normal Continent Weight: 81.8 kg Height:  5\' 3"  (160 cm)  BEHAVIORAL SYMPTOMS/MOOD NEUROLOGICAL BOWEL NUTRITION STATUS      Continent Diet  AMBULATORY STATUS COMMUNICATION OF NEEDS Skin   Extensive Assist Verbally Normal                       Personal Care Assistance Level of Assistance  Bathing, Dressing Bathing Assistance: Limited assistance   Dressing Assistance: Limited assistance     Functional Limitations Info             SPECIAL CARE FACTORS FREQUENCY  OT (By licensed OT), PT (By licensed PT)     PT Frequency: 5x a week OT Frequency: 5x a week            Contractures Contractures Info: Not present    Additional Factors Info  Code Status Code Status Info: DNR             Current Medications (02/01/2019):  This is the current hospital active medication list Current Facility-Administered Medications  Medication Dose Route Frequency Provider Last Rate Last Dose  . 0.9 %  sodium chloride infusion (Manually program via Guardrails IV Fluids)   Intravenous Once Darliss Cheney, MD   Stopped at 01/30/19 2023  . albuterol (PROVENTIL) (2.5 MG/3ML) 0.083% nebulizer solution 2.5 mg  2.5 mg Inhalation Q6H PRN Landis Gandy  G, MD      . carvedilol (COREG) tablet 25 mg  25 mg Oral BID WC Arrien, Jimmy Picket, MD   25 mg at 02/01/19 0743  . Chlorhexidine Gluconate Cloth 2 % PADS 6 each  6 each Topical Q0600 Rigoberto Noel, MD   6 each at 02/01/19 [phone]  . docusate sodium (COLACE) capsule 100 mg  100 mg Oral BID Darliss Cheney, MD   100 mg at 02/01/19 0935  . enoxaparin (LOVENOX) injection 40 mg  40 mg Subcutaneous Q24H Arrien, Jimmy Picket, MD   40 mg at 01/31/19 1712  . folic acid (FOLVITE) tablet 1 mg  1 mg Oral Daily Arrien, Jimmy Picket, MD   1 mg at 02/01/19 1700  . furosemide (LASIX) injection 40 mg  40 mg  Intravenous TID PC Darliss Cheney, MD   40 mg at 02/01/19 1749  . heparin lock flush 100 unit/mL  500 Units Intracatheter Daily PRN Harle Stanford., PA-C      . heparin lock flush 100 unit/mL  500 Units Intracatheter Daily PRN Harle Stanford., PA-C      . ipratropium-albuterol (DUONEB) 0.5-2.5 (3) MG/3ML nebulizer solution 3 mL  3 mL Nebulization BID Darliss Cheney, MD   3 mL at 02/01/19 4496  . levothyroxine (SYNTHROID) tablet 150 mcg  150 mcg Oral Q0600 Tawni Millers, MD   150 mcg at 02/01/19 [phone]  . losartan (COZAAR) tablet 25 mg  25 mg Oral Daily Josue Hector, MD   25 mg at 02/01/19 0934  . MEDLINE mouth rinse  15 mL Mouth Rinse BID Georgette Shell, MD   15 mL at 02/01/19 6384  . methylPREDNISolone sodium succinate (SOLU-MEDROL) 125 mg/2 mL injection 60 mg  60 mg Intravenous Q6H Ollis, Brandi L, NP   60 mg at 02/01/19 0611  . milrinone (PRIMACOR) 20 MG/100 ML (0.2 mg/mL) infusion  0.25 mcg/kg/min Intravenous Continuous Rigoberto Noel, MD   Stopped at 01/31/19 1957  . mupirocin ointment (BACTROBAN) 2 % 1 application  1 application Nasal BID Rigoberto Noel, MD   1 application at 66/59/93 [phone]  . pantoprazole (PROTONIX) EC tablet 40 mg  40 mg Oral Daily Arrien, Jimmy Picket, MD   40 mg at 02/01/19 0935  . polyethylene glycol (MIRALAX / GLYCOLAX) packet 17 g  17 g Oral Daily Darliss Cheney, MD   17 g at 02/01/19 0933  . polyethylene glycol (MIRALAX / GLYCOLAX) packet 17 g  17 g Oral Daily PRN Darliss Cheney, MD      . prochlorperazine (COMPAZINE) tablet 10 mg  10 mg Oral Q6H PRN Arrien, Jimmy Picket, MD      . sodium chloride flush (NS) 0.9 % injection 10-40 mL  10-40 mL Intracatheter Q12H Darliss Cheney, MD   10 mL at 02/01/19 0939  . vitamin B-12 (CYANOCOBALAMIN) tablet 50 mcg  50 mcg Oral Daily Arrien, Jimmy Picket, MD   50 mcg at 02/01/19 7793     Discharge Medications: Please see discharge summary for a list of discharge medications.  Relevant Imaging  Results:  Relevant Lab Results:   Additional Information needs short term rehab, medicare pt, SSN [id number]  Joaquin Courts, RN

## 2019-02-01 NOTE — Progress Notes (Signed)
Name: [name] MRN: [id number] DOB: 30-Dec-1945    ADMISSION DATE:  01/22/2019 CONSULTATION DATE:  02/01/2019   REFERRING MD :  Doristine Bosworth, triad  CHIEF COMPLAINT:  Hypoxia, resp distress  BRIEF PATIENT DESCRIPTION: 73 year old woman with nonischemic cardiomyopathy, EF 25 to 30% and myelodysplastic syndrome admitted 5/12 with bilateral nodular infiltrates, COVID negative, respiratory viral panel negative. PCCM consulted for worsening infiltrates and hypoxia 5/17  SIGNIFICANT EVENTS  5/11 Admit to [facility]  5/14 PRBC transfusion x 1 5/19 PCCM consulted for nodular infiltrates  STUDIES:  ECHO 07/2018 >> diffuse hypokinesis with EF 25 to 30%, moderate MR  CTA Chest 5/12 >> diffuse bilateral patchy groundglass infiltrates-edema versus multifocal pneumonia with enlarged pulmonary artery and trace bilateral effusions  SUBJECTIVE:  Increasingly alert , now tolerating HFNC, T Max overnight 100.2, WBC 1.9, states her breathing is a bit better. She is happy to have a break from BiPAP, Net negative 5.5 L   VITAL SIGNS: Temp:  [97.3 F (36.3 C)-97.9 F (36.6 C)] 97.9 F (36.6 C) (05/21 0400) Pulse Rate:  [64-106] 75 (05/21 0800) Resp:  [10-26] 17 (05/21 0800) BP: (82-140)/(33-90) 100/43 (05/21 0739) SpO2:  [83 %-97 %] 90 % (05/21 0800) Weight:  [81.8 kg] 81.8 kg (05/21 0427)  PHYSICAL EXAMINATION: General: elderly female lying in bed on high flow [place] at 8 L HEENT: MM pink/dry, HF [place] in place with sats of 93% Neuro:Awake and alert, following commands, MAE x 4, conversant   CV: s1s2 rrr, no m/r/g, SR per tele PULM: Bilateral chest excursion,No nasal flaring or sternal retractions. lungs bilaterally with crackles per bases CB:JSEG, bsx4 active, Obese  Extremities: warm/dry, trace edema , brisk refill, no obvious deformities Skin: no rashes or lesions, warm, dry and intact  Recent Labs  Lab 01/30/19 0439 01/31/19 0759 02/01/19 0416  NA 143 142 141  K 4.2 2.9* 3.5  CL 85* 90* 91*   CO2 49* 42* 43*  BUN 26* 28* 32*  CREATININE 0.75 0.74 0.70  GLUCOSE 129* 165* 154*   Recent Labs  Lab 01/30/19 0439 01/30/19 1825 01/31/19 0408 02/01/19 0416  HGB 7.0* 6.7* 7.8* 8.4*  HCT 23.8* 22.6* 25.5* 27.1*  WBC 5.4  --  1.9* 2.3*  PLT 57*  --  52* 57*   Dg Chest Port 1 View  Result Date: 02/01/2019 CLINICAL DATA:  Respiratory failure EXAM: PORTABLE CHEST 1 VIEW COMPARISON:  01/31/2019 FINDINGS: Cardiac shadow is enlarged but stable. Defibrillator is again seen as well as a right-sided PICC line. Diffuse interstitial and airspace disease is noted stable from the prior exam consistent with congestive failure. Basilar densities remain and stable. No bony abnormality is seen. IMPRESSION: CHF and bibasilar atelectasis stable from the prior exam. Electronically Signed   By: Inez Catalina M.D.   On: 02/01/2019 07:45   Dg Chest Port 1 View  Result Date: 01/31/2019 CLINICAL DATA:  Hypoxia. EXAM: PORTABLE CHEST 1 VIEW COMPARISON:  One-view chest x-ray 01/27/2019 FINDINGS: The heart is enlarged. Extensive interstitial and airspace opacities are present. Increased densities are noted lung bases bilaterally. Pacing wires are stable. IMPRESSION: 1. Cardiomegaly with diffuse interstitial and airspace disease compatible with congestive heart failure. 2. Bibasilar consolidation. This likely reflects atelectasis. Infection is not excluded. Electronically Signed   By: San Morelle M.D.   On: 01/31/2019 11:55    ASSESSMENT / PLAN:  Acute Hypoxemic Respiratory Failure  Bilateral infiltrates - of unclear etiology, unlikely to be infectious >, PCT negative, COVID testing neg x 2.  Differential diagnosis remains acute systolic heart failure with pulmonary edema but surprisingly she has not responded to diuretics.  Less likely TRALI - last transfusion was on 5/14.  Would hold off on further transfusions unless hemoglobin less than 7.  ? Related to MDS even though recent BM biopsy did not suggest  crisis. Also, must consider Vidaza (can cause pneumonitis, ILD).  Autoimmune labs :  Sed rate 109  CRP :  20.9 RF:  12.4 ANA:  Negative dsDNA Ab: < 1 C-ANCA : < 1.20 Myeloperoxidase < 9.0 ANCA Proteinase 3: < 3.5 GBM Ab: 3 CCP Antibodies IgG/IgA: 8  CXR 02/01/2019 CHF and bibasilar atelectasis stable from the prior exam.  Plan: Continue BiPAP at bedtime / Alternate with HFNC as able Wean oxygen for sats of > 88% BiPAP for AMS or desaturations NPO while on BiPAP Sips and chips while off BiPAP Trend CXR DNR  Continue Milrinone / diuresis for now Monitor CO-Ox per cards Appreciate Cardiology input  Auto immune results noted  Continue Solumedrol 60 mg IV Q6  For now Prone for 2 hour intervals several times daily as patient tolerates  Suspected Contraction Alkalosis  -CO2 on BMP has risen from 31 to 49 - CO2  Remains elevated at 43 P: Consider lasix holiday ABG now to ensure not respiratory Alkalosis vs metabolic Consider Diamox once ABG results have been evaluated  Leukopenia WBC 2.3 on 5/21 T Max 100.2 overnight P: Trend WBC Follow auto immune work up Continue ABX per primary team  Anemia P Transfusions per primary team  Improving with Steroid, success in slowly weaning oxygen We will continue to follow   Magdalen Spatz, AGACNP-BC [place] [facility] Pgr: [phone] or if no answer [phone] 02/01/2019, 8:41 AM

## 2019-02-01 NOTE — Progress Notes (Addendum)
Initial Nutrition Assessment  DOCUMENTATION CODES:   Obesity unspecified  INTERVENTION:  - will order Ensure Enlive BID, each supplement provides 350 kcal and 20 grams of protein. - will order daily multivitamin with minerals. - continue to encourage PO intakes.    NUTRITION DIAGNOSIS:   Inadequate oral intake related to acute illness, lethargy/confusion as evidenced by per patient/family report, meal completion < 50%.  GOAL:   Patient will meet greater than or equal to 90% of their needs  MONITOR:   PO intake, Supplement acceptance, Weight trends  REASON FOR ASSESSMENT:   LOS(day #10)  ASSESSMENT:   73 year old female who presented with dyspnea and weakness. She does have significant past medical history for myelodysplastic syndrome and nonischemic cardiomyopathy systolic dysfunction. She was seen by her PCP on the day of admission (5/11), she was found hypoxic down to 80% on room air and her hemoglobin was down to 6.1, she was placed on supplemental oxygen and transferred to the hospital for further evaluation. CXR showed bilateral nodular infiltrates. COVID-19 negative. She was admitted with working diagnosis of acute hypoxic respiratory failure d/t multifocal PNA. PCCM and Cardiology were also consulted d/t worsening respiratory distress. She was found to have acute exacerbation of CHF and started on IV Lasix and milrinone. She has been on and off BiPAP.  Per review of RN flow sheet, patient was consuming 25-100% of meals (mainly 25-50%) from 5/13-5/17 and no intakes documented since 5/17. Patient reports that this AM is the first good meal she has had in a while and that she ate sausage, pancakes, a banana and drank water without any abdominal pain/pressure or nausea and no problems with chewing or swallowing.   Patient states that she previously was told not to eat "due to the fluid." Patient was very drowsy throughout visit. She had to be awaken when RD entered the room and  would nod off throughout discussion. Unsure that patient's report about rationale for not being able to eat is accurate.   Per chart review, current weight is 180 lb and weight on admission was 191 lb; lasix order in place. Weight on 4/27 was 190 lb. Continue to monitor weight trends.   Per notes: acute hypoxic respiratory failure 2/2 multifocal PNA vs acute on CHF with favoring of CHF exacerbation, COVID negative x2, autoimmune labs pending, pancytopenia.    Medications reviewed; 100 mg colace BID, 1 mg folvite/day, 40 mg IV lasix TID, 150 mg oral synthroid/day, 60 mg solu-medrol QID, 1 packet miralax/day, 40 mEq K-Dur x1 dose 5/21, 50 mcg oral cyanocobalamin/day.  Labs reviewed; Cl: 91 mmol/l, BUN: 32 mg/dl, Ca: 8.1 mg/dl.      NUTRITION - FOCUSED PHYSICAL EXAM:  completed; no muscle and no fat wasting noted; mild edema to all extremities.   Diet Order:   Diet Order            Diet regular Room service appropriate? Yes; Fluid consistency: Thin  Diet effective now              EDUCATION NEEDS:   No education needs have been identified at this time  Skin:  Skin Assessment: Reviewed RN Assessment  Last BM:  5/18  Height:   Ht Readings from Last 1 Encounters:  01/29/19 5\' 3"  (1.6 m)    Weight:   Wt Readings from Last 1 Encounters:  02/01/19 81.8 kg    Ideal Body Weight:  52.3 kg  BMI:  Body mass index is 31.95 kg/m.  Estimated Nutritional Needs:  Kcal:  1640-1880 kcal  Protein:  95-105 grams  Fluid:  >/= 1.5 L/day     Jarome Matin, MS, RD, LDN, [facility] Inpatient Clinical Dietitian Pager # [phone] After hours/weekend pager # [phone]

## 2019-02-01 NOTE — Progress Notes (Addendum)
Progress Note  Due to the COVID-19 pandemic, this visit was completed with telemedicine (audio/video) technology to reduce patient and provider exposure as well as to preserve personal protective equipment.   Patient Name: [name] Date of Encounter: 02/01/2019  Primary Cardiologist: [name], MD   Subjective   APP pre-round - patient did not answer phone, and unable to use Caregility application (do not have access). Pending MD review.  Inpatient Medications    Scheduled Meds: . sodium chloride   Intravenous Once  . carvedilol  25 mg Oral BID WC  . Chlorhexidine Gluconate Cloth  6 each Topical Q0600  . docusate sodium  100 mg Oral BID  . enoxaparin (LOVENOX) injection  40 mg Subcutaneous Q24H  . folic acid  1 mg Oral Daily  . furosemide  40 mg Intravenous TID PC  . ipratropium-albuterol  3 mL Nebulization BID  . levothyroxine  150 mcg Oral Q0600  . losartan  25 mg Oral Daily  . mouth rinse  15 mL Mouth Rinse BID  . methylPREDNISolone (SOLU-MEDROL) injection  60 mg Intravenous Q6H  . mupirocin ointment  1 application Nasal BID  . pantoprazole  40 mg Oral Daily  . polyethylene glycol  17 g Oral Daily  . sodium chloride flush  10-40 mL Intracatheter Q12H  . vitamin B-12  50 mcg Oral Daily   Continuous Infusions: . milrinone Stopped (01/31/19 1957)   PRN Meds: albuterol, heparin lock flush, heparin lock flush, polyethylene glycol, prochlorperazine   Vital Signs    Vitals:   02/01/19 0700 02/01/19 0739 02/01/19 0746 02/01/19 0800  BP: (!) 107/33 (!) 100/43    Pulse: 73 72 72 75  Resp: 15 17 16 17   Temp:      TempSrc:      SpO2: 97% 96% 94% 90%  Weight:      Height:        Intake/Output Summary (Last 24 hours) at 02/01/2019 0818 Last data filed at 02/01/2019 0800 Gross per 24 hour  Intake 77.19 ml  Output 1200 ml  Net -1122.81 ml   Last 3 Weights 02/01/2019 01/31/2019 01/30/2019  Weight (lbs) 180 lb 5.4 oz 186 lb 4.6 oz 187 lb 6.3 oz  Weight (kg)  81.8 kg 84.5 kg 85 kg      Telemetry    sinus  ECG    01/29/19 NSR with LBBB - Personally Reviewed  Physical Exam   To be performed by rounding physician  Labs    Chemistry Recent Labs  Lab 01/26/19 1006 01/27/19 0537  01/30/19 0439 01/31/19 0759 02/01/19 0416  NA 136 138   < > 143 142 141  K 3.4* 3.5   < > 4.2 2.9* 3.5  CL 88* 85*   < > 85* 90* 91*  CO2 38* 45*   < > 49* 42* 43*  GLUCOSE 156* 120*   < > 129* 165* 154*  BUN 13 14   < > 26* 28* 32*  CREATININE 0.80 0.74   < > 0.75 0.74 0.70  CALCIUM 8.1* 8.1*   < > 8.1* 8.2* 8.1*  PROT 5.9* 5.8*  --   --   --   --   ALBUMIN 2.9* 2.8*  --   --   --   --   AST 9* 9*  --   --   --   --   ALT 13 12  --   --   --   --   ALKPHOS 42  42  --   --   --   --   BILITOT 1.8* 1.9*  --   --   --   --   GFRNONAA >60 >60   < > >60 >60 >60  GFRAA >60 >60   < > >60 >60 >60  ANIONGAP 10 8   < > 9 10 7    < > = values in this interval not displayed.     Hematology Recent Labs  Lab 01/30/19 0439 01/30/19 1825 01/31/19 0408 02/01/19 0416  WBC 5.4  --  1.9* 2.3*  RBC 2.22*  --  2.43* 2.62*  HGB 7.0* 6.7* 7.8* 8.4*  HCT 23.8* 22.6* 25.5* 27.1*  MCV 107.2*  --  104.9* 103.4*  MCH 31.5  --  32.1 32.1  MCHC 29.4*  --  30.6 31.0  RDW 18.4*  --  19.5* 18.3*  PLT 57*  --  52* 57*    BNP Recent Labs  Lab 01/28/19 0322 01/28/19 1100  BNP 1,441.5* 1,421.8*     DDimer No results for input(s): DDIMER in the last 168 hours.   Radiology    Dg Chest Port 1 View  Result Date: 02/01/2019 CLINICAL DATA:  Respiratory failure EXAM: PORTABLE CHEST 1 VIEW COMPARISON:  01/31/2019 FINDINGS: [name] is enlarged but stable. Defibrillator is again seen as well as a right-sided PICC line. Diffuse interstitial and airspace disease is noted stable from the prior exam consistent with congestive failure. Basilar densities remain and stable. No bony abnormality is seen. IMPRESSION: CHF and bibasilar atelectasis stable from the prior exam.  Electronically Signed   By: Inez Catalina M.D.   On: 02/01/2019 07:45   Dg Chest Port 1 View  Result Date: 01/31/2019 CLINICAL DATA:  Hypoxia. EXAM: PORTABLE CHEST 1 VIEW COMPARISON:  One-view chest x-ray 01/27/2019 FINDINGS: The heart is enlarged. Extensive interstitial and airspace opacities are present. Increased densities are noted lung bases bilaterally. Pacing wires are stable. IMPRESSION: 1. Cardiomegaly with diffuse interstitial and airspace disease compatible with congestive heart failure. 2. Bibasilar consolidation. This likely reflects atelectasis. Infection is not excluded. Electronically Signed   By: San Morelle M.D.   On: 01/31/2019 11:55    Cardiac Studies   2D echo 01/28/19 1. The left ventricle has severely reduced systolic function, with an ejection fraction of 20-25%. The cavity size was severely dilated. Left ventricular diastolic Doppler parameters are consistent with pseudonormalization. Elevated left ventricular  end-diastolic pressure Left ventricular diffuse hypokinesis. No evidence of LV thrombus by definity contrast study.  2. The right ventricle has normal systolic function. The cavity was normal. There is no increase in right ventricular wall thickness. Right ventricular systolic pressure is moderately elevated with an estimated pressure of 54.9 mmHg.  3. Left atrial size was moderately dilated.  4. There is mild mitral annular calcification present. Mitral valve regurgitation is mild by color flow Doppler.  5. The aortic valve is tricuspid. There is trivial AR.  6. The inferior vena cava was dilated in size with <50% respiratory variability.  Patient Profile     73 y.o. 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 She does not have CRT, has been considered but deferred per Dr. Tanna Furry  last note. She was admitted 5/11 with SOB and weakness, found to have hypoxia and worsening anemia. Imaging shows bilateral infiltrates of unclear etiology - Covid testing negative x 2 -  being managed by PCCM.  Assessment & Plan    1. Acute hypoxic respiratory failure - ? Multifactorial - unclear etiology - possible PNA, HF. Covid negative x2, RVP negative. Inflammatory markers up. Now on steroids. Being co-managed by IM/PCCM.  2. Acute on chronic combined CHF - CoOx is 77 which seems to indicate stabilization of heart failure. However, she still has a significant O2 requirement requiring HFNC (not on O2 PTA) so likely multifactorial process. Her BUN is continuing to rise as well. Will review plan for Lasix and milrinone with Dr. Angelena Form. Milrinone appears to have been stopped overnight per [facility]. PCCM has been periodically dosing acetazolamide. Blood pressures have been on the softer side, particularly diastolic - if no plan to decrease Lasix may need to look at carvedilol/losartan to peel back (had to hold carvedilol a few days back). May consider transition from Lasix to spironolactone at some point given progressive LV dysfunction. Pt is -.5.5L and down 13lb from 5/18. She is now significantly below prior OP weights (previously 87.9kg in 10/2018).  3. Pancyctopenia with acute anemia - received transfusion this admission, stable today.  4. H/o ICD - at some point wonder if CRT should be considered. If patient is truly DNR, would need to consider disabling ICD function. I was unable to reach patient virtually to discuss - pending MD review.  5. Mitral regurgitation - stable by echo 01/2019.  For questions or updates, please contact [place] Please consult [email or web address] for contact info under      Signed, Charlie Pitter, PA-C  02/01/2019, 8:18 AM     I have personally seen and examined this patient. I agree with the assessment and plan as outlined above. Milrinone was stopped last night. She  is stable this am. Still receiving IV Lasix and diuresing. Not clear that her hypoxia is all due to CHF. She is on steroids per PCCM. May change to po Lasix tomorrow. No new recommendations today.   Lauree Chandler 02/01/2019 11:27 AM

## 2019-02-02 ENCOUNTER — Telehealth: Payer: Self-pay

## 2019-02-02 ENCOUNTER — Inpatient Hospital Stay (HOSPITAL_COMMUNITY): Payer: Medicare Other

## 2019-02-02 ENCOUNTER — Ambulatory Visit: Payer: Medicare Other

## 2019-02-02 DIAGNOSIS — J849 Interstitial pulmonary disease, unspecified: Secondary | ICD-10-CM

## 2019-02-02 LAB — BASIC METABOLIC PANEL
Anion gap: 4 — ABNORMAL LOW (ref 5–15)
BUN: 34 mg/dL — ABNORMAL HIGH (ref 8–23)
CO2: 47 mmol/L — ABNORMAL HIGH (ref 22–32)
Calcium: 7.9 mg/dL — ABNORMAL LOW (ref 8.9–10.3)
Chloride: 91 mmol/L — ABNORMAL LOW (ref 98–111)
Creatinine, Ser: 0.75 mg/dL (ref 0.44–1.00)
GFR calc Af Amer: >60 mL/min (ref >60)
GFR calc non Af Amer: >60 mL/min (ref >60)
Glucose, Bld: 167 mg/dL — ABNORMAL HIGH (ref 70–99)
Potassium: 4 mmol/L (ref 3.5–5.1)
Sodium: 142 mmol/L (ref 135–145)

## 2019-02-02 LAB — CBC WITH DIFFERENTIAL/PLATELET
Abs Immature Granulocytes: 0.31 10*3/uL — ABNORMAL HIGH (ref 0.00–0.07)
Basophils Absolute: 0 10*3/uL (ref 0.0–0.1)
Basophils Relative: 0 %
Eosinophils Absolute: 0 10*3/uL (ref 0.0–0.5)
Eosinophils Relative: 0 %
HCT: 27.2 % — ABNORMAL LOW (ref 36.0–46.0)
Hemoglobin: 8.4 g/dL — ABNORMAL LOW (ref 12.0–15.0)
Immature Granulocytes: 7 %
Lymphocytes Relative: 15 %
Lymphs Abs: 0.7 10*3/uL (ref 0.7–4.0)
MCH: 31.8 pg (ref 26.0–34.0)
MCHC: 30.9 g/dL (ref 30.0–36.0)
MCV: 103 fL — ABNORMAL HIGH (ref 80.0–100.0)
Monocytes Absolute: 0.6 10*3/uL (ref 0.1–1.0)
Monocytes Relative: 12 %
Neutro Abs: 3.1 10*3/uL (ref 1.7–7.7)
Neutrophils Relative %: 66 %
Platelets: 69 10*3/uL — ABNORMAL LOW (ref 150–400)
RBC: 2.64 MIL/uL — ABNORMAL LOW (ref 3.87–5.11)
RDW: 17.8 % — ABNORMAL HIGH (ref 11.5–15.5)
WBC: 4.7 10*3/uL (ref 4.0–10.5)
nRBC: 12.6 % — ABNORMAL HIGH (ref 0.0–0.2)

## 2019-02-02 LAB — COOXEMETRY PANEL
Carboxyhemoglobin: 2.2 % — ABNORMAL HIGH (ref 0.5–1.5)
Methemoglobin: 1 % (ref 0.0–1.5)
O2 Saturation: 71.7 %
Total hemoglobin: 8.7 g/dL — ABNORMAL LOW (ref 12.0–16.0)

## 2019-02-02 MED ORDER — CHLORHEXIDINE GLUCONATE CLOTH 2 % EX PADS
6.0000 | MEDICATED_PAD | Freq: Every day | CUTANEOUS | Status: DC
  Administered 2019-02-02 – 2019-02-03 (×2): 6 via TOPICAL

## 2019-02-02 NOTE — Telephone Encounter (Signed)
Chest CT order placed. Awaiting call back from patient to be put on schedule following chest ct.

## 2019-02-02 NOTE — TOC Progression Note (Signed)
Transition of Care [facility]) - Progression Note    Patient Details  Name: [name] MRN: [id number] Date of Birth: 12-Sep-1946  Transition of Care [facility]) CM/SW Contact  Joaquin Courts, RN Phone Number: 02/02/2019, 1:18 PM  Clinical Narrative:    CM followed up with patient regarding bed offers.  Offers presented to patient and patient friend [name] per pt request.  Patient and friend are not ready to accept any of the current bed offers and request search be expanded to include [place] [facility]. CM faxed FL-2 to the requested facilities, awaiting response with bed offers.     Expected Discharge Plan: Skilled Nursing Facility Barriers to Discharge: Continued Medical Work up  Expected Discharge Plan and Services Expected Discharge Plan: [place]   Discharge Planning Services: CM Consult   Living arrangements for the past 2 months: Single Family Home Expected Discharge Date: (unknown)                                     Social Determinants of Health (SDOH) Interventions    Readmission Risk Interventions Readmission Risk Prevention Plan 01/29/2019  Transportation Screening Complete  PCP or Specialist Appt within 3-5 Days Not Complete  Some recent data might be hidden

## 2019-02-02 NOTE — Telephone Encounter (Signed)
-----   Message from [place], MD sent at 02/02/2019 11:02 AM EDT ----- Regarding: RE: CT Chest and PCCM follow-up visit Without contrast. Ild ----- Message ----- From: Vivia Ewing, LPN Sent: 4/44/5848  10:58 AM EDT To: Chi Rodman Pickle, MD Subject: RE: CT Chest and PCCM follow-up visit          With or without contrast? And what diagnosis would you like to use?  ----- Message ----- From: Margaretha Seeds, MD Sent: 02/02/2019  10:17 AM EDT To: [facility] Triage Pool Subject: CT Chest and PCCM follow-up visit              Please arrange for CT Chest in 8 weeks and follow-up after imaging in obtained.  JE

## 2019-02-02 NOTE — Progress Notes (Signed)
PROGRESS NOTE    [name]  FOY:[id number] DOB: [birth year] DOA: 01/22/2019 PCP: Elby Showers, MD     Brief Narrative:  [name] is a 73 year old female who presented with dyspnea and weakness. 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 She physical examination blood pressure was 103/35, heart rate 77, respiratory rate 20, temperature 98.4, oxygen saturation 96%,her lungs were showing rhonchi bilaterally, heart S1-S2 present and rhythmic, abdomen was soft, no lower extremity edema. Sodium 132, potassium 3.6, chloride 83, bicarb 31, glucose 122, BUN 22, creatinine 1.0, white count 3.8, hemoglobin 6.1, hematocrit 19.0, platelets 65.Urinalysis negative for infection.Her chest x-ray had bilateral nodular infiltrates. SARS COVID-19 negative.  Patient was admitted to hospital working diagnosis of acute hypoxic respiratory failure due to multifocal pneumonia and was started on IV Rocephin and Zithromax. PCCM and cardiology were also consulted due to worsening respiratory distress.  She was found to have acute exacerbation of systolic heart failure and started on IV Lasix and milrinone.  She has also been on and off BiPAP.  New events last 24 hours / Subjective: No new complaints today.  No worsening shortness of breath.  On 7 to 8 L high flow nasal cannula O2 today.  Assessment & Plan:   Principal Problem:   Acute respiratory failure with hypoxia (HCC) Active Problems:   Hypothyroidism   Essential hypertension   Symptomatic anemia   MDS (myelodysplastic syndrome) (HCC)   Multifocal pneumonia   Respiratory failure (HCC)  Dyspnea   Congestive dilated cardiomyopathy (HCC)   Acute systolic heart failure (HCC)   Acute on chronic systolic CHF (congestive heart failure), NYHA class 4 (HCC)   Acute hypoxic respiratory failuresecondary to acute on chronic systolic congestive heart failure and acute lung injury -Initially thought to be infectious in etiology and was started on Rocephin and azithromycin.  Patient has been tested negative for COVID 19 x2.    Respiratory viral panel also negative.  She remains afebrile.  Now off antibiotic -Appears to be related more so due to acute exacerbation of chronic systolic congestive heart failure.  Now off milrinone.  Has been on IV Lasix, on hold today.  Cardiology following -Also related to acute lung injury.  Continue IV Solu-Medrol.  PCCM signed off 5/22 -Off BiPAP this morning, continue high flow nasal cannula O2 and wean as able -Chest x-ray reviewed independently 5/22: Diffuse interstitial edema, largely unchanged from previous  Pancytopeniasecondary to MDS  -She has received total 4u pRBC during admission  -Continue to trend CBC closely.  Stable  Hypertension -Blood pressure on the lower side.  Continue Coreg and Cozaar   Hypothyroidism -Continue Synthroid.   DVT prophylaxis: Lovenox Code Status: DNR Family Communication: None Disposition Plan: Continue above treatments for acute respiratory failure, continue to wean off oxygen as able.  SNF placement pending   Consultants:   PCCM  Cardiology  Procedures:   None  Antimicrobials:  Anti-infectives (From admission, onward)   Start     Dose/Rate Route Frequency Ordered Stop   01/25/19 1400  azithromycin (ZITHROMAX) tablet 500 mg  Status:  Discontinued     500 mg Oral Every 24 hours 01/25/19 0946 01/29/19 0847  01/23/19 1400  azithromycin (ZITHROMAX) 500 mg in sodium chloride 0.9 % 250 mL IVPB  Status:  Discontinued     500 mg 250 mL/hr over 60 Minutes Intravenous Every 24 hours 01/22/19 1428  01/25/19 0946   01/22/19 1500  cefTRIAXone (ROCEPHIN) 1 g in sodium chloride 0.9 % 100 mL IVPB  Status:  Discontinued     1 g 200 mL/hr over 30 Minutes Intravenous Every 24 hours 01/22/19 1428 01/31/19 1034   01/22/19 1300  cefTRIAXone (ROCEPHIN) 1 g in sodium chloride 0.9 % 100 mL IVPB  Status:  Discontinued     1 g 200 mL/hr over 30 Minutes Intravenous  Once 01/22/19 1258 01/22/19 1429   01/22/19 1300  azithromycin (ZITHROMAX) 500 mg in sodium chloride 0.9 % 250 mL IVPB     500 mg 250 mL/hr over 60 Minutes Intravenous  Once 01/22/19 1258 01/22/19 1431       Objective: Vitals:   02/02/19 0400 02/02/19 0500 02/02/19 0600 02/02/19 0800  BP: (!) 100/39 100/61 (!) 99/36   Pulse: 63 65 (!) 58   Resp: 15 16 11    Temp: 98.5 F (36.9 C)   98.7 F (37.1 C)  TempSrc: Oral   Oral  SpO2: 93% 96% 92%   Weight:      Height:        Intake/Output Summary (Last 24 hours) at 02/02/2019 1010 Last data filed at 02/02/2019 0600 Gross per 24 hour  Intake 240 ml  Output 1100 ml  Net -860 ml   Filed Weights   01/31/19 0411 02/01/19 0427 02/02/19 0330  Weight: 84.5 kg 81.8 kg 82 kg    Examination: General exam: Appears calm and comfortable  Respiratory system: Bibasilar crackles, no increase in respiratory effort, appears comfortable on high flow nasal cannula O2 Cardiovascular system: S1 & S2 heard, RRR. No JVD, murmurs, rubs, gallops or clicks. No pedal edema. Gastrointestinal system: Abdomen is nondistended, soft and nontender. No organomegaly or masses felt. Normal bowel sounds heard. Central nervous system: Alert and oriented. No focal neurological deficits. Extremities: Symmetric 5 x 5 power. Skin: No rashes, lesions or ulcers Psychiatry: Judgement and insight appear normal. Mood & affect appropriate.     Data Reviewed: I have personally reviewed following labs and imaging studies  CBC: Recent Labs  Lab 01/29/19 0357 01/30/19 0439 01/30/19 1825 01/31/19 0408 02/01/19 0416  02/02/19 0330  WBC 3.7* 5.4  --  1.9* 2.3* 4.7  NEUTROABS 2.2 3.4  --  1.2* 1.6* 3.1  HGB 7.2* 7.0* 6.7* 7.8* 8.4* 8.4*  HCT 24.0* 23.8* 22.6* 25.5* 27.1* 27.2*  MCV 107.6* 107.2*  --  104.9* 103.4* 103.0*  PLT 56* 57*  --  52* 57* 69*   Basic Metabolic Panel: Recent Labs  Lab 01/29/19 0357 01/30/19 0439 01/31/19 0759 02/01/19 0416 02/02/19 0330  NA 140 143 142 141 142  K 4.3 4.2 2.9* 3.5 4.0  CL 85* 85* 90* 91* 91*  CO2 46* 49* 42* 43* 47*  GLUCOSE 125* 129* 165* 154* 167*  BUN 26* 26* 28* 32* 34*  CREATININE 0.70 0.75 0.74 0.70 0.75  CALCIUM 8.0* 8.1* 8.2* 8.1* 7.9*  MG 1.9  --   --  2.4  --    GFR: Estimated Creatinine Clearance: 64.4 mL/min (by C-G formula based on SCr of 0.75 mg/dL). Liver Function Tests: Recent Labs  Lab 01/27/19 0537  AST 9*  ALT 12  ALKPHOS 42  BILITOT 1.9*  PROT 5.8*  ALBUMIN 2.8*   No  results for input(s): LIPASE, AMYLASE in the last 168 hours. No results for input(s): AMMONIA in the last 168 hours. Coagulation Profile: No results for input(s): INR, PROTIME in the last 168 hours. Cardiac Enzymes: No results for input(s): CKTOTAL, CKMB, CKMBINDEX, TROPONINI in the last 168 hours. BNP (last 3 results) No results for input(s): PROBNP in the last 8760 hours. HbA1C: No results for input(s): HGBA1C in the last 72 hours. CBG: No results for input(s): GLUCAP in the last 168 hours. Lipid Profile: No results for input(s): CHOL, HDL, LDLCALC, TRIG, CHOLHDL, LDLDIRECT in the last 72 hours. Thyroid Function Tests: No results for input(s): TSH, T4TOTAL, FREET4, T3FREE, THYROIDAB in the last 72 hours. Anemia Panel: No results for input(s): VITAMINB12, FOLATE, FERRITIN, TIBC, IRON, RETICCTPCT in the last 72 hours. Sepsis Labs: No results for input(s): PROCALCITON, LATICACIDVEN in the last 168 hours.  Recent Results (from the past 240 hour(s))  Respiratory Panel by PCR     Status: None   Collection Time: 01/25/19  1:12 PM  Result Value Ref  Range Status   Adenovirus NOT DETECTED NOT DETECTED Final   Coronavirus 229E NOT DETECTED NOT DETECTED Final    Comment: (NOTE) The Coronavirus on the Respiratory Panel, DOES NOT test for the novel  Coronavirus (2019 nCoV)    Coronavirus HKU1 NOT DETECTED NOT DETECTED Final   Coronavirus NL63 NOT DETECTED NOT DETECTED Final   Coronavirus OC43 NOT DETECTED NOT DETECTED Final   Metapneumovirus NOT DETECTED NOT DETECTED Final   Rhinovirus / Enterovirus NOT DETECTED NOT DETECTED Final   Influenza A NOT DETECTED NOT DETECTED Final   Influenza B NOT DETECTED NOT DETECTED Final   Parainfluenza Virus 1 NOT DETECTED NOT DETECTED Final   Parainfluenza Virus 2 NOT DETECTED NOT DETECTED Final   Parainfluenza Virus 3 NOT DETECTED NOT DETECTED Final   Parainfluenza Virus 4 NOT DETECTED NOT DETECTED Final   Respiratory Syncytial Virus NOT DETECTED NOT DETECTED Final   Bordetella pertussis NOT DETECTED NOT DETECTED Final   Chlamydophila pneumoniae NOT DETECTED NOT DETECTED Final   Mycoplasma pneumoniae NOT DETECTED NOT DETECTED Final    Comment: Performed at [facility], [place] [address], [place], [place] [address]  SARS Coronavirus 2 (CEPHEID- Performed in [place] hospital lab), Hosp Order     Status: None   Collection Time: 01/28/19  7:42 AM  Result Value Ref Range Status   SARS Coronavirus 2 NEGATIVE NEGATIVE Final    Comment: (NOTE) If result is NEGATIVE SARS-CoV-2 target nucleic acids are NOT DETECTED. The SARS-CoV-2 RNA is generally detectable in upper and lower  respiratory specimens during the acute phase of infection. The lowest  concentration of SARS-CoV-2 viral copies this assay can detect is 250  copies / mL. A negative result does not preclude SARS-CoV-2 infection  and should not be used as the sole basis for treatment or other  patient management decisions.  A negative result may occur with  improper specimen collection / handling, submission of specimen other  than  nasopharyngeal swab, presence of viral mutation(s) within the  areas targeted by this assay, and inadequate number of viral copies  (<250 copies / mL). A negative result must be combined with clinical  observations, patient history, and epidemiological information. If result is POSITIVE SARS-CoV-2 target nucleic acids are DETECTED. The SARS-CoV-2 RNA is generally detectable in upper and lower  respiratory specimens dur ing the acute phase of infection.  Positive  results are indicative of active infection with SARS-CoV-2.  Clinical  correlation with patient history and other diagnostic information is  necessary to determine patient infection status.  Positive results do  not rule out bacterial infection or co-infection with other viruses. If result is PRESUMPTIVE POSTIVE SARS-CoV-2 nucleic acids MAY BE PRESENT.   A presumptive positive result was obtained on the submitted specimen  and confirmed on repeat testing.  While 2019 novel coronavirus  (SARS-CoV-2) nucleic acids may be present in the submitted sample  additional confirmatory testing may be necessary for epidemiological  and / or clinical management purposes  to differentiate between  SARS-CoV-2 and other Sarbecovirus currently known to infect humans.  If clinically indicated additional testing with an alternate test  methodology [phone]) is advised. The SARS-CoV-2 RNA is generally  detectable in upper and lower respiratory sp ecimens during the acute  phase of infection. The expected result is Negative. Fact Sheet for Patients:  StrictlyIdeas.no Fact Sheet for Healthcare Providers: BankingDealers.co.za This test is not yet approved or cleared by the [place] FDA and has been authorized for detection and/or diagnosis of SARS-CoV-2 by FDA under an Emergency Use Authorization (EUA).  This EUA will remain in effect (meaning this test can be used) for the duration of the  COVID-19 declaration under Section 564(b)(1) of the Act, 21 U.S.C. section 360bbb-3(b)(1), unless the authorization is terminated or revoked sooner. Performed at [facility], [place] [address], [place], [place] [address]   MRSA PCR Screening     Status: Abnormal   Collection Time: 01/29/19  9:40 AM  Result Value Ref Range Status   MRSA by PCR POSITIVE (A) NEGATIVE Final    Comment:        The GeneXpert MRSA Assay (FDA approved for NASAL specimens only), is one component of a comprehensive MRSA colonization surveillance program. It is not intended to diagnose MRSA infection nor to guide or monitor treatment for MRSA infections. RESULT CALLED TO, READ BACK BY AND VERIFIED WITH: H.MORRIS,RN [id number] @1137  BY V.WILKINS Performed at [facility] [address] [place], [place] [address]        Radiology Studies: Dg Chest Port 1 View  Result Date: 02/02/2019 CLINICAL DATA:  Respiratory failure. EXAM: PORTABLE CHEST 1 VIEW COMPARISON:  Radiograph of Feb 01, 2019. FINDINGS: Stable cardiomegaly. Left-sided pacemaker is unchanged in position. Right-sided PICC line is unchanged. No pneumothorax is noted. Stable bilateral lung opacities are noted concerning for edema or possibly pneumonia. Mild left pleural effusion is noted. Bony thorax is unremarkable. IMPRESSION: Stable bilateral lung opacities are noted concerning for edema or possibly pneumonia. Mild left pleural effusion is noted. Electronically Signed   By: Marijo Conception M.D.   On: 02/02/2019 08:18   Dg Chest Port 1 View  Result Date: 02/01/2019 CLINICAL DATA:  Respiratory failure EXAM: PORTABLE CHEST 1 VIEW COMPARISON:  01/31/2019 FINDINGS: Cardiac shadow is enlarged but stable. Defibrillator is again seen as well as a right-sided PICC line. Diffuse interstitial and airspace disease is noted stable from the prior exam consistent with congestive failure. Basilar densities remain and stable. No bony  abnormality is seen. IMPRESSION: CHF and bibasilar atelectasis stable from the prior exam. Electronically Signed   By: Inez Catalina M.D.   On: 02/01/2019 07:45      Scheduled Meds: . sodium chloride   Intravenous Once  . carvedilol  25 mg Oral BID WC  . Chlorhexidine Gluconate Cloth  6 each Topical Daily  . docusate sodium  100 mg Oral BID  . enoxaparin (  LOVENOX) injection  40 mg Subcutaneous Q24H  . feeding supplement (ENSURE ENLIVE)  237 mL Oral BID BM  . folic acid  1 mg Oral Daily  . ipratropium-albuterol  3 mL Nebulization BID  . levothyroxine  150 mcg Oral Q0600  . losartan  25 mg Oral Daily  . mouth rinse  15 mL Mouth Rinse BID  . methylPREDNISolone (SOLU-MEDROL) injection  60 mg Intravenous Q6H  . multivitamin with minerals  1 tablet Oral Daily  . mupirocin ointment  1 application Nasal BID  . pantoprazole  40 mg Oral Daily  . polyethylene glycol  17 g Oral Daily  . sodium chloride flush  10-40 mL Intracatheter Q12H  . vitamin B-12  50 mcg Oral Daily   Continuous Infusions:    LOS: 11 days    Time spent: 47minutes   Dessa Phi, DO [facility] [email or web address] 02/02/2019, 10:10 AM

## 2019-02-02 NOTE — Progress Notes (Signed)
Progress Note  Patient Name: [name] Date of Encounter: 02/02/2019  Primary Cardiologist: [name], MD   Subjective   Breathing is better. No pain  Inpatient Medications    Scheduled Meds: . sodium chloride   Intravenous Once  . carvedilol  25 mg Oral BID WC  . Chlorhexidine Gluconate Cloth  6 each Topical Daily  . docusate sodium  100 mg Oral BID  . enoxaparin (LOVENOX) injection  40 mg Subcutaneous Q24H  . feeding supplement (ENSURE ENLIVE)  237 mL Oral BID BM  . folic acid  1 mg Oral Daily  . furosemide  40 mg Intravenous TID PC  . ipratropium-albuterol  3 mL Nebulization BID  . levothyroxine  150 mcg Oral Q0600  . losartan  25 mg Oral Daily  . mouth rinse  15 mL Mouth Rinse BID  . methylPREDNISolone (SOLU-MEDROL) injection  60 mg Intravenous Q6H  . multivitamin with minerals  1 tablet Oral Daily  . mupirocin ointment  1 application Nasal BID  . pantoprazole  40 mg Oral Daily  . polyethylene glycol  17 g Oral Daily  . sodium chloride flush  10-40 mL Intracatheter Q12H  . vitamin B-12  50 mcg Oral Daily   Continuous Infusions: . milrinone Stopped (01/31/19 1957)   PRN Meds: albuterol, heparin lock flush, heparin lock flush, polyethylene glycol, prochlorperazine   Vital Signs    Vitals:   02/02/19 0330 02/02/19 0400 02/02/19 0500 02/02/19 0600  BP:  (!) 100/39 100/61 (!) 99/36  Pulse:  63 65 (!) 58  Resp:  15 16 11   Temp:  98.5 F (36.9 C)    TempSrc:  Oral    SpO2:  93% 96% 92%  Weight: 82 kg     Height:        Intake/Output Summary (Last 24 hours) at 02/02/2019 0728 Last data filed at 02/02/2019 0600 Gross per 24 hour  Intake 544.15 ml  Output 1100 ml  Net -555.85 ml   Last 3 Weights 02/02/2019 02/01/2019 01/31/2019  Weight (lbs) 180 lb 12.4 oz 180 lb 5.4 oz 186 lb 4.6 oz  Weight (kg) 82 kg 81.8 kg 84.5 kg      Telemetry    NSR  ECG    No AM EKG.   Physical Exam   General: Well developed, well nourished, appears comfortable  HEENT: OP clear, mucus membranes moist  SKIN: warm, dry. No rashes. Neuro: No focal deficits  Musculoskeletal: Muscle strength 5/5 all ext  Psychiatric: Mood and affect normal  Neck: No JVD, no carotid bruits, no thyromegaly, no lymphadenopathy.  Lungs: coarse BS bases.  Cardiovascular: Regular rate and rhythm. No murmurs, gallops or rubs. Abdomen:Soft. Bowel sounds present. Non-tender.  Extremities: No lower extremity edema. Pulses are 2 + in the bilateral DP/PT.   Labs    Chemistry Recent Labs  Lab 01/26/19 1006 01/27/19 0537  01/31/19 0759 02/01/19 0416 02/02/19 0330  NA 136 138   < > 142 141 142  K 3.4* 3.5   < > 2.9* 3.5 4.0  CL 88* 85*   < > 90* 91* 91*  CO2 38* 45*   < > 42* 43* 47*  GLUCOSE 156* 120*   < > 165* 154* 167*  BUN 13 14   < > 28* 32* 34*  CREATININE 0.80 0.74   < > 0.74 0.70 0.75  CALCIUM 8.1* 8.1*   < > 8.2* 8.1* 7.9*  PROT 5.9* 5.8*  --   --   --   --  ALBUMIN 2.9* 2.8*  --   --   --   --   AST 9* 9*  --   --   --   --   ALT 13 12  --   --   --   --   ALKPHOS 42 42  --   --   --   --   BILITOT 1.8* 1.9*  --   --   --   --   GFRNONAA >60 >60   < > >60 >60 >60  GFRAA >60 >60   < > >60 >60 >60  ANIONGAP 10 8   < > 10 7 4*   < > = values in this interval not displayed.     Hematology Recent Labs  Lab 01/31/19 0408 02/01/19 0416 02/02/19 0330  WBC 1.9* 2.3* 4.7  RBC 2.43* 2.62* 2.64*  HGB 7.8* 8.4* 8.4*  HCT 25.5* 27.1* 27.2*  MCV 104.9* 103.4* 103.0*  MCH 32.1 32.1 31.8  MCHC 30.6 31.0 30.9  RDW 19.5* 18.3* 17.8*  PLT 52* 57* 69*    BNP Recent Labs  Lab 01/28/19 0322 01/28/19 1100  BNP 1,441.5* 1,421.8*     DDimer No results for input(s): DDIMER in the last 168 hours.   Radiology    Dg Chest Port 1 View  Result Date: 02/01/2019 CLINICAL DATA:  Respiratory failure EXAM: PORTABLE CHEST 1 VIEW COMPARISON:  01/31/2019 FINDINGS: Cardiac shadow is enlarged but stable. Defibrillator is again seen as well as a right-sided PICC line.  Diffuse interstitial and airspace disease is noted stable from the prior exam consistent with congestive failure. Basilar densities remain and stable. No bony abnormality is seen. IMPRESSION: CHF and bibasilar atelectasis stable from the prior exam. Electronically Signed   By: Inez Catalina M.D.   On: 02/01/2019 07:45   Dg Chest Port 1 View  Result Date: 01/31/2019 CLINICAL DATA:  Hypoxia. EXAM: PORTABLE CHEST 1 VIEW COMPARISON:  One-view chest x-ray 01/27/2019 FINDINGS: The heart is enlarged. Extensive interstitial and airspace opacities are present. Increased densities are noted lung bases bilaterally. Pacing wires are stable. IMPRESSION: 1. Cardiomegaly with diffuse interstitial and airspace disease compatible with congestive heart failure. 2. Bibasilar consolidation. This likely reflects atelectasis. Infection is not excluded. Electronically Signed   By: [place] More[place] M.D.   On: 01/31/2019 11:55    Cardiac Studies   2D echo 01/28/19 1. The left ventricle has severely reduced systolic function, with an ejection fraction of 20-25%. The cavity size was severely dilated. Left ventricular diastolic Doppler parameters are consistent with pseudonormalization. Elevated left ventricular  end-diastolic pressure Left ventricular diffuse hypokinesis. No evidence of LV thrombus by definity contrast study.  2. The right ventricle has normal systolic function. The cavity was normal. There is no increase in right ventricular wall thickness. Right ventricular systolic pressure is moderately elevated with an estimated pressure of 54.9 mmHg.  3. Left atrial size was moderately dilated.  4. There is mild mitral annular calcification present. Mitral valve regurgitation is mild by color flow Doppler.  5. The aortic valve is tricuspid. There is trivial AR.  6. The inferior vena cava was dilated in size with <50% respiratory variability.  Patient Profile     73 y.o. 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 She does not have CRT, has been considered but deferred per Dr. Tanna Furry last note. She was admitted  5/11 with SOB and weakness, found to have hypoxia and worsening anemia. Imaging shows bilateral infiltrates of unclear etiology - Covid testing negative x 2 - being managed by PCCM. She has diuresed with IV Lasix with modest improvement in dyspnea/hypoxia.   Assessment & Plan    1. Acute hypoxic respiratory failure: Likely due to CHF although she was also started on steroids since the cause was unclear and there was no initial improvement in respiratory status with diuresis alone. PCCM following  2. Acute on chronic combined CHF: CoOx is 72 this am. BUN rising. She is negative 6.1 liters. I would hold Lasix today. Continue Coreg and ARB. Will consider starting aldactone this weekend as BP tolerates.   3. Pancyctopenia with acute anemia - received transfusion this admission, stable today.  4. Mitral regurgitation - stable by echo 01/2019.  For questions or updates, please contact [place] Please consult [email or web address] for contact info under      Signed, Lauree Chandler, MD  02/02/2019, 7:28 AM

## 2019-02-02 NOTE — Progress Notes (Signed)
Physical Therapy Treatment Patient Details Name: [name] MRN: [id number] DOB: 1946-08-07 Today's Date: 02/02/2019    History of Present Illness 73 yo female admitted to ED on 5/11 with ShOB, hypoxia, with PNA sent from [facility]. Transferred to ICU-on BIPAP vs HFNC. 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 PMH includes pacemaker, GERD, EF 25-30% 2010, MVR, cardiomyopathy, depression, CHF.     PT Comments    Pt resting upon PT arrival, requested bed-level PT session this day. Pt performed multiple general strengthening LE exercises with assist from PT, and  pt sat EOB ~3 minutes, nonproductive cough x2 with sats 87-93% on 7LO2 via HFNC. Pt with accessory muscle breathing with mobility, pt instructed to breathe through nose to recover sats. PT to continue to follow acutely, pt to benefit from SNF placement given mobility and respiratory status at this time.    Follow Up Recommendations  SNF;Home health PT;Supervision/Assistance - 24 hour     Equipment Recommendations  None recommended by PT    Recommendations for Other Services       Precautions / Restrictions Precautions Precautions: Fall Precaution Comments: watch sats: HFNC Restrictions Weight Bearing Restrictions: No    Mobility  Bed Mobility Overal bed mobility: Needs Assistance Bed Mobility: Supine to Sit     Supine to sit: Min assist;HOB elevated Sit to supine: HOB elevated;Min assist   General bed mobility comments: Min assist for sit<>supine for trunk management, positioning in bed with use of bedpads upon return to bed. Pt sat EOB ~3 minutes, nonproductive cough x2 with sats 87-93% on 7LO2 via HFNC  Transfers Overall transfer level: (NT - pt defers due to fatigue.)                  Ambulation/Gait                 Stairs             Wheelchair Mobility    Modified Rankin (Stroke Patients Only)       Balance Overall balance assessment: Needs assistance Sitting-balance support: No upper extremity  supported;Feet supported Sitting balance-Leahy Scale: Fair                                      Cognition Arousal/Alertness: Awake/alert Behavior During Therapy: WFL for tasks assessed/performed Overall Cognitive Status: No family/caregiver present to determine baseline cognitive functioning                                        Exercises General Exercises - Lower Extremity Ankle Circles/Pumps: Both;AROM;10 reps;Supine Quad Sets: AROM;Both;10 reps;Supine Long Arc Quad: AROM;Both;10 reps;Seated Hip ABduction/ADduction: AROM;Both;10 reps;Supine Straight Leg Raises: AAROM;Both;10 reps;Supine    General Comments        Pertinent Vitals/Pain Pain Assessment: No/denies pain    Home Living                      Prior Function            PT Goals (current goals can now be found in the care plan section) Acute Rehab PT Goals Patient Stated Goal: go home, breathe better PT Goal Formulation: With patient Time For Goal Achievement: 02/06/19 Potential to Achieve Goals: Good Progress towards PT goals: Progressing toward goals    Frequency    Min 3X/week  PT Plan Discharge plan needs to be updated    Co-evaluation              AM-PAC PT "6 Clicks" Mobility   Outcome Measure  Help needed turning from your back to your side while in a flat bed without using bedrails?: A Little Help needed moving from lying on your back to sitting on the side of a flat bed without using bedrails?: A Little Help needed moving to and from a bed to a chair (including a wheelchair)?: A Lot Help needed standing up from a chair using your arms (e.g., wheelchair or bedside chair)?: A Lot Help needed to walk in hospital room?: A Lot Help needed climbing 3-5 steps with a railing? : A Lot 6 Click Score: 14    End of Session Equipment Utilized During Treatment: Oxygen Activity Tolerance: Patient limited by fatigue Patient left: with call  bell/phone within reach;in bed Nurse Communication: Mobility status PT Visit Diagnosis: Muscle weakness (generalized) (M62.81);Difficulty in walking, not elsewhere classified (R26.2);Unsteadiness on feet (R26.81)     Time: 6226-3335 PT Time Calculation (min) (ACUTE ONLY): 11 min  Charges:  $Therapeutic Exercise: 8-22 mins                     Tiffannie Sloss Conception Chancy, PT Acute Rehabilitation Services Pager [phone]  Office [phone]   Roxine Caddy D Elonda Husky 02/02/2019, 2:31 PM

## 2019-02-02 NOTE — Telephone Encounter (Signed)
-----   Message from Chit[place], MD sent at 02/02/2019 10:17 AM EDT ----- Regarding: CT Chest and PCCM follow-up visit Please arrange for CT Chest in 8 weeks and follow-up after imaging in obtained.  JE

## 2019-02-02 NOTE — TOC Progression Note (Signed)
Transition of Care [facility]) - Progression Note    Patient Details  Name: ASHELEY HELLBERG MRN: [id number] Date of Birth: [birth year]  Transition of [facility] CM/SW Contact  Joaquin Courts, RN Phone Number: 02/02/2019, 4:26 PM  Clinical Narrative:    CM received call from pt friend [name], who is assisting pt with decision making for SNF.  Requests three additional facilities be added to SNF search.  With permission, pt info was sent out to [facility], [facility], and [facility].  [facility] and [facility] are not in [place], information faxed manually. Admission reps for facilities state will review on Monday.     Expected Discharge Plan: Skilled Nursing Facility Barriers to Discharge: Continued Medical Work up  Expected Discharge Plan and Services Expected Discharge Plan: [facility]   Discharge Planning Services: CM Consult   Living arrangements for the past 2 months: Single Family Home Expected Discharge Date: (unknown)                                     Social Determinants of Health (SDOH) Interventions    Readmission Risk Interventions Readmission Risk Prevention Plan 01/29/2019  Transportation Screening Complete  PCP or Specialist Appt within 3-5 Days Not Complete  Some recent data might be hidden

## 2019-02-02 NOTE — Telephone Encounter (Signed)
LMTCB

## 2019-02-02 NOTE — Progress Notes (Signed)
Name: [name] MRN: [id number] DOB: 12-14-1945    ADMISSION DATE:  01/22/2019 CONSULTATION DATE:  02/02/2019   REFERRING MD :  Doristine Bosworth, triad  CHIEF COMPLAINT:  Hypoxia, resp distress  BRIEF PATIENT DESCRIPTION: 73 year old woman with nonischemic cardiomyopathy, EF 25 to 30% and myelodysplastic syndrome admitted 5/12 with bilateral nodular infiltrates, COVID negative, respiratory viral panel negative. PCCM consulted for worsening infiltrates and hypoxia 5/17  SIGNIFICANT EVENTS  5/11 Admit to [facility]  5/11 SARS COV 2 negative  5/14 PRBC transfusion x 1 5/17 SARS Cov 2 Negative  5/19 PCCM consulted for nodular infiltrates  STUDIES:  ECHO 07/2018 >> diffuse hypokinesis with EF 25 to 30%, moderate MR  CTA Chest 5/12 >> diffuse bilateral patchy groundglass infiltrates-edema versus multifocal pneumonia with enlarged pulmonary artery and trace bilateral effusions  CXR 5/22> bilateral airspace disease, stable from prior. Bibasilar opacities stable from prior   SUBJECTIVE:  Oxygen requirements continue to decrease, did not require BiPAP overnight.  Says she is "feeling okay" and that her breathing "feels a lot better than before."   VITAL SIGNS: Temp:  [97.7 F (36.5 C)-98.5 F (36.9 C)] 98.5 F (36.9 C) (05/22 0400) Pulse Rate:  [58-87] 58 (05/22 0600) Resp:  [11-21] 11 (05/22 0600) BP: (86-107)/(21-84) 99/36 (05/22 0600) SpO2:  [87 %-100 %] 92 % (05/22 0600) Weight:  [82 kg] 82 kg (05/22 0330)   PHYSICAL EXAMINATION: General: Elderly appearing female, reclined in bed on 7LNC in NAD  HEENT: NCAT trachea midline. Pink tacky mucus membranes. Patent nares, [place] in place  Neuro: AAO. Following commands. PERRL  CV: RRR s1s2 no rgm no JVD  PULM: Symmetrical chest excursion. No accessory muscle recruitment on 7LNC. Bibasilar crackles.  GI: Soft, round, normoactive x4  Extremities: Symmetrical bulk and tone. Trace BLE edema. No obvious deformity. No cyanosis, no clubbing  Skin:  clean, dry, warm, without rash   Recent Labs  Lab 01/31/19 0759 02/01/19 0416 02/02/19 0330  NA 142 141 142  K 2.9* 3.5 4.0  CL 90* 91* 91*  CO2 42* 43* 47*  BUN 28* 32* 34*  CREATININE 0.74 0.70 0.75  GLUCOSE 165* 154* 167*   Recent Labs  Lab 01/31/19 0408 02/01/19 0416 02/02/19 0330  HGB 7.8* 8.4* 8.4*  HCT 25.5* 27.1* 27.2*  WBC 1.9* 2.3* 4.7  PLT 52* 57* 69*   Dg Chest Port 1 View  Result Date: 02/01/2019 CLINICAL DATA:  Respiratory failure EXAM: PORTABLE CHEST 1 VIEW COMPARISON:  01/31/2019 FINDINGS: Cardiac shadow is enlarged but stable. Defibrillator is again seen as well as a right-sided PICC line. Diffuse interstitial and airspace disease is noted stable from the prior exam consistent with congestive failure. Basilar densities remain and stable. No bony abnormality is seen. IMPRESSION: CHF and bibasilar atelectasis stable from the prior exam. Electronically Signed   By: Inez Catalina M.D.   On: 02/01/2019 07:45   Dg Chest Port 1 View  Result Date: 01/31/2019 CLINICAL DATA:  Hypoxia. EXAM: PORTABLE CHEST 1 VIEW COMPARISON:  One-view chest x-ray 01/27/2019 FINDINGS: The heart is enlarged. Extensive interstitial and airspace opacities are present. Increased densities are noted lung bases bilaterally. Pacing wires are stable. IMPRESSION: 1. Cardiomegaly with diffuse interstitial and airspace disease compatible with congestive heart failure. 2. Bibasilar consolidation. This likely reflects atelectasis. Infection is not excluded. Electronically Signed   By: San Morelle M.D.   On: 01/31/2019 11:55    ASSESSMENT / PLAN:  Acute Hypoxic Respiratory Failure  Bilateral Airspace Disease  -COVID 19  negative x2, RVP negative -DDx  Acute combined HF, TRALI (last transfusion 5/14), medication related pnuemonitis vs ILD (Vidaza), MDS (recent BM biopsy did not suggest crisis)  -Most likely acute on chronic CHF exacerbation   Autoimmune labs :  Sed rate 109  CRP :  20.9 RF:   12.4 ANA:  Negative dsDNA Ab: < 1 C-ANCA : < 1.20 Myeloperoxidase < 9.0 ANCA Proteinase 3: < 3.5 GBM Ab: 3 CCP Antibodies IgG/IgA: 8  Plan: -Patient is DNR -Cardiology following, appreciate input and management of diuresis, co-ox. Discussed with cardiology 5/22: planning to have lasix holiday today  -Nocturnal BiPAP/HFNC as needed. Has not been requiring BiPAP.  -Sips/Chips while off BiPAP. NPO when on BiPAP -SpO2 goal 88-92%, titrate O2 support as needed  -NPO while on BiPAP -Continue Solumedrol 60mg  q6  -Consider prone positioning q2hrs as patient tolerates  -IS q1 hour while awake -PRN CXR while inpatient  -Recommend repeat CT chest in 8 weeks   Hypochloremic Alkalosis -suspect contraction alkalosis due to diuresis  -CO2 continues to rise, now 47 on BMP P: -5/22 Cardiology planning to hold Lasix  -Monitor on BMP  Leukopenia, improving Anemia Thrombocytopenia -history MDS WBC 2.3 to 4.7 Afebrile overnight  Of note patient is receiving systemic steroids  P: -Continue to trend WBC, temperature -Transfusions per primary team   Hypotension -hypovolemia vs sepsis vs cardiac etiology P -Cardiology planning to hold Lasix today as above -Continue to trend VS per unit protocol  -Patient mentation is appropriate, patient is AAO. Do not feel that aggressive interventions for hypotension are needed at this time  -5/22 AM Coreg held  -Management of anti-hypertensive medications per primary team    Rest Per Primary  Thank you for consulting PCCM for this patient case. The patient's acute hypoxemic respiratory failure is improving. PCCM will sign off. Please re-involve as needed.     Eliseo Gum MSN, AGACNP-BC [place] [phone] If no answer, [phone] 02/02/2019, 7:11 AM

## 2019-02-03 DIAGNOSIS — I5043 Acute on chronic combined systolic (congestive) and diastolic (congestive) heart failure: Secondary | ICD-10-CM

## 2019-02-03 LAB — CBC WITH DIFFERENTIAL/PLATELET
Abs Immature Granulocytes: 0.72 10*3/uL — ABNORMAL HIGH (ref 0.00–0.07)
Basophils Absolute: 0 10*3/uL (ref 0.0–0.1)
Basophils Relative: 0 %
Eosinophils Absolute: 0 10*3/uL (ref 0.0–0.5)
Eosinophils Relative: 0 %
HCT: 28.3 % — ABNORMAL LOW (ref 36.0–46.0)
Hemoglobin: 8.6 g/dL — ABNORMAL LOW (ref 12.0–15.0)
Immature Granulocytes: 11 %
Lymphocytes Relative: 17 %
Lymphs Abs: 1.2 10*3/uL (ref 0.7–4.0)
MCH: 31.5 pg (ref 26.0–34.0)
MCHC: 30.4 g/dL (ref 30.0–36.0)
MCV: 103.7 fL — ABNORMAL HIGH (ref 80.0–100.0)
Monocytes Absolute: 0.8 10*3/uL (ref 0.1–1.0)
Monocytes Relative: 12 %
Neutro Abs: 4.1 10*3/uL (ref 1.7–7.7)
Neutrophils Relative %: 60 %
Platelets: 70 10*3/uL — ABNORMAL LOW (ref 150–400)
RBC: 2.73 MIL/uL — ABNORMAL LOW (ref 3.87–5.11)
RDW: 17.2 % — ABNORMAL HIGH (ref 11.5–15.5)
WBC: 6.9 10*3/uL (ref 4.0–10.5)
nRBC: 13.2 % — ABNORMAL HIGH (ref 0.0–0.2)

## 2019-02-03 LAB — BASIC METABOLIC PANEL
Anion gap: 7 (ref 5–15)
BUN: 26 mg/dL — ABNORMAL HIGH (ref 8–23)
CO2: 43 mmol/L — ABNORMAL HIGH (ref 22–32)
Calcium: 8.7 mg/dL — ABNORMAL LOW (ref 8.9–10.3)
Chloride: 91 mmol/L — ABNORMAL LOW (ref 98–111)
Creatinine, Ser: 0.62 mg/dL (ref 0.44–1.00)
GFR calc Af Amer: >60 mL/min (ref >60)
GFR calc non Af Amer: >60 mL/min (ref >60)
Glucose, Bld: 170 mg/dL — ABNORMAL HIGH (ref 70–99)
Potassium: 4.4 mmol/L (ref 3.5–5.1)
Sodium: 141 mmol/L (ref 135–145)

## 2019-02-03 LAB — COOXEMETRY PANEL
Carboxyhemoglobin: 2.2 % — ABNORMAL HIGH (ref 0.5–1.5)
Methemoglobin: 0.8 % (ref 0.0–1.5)
O2 Saturation: 71.8 %
Total hemoglobin: 8.8 g/dL — ABNORMAL LOW (ref 12.0–16.0)

## 2019-02-03 MED ORDER — SPIRONOLACTONE 25 MG PO TABS
25.0000 mg | ORAL_TABLET | Freq: Every day | ORAL | Status: DC
  Administered 2019-02-03 – 2019-02-05 (×3): 25 mg via ORAL
  Filled 2019-02-03 (×3): qty 1

## 2019-02-03 NOTE — Progress Notes (Signed)
Progress Note  Patient Name: [name] Date of Encounter: 02/03/2019  Primary Cardiologist: [name], MD   Subjective   73 yo with hx of CHF admitted with respiratory failure  Has diuresed net 4.5 liters so far this admission . Oc-ox remains stable at 72.    Inpatient Medications    Scheduled Meds: . sodium chloride   Intravenous Once  . carvedilol  25 mg Oral BID WC  . Chlorhexidine Gluconate Cloth  6 each Topical Daily  . docusate sodium  100 mg Oral BID  . enoxaparin (LOVENOX) injection  40 mg Subcutaneous Q24H  . feeding supplement (ENSURE ENLIVE)  237 mL Oral BID BM  . folic acid  1 mg Oral Daily  . ipratropium-albuterol  3 mL Nebulization BID  . levothyroxine  150 mcg Oral Q0600  . losartan  25 mg Oral Daily  . mouth rinse  15 mL Mouth Rinse BID  . methylPREDNISolone (SOLU-MEDROL) injection  60 mg Intravenous Q6H  . multivitamin with minerals  1 tablet Oral Daily  . pantoprazole  40 mg Oral Daily  . polyethylene glycol  17 g Oral Daily  . sodium chloride flush  10-40 mL Intracatheter Q12H  . vitamin B-12  50 mcg Oral Daily   Continuous Infusions:  PRN Meds: albuterol, heparin lock flush, heparin lock flush, prochlorperazine   Vital Signs    Vitals:   02/03/19 0700 02/03/19 0730 02/03/19 0800 02/03/19 0810  BP: (!) 109/44 (!) 130/38    Pulse: (!) 58 (!) 59    Resp: 12 16    Temp:   98.7 F (37.1 C)   TempSrc:   Oral   SpO2: 97% 90%  92%  Weight:      Height:        Intake/Output Summary (Last 24 hours) at 02/03/2019 0842 Last data filed at 02/03/2019 0740 Gross per 24 hour  Intake 480 ml  Output 500 ml  Net -20 ml   Last 3 Weights 02/03/2019 02/02/2019 02/01/2019  Weight (lbs) 184 lb 1.4 oz 180 lb 12.4 oz 180 lb 5.4 oz  Weight (kg) 83.5 kg 82 kg 81.8 kg      Telemetry    NSR  - Personally Reviewed  ECG     NSR  - Personally Reviewed  Physical Exam   GEN: No acute distress.  Elderly female,  NAD  Neck: No JVD Cardiac: RRR, no  murmurs, rubs, or gallops.  Respiratory:  bilateral rales in the bases  GI: Soft, nontender, non-distended  MS: No edema; No deformity. Neuro:  Nonfocal  Psych: Normal affect   Labs    Chemistry Recent Labs  Lab 02/01/19 0416 02/02/19 0330 02/03/19 0430  NA 141 142 141  K 3.5 4.0 4.4  CL 91* 91* 91*  CO2 43* 47* 43*  GLUCOSE 154* 167* 170*  BUN 32* 34* 26*  CREATININE 0.70 0.75 0.62  CALCIUM 8.1* 7.9* 8.7*  GFRNONAA >60 >60 >60  GFRAA >60 >60 >60  ANIONGAP 7 4* 7     Hematology Recent Labs  Lab 02/01/19 0416 02/02/19 0330 02/03/19 0430  WBC 2.3* 4.7 6.9  RBC 2.62* 2.64* 2.73*  HGB 8.4* 8.4* 8.6*  HCT 27.1* 27.2* 28.3*  MCV 103.4* 103.0* 103.7*  MCH 32.1 31.8 31.5  MCHC 31.0 30.9 30.4  RDW 18.3* 17.8* 17.2*  PLT 57* 69* 70*    Cardiac EnzymesNo results for input(s): TROPONINI in the last 168 hours. No results for input(s): TROPIPOC in the last  168 hours.   BNP Recent Labs  Lab 01/28/19 0322 01/28/19 1100  BNP 1,441.5* 1,421.8*     DDimer No results for input(s): DDIMER in the last 168 hours.   Radiology    Dg Chest Port 1 View  Result Date: 02/02/2019 CLINICAL DATA:  Respiratory failure. EXAM: PORTABLE CHEST 1 VIEW COMPARISON:  Radiograph of Feb 01, 2019. FINDINGS: Stable cardiomegaly. Left-sided pacemaker is unchanged in position. Right-sided PICC line is unchanged. No pneumothorax is noted. Stable bilateral lung opacities are noted concerning for edema or possibly pneumonia. Mild left pleural effusion is noted. Bony thorax is unremarkable. IMPRESSION: Stable bilateral lung opacities are noted concerning for edema or possibly pneumonia. Mild left pleural effusion is noted. Electronically Signed   By: Marijo Conception M.D.   On: 02/02/2019 08:18    Cardiac Studies     Patient Profile     73 y.o. female with acute on chronic combined CHF admitted with respiratory failure   Assessment & Plan    1.  Acute on chronic combined chf:   Continue meds.  Will add aldactone 25 mg a day today  Continue coreg, losartan Restart lasix tomorrow if bp remains stable  Co-ox looks good   2. Pancytopenia - s/p transfusions,    For questions or updates, please contact [place] Please consult [email or web address] for contact info under        Signed, [name], MD  02/03/2019, 8:42 AM

## 2019-02-03 NOTE — Progress Notes (Signed)
Report called to [facility] RN on 4th intermediate. Pt transferred via bed to rm [id number].

## 2019-02-03 NOTE — Progress Notes (Signed)
PROGRESS NOTE    [name]  NUU:[id number] DOB: 11-07-1945 DOA: 01/22/2019 PCP: Elby Showers, MD     Brief Narrative:  [name] is a 73 year old female who presented with dyspnea and weakness. 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 She physical examination blood pressure was 103/35, heart rate 77, respiratory rate 20, temperature 98.4, oxygen saturation 96%,her lungs were showing rhonchi bilaterally, heart S1-S2 present and rhythmic, abdomen was soft, no lower extremity edema. Sodium 132, potassium 3.6, chloride 83, bicarb 31, glucose 122, BUN 22, creatinine 1.0, white count 3.8, hemoglobin 6.1, hematocrit 19.0, platelets 65.Urinalysis negative for infection.Her chest x-ray had bilateral nodular infiltrates. SARS COVID-19 negative.  Patient was admitted to hospital working diagnosis of acute hypoxic respiratory failure due to multifocal pneumonia and was started on IV Rocephin and Zithromax. PCCM and cardiology were also consulted due to worsening respiratory distress.  She was found to have acute exacerbation of systolic heart failure and started on IV Lasix and milrinone.  She has also been on and off BiPAP.  New events last 24 hours / Subjective: Doing well this morning, without any acute complaints.  She remains on 3 L of nasal cannula O2, satting in the mid 80s during our conversation.  She does not appear to be in any acute respiratory distress.  Awaiting SNF placement.  Assessment & Plan:   Principal Problem:   Acute respiratory failure with hypoxia (HCC) Active Problems:   Hypothyroidism   Essential  hypertension   Symptomatic anemia   MDS (myelodysplastic syndrome) (HCC)   Multifocal pneumonia   Respiratory failure (HCC)   Dyspnea   Congestive dilated cardiomyopathy (HCC)   Acute systolic heart failure (HCC)   Acute on chronic systolic CHF (congestive heart failure), NYHA class 4 (HCC)   Acute hypoxic respiratory failuresecondary to acute on chronic systolic congestive heart failure and acute lung injury -Initially thought to be infectious in etiology and was started on Rocephin and azithromycin.  Patient has been tested negative for COVID 19 x2.    Respiratory viral panel also negative.  She remains afebrile.  Now off antibiotic -Appears to be related more so due to acute exacerbation of chronic systolic congestive heart failure.  Now off milrinone.  Has been on IV Lasix, on hold today.  Cardiology following.  Spironolactone added -Also related to acute lung injury.  Continue IV Solu-Medrol x 5 days.  PCCM signed off 5/22 -Off BiPAP this morning, continue high flow nasal cannula O2 and wean as able -Repeat outpatient CT in 8 weeks and follow-up pulmonary outpatient  Pancytopeniasecondary to MDS  -She has received total 4u pRBC during admission  -Continue to trend CBC closely.  Stable  Hypertension -Blood pressure on the lower side.  Continue Coreg and Cozaar   Hypothyroidism -Continue Synthroid.   DVT prophylaxis: Lovenox Code Status: DNR Family Communication: None Disposition Plan: Continue above treatments for acute respiratory failure, continue to wean off oxygen as able.  SNF placement pending   Consultants:   PCCM  Cardiology  Procedures:   None  Antimicrobials:  Anti-infectives (From admission, onward)   Start     Dose/Rate Route Frequency Ordered Stop   01/25/19  1400  azithromycin (ZITHROMAX) tablet 500 mg  Status:  Discontinued     500 mg Oral Every 24 hours 01/25/19 0946 01/29/19 0847   01/23/19 1400  azithromycin (ZITHROMAX) 500 mg in sodium  chloride 0.9 % 250 mL IVPB  Status:  Discontinued     500 mg 250 mL/hr over 60 Minutes Intravenous Every 24 hours 01/22/19 1428 01/25/19 0946   01/22/19 1500  cefTRIAXone (ROCEPHIN) 1 g in sodium chloride 0.9 % 100 mL IVPB  Status:  Discontinued     1 g 200 mL/hr over 30 Minutes Intravenous Every 24 hours 01/22/19 1428 01/31/19 1034   01/22/19 1300  cefTRIAXone (ROCEPHIN) 1 g in sodium chloride 0.9 % 100 mL IVPB  Status:  Discontinued     1 g 200 mL/hr over 30 Minutes Intravenous  Once 01/22/19 1258 01/22/19 1429   01/22/19 1300  azithromycin (ZITHROMAX) 500 mg in sodium chloride 0.9 % 250 mL IVPB     500 mg 250 mL/hr over 60 Minutes Intravenous  Once 01/22/19 1258 01/22/19 1431       Objective: Vitals:   02/03/19 0700 02/03/19 0730 02/03/19 0800 02/03/19 0810  BP: (!) 109/44 (!) 130/38    Pulse: (!) 58 (!) 59    Resp: 12 16    Temp:   98.7 F (37.1 C)   TempSrc:   Oral   SpO2: 97% 90%  92%  Weight:      Height:        Intake/Output Summary (Last 24 hours) at 02/03/2019 1135 Last data filed at 02/03/2019 0740 Gross per 24 hour  Intake 480 ml  Output 500 ml  Net -20 ml   Filed Weights   02/01/19 0427 02/02/19 0330 02/03/19 0149  Weight: 81.8 kg 82 kg 83.5 kg    Examination: General exam: Appears calm and comfortable  Respiratory system: Bibasilar crackles without any respiratory distress, no increase in respiratory effort Cardiovascular system: S1 & S2 heard, RRR. No JVD, murmurs, rubs, gallops or clicks. No pedal edema. Gastrointestinal system: Abdomen is nondistended, soft and nontender. No organomegaly or masses felt. Normal bowel sounds heard. Central nervous system: Alert and oriented. No focal neurological deficits. Extremities: Symmetric 5 x 5 power. Skin: No rashes, lesions or ulcers Psychiatry: Judgement and insight appear normal. Mood & affect appropriate.    Data Reviewed: I have personally reviewed following labs and imaging studies  CBC: Recent Labs   Lab 01/30/19 0439 01/30/19 1825 01/31/19 0408 02/01/19 0416 02/02/19 0330 02/03/19 0430  WBC 5.4  --  1.9* 2.3* 4.7 6.9  NEUTROABS 3.4  --  1.2* 1.6* 3.1 4.1  HGB 7.0* 6.7* 7.8* 8.4* 8.4* 8.6*  HCT 23.8* 22.6* 25.5* 27.1* 27.2* 28.3*  MCV 107.2*  --  104.9* 103.4* 103.0* 103.7*  PLT 57*  --  52* 57* 69* 70*   Basic Metabolic Panel: Recent Labs  Lab 01/29/19 0357 01/30/19 0439 01/31/19 0759 02/01/19 0416 02/02/19 0330 02/03/19 0430  NA 140 143 142 141 142 141  K 4.3 4.2 2.9* 3.5 4.0 4.4  CL 85* 85* 90* 91* 91* 91*  CO2 46* 49* 42* 43* 47* 43*  GLUCOSE 125* 129* 165* 154* 167* 170*  BUN 26* 26* 28* 32* 34* 26*  CREATININE 0.70 0.75 0.74 0.70 0.75 0.62  CALCIUM 8.0* 8.1* 8.2* 8.1* 7.9* 8.7*  MG 1.9  --   --  2.4  --   --    GFR: Estimated Creatinine Clearance: 65 mL/min (by C-G formula based on SCr  of 0.62 mg/dL). Liver Function Tests: No results for input(s): AST, ALT, ALKPHOS, BILITOT, PROT, ALBUMIN in the last 168 hours. No results for input(s): LIPASE, AMYLASE in the last 168 hours. No results for input(s): AMMONIA in the last 168 hours. Coagulation Profile: No results for input(s): INR, PROTIME in the last 168 hours. Cardiac Enzymes: No results for input(s): CKTOTAL, CKMB, CKMBINDEX, TROPONINI in the last 168 hours. BNP (last 3 results) No results for input(s): PROBNP in the last 8760 hours. HbA1C: No results for input(s): HGBA1C in the last 72 hours. CBG: No results for input(s): GLUCAP in the last 168 hours. Lipid Profile: No results for input(s): CHOL, HDL, LDLCALC, TRIG, CHOLHDL, LDLDIRECT in the last 72 hours. Thyroid Function Tests: No results for input(s): TSH, T4TOTAL, FREET4, T3FREE, THYROIDAB in the last 72 hours. Anemia Panel: No results for input(s): VITAMINB12, FOLATE, FERRITIN, TIBC, IRON, RETICCTPCT in the last 72 hours. Sepsis Labs: No results for input(s): PROCALCITON, LATICACIDVEN in the last 168 hours.  Recent Results (from the past 240  hour(s))  Respiratory Panel by PCR     Status: None   Collection Time: 01/25/19  1:12 PM  Result Value Ref Range Status   Adenovirus NOT DETECTED NOT DETECTED Final   Coronavirus 229E NOT DETECTED NOT DETECTED Final    Comment: (NOTE) The Coronavirus on the Respiratory Panel, DOES NOT test for the novel  Coronavirus (2019 nCoV)    Coronavirus HKU1 NOT DETECTED NOT DETECTED Final   Coronavirus NL63 NOT DETECTED NOT DETECTED Final   Coronavirus OC43 NOT DETECTED NOT DETECTED Final   Metapneumovirus NOT DETECTED NOT DETECTED Final   Rhinovirus / Enterovirus NOT DETECTED NOT DETECTED Final   Influenza A NOT DETECTED NOT DETECTED Final   Influenza B NOT DETECTED NOT DETECTED Final   Parainfluenza Virus 1 NOT DETECTED NOT DETECTED Final   Parainfluenza Virus 2 NOT DETECTED NOT DETECTED Final   Parainfluenza Virus 3 NOT DETECTED NOT DETECTED Final   Parainfluenza Virus 4 NOT DETECTED NOT DETECTED Final   Respiratory Syncytial Virus NOT DETECTED NOT DETECTED Final   Bordetella pertussis NOT DETECTED NOT DETECTED Final   Chlamydophila pneumoniae NOT DETECTED NOT DETECTED Final   Mycoplasma pneumoniae NOT DETECTED NOT DETECTED Final    Comment: Performed at [facility], [place] [address] [place], [place] [address]  SARS Coronavirus 2 (CEPHEID- Performed in [facility] hospital lab), Hosp Order     Status: None   Collection Time: 01/28/19  7:42 AM  Result Value Ref Range Status   SARS Coronavirus 2 NEGATIVE NEGATIVE Final    Comment: (NOTE) If result is NEGATIVE SARS-CoV-2 target nucleic acids are NOT DETECTED. The SARS-CoV-2 RNA is generally detectable in upper and lower  respiratory specimens during the acute phase of infection. The lowest  concentration of SARS-CoV-2 viral copies this assay can detect is 250  copies / mL. A negative result does not preclude SARS-CoV-2 infection  and should not be used as the sole basis for treatment or other  patient management decisions.  A  negative result may occur with  improper specimen collection / handling, submission of specimen other  than nasopharyngeal swab, presence of viral mutation(s) within the  areas targeted by this assay, and inadequate number of viral copies  (<250 copies / mL). A negative result must be combined with clinical  observations, patient history, and epidemiological information. If result is POSITIVE SARS-CoV-2 target nucleic acids are DETECTED. The SARS-CoV-2 RNA is generally detectable in upper and lower  respiratory specimens dur ing the acute phase of infection.  Positive  results are indicative of active infection with SARS-CoV-2.  Clinical  correlation with patient history and other diagnostic information is  necessary to determine patient infection status.  Positive results do  not rule out bacterial infection or co-infection with other viruses. If result is PRESUMPTIVE POSTIVE SARS-CoV-2 nucleic acids MAY BE PRESENT.   A presumptive positive result was obtained on the submitted specimen  and confirmed on repeat testing.  While 2019 novel coronavirus  (SARS-CoV-2) nucleic acids may be present in the submitted sample  additional confirmatory testing may be necessary for epidemiological  and / or clinical management purposes  to differentiate between  SARS-CoV-2 and other Sarbecovirus currently known to infect humans.  If clinically indicated additional testing with an alternate test  methodology [phone]) is advised. The SARS-CoV-2 RNA is generally  detectable in upper and lower respiratory sp ecimens during the acute  phase of infection. The expected result is Negative. Fact Sheet for Patients:  StrictlyIdeas.no Fact Sheet for Healthcare Providers: BankingDealers.co.za This test is not yet approved or cleared by the [place] FDA and has been authorized for detection and/or diagnosis of SARS-CoV-2 by FDA under an Emergency Use  Authorization (EUA).  This EUA will remain in effect (meaning this test can be used) for the duration of the COVID-19 declaration under Section 564(b)(1) of the Act, 21 U.S.C. section 360bbb-3(b)(1), unless the authorization is terminated or revoked sooner. Performed at [facility], [place] [address] [place], [place] [address]   MRSA PCR Screening     Status: Abnormal   Collection Time: 01/29/19  9:40 AM  Result Value Ref Range Status   MRSA by PCR POSITIVE (A) NEGATIVE Final    Comment:        The GeneXpert MRSA Assay (FDA approved for NASAL specimens only), is one component of a comprehensive MRSA colonization surveillance program. It is not intended to diagnose MRSA infection nor to guide or monitor treatment for MRSA infections. RESULT CALLED TO, READ BACK BY AND VERIFIED WITH: H.MORRIS,RN [id number] @1137  BY V.WILKINS Performed at [facility] [address] [place], [place] [address]        Radiology Studies: Dg Chest Port 1 View  Result Date: 02/02/2019 CLINICAL DATA:  Respiratory failure. EXAM: PORTABLE CHEST 1 VIEW COMPARISON:  Radiograph of Feb 01, 2019. FINDINGS: Stable cardiomegaly. Left-sided pacemaker is unchanged in position. Right-sided PICC line is unchanged. No pneumothorax is noted. Stable bilateral lung opacities are noted concerning for edema or possibly pneumonia. Mild left pleural effusion is noted. Bony thorax is unremarkable. IMPRESSION: Stable bilateral lung opacities are noted concerning for edema or possibly pneumonia. Mild left pleural effusion is noted. Electronically Signed   By: Marijo Conception M.D.   On: 02/02/2019 08:18      Scheduled Meds: . sodium chloride   Intravenous Once  . carvedilol  25 mg Oral BID WC  . Chlorhexidine Gluconate Cloth  6 each Topical Daily  . docusate sodium  100 mg Oral BID  . enoxaparin (LOVENOX) injection  40 mg Subcutaneous Q24H  . feeding supplement (ENSURE ENLIVE)  237 mL  Oral BID BM  . folic acid  1 mg Oral Daily  . ipratropium-albuterol  3 mL Nebulization BID  . levothyroxine  150 mcg Oral Q0600  . losartan  25 mg Oral Daily  . mouth rinse  15 mL Mouth Rinse BID  . methylPREDNISolone (SOLU-MEDROL) injection  60 mg Intravenous  Q6H  . multivitamin with minerals  1 tablet Oral Daily  . pantoprazole  40 mg Oral Daily  . polyethylene glycol  17 g Oral Daily  . sodium chloride flush  10-40 mL Intracatheter Q12H  . spironolactone  25 mg Oral Daily  . vitamin B-12  50 mcg Oral Daily   Continuous Infusions:    LOS: 12 days    Time spent: 30minutes   Dessa Phi, DO [facility] [email or web address] 02/03/2019, 11:35 AM

## 2019-02-03 NOTE — Progress Notes (Signed)
Received pt from ICU/SD, pt ambulated to the bed. Tolerated well. Tele #[id number] placed, Purewick, PAS hose applied. Generalized edema note. Pt slightly flushed, otherwise skin dry. Denies pain O2 applied at 4 liters sats 88-91%. SRP, RN

## 2019-02-04 DIAGNOSIS — I10 Essential (primary) hypertension: Secondary | ICD-10-CM

## 2019-02-04 LAB — CBC WITH DIFFERENTIAL/PLATELET
Abs Immature Granulocytes: 1.22 10*3/uL — ABNORMAL HIGH (ref 0.00–0.07)
Basophils Absolute: 0 10*3/uL (ref 0.0–0.1)
Basophils Relative: 0 %
Eosinophils Absolute: 0 10*3/uL (ref 0.0–0.5)
Eosinophils Relative: 0 %
HCT: 28.6 % — ABNORMAL LOW (ref 36.0–46.0)
Hemoglobin: 8.6 g/dL — ABNORMAL LOW (ref 12.0–15.0)
Immature Granulocytes: 13 %
Lymphocytes Relative: 16 %
Lymphs Abs: 1.4 10*3/uL (ref 0.7–4.0)
MCH: 31 pg (ref 26.0–34.0)
MCHC: 30.1 g/dL (ref 30.0–36.0)
MCV: 103.2 fL — ABNORMAL HIGH (ref 80.0–100.0)
Monocytes Absolute: 1.2 10*3/uL — ABNORMAL HIGH (ref 0.1–1.0)
Monocytes Relative: 12 %
Neutro Abs: 5.4 10*3/uL (ref 1.7–7.7)
Neutrophils Relative %: 59 %
Platelets: 77 10*3/uL — ABNORMAL LOW (ref 150–400)
RBC: 2.77 MIL/uL — ABNORMAL LOW (ref 3.87–5.11)
RDW: 17.5 % — ABNORMAL HIGH (ref 11.5–15.5)
WBC: 9.3 10*3/uL (ref 4.0–10.5)
nRBC: 11.9 % — ABNORMAL HIGH (ref 0.0–0.2)

## 2019-02-04 LAB — BASIC METABOLIC PANEL
Anion gap: 7 (ref 5–15)
BUN: 25 mg/dL — ABNORMAL HIGH (ref 8–23)
CO2: 41 mmol/L — ABNORMAL HIGH (ref 22–32)
Calcium: 9.1 mg/dL (ref 8.9–10.3)
Chloride: 92 mmol/L — ABNORMAL LOW (ref 98–111)
Creatinine, Ser: 0.61 mg/dL (ref 0.44–1.00)
GFR calc Af Amer: >60 mL/min (ref >60)
GFR calc non Af Amer: >60 mL/min (ref >60)
Glucose, Bld: 174 mg/dL — ABNORMAL HIGH (ref 70–99)
Potassium: 4.6 mmol/L (ref 3.5–5.1)
Sodium: 140 mmol/L (ref 135–145)

## 2019-02-04 LAB — COOXEMETRY PANEL
Carboxyhemoglobin: 1.8 % — ABNORMAL HIGH (ref 0.5–1.5)
Methemoglobin: 1 % (ref 0.0–1.5)
O2 Saturation: 72 %
Total hemoglobin: 11.5 g/dL — ABNORMAL LOW (ref 12.0–16.0)

## 2019-02-04 MED ORDER — SACUBITRIL-VALSARTAN 24-26 MG PO TABS
1.0000 | ORAL_TABLET | Freq: Two times a day (BID) | ORAL | Status: DC
  Administered 2019-02-04 – 2019-02-08 (×7): 1 via ORAL
  Filled 2019-02-04 (×8): qty 1

## 2019-02-04 NOTE — Progress Notes (Addendum)
Progress Note  Patient Name: [name] Date of Encounter: 02/04/2019  Primary Cardiologist: [name], MD   Subjective   73 yo with hx of CHF admitted with respiratory failure  Has diuresed net 4.5 liters so far this admission . Co-ox remains stable at 72/   Creatinine is stable,  Remains anemic Diuresed net 5.5 liters so far this admission    Inpatient Medications    Scheduled Meds: . sodium chloride   Intravenous Once  . carvedilol  25 mg Oral BID WC  . Chlorhexidine Gluconate Cloth  6 each Topical Daily  . docusate sodium  100 mg Oral BID  . enoxaparin (LOVENOX) injection  40 mg Subcutaneous Q24H  . feeding supplement (ENSURE ENLIVE)  237 mL Oral BID BM  . folic acid  1 mg Oral Daily  . ipratropium-albuterol  3 mL Nebulization BID  . levothyroxine  150 mcg Oral Q0600  . losartan  25 mg Oral Daily  . mouth rinse  15 mL Mouth Rinse BID  . methylPREDNISolone (SOLU-MEDROL) injection  60 mg Intravenous Q6H  . multivitamin with minerals  1 tablet Oral Daily  . pantoprazole  40 mg Oral Daily  . polyethylene glycol  17 g Oral Daily  . sodium chloride flush  10-40 mL Intracatheter Q12H  . spironolactone  25 mg Oral Daily  . vitamin B-12  50 mcg Oral Daily   Continuous Infusions:  PRN Meds: albuterol, heparin lock flush, heparin lock flush, prochlorperazine   Vital Signs    Vitals:   02/03/19 1914 02/04/19 0500 02/04/19 0527 02/04/19 0726  BP:   (!) 115/52   Pulse:   (!) 58   Resp:   15   Temp:   98.1 F (36.7 C)   TempSrc:   Oral   SpO2: 94%  96% 94%  Weight:  84.2 kg    Height:        Intake/Output Summary (Last 24 hours) at 02/04/2019 0828 Last data filed at 02/04/2019 0526 Gross per 24 hour  Intake 120 ml  Output 1450 ml  Net -1330 ml   Last 3 Weights 02/04/2019 02/03/2019 02/02/2019  Weight (lbs) 185 lb 10 oz 184 lb 1.4 oz 180 lb 12.4 oz  Weight (kg) 84.2 kg 83.5 kg 82 kg      Telemetry    NSR- Personally Reviewed  ECG    -  Personally Reviewed  Physical Exam   Physical Exam: Blood pressure (!) 115/52, pulse (!) 58, temperature 98.1 F (36.7 C), temperature source Oral, resp. rate 15, height 5\' 3"  (1.6 m), weight 84.2 kg, SpO2 94 %.  GEN:   Elderly female, NAD  HEENT: Normal NECK: No JVD; No carotid bruits LYMPHATICS: No lymphadenopathy CARDIAC: RRR , no murmurs, rubs, gallops RESPIRATORY:  Rales in both bases.   ABDOMEN: Soft, non-tender, non-distended MUSCULOSKELETAL:  No edema; No deformity  SKIN: Warm and dry NEUROLOGIC:  Alert and oriented x 3   Labs    Chemistry Recent Labs  Lab 02/02/19 0330 02/03/19 0430 02/04/19 0447  NA 142 141 140  K 4.0 4.4 4.6  CL 91* 91* 92*  CO2 47* 43* 41*  GLUCOSE 167* 170* 174*  BUN 34* 26* 25*  CREATININE 0.75 0.62 0.61  CALCIUM 7.9* 8.7* 9.1  GFRNONAA >60 >60 >60  GFRAA >60 >60 >60  ANIONGAP 4* 7 7     Hematology Recent Labs  Lab 02/02/19 0330 02/03/19 0430 02/04/19 0447  WBC 4.7 6.9 9.3  RBC 2.64*  2.73* 2.77*  HGB 8.4* 8.6* 8.6*  HCT 27.2* 28.3* 28.6*  MCV 103.0* 103.7* 103.2*  MCH 31.8 31.5 31.0  MCHC 30.9 30.4 30.1  RDW 17.8* 17.2* 17.5*  PLT 69* 70* 77*    Cardiac EnzymesNo results for input(s): TROPONINI in the last 168 hours. No results for input(s): TROPIPOC in the last 168 hours.   BNP Recent Labs  Lab 01/28/19 1100  BNP 1,421.8*     DDimer No results for input(s): DDIMER in the last 168 hours.   Radiology    No results found.  Cardiac Studies     Patient Profile     73 y.o. female with acute on chronic combined CHF admitted with respiratory failure   Assessment & Plan    1.  Acute on chronic combined chf:    .  BP is stable  Feeling better  Still has rales in bases.   Cont coreg 25 BID Change losartan to Entresto 24-26 BID .  Added spironolactone yesterday,  If she become hypotensive with the addition of entresto, will hold or decrease the spironolactone.  She is making great progress .   2.  Pancytopenia - s/p transfusions,  3.  HTN:   BP is well controlled.   On the ow side ,  Will follow closely   For questions or updates, please contact [place] Please consult [email or web address] for contact info under        Signed, [name], MD  02/04/2019, 8:28 AM

## 2019-02-04 NOTE — Progress Notes (Signed)
Physical Therapy Treatment Patient Details Name: [name] MRN: [id number] DOB: November 09, 1945 Today's Date: 02/04/2019    History of Present Illness 73 yo female admitted to ED on 5/11 with ShOB, hypoxia, with PNA sent from MD. Transferred to ICU-on BIPAP vs HFNC. 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 PMH includes pacemaker, GERD, EF 25-30% 2010, MVR, cardiomyopathy, depression, CHF.     PT Comments    Progressing with mobility. O2 sat readings: 94% on 4L [place] at rest; 87% on 6L [place] during session; 90% on 4L [place] end of session. Continue to recommend ST rehab at [facility].    Follow Up Recommendations  SNF     Equipment Recommendations  None recommended by PT    Recommendations for Other Services       Precautions / Restrictions Precautions Precautions: Fall Precaution Comments: monitor O2    Mobility  Bed Mobility Overal bed mobility: Needs Assistance Bed Mobility: Supine to Sit     Supine to sit: Min assist;HOB elevated     General bed mobility comments: Assist to get to EOB. Cues for safety.   Transfers Overall transfer level: Needs assistance Equipment used: Rolling walker (2 wheeled) Transfers: Sit to/from Stand Sit to Stand: Min assist         General transfer comment: Assist to rise, stabilize, control descent. VCs safety, technique, hand placement.   Ambulation/Gait Ambulation/Gait assistance: Min assist;+2 safety/equipment Gait Distance (Feet): 15 Feet(x2) Assistive device: Rolling walker (2 wheeled) Gait Pattern/deviations: Step-through pattern;Decreased stride length     General Gait Details: Assist to stabilize pt throughout distance. Cues for safety, proper use of RW. O2 sat 87% on 6L [place] during ambulation.    Stairs             Wheelchair Mobility    Modified Rankin (Stroke Patients Only)       Balance Overall balance assessment: Needs assistance         Standing balance support: Bilateral upper extremity supported Standing balance-Leahy Scale:  Poor                              Cognition Arousal/Alertness: Awake/alert Behavior During Therapy: WFL for tasks assessed/performed Overall Cognitive Status: No family/caregiver present to determine baseline cognitive functioning                                 General Comments: mostly WFL      Exercises      General Comments        Pertinent Vitals/Pain Pain Assessment: No/denies pain    Home Living                      Prior Function            PT Goals (current goals can now be found in the care plan section) Progress towards PT goals: Progressing toward goals    Frequency    Min 3X/week      PT Plan Current plan remains appropriate    Co-evaluation              AM-PAC PT "6 Clicks" Mobility   Outcome Measure  Help needed turning from your back to your side while in a flat bed without using bedrails?: A Little Help needed moving from lying on your back to sitting on the side of a flat bed without using bedrails?:  A Little Help needed moving to and from a bed to a chair (including a wheelchair)?: A Little Help needed standing up from a chair using your arms (e.g., wheelchair or bedside chair)?: A Little Help needed to walk in hospital room?: A Lot Help needed climbing 3-5 steps with a railing? : A Lot 6 Click Score: 16    End of Session Equipment Utilized During Treatment: Oxygen;Gait belt Activity Tolerance: Patient tolerated treatment well Patient left: in chair;with call bell/phone within reach;with chair alarm set   PT Visit Diagnosis: Muscle weakness (generalized) (M62.81);Difficulty in walking, not elsewhere classified (R26.2);Unsteadiness on feet (R26.81)     Time: 2595-6387 PT Time Calculation (min) (ACUTE ONLY): 16 min  Charges:  $Gait Training: 8-22 mins                        Weston Anna, PT Acute Rehabilitation Services Pager: [phone] Office: [phone]

## 2019-02-04 NOTE — Progress Notes (Signed)
PROGRESS NOTE    ALENI ANDRUS  DJM:[id number] DOB: 1946-05-10 DOA: 01/22/2019 PCP: Elby Showers, MD     Brief Narrative:  [name] is a 73 year old female who presented with dyspnea and weakness. 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 She physical examination blood pressure was 103/35, heart rate 77, respiratory rate 20, temperature 98.4, oxygen saturation 96%,her lungs were showing rhonchi bilaterally, heart S1-S2 present and rhythmic, abdomen was soft, no lower extremity edema. Sodium 132, potassium 3.6, chloride 83, bicarb 31, glucose 122, BUN 22, creatinine 1.0, white count 3.8, hemoglobin 6.1, hematocrit 19.0, platelets 65.Urinalysis negative for infection.Her chest x-ray had bilateral nodular infiltrates. SARS COVID-19 negative.  Patient was admitted to hospital working diagnosis of acute hypoxic respiratory failure due to multifocal pneumonia and was started on IV Rocephin and Zithromax. PCCM and cardiology were also consulted due to worsening respiratory distress.  She was found to have acute exacerbation of systolic heart failure and started on IV Lasix and milrinone.  She has also been on and off BiPAP.  New events last 24 hours / Subjective: Patient eating breakfast in bed this morning.  No acute events overnight, doing well overall.  Awaiting SNF placement.  Currently on nasal cannula O2, 4 L  Assessment & Plan:   Principal Problem:   Acute respiratory failure with hypoxia (HCC) Active Problems:   Hypothyroidism   Essential hypertension   Symptomatic anemia   MDS (myelodysplastic syndrome) (HCC)  Multifocal pneumonia   Respiratory failure (HCC)   Dyspnea   Congestive dilated cardiomyopathy (HCC)   Acute systolic heart failure (HCC)   Acute on chronic systolic CHF (congestive heart failure), NYHA class 4 (HCC)   Acute hypoxic respiratory failuresecondary to acute on chronic systolic congestive heart failure and acute lung injury -Initially thought to be infectious in etiology and was started on Rocephin and azithromycin.  Patient has been tested negative for COVID 19 x2.    Respiratory viral panel also negative.  She remains afebrile.  Now off antibiotic -Appears to be related more so due to acute exacerbation of chronic systolic congestive heart failure.  Now off milrinone.  Has been on IV Lasix, on hold today.  Cardiology following.  Spironolactone and Entresto added -Also related to acute lung injury.  Continue IV Solu-Medrol x 5 days.  PCCM signed off 5/22 -On 4 L nasal cannula O2 this morning, continue to wean as able, may need oxygen when discharged to SNF -Repeat outpatient CT in 8 weeks and follow-up pulmonary outpatient  Pancytopeniasecondary to MDS  -She has received total 4u pRBC during admission  -Continue to trend CBC closely.  Stable  Hypertension -Blood pressure on the lower side.  Continue Coreg and Entresto, spironolactone  Hypothyroidism -Continue Synthroid.   DVT prophylaxis: Lovenox Code Status: DNR Family Communication: None Disposition Plan: Continue above treatments for acute respiratory failure, continue to wean off oxygen as able.  SNF placement pending   Consultants:   PCCM  Cardiology  Procedures:   None  Antimicrobials:  Anti-infectives (From admission, onward)   Start     Dose/Rate Route Frequency Ordered Stop   01/25/19 1400  azithromycin (ZITHROMAX) tablet 500 mg  Status:  Discontinued     500 mg Oral Every 24 hours 01/25/19 0946 01/29/19 0847   01/23/19 1400  azithromycin (ZITHROMAX) 500 mg in sodium chloride 0.9 % 250 mL  IVPB  Status:  Discontinued     500 mg 250 mL/hr over 60 Minutes Intravenous Every 24 hours 01/22/19 1428 01/25/19 0946   01/22/19 1500  cefTRIAXone (ROCEPHIN) 1 g in sodium chloride 0.9 % 100 mL IVPB  Status:  Discontinued     1 g 200 mL/hr over 30 Minutes Intravenous Every 24 hours 01/22/19 1428 01/31/19 1034   01/22/19 1300  cefTRIAXone (ROCEPHIN) 1 g in sodium chloride 0.9 % 100 mL IVPB  Status:  Discontinued     1 g 200 mL/hr over 30 Minutes Intravenous  Once 01/22/19 1258 01/22/19 1429   01/22/19 1300  azithromycin (ZITHROMAX) 500 mg in sodium chloride 0.9 % 250 mL IVPB     500 mg 250 mL/hr over 60 Minutes Intravenous  Once 01/22/19 1258 01/22/19 1431       Objective: Vitals:   02/04/19 0500 02/04/19 0527 02/04/19 0726 02/04/19 1018  BP:  (!) 115/52  (!) 117/51  Pulse:  (!) 58  65  Resp:  15    Temp:  98.1 F (36.7 C)    TempSrc:  Oral    SpO2:  96% 94%   Weight: 84.2 kg     Height:        Intake/Output Summary (Last 24 hours) at 02/04/2019 1104 Last data filed at 02/04/2019 0526 Gross per 24 hour  Intake -  Output 1450 ml  Net -1450 ml   Filed Weights   02/02/19 0330 02/03/19 0149 02/04/19 0500  Weight: 82 kg 83.5 kg 84.2 kg    Examination: General exam: Appears calm and comfortable  Respiratory system: Bilateral crackles without increase in respiratory effort, on nasal cannula O2 Cardiovascular system: S1 & S2 heard, RRR. No JVD, murmurs, rubs, gallops or clicks. No pedal edema. Gastrointestinal system: Abdomen is nondistended, soft and nontender. No organomegaly or masses felt. Normal bowel sounds heard. Central nervous system: Alert and oriented. No focal neurological deficits. Extremities: Symmetric 5 x 5 power. Skin: No rashes, lesions or ulcers Psychiatry: Judgement and insight appear normal. Mood & affect appropriate.    Data Reviewed: I have personally reviewed following labs and imaging studies  CBC: Recent Labs  Lab 01/31/19 0408 02/01/19 0416  02/02/19 0330 02/03/19 0430 02/04/19 0447  WBC 1.9* 2.3* 4.7 6.9 9.3  NEUTROABS 1.2* 1.6* 3.1 4.1 5.4  HGB 7.8* 8.4* 8.4* 8.6* 8.6*  HCT 25.5* 27.1* 27.2* 28.3* 28.6*  MCV 104.9* 103.4* 103.0* 103.7* 103.2*  PLT 52* 57* 69* 70* 77*   Basic Metabolic Panel: Recent Labs  Lab 01/29/19 0357  01/31/19 0759 02/01/19 0416 02/02/19 0330 02/03/19 0430 02/04/19 0447  NA 140   < > 142 141 142 141 140  K 4.3   < > 2.9* 3.5 4.0 4.4 4.6  CL 85*   < > 90* 91* 91* 91* 92*  CO2 46*   < > 42* 43* 47* 43* 41*  GLUCOSE 125*   < > 165* 154* 167* 170* 174*  BUN 26*   < > 28* 32* 34* 26* 25*  CREATININE 0.70   < > 0.74 0.70 0.75 0.62 0.61  CALCIUM 8.0*   < > 8.2* 8.1* 7.9* 8.7* 9.1  MG 1.9  --   --  2.4  --   --   --    < > =  values in this interval not displayed.   GFR: Estimated Creatinine Clearance: 65.3 mL/min (by C-G formula based on SCr of 0.61 mg/dL). Liver Function Tests: No results for input(s): AST, ALT, ALKPHOS, BILITOT, PROT, ALBUMIN in the last 168 hours. No results for input(s): LIPASE, AMYLASE in the last 168 hours. No results for input(s): AMMONIA in the last 168 hours. Coagulation Profile: No results for input(s): INR, PROTIME in the last 168 hours. Cardiac Enzymes: No results for input(s): CKTOTAL, CKMB, CKMBINDEX, TROPONINI in the last 168 hours. BNP (last 3 results) No results for input(s): PROBNP in the last 8760 hours. HbA1C: No results for input(s): HGBA1C in the last 72 hours. CBG: No results for input(s): GLUCAP in the last 168 hours. Lipid Profile: No results for input(s): CHOL, HDL, LDLCALC, TRIG, CHOLHDL, LDLDIRECT in the last 72 hours. Thyroid Function Tests: No results for input(s): TSH, T4TOTAL, FREET4, T3FREE, THYROIDAB in the last 72 hours. Anemia Panel: No results for input(s): VITAMINB12, FOLATE, FERRITIN, TIBC, IRON, RETICCTPCT in the last 72 hours. Sepsis Labs: No results for input(s): PROCALCITON, LATICACIDVEN in the last 168 hours.  Recent  Results (from the past 240 hour(s))  Respiratory Panel by PCR     Status: None   Collection Time: 01/25/19  1:12 PM  Result Value Ref Range Status   Adenovirus NOT DETECTED NOT DETECTED Final   Coronavirus 229E NOT DETECTED NOT DETECTED Final    Comment: (NOTE) The Coronavirus on the Respiratory Panel, DOES NOT test for the novel  Coronavirus (2019 nCoV)    Coronavirus HKU1 NOT DETECTED NOT DETECTED Final   Coronavirus NL63 NOT DETECTED NOT DETECTED Final   Coronavirus OC43 NOT DETECTED NOT DETECTED Final   Metapneumovirus NOT DETECTED NOT DETECTED Final   Rhinovirus / Enterovirus NOT DETECTED NOT DETECTED Final   Influenza A NOT DETECTED NOT DETECTED Final   Influenza B NOT DETECTED NOT DETECTED Final   Parainfluenza Virus 1 NOT DETECTED NOT DETECTED Final   Parainfluenza Virus 2 NOT DETECTED NOT DETECTED Final   Parainfluenza Virus 3 NOT DETECTED NOT DETECTED Final   Parainfluenza Virus 4 NOT DETECTED NOT DETECTED Final   Respiratory Syncytial Virus NOT DETECTED NOT DETECTED Final   Bordetella pertussis NOT DETECTED NOT DETECTED Final   Chlamydophila pneumoniae NOT DETECTED NOT DETECTED Final   Mycoplasma pneumoniae NOT DETECTED NOT DETECTED Final    Comment: Performed at [facility], [place] [address], [place], [place] [address]  SARS Coronavirus 2 (CEPHEID- Performed in [facility] hospital lab), Hosp Order     Status: None   Collection Time: 01/28/19  7:42 AM  Result Value Ref Range Status   SARS Coronavirus 2 NEGATIVE NEGATIVE Final    Comment: (NOTE) If result is NEGATIVE SARS-CoV-2 target nucleic acids are NOT DETECTED. The SARS-CoV-2 RNA is generally detectable in upper and lower  respiratory specimens during the acute phase of infection. The lowest  concentration of SARS-CoV-2 viral copies this assay can detect is 250  copies / mL. A negative result does not preclude SARS-CoV-2 infection  and should not be used as the sole basis for treatment or other  patient  management decisions.  A negative result may occur with  improper specimen collection / handling, submission of specimen other  than nasopharyngeal swab, presence of viral mutation(s) within the  areas targeted by this assay, and inadequate number of viral copies  (<250 copies / mL). A negative result must be combined with clinical  observations, patient history, and epidemiological information.  If result is POSITIVE SARS-CoV-2 target nucleic acids are DETECTED. The SARS-CoV-2 RNA is generally detectable in upper and lower  respiratory specimens dur ing the acute phase of infection.  Positive  results are indicative of active infection with SARS-CoV-2.  Clinical  correlation with patient history and other diagnostic information is  necessary to determine patient infection status.  Positive results do  not rule out bacterial infection or co-infection with other viruses. If result is PRESUMPTIVE POSTIVE SARS-CoV-2 nucleic acids MAY BE PRESENT.   A presumptive positive result was obtained on the submitted specimen  and confirmed on repeat testing.  While 2019 novel coronavirus  (SARS-CoV-2) nucleic acids may be present in the submitted sample  additional confirmatory testing may be necessary for epidemiological  and / or clinical management purposes  to differentiate between  SARS-CoV-2 and other Sarbecovirus currently known to infect humans.  If clinically indicated additional testing with an alternate test  methodology [phone]) is advised. The SARS-CoV-2 RNA is generally  detectable in upper and lower respiratory sp ecimens during the acute  phase of infection. The expected result is Negative. Fact Sheet for Patients:  StrictlyIdeas.no Fact Sheet for Healthcare Providers: BankingDealers.co.za This test is not yet approved or cleared by the [place] FDA and has been authorized for detection and/or diagnosis of SARS-CoV-2 by FDA under  an Emergency Use Authorization (EUA).  This EUA will remain in effect (meaning this test can be used) for the duration of the COVID-19 declaration under Section 564(b)(1) of the Act, 21 U.S.C. section 360bbb-3(b)(1), unless the authorization is terminated or revoked sooner. Performed at [facility], [place] [address] [place], [place] [address]   MRSA PCR Screening     Status: Abnormal   Collection Time: 01/29/19  9:40 AM  Result Value Ref Range Status   MRSA by PCR POSITIVE (A) NEGATIVE Final    Comment:        The GeneXpert MRSA Assay (FDA approved for NASAL specimens only), is one component of a comprehensive MRSA colonization surveillance program. It is not intended to diagnose MRSA infection nor to guide or monitor treatment for MRSA infections. RESULT CALLED TO, READ BACK BY AND VERIFIED WITH: H.MORRIS,RN [id number] @1137  BY V.WILKINS Performed at [facility] [address] [place], [place] [address]        Radiology Studies: No results found.    Scheduled Meds: . sodium chloride   Intravenous Once  . carvedilol  25 mg Oral BID WC  . Chlorhexidine Gluconate Cloth  6 each Topical Daily  . docusate sodium  100 mg Oral BID  . enoxaparin (LOVENOX) injection  40 mg Subcutaneous Q24H  . feeding supplement (ENSURE ENLIVE)  237 mL Oral BID BM  . folic acid  1 mg Oral Daily  . ipratropium-albuterol  3 mL Nebulization BID  . levothyroxine  150 mcg Oral Q0600  . mouth rinse  15 mL Mouth Rinse BID  . methylPREDNISolone (SOLU-MEDROL) injection  60 mg Intravenous Q6H  . multivitamin with minerals  1 tablet Oral Daily  . pantoprazole  40 mg Oral Daily  . polyethylene glycol  17 g Oral Daily  . sacubitril-valsartan  1 tablet Oral BID  . sodium chloride flush  10-40 mL Intracatheter Q12H  . spironolactone  25 mg Oral Daily  . vitamin B-12  50 mcg Oral Daily   Continuous Infusions:    LOS: 13 days    Time spent: 57minutes    Dessa Phi, DO [facility]  [email or web address] 02/04/2019, 11:04 AM

## 2019-02-05 LAB — COOXEMETRY PANEL
Carboxyhemoglobin: 1.6 % — ABNORMAL HIGH (ref 0.5–1.5)
Methemoglobin: 1.1 % (ref 0.0–1.5)
O2 Saturation: 71.5 %
Total hemoglobin: 8.7 g/dL — ABNORMAL LOW (ref 12.0–16.0)

## 2019-02-05 LAB — BASIC METABOLIC PANEL
Anion gap: 6 (ref 5–15)
BUN: 26 mg/dL — ABNORMAL HIGH (ref 8–23)
CO2: 41 mmol/L — ABNORMAL HIGH (ref 22–32)
Calcium: 8.5 mg/dL — ABNORMAL LOW (ref 8.9–10.3)
Chloride: 94 mmol/L — ABNORMAL LOW (ref 98–111)
Creatinine, Ser: 0.65 mg/dL (ref 0.44–1.00)
GFR calc Af Amer: >60 mL/min (ref >60)
GFR calc non Af Amer: >60 mL/min (ref >60)
Glucose, Bld: 209 mg/dL — ABNORMAL HIGH (ref 70–99)
Potassium: 4.7 mmol/L (ref 3.5–5.1)
Sodium: 141 mmol/L (ref 135–145)

## 2019-02-05 LAB — CBC WITH DIFFERENTIAL/PLATELET
Abs Immature Granulocytes: 1.84 10*3/uL — ABNORMAL HIGH (ref 0.00–0.07)
Basophils Absolute: 0.1 10*3/uL (ref 0.0–0.1)
Basophils Relative: 1 %
Eosinophils Absolute: 0 10*3/uL (ref 0.0–0.5)
Eosinophils Relative: 0 %
HCT: 27.9 % — ABNORMAL LOW (ref 36.0–46.0)
Hemoglobin: 8.3 g/dL — ABNORMAL LOW (ref 12.0–15.0)
Immature Granulocytes: 14 %
Lymphocytes Relative: 10 %
Lymphs Abs: 1.3 10*3/uL (ref 0.7–4.0)
MCH: 30.6 pg (ref 26.0–34.0)
MCHC: 29.7 g/dL — ABNORMAL LOW (ref 30.0–36.0)
MCV: 103 fL — ABNORMAL HIGH (ref 80.0–100.0)
Monocytes Absolute: 1.6 10*3/uL — ABNORMAL HIGH (ref 0.1–1.0)
Monocytes Relative: 12 %
Neutro Abs: 8.1 10*3/uL — ABNORMAL HIGH (ref 1.7–7.7)
Neutrophils Relative %: 63 %
Platelets: 86 10*3/uL — ABNORMAL LOW (ref 150–400)
RBC: 2.71 MIL/uL — ABNORMAL LOW (ref 3.87–5.11)
RDW: 17.6 % — ABNORMAL HIGH (ref 11.5–15.5)
WBC: 12.9 10*3/uL — ABNORMAL HIGH (ref 4.0–10.5)
nRBC: 11.5 % — ABNORMAL HIGH (ref 0.0–0.2)

## 2019-02-05 MED ORDER — FUROSEMIDE 40 MG PO TABS
40.0000 mg | ORAL_TABLET | Freq: Every day | ORAL | Status: DC
  Administered 2019-02-05 – 2019-02-08 (×3): 40 mg via ORAL
  Filled 2019-02-05 (×3): qty 1

## 2019-02-05 MED ORDER — SODIUM CHLORIDE 0.9 % IV SOLN
INTRAVENOUS | Status: DC | PRN
  Administered 2019-02-05: 250 mL via INTRAVENOUS

## 2019-02-05 NOTE — Progress Notes (Signed)
Physical Therapy Treatment Patient Details Name: [name] MRN: [id number] DOB: August 09, 1946 Today's Date: 02/05/2019    History of Present Illness 73 yo female admitted to ED on 5/11 with ShOB, hypoxia, with PNA sent from [facility]. Transferred to ICU-on BIPAP vs HFNC. 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 PMH includes pacemaker, GERD, EF 25-30% 2010, MVR, cardiomyopathy, depression, CHF.     PT Comments    Progressing with mobility.    Follow Up Recommendations  SNF     Equipment Recommendations  None recommended by PT    Recommendations for Other Services       Precautions / Restrictions Precautions Precautions: Fall Precaution Comments: monitor O2 Restrictions Weight Bearing Restrictions: No    Mobility  Bed Mobility Overal bed mobility: Needs Assistance Bed Mobility: Supine to Sit     Supine to sit: Min guard;HOB elevated     General bed mobility comments: close guard for safety. Increased time. Pt relied on bedrail.   Transfers Overall transfer level: Needs assistance Equipment used: Rolling walker (2 wheeled) Transfers: Sit to/from Stand Sit to Stand: Min assist         General transfer comment: Assist to rise, stabilize, control descent. VCs safety, technique, hand placement.   Ambulation/Gait Ambulation/Gait assistance: Min assist;+2 safety/equipment Gait Distance (Feet): 35 Feet Assistive device: Rolling walker (2 wheeled) Gait Pattern/deviations: Step-through pattern;Decreased stride length     General Gait Details: Assist to stabilize pt throughout distance. Cues for safety, proper use of RW. Dyspnea 2/4. Pt began to fatigue towards end of distance   Stairs             Wheelchair Mobility    Modified Rankin (Stroke Patients Only)       Balance Overall balance assessment: Needs assistance         Standing balance support: Bilateral upper extremity supported Standing balance-Leahy Scale: Poor                               Cognition Arousal/Alertness: Awake/alert Behavior During Therapy: WFL for tasks assessed/performed Overall Cognitive Status: Within Functional Limits for tasks assessed                                        Exercises      General Comments        Pertinent Vitals/Pain Pain Assessment: No/denies pain    Home Living                      Prior Function            PT Goals (current goals can now be found in the care plan section) Progress towards PT goals: Progressing toward goals    Frequency    Min 3X/week      PT Plan Current plan remains appropriate    Co-evaluation              AM-PAC PT "6 Clicks" Mobility   Outcome Measure  Help needed turning from your back to your side while in a flat bed without using bedrails?: A Little Help needed moving from lying on your back to sitting on the side of a flat bed without using bedrails?: A Little Help needed moving to and from a bed to a chair (including a wheelchair)?: A Little Help needed standing up from a chair  using your arms (e.g., wheelchair or bedside chair)?: A Little Help needed to walk in hospital room?: A Little Help needed climbing 3-5 steps with a railing? : A Little 6 Click Score: 18    End of Session Equipment Utilized During Treatment: Oxygen;Gait belt Activity Tolerance: Patient tolerated treatment well Patient left: in chair;with call bell/phone within reach;with chair alarm set   PT Visit Diagnosis: Muscle weakness (generalized) (M62.81);Difficulty in walking, not elsewhere classified (R26.2);Unsteadiness on feet (R26.81)     Time: 3299-2426 PT Time Calculation (min) (ACUTE ONLY): 15 min  Charges:  $Gait Training: 8-22 mins                        Weston Anna, PT Acute Rehabilitation Services Pager: [phone] Office: [phone]

## 2019-02-05 NOTE — Progress Notes (Signed)
Notified by CCMD that pt had 15 beats nonsustained V Tach. Pt asymptomatic. NP on call, Schorr notified. Pt currently SR at a rate of 61. Will continue to monitor.

## 2019-02-05 NOTE — Progress Notes (Signed)
PROGRESS NOTE    [name]  JHE:[id number] DOB: [birth year] DOA: 01/22/2019 PCP: Elby Showers, MD     Brief Narrative:  [name] is a 73 year old female who presented with dyspnea and weakness. 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 She physical examination blood pressure was 103/35, heart rate 77, respiratory rate 20, temperature 98.4, oxygen saturation 96%,her lungs were showing rhonchi bilaterally, heart S1-S2 present and rhythmic, abdomen was soft, no lower extremity edema. Sodium 132, potassium 3.6, chloride 83, bicarb 31, glucose 122, BUN 22, creatinine 1.0, white count 3.8, hemoglobin 6.1, hematocrit 19.0, platelets 65.Urinalysis negative for infection.Her chest x-ray had bilateral nodular infiltrates. SARS COVID-19 negative.  Patient was admitted to hospital working diagnosis of acute hypoxic respiratory failure due to multifocal pneumonia and was started on IV Rocephin and Zithromax. PCCM and cardiology were also consulted due to worsening respiratory distress.  She was found to have acute exacerbation of systolic heart failure and started on IV Lasix and milrinone.  She has also been on and off BiPAP.  New events last 24 hours / Subjective: Feeling well overall. Still remains on [place] O2.   Assessment & Plan:   Principal Problem:   Acute respiratory failure with hypoxia (HCC) Active Problems:   Hypothyroidism   Essential hypertension   Symptomatic anemia   MDS (myelodysplastic syndrome) (HCC)   Multifocal pneumonia   Respiratory failure (HCC)   Dyspnea   Congestive dilated cardiomyopathy (HCC)   Acute  systolic heart failure (HCC)   Acute on chronic systolic CHF (congestive heart failure), NYHA class 4 (HCC)   Acute hypoxic respiratory failuresecondary to acute on chronic systolic congestive heart failure and acute lung injury -Initially thought to be infectious in etiology and was started on Rocephin and azithromycin.  Patient has been tested negative for COVID 19 x2.    Respiratory viral panel also negative.  She remains afebrile.  Now off antibiotic -Appears to be related more so due to acute exacerbation of chronic systolic congestive heart failure.  Now off milrinone.  Has been on IV Lasix, now on PO. Cardiology following.  Spironolactone and Entresto added -Also related to acute lung injury.  Continue IV Solu-Medrol x 5 days - completed course.  PCCM signed off 5/22 -On 4 L nasal cannula O2 this morning, continue to wean as able, may need oxygen when discharged to SNF -Repeat outpatient CT in 8 weeks and follow-up pulmonary outpatient  Pancytopeniasecondary to MDS  -She has received total 4u pRBC during admission  -Improved. WBC elevated in setting of SoluMedrol use   Hypertension -Blood pressure on the lower side.  Continue Coreg and Entresto, spironolactone  Hypothyroidism -Continue Synthroid   DVT prophylaxis: Lovenox Code Status: DNR Family Communication: None Disposition Plan: Continue above treatments for acute respiratory failure, continue to wean off oxygen as able.  SNF placement pending   Consultants:   PCCM  Cardiology  Procedures:   None  Antimicrobials:  Anti-infectives (From admission, onward)   Start     Dose/Rate Route Frequency Ordered Stop   01/25/19 1400  azithromycin (ZITHROMAX) tablet 500 mg  Status:  Discontinued     500 mg Oral Every 24 hours  01/25/19 0946 01/29/19 0847   01/23/19 1400  azithromycin (ZITHROMAX) 500 mg in sodium chloride 0.9 % 250 mL IVPB  Status:  Discontinued     500 mg 250 mL/hr over 60 Minutes Intravenous Every 24  hours 01/22/19 1428 01/25/19 0946   01/22/19 1500  cefTRIAXone (ROCEPHIN) 1 g in sodium chloride 0.9 % 100 mL IVPB  Status:  Discontinued     1 g 200 mL/hr over 30 Minutes Intravenous Every 24 hours 01/22/19 1428 01/31/19 1034   01/22/19 1300  cefTRIAXone (ROCEPHIN) 1 g in sodium chloride 0.9 % 100 mL IVPB  Status:  Discontinued     1 g 200 mL/hr over 30 Minutes Intravenous  Once 01/22/19 1258 01/22/19 1429   01/22/19 1300  azithromycin (ZITHROMAX) 500 mg in sodium chloride 0.9 % 250 mL IVPB     500 mg 250 mL/hr over 60 Minutes Intravenous  Once 01/22/19 1258 01/22/19 1431       Objective: Vitals:   02/05/19 0514 02/05/19 0806 02/05/19 0835 02/05/19 0900  BP:  (!) 107/54  (!) 110/50  Pulse:  61  68  Resp:  16  20  Temp:    97.6 F (36.4 C)  TempSrc:    Oral  SpO2:  96% (!) 88% 98%  Weight: 84.5 kg     Height:        Intake/Output Summary (Last 24 hours) at 02/05/2019 1150 Last data filed at 02/05/2019 0903 Gross per 24 hour  Intake 120 ml  Output 900 ml  Net -780 ml   Filed Weights   02/03/19 0149 02/04/19 0500 02/05/19 0514  Weight: 83.5 kg 84.2 kg 84.5 kg    Examination: General exam: Appears calm and comfortable  Respiratory system: Bibasilar crackles but improved from previous examination.  Respiratory effort stable.  On nasal cannula O2 Cardiovascular system: S1 & S2 heard, RRR. No JVD, murmurs, rubs, gallops or clicks. No pedal edema. Gastrointestinal system: Abdomen is nondistended, soft and nontender. No organomegaly or masses felt. Normal bowel sounds heard. Central nervous system: Alert and oriented. No focal neurological deficits. Extremities: Symmetric 5 x 5 power. Skin: No rashes, lesions or ulcers Psychiatry: Judgement and insight appear normal. Mood & affect appropriate.    Data Reviewed: I have personally reviewed following labs and imaging studies  CBC: Recent Labs  Lab 02/01/19 0416 02/02/19 0330 02/03/19 0430 02/04/19 0447 02/05/19 0350   WBC 2.3* 4.7 6.9 9.3 12.9*  NEUTROABS 1.6* 3.1 4.1 5.4 8.1*  HGB 8.4* 8.4* 8.6* 8.6* 8.3*  HCT 27.1* 27.2* 28.3* 28.6* 27.9*  MCV 103.4* 103.0* 103.7* 103.2* 103.0*  PLT 57* 69* 70* 77* 86*   Basic Metabolic Panel: Recent Labs  Lab 02/01/19 0416 02/02/19 0330 02/03/19 0430 02/04/19 0447 02/05/19 0350  NA 141 142 141 140 141  K 3.5 4.0 4.4 4.6 4.7  CL 91* 91* 91* 92* 94*  CO2 43* 47* 43* 41* 41*  GLUCOSE 154* 167* 170* 174* 209*  BUN 32* 34* 26* 25* 26*  CREATININE 0.70 0.75 0.62 0.61 0.65  CALCIUM 8.1* 7.9* 8.7* 9.1 8.5*  MG 2.4  --   --   --   --    GFR: Estimated Creatinine Clearance: 65.4 mL/min (by C-G formula based on SCr of 0.65 mg/dL). Liver Function Tests: No results for input(s): AST, ALT, ALKPHOS, BILITOT, PROT, ALBUMIN in the last 168 hours. No results for input(s): LIPASE, AMYLASE in the last 168 hours. No results for input(s): AMMONIA in the last 168 hours. Coagulation  Profile: No results for input(s): INR, PROTIME in the last 168 hours. Cardiac Enzymes: No results for input(s): CKTOTAL, CKMB, CKMBINDEX, TROPONINI in the last 168 hours. BNP (last 3 results) No results for input(s): PROBNP in the last 8760 hours. HbA1C: No results for input(s): HGBA1C in the last 72 hours. CBG: No results for input(s): GLUCAP in the last 168 hours. Lipid Profile: No results for input(s): CHOL, HDL, LDLCALC, TRIG, CHOLHDL, LDLDIRECT in the last 72 hours. Thyroid Function Tests: No results for input(s): TSH, T4TOTAL, FREET4, T3FREE, THYROIDAB in the last 72 hours. Anemia Panel: No results for input(s): VITAMINB12, FOLATE, FERRITIN, TIBC, IRON, RETICCTPCT in the last 72 hours. Sepsis Labs: No results for input(s): PROCALCITON, LATICACIDVEN in the last 168 hours.  Recent Results (from the past 240 hour(s))  SARS Coronavirus 2 (CEPHEID- Performed in [place] hospital lab), Hosp Order     Status: None   Collection Time: 01/28/19  7:42 AM  Result Value Ref Range Status    SARS Coronavirus 2 NEGATIVE NEGATIVE Final    Comment: (NOTE) If result is NEGATIVE SARS-CoV-2 target nucleic acids are NOT DETECTED. The SARS-CoV-2 RNA is generally detectable in upper and lower  respiratory specimens during the acute phase of infection. The lowest  concentration of SARS-CoV-2 viral copies this assay can detect is 250  copies / mL. A negative result does not preclude SARS-CoV-2 infection  and should not be used as the sole basis for treatment or other  patient management decisions.  A negative result may occur with  improper specimen collection / handling, submission of specimen other  than nasopharyngeal swab, presence of viral mutation(s) within the  areas targeted by this assay, and inadequate number of viral copies  (<250 copies / mL). A negative result must be combined with clinical  observations, patient history, and epidemiological information. If result is POSITIVE SARS-CoV-2 target nucleic acids are DETECTED. The SARS-CoV-2 RNA is generally detectable in upper and lower  respiratory specimens dur ing the acute phase of infection.  Positive  results are indicative of active infection with SARS-CoV-2.  Clinical  correlation with patient history and other diagnostic information is  necessary to determine patient infection status.  Positive results do  not rule out bacterial infection or co-infection with other viruses. If result is PRESUMPTIVE POSTIVE SARS-CoV-2 nucleic acids MAY BE PRESENT.   A presumptive positive result was obtained on the submitted specimen  and confirmed on repeat testing.  While 2019 novel coronavirus  (SARS-CoV-2) nucleic acids may be present in the submitted sample  additional confirmatory testing may be necessary for epidemiological  and / or clinical management purposes  to differentiate between  SARS-CoV-2 and other Sarbecovirus currently known to infect humans.  If clinically indicated additional testing with an alternate test   methodology [phone]) is advised. The SARS-CoV-2 RNA is generally  detectable in upper and lower respiratory sp ecimens during the acute  phase of infection. The expected result is Negative. Fact Sheet for Patients:  StrictlyIdeas.no Fact Sheet for Healthcare Providers: BankingDealers.co.za This test is not yet approved or cleared by the [place] FDA and has been authorized for detection and/or diagnosis of SARS-CoV-2 by FDA under an Emergency Use Authorization (EUA).  This EUA will remain in effect (meaning this test can be used) for the duration of the COVID-19 declaration under Section 564(b)(1) of the Act, 21 U.S.C. section 360bbb-3(b)(1), unless the authorization is terminated or revoked sooner. Performed at [facility], [address] [place], [place]  [address]   MRSA PCR Screening     Status: Abnormal   Collection Time: 01/29/19  9:40 AM  Result Value Ref Range Status   MRSA by PCR POSITIVE (A) NEGATIVE Final    Comment:        The GeneXpert MRSA Assay (FDA approved for NASAL specimens only), is one component of a comprehensive MRSA colonization surveillance program. It is not intended to diagnose MRSA infection nor to guide or monitor treatment for MRSA infections. RESULT CALLED TO, READ BACK BY AND VERIFIED WITH: H.MORRIS,RN [id number] @1137  BY V.WILKINS Performed at [facility] [address], [place], [place] [address]        Radiology Studies: No results found.    Scheduled Meds: . sodium chloride   Intravenous Once  . carvedilol  25 mg Oral BID WC  . docusate sodium  100 mg Oral BID  . enoxaparin (LOVENOX) injection  40 mg Subcutaneous Q24H  . feeding supplement (ENSURE ENLIVE)  237 mL Oral BID BM  . folic acid  1 mg Oral Daily  . furosemide  40 mg Oral Daily  . ipratropium-albuterol  3 mL Nebulization BID  . levothyroxine  150 mcg Oral Q0600  . mouth rinse  15  mL Mouth Rinse BID  . multivitamin with minerals  1 tablet Oral Daily  . pantoprazole  40 mg Oral Daily  . polyethylene glycol  17 g Oral Daily  . sacubitril-valsartan  1 tablet Oral BID  . sodium chloride flush  10-40 mL Intracatheter Q12H  . spironolactone  25 mg Oral Daily  . vitamin B-12  50 mcg Oral Daily   Continuous Infusions:    LOS: 14 days    Time spent: 32minutes   Dessa Phi, DO [facility] [email or web address] 02/05/2019, 11:50 AM

## 2019-02-05 NOTE — Progress Notes (Signed)
Occupational Therapy Treatment Patient Details Name: [name] MRN: [id number] DOB: 03/28/1946 Today's Date: 02/05/2019    History of present illness 73 yo female admitted to ED on 5/11 with ShOB, hypoxia, with PNA sent from MD. Transferred to ICU-on BIPAP vs HFNC. 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 PMH includes pacemaker, GERD, EF 25-30% 2010, MVR, cardiomyopathy, depression, CHF.    OT comments  Pt did well with OT this day  Follow Up Recommendations  SNF          Precautions / Restrictions Precautions Precautions: Fall Precaution Comments: monitor O2 Restrictions Weight Bearing Restrictions: No       Mobility Bed Mobility Overal bed mobility: Needs Assistance Bed Mobility: Supine to Sit     Supine to sit: Min guard;HOB elevated     General bed mobility comments: pt in chair  Transfers Overall transfer level: Needs assistance Equipment used: Rolling walker (2 wheeled) Transfers: Sit to/from Stand Sit to Stand: Min assist         General transfer comment: Assist to rise, stabilize, control descent. VCs safety, technique, hand placement.     Balance Overall balance assessment: Needs assistance         Standing balance support: Bilateral upper extremity supported Standing balance-Leahy Scale: Poor                             ADL either performed or assessed with clinical judgement   ADL Overall ADL's : Needs assistance/impaired Eating/Feeding: Set up;Sitting   Grooming: Set up;Sitting;Wash/dry face;Wash/dry hands                                 General ADL Comments: pt anticipating lunch coming this day.  Pt very pleasant but aware she need rehab post hospital stay               Cognition Arousal/Alertness: Awake/alert Behavior During Therapy: WFL for tasks assessed/performed Overall Cognitive Status: Within Functional Limits for tasks assessed                                                      Pertinent Vitals/ Pain       Pain Assessment: No/denies pain         Frequency  Min 2X/week        Progress Toward Goals  OT Goals(current goals can now be found in the care plan section)  Progress towards OT goals: Progressing toward goals     Plan Discharge plan remains appropriate       AM-PAC OT "6 Clicks" Daily Activity     Outcome Measure   Help from another person eating meals?: None Help from another person taking care of personal grooming?: A Little Help from another person toileting, which includes using toliet, bedpan, or urinal?: A Lot Help from another person bathing (including washing, rinsing, drying)?: A Lot Help from another person to put on and taking off regular upper body clothing?: A Little Help from another person to put on and taking off regular lower body clothing?: A Lot 6 Click Score: 16    End of Session Equipment Utilized During Treatment: Rolling walker  OT Visit Diagnosis: Unsteadiness on feet (R26.81)   Activity Tolerance Patient limited  by fatigue   Patient Left in chair;with call bell/phone within reach;with chair alarm set   Nurse Communication          Time: [phone] OT Time Calculation (min): 15 min  Charges: OT General Charges $OT Visit: 1 Visit OT Treatments $Self Care/Home Management : 8-22 mins  Kari Baars, [place] [phone] Office- [place], Edwena Felty D 02/05/2019, 2:22 PM

## 2019-02-05 NOTE — Patient Instructions (Signed)
Please have chest x-ray early next week for evaluation of shortness of breath and if symptoms worsen over the weekend go immediately to [facility] long emergency department.

## 2019-02-05 NOTE — Plan of Care (Signed)
  Problem: Health Behavior/Discharge Planning: Goal: Ability to manage health-related needs will improve Outcome: Progressing   Problem: Clinical Measurements: Goal: Ability to maintain clinical measurements within normal limits will improve Outcome: Progressing Goal: Will remain free from infection Outcome: Progressing Goal: Diagnostic test results will improve Outcome: Progressing Goal: Respiratory complications will improve Outcome: Progressing Goal: Cardiovascular complication will be avoided Outcome: Progressing   Problem: Activity: Goal: Risk for activity intolerance will decrease Outcome: Progressing   Problem: Coping: Goal: Level of anxiety will decrease Outcome: Progressing   Problem: Safety: Goal: Ability to remain free from injury will improve Outcome: Progressing

## 2019-02-05 NOTE — TOC Progression Note (Signed)
Transition of Care [facility]) - Progression Note    Patient Details  Name: [name] MRN: [id number] Date of Birth: 06-10-1946  Transition of Care [facility]) CM/SW [place], LCSW Phone Number: 02/05/2019, 12:39 PM  Clinical Narrative:   CSW assisting patient in discharge plan. CSW spoke with patient at bedside about SNF and she stated to CSW that she would like her friend Anderson Malta to pick SNF on her behalf.  CSW spoke with Anderson Malta via phone and stated to her that the facilities she had requested patient be faxed out to [facility], [facility]  and [facility]) have not made a bed offer. CSW stated that if patient does not get a bed offer from those facilities Anderson Malta will have to pick from the facilities that did make a bed offer.  Anderson Malta made aware that patient might dc tomorrow and discharge plan will need to be agreed on before discharge     Expected Discharge Plan: [place] Barriers to Discharge: Continued Medical Work up  Expected Discharge Plan and Services Expected Discharge Plan: [place]   Discharge Planning Services: CM Consult   Living arrangements for the past 2 months: Single Family Home Expected Discharge Date: (unknown)                                     Social Determinants of Health (SDOH) Interventions    Readmission Risk Interventions Readmission Risk Prevention Plan 01/29/2019  Transportation Screening Complete  PCP or Specialist Appt within 3-5 Days Not Complete  Some recent data might be hidden

## 2019-02-05 NOTE — Progress Notes (Signed)
Progress Note  Patient Name: [name] Date of Encounter: 02/05/2019  Primary Cardiologist: [name], MD   Subjective   Slept well, no complaints this AM. Feels that breathing is stable. Reviewed medication changes, tolerating well. Reviewed home meds, she reports that she was taking furosemide 40 mg daily (note in chart says 80 mg).   Inpatient Medications    Scheduled Meds: . sodium chloride   Intravenous Once  . carvedilol  25 mg Oral BID WC  . Chlorhexidine Gluconate Cloth  6 each Topical Daily  . docusate sodium  100 mg Oral BID  . enoxaparin (LOVENOX) injection  40 mg Subcutaneous Q24H  . feeding supplement (ENSURE ENLIVE)  237 mL Oral BID BM  . folic acid  1 mg Oral Daily  . ipratropium-albuterol  3 mL Nebulization BID  . levothyroxine  150 mcg Oral Q0600  . mouth rinse  15 mL Mouth Rinse BID  . multivitamin with minerals  1 tablet Oral Daily  . pantoprazole  40 mg Oral Daily  . polyethylene glycol  17 g Oral Daily  . sacubitril-valsartan  1 tablet Oral BID  . sodium chloride flush  10-40 mL Intracatheter Q12H  . spironolactone  25 mg Oral Daily  . vitamin B-12  50 mcg Oral Daily   Continuous Infusions:  PRN Meds: albuterol, heparin lock flush, heparin lock flush, prochlorperazine   Vital Signs    Vitals:   02/05/19 0514 02/05/19 0806 02/05/19 0835 02/05/19 0900  BP:  (!) 107/54  (!) 110/50  Pulse:  61  68  Resp:  16  20  Temp:    97.6 F (36.4 C)  TempSrc:    Oral  SpO2:  96% (!) 88% 98%  Weight: 84.5 kg     Height:        Intake/Output Summary (Last 24 hours) at 02/05/2019 0919 Last data filed at 02/05/2019 2025 Gross per 24 hour  Intake 120 ml  Output 900 ml  Net -780 ml   Last 3 Weights 02/05/2019 02/04/2019 02/03/2019  Weight (lbs) 186 lb 4.6 oz 185 lb 10 oz 184 lb 1.4 oz  Weight (kg) 84.5 kg 84.2 kg 83.5 kg      Telemetry    NSR- Personally Reviewed  ECG    No new since 5/18- Personally Reviewed  Physical Exam   BP (!)  110/50 (BP Location: Left Arm)   Pulse 68   Temp 97.6 F (36.4 C) (Oral)   Resp 20   Ht 5\' 3"  (1.6 m)   Wt 84.5 kg   SpO2 98%   BMI 33.00 kg/m    Vitals:   02/05/19 0514 02/05/19 0806 02/05/19 0835 02/05/19 0900  BP:  (!) 107/54  (!) 110/50  Pulse:  61  68  Resp:  16  20  Temp:    97.6 F (36.4 C)  TempSrc:    Oral  SpO2:  96% (!) 88% 98%  Weight: 84.5 kg     Height:        Intake/Output Summary (Last 24 hours) at 02/05/2019 0927 Last data filed at 02/05/2019 4270 Gross per 24 hour  Intake 120 ml  Output 900 ml  Net -780 ml   Last 3 Weights 02/05/2019 02/04/2019 02/03/2019  Weight (lbs) 186 lb 4.6 oz 185 lb 10 oz 184 lb 1.4 oz  Weight (kg) 84.5 kg 84.2 kg 83.5 kg     Wt Readings from Last 3 Encounters:  02/05/19 84.5 kg  01/08/19 86 kg  12/27/18 86.7 kg    GEN: Well nourished, well developed in no acute distress HEENT: Normal NECK: JVD just at clavicle sitting upright, around 11 cm H2O LYMPHATICS: No lymphadenopathy appreciated CARDIAC: regular rhythm, normal S1 and S2, no murmurs, rubs, gallops. Radial and DP pulses 2+ bilaterally. RESPIRATORY:  Clear to auscultation in upper fields without wheezing or rhonchi , faint bibasilar rales ABDOMEN: Soft, non-tender, non-distended MUSCULOSKELETAL:  Trace bilateral LE edema; No deformity  SKIN: Warm and dry NEUROLOGIC:  Alert and oriented x 3 PSYCHIATRIC:  Normal affect   Labs    Chemistry Recent Labs  Lab 02/03/19 0430 02/04/19 0447 02/05/19 0350  NA 141 140 141  K 4.4 4.6 4.7  CL 91* 92* 94*  CO2 43* 41* 41*  GLUCOSE 170* 174* 209*  BUN 26* 25* 26*  CREATININE 0.62 0.61 0.65  CALCIUM 8.7* 9.1 8.5*  GFRNONAA >60 >60 >60  GFRAA >60 >60 >60  ANIONGAP 7 7 6      Hematology Recent Labs  Lab 02/03/19 0430 02/04/19 0447 02/05/19 0350  WBC 6.9 9.3 12.9*  RBC 2.73* 2.77* 2.71*  HGB 8.6* 8.6* 8.3*  HCT 28.3* 28.6* 27.9*  MCV 103.7* 103.2* 103.0*  MCH 31.5 31.0 30.6  MCHC 30.4 30.1 29.7*  RDW 17.2* 17.5*  17.6*  PLT 70* 77* 86*    Cardiac EnzymesNo results for input(s): TROPONINI in the last 168 hours. No results for input(s): TROPIPOC in the last 168 hours.   BNP No results for input(s): BNP, PROBNP in the last 168 hours.   DDimer No results for input(s): DDIMER in the last 168 hours.   Radiology    No results found.  Cardiac Studies   Echo 01/28/19 personally reviewed  1. The left ventricle has severely reduced systolic function, with an ejection fraction of 20-25%. The cavity size was severely dilated. Left ventricular diastolic Doppler parameters are consistent with pseudonormalization. Elevated left ventricular  end-diastolic pressure Left ventricular diffuse hypokinesis. No evidence of LV thrombus by definity contrast study.  2. The right ventricle has normal systolic function. The cavity was normal. There is no increase in right ventricular wall thickness. Right ventricular systolic pressure is moderately elevated with an estimated pressure of 54.9 mmHg.  3. Left atrial size was moderately dilated.  4. There is mild mitral annular calcification present. Mitral valve regurgitation is mild by color flow Doppler.  5. The aortic valve is tricuspid. There is trivial AR.  6. The inferior vena cava was dilated in size with <50% respiratory variability.  Patient Profile     73 y.o. 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 Maylene Roes   Assessment & Plan    Acute on chronic systolic and diastolic heart failure with acute hypoxic respiratory failure on presentation: Multifactorial hypoxia, including severe anemia and acute lung injury as well as heart failure. -on presentation and early admission, required intermittent BiPAP, NRB, and milrinone. Much improved clinically, on 4-5 L [place]. -EF this admission 20-25%,  pseudonormalization on diastology. RVSP was 55 mmHg. -admission weight 86.6 kg. Noted as net negative 8 L this admission , but weight is 84.5 kg (peak was 87.6 kg, nadir was 81.8 kg). -will restart home furosemide 40 mg daily PO today. -continue carvedilol 25 BID -continue Entresto 24-26 BID  -continue spironolactone 25 mg daily -Co-ox 71.5 today, stable -  has ICD, but wishes to be DNR. She plans to discuss long term management of this after discharge with Dr. Lovena Le and Dr. Acie Fredrickson. -Cr 0.65 (stable), K 4.7, BUN 26.  MDS with severe anemia: Hgb stable, WBC up slightly today to 12.9, platelets 86. Did receive steroids this admission.  HTN:   BP 110/50 this AM, tolerating carvedilol, entresto and spironolactone.   For questions or updates, please contact [place] Please consult [email or web address] for contact info under     Signed, Buford Dresser, MD  02/05/2019, 9:19 AM

## 2019-02-06 DIAGNOSIS — R0602 Shortness of breath: Secondary | ICD-10-CM

## 2019-02-06 LAB — CBC WITH DIFFERENTIAL/PLATELET
Abs Immature Granulocytes: 2.31 10*3/uL — ABNORMAL HIGH (ref 0.00–0.07)
Basophils Absolute: 0.1 10*3/uL (ref 0.0–0.1)
Basophils Relative: 1 %
Eosinophils Absolute: 0 10*3/uL (ref 0.0–0.5)
Eosinophils Relative: 0 %
HCT: 28.3 % — ABNORMAL LOW (ref 36.0–46.0)
Hemoglobin: 8.9 g/dL — ABNORMAL LOW (ref 12.0–15.0)
Immature Granulocytes: 14 %
Lymphocytes Relative: 18 %
Lymphs Abs: 3 10*3/uL (ref 0.7–4.0)
MCH: 32.2 pg (ref 26.0–34.0)
MCHC: 31.4 g/dL (ref 30.0–36.0)
MCV: 102.5 fL — ABNORMAL HIGH (ref 80.0–100.0)
Monocytes Absolute: 2.7 10*3/uL — ABNORMAL HIGH (ref 0.1–1.0)
Monocytes Relative: 17 %
Neutro Abs: 8.2 10*3/uL — ABNORMAL HIGH (ref 1.7–7.7)
Neutrophils Relative %: 50 %
Platelets: 100 10*3/uL — ABNORMAL LOW (ref 150–400)
RBC: 2.76 MIL/uL — ABNORMAL LOW (ref 3.87–5.11)
RDW: 17.9 % — ABNORMAL HIGH (ref 11.5–15.5)
WBC: 16.3 10*3/uL — ABNORMAL HIGH (ref 4.0–10.5)
nRBC: 10 % — ABNORMAL HIGH (ref 0.0–0.2)

## 2019-02-06 LAB — BASIC METABOLIC PANEL
Anion gap: 4 — ABNORMAL LOW (ref 5–15)
BUN: 31 mg/dL — ABNORMAL HIGH (ref 8–23)
CO2: 43 mmol/L — ABNORMAL HIGH (ref 22–32)
Calcium: 8.9 mg/dL (ref 8.9–10.3)
Chloride: 93 mmol/L — ABNORMAL LOW (ref 98–111)
Creatinine, Ser: 0.73 mg/dL (ref 0.44–1.00)
GFR calc Af Amer: >60 mL/min (ref >60)
GFR calc non Af Amer: >60 mL/min (ref >60)
Glucose, Bld: 105 mg/dL — ABNORMAL HIGH (ref 70–99)
Potassium: 4.5 mmol/L (ref 3.5–5.1)
Sodium: 140 mmol/L (ref 135–145)

## 2019-02-06 MED ORDER — SPIRONOLACTONE 12.5 MG HALF TABLET
12.5000 mg | ORAL_TABLET | Freq: Every day | ORAL | Status: DC
  Administered 2019-02-07 – 2019-02-08 (×2): 12.5 mg via ORAL
  Filled 2019-02-06 (×2): qty 1

## 2019-02-06 NOTE — Progress Notes (Signed)
PROGRESS NOTE    [name]  ERX:[id number] DOB: September 24, 1945 DOA: 01/22/2019 PCP: Elby Showers, MD     Brief Narrative:  [name] is a 73 year old female who presented with dyspnea and weakness. 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 She physical examination blood pressure was 103/35, heart rate 77, respiratory rate 20, temperature 98.4, oxygen saturation 96%,her lungs were showing rhonchi bilaterally, heart S1-S2 present and rhythmic, abdomen was soft, no lower extremity edema. Sodium 132, potassium 3.6, chloride 83, bicarb 31, glucose 122, BUN 22, creatinine 1.0, white count 3.8, hemoglobin 6.1, hematocrit 19.0, platelets 65.Urinalysis negative for infection.Her chest x-ray had bilateral nodular infiltrates. SARS COVID-19 negative.  Patient was admitted to hospital working diagnosis of acute hypoxic respiratory failure due to multifocal pneumonia and was started on IV Rocephin and Zithromax. PCCM and cardiology were also consulted due to worsening respiratory distress.  She was found to have acute exacerbation of systolic heart failure and started on IV Lasix and milrinone.  She has also been on and off BiPAP.  She continued to have slow improvements, transferred out of stepdown unit. Awaiting SNF placement.   New events last 24 hours / Subjective: No new complaints this morning.  Blood pressure slightly low, patient asymptomatic.  No complaints of chest pain or worsening shortness of breath.  She is currently on 3 L nasal cannula O2.  Assessment & Plan:   Principal Problem:   Acute respiratory failure with hypoxia  (HCC) Active Problems:   Hypothyroidism   Essential hypertension   Symptomatic anemia   MDS (myelodysplastic syndrome) (HCC)   Multifocal pneumonia   Respiratory failure (HCC)   Dyspnea   Congestive dilated cardiomyopathy (HCC)   Acute systolic heart failure (HCC)   Acute on chronic systolic CHF (congestive heart failure), NYHA class 4 (HCC)   Acute hypoxic respiratory failuresecondary to acute on chronic systolic congestive heart failure and acute lung injury -Initially thought to be infectious in etiology and was started on Rocephin and azithromycin.  Patient has been tested negative for COVID 19 x2.    Respiratory viral panel also negative.  She remains afebrile.  Now off antibiotic -Appears to be related more so due to acute exacerbation of chronic systolic congestive heart failure.  Now off milrinone.  Has been on IV Lasix, now on PO - on hold this morning. Cardiology following.  Spironolactone and Entresto added - on hold this morning  -Also related to acute lung injury.  Continue IV Solu-Medrol x 5 days - completed course.  PCCM signed off 5/22 -On 3 L nasal cannula O2 this morning, continue to wean as able, may need oxygen when discharged to SNF -Repeat outpatient CT in 8 weeks and follow-up pulmonary outpatient  Pancytopeniasecondary to MDS  -She has received total 4u pRBC during admission  -Improved. WBC elevated in setting of SoluMedrol use   Hypertension -Blood pressure on the lower side.  Coreg and Entresto, spironolactone - on hold this morning   Hypothyroidism -Continue Synthroid   DVT prophylaxis: Lovenox Code Status: DNR Family Communication: None Disposition Plan: Continue above treatments for acute respiratory failure, continue to wean off oxygen as able.  SNF placement pending  Consultants:   PCCM  Cardiology  Procedures:   None  Antimicrobials:  Anti-infectives (From admission, onward)   Start     Dose/Rate Route Frequency Ordered Stop    01/25/19 1400  azithromycin (ZITHROMAX) tablet 500 mg  Status:  Discontinued     500 mg Oral Every 24 hours 01/25/19 0946 01/29/19 0847   01/23/19 1400  azithromycin (ZITHROMAX) 500 mg in sodium chloride 0.9 % 250 mL IVPB  Status:  Discontinued     500 mg 250 mL/hr over 60 Minutes Intravenous Every 24 hours 01/22/19 1428 01/25/19 0946   01/22/19 1500  cefTRIAXone (ROCEPHIN) 1 g in sodium chloride 0.9 % 100 mL IVPB  Status:  Discontinued     1 g 200 mL/hr over 30 Minutes Intravenous Every 24 hours 01/22/19 1428 01/31/19 1034   01/22/19 1300  cefTRIAXone (ROCEPHIN) 1 g in sodium chloride 0.9 % 100 mL IVPB  Status:  Discontinued     1 g 200 mL/hr over 30 Minutes Intravenous  Once 01/22/19 1258 01/22/19 1429   01/22/19 1300  azithromycin (ZITHROMAX) 500 mg in sodium chloride 0.9 % 250 mL IVPB     500 mg 250 mL/hr over 60 Minutes Intravenous  Once 01/22/19 1258 01/22/19 1431       Objective: Vitals:   02/06/19 0500 02/06/19 0503 02/06/19 0815 02/06/19 0848  BP:  (!) 89/52  (!) 96/41  Pulse:  64  61  Resp:  16    Temp:  97.8 F (36.6 C)    TempSrc:  Oral    SpO2:  98% 96% 94%  Weight: 85.4 kg     Height:        Intake/Output Summary (Last 24 hours) at 02/06/2019 1029 Last data filed at 02/06/2019 0600 Gross per 24 hour  Intake 304.56 ml  Output 1200 ml  Net -895.44 ml   Filed Weights   02/04/19 0500 02/05/19 0514 02/06/19 0500  Weight: 84.2 kg 84.5 kg 85.4 kg    Examination: General exam: Appears calm and comfortable  Respiratory system: Minimal crackles, much improved from previous exams. Respiratory effort normal. On 3L [place] O2  Cardiovascular system: S1 & S2 heard, RRR. No JVD, murmurs, rubs, gallops or clicks. No pedal edema. Gastrointestinal system: Abdomen is nondistended, soft and nontender. No organomegaly or masses felt. Normal bowel sounds heard. Central nervous system: Alert and oriented. No focal neurological deficits. Extremities: Symmetric 5 x 5 power. Skin: No  rashes, lesions or ulcers Psychiatry: Judgement and insight appear normal. Mood & affect appropriate.     Data Reviewed: I have personally reviewed following labs and imaging studies  CBC: Recent Labs  Lab 02/02/19 0330 02/03/19 0430 02/04/19 0447 02/05/19 0350 02/06/19 0437  WBC 4.7 6.9 9.3 12.9* 16.3*  NEUTROABS 3.1 4.1 5.4 8.1* 8.2*  HGB 8.4* 8.6* 8.6* 8.3* 8.9*  HCT 27.2* 28.3* 28.6* 27.9* 28.3*  MCV 103.0* 103.7* 103.2* 103.0* 102.5*  PLT 69* 70* 77* 86* 283*   Basic Metabolic Panel: Recent Labs  Lab 02/01/19 0416 02/02/19 0330 02/03/19 0430 02/04/19 0447 02/05/19 0350 02/06/19 0437  NA 141 142 141 140 141 140  K 3.5 4.0 4.4 4.6 4.7 4.5  CL 91* 91* 91* 92* 94* 93*  CO2 43* 47* 43* 41* 41* 43*  GLUCOSE 154* 167* 170* 174* 209* 105*  BUN 32* 34* 26* 25* 26* 31*  CREATININE 0.70 0.75 0.62 0.61 0.65 0.73  CALCIUM 8.1* 7.9* 8.7* 9.1 8.5* 8.9  MG 2.4  --   --   --   --   --  GFR: Estimated Creatinine Clearance: 65.8 mL/min (by C-G formula based on SCr of 0.73 mg/dL). Liver Function Tests: No results for input(s): AST, ALT, ALKPHOS, BILITOT, PROT, ALBUMIN in the last 168 hours. No results for input(s): LIPASE, AMYLASE in the last 168 hours. No results for input(s): AMMONIA in the last 168 hours. Coagulation Profile: No results for input(s): INR, PROTIME in the last 168 hours. Cardiac Enzymes: No results for input(s): CKTOTAL, CKMB, CKMBINDEX, TROPONINI in the last 168 hours. BNP (last 3 results) No results for input(s): PROBNP in the last 8760 hours. HbA1C: No results for input(s): HGBA1C in the last 72 hours. CBG: No results for input(s): GLUCAP in the last 168 hours. Lipid Profile: No results for input(s): CHOL, HDL, LDLCALC, TRIG, CHOLHDL, LDLDIRECT in the last 72 hours. Thyroid Function Tests: No results for input(s): TSH, T4TOTAL, FREET4, T3FREE, THYROIDAB in the last 72 hours. Anemia Panel: No results for input(s): VITAMINB12, FOLATE, FERRITIN,  TIBC, IRON, RETICCTPCT in the last 72 hours. Sepsis Labs: No results for input(s): PROCALCITON, LATICACIDVEN in the last 168 hours.  Recent Results (from the past 240 hour(s))  SARS Coronavirus 2 (CEPHEID- Performed in [facility] hospital lab), Hosp Order     Status: None   Collection Time: 01/28/19  7:42 AM  Result Value Ref Range Status   SARS Coronavirus 2 NEGATIVE NEGATIVE Final    Comment: (NOTE) If result is NEGATIVE SARS-CoV-2 target nucleic acids are NOT DETECTED. The SARS-CoV-2 RNA is generally detectable in upper and lower  respiratory specimens during the acute phase of infection. The lowest  concentration of SARS-CoV-2 viral copies this assay can detect is 250  copies / mL. A negative result does not preclude SARS-CoV-2 infection  and should not be used as the sole basis for treatment or other  patient management decisions.  A negative result may occur with  improper specimen collection / handling, submission of specimen other  than nasopharyngeal swab, presence of viral mutation(s) within the  areas targeted by this assay, and inadequate number of viral copies  (<250 copies / mL). A negative result must be combined with clinical  observations, patient history, and epidemiological information. If result is POSITIVE SARS-CoV-2 target nucleic acids are DETECTED. The SARS-CoV-2 RNA is generally detectable in upper and lower  respiratory specimens dur ing the acute phase of infection.  Positive  results are indicative of active infection with SARS-CoV-2.  Clinical  correlation with patient history and other diagnostic information is  necessary to determine patient infection status.  Positive results do  not rule out bacterial infection or co-infection with other viruses. If result is PRESUMPTIVE POSTIVE SARS-CoV-2 nucleic acids MAY BE PRESENT.   A presumptive positive result was obtained on the submitted specimen  and confirmed on repeat testing.  While 2019 novel  coronavirus  (SARS-CoV-2) nucleic acids may be present in the submitted sample  additional confirmatory testing may be necessary for epidemiological  and / or clinical management purposes  to differentiate between  SARS-CoV-2 and other Sarbecovirus currently known to infect humans.  If clinically indicated additional testing with an alternate test  methodology [phone]) is advised. The SARS-CoV-2 RNA is generally  detectable in upper and lower respiratory sp ecimens during the acute  phase of infection. The expected result is Negative. Fact Sheet for Patients:  StrictlyIdeas.no Fact Sheet for Healthcare Providers: BankingDealers.co.za This test is not yet approved or cleared by the [place] FDA and has been authorized for detection and/or diagnosis of SARS-CoV-2 by FDA under  an Emergency Use Authorization (EUA).  This EUA will remain in effect (meaning this test can be used) for the duration of the COVID-19 declaration under Section 564(b)(1) of the Act, 21 U.S.C. section 360bbb-3(b)(1), unless the authorization is terminated or revoked sooner. Performed at [facility], [place] [address], [place], [place] [address]   MRSA PCR Screening     Status: Abnormal   Collection Time: 01/29/19  9:40 AM  Result Value Ref Range Status   MRSA by PCR POSITIVE (A) NEGATIVE Final    Comment:        The GeneXpert MRSA Assay (FDA approved for NASAL specimens only), is one component of a comprehensive MRSA colonization surveillance program. It is not intended to diagnose MRSA infection nor to guide or monitor treatment for MRSA infections. RESULT CALLED TO, READ BACK BY AND VERIFIED WITH: H.MORRIS,RN [id number] @1137  BY V.WILKINS Performed at [facility] [address] [place], [place] [address]        Radiology Studies: No results found.    Scheduled Meds: . sodium chloride   Intravenous Once  .  carvedilol  25 mg Oral BID WC  . docusate sodium  100 mg Oral BID  . enoxaparin (LOVENOX) injection  40 mg Subcutaneous Q24H  . feeding supplement (ENSURE ENLIVE)  237 mL Oral BID BM  . folic acid  1 mg Oral Daily  . furosemide  40 mg Oral Daily  . ipratropium-albuterol  3 mL Nebulization BID  . levothyroxine  150 mcg Oral Q0600  . mouth rinse  15 mL Mouth Rinse BID  . multivitamin with minerals  1 tablet Oral Daily  . pantoprazole  40 mg Oral Daily  . polyethylene glycol  17 g Oral Daily  . sacubitril-valsartan  1 tablet Oral BID  . sodium chloride flush  10-40 mL Intracatheter Q12H  . spironolactone  25 mg Oral Daily  . vitamin B-12  50 mcg Oral Daily   Continuous Infusions: . sodium chloride 10 mL/hr at 02/06/19 0600     LOS: 15 days    Time spent: 25 minutes   Dessa Phi, DO [facility] [email or web address] 02/06/2019, 10:29 AM

## 2019-02-06 NOTE — Progress Notes (Signed)
Progress Note  Patient Name: [name] Date of Encounter: 02/06/2019  Primary Cardiologist: [name], MD   Subjective   Feels similar to yesterday, doesn't notice any changes. Didn't get AM medications of lasix, entresto, spironolactone, or carvedilol due to low BP.   Inpatient Medications    Scheduled Meds: . carvedilol  25 mg Oral BID WC  . docusate sodium  100 mg Oral BID  . enoxaparin (LOVENOX) injection  40 mg Subcutaneous Q24H  . feeding supplement (ENSURE ENLIVE)  237 mL Oral BID BM  . folic acid  1 mg Oral Daily  . furosemide  40 mg Oral Daily  . ipratropium-albuterol  3 mL Nebulization BID  . levothyroxine  150 mcg Oral Q0600  . mouth rinse  15 mL Mouth Rinse BID  . multivitamin with minerals  1 tablet Oral Daily  . pantoprazole  40 mg Oral Daily  . polyethylene glycol  17 g Oral Daily  . sacubitril-valsartan  1 tablet Oral BID  . sodium chloride flush  10-40 mL Intracatheter Q12H  . spironolactone  25 mg Oral Daily  . vitamin B-12  50 mcg Oral Daily   Continuous Infusions: . sodium chloride 10 mL/hr at 02/06/19 0600   PRN Meds: sodium chloride, albuterol, heparin lock flush, heparin lock flush, prochlorperazine   Vital Signs    Vitals:   02/06/19 0500 02/06/19 0503 02/06/19 0815 02/06/19 0848  BP:  (!) 89/52  (!) 96/41  Pulse:  64  61  Resp:  16    Temp:  97.8 F (36.6 C)    TempSrc:  Oral    SpO2:  98% 96% 94%  Weight: 85.4 kg     Height:        Intake/Output Summary (Last 24 hours) at 02/06/2019 1142 Last data filed at 02/06/2019 0600 Gross per 24 hour  Intake 304.56 ml  Output 1200 ml  Net -895.44 ml   Filed Weights   02/04/19 0500 02/05/19 0514 02/06/19 0500  Weight: 84.2 kg 84.5 kg 85.4 kg    Telemetry    15 beat run of NSVT overnight, otherwise sinus rhythm - Personally Reviewed  Physical Exam   GEN: No acute distress.   Neck: No JVD appreciated sitting upright Cardiac: RRR, no murmurs, rubs, or gallops.   Respiratory: Clear to auscultation bilaterally, no wheezes/ rales/ rhonchi appreciated today GI: NABS, Soft, nontender, non-distended  MS: No edema; SCDs in place, No deformity. Neuro:  Nonfocal, moving all extremities spontaneously Psych: Normal affect   Labs    Chemistry Recent Labs  Lab 02/04/19 0447 02/05/19 0350 02/06/19 0437  NA 140 141 140  K 4.6 4.7 4.5  CL 92* 94* 93*  CO2 41* 41* 43*  GLUCOSE 174* 209* 105*  BUN 25* 26* 31*  CREATININE 0.61 0.65 0.73  CALCIUM 9.1 8.5* 8.9  GFRNONAA >60 >60 >60  GFRAA >60 >60 >60  ANIONGAP 7 6 4*     Hematology Recent Labs  Lab 02/04/19 0447 02/05/19 0350 02/06/19 0437  WBC 9.3 12.9* 16.3*  RBC 2.77* 2.71* 2.76*  HGB 8.6* 8.3* 8.9*  HCT 28.6* 27.9* 28.3*  MCV 103.2* 103.0* 102.5*  MCH 31.0 30.6 32.2  MCHC 30.1 29.7* 31.4  RDW 17.5* 17.6* 17.9*  PLT 77* 86* 100*    Cardiac EnzymesNo results for input(s): TROPONINI in the last 168 hours. No results for input(s): TROPIPOC in the last 168 hours.   BNPNo results for input(s): BNP, PROBNP in the last 168 hours.  DDimer No results for input(s): DDIMER in the last 168 hours.   Radiology    No results found.  Cardiac Studies   Echocardiogram 01/28/2019: 1. The left ventricle has severely reduced systolic function, with an ejection fraction of 20-25%. The cavity size was severely dilated. Left ventricular diastolic Doppler parameters are consistent with pseudonormalization. Elevated left ventricular  end-diastolic pressure Left ventricular diffuse hypokinesis. No evidence of LV thrombus by definity contrast study. 2. The right ventricle has normal systolic function. The cavity was normal. There is no increase in right ventricular wall thickness. Right ventricular systolic pressure is moderately elevated with an estimated pressure of 54.9 mmHg. 3. Left atrial size was moderately dilated. 4. There is mild mitral annular calcification present. Mitral valve regurgitation  is mild by color flow Doppler. 5. The aortic valve is tricuspid. There is trivial AR. 6. The inferior vena cava was dilated in size with <50% respiratory variability.  Patient Profile     73 y.o. 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 Maylene Roes   Assessment & Plan    1. Acute on chronic combined CHF: patient presented with acute hypoxic respiratory failure felt to be multifactorial 2/2 severe anemia, acute lung injury, and CHF. She was hypoxic requiring intermittent BiPAP and NRB. Also requiring milrinone. Initially diuresed with aggressive IV lasix and transitioned to po yesterday. She was started on entresto and spironolactone this admission, in addition to home carvedilol. Her BP was soft this morning and her medications were held.  - Continue entresto. Prioritizing this as a medication. - Decreased spironolactone today, may need to stop if BP remains low - tolerated carvedilol 25 mg BID at home. Has occasional NSVT, would like to remain on highest tolerated dose of BB if BP allows - transitioned back to home dose furosemide oral dosing. - To discuss DNR wishes with Dr. Lovena Le and Dr. Acie Fredrickson at follow-up visit given ICD status. Has discussed CRT with Dr. Lovena Le in the past as well.  2. MDS: WBC up to 16.3 today - s/p steroids. PLT 100.  - Continue management per primary team.  3. HTN: BP soft this morning and carvedilol, lasix, entresto, and spironolactone were held.   -Need to prioritize entresto. Would only hold if SBP<90. -decreased spironolactone today to 12.5 mg, hold for SBP <100 -trying to maximize BB dose, but if BP remains low may need to adjust or transition to metoprolol succinate  For questions or updates, please contact [place] Please consult [email or web address] for contact info under  Cardiology/STEMI.   Note prepared by Abigail Butts, PA-C  02/06/2019, 11:42 AM   [phone]  Note edited and signed by Buford Dresser, MD, PhD [facility]  [address] [place], [place] [address] [phone]

## 2019-02-06 NOTE — Progress Notes (Signed)
Manual BP 96/41, HR 61. Patient has Coreg, Lasix, Spironolactone, Lasix, and Entresto scheduled at this time. Patient denies dizziness. Dr. Maylene Roes made aware and order received to hold these medications until Cardiology sees patient today.   [name], [place] 02/06/2019 8:57 AM

## 2019-02-06 NOTE — TOC Progression Note (Signed)
Transition of Care [facility]) - Progression Note    Patient Details  Name: [name] MRN: [id number] Date of Birth: [birth year]  Transition of Care [facility]) CM/SW [place], [place] Phone Number: 02/06/2019, 3:33 PM  Clinical Narrative:   Patient has bed at [facility] once medically cleared for discharge     Expected Discharge Plan: [place] Barriers to Discharge: Continued Medical Work up  Expected Discharge Plan and Services Expected Discharge Plan: [place]   Discharge Planning Services: CM Consult   Living arrangements for the past 2 months: Single Family Home Expected Discharge Date: (unknown)                                     Social Determinants of Health (SDOH) Interventions    Readmission Risk Interventions Readmission Risk Prevention Plan 01/29/2019  Transportation Screening Complete  PCP or Specialist Appt within 3-5 Days Not Complete  Some recent data might be hidden

## 2019-02-07 ENCOUNTER — Telehealth: Payer: Self-pay | Admitting: Cardiology

## 2019-02-07 DIAGNOSIS — Z79899 Other long term (current) drug therapy: Secondary | ICD-10-CM

## 2019-02-07 DIAGNOSIS — R0902 Hypoxemia: Secondary | ICD-10-CM

## 2019-02-07 LAB — CBC WITH DIFFERENTIAL/PLATELET
Abs Immature Granulocytes: 1.84 10*3/uL — ABNORMAL HIGH (ref 0.00–0.07)
Basophils Absolute: 0 10*3/uL (ref 0.0–0.1)
Basophils Relative: 0 %
Eosinophils Absolute: 0 10*3/uL (ref 0.0–0.5)
Eosinophils Relative: 0 %
HCT: 25.7 % — ABNORMAL LOW (ref 36.0–46.0)
Hemoglobin: 7.9 g/dL — ABNORMAL LOW (ref 12.0–15.0)
Immature Granulocytes: 12 %
Lymphocytes Relative: 18 %
Lymphs Abs: 2.7 10*3/uL (ref 0.7–4.0)
MCH: 31.6 pg (ref 26.0–34.0)
MCHC: 30.7 g/dL (ref 30.0–36.0)
MCV: 102.8 fL — ABNORMAL HIGH (ref 80.0–100.0)
Monocytes Absolute: 2.4 10*3/uL — ABNORMAL HIGH (ref 0.1–1.0)
Monocytes Relative: 16 %
Neutro Abs: 7.9 10*3/uL — ABNORMAL HIGH (ref 1.7–7.7)
Neutrophils Relative %: 54 %
Platelets: 93 10*3/uL — ABNORMAL LOW (ref 150–400)
RBC: 2.5 MIL/uL — ABNORMAL LOW (ref 3.87–5.11)
RDW: 18.1 % — ABNORMAL HIGH (ref 11.5–15.5)
WBC: 14.9 10*3/uL — ABNORMAL HIGH (ref 4.0–10.5)
nRBC: 4.3 % — ABNORMAL HIGH (ref 0.0–0.2)

## 2019-02-07 LAB — BASIC METABOLIC PANEL
Anion gap: 5 (ref 5–15)
BUN: 30 mg/dL — ABNORMAL HIGH (ref 8–23)
CO2: 41 mmol/L — ABNORMAL HIGH (ref 22–32)
Calcium: 8.5 mg/dL — ABNORMAL LOW (ref 8.9–10.3)
Chloride: 94 mmol/L — ABNORMAL LOW (ref 98–111)
Creatinine, Ser: 0.7 mg/dL (ref 0.44–1.00)
GFR calc Af Amer: >60 mL/min (ref >60)
GFR calc non Af Amer: >60 mL/min (ref >60)
Glucose, Bld: 122 mg/dL — ABNORMAL HIGH (ref 70–99)
Potassium: 4.4 mmol/L (ref 3.5–5.1)
Sodium: 140 mmol/L (ref 135–145)

## 2019-02-07 MED ORDER — SODIUM CHLORIDE 0.9 % IV BOLUS
250.0000 mL | Freq: Once | INTRAVENOUS | Status: AC
  Administered 2019-02-07: 250 mL via INTRAVENOUS

## 2019-02-07 NOTE — Telephone Encounter (Signed)
The pt has not been discharged from the hospital yet. We will continue to monitor and call once she has been discharged.

## 2019-02-07 NOTE — Progress Notes (Signed)
Physical Therapy Treatment Patient Details Name: [name] MRN: [id number] DOB: Dec 29, 1945 Today's Date: 02/07/2019    History of Present Illness 73 yo female admitted to ED on 5/11 with ShOB, hypoxia, with PNA sent from MD. Transferred to ICU-on BIPAP vs HFNC. 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 PMH includes pacemaker, GERD, EF 25-30% 2010, MVR, cardiomyopathy, depression, CHF.     PT Comments    Increased time to complete bed mobility, mod I.  Pt limited to short duration gait prior fatigue.  Able to maintain 100% O2 saturation with 4L O2 A via nasal cannula during whole session.  Used RW for safety during gait and mainly verbal cueing and min guard/A for mobility with sit to stand and to control descent to chair.  EOS pt left in chair alarm set and call bell within reach, RN aware of status.  No reports of pain through session.       Follow Up Recommendations  SNF     Equipment Recommendations  None recommended by PT    Recommendations for Other Services       Precautions / Restrictions Precautions Precautions: Fall Precaution Comments: monitor O2 Restrictions Weight Bearing Restrictions: No    Mobility  Bed Mobility Overal bed mobility: Modified Independent Bed Mobility: Supine to Sit     Supine to sit: Min guard;HOB elevated     General bed mobility comments: increased time  Transfers Overall transfer level: Modified independent Equipment used: Rolling walker (2 wheeled) Transfers: Sit to/from Stand Sit to Stand: Min guard;Min assist         General transfer comment: verbal cueing for hand placement to assist with safe mechanics, assist to raise and control descent  Ambulation/Gait Ambulation/Gait assistance: Min assist;+2 safety/equipment Gait Distance (Feet): 35 Feet Assistive device: Rolling walker (2 wheeled) Gait Pattern/deviations: Step-through pattern;Decreased stride length Gait velocity: decr    General Gait Details: slow cadence, no LOB episodes.   Fatigue upon return to room   Stairs             Wheelchair Mobility    Modified Rankin (Stroke Patients Only)       Balance Overall balance assessment: Needs assistance         Standing balance support: Bilateral upper extremity supported Standing balance-Leahy Scale: Poor                              Cognition Arousal/Alertness: Awake/alert Behavior During Therapy: WFL for tasks assessed/performed Overall Cognitive Status: Within Functional Limits for tasks assessed                                        Exercises      General Comments        Pertinent Vitals/Pain Pain Assessment: No/denies pain    Home Living                      Prior Function            PT Goals (current goals can now be found in the care plan section)      Frequency    Min 3X/week      PT Plan Current plan remains appropriate    Co-evaluation              AM-PAC PT "6 Clicks" Mobility   Outcome Measure  Help needed turning from your back to your side while in a flat bed without using bedrails?: A Little Help needed moving from lying on your back to sitting on the side of a flat bed without using bedrails?: A Little Help needed moving to and from a bed to a chair (including a wheelchair)?: A Little Help needed standing up from a chair using your arms (e.g., wheelchair or bedside chair)?: A Little Help needed to walk in hospital room?: A Little Help needed climbing 3-5 steps with a railing? : A Little 6 Click Score: 18    End of Session Equipment Utilized During Treatment: Oxygen;Gait belt Activity Tolerance: Patient tolerated treatment well Patient left: in chair;with call bell/phone within reach;with chair alarm set Nurse Communication: Mobility status PT Visit Diagnosis: Muscle weakness (generalized) (M62.81);Difficulty in walking, not elsewhere classified (R26.2);Unsteadiness on feet (R26.81)     Time:  2683-4196 PT Time Calculation (min) (ACUTE ONLY): 13 min  Charges:  $Gait Training: 8-22 mins                     [address] LPTA; [place]  Aldona Lento 02/07/2019, 3:29 PM

## 2019-02-07 NOTE — Progress Notes (Signed)
PROGRESS NOTE    [name]  DQQ:[id number] DOB: 07-Aug-1946 DOA: 01/22/2019 PCP: Elby Showers, MD    Brief Narrative:  [name] is a 73 year old female who presented with dyspnea and weakness. She does have significant past medical history for myelodysplastic syndrome andnonischemic cardiomyopathy, systolic dysfunction ejection fraction 25 to 30%.She was seen by her primary care provider on the day of admission, she was found hypoxic down to 80% on room air and her hemoglobin was down to 6.1, she was placed on supplemental oxygen and transferred to the hospital for further evaluation. She physical examination blood pressure was 103/35, heart rate 77, respiratory rate 20, temperature 98.4, oxygen saturation 96%,her lungs were showing rhonchi bilaterally, heart S1-S2 present and rhythmic, abdomen was soft, no lower extremity edema. Sodium 132, potassium 3.6, chloride 83, bicarb 31, glucose 122, BUN 22, creatinine 1.0, white count 3.8, hemoglobin 6.1, hematocrit 19.0, platelets 65.Urinalysis negative for infection.Her chest x-ray had bilateral nodular infiltrates. SARS COVID-19 negative.  Patient was admitted to hospital working diagnosis of acute hypoxic respiratory failure due to multifocal pneumonia and was started on IV Rocephin and Zithromax. PCCM and cardiology were also consulted due to worsening respiratory distress.  She was found to have acute exacerbation of systolic heart failure and started on IV Lasix and milrinone.  She has also been on and off BiPAP.  Patient is currently improving with labile blood pressure.   Assessment & Plan:   Principal Problem:   Acute respiratory failure with hypoxia (HCC) Active Problems:   Hypothyroidism   Essential hypertension   Symptomatic anemia   MDS (myelodysplastic syndrome) (HCC)   Multifocal pneumonia   Respiratory failure (HCC)   Dyspnea   Congestive dilated cardiomyopathy (HCC)   Acute systolic heart failure (HCC)    Acute on chronic systolic CHF (congestive heart failure), NYHA class 4 (HCC)  Acute hypoxic respiratory failure secondary to acute on chronic systolic congestive heart failure and acute lung injury: Initially treated as pneumonia with Rocephin and azithromycin.  Pneumonia ruled out.  COVID-19 x2-.  Respiratory virus panel was negative.  She is currently off antibiotics. Treated for acute exacerbation of chronic systolic heart failure.  Now off milrinone.  She was treated with IV Lasix that is been converted to oral Lasix now.  Followed by heart failure team.  Currently on Entresto, Aldactone and Coreg.  She had some drop in blood pressure overnight, was given 250 mL of normal saline.  She has tolerated morning medications.  Acute lung injury: Treated with high-dose IV steroids and finished the therapy.  Occasionally on nasal cannula oxygen but mostly improved.  Pancytopenia secondary to MDS: Received total 4 units of PRBC during this admission.  Clinically improving.  Hypertension: Blood pressures fluctuating and labile.  On heart failure regimen as above.  Hypothyroidism: Continue Synthroid.   DVT prophylaxis: Lovenox Code Status: DNR Family Communication: None Disposition Plan: Continue inpatient today.  If blood pressures remained stable, anticipate discharge to a skilled nursing facility tomorrow.   Consultants:   PCCM, signed up  Cardiology  Procedures:   None  Antimicrobials:   None, finished antibiotic therapy.   Subjective: Patient was seen and examined.  Reported blood pressures less than 80 and was given 250 mL of normal saline last night.  No other overnight events.  Denies any chest pain or shortness of breath.  On and off on 2 L nasal cannula.  Objective: Vitals:   02/07/19 0304 02/07/19 0540 02/07/19 0758 02/07/19 0849  BP: Marland Kitchen)  98/40 (!) 105/50  (!) 108/48  Pulse: 63 69  75  Resp: 18 18    Temp: 98 F (36.7 C) 98.3 F (36.8 C)    TempSrc:      SpO2:  100% 100% 98%   Weight:      Height:        Intake/Output Summary (Last 24 hours) at 02/07/2019 1346 Last data filed at 02/07/2019 0900 Gross per 24 hour  Intake 733.34 ml  Output 350 ml  Net 383.34 ml   Filed Weights   02/04/19 0500 02/05/19 0514 02/06/19 0500  Weight: 84.2 kg 84.5 kg 85.4 kg    Examination:  General exam: Appears calm and comfortable, comfortable on nasal cannula oxygen. Respiratory system: Clear to auscultation. Respiratory effort normal.  No added sounds. Cardiovascular system: S1 & S2 heard, RRR. No JVD, murmurs, rubs, gallops or clicks. No pedal edema. Gastrointestinal system: Abdomen is nondistended, soft and nontender. No organomegaly or masses felt. Normal bowel sounds heard.  Obese and pendulous. Central nervous system: Alert and oriented. No focal neurological deficits. Extremities: Symmetric 5 x 5 power. Skin: No rashes, lesions or ulcers Psychiatry: Judgement and insight appear normal. Mood & affect appropriate.     Data Reviewed: I have personally reviewed following labs and imaging studies  CBC: Recent Labs  Lab 02/03/19 0430 02/04/19 0447 02/05/19 0350 02/06/19 0437 02/07/19 0325  WBC 6.9 9.3 12.9* 16.3* 14.9*  NEUTROABS 4.1 5.4 8.1* 8.2* 7.9*  HGB 8.6* 8.6* 8.3* 8.9* 7.9*  HCT 28.3* 28.6* 27.9* 28.3* 25.7*  MCV 103.7* 103.2* 103.0* 102.5* 102.8*  PLT 70* 77* 86* 100* 93*   Basic Metabolic Panel: Recent Labs  Lab 02/01/19 0416  02/03/19 0430 02/04/19 0447 02/05/19 0350 02/06/19 0437 02/07/19 0325  NA 141   < > 141 140 141 140 140  K 3.5   < > 4.4 4.6 4.7 4.5 4.4  CL 91*   < > 91* 92* 94* 93* 94*  CO2 43*   < > 43* 41* 41* 43* 41*  GLUCOSE 154*   < > 170* 174* 209* 105* 122*  BUN 32*   < > 26* 25* 26* 31* 30*  CREATININE 0.70   < > 0.62 0.61 0.65 0.73 0.70  CALCIUM 8.1*   < > 8.7* 9.1 8.5* 8.9 8.5*  MG 2.4  --   --   --   --   --   --    < > = values in this interval not displayed.   GFR: Estimated Creatinine Clearance:  65.8 mL/min (by C-G formula based on SCr of 0.7 mg/dL). Liver Function Tests: No results for input(s): AST, ALT, ALKPHOS, BILITOT, PROT, ALBUMIN in the last 168 hours. No results for input(s): LIPASE, AMYLASE in the last 168 hours. No results for input(s): AMMONIA in the last 168 hours. Coagulation Profile: No results for input(s): INR, PROTIME in the last 168 hours. Cardiac Enzymes: No results for input(s): CKTOTAL, CKMB, CKMBINDEX, TROPONINI in the last 168 hours. BNP (last 3 results) No results for input(s): PROBNP in the last 8760 hours. HbA1C: No results for input(s): HGBA1C in the last 72 hours. CBG: No results for input(s): GLUCAP in the last 168 hours. Lipid Profile: No results for input(s): CHOL, HDL, LDLCALC, TRIG, CHOLHDL, LDLDIRECT in the last 72 hours. Thyroid Function Tests: No results for input(s): TSH, T4TOTAL, FREET4, T3FREE, THYROIDAB in the last 72 hours. Anemia Panel: No results for input(s): VITAMINB12, FOLATE, FERRITIN, TIBC, IRON, RETICCTPCT in the last 72  hours. Sepsis Labs: No results for input(s): PROCALCITON, LATICACIDVEN in the last 168 hours.  Recent Results (from the past 240 hour(s))  MRSA PCR Screening     Status: Abnormal   Collection Time: 01/29/19  9:40 AM  Result Value Ref Range Status   MRSA by PCR POSITIVE (A) NEGATIVE Final    Comment:        The GeneXpert MRSA Assay (FDA approved for NASAL specimens only), is one component of a comprehensive MRSA colonization surveillance program. It is not intended to diagnose MRSA infection nor to guide or monitor treatment for MRSA infections. RESULT CALLED TO, READ BACK BY AND VERIFIED WITH: H.MORRIS,RN [id number] @1137  BY V.WILKINS Performed at [facility] [address] [place], [place] [address]          Radiology Studies: No results found.      Scheduled Meds: . carvedilol  25 mg Oral BID WC  . docusate sodium  100 mg Oral BID  . enoxaparin (LOVENOX) injection   40 mg Subcutaneous Q24H  . feeding supplement (ENSURE ENLIVE)  237 mL Oral BID BM  . folic acid  1 mg Oral Daily  . furosemide  40 mg Oral Daily  . ipratropium-albuterol  3 mL Nebulization BID  . levothyroxine  150 mcg Oral Q0600  . mouth rinse  15 mL Mouth Rinse BID  . multivitamin with minerals  1 tablet Oral Daily  . pantoprazole  40 mg Oral Daily  . polyethylene glycol  17 g Oral Daily  . sacubitril-valsartan  1 tablet Oral BID  . sodium chloride flush  10-40 mL Intracatheter Q12H  . spironolactone  12.5 mg Oral Daily  . vitamin B-12  50 mcg Oral Daily   Continuous Infusions: . sodium chloride Stopped (02/06/19 1220)     LOS: 16 days    Time spent: 25 minutes    Barb Merino, MD [facility] Pager [phone]  If 7PM-7AM, please contact night-coverage [email or web address] Password [email or web address] 02/07/2019, 1:46 PM

## 2019-02-07 NOTE — Progress Notes (Signed)
Progress Note  Patient Name: [name] Date of Encounter: 02/07/2019  Primary Cardiologist: [name], MD   Subjective   Up and walking with therapy today. Nearing placement for rehab. Feeling a little stronger every day but not back to her baseline.  Inpatient Medications    Scheduled Meds: . carvedilol  25 mg Oral BID WC  . docusate sodium  100 mg Oral BID  . enoxaparin (LOVENOX) injection  40 mg Subcutaneous Q24H  . feeding supplement (ENSURE ENLIVE)  237 mL Oral BID BM  . folic acid  1 mg Oral Daily  . furosemide  40 mg Oral Daily  . ipratropium-albuterol  3 mL Nebulization BID  . levothyroxine  150 mcg Oral Q0600  . mouth rinse  15 mL Mouth Rinse BID  . multivitamin with minerals  1 tablet Oral Daily  . pantoprazole  40 mg Oral Daily  . polyethylene glycol  17 g Oral Daily  . sacubitril-valsartan  1 tablet Oral BID  . sodium chloride flush  10-40 mL Intracatheter Q12H  . spironolactone  12.5 mg Oral Daily  . vitamin B-12  50 mcg Oral Daily   Continuous Infusions: . sodium chloride Stopped (02/06/19 1220)   PRN Meds: sodium chloride, albuterol, heparin lock flush, heparin lock flush, prochlorperazine   Vital Signs    Vitals:   02/07/19 0304 02/07/19 0540 02/07/19 0758 02/07/19 0849  BP: (!) 98/40 (!) 105/50  (!) 108/48  Pulse: 63 69  75  Resp: 18 18    Temp: 98 F (36.7 C) 98.3 F (36.8 C)    TempSrc:      SpO2: 100% 100% 98%   Weight:      Height:        Intake/Output Summary (Last 24 hours) at 02/07/2019 0900 Last data filed at 02/07/2019 0600 Gross per 24 hour  Intake 933.34 ml  Output 350 ml  Net 583.34 ml   Filed Weights   02/04/19 0500 02/05/19 0514 02/06/19 0500  Weight: 84.2 kg 84.5 kg 85.4 kg    Telemetry    SR, occ PVCs, 7 beats of NSVT - Personally Reviewed  Physical Exam   Physical exam per MD:  GEN: No acute distress.  Sitting upright in chair, [place] in place Neck: No JVD visible sitting upright Cardiac: RRR, no  murmurs, rubs, or gallops.  Respiratory: Clear to auscultation bilaterally except for faint bibasilar crackles GI: NABS, Soft, nontender, non-distended  MS: Trace bl LE edema; No deformity. Neuro:  Nonfocal, moving all extremities spontaneously Psych: Normal affect   Labs    Chemistry Recent Labs  Lab 02/05/19 0350 02/06/19 0437 02/07/19 0325  NA 141 140 140  K 4.7 4.5 4.4  CL 94* 93* 94*  CO2 41* 43* 41*  GLUCOSE 209* 105* 122*  BUN 26* 31* 30*  CREATININE 0.65 0.73 0.70  CALCIUM 8.5* 8.9 8.5*  GFRNONAA >60 >60 >60  GFRAA >60 >60 >60  ANIONGAP 6 4* 5     Hematology Recent Labs  Lab 02/05/19 0350 02/06/19 0437 02/07/19 0325  WBC 12.9* 16.3* 14.9*  RBC 2.71* 2.76* 2.50*  HGB 8.3* 8.9* 7.9*  HCT 27.9* 28.3* 25.7*  MCV 103.0* 102.5* 102.8*  MCH 30.6 32.2 31.6  MCHC 29.7* 31.4 30.7  RDW 17.6* 17.9* 18.1*  PLT 86* 100* 93*    Cardiac EnzymesNo results for input(s): TROPONINI in the last 168 hours. No results for input(s): TROPIPOC in the last 168 hours.   BNPNo results for input(s): BNP,  PROBNP in the last 168 hours.   DDimer No results for input(s): DDIMER in the last 168 hours.   Radiology    No results found.  Cardiac Studies   Echocardiogram 01/28/2019: 1. The left ventricle has severely reduced systolic function, with an ejection fraction of 20-25%. The cavity size was severely dilated. Left ventricular diastolic Doppler parameters are consistent with pseudonormalization. Elevated left ventricular  end-diastolic pressure Left ventricular diffuse hypokinesis. No evidence of LV thrombus by definity contrast study. 2. The right ventricle has normal systolic function. The cavity was normal. There is no increase in right ventricular wall thickness. Right ventricular systolic pressure is moderately elevated with an estimated pressure of 54.9 mmHg. 3. Left atrial size was moderately dilated. 4. There is mild mitral annular calcification present. Mitral valve  regurgitation is mild by color flow Doppler. 5. The aortic valve is tricuspid. There is trivial AR. 6. The inferior vena cava was dilated in size with <50% respiratory variability.  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 Acute on chronic combined CHF: patient presented with acute hypoxic respiratory failure felt to be multifactorial 2/2 severe anemia, acute lung injury, and CHF. She was hypoxic requiring intermittent BiPAP and NRB. Also requiring milrinone. Initially diuresed with aggressive IV lasix and transitioned to po 02/05/2019. She was started on entresto and spironolactone this admission, in addition to home carvedilol. Given soft BP's 02/06/2019, her spironolactone was decreased to 12.5mg  daily. BP still on the soft side with hypotension to 72/58 overnight. - Continue entresto. Prioritizing this as a medication. - Continue spironolactone at lower dose - hold for SBP <100 - Continue carvedilol 25 mg BID. Has occasional NSVT, therefore would like to remain on highest tolerated dose of BB if BP allows - Continue home dose furosemide oral dosing - To discuss DNR wishes with Dr. Lovena Le and Dr. Acie Fredrickson at follow-up visit given ICD status. Has discussed CRT with Dr. Lovena Le in the past as well. - has received HF education including sodium and fluid restriction, daily weights, etc  2. MDS: WBC trending down to 14.9 today - s/p steroids. PLT 93.  - Continue management per primary team.  3. HTN: BP continues to be on the soft side this morning though improved from yesterday - Continue to prioritize entresto. Would only hold if SBP<90. - Continue spironolactone at lower dose - hold for SBP <100 - trying to maximize BB dose, but if BP remains low may need to adjust or transition to  metoprolol succinate  [facility] will sign off in anticipation of possible discharge to rehab facility tomorrow.  Medication Recommendations: cardiac medications as currently ordered Other recommendations (labs, testing, etc):  BMET weekly in rehab Follow up as an outpatient:  Has TOC appt with Daune Perch on 02/19/19  TIME SPENT WITH PATIENT: 25 minutes of direct patient care. More than 50% of that time was spent on coordination of care and counseling regarding medication discussion for potential upcoming discharge.  Buford Dresser, MD, PhD [facility]   For questions or updates, please contact [place] Please consult [email or web address] for contact info under Cardiology/STEMI.   Note prepared by: Abigail Butts, PA-C  02/07/2019, 9:00 AM   [phone]  Edited and signed by: Buford Dresser, MD, PhD [facility]  [facility]  [address] [place], [place] [address] [phone]

## 2019-02-07 NOTE — Care Management Important Message (Signed)
Important Message  Patient Details IM Letter given to Rhea Pink to present to the Patient Name: [name] MRN: [id number] Date of Birth: 10/10/1945   Medicare Important Message Given:  Yes    Jessieca, [place]hem 02/07/2019, 11:44 AM

## 2019-02-07 NOTE — Telephone Encounter (Signed)
New Message    Pt has TOC appt per Daleen Snook with Daune Perch on 02/19/19 at 11am

## 2019-02-07 NOTE — Progress Notes (Signed)
Nutrition Follow-up  RD working remotely.   DOCUMENTATION CODES:   Obesity unspecified  INTERVENTION:  - continue Ensure Enlive BID. - continue to encourage PO intakes.    NUTRITION DIAGNOSIS:   Inadequate oral intake related to acute illness, lethargy/confusion as evidenced by per patient/family report, meal completion < 50%. -improving  GOAL:   Patient will meet greater than or equal to 90% of their needs -likely at least minimally meeting on average  MONITOR:   PO intake, Supplement acceptance, Weight trends  ASSESSMENT:   72 year old female who presented with dyspnea and weakness. She does have significant past medical history for myelodysplastic syndrome and nonischemic cardiomyopathy systolic dysfunction. She was seen by her PCP on the day of admission (5/11), she was found hypoxic down to 80% on room air and her hemoglobin was down to 6.1, she was placed on supplemental oxygen and transferred to the hospital for further evaluation. CXR showed bilateral nodular infiltrates. COVID-19 negative. She was admitted with working diagnosis of acute hypoxic respiratory failure d/t multifocal PNA. PCCM and Cardiology were also consulted d/t worsening respiratory distress. She was found to have acute exacerbation of CHF and started on IV Lasix and milrinone. She has been on and off BiPAP.  Current weight consistent with admission weight. Weight had been trending down but the past few days has been trending back up. Per RN flow sheet, patient has mild edema to BLE. Per flow sheet she has been eating more since RD visit on 5/21. She consumed 100% of breakfast, 50% of lunch, and 25% of dinner on 5/25; 75% of breakfast and 50% of lunch and dinner on 5/26; 75% of breakfast this AM. Patient had previously been eating 0-25% of most meals. Attempted to reach patient by phone x2 this AM but was unsuccessful. Per review of orders, she has accepted 9 of 11 bottles of Ensure offered. Will continue to  monitor intakes and if ONS need to be further adjusted.   Per notes: ongoing care for acute respiratory failure and plans to work toward weaning off O2 as able; plan is for SNF at d/c.     Medications reviewed; 100 mg colace BID, 1 mg folvite/day, 40 mg oral lasix/day, 150 mcg oral synthroid/day, daily multivitamin with minerals, 1 packet miralax, 12.5 mg aldactone/day, 50 mcg oral cyanocobalamin/day.  Labs reviewed; Cl: 94 mmol/l, BUN: 30 mg/dl, Ca: 8.5 mg/dl.    Diet Order:   Diet Order            Diet regular Room service appropriate? Yes; Fluid consistency: Thin  Diet effective now              EDUCATION NEEDS:   No education needs have been identified at this time  Skin:  Skin Assessment: Reviewed RN Assessment  Last BM:  5/26  Height:   Ht Readings from Last 1 Encounters:  01/29/19 5\' 3"  (1.6 m)    Weight:   Wt Readings from Last 1 Encounters:  02/06/19 85.4 kg    Ideal Body Weight:  52.3 kg  BMI:  Body mass index is 33.35 kg/m.  Estimated Nutritional Needs:   Kcal:  1640-1880 kcal  Protein:  95-105 grams  Fluid:  >/= 1.5 L/day      Jarome Matin, MS, RD, LDN, [facility] Inpatient Clinical Dietitian Pager # [phone] After hours/weekend pager # [phone]

## 2019-02-07 NOTE — Progress Notes (Signed)
Occupational Therapy Treatment Patient Details Name: [name] MRN: [id number] DOB: August 21, 1946 Today's Date: 02/07/2019    History of present illness 73 yo female admitted to ED on 5/11 with ShOB, hypoxia, with PNA sent from MD. Transferred to ICU-on BIPAP vs HFNC. 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 PMH includes pacemaker, GERD, EF 25-30% 2010, MVR, cardiomyopathy, depression, CHF.    OT comments  Pt performed bathing this OT session, as well as washing hair with shampoo cap, as well as brushing teeth.    Follow Up Recommendations  SNF    Equipment Recommendations  None recommended by OT    Recommendations for Other Services      Precautions / Restrictions Precautions Precautions: Fall Precaution Comments: monitor O2       Mobility Bed Mobility               General bed mobility comments: pt in chair  Transfers Overall transfer level: Needs assistance Equipment used: Rolling walker (2 wheeled) Transfers: Sit to/from Stand Sit to Stand: Min assist         General transfer comment: Assist to rise, stabilize, control descent. VCs safety, technique, hand placement.     Balance Overall balance assessment: Needs assistance         Standing balance support: Bilateral upper extremity supported Standing balance-Leahy Scale: Poor                             ADL either performed or assessed with clinical judgement   ADL Overall ADL's : Needs assistance/impaired Eating/Feeding: Sitting   Grooming: Standing;Set up   Upper Body Bathing: Set up;Sitting   Lower Body Bathing: Minimal assistance;Sit to/from stand   Upper Body Dressing : Set up;Sitting   Lower Body Dressing: Minimal assistance;Sit to/from stand;Cueing for sequencing;Cueing for safety   Toilet Transfer: Minimal assistance;Stand-pivot   Toileting- Clothing Manipulation and Hygiene: Minimal assistance;Sit to/from stand;With caregiver independent assisting;Cueing for safety;Cueing for  sequencing         General ADL Comments: pt does not have a BSC in room.  OT notified staff on floor.  OT unable to find a BSC on floor at this time- will continue to look for [facility] for pt               Cognition Arousal/Alertness: Awake/alert Behavior During Therapy: [facility] for tasks assessed/performed Overall Cognitive Status: Within Functional Limits for tasks assessed                                               Frequency  Min 2X/week        Progress Toward Goals  OT Goals(current goals can now be found in the care plan section)  Progress towards OT goals: Progressing toward goals     Plan Discharge plan remains appropriate       AM-PAC OT "6 Clicks" Daily Activity     Outcome Measure   Help from another person eating meals?: None Help from another person taking care of personal grooming?: A Little Help from another person toileting, which includes using toliet, bedpan, or urinal?: A Little Help from another person bathing (including washing, rinsing, drying)?: A Little Help from another person to put on and taking off regular upper body clothing?: A Little Help from another person to put on and taking off regular  lower body clothing?: A Lot 6 Click Score: 18    End of Session Equipment Utilized During Treatment: Rolling walker  OT Visit Diagnosis: Unsteadiness on feet (R26.81)   Activity Tolerance Patient limited by fatigue   Patient Left in chair;with call bell/phone within reach;with chair alarm set   Nurse Communication          Time: 1445-1510 OT Time Calculation (min): 25 min  Charges: OT General Charges $OT Visit: 1 Visit OT Treatments $Self Care/Home Management : 23-37 mins  Kari Baars, [place] [phone] Office- [phone]      Venicia Vandall, Edwena Felty D 02/07/2019, 3:23 PM

## 2019-02-07 NOTE — Discharge Instructions (Signed)
YOUR CARDIOLOGY TEAM HAS ARRANGED FOR AN E-VISIT FOR YOUR APPOINTMENT - PLEASE REVIEW IMPORTANT INFORMATION BELOW SEVERAL DAYS PRIOR TO YOUR APPOINTMENT  Due to the recent COVID-19 pandemic, we are transitioning in-person office visits to tele-medicine visits in an effort to decrease unnecessary exposure to our patients, their families, and staff. These visits are billed to your insurance just like a normal visit is. We also encourage you to sign up for MyChart if you have not already done so. You will need a smartphone if possible. For patients that do not have this, we can still complete the visit using a regular telephone but do prefer a smartphone to enable video when possible. You may have a family member that lives with you that can help. If possible, we also ask that you have a blood pressure cuff and scale at home to measure your blood pressure, heart rate and weight prior to your scheduled appointment. Patients with clinical needs that need an in-person evaluation and testing will still be able to come to the office if absolutely necessary. If you have any questions, feel free to call our office.     YOUR PROVIDER WILL BE USING ONE OF THE FOLLOWING PLATFORMS TO COMPLETE YOUR VISIT:    IF USING MYCHART - How to Download the MyChart App to Your SmartPhone   - If Apple, go to [employer] and type in MyChart in the search bar and download the app. If Android, ask patient to go to [employer] and type in [place] in the search bar and download the app. The app is free but as with any other app downloads, your phone may require you to verify saved payment information or Apple/Android password.  - You will need to then log into the app with your MyChart username and password, and select [facility] as your healthcare provider to link the account.  - When it is time for your visit, go to the MyChart app, find appointments, and click Begin Video Visit. Be sure to Select Allow for your device to  access the Microphone and Camera for your visit. You will then be connected, and your provider will be with you shortly.  **If you have any issues connecting or need assistance, please contact MyChart service desk [phone])**  **If using a computer, in order to ensure the best quality for your visit, you will need to use either of the following Internet Browsers: Insurance underwriter or [employer] Edge**   IF USING DOXIMITY or [email or web address].ME - The staff will give you instructions on receiving your link to join the meeting the day of your visit.      2-3 DAYS BEFORE YOUR APPOINTMENT  You will receive a telephone call from one of our [place] team members - your caller ID may say "Unknown caller." If this is a video visit, we will walk you through how to get the video launched on your phone. We will remind you check your blood pressure, heart rate and weight prior to your scheduled appointment. If you have an Apple Watch or Kardia, please upload any pertinent ECG strips the day before or morning of your appointment to [place]. Our staff will also make sure you have reviewed the consent and agree to move forward with your scheduled tele-health visit.     THE DAY OF YOUR APPOINTMENT  Approximately 15 minutes prior to your scheduled appointment, you will receive a telephone call from one of [place] team - your caller ID may say "Unknown  caller."  Our staff will confirm medications, vital signs for the day and any symptoms you may be experiencing. Please have this information available prior to the time of visit start. It may also be helpful for you to have a pad of paper and pen handy for any instructions given during your visit. They will also walk you through joining the smartphone meeting if this is a video visit. ° ° ° °CONSENT FOR TELE-HEALTH VISIT - PLEASE REVIEW ° °I hereby voluntarily request, consent and authorize [facility] and its employed or contracted physicians, physician  assistants, nurse practitioners or other licensed health care professionals (the Practitioner), to provide me with telemedicine health care services (the “Services") as deemed necessary by the treating Practitioner. I acknowledge and consent to receive the Services by the Practitioner via telemedicine. I understand that the telemedicine visit will involve communicating with the Practitioner through live audiovisual communication technology and the disclosure of certain medical information by electronic transmission. I acknowledge that I have been given the opportunity to request an in-person assessment or other available alternative prior to the telemedicine visit and am voluntarily participating in the telemedicine visit. ° °I understand that I have the right to withhold or withdraw my consent to the use of telemedicine in the course of my care at any time, without affecting my right to future care or treatment, and that the Practitioner or I may terminate the telemedicine visit at any time. I understand that I have the right to inspect all information obtained and/or recorded in the course of the telemedicine visit and may receive copies of available information for a reasonable fee.  I understand that some of the potential risks of receiving the Services via telemedicine include:  °• Delay or interruption in medical evaluation due to technological equipment failure or disruption; °• Information transmitted may not be sufficient (e.g. poor resolution of images) to allow for appropriate medical decision making by the Practitioner; and/or  °• In rare instances, security protocols could fail, causing a breach of personal health information. ° °Furthermore, I acknowledge that it is my responsibility to provide information about my medical history, conditions and care that is complete and accurate to the best of my ability. I acknowledge that Practitioner's advice, recommendations, and/or decision may be based on  factors not within their control, such as incomplete or inaccurate data provided by me or distortions of diagnostic images or specimens that may result from electronic transmissions. I understand that the practice of medicine is not an exact science and that Practitioner makes no warranties or guarantees regarding treatment outcomes. I acknowledge that I will receive a copy of this consent concurrently upon execution via email to the email address I last provided but may also request a printed copy by calling the office of [facility].   ° °I understand that my insurance will be billed for this visit.  ° °I have read or had this consent read to me. °• I understand the contents of this consent, which adequately explains the benefits and risks of the Services being provided via telemedicine.  °• I have been provided ample opportunity to ask questions regarding this consent and the Services and have had my questions answered to my satisfaction. °• I give my informed consent for the services to be provided through the use of telemedicine in my medical care ° °By participating in this telemedicine visit I agree to the above. ° °

## 2019-02-08 DIAGNOSIS — E538 Deficiency of other specified B group vitamins: Secondary | ICD-10-CM | POA: Diagnosis not present

## 2019-02-08 DIAGNOSIS — K219 Gastro-esophageal reflux disease without esophagitis: Secondary | ICD-10-CM | POA: Diagnosis not present

## 2019-02-08 DIAGNOSIS — I5022 Chronic systolic (congestive) heart failure: Secondary | ICD-10-CM | POA: Diagnosis not present

## 2019-02-08 DIAGNOSIS — E039 Hypothyroidism, unspecified: Secondary | ICD-10-CM | POA: Diagnosis not present

## 2019-02-08 DIAGNOSIS — I509 Heart failure, unspecified: Secondary | ICD-10-CM | POA: Diagnosis not present

## 2019-02-08 DIAGNOSIS — I110 Hypertensive heart disease with heart failure: Secondary | ICD-10-CM | POA: Diagnosis not present

## 2019-02-08 DIAGNOSIS — M6281 Muscle weakness (generalized): Secondary | ICD-10-CM | POA: Diagnosis not present

## 2019-02-08 DIAGNOSIS — Z9981 Dependence on supplemental oxygen: Secondary | ICD-10-CM | POA: Diagnosis not present

## 2019-02-08 DIAGNOSIS — Z1159 Encounter for screening for other viral diseases: Secondary | ICD-10-CM | POA: Diagnosis not present

## 2019-02-08 DIAGNOSIS — D6489 Other specified anemias: Secondary | ICD-10-CM | POA: Diagnosis not present

## 2019-02-08 DIAGNOSIS — Z79899 Other long term (current) drug therapy: Secondary | ICD-10-CM | POA: Diagnosis not present

## 2019-02-08 DIAGNOSIS — D46Z Other myelodysplastic syndromes: Secondary | ICD-10-CM | POA: Diagnosis not present

## 2019-02-08 DIAGNOSIS — D638 Anemia in other chronic diseases classified elsewhere: Secondary | ICD-10-CM | POA: Diagnosis not present

## 2019-02-08 DIAGNOSIS — I420 Dilated cardiomyopathy: Secondary | ICD-10-CM | POA: Diagnosis not present

## 2019-02-08 DIAGNOSIS — J188 Other pneumonia, unspecified organism: Secondary | ICD-10-CM | POA: Diagnosis not present

## 2019-02-08 DIAGNOSIS — Z7189 Other specified counseling: Secondary | ICD-10-CM | POA: Diagnosis not present

## 2019-02-08 DIAGNOSIS — I428 Other cardiomyopathies: Secondary | ICD-10-CM | POA: Diagnosis not present

## 2019-02-08 DIAGNOSIS — R5381 Other malaise: Secondary | ICD-10-CM | POA: Diagnosis not present

## 2019-02-08 DIAGNOSIS — Z862 Personal history of diseases of the blood and blood-forming organs and certain disorders involving the immune mechanism: Secondary | ICD-10-CM | POA: Diagnosis not present

## 2019-02-08 DIAGNOSIS — R1312 Dysphagia, oropharyngeal phase: Secondary | ICD-10-CM | POA: Diagnosis not present

## 2019-02-08 DIAGNOSIS — I959 Hypotension, unspecified: Secondary | ICD-10-CM | POA: Diagnosis not present

## 2019-02-08 DIAGNOSIS — R29898 Other symptoms and signs involving the musculoskeletal system: Secondary | ICD-10-CM | POA: Diagnosis not present

## 2019-02-08 DIAGNOSIS — M2550 Pain in unspecified joint: Secondary | ICD-10-CM | POA: Diagnosis not present

## 2019-02-08 DIAGNOSIS — I10 Essential (primary) hypertension: Secondary | ICD-10-CM | POA: Diagnosis not present

## 2019-02-08 DIAGNOSIS — R7889 Finding of other specified substances, not normally found in blood: Secondary | ICD-10-CM | POA: Diagnosis not present

## 2019-02-08 DIAGNOSIS — E1151 Type 2 diabetes mellitus with diabetic peripheral angiopathy without gangrene: Secondary | ICD-10-CM | POA: Diagnosis not present

## 2019-02-08 DIAGNOSIS — Z7401 Bed confinement status: Secondary | ICD-10-CM | POA: Diagnosis not present

## 2019-02-08 DIAGNOSIS — D61818 Other pancytopenia: Secondary | ICD-10-CM | POA: Diagnosis not present

## 2019-02-08 DIAGNOSIS — D469 Myelodysplastic syndrome, unspecified: Secondary | ICD-10-CM | POA: Diagnosis not present

## 2019-02-08 DIAGNOSIS — J9601 Acute respiratory failure with hypoxia: Secondary | ICD-10-CM | POA: Diagnosis not present

## 2019-02-08 DIAGNOSIS — R41841 Cognitive communication deficit: Secondary | ICD-10-CM | POA: Diagnosis not present

## 2019-02-08 DIAGNOSIS — D649 Anemia, unspecified: Secondary | ICD-10-CM | POA: Diagnosis not present

## 2019-02-08 MED ORDER — SACUBITRIL-VALSARTAN 24-26 MG PO TABS: 1.0000 | ORAL_TABLET | Freq: Two times a day (BID) | ORAL | 0 refills | Status: DC

## 2019-02-08 MED ORDER — SPIRONOLACTONE 25 MG PO TABS: 12.5000 mg | ORAL_TABLET | Freq: Every day | ORAL | 0 refills | Status: DC

## 2019-02-08 MED ORDER — FUROSEMIDE 40 MG PO TABS: 40.0000 mg | ORAL_TABLET | Freq: Every day | ORAL | 2 refills | Status: DC

## 2019-02-08 NOTE — Discharge Summary (Signed)
Physician Discharge Summary  [name] RWE:[id number] DOB: 08/21/1946 DOA: 01/22/2019  PCP: Elby Showers, MD  Admit date: 01/22/2019 Discharge date: 02/08/2019  Admitted From: Home. Disposition: Skilled nursing facility.  Recommendations for Outpatient Follow-up:  1. Follow up with PCP in 1-2 weeks 2. Follow-up with cardiology as scheduled.  Home Health: Not applicable Equipment/Devices: Not applicable  Discharge Condition: Fair CODE STATUS: DNR/DNI Diet recommendation: Low-salt diet  Brief/Interim Summary: [name] a 73 year old female who presented with dyspnea and weakness. She does have significant past medical history for myelodysplastic syndrome andnonischemic cardiomyopathy, systolic dysfunction ejection fraction 25 to 30%.She was seen by her primary care provider on the day of admission, she was found hypoxic down to 80% on room air and her hemoglobin was down to 6.1, she was placed on supplemental oxygen and transferred to the hospital for further evaluation. She physical examination blood pressure was 103/35, heart rate 77, respiratory rate 20, temperature 98.4, oxygen saturation 96%,her lungs were showing rhonchi bilaterally, heart S1-S2 present and rhythmic, abdomen was soft, no lower extremity edema. Sodium 132, potassium 3.6, chloride 83, bicarb 31, glucose 122, BUN 22, creatinine 1.0, white count 3.8, hemoglobin 6.1, hematocrit 19.0, platelets 65.Urinalysis negative for infection.Her chest x-ray had bilateral nodular infiltrates. SARS COVID-19 negative. Patient was admitted to hospital working diagnosis of acute hypoxic respiratory failure due to multifocal pneumonia and was started on IV Rocephin and Zithromax. PCCM and cardiology were also consulted due to worsening respiratory distress. She was found to have acute exacerbation of systolic heart failure and started on IV Lasix and milrinone.  Patient was treated with BiPAP.  She is off BiPAP now and  nasal cannula oxygen.  Patient was seen and followed by cardiology service.  Discharge Diagnoses:  Principal Problem:   Acute respiratory failure with hypoxia (HCC) Active Problems:   Hypothyroidism   Essential hypertension   Symptomatic anemia   MDS (myelodysplastic syndrome) (HCC)   Multifocal pneumonia   Respiratory failure (HCC)   Dyspnea   Congestive dilated cardiomyopathy (HCC)   Acute systolic heart failure (HCC)   Acute on chronic systolic CHF (congestive heart failure), NYHA class 4 (HCC)  Acute hypoxic respiratory failure secondary to acute on chronic systolic congestive heart failure and acute lung injury: -- Initially treated as pneumonia with Rocephin and azithromycin.  Pneumonia ruled out.  COVID-19 negative.  Respiratory virus panel was negative.  She is currently off antibiotics. -- Treated for acute exacerbation of chronic systolic heart failure.  Now off milrinone.  She was treated with IV Lasix that is been converted to oral Lasix now.  Followed by heart failure team.  Currently on Entresto, Aldactone and Coreg.  She had tolerated the medications now.   Acute lung injury: Treated with high-dose IV steroids and finished the therapy.  now on nasal cannula oxygen but mostly improved.  Pancytopenia secondary to MDS: Received total 4 units of PRBC during this admission.  Clinically improving.  Hypertension: Blood pressures fluctuating and labile.  On heart failure regimen as above.  Hypothyroidism: Continue Synthroid.  Keep on nasal cannula oxygen to keep saturation more than 90%.  She will slowly wean off the oxygen. Patient does have multiple medical issues but currently she is stable.  She can be transferred to a skilled level of care.    Discharge Instructions  Discharge Instructions    Call MD for:  difficulty breathing, headache or visual disturbances   Complete by:  As directed    Diet - low sodium  heart healthy   Complete by:  As directed     Discharge instructions   Complete by:  As directed    To use all blood pressure and cardiac medicine if SBP >100   Increase activity slowly   Complete by:  As directed      Allergies as of 02/08/2019   No Known Allergies     Medication List    STOP taking these medications   losartan 25 MG tablet Commonly known as:  COZAAR   omeprazole 40 MG capsule Commonly known as:  PRILOSEC     TAKE these medications   albuterol 108 (90 Base) MCG/ACT inhaler Commonly known as:  VENTOLIN HFA Inhale 2 puffs into the lungs every 6 (six) hours as needed for wheezing or shortness of breath.   carvedilol 25 MG tablet Commonly known as:  COREG TAKE 1 TABLET BY MOUTH TWICE A DAY What changed:  when to take this   folic acid 1 MG tablet Commonly known as:  FOLVITE Take 1 tablet (1 mg total) by mouth daily.   furosemide 40 MG tablet Commonly known as:  LASIX Take 1 tablet (40 mg total) by mouth daily. What changed:  how much to take   glucose blood test strip Commonly known as:  Accu-Chek Aviva Check daily.  For Diabetes 250.00   ipratropium-albuterol 0.5-2.5 (3) MG/3ML Soln Commonly known as:  DUONEB Take 3 mLs by nebulization every 4 (four) hours as needed (shortness of breath or wheezing).   Lancets Misc Check glucose daily.  Diabetes 250.00   levothyroxine 150 MCG tablet Commonly known as:  SYNTHROID TAKE 1 TABLET BY MOUTH EVERY DAY What changed:  when to take this   pantoprazole 40 MG tablet Commonly known as:  PROTONIX Take 1 tablet (40 mg total) by mouth daily.   prochlorperazine 10 MG tablet Commonly known as:  COMPAZINE Take 1 tablet (10 mg total) by mouth every 6 (six) hours as needed for nausea or vomiting.   sacubitril-valsartan 24-26 MG Commonly known as:  ENTRESTO Take 1 tablet by mouth 2 (two) times daily.   spironolactone 25 MG tablet Commonly known as:  ALDACTONE Take 0.5 tablets (12.5 mg total) by mouth daily for 30 days.   Systane Balance 0.6 %  Soln Generic drug:  Propylene Glycol Place 1 drop into both eyes daily as needed (for dry eyes).   VITAMIN B-12 PO Take 1 tablet by mouth daily. gummies      Follow-up Information    Daune Perch, NP Follow up on 02/19/2019.   Specialty:  Cardiology Why:  You have been scheduled for a telemedicine visit with Pecolia Ades, NP, 02/19/2019 at [place] will receive a call 2-3 days prior to your visit with additional instructions. Please refer to your discharge papers for more info.  Contact information: [address] [place] [address] [phone]          No Known Allergies  Consultations:  PCCM  Cardiology   Procedures/Studies: Dg Chest 2 View  Result Date: 01/22/2019 CLINICAL DATA:  Shortness of breath and fatigue. History of myelodysplasia EXAM: CHEST - 2 VIEW COMPARISON:  August 07, 2018 FINDINGS: There is patchy airspace consolidation in both upper lobes. There is also more subtle airspace consolidation in the right lower lobe. 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 leads are attached to the right atrium and right ventricle. No adenopathy. No bone lesions. IMPRESSION: Multifocal airspace consolidation. Suspect multifocal pneumonia. Atypical organism pneumonia a  differential consideration. There is pulmonary vascular congestion. No appreciable interstitial edema. Pacemaker leads attached to right atrium and right ventricle. These results will be called to the ordering clinician or representative by the Radiologist Assistant, and communication documented in the PACS or zVision Dashboard. Electronically Signed   By: Lowella Grip III M.D.   On: 01/22/2019 11:42   Ct Angio Chest Pe W Or Wo Contrast  Result Date: 01/23/2019 CLINICAL DATA:  Acute hypoxic respiratory failure. EXAM: CT ANGIOGRAPHY CHEST WITH CONTRAST TECHNIQUE: Multidetector CT imaging of the chest was performed using the standard protocol during bolus administration of  intravenous contrast. Multiplanar CT image reconstructions and MIPs were obtained to evaluate the vascular anatomy. CONTRAST:  174mL OMNIPAQUE IOHEXOL 350 MG/ML SOLN COMPARISON:  Chest x-ray from yesterday. FINDINGS: Cardiovascular: Satisfactory opacification of the pulmonary arteries to the segmental level. No evidence of pulmonary embolism. Enlarged main pulmonary artery measuring up to 4.0 cm. Moderate cardiomegaly. No pericardial effusion. No thoracic aortic aneurysm or dissection. Mild atherosclerotic calcification of the aortic arch. Left chest wall pacemaker. Mediastinum/Nodes: Mildly enlarged AP window lymph node measuring 1.5 cm in short axis. No additional enlarged mediastinal lymph nodes. No enlarged hilar or axillary lymph nodes. The thyroid gland, trachea, and esophagus demonstrate no significant findings. Lungs/Pleura: Moderate diffuse patchy peribronchovascular ground-glass densities and consolidation throughout both lungs. No pneumothorax. Upper Abdomen: No acute abnormality.  Cholelithiasis. Musculoskeletal: No chest wall abnormality. No acute or significant osseous findings. Review of the MIP images confirms the above findings. IMPRESSION: 1. Moderate diffuse patchy peribronchovascular ground-glass densities and consolidation throughout both lungs could reflect pulmonary edema or multifocal pneumonia. 2.  No evidence of pulmonary embolism. 3. Trace bilateral pleural effusions. 4. Moderately enlarged main pulmonary artery, suggestive of pulmonary arterial hypertension. 5. Mildly enlarged AP window lymph node, likely reactive. 6.  Aortic atherosclerosis (ICD10-I70.0). 7. Cholelithiasis. Electronically Signed   By: Titus Dubin M.D.   On: 01/23/2019 15:42   Dg Chest Port 1 View  Result Date: 02/02/2019 CLINICAL DATA:  Respiratory failure. EXAM: PORTABLE CHEST 1 VIEW COMPARISON:  Radiograph of Feb 01, 2019. FINDINGS: Stable cardiomegaly. Left-sided pacemaker is unchanged in position. Right-sided  PICC line is unchanged. No pneumothorax is noted. Stable bilateral lung opacities are noted concerning for edema or possibly pneumonia. Mild left pleural effusion is noted. Bony thorax is unremarkable. IMPRESSION: Stable bilateral lung opacities are noted concerning for edema or possibly pneumonia. Mild left pleural effusion is noted. Electronically Signed   By: Marijo Conception M.D.   On: 02/02/2019 08:18   Dg Chest Port 1 View  Result Date: 02/01/2019 CLINICAL DATA:  Respiratory failure EXAM: PORTABLE CHEST 1 VIEW COMPARISON:  01/31/2019 FINDINGS: Cardiac shadow is enlarged but stable. Defibrillator is again seen as well as a right-sided PICC line. Diffuse interstitial and airspace disease is noted stable from the prior exam consistent with congestive failure. Basilar densities remain and stable. No bony abnormality is seen. IMPRESSION: CHF and bibasilar atelectasis stable from the prior exam. Electronically Signed   By: Inez Catalina M.D.   On: 02/01/2019 07:45   Dg Chest Port 1 View  Result Date: 01/31/2019 CLINICAL DATA:  Hypoxia. EXAM: PORTABLE CHEST 1 VIEW COMPARISON:  One-view chest x-ray 01/27/2019 FINDINGS: The heart is enlarged. Extensive interstitial and airspace opacities are present. Increased densities are noted lung bases bilaterally. Pacing wires are stable. IMPRESSION: 1. Cardiomegaly with diffuse interstitial and airspace disease compatible with congestive heart failure. 2. Bibasilar consolidation. This likely reflects atelectasis. Infection is not excluded.  Electronically Signed   By: San Morelle M.D.   On: 01/31/2019 11:55   Dg Chest Port 1 View  Result Date: 01/27/2019 CLINICAL DATA:  Decreased oxygen EXAM: PORTABLE CHEST 1 VIEW COMPARISON:  01/26/2019 FINDINGS: Cardiac enlargement with AICD. Diffuse bilateral airspace disease with progression. This is symmetric and could represent edema or pneumonia. No significant pleural effusion. IMPRESSION: Worsening of diffuse bilateral  airspace disease which could represent edema or pneumonia. Cardiac enlargement. Electronically Signed   By: Franchot Gallo M.D.   On: 01/27/2019 19:03   Dg Chest Port 1 View  Result Date: 01/26/2019 CLINICAL DATA:  Dyspnea. EXAM: PORTABLE CHEST 1 VIEW COMPARISON:  Chest radiographs 01/22/2019 and CT 01/23/2019 FINDINGS: An ICD remains in place, and the cardiac silhouette remains enlarged. Central pulmonary vascular congestion and peribronchial cuffing are again seen. Patchy bilateral airspace opacities have mildly worsened from the prior radiographs, most notably in the lung bases. No large pleural effusion or pneumothorax is identified. IMPRESSION: Mild worsening of bilateral airspace opacities which may reflect edema or pneumonia. Electronically Signed   By: Logan Bores M.D.   On: 01/26/2019 11:15   [place] Ekg Site Rite  Result Date: 01/28/2019 If Site Rite image not attached, placement could not be confirmed due to current cardiac rhythm.      Subjective: Patient was seen and examined on the day of discharge.  She denied any complaints.  She is on 2 L oxygen at rest and denies any shortness of breath.  No overnight events.  Blood pressures fairly stable more than 90 systolic.   Discharge Exam: Vitals:   02/08/19 0500 02/08/19 0736  BP: (!) 119/41 (!) 107/40  Pulse: 70 73  Resp: 16   Temp: 98 F (36.7 C) 98.1 F (36.7 C)  SpO2: 100% 100%   Vitals:   02/07/19 2224 02/08/19 0030 02/08/19 0500 02/08/19 0736  BP: (!) 96/42 (!) 103/45 (!) 119/41 (!) 107/40  Pulse:  61 70 73  Resp:  16 16   Temp:  97.8 F (36.6 C) 98 F (36.7 C) 98.1 F (36.7 C)  TempSrc:  Oral Oral Oral  SpO2:  100% 100% 100%  Weight:   86.2 kg   Height:        General: Pt is alert, awake, not in acute distress, on 2 L oxygen. Cardiovascular: RRR, S1/S2 +, no rubs, no gallops Respiratory: CTA bilaterally, no wheezing, no rhonchi, no added sounds. Abdominal: Soft, NT, ND, bowel sounds + obese and  pendulous. Extremities: no edema, no cyanosis    The results of significant diagnostics from this hospitalization (including imaging, microbiology, ancillary and laboratory) are listed below for reference.     Microbiology: Recent Results (from the past 240 hour(s))  MRSA PCR Screening     Status: Abnormal   Collection Time: 01/29/19  9:40 AM  Result Value Ref Range Status   MRSA by PCR POSITIVE (A) NEGATIVE Final    Comment:        The GeneXpert MRSA Assay (FDA approved for NASAL specimens only), is one component of a comprehensive MRSA colonization surveillance program. It is not intended to diagnose MRSA infection nor to guide or monitor treatment for MRSA infections. RESULT CALLED TO, READ BACK BY AND VERIFIED WITH: H.MORRIS,RN [id number] @1137  BY V.WILKINS Performed at [facility] [address] [place], [place] [address]      Labs: BNP (last 3 results) Recent Labs    01/24/19 1219 01/28/19 0322 01/28/19 1100  BNP 1,387.4* 1,441.5*  6,468.0*   Basic Metabolic Panel: Recent Labs  Lab 02/03/19 0430 02/04/19 0447 02/05/19 0350 02/06/19 0437 02/07/19 0325  NA 141 140 141 140 140  K 4.4 4.6 4.7 4.5 4.4  CL 91* 92* 94* 93* 94*  CO2 43* 41* 41* 43* 41*  GLUCOSE 170* 174* 209* 105* 122*  BUN 26* 25* 26* 31* 30*  CREATININE 0.62 0.61 0.65 0.73 0.70  CALCIUM 8.7* 9.1 8.5* 8.9 8.5*   Liver Function Tests: No results for input(s): AST, ALT, ALKPHOS, BILITOT, PROT, ALBUMIN in the last 168 hours. No results for input(s): LIPASE, AMYLASE in the last 168 hours. No results for input(s): AMMONIA in the last 168 hours. CBC: Recent Labs  Lab 02/03/19 0430 02/04/19 0447 02/05/19 0350 02/06/19 0437 02/07/19 0325  WBC 6.9 9.3 12.9* 16.3* 14.9*  NEUTROABS 4.1 5.4 8.1* 8.2* 7.9*  HGB 8.6* 8.6* 8.3* 8.9* 7.9*  HCT 28.3* 28.6* 27.9* 28.3* 25.7*  MCV 103.7* 103.2* 103.0* 102.5* 102.8*  PLT 70* 77* 86* 100* 93*   Cardiac Enzymes: No results for  input(s): CKTOTAL, CKMB, CKMBINDEX, TROPONINI in the last 168 hours. BNP: Invalid input(s): POCBNP CBG: No results for input(s): GLUCAP in the last 168 hours. D-Dimer No results for input(s): DDIMER in the last 72 hours. Hgb A1c No results for input(s): HGBA1C in the last 72 hours. Lipid Profile No results for input(s): CHOL, HDL, LDLCALC, TRIG, CHOLHDL, LDLDIRECT in the last 72 hours. Thyroid function studies No results for input(s): TSH, T4TOTAL, T3FREE, THYROIDAB in the last 72 hours.  Invalid input(s): FREET3 Anemia work up No results for input(s): VITAMINB12, FOLATE, FERRITIN, TIBC, IRON, RETICCTPCT in the last 72 hours. Urinalysis    Component Value Date/Time   COLORURINE YELLOW 01/22/2019 1247   APPEARANCEUR CLEAR 01/22/2019 1247   LABSPEC 1.003 (L) 01/22/2019 1247   PHURINE 5.0 01/22/2019 1247   GLUCOSEU NEGATIVE 01/22/2019 1247   HGBUR NEGATIVE 01/22/2019 1247   BILIRUBINUR NEGATIVE 01/22/2019 1247   BILIRUBINUR NEG 05/18/2018 1420   KETONESUR NEGATIVE 01/22/2019 1247   PROTEINUR NEGATIVE 01/22/2019 1247   UROBILINOGEN 0.2 05/18/2018 1420   UROBILINOGEN 2.0 (H) 08/27/2014 1747   NITRITE NEGATIVE 01/22/2019 1247   LEUKOCYTESUR NEGATIVE 01/22/2019 1247   Sepsis Labs Invalid input(s): PROCALCITONIN,  WBC,  LACTICIDVEN Microbiology Recent Results (from the past 240 hour(s))  MRSA PCR Screening     Status: Abnormal   Collection Time: 01/29/19  9:40 AM  Result Value Ref Range Status   MRSA by PCR POSITIVE (A) NEGATIVE Final    Comment:        The GeneXpert MRSA Assay (FDA approved for NASAL specimens only), is one component of a comprehensive MRSA colonization surveillance program. It is not intended to diagnose MRSA infection nor to guide or monitor treatment for MRSA infections. RESULT CALLED TO, READ BACK BY AND VERIFIED WITH: H.MORRIS,RN [id number] @1137  BY V.WILKINS Performed at [facility] [address], [place], [place] [address]       Time coordinating discharge: 32 minutes  SIGNED:   Barb Merino, MD  [facility] 02/08/2019, 9:02 AM Pager [phone]  If 7PM-7AM, please contact night-coverage [email or web address] Password [email or web address]

## 2019-02-08 NOTE — Progress Notes (Signed)
Pt discharged to [facility] SNF today per Dr. Sloan Leiter. Pt's PICC removed, site WDL. Pt's VSS. Report called to nurse at facility. Verbalized understanding. Pt is currently awaiting PTAR arrival.

## 2019-02-08 NOTE — Telephone Encounter (Signed)
**Note De-Identified Kyriana Yankee Obfuscation** The pt was discharged today. Per the TOC progression note from 02/06/2019 the pt is being discharged to a SNF: Clinical Narrative:   Patient has bed at [facility] once medically cleared for discharge   No TCM call from [place] is required.

## 2019-02-08 NOTE — TOC Transition Note (Signed)
Transition of Care [facility]) - CM/SW Discharge Note   Patient Details  Name: [name] MRN: [id number] Date of Birth: Apr 12, 1946  Transition of Care [facility]) CM/SW Contact:  Wende Neighbors, LCSW Phone Number: 02/08/2019, 2:35 PM   Clinical Narrative:   Patient discharging to [facility] via ptar. RN to please call [phone] ([phone]) for report. Patients friend Benjamine Mola is aware of discharge plan    Final next level of care: [place] Barriers to Discharge: No Barriers Identified   Patient Goals and CMS Choice     Choice offered to / list presented to : Patient  Discharge Placement              Patient chooses bed at: ([facility] in [place]) Patient to be transferred to facility by: ptar Name of family member notified: friend elizabeth Patient and family notified of of transfer: 02/08/19  Discharge Plan and Services   Discharge Planning Services: CM Consult                                 Social Determinants of Health ([place]) Interventions     Readmission Risk Interventions Readmission Risk Prevention Plan 01/29/2019  Transportation Screening Complete  PCP or Specialist Appt within 3-5 Days Not Complete  Some recent data might be hidden

## 2019-02-09 ENCOUNTER — Telehealth: Payer: Self-pay | Admitting: Cardiovascular Disease

## 2019-02-09 DIAGNOSIS — D46Z Other myelodysplastic syndromes: Secondary | ICD-10-CM | POA: Diagnosis not present

## 2019-02-09 DIAGNOSIS — D61818 Other pancytopenia: Secondary | ICD-10-CM | POA: Diagnosis not present

## 2019-02-09 DIAGNOSIS — Z7189 Other specified counseling: Secondary | ICD-10-CM | POA: Diagnosis not present

## 2019-02-09 DIAGNOSIS — I428 Other cardiomyopathies: Secondary | ICD-10-CM | POA: Diagnosis not present

## 2019-02-09 DIAGNOSIS — I420 Dilated cardiomyopathy: Secondary | ICD-10-CM | POA: Diagnosis not present

## 2019-02-09 DIAGNOSIS — J188 Other pneumonia, unspecified organism: Secondary | ICD-10-CM | POA: Diagnosis not present

## 2019-02-09 NOTE — Telephone Encounter (Signed)
Dr. Loanne Drilling this patient was D/C to SNF. The order for the Chest CT was placed. I left message prior to hospital D/C with the patient, left message again today. Just FYI.

## 2019-02-09 NOTE — Telephone Encounter (Signed)
New Message:   Nurse calling from [facility] in [place] this patient who is in rehab with them and need to speak with a nurse.

## 2019-02-09 NOTE — Telephone Encounter (Signed)
Patient has been admitted to [facility] from [facility] for rehab s/p lengthy hospitalization for CHF exacerbation and acute respiratory failure. Janalyn Harder, caretaker at the facility called to ask about medication instructions due to patient is hypotensive. She states the discharge instructions differ from the patient's prescription list and she needs clarification for the facility APP. I reviewed patient's chart and advised her of the guidelines given by Dr. Harrell Gave on 5/27 prior to patient's discharge on 5/28. I faxed a copy to [facility] for her review and advised her to call back with questions or concerns. She thanked me for the help.

## 2019-02-11 LAB — ACETYLCHOLINE RECEPTOR AB, ALL
Acety choline binding ab: 0.03 nmol/L (ref 0.00–0.24)
Acetylchol Block Ab: 16 % (ref 0–25)
Acetylcholine Modulat Ab: <12 % (ref 0–20)

## 2019-02-12 DIAGNOSIS — I420 Dilated cardiomyopathy: Secondary | ICD-10-CM | POA: Diagnosis not present

## 2019-02-12 DIAGNOSIS — D61818 Other pancytopenia: Secondary | ICD-10-CM | POA: Diagnosis not present

## 2019-02-14 ENCOUNTER — Telehealth: Payer: Self-pay | Admitting: Cardiovascular Disease

## 2019-02-14 DIAGNOSIS — Z862 Personal history of diseases of the blood and blood-forming organs and certain disorders involving the immune mechanism: Secondary | ICD-10-CM | POA: Diagnosis not present

## 2019-02-14 DIAGNOSIS — D638 Anemia in other chronic diseases classified elsewhere: Secondary | ICD-10-CM | POA: Diagnosis not present

## 2019-02-14 DIAGNOSIS — K219 Gastro-esophageal reflux disease without esophagitis: Secondary | ICD-10-CM | POA: Diagnosis not present

## 2019-02-14 DIAGNOSIS — D469 Myelodysplastic syndrome, unspecified: Secondary | ICD-10-CM | POA: Diagnosis not present

## 2019-02-14 DIAGNOSIS — I10 Essential (primary) hypertension: Secondary | ICD-10-CM | POA: Diagnosis not present

## 2019-02-14 DIAGNOSIS — D649 Anemia, unspecified: Secondary | ICD-10-CM | POA: Diagnosis not present

## 2019-02-14 DIAGNOSIS — I110 Hypertensive heart disease with heart failure: Secondary | ICD-10-CM | POA: Diagnosis not present

## 2019-02-14 DIAGNOSIS — E039 Hypothyroidism, unspecified: Secondary | ICD-10-CM | POA: Diagnosis not present

## 2019-02-14 DIAGNOSIS — I509 Heart failure, unspecified: Secondary | ICD-10-CM | POA: Diagnosis not present

## 2019-02-14 NOTE — Telephone Encounter (Signed)
Called and spoke with Stacy, CT.  CT order changed to HRCT for ILD per [facility].  Nothing further at this time.

## 2019-02-14 NOTE — Telephone Encounter (Signed)
Received call from [facility] that patient is being transported to [facility] due to low hemoglobin. She thanked me for the return call and wanted to make [place] aware that patient will be receiving care outside of the [facility] system. I thanked her for the call.

## 2019-02-14 NOTE — Telephone Encounter (Signed)
I have pt scheduled for CT at [facility] on 7/22 at 10:30, need to arrive at 10:15.  I didn't call pt to give appt info because it looks like per note pt may not have been told about needing CT yet and [place] has left message for pt to call her back.  When I scheduled CT Erline Levine asked me to check to see if CT should be High Res due to dx of Interstitial Lung Disease.  If it does need to be High Res nurse just needs to call & let Erline Levine know.  She can change order.

## 2019-02-14 NOTE — Telephone Encounter (Signed)
Dr Loanne Drilling,  Marzetta Board at CT wanted to make sure you did not want HRCT, since it is for ILD.  Dr Loanne Drilling please advise

## 2019-02-14 NOTE — Telephone Encounter (Signed)
Left detailed message for [name], Surveyor, quantity of nursing at [facility]. I advised that upon our discussion last week the assessment and plan from 5/27 progress note by Dr. Harrell Gave, who saw patient prior to discharge on 5/27, advised that spironolactone be held if systolic BP <863 mmHg.  I advised her to call me back at her convenience.

## 2019-02-14 NOTE — Telephone Encounter (Signed)
New Message       Teneka from [facility] is calling and they want to decrease  Coreg to 12.5mg  2x daily  Having symptoms of dizziness and low BP As low as 72/44 Today 103/48

## 2019-02-14 NOTE — Telephone Encounter (Signed)
I would like HRCT. Thanks  JE

## 2019-02-15 DIAGNOSIS — D469 Myelodysplastic syndrome, unspecified: Secondary | ICD-10-CM | POA: Diagnosis not present

## 2019-02-16 DIAGNOSIS — D469 Myelodysplastic syndrome, unspecified: Secondary | ICD-10-CM | POA: Diagnosis not present

## 2019-02-19 ENCOUNTER — Other Ambulatory Visit: Payer: Self-pay

## 2019-02-19 ENCOUNTER — Ambulatory Visit (INDEPENDENT_AMBULATORY_CARE_PROVIDER_SITE_OTHER): Payer: Medicare Other | Admitting: *Deleted

## 2019-02-19 ENCOUNTER — Encounter: Payer: Medicare Other | Admitting: Cardiology

## 2019-02-19 DIAGNOSIS — I428 Other cardiomyopathies: Secondary | ICD-10-CM

## 2019-02-19 DIAGNOSIS — I420 Dilated cardiomyopathy: Secondary | ICD-10-CM | POA: Diagnosis not present

## 2019-02-19 DIAGNOSIS — I5022 Chronic systolic (congestive) heart failure: Secondary | ICD-10-CM

## 2019-02-19 DIAGNOSIS — D6489 Other specified anemias: Secondary | ICD-10-CM | POA: Diagnosis not present

## 2019-02-19 DIAGNOSIS — D61818 Other pancytopenia: Secondary | ICD-10-CM | POA: Diagnosis not present

## 2019-02-19 DIAGNOSIS — D46Z Other myelodysplastic syndromes: Secondary | ICD-10-CM | POA: Diagnosis not present

## 2019-02-19 LAB — CUP PACEART REMOTE DEVICE CHECK
Battery Remaining Longevity: 36 mo
Battery Remaining Percentage: 46 %
Brady Statistic RA Percent Paced: 0 %
Brady Statistic RV Percent Paced: 0 %
Date Time Interrogation Session: 20200608055000
HighPow Impedance: 44 Ohm
Implantable Lead Implant Date: 20110221
Implantable Lead Implant Date: 20110221
Implantable Lead Location: 753859
Implantable Lead Location: 753860
Implantable Lead Model: 157
Implantable Lead Model: 5076
Implantable Lead Serial Number: 301957
Implantable Pulse Generator Implant Date: 20110221
Lead Channel Impedance Value: 316 Ohm
Lead Channel Impedance Value: 468 Ohm
Lead Channel Pacing Threshold Amplitude: 0.1 V
Lead Channel Pacing Threshold Amplitude: 0.5 V
Lead Channel Pacing Threshold Pulse Width: 0.4 ms
Lead Channel Pacing Threshold Pulse Width: 0.4 ms
Lead Channel Setting Pacing Amplitude: 2 V
Lead Channel Setting Pacing Amplitude: 2.4 V
Lead Channel Setting Pacing Pulse Width: 0.4 ms
Lead Channel Setting Sensing Sensitivity: 0.6 mV
Pulse Gen Serial Number: 160860

## 2019-02-19 NOTE — Progress Notes (Signed)
Date:  02/19/2019   ID:  [name], DOB 1946-08-31, MRN [id number]    PCP:  Elby Showers, MD  Cardiologist:  [name], MD  Electrophysiologist:  Cristopher Peru, MD   Evaluation Performed: Chief Complaint:  Hospital follow up acute CHF  History of Present Illness:    [name] is a 73 y.o. 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  She was admitted to the hospital 01/22/2019- 02/08/2019 with dyspnea and weakness and found to have O2 sat of 80% on room air and hemoglobin down to 6.1. She was treated for hypoxic respiratory failure due to multifocal pneumonia, acute lung injury and acute exacerbation of systolic heart failure. She was treated with IV antibiotics, initially with BiPAP and lasix and milrinone. She required transfusion with 4 units PRBCs. She was started on Entresto and spironolactone during that admission, in addition to home carvedilol. Arlyce Harman was decreased to 12.5 mg. She was discharged to [facility] for rehab on oxygen with plans to wean over time.   Our office received a call 5/27 regarding medications and low BP. Advised to hold spironolactone for BP <100. Another call on 6/3 indicated that pt was taken to [facility] due to low hemoglobin.     ============= - Continue entresto. Prioritizing this as a medication. -Continue spironolactone at lower dose - hold for SBP <100 - Continue carvedilol 25 mg BID. Has occasional NSVT, therefore would like to remain on highest tolerated dose of BB if BP allows. but if BP remains low may need to adjust or transition to metoprolol succinate - Continue home dose furosemide oral dosing - To discuss DNR wishes with Dr. Lovena Le and Dr. Acie Fredrickson at follow-up visit given ICD status.Has discussed CRT with Dr. Lovena Le in the past as well. - has received HF education including sodium and fluid restriction, daily weights,  etc =====================  The patienthave symptoms concerning for COVID-19 infection (fever, chills, cough, or new shortness of breath).    Past Medical History:  Diagnosis Date  . Automatic implantable cardioverter-defibrillator in situ    Dr. Beckie Salts follows-next visit- [phone]  . Cataract   . Diverticulosis   . Dyslipidemia   . Ejection fraction < 50%    EF 25-30%, September, 2010  . GERD (gastroesophageal reflux disease)   . Hypothyroidism   . ICD (implantable cardiac defibrillator) in place    10/2009; Dr. Lovena Le  . Iron deficiency 09/21/2018  . LBBB (left bundle branch block)    old  . Macrocytic anemia   . MDS (myelodysplastic syndrome) ([place]) 12/27/2018  . Mitral regurgitation    Mild, echo, September, 2010  . Nonischemic cardiomyopathy ([place])    Normal coronary arteries, catheterization, September, 2009  . Overweight(278.02)   . Serrated polyp of colon   . Systolic CHF, chronic (HCC)    no recent problems   Past Surgical History:  Procedure Laterality Date  . BIOPSY  08/07/2018   Procedure: BIOPSY;  Surgeon: Jerene Bears, MD;  Location: [facility];  Service: Gastroenterology;;  . CARDIAC CATHETERIZATION     '09 last  . [place]    . COLONOSCOPY N/A 11/29/2013   Procedure: COLONOSCOPY;  Surgeon: Jerene Bears, MD;  Location: [place];  Service: Gastroenterology;  Laterality: N/A;  . COLONOSCOPY N/A 02/25/2015   Procedure: COLONOSCOPY;  Surgeon: Jerene Bears, MD;  Location: [facility];  Service: Gastroenterology;  Laterality:  N/A;  . ESOPHAGOGASTRODUODENOSCOPY (EGD) WITH PROPOFOL N/A 08/07/2018   Procedure: ESOPHAGOGASTRODUODENOSCOPY (EGD) WITH PROPOFOL;  Surgeon: Jerene Bears, MD;  Location: [facility];  Service: Gastroenterology;  Laterality: N/A;  . EYE SURGERY Left    x 3-"droopy eyelid"  . heart catherization    . TONSILLECTOMY  1952     No outpatient medications have been marked as taking for the 02/19/19 encounter  (Appointment) with Daune Perch, NP.     Allergies:   Patient has no known allergies.   Social History   Tobacco Use  . Smoking status: Never Smoker  . Smokeless tobacco: Never Used  Substance Use Topics  . Alcohol use: Yes    Comment: 2-4 beers per week  . Drug use: No     Family Hx: The patient's family history includes Cancer in her father; Heart attack in her mother; Heart disease in her mother; Hypertension in her mother; Lung cancer in her father; Pancreatic cancer in her father; Stroke in her paternal grandfather. There is no history of Colon cancer, Diabetes, Kidney disease, or Liver disease.  ROS:   Please see the history of present illness.     All other systems reviewed and are negative.   Prior CV studies:   The following studies were reviewed today:  Echocardiogram 01/28/2019: 1. The left ventricle has severely reduced systolic function, with an ejection fraction of 20-25%. The cavity size was severely dilated. Left ventricular diastolic Doppler parameters are consistent with pseudonormalization. Elevated left ventricular  end-diastolic pressure Left ventricular diffuse hypokinesis. No evidence of LV thrombus by definity contrast study. 2. The right ventricle has normal systolic function. The cavity was normal. There is no increase in right ventricular wall thickness. Right ventricular systolic pressure is moderately elevated with an estimated pressure of 54.9 mmHg. 3. Left atrial size was moderately dilated. 4. There is mild mitral annular calcification present. Mitral valve regurgitation is mild by color flow Doppler. 5. The aortic valve is tricuspid. There is trivial AR. 6. The inferior vena cava was dilated in size with <50% respiratory variability.  Labs/Other Tests and Data Reviewed:    EKG:    Recent Labs: 11/24/2018: TSH 0.76 01/27/2019: ALT 12 01/28/2019: B Natriuretic Peptide 1,421.8 02/01/2019: Magnesium 2.4 02/07/2019: BUN 30; Creatinine, Ser  0.70; Hemoglobin 7.9; Platelets 93; Potassium 4.4; Sodium 140   Recent Lipid Panel Lab Results  Component Value Date/Time   CHOL 165 05/12/2018 09:13 AM   TRIG 73 05/12/2018 09:13 AM   HDL 48 (L) 05/12/2018 09:13 AM   CHOLHDL 3.4 05/12/2018 09:13 AM   LDLCALC 101 (H) 05/12/2018 09:13 AM    Wt Readings from Last 3 Encounters:  02/08/19 190 lb (86.2 kg)  01/08/19 189 lb 8 oz (86 kg)  12/27/18 191 lb 3.2 oz (86.7 kg)     Objective:    Vital Signs:  There were no vitals taken for this visit.     ASSESSMENT & PLAN:    1. Acute on chronic combined heart failure -Recent hospitalization in setting of acute respiratory failure, PNA, lung injury and severe anemia. Required aggressive diuresis and milrinone. Started on Entresto and spironolactone and continued on prior carvedilol.   --------------- Continue entresto. Prioritizing this as a medication. -Continue spironolactone at lower dose - hold for SBP <100 - Continue carvedilol 25 mg BID. Has occasional NSVT, therefore would like to remain on highest tolerated dose of BB if BP allows - Continue home dose furosemide oral dosing - To discuss DNR wishes with  Dr. Lovena Le and Dr. Acie Fredrickson at follow-up visit given ICD status.Has discussed CRT with Dr. Lovena Le in the past as well. - has received HF education including sodium and fluid restriction, daily weights, etc ---------------------   2. MDS -Pt with severe anemia requiring 4 U PRBCs.  -rehops on 6/3 3. Hypertension 4.     COVID-19 Education: The signs and symptoms of COVID-19 were discussed with the patient and how to seek care for testing (follow up with PCP or arrange E-visit).  The importance of social distancing was discussed today.  Time:   Today, I have spent  minutes with the patient with telehealth technology discussing the above problems.     Medication Adjustments/Labs and Tests Ordered: Current medicines are reviewed at length with the patient today.  Concerns  regarding medicines are outlined above.   Tests Ordered: No orders of the defined types were placed in this encounter.   Medication Changes: No orders of the defined types were placed in this encounter.   Disposition:  Follow up  Signed, Daune Perch, NP  02/19/2019 6:12 AM    [place]  This encounter was created in error - please disregard.

## 2019-02-20 ENCOUNTER — Telehealth: Payer: Self-pay | Admitting: Internal Medicine

## 2019-02-20 DIAGNOSIS — I420 Dilated cardiomyopathy: Secondary | ICD-10-CM | POA: Diagnosis not present

## 2019-02-20 DIAGNOSIS — D46Z Other myelodysplastic syndromes: Secondary | ICD-10-CM | POA: Diagnosis not present

## 2019-02-20 DIAGNOSIS — I428 Other cardiomyopathies: Secondary | ICD-10-CM | POA: Diagnosis not present

## 2019-02-20 DIAGNOSIS — D61818 Other pancytopenia: Secondary | ICD-10-CM | POA: Diagnosis not present

## 2019-02-20 NOTE — Telephone Encounter (Signed)
Scheduled appt per sch msg. Called and spoke with patient. Confirmed date and time °

## 2019-02-22 ENCOUNTER — Other Ambulatory Visit: Payer: Self-pay | Admitting: Internal Medicine

## 2019-02-22 DIAGNOSIS — K219 Gastro-esophageal reflux disease without esophagitis: Secondary | ICD-10-CM | POA: Diagnosis not present

## 2019-02-22 DIAGNOSIS — I110 Hypertensive heart disease with heart failure: Secondary | ICD-10-CM | POA: Diagnosis not present

## 2019-02-22 DIAGNOSIS — D630 Anemia in neoplastic disease: Secondary | ICD-10-CM | POA: Diagnosis not present

## 2019-02-22 DIAGNOSIS — Z9981 Dependence on supplemental oxygen: Secondary | ICD-10-CM | POA: Diagnosis not present

## 2019-02-22 DIAGNOSIS — D469 Myelodysplastic syndrome, unspecified: Secondary | ICD-10-CM | POA: Diagnosis not present

## 2019-02-22 DIAGNOSIS — D61818 Other pancytopenia: Secondary | ICD-10-CM | POA: Diagnosis not present

## 2019-02-22 DIAGNOSIS — E039 Hypothyroidism, unspecified: Secondary | ICD-10-CM | POA: Diagnosis not present

## 2019-02-22 DIAGNOSIS — S27309D Unspecified injury of lung, unspecified, subsequent encounter: Secondary | ICD-10-CM | POA: Diagnosis not present

## 2019-02-22 DIAGNOSIS — I428 Other cardiomyopathies: Secondary | ICD-10-CM | POA: Diagnosis not present

## 2019-02-22 DIAGNOSIS — I700 Atherosclerosis of aorta: Secondary | ICD-10-CM | POA: Diagnosis not present

## 2019-02-22 DIAGNOSIS — J9811 Atelectasis: Secondary | ICD-10-CM | POA: Diagnosis not present

## 2019-02-22 DIAGNOSIS — I5023 Acute on chronic systolic (congestive) heart failure: Secondary | ICD-10-CM | POA: Diagnosis not present

## 2019-02-22 DIAGNOSIS — Z1159 Encounter for screening for other viral diseases: Secondary | ICD-10-CM | POA: Diagnosis not present

## 2019-02-22 DIAGNOSIS — Z6834 Body mass index (BMI) 34.0-34.9, adult: Secondary | ICD-10-CM | POA: Diagnosis not present

## 2019-02-22 DIAGNOSIS — I959 Hypotension, unspecified: Secondary | ICD-10-CM | POA: Diagnosis not present

## 2019-02-22 DIAGNOSIS — J9601 Acute respiratory failure with hypoxia: Secondary | ICD-10-CM | POA: Diagnosis not present

## 2019-02-22 DIAGNOSIS — Z950 Presence of cardiac pacemaker: Secondary | ICD-10-CM | POA: Diagnosis not present

## 2019-02-22 DIAGNOSIS — I272 Pulmonary hypertension, unspecified: Secondary | ICD-10-CM | POA: Diagnosis not present

## 2019-02-22 DIAGNOSIS — I420 Dilated cardiomyopathy: Secondary | ICD-10-CM | POA: Diagnosis not present

## 2019-02-22 DIAGNOSIS — J15212 Pneumonia due to Methicillin resistant Staphylococcus aureus: Secondary | ICD-10-CM | POA: Diagnosis not present

## 2019-02-22 DIAGNOSIS — E669 Obesity, unspecified: Secondary | ICD-10-CM | POA: Diagnosis not present

## 2019-02-22 DIAGNOSIS — K8020 Calculus of gallbladder without cholecystitis without obstruction: Secondary | ICD-10-CM | POA: Diagnosis not present

## 2019-02-23 DIAGNOSIS — J1289 Other viral pneumonia: Secondary | ICD-10-CM | POA: Diagnosis not present

## 2019-02-23 DIAGNOSIS — D649 Anemia, unspecified: Secondary | ICD-10-CM | POA: Diagnosis not present

## 2019-02-23 DIAGNOSIS — I5023 Acute on chronic systolic (congestive) heart failure: Secondary | ICD-10-CM | POA: Diagnosis not present

## 2019-02-23 DIAGNOSIS — I256 Silent myocardial ischemia: Secondary | ICD-10-CM | POA: Diagnosis not present

## 2019-02-23 DIAGNOSIS — D469 Myelodysplastic syndrome, unspecified: Secondary | ICD-10-CM | POA: Diagnosis not present

## 2019-02-23 DIAGNOSIS — D630 Anemia in neoplastic disease: Secondary | ICD-10-CM | POA: Diagnosis not present

## 2019-02-23 DIAGNOSIS — J15212 Pneumonia due to Methicillin resistant Staphylococcus aureus: Secondary | ICD-10-CM | POA: Diagnosis not present

## 2019-02-23 DIAGNOSIS — J9601 Acute respiratory failure with hypoxia: Secondary | ICD-10-CM | POA: Diagnosis not present

## 2019-02-23 DIAGNOSIS — I110 Hypertensive heart disease with heart failure: Secondary | ICD-10-CM | POA: Diagnosis not present

## 2019-02-24 ENCOUNTER — Other Ambulatory Visit: Payer: Self-pay | Admitting: Cardiovascular Disease

## 2019-02-24 ENCOUNTER — Other Ambulatory Visit: Payer: Self-pay | Admitting: Internal Medicine

## 2019-02-27 ENCOUNTER — Telehealth (INDEPENDENT_AMBULATORY_CARE_PROVIDER_SITE_OTHER): Payer: Medicare Other | Admitting: Cardiology

## 2019-02-27 ENCOUNTER — Encounter: Payer: Self-pay | Admitting: Cardiology

## 2019-02-27 ENCOUNTER — Other Ambulatory Visit: Payer: Self-pay

## 2019-02-27 VITALS — BP 111/67 | HR 82 | Ht 60.0 in | Wt 178.6 lb

## 2019-02-27 DIAGNOSIS — I5022 Chronic systolic (congestive) heart failure: Secondary | ICD-10-CM

## 2019-02-27 DIAGNOSIS — I10 Essential (primary) hypertension: Secondary | ICD-10-CM | POA: Diagnosis not present

## 2019-02-27 DIAGNOSIS — D469 Myelodysplastic syndrome, unspecified: Secondary | ICD-10-CM

## 2019-02-27 DIAGNOSIS — J15212 Pneumonia due to Methicillin resistant Staphylococcus aureus: Secondary | ICD-10-CM | POA: Diagnosis not present

## 2019-02-27 DIAGNOSIS — J9601 Acute respiratory failure with hypoxia: Secondary | ICD-10-CM | POA: Diagnosis not present

## 2019-02-27 DIAGNOSIS — D630 Anemia in neoplastic disease: Secondary | ICD-10-CM | POA: Diagnosis not present

## 2019-02-27 DIAGNOSIS — I110 Hypertensive heart disease with heart failure: Secondary | ICD-10-CM | POA: Diagnosis not present

## 2019-02-27 DIAGNOSIS — I5023 Acute on chronic systolic (congestive) heart failure: Secondary | ICD-10-CM | POA: Diagnosis not present

## 2019-02-27 MED ORDER — SACUBITRIL-VALSARTAN 24-26 MG PO TABS: 1.0000 | ORAL_TABLET | Freq: Two times a day (BID) | ORAL | 2 refills | Status: DC

## 2019-02-27 MED ORDER — SPIRONOLACTONE 25 MG PO TABS: 12.5000 mg | ORAL_TABLET | Freq: Every day | ORAL | 3 refills | Status: DC

## 2019-02-27 MED ORDER — CARVEDILOL 25 MG PO TABS: 25.0000 mg | ORAL_TABLET | Freq: Two times a day (BID) | ORAL | 3 refills | Status: DC

## 2019-02-27 NOTE — Progress Notes (Signed)
Remote ICD transmission.   

## 2019-02-27 NOTE — Patient Instructions (Addendum)
Medication Instructions:  Your physician recommends that you continue on your current medications as directed. Please refer to the Current Medication list given to you today.  If you need a refill on your cardiac medications before your next appointment, please call your pharmacy.   Lab work: None  If you have labs (blood work) drawn today and your tests are completely normal, you will receive your results only by: Marland Kitchen MyChart Message (if you have MyChart) OR . A paper copy in the mail If you have any lab test that is abnormal or we need to change your treatment, we will call you to review the results.  Testing/Procedures: None  Follow-Up: You are scheduled for a virtual visit with Dr. Acie Fredrickson on 03/26/2019 @ 11:20 AM  Any Other Special Instructions Will Be Listed Below (If Applicable).    Do the following things EVERY DAY:   1. Weigh yourself EVERY morning after you go to the bathroom but before you eat or drink anything. Write this number down in a weight log/diary. If you gain 3 pounds overnight or 5 pounds in a week, call the office.   2. Take your medicines as prescribed. If you have concerns about your medications, please call us before you stop taking them.    3. Eat low salt foods-Limit salt (sodium) to 2000 mg per day. This will help prevent your body from holding onto fluid. Read food labels as many processed foods have a lot of sodium, especially canned goods and prepackaged meats. If you would like some assistance choosing low sodium foods, we would be happy to set you up with a nutritionist.   4. Stay as active as you can everyday. Staying active will give you more energy and make your muscles stronger. Start with 5 minutes at a time and work your way up to 15-30 minutes a day if you can. Break up your activities--do some in the morning and some in the afternoon. Start with 3 days per week and work your way up to 5 days as you can.  If you have chest pain, feel short of breath,  dizzy, or lightheaded, STOP. If you don't feel better after a short rest, call 911. If you do feel better, call the office to let us know you have symptoms with exercise.   5. Limit all fluids for the day to less than 2 liters. Fluid includes all drinks, coffee, juice, ice chips, soup, jello, and all other liquids.         Pecolia Ades, NP

## 2019-02-27 NOTE — Progress Notes (Signed)
Virtual Visit via Video Note   This visit type was conducted due to national recommendations for restrictions regarding the COVID-19 Pandemic (e.g. social distancing) in an effort to limit this patient's exposure and mitigate transmission in our community.  Due to her co-morbid illnesses, this patient is at least at moderate risk for complications without adequate follow up.  This format is felt to be most appropriate for this patient at this time.  All issues noted in this document were discussed and addressed.  A limited physical exam was performed with this format.  Please refer to the patient's chart for her consent to telehealth for [facility].   Date:  02/27/2019   ID:  [name], DOB May 19, 1946, MRN [id number]  Patient Location: Home Provider Location: Office  PCP:  Elby Showers, MD  Cardiologist:  [name], MD  Electrophysiologist:  Cristopher Peru, MD   Evaluation Performed:  Follow-Up Visit  Chief Complaint:  Hospital follow up CHF  History of Present Illness:    [name] is a 73 y.o. 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  She was admitted to the hospital 01/22/2019- 02/08/2019 with dyspnea and weakness and found to have O2 sat of 80% on room air and hemoglobin down to 6.1. She was treated for hypoxic respiratory failure due to multifocal pneumonia, acute lung injury and acute exacerbation of systolic heart failure. She was treated with IV antibiotics, initially with BiPAP and lasix and milrinone. She required transfusion with 4 units PRBCs. She was started on Entresto and spironolactone during that admission, in addition to home carvedilol. Arlyce Harman was decreased to 12.5 mg. She was discharged to [facility]  for rehab on oxygen with plans to wean over time.   Our office received a call 5/27 regarding medications and low BP. Advised to hold spironolactone for BP <100. Another call on 6/3 indicated that pt was taken to [facility] due to low hemoglobin.   She went back to hospital at [facility] and spent 2 nights for blood transfusion. She went home on June 3rd. She has a follow up with GP on Friday.   Today she is feeling well. She is taking coreg, Entresto, spironolactone and lasix. She is feeling better on the new meds than she did prior to hosp. She is using oxygen around the clock since her hospitalization.    She has no LE edema, orthopnea, or PND. No chest pain/pressure. No lightheadedness or palpitations.   In the last week BP running 104/50's, 111/67, mostly low 100's/50's-60's.  Wt has been stable at home. 177>178.   She is limiting her sodium intake. She had gotten off track with that prior to her hospitalized.   The patient does not have symptoms concerning for COVID-19 infection (fever, chills, cough, or new shortness of breath).    Past Medical History:  Diagnosis Date  . Automatic implantable cardioverter-defibrillator in situ    Dr. Beckie Salts follows-next visit- [phone]  . Cataract   . Diverticulosis   . Dyslipidemia   . Ejection fraction < 50%    EF 25-30%, September, 2010  . GERD (gastroesophageal reflux disease)   . Hypothyroidism   . ICD (implantable cardiac defibrillator) in place    10/2009; Dr. Lovena Le  .  Iron deficiency 09/21/2018  . LBBB (left bundle branch block)    old  . Macrocytic anemia   . MDS (myelodysplastic syndrome) ([place]) 12/27/2018  . Mitral regurgitation    Mild, echo, September, 2010  . Nonischemic cardiomyopathy ([place])    Normal coronary arteries, catheterization, September, 2009  . Overweight(278.02)   . Serrated polyp of colon   . Systolic CHF, chronic (HCC)    no recent problems   Past Surgical History:  Procedure Laterality Date  . BIOPSY   08/07/2018   Procedure: BIOPSY;  Surgeon: Jerene Bears, MD;  Location: [facility];  Service: Gastroenterology;;  . CARDIAC CATHETERIZATION     '09 last  . [place]    . COLONOSCOPY N/A 11/29/2013   Procedure: COLONOSCOPY;  Surgeon: Jerene Bears, MD;  Location: [place];  Service: Gastroenterology;  Laterality: N/A;  . COLONOSCOPY N/A 02/25/2015   Procedure: COLONOSCOPY;  Surgeon: Jerene Bears, MD;  Location: [facility];  Service: Gastroenterology;  Laterality: N/A;  . ESOPHAGOGASTRODUODENOSCOPY (EGD) WITH PROPOFOL N/A 08/07/2018   Procedure: ESOPHAGOGASTRODUODENOSCOPY (EGD) WITH PROPOFOL;  Surgeon: Jerene Bears, MD;  Location: [facility];  Service: Gastroenterology;  Laterality: N/A;  . EYE SURGERY Left    x 3-"droopy eyelid"  . heart catherization    . TONSILLECTOMY  1952     Current Meds  Medication Sig  . albuterol (PROVENTIL HFA;VENTOLIN HFA) 108 (90 Base) MCG/ACT inhaler Inhale 2 puffs into the lungs every 6 (six) hours as needed for wheezing or shortness of breath.  . carvedilol (COREG) 25 MG tablet Take 1 tablet (25 mg total) by mouth 2 (two) times daily.  . Cyanocobalamin (VITAMIN B-12 PO) Take 1 tablet by mouth daily. gummies  . folic acid (FOLVITE) 1 MG tablet TAKE 1 TABLET BY MOUTH EVERY DAY  . furosemide (LASIX) 40 MG tablet Take 1 tablet (40 mg total) by mouth daily.  Marland Kitchen glucose blood (ACCU-CHEK AVIVA) test strip Check daily.  For Diabetes 250.00  . ipratropium-albuterol (DUONEB) 0.5-2.5 (3) MG/3ML SOLN Take 3 mLs by nebulization every 4 (four) hours as needed (shortness of breath or wheezing).  . Lancets MISC Check glucose daily.  Diabetes 250.00  . levothyroxine (SYNTHROID) 150 MCG tablet Take 1 tablet (150 mcg total) by mouth daily before breakfast.  . pantoprazole (PROTONIX) 40 MG tablet Take 1 tablet (40 mg total) by mouth daily.  . prochlorperazine (COMPAZINE) 10 MG tablet Take 1 tablet (10 mg total) by mouth every 6 (six) hours as  needed for nausea or vomiting.  Marland Kitchen Propylene Glycol (SYSTANE BALANCE) 0.6 % SOLN Place 1 drop into both eyes daily as needed (for dry eyes).  . sacubitril-valsartan (ENTRESTO) 24-26 MG Take 1 tablet by mouth 2 (two) times daily.  Marland Kitchen spironolactone (ALDACTONE) 25 MG tablet Take 0.5 tablets (12.5 mg total) by mouth daily.  . [DISCONTINUED] carvedilol (COREG) 25 MG tablet TAKE 1 TABLET BY MOUTH TWICE A DAY  . [DISCONTINUED] sacubitril-valsartan (ENTRESTO) 24-26 MG Take 1 tablet by mouth 2 (two) times daily.  . [DISCONTINUED] spironolactone (ALDACTONE) 25 MG tablet Take 0.5 tablets (12.5 mg total) by mouth daily for 30 days.     Allergies:   Patient has no known allergies.   Social History   Tobacco Use  . Smoking status: Never Smoker  . Smokeless tobacco: Never Used  Substance Use Topics  . Alcohol use: Yes    Comment: 2-4 beers per week  . Drug use: No     Family Hx: The  patient's family history includes Cancer in her father; Heart attack in her mother; Heart disease in her mother; Hypertension in her mother; Lung cancer in her father; Pancreatic cancer in her father; Stroke in her paternal grandfather. There is no history of Colon cancer, Diabetes, Kidney disease, or Liver disease.  ROS:   Please see the history of present illness.     All other systems reviewed and are negative.   Prior CV studies:   The following studies were reviewed today:  Echocardiogram 01/28/2019: 1. The left ventricle has severely reduced systolic function, with an ejection fraction of 20-25%. The cavity size was severely dilated. Left ventricular diastolic Doppler parameters are consistent with pseudonormalization. Elevated left ventricular  end-diastolic pressure Left ventricular diffuse hypokinesis. No evidence of LV thrombus by definity contrast study. 2. The right ventricle has normal systolic function. The cavity was normal. There is no increase in right ventricular wall thickness. Right ventricular  systolic pressure is moderately elevated with an estimated pressure of 54.9 mmHg. 3. Left atrial size was moderately dilated. 4. There is mild mitral annular calcification present. Mitral valve regurgitation is mild by color flow Doppler. 5. The aortic valve is tricuspid. There is trivial AR. 6. The inferior vena cava was dilated in size with <50% respiratory variability.   Labs/Other Tests and Data Reviewed:    EKG:  No ECG reviewed.  Recent Labs: 11/24/2018: TSH 0.76 01/27/2019: ALT 12 01/28/2019: B Natriuretic Peptide 1,421.8 02/01/2019: Magnesium 2.4 02/07/2019: BUN 30; Creatinine, Ser 0.70; Hemoglobin 7.9; Platelets 93; Potassium 4.4; Sodium 140   Recent Lipid Panel Lab Results  Component Value Date/Time   CHOL 165 05/12/2018 09:13 AM   TRIG 73 05/12/2018 09:13 AM   HDL 48 (L) 05/12/2018 09:13 AM   CHOLHDL 3.4 05/12/2018 09:13 AM   LDLCALC 101 (H) 05/12/2018 09:13 AM    Wt Readings from Last 3 Encounters:  02/27/19 178 lb 9.6 oz (81 kg)  02/08/19 190 lb (86.2 kg)  01/08/19 189 lb 8 oz (86 kg)     Objective:    Vital Signs:  BP 111/67   Pulse 82   Ht 5' (1.524 m)   Wt 178 lb 9.6 oz (81 kg)   BMI 34.88 kg/m    VITAL SIGNS:  reviewed GEN:  no acute distress EYES:  sclerae anicteric, EOMI - Extraocular Movements Intact RESPIRATORY:  normal respiratory effort, symmetric expansion NEURO:  alert and oriented x 3, no obvious focal deficit PSYCH:  normal affect  ASSESSMENT & PLAN:    1. Acute on chronic combined CHF:  Hospitalized 01/22/19-02/08/19 with acute hypoxic respiratory failure felt to be multifactorial 2/2 severe anemia, acute lung injury, and CHF. She was hypoxic requiring intermittent BiPAP and NRB. Also requiring milrinone. Initially diuresed with aggressive IV lasix. She was started on entresto and spironolactone, in addition to home carvedilol. Given soft BP's, her spironolactone was decreased to 12.5mg  daily, hold for BP<100 (reviewed with pt).  -  Continue carvedilol 25 mg BID. Had occasional NSVT in hosp, therefore would like to remain on highest tolerated dose of BB if BP allows - Continue current furosemide. -She is tolerating the HF meds well with SBP in the low 100's. Will continue current meds. I do not think she currently has enough room in BP to increase Entresto. She is having no HF symptoms.  -Will keep close follow up with Dr. Acie Fredrickson in 1 month.  -Pt has gotten better with low sodium diet.  -Pt was only given 30 days of  meds from hosp, will provide refills.   Per hospital note: - To discuss DNR wishes with Dr. Lovena Le and Dr. Acie Fredrickson at follow-up visit given ICD status.Has discussed CRT with Dr. Lovena Le in the past as well.  2. MDS: -Required transfusion at [facility] and later again at [facility] - Continue management per primary team. She has appt with PCP tomorrow.  3. HTN: -BP continues to be on the soft side  - Continue to prioritize entresto. Would only hold if SBP<90. Instructed pt. -Continue spironolactone at lower dose - hold for SBP <100. Instructed pt. - trying to maximize BB dose -Pt will let us know if her BP is consistently lower than 90.   COVID-19 Education: The signs and symptoms of COVID-19 were discussed with the patient and how to seek care for testing (follow up with PCP or arrange E-visit).  The importance of social distancing was discussed today.  Time:   Today, I have spent 19 minutes with the patient with telehealth technology discussing the above problems.     Medication Adjustments/Labs and Tests Ordered: Current medicines are reviewed at length with the patient today.  Concerns regarding medicines are outlined above.   Tests Ordered: No orders of the defined types were placed in this encounter.   Medication Changes: Meds ordered this encounter  Medications  . spironolactone (ALDACTONE) 25 MG tablet    Sig: Take 0.5 tablets (12.5 mg total) by mouth daily.    Dispense:  45 tablet    Refill:  3   . sacubitril-valsartan (ENTRESTO) 24-26 MG    Sig: Take 1 tablet by mouth 2 (two) times daily.    Dispense:  60 tablet    Refill:  2  . carvedilol (COREG) 25 MG tablet    Sig: Take 1 tablet (25 mg total) by mouth 2 (two) times daily.    Dispense:  180 tablet    Refill:  3    Follow Up:  Virtual Visit in 1 month(s)  Signed, Daune Perch, NP  02/28/2019 6:06 PM    [facility]

## 2019-02-28 DIAGNOSIS — D469 Myelodysplastic syndrome, unspecified: Secondary | ICD-10-CM | POA: Diagnosis not present

## 2019-02-28 DIAGNOSIS — D630 Anemia in neoplastic disease: Secondary | ICD-10-CM | POA: Diagnosis not present

## 2019-02-28 DIAGNOSIS — I110 Hypertensive heart disease with heart failure: Secondary | ICD-10-CM | POA: Diagnosis not present

## 2019-02-28 DIAGNOSIS — I5023 Acute on chronic systolic (congestive) heart failure: Secondary | ICD-10-CM | POA: Diagnosis not present

## 2019-02-28 DIAGNOSIS — J9601 Acute respiratory failure with hypoxia: Secondary | ICD-10-CM | POA: Diagnosis not present

## 2019-02-28 DIAGNOSIS — J15212 Pneumonia due to Methicillin resistant Staphylococcus aureus: Secondary | ICD-10-CM | POA: Diagnosis not present

## 2019-03-01 DIAGNOSIS — J15212 Pneumonia due to Methicillin resistant Staphylococcus aureus: Secondary | ICD-10-CM | POA: Diagnosis not present

## 2019-03-01 DIAGNOSIS — D469 Myelodysplastic syndrome, unspecified: Secondary | ICD-10-CM | POA: Diagnosis not present

## 2019-03-01 DIAGNOSIS — I110 Hypertensive heart disease with heart failure: Secondary | ICD-10-CM | POA: Diagnosis not present

## 2019-03-01 DIAGNOSIS — I5023 Acute on chronic systolic (congestive) heart failure: Secondary | ICD-10-CM | POA: Diagnosis not present

## 2019-03-01 DIAGNOSIS — D630 Anemia in neoplastic disease: Secondary | ICD-10-CM | POA: Diagnosis not present

## 2019-03-01 DIAGNOSIS — J9601 Acute respiratory failure with hypoxia: Secondary | ICD-10-CM | POA: Diagnosis not present

## 2019-03-02 ENCOUNTER — Ambulatory Visit (INDEPENDENT_AMBULATORY_CARE_PROVIDER_SITE_OTHER): Payer: Medicare Other | Admitting: Internal Medicine

## 2019-03-02 ENCOUNTER — Other Ambulatory Visit: Payer: Self-pay

## 2019-03-02 VITALS — BP 120/70

## 2019-03-02 DIAGNOSIS — Z9981 Dependence on supplemental oxygen: Secondary | ICD-10-CM

## 2019-03-02 DIAGNOSIS — I10 Essential (primary) hypertension: Secondary | ICD-10-CM

## 2019-03-02 DIAGNOSIS — I428 Other cardiomyopathies: Secondary | ICD-10-CM

## 2019-03-02 DIAGNOSIS — J9611 Chronic respiratory failure with hypoxia: Secondary | ICD-10-CM | POA: Diagnosis not present

## 2019-03-02 DIAGNOSIS — E039 Hypothyroidism, unspecified: Secondary | ICD-10-CM

## 2019-03-02 DIAGNOSIS — R0902 Hypoxemia: Secondary | ICD-10-CM

## 2019-03-02 DIAGNOSIS — D469 Myelodysplastic syndrome, unspecified: Secondary | ICD-10-CM

## 2019-03-02 DIAGNOSIS — R5383 Other fatigue: Secondary | ICD-10-CM

## 2019-03-02 LAB — CBC WITH DIFFERENTIAL/PLATELET
Absolute Monocytes: 289 cells/uL (ref 200–950)
Basophils Absolute: 10 cells/uL (ref 0–200)
Basophils Relative: 0.2 %
Eosinophils Absolute: 0 cells/uL — ABNORMAL LOW (ref 15–500)
Eosinophils Relative: 0 %
HCT: 26.6 % — ABNORMAL LOW (ref 35.0–45.0)
Hemoglobin: 8.7 g/dL — ABNORMAL LOW (ref 11.7–15.5)
Lymphs Abs: 1303 cells/uL (ref 850–3900)
MCH: 31.1 pg (ref 27.0–33.0)
MCHC: 32.7 g/dL (ref 32.0–36.0)
MCV: 95 fL (ref 80.0–100.0)
MPV: 10.2 fL (ref 7.5–12.5)
Monocytes Relative: 5.9 %
Neutro Abs: 3298 cells/uL (ref 1500–7800)
Neutrophils Relative %: 67.3 %
Platelets: 133 10*3/uL — ABNORMAL LOW (ref 140–400)
RBC: 2.8 10*6/uL — ABNORMAL LOW (ref 3.80–5.10)
RDW: 16.8 % — ABNORMAL HIGH (ref 11.0–15.0)
Total Lymphocyte: 26.6 %
WBC: 4.9 10*3/uL (ref 3.8–10.8)

## 2019-03-02 NOTE — Progress Notes (Deleted)
Acute Office Visit  Subjective:    Patient ID: [name], female    DOB: May 15, 1946, 73 y.o.   MRN: [id number]  Chief Complaint  Patient presents with  . Hospitalization Follow-up    HPI Patient is in today for hospitalization and subsequent skilled rehab facility admission follow-up.  Patient is now at home utilizing home health services.  Her caseworker from [place] is with her today.  Past Medical History:  Diagnosis Date  . Automatic implantable cardioverter-defibrillator in situ    Dr. Beckie Salts follows-next visit- [phone]  . Cataract   . Diverticulosis   . Dyslipidemia   . Ejection fraction < 50%    EF 25-30%, September, 2010  . GERD (gastroesophageal reflux disease)   . Hypothyroidism   . ICD (implantable cardiac defibrillator) in place    10/2009; Dr. Lovena Le  . Iron deficiency 09/21/2018  . LBBB (left bundle branch block)    old  . Macrocytic anemia   . MDS (myelodysplastic syndrome) ([place]) 12/27/2018  . Mitral regurgitation    Mild, echo, September, 2010  . Nonischemic cardiomyopathy ([place])    Normal coronary arteries, catheterization, September, 2009  . Overweight(278.02)   . Serrated polyp of colon   . Systolic CHF, chronic (HCC)    no recent problems    Past Surgical History:  Procedure Laterality Date  . BIOPSY  08/07/2018   Procedure: BIOPSY;  Surgeon: Jerene Bears, MD;  Location: [facility];  Service: Gastroenterology;;  . CARDIAC CATHETERIZATION     '09 last  . [place]    . COLONOSCOPY N/A 11/29/2013   Procedure: COLONOSCOPY;  Surgeon: Jerene Bears, MD;  Location: [facility];  Service: Gastroenterology;  Laterality: N/A;  . COLONOSCOPY N/A 02/25/2015   Procedure: COLONOSCOPY;  Surgeon: Jerene Bears, MD;  Location: [facility];  Service: Gastroenterology;  Laterality: N/A;  . ESOPHAGOGASTRODUODENOSCOPY (EGD) WITH PROPOFOL N/A 08/07/2018   Procedure: ESOPHAGOGASTRODUODENOSCOPY (EGD) WITH PROPOFOL;  Surgeon: Jerene Bears, MD;  Location: [facility];  Service: Gastroenterology;  Laterality: N/A;  . EYE SURGERY Left    x 3-"droopy eyelid"  . heart catherization    . TONSILLECTOMY  1952    Family History  Problem Relation Age of Onset  . Heart attack Mother   . Heart disease Mother   . Hypertension Mother   . Lung cancer Father   . Pancreatic cancer Father   . Cancer Father   . Stroke Paternal Grandfather   . Colon cancer Neg Hx   . Diabetes Neg Hx   . Kidney disease Neg Hx   . Liver disease Neg Hx     Social History   Socioeconomic History  . Marital status: Single    Spouse name: Not on file  . Number of children: Not on file  . Years of education: Not on file  . Highest education level: Not on file  Occupational History  . Not on file  Social Needs  . Financial resource strain: Not on file  . Food insecurity    Worry: Not on file    Inability: Not on file  . Transportation needs    Medical: Not on file    Non-medical: Not on file  Tobacco Use  . Smoking status: Never Smoker  . Smokeless tobacco: Never Used  Substance and Sexual Activity  . Alcohol use: Yes    Comment: 2-4 beers per week  . Drug use: No  . Sexual activity: Never  Birth control/protection: Abstinence  Lifestyle  . Physical activity    Days per week: Not on file    Minutes per session: Not on file  . Stress: Not on file  Relationships  . Social Herbalist on phone: Not on file    Gets together: Not on file    Attends religious service: Not on file    Active member of club or organization: Not on file    Attends meetings of clubs or organizations: Not on file    Relationship status: Not on file  . Intimate partner violence    Fear of current or ex partner: Not on file    Emotionally abused: Not on file    Physically abused: Not on file    Forced sexual activity: Not on file  Other Topics Concern  . Not on file  Social History Narrative  . Not on file    Outpatient Medications  Prior to Visit  Medication Sig Dispense Refill  . albuterol (PROVENTIL HFA;VENTOLIN HFA) 108 (90 Base) MCG/ACT inhaler Inhale 2 puffs into the lungs every 6 (six) hours as needed for wheezing or shortness of breath. 1 Inhaler prn  . carvedilol (COREG) 25 MG tablet Take 1 tablet (25 mg total) by mouth 2 (two) times daily. 180 tablet 3  . Cyanocobalamin (VITAMIN B-12 PO) Take 1 tablet by mouth daily. gummies    . folic acid (FOLVITE) 1 MG tablet TAKE 1 TABLET BY MOUTH EVERY DAY 90 tablet 0  . furosemide (LASIX) 40 MG tablet Take 1 tablet (40 mg total) by mouth daily. 90 tablet 2  . glucose blood (ACCU-CHEK AVIVA) test strip Check daily.  For Diabetes 250.00 100 each 12  . ipratropium-albuterol (DUONEB) 0.5-2.5 (3) MG/3ML SOLN Take 3 mLs by nebulization every 4 (four) hours as needed (shortness of breath or wheezing).    . Lancets MISC Check glucose daily.  Diabetes 250.00 100 each 11  . levothyroxine (SYNTHROID) 150 MCG tablet Take 1 tablet (150 mcg total) by mouth daily before breakfast. 90 tablet 0  . pantoprazole (PROTONIX) 40 MG tablet Take 1 tablet (40 mg total) by mouth daily. 90 tablet 3  . prochlorperazine (COMPAZINE) 10 MG tablet Take 1 tablet (10 mg total) by mouth every 6 (six) hours as needed for nausea or vomiting. 30 tablet 1  . Propylene Glycol (SYSTANE BALANCE) 0.6 % SOLN Place 1 drop into both eyes daily as needed (for dry eyes).    . sacubitril-valsartan (ENTRESTO) 24-26 MG Take 1 tablet by mouth 2 (two) times daily. 60 tablet 2  . spironolactone (ALDACTONE) 25 MG tablet Take 0.5 tablets (12.5 mg total) by mouth daily. 45 tablet 3   No facility-administered medications prior to visit.     No Known Allergies  ROS     Objective:    Physical Exam  There were no vitals taken for this visit. Wt Readings from Last 3 Encounters:  02/27/19 178 lb 9.6 oz (81 kg)  02/08/19 190 lb (86.2 kg)  01/08/19 189 lb 8 oz (86 kg)    Health Maintenance Due  Topic Date Due  . FOOT EXAM   06/21/2018  . HEMOGLOBIN A1C  11/11/2018    There are no preventive care reminders to display for this patient.   Lab Results  Component Value Date   TSH 0.76 11/24/2018   Lab Results  Component Value Date   WBC 14.9 (H) 02/07/2019   HGB 7.9 (L) 02/07/2019   HCT 25.7 (L)  02/07/2019   MCV 102.8 (H) 02/07/2019   PLT 93 (L) 02/07/2019   Lab Results  Component Value Date   NA 140 02/07/2019   K 4.4 02/07/2019   CO2 41 (H) 02/07/2019   GLUCOSE 122 (H) 02/07/2019   BUN 30 (H) 02/07/2019   CREATININE 0.70 02/07/2019   BILITOT 1.9 (H) 01/27/2019   ALKPHOS 42 01/27/2019   AST 9 (L) 01/27/2019   ALT 12 01/27/2019   PROT 5.8 (L) 01/27/2019   ALBUMIN 2.8 (L) 01/27/2019   CALCIUM 8.5 (L) 02/07/2019   ANIONGAP 5 02/07/2019   GFR 37.87 (L) 02/11/2017   Lab Results  Component Value Date   CHOL 165 05/12/2018   Lab Results  Component Value Date   HDL 48 (L) 05/12/2018   Lab Results  Component Value Date   LDLCALC 101 (H) 05/12/2018   Lab Results  Component Value Date   TRIG 73 05/12/2018   Lab Results  Component Value Date   CHOLHDL 3.4 05/12/2018   Lab Results  Component Value Date   HGBA1C 5.9 (H) 05/12/2018       Assessment & Plan:   Problem List Items Addressed This Visit      Active Problems   MDS (myelodysplastic syndrome) ([place]) - Primary   Relevant Orders   Comprehensive metabolic panel   CBC w/Diff       No orders of the defined types were placed in this encounter.    Elby Showers, MD

## 2019-03-05 ENCOUNTER — Encounter: Payer: Self-pay | Admitting: Internal Medicine

## 2019-03-05 ENCOUNTER — Telehealth: Payer: Self-pay | Admitting: Cardiovascular Disease

## 2019-03-05 ENCOUNTER — Telehealth: Payer: Self-pay | Admitting: Internal Medicine

## 2019-03-05 DIAGNOSIS — I110 Hypertensive heart disease with heart failure: Secondary | ICD-10-CM | POA: Diagnosis not present

## 2019-03-05 DIAGNOSIS — D630 Anemia in neoplastic disease: Secondary | ICD-10-CM | POA: Diagnosis not present

## 2019-03-05 DIAGNOSIS — I5023 Acute on chronic systolic (congestive) heart failure: Secondary | ICD-10-CM | POA: Diagnosis not present

## 2019-03-05 DIAGNOSIS — D469 Myelodysplastic syndrome, unspecified: Secondary | ICD-10-CM | POA: Diagnosis not present

## 2019-03-05 DIAGNOSIS — J9601 Acute respiratory failure with hypoxia: Secondary | ICD-10-CM | POA: Diagnosis not present

## 2019-03-05 DIAGNOSIS — J15212 Pneumonia due to Methicillin resistant Staphylococcus aureus: Secondary | ICD-10-CM | POA: Diagnosis not present

## 2019-03-05 NOTE — Telephone Encounter (Signed)
Left a detailed message, per dpr, about changing her appointment to in-office. Appointment corrected.

## 2019-03-05 NOTE — Telephone Encounter (Signed)
Patient called about her 7/13 virtual appt she wants to switch that to an in office appt. She states that her PCP advised her it would be better if she would come into the office instead of a virtual visit.

## 2019-03-05 NOTE — Telephone Encounter (Signed)
Verbal order to wean supplemental oxygen and use prn

## 2019-03-06 DIAGNOSIS — I110 Hypertensive heart disease with heart failure: Secondary | ICD-10-CM | POA: Diagnosis not present

## 2019-03-06 DIAGNOSIS — I5023 Acute on chronic systolic (congestive) heart failure: Secondary | ICD-10-CM | POA: Diagnosis not present

## 2019-03-06 DIAGNOSIS — J9601 Acute respiratory failure with hypoxia: Secondary | ICD-10-CM | POA: Diagnosis not present

## 2019-03-06 DIAGNOSIS — D630 Anemia in neoplastic disease: Secondary | ICD-10-CM | POA: Diagnosis not present

## 2019-03-06 DIAGNOSIS — J15212 Pneumonia due to Methicillin resistant Staphylococcus aureus: Secondary | ICD-10-CM | POA: Diagnosis not present

## 2019-03-06 DIAGNOSIS — D469 Myelodysplastic syndrome, unspecified: Secondary | ICD-10-CM | POA: Diagnosis not present

## 2019-03-06 LAB — COMPREHENSIVE METABOLIC PANEL
AG Ratio: 1.7 (calc) (ref 1.0–2.5)
ALT: 8 U/L (ref 6–29)
AST: 10 U/L (ref 10–35)
Albumin: 3.8 g/dL (ref 3.6–5.1)
Alkaline phosphatase (APISO): 55 U/L (ref 37–153)
BUN: 16 mg/dL (ref 7–25)
CO2: 32 mmol/L (ref 20–32)
Calcium: 9.3 mg/dL (ref 8.6–10.4)
Chloride: 99 mmol/L (ref 98–110)
Creat: 0.84 mg/dL (ref 0.60–0.93)
Globulin: 2.3 g/dL (calc) (ref 1.9–3.7)
Glucose, Bld: 111 mg/dL — ABNORMAL HIGH (ref 65–99)
Potassium: 4.4 mmol/L (ref 3.5–5.3)
Sodium: 142 mmol/L (ref 135–146)
Total Bilirubin: 0.9 mg/dL (ref 0.2–1.2)
Total Protein: 6.1 g/dL (ref 6.1–8.1)

## 2019-03-06 LAB — HOUSE ACCOUNT TRACKING

## 2019-03-07 DIAGNOSIS — R0902 Hypoxemia: Secondary | ICD-10-CM | POA: Diagnosis not present

## 2019-03-07 DIAGNOSIS — I5022 Chronic systolic (congestive) heart failure: Secondary | ICD-10-CM | POA: Diagnosis not present

## 2019-03-07 DIAGNOSIS — D469 Myelodysplastic syndrome, unspecified: Secondary | ICD-10-CM | POA: Diagnosis not present

## 2019-03-08 DIAGNOSIS — J15212 Pneumonia due to Methicillin resistant Staphylococcus aureus: Secondary | ICD-10-CM | POA: Diagnosis not present

## 2019-03-08 DIAGNOSIS — D469 Myelodysplastic syndrome, unspecified: Secondary | ICD-10-CM | POA: Diagnosis not present

## 2019-03-08 DIAGNOSIS — J9601 Acute respiratory failure with hypoxia: Secondary | ICD-10-CM | POA: Diagnosis not present

## 2019-03-08 DIAGNOSIS — I110 Hypertensive heart disease with heart failure: Secondary | ICD-10-CM | POA: Diagnosis not present

## 2019-03-08 DIAGNOSIS — I5023 Acute on chronic systolic (congestive) heart failure: Secondary | ICD-10-CM | POA: Diagnosis not present

## 2019-03-08 DIAGNOSIS — D630 Anemia in neoplastic disease: Secondary | ICD-10-CM | POA: Diagnosis not present

## 2019-03-09 ENCOUNTER — Other Ambulatory Visit: Payer: Self-pay | Admitting: Internal Medicine

## 2019-03-09 ENCOUNTER — Inpatient Hospital Stay: Payer: Medicare Other

## 2019-03-09 ENCOUNTER — Inpatient Hospital Stay: Payer: Medicare Other | Attending: Hematology and Oncology | Admitting: Internal Medicine

## 2019-03-09 ENCOUNTER — Other Ambulatory Visit: Payer: Self-pay

## 2019-03-09 VITALS — BP 118/52 | HR 90 | Temp 98.7°F | Resp 18 | Ht 60.0 in | Wt 180.8 lb

## 2019-03-09 DIAGNOSIS — E611 Iron deficiency: Secondary | ICD-10-CM

## 2019-03-09 DIAGNOSIS — I10 Essential (primary) hypertension: Secondary | ICD-10-CM | POA: Diagnosis not present

## 2019-03-09 DIAGNOSIS — D469 Myelodysplastic syndrome, unspecified: Secondary | ICD-10-CM | POA: Diagnosis not present

## 2019-03-09 DIAGNOSIS — E039 Hypothyroidism, unspecified: Secondary | ICD-10-CM | POA: Diagnosis not present

## 2019-03-09 DIAGNOSIS — I5022 Chronic systolic (congestive) heart failure: Secondary | ICD-10-CM

## 2019-03-09 DIAGNOSIS — Z79899 Other long term (current) drug therapy: Secondary | ICD-10-CM | POA: Insufficient documentation

## 2019-03-09 LAB — CMP (CANCER CENTER ONLY)
ALT: 10 U/L (ref 0–44)
AST: 10 U/L — ABNORMAL LOW (ref 15–41)
Albumin: 3.5 g/dL (ref 3.5–5.0)
Alkaline Phosphatase: 62 U/L (ref 38–126)
Anion gap: 9 (ref 5–15)
BUN: 13 mg/dL (ref 8–23)
CO2: 32 mmol/L (ref 22–32)
Calcium: 9.4 mg/dL (ref 8.9–10.3)
Chloride: 101 mmol/L (ref 98–111)
Creatinine: 0.88 mg/dL (ref 0.44–1.00)
GFR, Est AFR Am: >60 mL/min (ref >60)
GFR, Estimated: >60 mL/min (ref >60)
Glucose, Bld: 120 mg/dL — ABNORMAL HIGH (ref 70–99)
Potassium: 4.1 mmol/L (ref 3.5–5.1)
Sodium: 142 mmol/L (ref 135–145)
Total Bilirubin: 1.2 mg/dL (ref 0.3–1.2)
Total Protein: 6.3 g/dL — ABNORMAL LOW (ref 6.5–8.1)

## 2019-03-09 LAB — CBC WITH DIFFERENTIAL (CANCER CENTER ONLY)
Abs Immature Granulocytes: 0.02 10*3/uL (ref 0.00–0.07)
Basophils Absolute: 0 10*3/uL (ref 0.0–0.1)
Basophils Relative: 0 %
Eosinophils Absolute: 0 10*3/uL (ref 0.0–0.5)
Eosinophils Relative: 0 %
HCT: 24.5 % — ABNORMAL LOW (ref 36.0–46.0)
Hemoglobin: 7.8 g/dL — ABNORMAL LOW (ref 12.0–15.0)
Immature Granulocytes: 0 %
Lymphocytes Relative: 27 %
Lymphs Abs: 1.3 10*3/uL (ref 0.7–4.0)
MCH: 30.6 pg (ref 26.0–34.0)
MCHC: 31.8 g/dL (ref 30.0–36.0)
MCV: 96.1 fL (ref 80.0–100.0)
Monocytes Absolute: 0.3 10*3/uL (ref 0.1–1.0)
Monocytes Relative: 6 %
Neutro Abs: 3.3 10*3/uL (ref 1.7–7.7)
Neutrophils Relative %: 67 %
Platelet Count: 145 10*3/uL — ABNORMAL LOW (ref 150–400)
RBC: 2.55 MIL/uL — ABNORMAL LOW (ref 3.87–5.11)
RDW: 18.1 % — ABNORMAL HIGH (ref 11.5–15.5)
WBC Count: 4.9 10*3/uL (ref 4.0–10.5)
nRBC: 4.9 % — ABNORMAL HIGH (ref 0.0–0.2)

## 2019-03-09 LAB — LACTATE DEHYDROGENASE: LDH: 141 U/L (ref 98–192)

## 2019-03-09 LAB — SAMPLE TO BLOOD BANK

## 2019-03-09 NOTE — Progress Notes (Signed)
Diagnosis MDS (myelodysplastic syndrome) ([place]) - Plan: CBC with Differential ([place] Only), Sample to Blood Bank, CBC with Differential ([place] Only), CMP ([place] only), Lactate dehydrogenase (LDH), Sample to Blood Bank  Staging Cancer Staging No matching staging information was found for the patient.  Assessment and Plan:  1.  Myelodysplastic syndrome, Excess blasts.  73 year old previously followed by Dr. Audelia Hives.  Pt has past medical history significant for dyslipidemia; compensated systolic congestive heart failure; placement of a defibrillator in 2011; nonischemic cardiomyopathy; bilateral nonsurgical cataracts; hypothyroid disease; mitral regurgitation; elevated BMI; diverticular disease; gastroesophageal reflux disease with dysphagia; and a serrated sessile polyp (2) identified initially 3 years earlier. Her primary care physician is Dr. Tedra Senegal.  Her gastroenterologist is Dr. Zenovia Jarred.  Over the past 6 months, she was aware that she was anemic.   Review of her laboratory studies from May 01, 2015 show hemoglobin 11.6 hematocrit 33.3 WBC 3.8; platelets 307,000.  On December 05, 2015: A complete blood count showed hemoglobin 11.1 hematocrit 33.4 WBC 5.6; platelets 339,000.  On May 03, 2017: A complete blood count showed hemoglobin 10.9 hematocrit 33.9 MCV 103.7 MCH 33.3 RDW 17.4 WBC 5.0 with 47% neutrophils 43% lymphocytes 7% monocytes 2% eosinophils 1% basophil; platelets 349,000.  More recently, on May 12, 2018: A complete blood count showed hemoglobin 9.7 hematocrit 29.0 MCV 101.4 MCH 33.9 RDW 17.3 WBC 4.2 with 47% neutrophils 43% lymphocytes 7% monocytes 2% eosinophils 1% basophil; platelets 333,000.  On May 25, 2018: A complete blood count showed hemoglobin 9.6 hematocrit 28.8 MCV 101.4 MCH 33.8 RDW 17.9 WBC 3.8 with 39% neutrophils 51% lymphocytes 7% monocytes 2% eosinophils 1% basophil; platelets 338,000.  She  had in the past 2 colonoscopies.  In 2015 a  Cologuard was positive.  A serrated sessile polyp was removed from the ascending colon at that time.  A follow-up colonoscopy one year later on February 25, 2015 revealed a second 5 mm sessile serrated polyp in the descending colon. The prior polypectomy site in the ascending colon showed no residual polyp.  She denies any bright red blood per rectum or melena.  She has no significant change in bowel habits although her stools vary from constipation to loose to normal.  She denies abdominal pain or tenesmus.  She has had dyspepsia and dysphagia in spite of omeprazole. She denies odynophagia. Solid food seems to get stuck in her epigastrium.   Pt was treated with IV iron 09/22/2018 and 09/29/2018.    Labs done 11/02/2018 showed WBC 4 HB 8.7 plts 290,000.  Chemistries WNL with K+ 3.4 Cr 0.83 normal LFTs.  Ferritin improved at 289.   Labs done 11/24/2018 showed WBC 5.8 Hb 7.2 plts 264,000.  Pt was transfused 11/28/2018.    Labs done  12/04/2018 showed WBC 3.5 HB 8.3 plts 249,000.  Chemistries WNL with K+ 3.5 Cr 0.86 and normal LFTs.  Ferritin 408 B12 1081.    HB remained decreased at 8.3 despite IV iron and blood transfusion. She is on folic acid and oral N23 supplements. Pt was recommended for bone marrow aspirate and biopsy for further evaluation due to persistent macrocytosis and anemia to rule out MDS.     Bone marrow biopsy done 12/15/2018 reviewed and showed  Bone Marrow, Aspirate,Biopsy, and Clot, right iliac BONE MARROW: - HYPERCELLULAR MARROW WITH MULTILINEAGE DYSPLASIA, RING SIDEROBLASTS AND INCREASED BLASTS (5-9% BY CD34 IMMUNOHISTOCHEMISTRY) - SEE COMMENT PERIPHERAL BLOOD: - MACROCYTIC ANEMIA - SEE COMPLETE BLOOD COUNT Diagnosis Note The overall features in  the marrow are consistent with a myelodysplastic syndrome with excess blasts-1 (MDS-EB-1); correlation with cytogenetics and FISH (MDS panel) is recommended.  Cytogenetics were normal.    FISH: NORMAL Probes analyzed (5, 7, and 8)  NGS  done 12/15/2018 showed CBL mutations which are associated with decreased survival in MDS, RUNX1 mutations that are associated with an unfavorable prognosis.   She had negative FLT3, IDH1 and IDH2 mutations.    I have again discussed with her and her case manager via phone her diagnosis of MDS.  Based on IPSS scoring system she would be intermediate to high risk due to % of blasts (5-9%) and anemia.  Usually in higher risk pts allogeneic SCT offers the greatest potential for long-term disease control, but may cause substantial toxicity. Hypomethylating agents such as Vidaza and Dacogen are associated with less toxicity than either transplantation or intensive chemotherapy and can provide symptomatic improvement but do not offer the possibility of cure.  Based on her significant cardiomyopathy with ECHO done 07/17/2018 showing EF of 25-30%, she would not likely be a candidate for SCT.  ECHO done 01/28/2019 showed EF 20-25%.  She was presented options of therapy with Vidaza dosed at 75 mg/m2 per day for 5 days initially.  This may expand to 7 days pending tolerance of therapy.  Pt was planned for 4-6 months of therapy with repeat evaluation and option of maintenance therapy.  Vidaza is a option for pt not suitable for transplant and provides symptomatic improvement with tolerability of therapy and some survival benefit but does not offer cure. I have also discussed Dacogen as option in MDS.   Goals of therapy reviewed and are for disease control.    Pt was treated with Vidaza beginning 01/08/2019 with most recent dose on 01/12/2019.    She had lab monitoring and was treated with blood transfusions.    Pt was admitted on 01/22/2019 and was discharged on 02/08/2019 due to Acute hypoxic respiratory failure secondary to acute on chronic systolic congestive heart failure and acute lung injury: -- Initially treated as pneumonia with Rocephin and azithromycin. Pneumonia ruled out. COVID-19 negative. Respiratory virus  panel was negative.  -- Treated for acute exacerbation of chronic systolic heart failure. Followed by heart failure team.  She was discharged to SNF.    Pt had blood work done 03/02/2019 that showed WBC 4.9 HB 8.7 plts 133,000.  Labs done today 03/09/2019 reviewed and showed WBC 4.9 HB 7.8 plts 145,000.  Transfusion not recommended today due to HB 7.8.    Pt reports she feels better since discharge and does not desire to resume therapy currently.  She was wondering if dose modification would be possible and I discussed with her that usual recommendations are for 7 day course of therapy and she was initially recommended for 5 days which was a shorter recommended course of therapy.  I have discussed with her due to intermediate and high risk MDS there is concern for progression to an acute leukemia. Based on her prognostic score category median survival is usually 1-2 years with risk to AML evolution of 1-2 years.  Her poor cardiac function also likely plays a role in her lack of tolerance of therapy.    Pt desires to remain on observation and will have repeat labs on 03/14/2019 and possible transfusion pending results.  All questions answered and they expressed understanding of information presented.    2.  CHF and SOB.  Pt is not currently on oxygen. She reports breathing is  improved since hospital discharge.  Pulse ox today is 90% on room air.  She had questions about need for home oxygen.  I discussed with her pulse ox would have to be at lower range to recommend home O2 currently.  Pt advised to notify the office if any change in symptoms or speak with cardiology.  Pt reports she has an appointment with cardiology in 03/2019.  We will follow-up Cardiology reports.  Pt has defibrillator in place. Echo done 07/17/2018 showed EF 25-30%.  Pt had ECHO done 01/28/2019 that showed EF 20-25%.    3.  Anemia.  Due to MDS. HB today is 7.8.  Pt will have repeat labs 03/14/2019 and will transfuse if HB 7. Will continue  to monitor counts.   4  Hypothyroidism.  Follow-up with PCP for monitoring.   5.  HTN.  BP is 118/52.  Follow-up with PCP.   6.  Health maintenance.  Pt had colonoscopy in 2016 that showed a polyp but no evidence of dysplasia.  EGD done 08/07/2018 showed single gastric polyp and evidence of gastritis.  Follow-up with GI as recommended.  Mammogram was 05/2018 and was negative.  Continue mammogram screenings as recommended.    25 minutes spent with more than 50% spent in review of records, counseling and coordination of care.    Interval History:  Historical data obtained from note dated 08/03/2018.  73 year old female previously followed by Dr. Audelia Hives.  Pt has history of serrated sessile polyp (2) identified initially 3 years earlier. Her primary care physician is Dr. Tedra Senegal.  Her gastroenterologist is Dr. Zenovia Jarred.  Over the past 6 months, she was aware that she was anemic.   Laboratory studies from May 01, 2015 show hemoglobin 11.6 hematocrit 33.3 WBC 3.8; platelets 307,000.  On December 05, 2015: A complete blood count showed hemoglobin 11.1 hematocrit 33.4 WBC 5.6; platelets 339,000.  On May 03, 2017: A complete blood count showed hemoglobin 10.9 hematocrit 33.9 MCV 103.7 MCH 33.3 RDW 17.4 WBC 5.0 with 47% neutrophils 43% lymphocytes 7% monocytes 2% eosinophils 1% basophil; platelets 349,000.  More recently, on May 12, 2018: A complete blood count showed hemoglobin 9.7 hematocrit 29.0 MCV 101.4 MCH 33.9 RDW 17.3 WBC 4.2 with 47% neutrophils 43% lymphocytes 7% monocytes 2% eosinophils 1% basophil; platelets 333,000.  On May 25, 2018: A complete blood count showed hemoglobin 9.6 hematocrit 28.8 MCV 101.4 MCH 33.8 RDW 17.9 WBC 3.8 with 39% neutrophils 51% lymphocytes 7% monocytes 2% eosinophils 1% basophil; platelets 338,000.  She has had 2 colonoscopies.  In 2015 a Cologuard was positive.  A serrated sessile polyp was removed from the ascending colon at that time.  A follow-up  colonoscopy one year later on February 25, 2015 revealed a second 5 mm sessile serrated polyp in the descending colon. The prior polypectomy site in the ascending colon showed no residual polyp.  She denies any bright red blood per rectum or melena.  She has no significant change in bowel habits although her stools vary from constipation to loose to normal.  She denies abdominal pain or tenesmus.  She has had dyspepsia and dysphagia in spite of omeprazole. She denies odynophagia. Solid food seems to get stuck in her epigastrium.    Current Status:  Pt is seen today for follow-up.  She is here to go over lab work.  She is questioning need for home oxygen.     Oncology History  MDS (myelodysplastic syndrome) ([place])  12/27/2018 Initial Diagnosis   MDS (  myelodysplastic syndrome) ([place])   01/08/2019 -  Chemotherapy   The patient had azaCITIDine (VIDAZA) chemo injection 142.5 mg, 75 mg/m2 = 142.5 mg, Subcutaneous,  Once, 1 of 4 cycles Administration: 142.5 mg (01/08/2019), 142.5 mg (01/09/2019), 142.5 mg (01/10/2019), 142.5 mg (01/12/2019)  for chemotherapy treatment.       Problem List Patient Active Problem List   Diagnosis Date Noted  . Acute systolic heart failure ([place]) [I50.21]   . Acute on chronic systolic CHF (congestive heart failure), NYHA class 4 (HCC) [I50.23]   . Dyspnea [R06.00]   . Congestive dilated cardiomyopathy ([place]) [I42.0]   . Respiratory failure ([place]) [J96.90] 01/23/2019  . Multifocal pneumonia [J18.9] 01/22/2019  . Acute respiratory failure with hypoxia ([place]) [J96.01]   . MDS (myelodysplastic syndrome) ([place]) [D46.9] 12/27/2018  . Iron deficiency [E61.1] 09/21/2018  . Symptomatic anemia [D64.9] 08/03/2018  . Impaired glucose tolerance [R73.02] 06/21/2017  . Cough variant asthma  vs UACS from ACEi  [J45.991] 12/31/2016  . Essential hypertension [I10] 12/31/2016  . Morbid obesity due to excess calories ([place]) [E66.01] 12/31/2016  . History of colonic polyps [Z86.010]   . Benign  neoplasm of descending colon [D12.4]   . Diverticulosis of colon without hemorrhage [K57.30] 01/28/2014  . 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 Nonspecific abnormal finding in stool contents [R19.5] 11/29/2013  . Colon cancer screening [Z12.11] 11/29/2013  . History of depression [Z86.59] 08/11/2012  . Chronic systolic CHF (congestive heart failure) ([place]) [I50.22]   . Systolic CHF, chronic ([place]) [I50.22]   . LBBB (left bundle branch block) [I44.7]   . Automatic implantable cardioverter-defibrillator in situ [Z95.810]   . Hypothyroidism [E03.9]   . GERD (gastroesophageal reflux disease) [K21.9]   . Dyslipidemia [E78.5]   . Mitral regurgitation [I34.0]   . Overweight [E66.3]   . Ejection fraction < 50% [R94.30]     Past Medical History Past Medical History:  Diagnosis Date  . Automatic implantable cardioverter-defibrillator in situ    Dr. Beckie Salts follows-next visit- [phone]  . Cataract   . Diverticulosis   . Dyslipidemia   . Ejection fraction < 50%    EF 25-30%, September, 2010  . GERD (gastroesophageal reflux disease)   . Hypothyroidism   . ICD (implantable cardiac defibrillator) in place    10/2009; Dr. Lovena Le  . Iron deficiency 09/21/2018  . LBBB (left bundle branch block)    old  . Macrocytic anemia   . MDS (myelodysplastic syndrome) ([place]) 12/27/2018  . Mitral regurgitation    Mild, echo, September, 2010  . Nonischemic cardiomyopathy ([place])    Normal coronary arteries, catheterization, September, 2009  . Overweight(278.02)   . Serrated polyp of colon   . Systolic CHF, chronic (HCC)    no recent problems    Past Surgical History Past Surgical History:  Procedure Laterality Date  . BIOPSY  08/07/2018   Procedure: BIOPSY;  Surgeon: Jerene Bears, MD;  Location: [facility];  Service: Gastroenterology;;  . CARDIAC CATHETERIZATION     '09 last  . [place]    . COLONOSCOPY N/A 11/29/2013   Procedure: COLONOSCOPY;  Surgeon: Jerene Bears,  MD;  Location: [facility];  Service: Gastroenterology;  Laterality: N/A;  . COLONOSCOPY N/A 02/25/2015   Procedure: COLONOSCOPY;  Surgeon: Jerene Bears, MD;  Location: [facility];  Service: Gastroenterology;  Laterality: N/A;  . ESOPHAGOGASTRODUODENOSCOPY (EGD) WITH PROPOFOL N/A 08/07/2018   Procedure: ESOPHAGOGASTRODUODENOSCOPY (EGD) WITH PROPOFOL;  Surgeon: Jerene Bears, MD;  Location: [facility];  Service: Gastroenterology;  Laterality: N/A;  . EYE SURGERY Left    x 3-"droopy eyelid"  . heart catherization    . TONSILLECTOMY  1952    Family History Family History  Problem Relation Age of Onset  . Heart attack Mother   . Heart disease Mother   . Hypertension Mother   . Lung cancer Father   . Pancreatic cancer Father   . Cancer Father   . Stroke Paternal Grandfather   . Colon cancer Neg Hx   . Diabetes Neg Hx   . Kidney disease Neg Hx   . Liver disease Neg Hx      Social History  reports that she has never smoked. She has never used smokeless tobacco. She reports current alcohol use. She reports that she does not use drugs.  Medications  Current Outpatient Medications:  .  carvedilol (COREG) 25 MG tablet, Take 1 tablet (25 mg total) by mouth 2 (two) times daily., Disp: 180 tablet, Rfl: 3 .  Cyanocobalamin (VITAMIN B-12 PO), Take 1 tablet by mouth daily. gummies, Disp: , Rfl:  .  folic acid (FOLVITE) 1 MG tablet, TAKE 1 TABLET BY MOUTH EVERY DAY, Disp: 90 tablet, Rfl: 0 .  furosemide (LASIX) 40 MG tablet, Take 1 tablet (40 mg total) by mouth daily., Disp: 90 tablet, Rfl: 2 .  glucose blood (ACCU-CHEK AVIVA) test strip, Check daily.  For Diabetes 250.00, Disp: 100 each, Rfl: 12 .  Lancets MISC, Check glucose daily.  Diabetes 250.00, Disp: 100 each, Rfl: 11 .  levothyroxine (SYNTHROID) 150 MCG tablet, Take 1 tablet (150 mcg total) by mouth daily before breakfast., Disp: 90 tablet, Rfl: 0 .  pantoprazole (PROTONIX) 40 MG tablet, Take 1 tablet (40 mg total) by mouth daily.,  Disp: 90 tablet, Rfl: 3 .  Propylene Glycol (SYSTANE BALANCE) 0.6 % SOLN, Place 1 drop into both eyes daily as needed (for dry eyes)., Disp: , Rfl:  .  sacubitril-valsartan (ENTRESTO) 24-26 MG, Take 1 tablet by mouth 2 (two) times daily., Disp: 60 tablet, Rfl: 2 .  spironolactone (ALDACTONE) 25 MG tablet, Take 0.5 tablets (12.5 mg total) by mouth daily., Disp: 45 tablet, Rfl: 3 .  albuterol (PROVENTIL HFA;VENTOLIN HFA) 108 (90 Base) MCG/ACT inhaler, Inhale 2 puffs into the lungs every 6 (six) hours as needed for wheezing or shortness of breath. (Patient not taking: Reported on 03/09/2019), Disp: 1 Inhaler, Rfl: prn .  ipratropium-albuterol (DUONEB) 0.5-2.5 (3) MG/3ML SOLN, Take 3 mLs by nebulization every 4 (four) hours as needed (shortness of breath or wheezing)., Disp: , Rfl:  .  prochlorperazine (COMPAZINE) 10 MG tablet, Take 1 tablet (10 mg total) by mouth every 6 (six) hours as needed for nausea or vomiting. (Patient not taking: Reported on 03/09/2019), Disp: 30 tablet, Rfl: 1  Allergies Patient has no known allergies.  Review of Systems Review of Systems - Oncology ROS negative   Physical Exam  Vitals Wt Readings from Last 3 Encounters:  03/09/19 180 lb 12.8 oz (82 kg)  02/27/19 178 lb 9.6 oz (81 kg)  02/08/19 190 lb (86.2 kg)   Temp Readings from Last 3 Encounters:  03/09/19 98.7 F (37.1 C) (Oral)  02/08/19 97.9 F (36.6 C) (Oral)  01/16/19 98.9 F (37.2 C) (Oral)   BP Readings from Last 3 Encounters:  03/09/19 (!) 118/52  02/27/19 111/67  02/19/19 (!) 100/54   Pulse Readings from Last 3 Encounters:  03/09/19 90  02/27/19 82  02/08/19 81   Constitutional: Well-developed, well-nourished, and  in no distress.   HENT: Head: Normocephalic and atraumatic.  Mouth/Throat: No oropharyngeal exudate. Mucosa moist. Eyes: Pupils are equal, round, and reactive to light. Conjunctivae are normal. No scleral icterus.  Neck: Normal range of motion. Neck supple. No JVD present.   Cardiovascular: Normal rate, regular rhythm and normal heart sounds.  Exam reveals no gallop and no friction rub.   No murmur heard. Pulmonary/Chest: Effort normal and breath sounds normal. No respiratory distress. No wheezes.No rales.  Abdominal: Soft. Bowel sounds are normal. No distension. There is no tenderness. There is no guarding.  Musculoskeletal: No edema or tenderness.  Lymphadenopathy: No cervical, axillary or supraclavicular adenopathy.  Neurological: Alert and oriented to person, place, and time. No cranial nerve deficit.  Skin: Skin is warm and dry. No rash noted. No erythema. No pallor.  Psychiatric: Affect and judgment normal.   Labs Appointment on 03/09/2019  Component Date Value Ref Range Status  . LDH 03/09/2019 141  98 - 192 U/L Final   Performed at [facility], [place] [address] [place], [place] [address]  . Sodium 03/09/2019 142  135 - 145 mmol/L Final  . Potassium 03/09/2019 4.1  3.5 - 5.1 mmol/L Final  . Chloride 03/09/2019 101  98 - 111 mmol/L Final  . CO2 03/09/2019 32  22 - 32 mmol/L Final  . Glucose, Bld 03/09/2019 120* 70 - 99 mg/dL Final  . BUN 03/09/2019 13  8 - 23 mg/dL Final  . Creatinine 03/09/2019 0.88  0.44 - 1.00 mg/dL Final  . Calcium 03/09/2019 9.4  8.9 - 10.3 mg/dL Final  . Total Protein 03/09/2019 6.3* 6.5 - 8.1 g/dL Final  . Albumin 03/09/2019 3.5  3.5 - 5.0 g/dL Final  . AST 03/09/2019 10* 15 - 41 U/L Final  . ALT 03/09/2019 10  0 - 44 U/L Final  . Alkaline Phosphatase 03/09/2019 62  38 - 126 U/L Final  . Total Bilirubin 03/09/2019 1.2  0.3 - 1.2 mg/dL Final  . GFR, Est Non Af Am 03/09/2019 >60  >60 mL/min Final  . GFR, Est AFR Am 03/09/2019 >60  >60 mL/min Final  . Anion gap 03/09/2019 9  5 - 15 Final   Performed at [facility], [place] [address] [place], [place] [address]  . WBC Count 03/09/2019 4.9  4.0 - 10.5 K/uL Final  . RBC 03/09/2019 2.55* 3.87 - 5.11 MIL/uL Final  . Hemoglobin  03/09/2019 7.8* 12.0 - 15.0 g/dL Final  . HCT 03/09/2019 24.5* 36.0 - 46.0 % Final  . MCV 03/09/2019 96.1  80.0 - 100.0 fL Final  . MCH 03/09/2019 30.6  26.0 - 34.0 pg Final  . MCHC 03/09/2019 31.8  30.0 - 36.0 g/dL Final  . RDW 03/09/2019 18.1* 11.5 - 15.5 % Final  . Platelet Count 03/09/2019 145* 150 - 400 K/uL Final  . nRBC 03/09/2019 4.9* 0.0 - 0.2 % Final  . Neutrophils Relative % 03/09/2019 67  % Final  . Neutro Abs 03/09/2019 3.3  1.7 - 7.7 K/uL Final  . Lymphocytes Relative 03/09/2019 27  % Final  . Lymphs Abs 03/09/2019 1.3  0.7 - 4.0 K/uL Final  . Monocytes Relative 03/09/2019 6  % Final  . Monocytes Absolute 03/09/2019 0.3  0.1 - 1.0 K/uL Final  . Eosinophils Relative 03/09/2019 0  % Final  . Eosinophils Absolute 03/09/2019 0.0  0.0 - 0.5 K/uL Final  . Basophils Relative 03/09/2019 0  % Final  . Basophils Absolute 03/09/2019 0.0  0.0 - 0.1 K/uL Final  . WBC Morphology 03/09/2019 OCC METAS AND MYELOCYTES   Final  . Immature Granulocytes 03/09/2019 0  % Final  . Abs Immature Granulocytes 03/09/2019 0.02  0.00 - 0.07 K/uL Final   Performed at [facility], [place] [address], [place], [place] [address]  . Blood Bank Specimen 03/09/2019 SAMPLE AVAILABLE FOR TESTING   Final  . Sample Expiration 03/09/2019    Final                   Value:03/12/2019,2359 Performed at [facility], [place] [address] [place], [place] [address]      Pathology Orders Placed This Encounter  Procedures  . CBC with Differential (Cancer Center Only)    Standing Status:   Future    Standing Expiration Date:   03/08/2020  . CBC with Differential (Cancer Center Only)    Standing Status:   Future    Standing Expiration Date:   03/08/2020  . CMP ([place] only)    Standing Status:   Future    Standing Expiration Date:   03/08/2020  . Lactate dehydrogenase (LDH)    Standing Status:   Future    Standing Expiration Date:   03/08/2020  . Sample to Blood Bank     Standing Status:   Future    Standing Expiration Date:   03/08/2020  . Sample to Blood Bank    Standing Status:   Future    Standing Expiration Date:   03/08/2020       Zoila Shutter MD

## 2019-03-09 NOTE — Progress Notes (Signed)
Thanks agree entirely with your plan

## 2019-03-10 ENCOUNTER — Encounter: Payer: Self-pay | Admitting: Internal Medicine

## 2019-03-10 NOTE — Progress Notes (Signed)
   Subjective:    Patient ID: [name], female    DOB: [birth year], 73 y.o.   MRN: [id number]  HPI Patient was hospitalized from May 11 through May 28 With dyspnea and weakness after having presented here in her vehicle and found to be hypoxic.  She was complaining of shortness of breath.  Was found to have multifocal pneumonia treated with Rocephin and Zithromax.  She had significant respiratory distress and was found to have an acute exacerbation of systolic heart failure and started on IV Lasix and milrinone.  She was treated with BiPAP.  She was treated with high-dose steroids.  She was discharged on home oxygen.  Was sent to [facility] rehab facility in [place].  She was admitted there May 29.  She was discharged from skilled nursing facility on June 10.  She is now being followed by home health care personnel.  Her caseworker presents with her today.  Patient still lacks insight into her diagnosis.  She initially signed a DNR order around her last hospitalization but toward the end of that hospitalization she reversed her DNR.  I spent considerable time today perhaps 20 minutes addressing prognosis and consideration of DNR.  She has been repeatedly anemic and has required transfusions.  Dr. Walden Field has done  an excellent job managing her MDS.  Her cardiac situation complicates matters.  She is now requiring home oxygen.  She is on Entresto for heart failure.  She has hypothyroidism and GE reflux.  She is on both Lasix and spironolactone for diuretics.  She is on carvedilol.  Her ejection fraction is 20 to 25% based on echo in May.  Her recurrent anemia due to MDS affects her heart function and respiratory status.  I explained this to her today in detail. She was treated with Vidaza in late April.  She has an upcoming appointment with Dr. Walden Field later this week and will be discussing her treatment plan.   Review of Systems no other complaints     Objective:   Physical Exam Vital  signs reviewed.  She looks weak.  She is wearing her home oxygen.  Chest is clear to auscultation.  Cardiac exam regular rate and rhythm.  Extremities without pitting edema.       Assessment & Plan:  Status post hospitalization for multifocal pneumonia now on chronic oxygen therapy and at home followed by home health nurse  Myelodysplastic syndrome received first dose of Vidaza in late April  Discussion regarding prognosis and DNR status.  Patient would like to be resuscitated if she could be assured that she would not be left in a vegetative state.  Explained to her that this was difficult to determine at the time of code.  Chronic systolic heart failure  Hypothyroidism  History of allergic rhinitis and asthma  Plan: Continue with home health nursing.  See Dr. Rico Ala this coming Friday to discuss MDS treatment.  Continue current medications.

## 2019-03-12 DIAGNOSIS — I5023 Acute on chronic systolic (congestive) heart failure: Secondary | ICD-10-CM | POA: Diagnosis not present

## 2019-03-12 DIAGNOSIS — J9601 Acute respiratory failure with hypoxia: Secondary | ICD-10-CM | POA: Diagnosis not present

## 2019-03-12 DIAGNOSIS — I110 Hypertensive heart disease with heart failure: Secondary | ICD-10-CM | POA: Diagnosis not present

## 2019-03-12 DIAGNOSIS — D630 Anemia in neoplastic disease: Secondary | ICD-10-CM | POA: Diagnosis not present

## 2019-03-12 DIAGNOSIS — D469 Myelodysplastic syndrome, unspecified: Secondary | ICD-10-CM | POA: Diagnosis not present

## 2019-03-12 DIAGNOSIS — J15212 Pneumonia due to Methicillin resistant Staphylococcus aureus: Secondary | ICD-10-CM | POA: Diagnosis not present

## 2019-03-14 ENCOUNTER — Other Ambulatory Visit: Payer: Self-pay

## 2019-03-14 ENCOUNTER — Inpatient Hospital Stay: Payer: Medicare Other | Attending: Hematology and Oncology

## 2019-03-14 ENCOUNTER — Other Ambulatory Visit: Payer: Self-pay | Admitting: Internal Medicine

## 2019-03-14 ENCOUNTER — Telehealth: Payer: Self-pay | Admitting: Internal Medicine

## 2019-03-14 ENCOUNTER — Telehealth: Payer: Self-pay

## 2019-03-14 DIAGNOSIS — D469 Myelodysplastic syndrome, unspecified: Secondary | ICD-10-CM

## 2019-03-14 DIAGNOSIS — D649 Anemia, unspecified: Secondary | ICD-10-CM

## 2019-03-14 DIAGNOSIS — J15212 Pneumonia due to Methicillin resistant Staphylococcus aureus: Secondary | ICD-10-CM | POA: Diagnosis not present

## 2019-03-14 DIAGNOSIS — I5023 Acute on chronic systolic (congestive) heart failure: Secondary | ICD-10-CM | POA: Diagnosis not present

## 2019-03-14 DIAGNOSIS — J9601 Acute respiratory failure with hypoxia: Secondary | ICD-10-CM | POA: Diagnosis not present

## 2019-03-14 DIAGNOSIS — E611 Iron deficiency: Secondary | ICD-10-CM

## 2019-03-14 DIAGNOSIS — I110 Hypertensive heart disease with heart failure: Secondary | ICD-10-CM | POA: Diagnosis not present

## 2019-03-14 DIAGNOSIS — D630 Anemia in neoplastic disease: Secondary | ICD-10-CM | POA: Diagnosis not present

## 2019-03-14 LAB — CBC WITH DIFFERENTIAL (CANCER CENTER ONLY)
Abs Immature Granulocytes: 0.02 10*3/uL (ref 0.00–0.07)
Basophils Absolute: 0 10*3/uL (ref 0.0–0.1)
Basophils Relative: 0 %
Eosinophils Absolute: 0 10*3/uL (ref 0.0–0.5)
Eosinophils Relative: 0 %
HCT: 23 % — ABNORMAL LOW (ref 36.0–46.0)
Hemoglobin: 7.2 g/dL — ABNORMAL LOW (ref 12.0–15.0)
Immature Granulocytes: 1 %
Lymphocytes Relative: 34 %
Lymphs Abs: 1.3 10*3/uL (ref 0.7–4.0)
MCH: 30.3 pg (ref 26.0–34.0)
MCHC: 31.3 g/dL (ref 30.0–36.0)
MCV: 96.6 fL (ref 80.0–100.0)
Monocytes Absolute: 0.2 10*3/uL (ref 0.1–1.0)
Monocytes Relative: 6 %
Neutro Abs: 2.2 10*3/uL (ref 1.7–7.7)
Neutrophils Relative %: 59 %
Platelet Count: 138 10*3/uL — ABNORMAL LOW (ref 150–400)
RBC: 2.38 MIL/uL — ABNORMAL LOW (ref 3.87–5.11)
RDW: 18.6 % — ABNORMAL HIGH (ref 11.5–15.5)
WBC Count: 3.7 10*3/uL — ABNORMAL LOW (ref 4.0–10.5)
nRBC: 3 % — ABNORMAL HIGH (ref 0.0–0.2)

## 2019-03-14 LAB — SAMPLE TO BLOOD BANK

## 2019-03-14 LAB — FERRITIN: Ferritin: 950 ng/mL — ABNORMAL HIGH (ref 11–307)

## 2019-03-14 NOTE — Telephone Encounter (Signed)
Scheduled appt per 7/1 sch message- pt aware of appt date and time   

## 2019-03-14 NOTE — Progress Notes (Signed)
Confirmed type and screen and prepare orders with blood bank

## 2019-03-14 NOTE — Telephone Encounter (Signed)
Spoke with pt advised that Dr Walden Field would like pt to receive 1 unit of blood. Advised transfusion will be done on Friday. Advised to leave on blue wrist band and not take it off. Pt verbalized understanding.

## 2019-03-16 ENCOUNTER — Inpatient Hospital Stay: Payer: Medicare Other

## 2019-03-16 ENCOUNTER — Other Ambulatory Visit: Payer: Self-pay

## 2019-03-16 VITALS — BP 108/51 | HR 81 | Temp 98.1°F | Resp 16

## 2019-03-16 DIAGNOSIS — E611 Iron deficiency: Secondary | ICD-10-CM | POA: Diagnosis not present

## 2019-03-16 DIAGNOSIS — D469 Myelodysplastic syndrome, unspecified: Secondary | ICD-10-CM | POA: Diagnosis not present

## 2019-03-16 DIAGNOSIS — D649 Anemia, unspecified: Secondary | ICD-10-CM

## 2019-03-16 DIAGNOSIS — Z95828 Presence of other vascular implants and grafts: Secondary | ICD-10-CM

## 2019-03-16 MED ORDER — FUROSEMIDE 10 MG/ML IJ SOLN
INTRAMUSCULAR | Status: AC
  Filled 2019-03-16: qty 2

## 2019-03-16 MED ORDER — SODIUM CHLORIDE 0.9% FLUSH
10.0000 mL | INTRAVENOUS | Status: DC | PRN
  Filled 2019-03-16: qty 10

## 2019-03-16 MED ORDER — FUROSEMIDE 10 MG/ML IJ SOLN
20.0000 mg | Freq: Once | INTRAMUSCULAR | Status: AC
  Administered 2019-03-16: 12:00:00 20 mg via INTRAVENOUS

## 2019-03-16 MED ORDER — SODIUM CHLORIDE 0.9% IV SOLUTION
250.0000 mL | Freq: Once | INTRAVENOUS | Status: AC
  Administered 2019-03-16: 09:00:00 250 mL via INTRAVENOUS
  Filled 2019-03-16: qty 250

## 2019-03-16 MED ORDER — HEPARIN SOD (PORK) LOCK FLUSH 100 UNIT/ML IV SOLN
500.0000 [IU] | Freq: Once | INTRAVENOUS | Status: DC
  Filled 2019-03-16: qty 5

## 2019-03-16 NOTE — Patient Instructions (Addendum)
Blood Transfusion, Adult A blood transfusion is a procedure in which you are given blood through an IV tube. You may need this procedure because of:  Illness.  Surgery.  Injury. The blood may come from someone else (a donor). You may also be able to donate blood for yourself (autologous blood donation). The blood given in a transfusion is made up of different types of cells. You may get:  Red blood cells. These carry oxygen to the cells in the body.  White blood cells. These help you fight infections.  Platelets. These help your blood to clot.  Plasma. This is the liquid part of your blood. It helps with fluid imbalances. If you have a clotting disorder, you may also get other types of blood products. What happens before the procedure?  You will have a blood test to find out your blood type. The test also finds out what type of blood your body will accept and matches it to the donor type.  If you are going to have a planned surgery, you may be able to donate your own blood. This may be done in case you need a transfusion.  If you have had an allergic reaction to a transfusion in the past, you may be given medicine to help prevent a reaction. This medicine may be given to you by mouth or through an IV.  You will have your temperature, blood pressure, and pulse checked.  Follow instructions from your doctor about what you cannot eat or drink.  Ask your doctor about: ? Changing or stopping your regular medicines. This is important if you take diabetes medicines or blood thinners. ? Taking medicines such as aspirin and ibuprofen. These medicines can thin your blood. Do not take these medicines before your procedure if your doctor tells you not to. What happens during the procedure?  An IV tube will be put into one of your veins.  The bag of donated blood will be attached to your IV tube. Then, the blood will enter through your vein.  Your temperature, blood pressure, and pulse will  be checked regularly during the procedure. This is done to find early signs of a transfusion reaction.  If you have any signs or symptoms of a reaction, your transfusion will be stopped. You may also be given medicine.  When the transfusion is done, your IV tube will be taken out.  Pressure may be applied to the IV site for a few minutes.  A bandage (dressing) will be put on the IV site. The procedure may vary among doctors and hospitals. What happens after the procedure?  Your temperature, blood pressure, heart rate, breathing rate, and blood oxygen level will be checked often.  Your blood may be tested to see how you are responding to the transfusion.  You may be warmed with fluids or blankets. This is done to keep the temperature of your body normal. Summary  A blood transfusion is a procedure in which you are given blood through an IV tube.  The blood may come from someone else (a donor). You may also be able to donate blood for yourself.  If you have had an allergic reaction to a transfusion in the past, you may be given medicine to help prevent a reaction. This medicine may be given to you by mouth or through an IV tube.  Your temperature, blood pressure, heart rate, breathing rate, and blood oxygen level will be checked often.  Your blood may be tested to see   how you are responding to the transfusion. This information is not intended to replace advice given to you by your health care provider. Make sure you discuss any questions you have with your health care provider. Document Released: 11/26/2008 Document Revised: 10/20/2016 Document Reviewed: 04/23/2016 Elsevier Patient Education  2020 Elsevier Inc.  

## 2019-03-17 LAB — TYPE AND SCREEN
ABO/RH(D): O POS
Antibody Screen: NEGATIVE
Unit division: 0

## 2019-03-17 LAB — BPAM RBC
Blood Product Expiration Date: 202007252359
ISSUE DATE / TIME: 202007030910
Unit Type and Rh: 5100

## 2019-03-19 DIAGNOSIS — D469 Myelodysplastic syndrome, unspecified: Secondary | ICD-10-CM | POA: Diagnosis not present

## 2019-03-19 DIAGNOSIS — I110 Hypertensive heart disease with heart failure: Secondary | ICD-10-CM | POA: Diagnosis not present

## 2019-03-19 DIAGNOSIS — I5023 Acute on chronic systolic (congestive) heart failure: Secondary | ICD-10-CM | POA: Diagnosis not present

## 2019-03-19 DIAGNOSIS — J9601 Acute respiratory failure with hypoxia: Secondary | ICD-10-CM | POA: Diagnosis not present

## 2019-03-19 DIAGNOSIS — D630 Anemia in neoplastic disease: Secondary | ICD-10-CM | POA: Diagnosis not present

## 2019-03-19 DIAGNOSIS — J15212 Pneumonia due to Methicillin resistant Staphylococcus aureus: Secondary | ICD-10-CM | POA: Diagnosis not present

## 2019-03-20 DIAGNOSIS — D469 Myelodysplastic syndrome, unspecified: Secondary | ICD-10-CM | POA: Diagnosis not present

## 2019-03-20 DIAGNOSIS — D630 Anemia in neoplastic disease: Secondary | ICD-10-CM | POA: Diagnosis not present

## 2019-03-20 DIAGNOSIS — I5023 Acute on chronic systolic (congestive) heart failure: Secondary | ICD-10-CM | POA: Diagnosis not present

## 2019-03-20 DIAGNOSIS — I110 Hypertensive heart disease with heart failure: Secondary | ICD-10-CM | POA: Diagnosis not present

## 2019-03-20 DIAGNOSIS — J9601 Acute respiratory failure with hypoxia: Secondary | ICD-10-CM | POA: Diagnosis not present

## 2019-03-20 DIAGNOSIS — J15212 Pneumonia due to Methicillin resistant Staphylococcus aureus: Secondary | ICD-10-CM | POA: Diagnosis not present

## 2019-03-23 ENCOUNTER — Telehealth: Payer: Self-pay | Admitting: Cardiovascular Disease

## 2019-03-23 NOTE — Telephone Encounter (Signed)
New Message         COVID-19 Pre-Screening Questions:   In the past 7 to 10 days have you had a cough,  shortness of breath, headache, congestion, fever (100 or greater) body aches, chills, sore throat, or sudden loss of taste or sense of smell? NO  Have you been around anyone with known Covid 19. NO  Have you been around anyone who is awaiting Covid 19 test results in the past 7 to 10 days? NO  Have you been around anyone who has been exposed to Covid 19, or has mentioned symptoms of Covid 19 within the past 7 to 10 days? NO Pt has a Case Manager named Anderson Malta who she would like to come to her appt with her, she says Anderson Malta will call Monday to answer COVID questions    If you have any concerns/questions about symptoms patients report during screening (either on the phone or at threshold). Contact the provider seeing the patient or DOD for further guidance.  If neither are available contact a member of the leadership team.

## 2019-03-24 DIAGNOSIS — D630 Anemia in neoplastic disease: Secondary | ICD-10-CM | POA: Diagnosis not present

## 2019-03-24 DIAGNOSIS — I428 Other cardiomyopathies: Secondary | ICD-10-CM | POA: Diagnosis not present

## 2019-03-24 DIAGNOSIS — K219 Gastro-esophageal reflux disease without esophagitis: Secondary | ICD-10-CM | POA: Diagnosis not present

## 2019-03-24 DIAGNOSIS — Z6834 Body mass index (BMI) 34.0-34.9, adult: Secondary | ICD-10-CM | POA: Diagnosis not present

## 2019-03-24 DIAGNOSIS — S27309D Unspecified injury of lung, unspecified, subsequent encounter: Secondary | ICD-10-CM | POA: Diagnosis not present

## 2019-03-24 DIAGNOSIS — I272 Pulmonary hypertension, unspecified: Secondary | ICD-10-CM | POA: Diagnosis not present

## 2019-03-24 DIAGNOSIS — D61818 Other pancytopenia: Secondary | ICD-10-CM | POA: Diagnosis not present

## 2019-03-24 DIAGNOSIS — Z1159 Encounter for screening for other viral diseases: Secondary | ICD-10-CM | POA: Diagnosis not present

## 2019-03-24 DIAGNOSIS — E669 Obesity, unspecified: Secondary | ICD-10-CM | POA: Diagnosis not present

## 2019-03-24 DIAGNOSIS — I5023 Acute on chronic systolic (congestive) heart failure: Secondary | ICD-10-CM | POA: Diagnosis not present

## 2019-03-24 DIAGNOSIS — D469 Myelodysplastic syndrome, unspecified: Secondary | ICD-10-CM | POA: Diagnosis not present

## 2019-03-24 DIAGNOSIS — Z9981 Dependence on supplemental oxygen: Secondary | ICD-10-CM | POA: Diagnosis not present

## 2019-03-24 DIAGNOSIS — K8020 Calculus of gallbladder without cholecystitis without obstruction: Secondary | ICD-10-CM | POA: Diagnosis not present

## 2019-03-24 DIAGNOSIS — J9601 Acute respiratory failure with hypoxia: Secondary | ICD-10-CM | POA: Diagnosis not present

## 2019-03-24 DIAGNOSIS — I700 Atherosclerosis of aorta: Secondary | ICD-10-CM | POA: Diagnosis not present

## 2019-03-24 DIAGNOSIS — I420 Dilated cardiomyopathy: Secondary | ICD-10-CM | POA: Diagnosis not present

## 2019-03-24 DIAGNOSIS — J9811 Atelectasis: Secondary | ICD-10-CM | POA: Diagnosis not present

## 2019-03-24 DIAGNOSIS — J15212 Pneumonia due to Methicillin resistant Staphylococcus aureus: Secondary | ICD-10-CM | POA: Diagnosis not present

## 2019-03-24 DIAGNOSIS — I110 Hypertensive heart disease with heart failure: Secondary | ICD-10-CM | POA: Diagnosis not present

## 2019-03-24 DIAGNOSIS — I959 Hypotension, unspecified: Secondary | ICD-10-CM | POA: Diagnosis not present

## 2019-03-24 DIAGNOSIS — E039 Hypothyroidism, unspecified: Secondary | ICD-10-CM | POA: Diagnosis not present

## 2019-03-24 DIAGNOSIS — Z950 Presence of cardiac pacemaker: Secondary | ICD-10-CM | POA: Diagnosis not present

## 2019-03-26 ENCOUNTER — Encounter: Payer: Self-pay | Admitting: Cardiovascular Disease

## 2019-03-26 ENCOUNTER — Telehealth: Payer: Self-pay | Admitting: Pulmonary Disease

## 2019-03-26 ENCOUNTER — Other Ambulatory Visit: Payer: Self-pay

## 2019-03-26 ENCOUNTER — Ambulatory Visit (INDEPENDENT_AMBULATORY_CARE_PROVIDER_SITE_OTHER): Payer: Medicare Other | Admitting: Cardiovascular Disease

## 2019-03-26 VITALS — BP 122/58 | HR 76 | Ht 60.0 in | Wt 177.8 lb

## 2019-03-26 DIAGNOSIS — I5022 Chronic systolic (congestive) heart failure: Secondary | ICD-10-CM | POA: Diagnosis not present

## 2019-03-26 DIAGNOSIS — I447 Left bundle-branch block, unspecified: Secondary | ICD-10-CM

## 2019-03-26 NOTE — Patient Instructions (Signed)
Medication Instructions:  Your physician recommends that you continue on your current medications as directed. Please refer to the Current Medication list given to you today.  If you need a refill on your cardiac medications before your next appointment, please call your pharmacy.    Lab work: None Ordered   Testing/Procedures: None Ordered   Follow-Up: At [facility], you and your health needs are our priority.  As part of our continuing mission to provide you with exceptional heart care, we have created designated Provider Care Teams.  These Care Teams include your primary Cardiologist (physician) and Advanced Practice Providers (APPs -  Physician Assistants and Nurse Practitioners) who all work together to provide you with the care you need, when you need it. You will need a follow up appointment in:  3 months on Tuesday October 13 at 10:20 am with [name], MD. In the future you may see one of the following Advanced Practice Providers on your designated Care Team: Richardson Dopp, PA-C [place], [place] . Daune Perch, NP

## 2019-03-26 NOTE — Progress Notes (Signed)
Cardiology Office Note   Date:  03/26/2019   ID:  [name], [name] [birth year], MRN [id number]  PCP:  Elby Showers, MD  Cardiologist: previous Ron Parker patient , now  [name], MD   Chief Complaint  Patient presents with  . Congestive Heart Failure   Problem List 1. Chronic systolic congestive heart failure 2. ICD 3. Hypothyroidism     [name] is a 73 y.o. female who presents today to follow up cardiomyopathy. She is doing very well. She is not having any chest pain or shortness of breath. She's had no syncope or presyncope. Her ICD is followed carefully by Dr. Lovena Le. I saw her last October, 2015. She had an echo shortly after that. Her EF was in the 30-35% range. This is slightly better than it had been. We have her on the maximum doses of medicines that she can tolerate.  December 09, 2015:  Doing well.    Former patient of Dr. Ron Parker.  No CP ,  Chronic DOE with activity .  No dyspnea with sitting or lying down .   Jan. 8, 2018:  Doing well.  Mild dyspnea when she exerts herself , no CP   Aug. 22, 2018: She has a history of  chronic systolic congestive heart failure. Her last echocardiogram was in 2015 which shows an ejection fraction of 30-35%. She has a defibrillator. Has some dyspnea when walking  Is on Coreg 25 BID, Losartan 25 mg a day , Lasix  She was on Spironolactone but this was stopped when she developed some renal insufficiency while on valsartan plus Spironolactone  Has started doing water aeorbics  Jan 11, 2018:  Previous Ron Parker patient  Hx of CHF ,   Has an ICD  BP is mildly elevated here.   Has been diagnosed with pneumonia by Dr. Renold Genta  - has been started on Abx.  Has prednisone, doxycycline, nebs   Nov. 11, 2019: [name] is a 73 year old female with a history of chronic systolic congestive heart failure.  Recent echocardiogram shows moderate to severe left ventricular dysfunction with an ejection fraction of 25 to 30%.  She has grade 2  diastolic dysfunction.  No syncope,  Has some DOE, No CP  Has some leg fatigue   October 23, 2018:  [name] is seen today for follow up  She and Dr. Lovena Le have decided to wait and upgrade her ICD at a later time  She is breathing better .   She is bening more careful with her salt intake   Has also had anemia  She has not had a sleep study .   March 26, 2019: Seen with [name] ,  Case Manager   [name] is seen back today for follow-up of her congestive heart failure.  She has an ICD.  She was admitted with respiratory failure in May, 2020.  This was thought to be due to acute on chronic systolic congestive heart failure.  She was treated with IV milrinone drip.  She was discharged on Entresto, Aldactone, carvedilol.  She has discussed upgrading her ICD .   Getting some exercise .   Has home PT ,  Some aerobic exercise .   Also has myelodysplastic syndrome. Her decompensation occurred the week following 1 of the treatments   Past Medical History:  Diagnosis Date  . Automatic implantable cardioverter-defibrillator in situ    Dr. Beckie Salts follows-next visit- [phone]  . Cataract   . Diverticulosis   . Dyslipidemia   .  Ejection fraction < 50%    EF 25-30%, September, 2010  . GERD (gastroesophageal reflux disease)   . Hypothyroidism   . ICD (implantable cardiac defibrillator) in place    10/2009; Dr. Lovena Le  . Iron deficiency 09/21/2018  . LBBB (left bundle branch block)    old  . Macrocytic anemia   . MDS (myelodysplastic syndrome) ([place]) 12/27/2018  . Mitral regurgitation    Mild, echo, September, 2010  . Nonischemic cardiomyopathy ([place])    Normal coronary arteries, catheterization, September, 2009  . Overweight(278.02)   . Serrated polyp of colon   . Systolic CHF, chronic (HCC)    no recent problems    Past Surgical History:  Procedure Laterality Date  . BIOPSY  08/07/2018   Procedure: BIOPSY;  Surgeon: Jerene Bears, MD;  Location: [facility];  Service:  Gastroenterology;;  . CARDIAC CATHETERIZATION     '09 last  . [place]    . COLONOSCOPY N/A 11/29/2013   Procedure: COLONOSCOPY;  Surgeon: Jerene Bears, MD;  Location: [facility];  Service: Gastroenterology;  Laterality: N/A;  . COLONOSCOPY N/A 02/25/2015   Procedure: COLONOSCOPY;  Surgeon: Jerene Bears, MD;  Location: [facility];  Service: Gastroenterology;  Laterality: N/A;  . ESOPHAGOGASTRODUODENOSCOPY (EGD) WITH PROPOFOL N/A 08/07/2018   Procedure: ESOPHAGOGASTRODUODENOSCOPY (EGD) WITH PROPOFOL;  Surgeon: Jerene Bears, MD;  Location: [facility];  Service: Gastroenterology;  Laterality: N/A;  . EYE SURGERY Left    x 3-"droopy eyelid"  . heart catherization    . TONSILLECTOMY  1952    Patient Active Problem List   Diagnosis Date Noted  . Chronic systolic CHF (congestive heart failure) ([place])     Priority: High  . Acute systolic heart failure ([place])   . Acute on chronic systolic CHF (congestive heart failure), NYHA class 4 ([place])   . Dyspnea   . Congestive dilated cardiomyopathy ([place])   . Respiratory failure ([place]) 01/23/2019  . Multifocal pneumonia 01/22/2019  . Acute respiratory failure with hypoxia ([place])   . MDS (myelodysplastic syndrome) ([place]) 12/27/2018  . Iron deficiency 09/21/2018  . Symptomatic anemia 08/03/2018  . Impaired glucose tolerance 06/21/2017  . Cough variant asthma  vs UACS from ACEi  12/31/2016  . Essential hypertension 12/31/2016  . Morbid obesity due to excess calories ([place]) 12/31/2016  . History of colonic polyps   . Benign neoplasm of descending colon   . Diverticulosis of colon without hemorrhage 01/28/2014  . Serrated adenoma of colon 01/28/2014  . Nonspecific abnormal finding in stool contents 11/29/2013  . Colon cancer screening 11/29/2013  . History of depression 08/11/2012  . Systolic CHF, chronic ([place])   . LBBB (left bundle branch block)   . Automatic implantable cardioverter-defibrillator in situ   . Hypothyroidism    . GERD (gastroesophageal reflux disease)   . Dyslipidemia   . Mitral regurgitation   . Overweight   . Ejection fraction < 50%       Current Outpatient Medications  Medication Sig Dispense Refill  . albuterol (PROVENTIL HFA;VENTOLIN HFA) 108 (90 Base) MCG/ACT inhaler Inhale 2 puffs into the lungs every 6 (six) hours as needed for wheezing or shortness of breath. 1 Inhaler prn  . carvedilol (COREG) 25 MG tablet Take 1 tablet (25 mg total) by mouth 2 (two) times daily. 180 tablet 3  . Cyanocobalamin (VITAMIN B-12 PO) Take 1 tablet by mouth daily. gummies    . folic acid (FOLVITE) 1 MG tablet TAKE 1 TABLET BY MOUTH EVERY DAY 90  tablet 0  . furosemide (LASIX) 40 MG tablet Take 1 tablet (40 mg total) by mouth daily. 90 tablet 2  . glucose blood (ACCU-CHEK AVIVA) test strip Check daily.  For Diabetes 250.00 100 each 12  . ipratropium-albuterol (DUONEB) 0.5-2.5 (3) MG/3ML SOLN Take 3 mLs by nebulization every 4 (four) hours as needed (shortness of breath or wheezing).    . Lancets MISC Check glucose daily.  Diabetes 250.00 100 each 11  . levothyroxine (SYNTHROID) 150 MCG tablet Take 1 tablet (150 mcg total) by mouth daily before breakfast. 90 tablet 0  . pantoprazole (PROTONIX) 40 MG tablet Take 1 tablet (40 mg total) by mouth daily. 90 tablet 3  . prochlorperazine (COMPAZINE) 10 MG tablet Take 1 tablet (10 mg total) by mouth every 6 (six) hours as needed for nausea or vomiting. 30 tablet 1  . Propylene Glycol (SYSTANE BALANCE) 0.6 % SOLN Place 1 drop into both eyes daily as needed (for dry eyes).    . sacubitril-valsartan (ENTRESTO) 24-26 MG Take 1 tablet by mouth 2 (two) times daily. 60 tablet 2  . spironolactone (ALDACTONE) 25 MG tablet Take 0.5 tablets (12.5 mg total) by mouth daily. 45 tablet 3   No current facility-administered medications for this visit.     Allergies:   Patient has no known allergies.    Social History:  The patient  reports that she has never smoked. She has never  used smokeless tobacco. She reports current alcohol use. She reports that she does not use drugs.   Family History:  The patient's family history includes Cancer in her father; Heart attack in her mother; Heart disease in her mother; Hypertension in her mother; Lung cancer in her father; Pancreatic cancer in her father; Stroke in her paternal grandfather.   ROS:    As in HPI, otherwise ROS is negative   Physical Exam: Blood pressure (!) 122/58, pulse 76, height 5' (1.524 m), weight 177 lb 12.8 oz (80.6 kg), SpO2 97 %.  GEN:   Elderly, moderately obese female.  NAD  HEENT: Normal NECK: No JVD; No carotid bruits LYMPHATICS: No lymphadenopathy CARDIAC: RRR   RESPIRATORY:  Clear to auscultation without rales, wheezing or rhonchi  ABDOMEN: Soft, non-tender, non-distended MUSCULOSKELETAL:  No edema; No deformity  SKIN: Warm and dry NEUROLOGIC:  Alert and oriented x 3    EKG:      Recent Labs: 11/24/2018: TSH 0.76 01/28/2019: B Natriuretic Peptide 1,421.8 02/01/2019: Magnesium 2.4 03/09/2019: ALT 8; BUN 16; Creat 0.84; Potassium 4.4; Sodium 142 03/14/2019: Hemoglobin 7.2; Platelet Count 138    Lipid Panel    Component Value Date/Time   CHOL 165 05/12/2018 0913   TRIG 73 05/12/2018 0913   HDL 48 (L) 05/12/2018 0913   CHOLHDL 3.4 05/12/2018 0913   VLDL 15 05/03/2017 1021   LDLCALC 101 (H) 05/12/2018 0913      Wt Readings from Last 3 Encounters:  03/26/19 177 lb 12.8 oz (80.6 kg)  03/09/19 180 lb 12.8 oz (82 kg)  02/27/19 178 lb 9.6 oz (81 kg)      Current medicines are reviewed  The patient understands her medications.     ASSESSMENT AND PLAN:  1. Chronic systolic congestive heart failure:   Was eating lots of salt.   She is breathing better now that she has improved her diet Had an acute exacerbation following a treatment for her myelodysplatic syndorme   Will need to discuss with Dr. Zoila Shutter, MD about further treatments.  2. ICD her ICD still has battery  life.    The plan is to upgrade her ICD when she needs a generator change.  3. Hypothyroidism -    4.   Anemia:   Further eval per her primary MD    [name], MD  03/26/2019 11:54 AM    [address],  [place], [place]  [address] Pager [phone] Phone: [phone]; Fax: [phone]

## 2019-03-26 NOTE — Telephone Encounter (Signed)
Margaretha Seeds, MD  P [facility] Triage Pool        Please call and offer patient Pulmonary appointment with me on 7/22 to review her CT scan.   CT scan is scheduled in am so will need pm appointment.   JE    lmtcb for pt.

## 2019-03-27 NOTE — Telephone Encounter (Signed)
noted 

## 2019-03-27 NOTE — Telephone Encounter (Signed)
Pt called back and I scheduled her follow-up appt for 7/23.  She had other appts in the p.m. on 7/22.  Just FYI.

## 2019-03-28 DIAGNOSIS — J15212 Pneumonia due to Methicillin resistant Staphylococcus aureus: Secondary | ICD-10-CM | POA: Diagnosis not present

## 2019-03-28 DIAGNOSIS — D469 Myelodysplastic syndrome, unspecified: Secondary | ICD-10-CM | POA: Diagnosis not present

## 2019-03-28 DIAGNOSIS — I5023 Acute on chronic systolic (congestive) heart failure: Secondary | ICD-10-CM | POA: Diagnosis not present

## 2019-03-28 DIAGNOSIS — J9601 Acute respiratory failure with hypoxia: Secondary | ICD-10-CM | POA: Diagnosis not present

## 2019-03-28 DIAGNOSIS — D630 Anemia in neoplastic disease: Secondary | ICD-10-CM | POA: Diagnosis not present

## 2019-03-28 DIAGNOSIS — I110 Hypertensive heart disease with heart failure: Secondary | ICD-10-CM | POA: Diagnosis not present

## 2019-04-03 ENCOUNTER — Telehealth: Payer: Self-pay | Admitting: *Deleted

## 2019-04-03 ENCOUNTER — Telehealth: Payer: Self-pay

## 2019-04-03 DIAGNOSIS — D469 Myelodysplastic syndrome, unspecified: Secondary | ICD-10-CM | POA: Diagnosis not present

## 2019-04-03 DIAGNOSIS — J15212 Pneumonia due to Methicillin resistant Staphylococcus aureus: Secondary | ICD-10-CM | POA: Diagnosis not present

## 2019-04-03 DIAGNOSIS — I110 Hypertensive heart disease with heart failure: Secondary | ICD-10-CM | POA: Diagnosis not present

## 2019-04-03 DIAGNOSIS — I5023 Acute on chronic systolic (congestive) heart failure: Secondary | ICD-10-CM | POA: Diagnosis not present

## 2019-04-03 DIAGNOSIS — J9601 Acute respiratory failure with hypoxia: Secondary | ICD-10-CM | POA: Diagnosis not present

## 2019-04-03 DIAGNOSIS — D630 Anemia in neoplastic disease: Secondary | ICD-10-CM | POA: Diagnosis not present

## 2019-04-03 NOTE — Telephone Encounter (Signed)
Please call pt and ask her to monitor BP at home. See if she is dizzy.

## 2019-04-03 NOTE — Telephone Encounter (Signed)
Don from occupational therapy called states patient's BP this morning was 92/55, patient is asymptomatic. He just wanted to let you know.    Call back number [phone]

## 2019-04-03 NOTE — Telephone Encounter (Signed)
Script Screening patients for COVID-19 and reviewing new operational procedures  Greeting - The reason I am calling is to share with you some new changes to our processes that are designed to help us keep everyone safe. Is now a good time to speak with you? Patient says "no' - ask them when you can call back and let them know it's important to do this prior to their appointment.  Patient says "yes" - Great, [name]  the first thing I need to do is ask you some screening Questions.  1. To the best of your knowledge, have you been in close contact with any one with a confirmed diagnosis of COVID 19? o No - proceed to next question  2. Have you had any one or more of the following: fever, chills, cough, shortness of breath or any flu-like symptoms? o No - proceed to next question  3. Have you been diagnosed with or have a previous diagnosis of COVID 19? o No - proceed to next question  4. I am going to go over a few other symptoms with you. Please let me know if you are experiencing any of the following: . Ear, nose or throat discomfort . A sore throat . Headache . Muscle pain . Diarrhea . Loss of taste or smell o No - proceed to next question  Thank you for answering these questions. Please know we will ask you these questions or similar questions when you arrive for your appointment and again it's how we are keeping everyone safe. Also, to keep you safe, please use the provided hand sanitizer when you enter the building. [name], we are asking everyone in the building to wear a mask because they help us prevent the spread of germs. Do you have a mask of your own, if not, we are happy to provide one for you. The last thing I want to go over with you is the no visitor guidelines. This means no one can attend the appointment with you unless you need physical assistance. I understand this may be different from your past appointments and I know this may be difficult but please know if  someone is driving you we are happy to call them for you once your appointment is over.  [INSERT SITE SPECIFIC CHECK IN PROCEDURES]  [name]  I've given you a lot of information, what questions do you have about what I've talked about today or your appointment tomorrow? 

## 2019-04-04 ENCOUNTER — Inpatient Hospital Stay (HOSPITAL_BASED_OUTPATIENT_CLINIC_OR_DEPARTMENT_OTHER): Payer: Medicare Other | Admitting: Internal Medicine

## 2019-04-04 ENCOUNTER — Other Ambulatory Visit: Payer: Self-pay | Admitting: *Deleted

## 2019-04-04 ENCOUNTER — Ambulatory Visit (INDEPENDENT_AMBULATORY_CARE_PROVIDER_SITE_OTHER)
Admission: RE | Admit: 2019-04-04 | Discharge: 2019-04-04 | Disposition: A | Discharge status: Home / Self Care | Payer: Medicare Other | Source: Ambulatory Visit | Attending: Pulmonary Disease | Admitting: Pulmonary Disease

## 2019-04-04 ENCOUNTER — Inpatient Hospital Stay: Payer: Medicare Other

## 2019-04-04 ENCOUNTER — Other Ambulatory Visit: Payer: Self-pay

## 2019-04-04 DIAGNOSIS — E611 Iron deficiency: Secondary | ICD-10-CM | POA: Diagnosis not present

## 2019-04-04 DIAGNOSIS — J849 Interstitial pulmonary disease, unspecified: Secondary | ICD-10-CM | POA: Diagnosis not present

## 2019-04-04 DIAGNOSIS — I2510 Atherosclerotic heart disease of native coronary artery without angina pectoris: Secondary | ICD-10-CM | POA: Diagnosis not present

## 2019-04-04 DIAGNOSIS — I10 Essential (primary) hypertension: Secondary | ICD-10-CM | POA: Diagnosis not present

## 2019-04-04 DIAGNOSIS — D469 Myelodysplastic syndrome, unspecified: Secondary | ICD-10-CM | POA: Diagnosis not present

## 2019-04-04 DIAGNOSIS — D4621 Refractory anemia with excess of blasts 1: Secondary | ICD-10-CM | POA: Diagnosis not present

## 2019-04-04 DIAGNOSIS — E039 Hypothyroidism, unspecified: Secondary | ICD-10-CM

## 2019-04-04 LAB — CBC WITH DIFFERENTIAL (CANCER CENTER ONLY)
Abs Immature Granulocytes: 0.03 10*3/uL (ref 0.00–0.07)
Basophils Absolute: 0 10*3/uL (ref 0.0–0.1)
Basophils Relative: 0 %
Eosinophils Absolute: 0 10*3/uL (ref 0.0–0.5)
Eosinophils Relative: 0 %
HCT: 22.3 % — ABNORMAL LOW (ref 36.0–46.0)
Hemoglobin: 7 g/dL — ABNORMAL LOW (ref 12.0–15.0)
Immature Granulocytes: 1 %
Lymphocytes Relative: 41 %
Lymphs Abs: 1.8 10*3/uL (ref 0.7–4.0)
MCH: 29.9 pg (ref 26.0–34.0)
MCHC: 31.4 g/dL (ref 30.0–36.0)
MCV: 95.3 fL (ref 80.0–100.0)
Monocytes Absolute: 0.3 10*3/uL (ref 0.1–1.0)
Monocytes Relative: 8 %
Neutro Abs: 2.2 10*3/uL (ref 1.7–7.7)
Neutrophils Relative %: 50 %
Platelet Count: 122 10*3/uL — ABNORMAL LOW (ref 150–400)
RBC: 2.34 MIL/uL — ABNORMAL LOW (ref 3.87–5.11)
RDW: 18.8 % — ABNORMAL HIGH (ref 11.5–15.5)
WBC Count: 4.4 10*3/uL (ref 4.0–10.5)
nRBC: 9.8 % — ABNORMAL HIGH (ref 0.0–0.2)

## 2019-04-04 LAB — CMP (CANCER CENTER ONLY)
ALT: 9 U/L (ref 0–44)
AST: 11 U/L — ABNORMAL LOW (ref 15–41)
Albumin: 3.7 g/dL (ref 3.5–5.0)
Alkaline Phosphatase: 58 U/L (ref 38–126)
Anion gap: 11 (ref 5–15)
BUN: 17 mg/dL (ref 8–23)
CO2: 29 mmol/L (ref 22–32)
Calcium: 9.6 mg/dL (ref 8.9–10.3)
Chloride: 103 mmol/L (ref 98–111)
Creatinine: 0.9 mg/dL (ref 0.44–1.00)
GFR, Est AFR Am: >60 mL/min (ref >60)
GFR, Estimated: >60 mL/min (ref >60)
Glucose, Bld: 142 mg/dL — ABNORMAL HIGH (ref 70–99)
Potassium: 3.5 mmol/L (ref 3.5–5.1)
Sodium: 143 mmol/L (ref 135–145)
Total Bilirubin: 1.8 mg/dL — ABNORMAL HIGH (ref 0.3–1.2)
Total Protein: 6.1 g/dL — ABNORMAL LOW (ref 6.5–8.1)

## 2019-04-04 LAB — SAMPLE TO BLOOD BANK

## 2019-04-04 LAB — LACTATE DEHYDROGENASE: LDH: 120 U/L (ref 98–192)

## 2019-04-04 LAB — PREPARE RBC (CROSSMATCH)

## 2019-04-04 NOTE — Progress Notes (Signed)
Diagnosis No diagnosis found.  Staging Cancer Staging No matching staging information was found for the patient.  Assessment and Plan:   1.  Myelodysplastic syndrome, Excess blasts.  73 year old previously followed by Dr. Audelia Hives.  Pt has past medical history significant for dyslipidemia; compensated systolic congestive heart failure; placement of a defibrillator in 2011; nonischemic cardiomyopathy; bilateral nonsurgical cataracts; hypothyroid disease; mitral regurgitation; elevated BMI; diverticular disease; gastroesophageal reflux disease with dysphagia; and a serrated sessile polyp (2) identified initially 3 years earlier. Her primary care physician is Dr. Tedra Senegal.  Her gastroenterologist is Dr. Zenovia Jarred.  Over the past 6 months, she was aware that she was anemic.   Review of her laboratory studies from May 01, 2015 show hemoglobin 11.6 hematocrit 33.3 WBC 3.8; platelets 307,000.  On December 05, 2015: A complete blood count showed hemoglobin 11.1 hematocrit 33.4 WBC 5.6; platelets 339,000.  On May 03, 2017: A complete blood count showed hemoglobin 10.9 hematocrit 33.9 MCV 103.7 MCH 33.3 RDW 17.4 WBC 5.0 with 47% neutrophils 43% lymphocytes 7% monocytes 2% eosinophils 1% basophil; platelets 349,000.  More recently, on May 12, 2018: A complete blood count showed hemoglobin 9.7 hematocrit 29.0 MCV 101.4 MCH 33.9 RDW 17.3 WBC 4.2 with 47% neutrophils 43% lymphocytes 7% monocytes 2% eosinophils 1% basophil; platelets 333,000.  On May 25, 2018: A complete blood count showed hemoglobin 9.6 hematocrit 28.8 MCV 101.4 MCH 33.8 RDW 17.9 WBC 3.8 with 39% neutrophils 51% lymphocytes 7% monocytes 2% eosinophils 1% basophil; platelets 338,000.  She  had in the past 2 colonoscopies.  In 2015 a Cologuard was positive.  A serrated sessile polyp was removed from the ascending colon at that time.  A follow-up colonoscopy one year later on February 25, 2015 revealed a second 5 mm sessile serrated polyp in  the descending colon. The prior polypectomy site in the ascending colon showed no residual polyp.  She denies any bright red blood per rectum or melena.  She has no significant change in bowel habits although her stools vary from constipation to loose to normal.  She denies abdominal pain or tenesmus.  She has had dyspepsia and dysphagia in spite of omeprazole. She denies odynophagia. Solid food seems to get stuck in her epigastrium.   Pt was treated with IV iron 09/22/2018 and 09/29/2018.    Labs done 11/02/2018 showed WBC 4 HB 8.7 plts 290,000.  Chemistries WNL with K+ 3.4 Cr 0.83 normal LFTs.  Ferritin improved at 289.   Labs done 11/24/2018 showed WBC 5.8 Hb 7.2 plts 264,000.  Pt was transfused 11/28/2018.    Labs done  12/04/2018 showed WBC 3.5 HB 8.3 plts 249,000.  Chemistries WNL with K+ 3.5 Cr 0.86 and normal LFTs.  Ferritin 408 B12 1081.    HB remained decreased at 8.3 despite IV iron and blood transfusion. She is on folic acid and oral X21 supplements. I recommended bone marrow aspirate and biopsy for further evaluation due to persistent macrocytosis and anemia to rule out MDS.     Bone marrow biopsy done 12/15/2018 reviewed and showed  Bone Marrow, Aspirate,Biopsy, and Clot, right iliac BONE MARROW: - HYPERCELLULAR MARROW WITH MULTILINEAGE DYSPLASIA, RING SIDEROBLASTS AND INCREASED BLASTS (5-9% BY CD34 IMMUNOHISTOCHEMISTRY) - SEE COMMENT PERIPHERAL BLOOD: - MACROCYTIC ANEMIA - SEE COMPLETE BLOOD COUNT Diagnosis Note The overall features in the marrow are consistent with a myelodysplastic syndrome with excess blasts-1 (MDS-EB-1); correlation with cytogenetics and FISH (MDS panel) is recommended.  Cytogenetics were normal.    FISH: NORMAL Probes  analyzed (5, 7, and 8)  NGS done 12/15/2018 showed CBL mutations which are associated with decreased survival in MDS, RUNX1 mutations that are associated with an unfavorable prognosis.   She had negative FLT3, IDH1 and IDH2 mutations.     Previously I discussed with her and her case manager via phone her diagnosis of MDS.  Based on IPSS scoring system she would be intermediate to high risk due to % of blasts (5-9%) and anemia.  Usually in higher risk pts allogeneic SCT offers the greatest potential for long-term disease control, but may cause substantial toxicity. Hypomethylating agents such as Vidaza and Dacogen are associated with less toxicity than either transplantation or intensive chemotherapy and can provide symptomatic improvement but do not offer the possibility of cure.  Based on her significant cardiomyopathy with ECHO done 07/17/2018 showing EF of 25-30%, she would not likely be a candidate for SCT.  ECHO done 01/28/2019 showed EF 20-25%.  She was presented options of therapy with Vidaza dosed at 75 mg/m2 per day for 5 days initially.  This may expand to 7 days pending tolerance of therapy.  Pt was planned for 4-6 months of therapy with repeat evaluation and option of maintenance therapy.  Vidaza is a option for pt not suitable for transplant and provides symptomatic improvement with tolerability of therapy and some survival benefit but does not offer cure. I have also discussed Dacogen as option in MDS.   Goals of therapy reviewed and are for disease control.    Pt was treated with Vidaza beginning 01/08/2019 with most recent dose on 01/12/2019.    She had lab monitoring and was treated with blood transfusions.    Pt was admitted on 01/22/2019 and was discharged on 02/08/2019 due to Acute hypoxic respiratory failure secondary to acute on chronic systolic congestive heart failure and acute lung injury: -- Initially treated as pneumonia with Rocephin and azithromycin. Pneumonia ruled out. COVID-19 negative. Respiratory virus panel was negative.  -- Treated for acute exacerbation of chronic systolic heart failure. Followed by heart failure team.  She was discharged to SNF.    Pt had blood work done 03/02/2019 that showed  WBC 4.9 HB 8.7 plts 133,000.  Labs done today 03/09/2019 reviewed and showed WBC 4.9 HB 7.8 plts 145,000.  Transfusion not recommended today due to HB 7.8.    She has felt better since discharge and does not desire to resume therapy currently.  She was wondering if dose modification would be possible and I discussed with her that usual recommendations are for 7 day course of therapy and she was initially recommended for 5 days which was a shorter recommended course of therapy.  I have discussed with her due to intermediate and high risk MDS there is concern for progression to an acute leukemia. Based on her prognostic score category median survival is usually 1-2 years with risk to AML evolution of 1-2 years.  Her poor cardiac function also likely plays a role in her lack of tolerance of therapy.    I had previously presented options of referral to tertiary center such as [facility] for evaluation.  Pt desires [facility] referral and will be sent there for further evaluation.  I have also spoken with her cardiologist Dr. Acie Fredrickson and pt is referred to Dr. Haroldine Laws also for cardiac evaluation.   Labs done today 04/04/2019 reviewed and showed WBC 4.4 HB 7 plts 122,000.  Chemistries showed K+ 3.5 Cr 0.90 and normal LFTs.  Pt is set up  for transfusion of 1 U PRBCs.  She will have repeat labs in 10-14 days for ongoing monitoring.    Pt desires to remain on observation and will continue to have labs monitored and transfusion as needed.   All questions answered and they expressed understanding of information presented.    2.  CHF and SOB.  Pt is not currently on oxygen. She reports breathing is improved since hospital discharge.  Pulse ox today is 98% on room air.  Previously, she had questions about need for home oxygen.  I discussed with her pulse ox would have to be at lower range to recommend home O2 currently.  I had previously presented options of referral to tertiary center such as [facility] for  evaluation.  Pt desires [facility] referral and will be sent there for further evaluation.  I have also spoken with her cardiologist Dr. Acie Fredrickson and pt is referred to Dr. Haroldine Laws for cardiac evaluation. Pt has defibrillator in place. Echo done 07/17/2018 showed EF 25-30%.  Pt had ECHO done 01/28/2019 that showed EF 20-25%.    3.  Anemia.  Due to MDS. HB today is 7.  She will be transfused with 1 U PRBCs.   Will continue to monitor counts.   4  Hypothyroidism.  Follow-up with PCP for monitoring.   5.  HTN.  BP is 107/44.  Follow-up with PCP.   6.  Health maintenance.  Pt had colonoscopy in 2016 that showed a polyp but no evidence of dysplasia.  EGD done 08/07/2018 showed single gastric polyp and evidence of gastritis.  Follow-up with GI as recommended.  Mammogram was 05/2018 and was negative.  Continue mammogram screenings as recommended.    25 minutes spent with more than 50% spent in review of records, counseling and coordination of care.    Interval History:  Historical data obtained from note dated 08/03/2018.  73 year old female previously followed by Dr. Audelia Hives.  Pt has history of serrated sessile polyp (2) identified initially 3 years earlier. Her primary care physician is Dr. Tedra Senegal.  Her gastroenterologist is Dr. Zenovia Jarred.  Over the past 6 months, she was aware that she was anemic.   Laboratory studies from May 01, 2015 show hemoglobin 11.6 hematocrit 33.3 WBC 3.8; platelets 307,000.  On December 05, 2015: A complete blood count showed hemoglobin 11.1 hematocrit 33.4 WBC 5.6; platelets 339,000.  On May 03, 2017: A complete blood count showed hemoglobin 10.9 hematocrit 33.9 MCV 103.7 MCH 33.3 RDW 17.4 WBC 5.0 with 47% neutrophils 43% lymphocytes 7% monocytes 2% eosinophils 1% basophil; platelets 349,000.  More recently, on May 12, 2018: A complete blood count showed hemoglobin 9.7 hematocrit 29.0 MCV 101.4 MCH 33.9 RDW 17.3 WBC 4.2 with 47% neutrophils 43% lymphocytes 7% monocytes 2%  eosinophils 1% basophil; platelets 333,000.  On May 25, 2018: A complete blood count showed hemoglobin 9.6 hematocrit 28.8 MCV 101.4 MCH 33.8 RDW 17.9 WBC 3.8 with 39% neutrophils 51% lymphocytes 7% monocytes 2% eosinophils 1% basophil; platelets 338,000.  She has had 2 colonoscopies.  In 2015 a Cologuard was positive.  A serrated sessile polyp was removed from the ascending colon at that time.  A follow-up colonoscopy one year later on February 25, 2015 revealed a second 5 mm sessile serrated polyp in the descending colon. The prior polypectomy site in the ascending colon showed no residual polyp.  She denies any bright red blood per rectum or melena.  She has no significant change in bowel habits although her  stools vary from constipation to loose to normal.  She denies abdominal pain or tenesmus.  She has had dyspepsia and dysphagia in spite of omeprazole. She denies odynophagia. Solid food seems to get stuck in her epigastrium.    Current Status:  Pt is seen today for follow-up.  She is here to go over labs.  She is scheduled to see pulmonary tomorrow.  She will see cardiology next month.    Oncology History  MDS (myelodysplastic syndrome) ([place])  12/27/2018 Initial Diagnosis   MDS (myelodysplastic syndrome) ([place])   01/08/2019 -  Chemotherapy   The patient had azaCITIDine (VIDAZA) chemo injection 142.5 mg, 75 mg/m2 = 142.5 mg, Subcutaneous,  Once, 1 of 4 cycles Administration: 142.5 mg (01/08/2019), 142.5 mg (01/09/2019), 142.5 mg (01/10/2019), 142.5 mg (01/12/2019)  for chemotherapy treatment.       Problem List Patient Active Problem List   Diagnosis Date Noted  . Acute systolic heart failure ([place]) [I50.21]   . Acute on chronic systolic CHF (congestive heart failure), NYHA class 4 (HCC) [I50.23]   . Dyspnea [R06.00]   . Congestive dilated cardiomyopathy ([place]) [I42.0]   . Respiratory failure ([place]) [J96.90] 01/23/2019  . Multifocal pneumonia [J18.9] 01/22/2019  . Acute respiratory failure  with hypoxia ([place]) [J96.01]   . MDS (myelodysplastic syndrome) ([place]) [D46.9] 12/27/2018  . Iron deficiency [E61.1] 09/21/2018  . Symptomatic anemia [D64.9] 08/03/2018  . Impaired glucose tolerance [R73.02] 06/21/2017  . Cough variant asthma  vs UACS from ACEi  [J45.991] 12/31/2016  . Essential hypertension [I10] 12/31/2016  . Morbid obesity due to excess calories ([place]) [E66.01] 12/31/2016  . History of colonic polyps [Z86.010]   . Benign neoplasm of descending colon [D12.4]   . Diverticulosis of colon without hemorrhage [K57.30] 01/28/2014  . 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 Nonspecific abnormal finding in stool contents [R19.5] 11/29/2013  . Colon cancer screening [Z12.11] 11/29/2013  . History of depression [Z86.59] 08/11/2012  . Chronic systolic CHF (congestive heart failure) ([place]) [I50.22]   . Systolic CHF, chronic ([place]) [I50.22]   . LBBB (left bundle branch block) [I44.7]   . Automatic implantable cardioverter-defibrillator in situ [Z95.810]   . Hypothyroidism [E03.9]   . GERD (gastroesophageal reflux disease) [K21.9]   . Dyslipidemia [E78.5]   . Mitral regurgitation [I34.0]   . Overweight [E66.3]   . Ejection fraction < 50% [R94.30]     Past Medical History Past Medical History:  Diagnosis Date  . Automatic implantable cardioverter-defibrillator in situ    Dr. Beckie Salts follows-next visit- [phone]  . Cataract   . Diverticulosis   . Dyslipidemia   . Ejection fraction < 50%    EF 25-30%, September, 2010  . GERD (gastroesophageal reflux disease)   . Hypothyroidism   . ICD (implantable cardiac defibrillator) in place    10/2009; Dr. Lovena Le  . Iron deficiency 09/21/2018  . LBBB (left bundle branch block)    old  . Macrocytic anemia   . MDS (myelodysplastic syndrome) ([place]) 12/27/2018  . Mitral regurgitation    Mild, echo, September, 2010  . Nonischemic cardiomyopathy ([place])    Normal coronary arteries, catheterization, September, 2009  . Overweight(278.02)    . Serrated polyp of colon   . Systolic CHF, chronic (HCC)    no recent problems    Past Surgical History Past Surgical History:  Procedure Laterality Date  . BIOPSY  08/07/2018   Procedure: BIOPSY;  Surgeon: Jerene Bears, MD;  Location: [facility];  Service: Gastroenterology;;  . CARDIAC  CATHETERIZATION     '09 last  . [place]    . COLONOSCOPY N/A 11/29/2013   Procedure: COLONOSCOPY;  Surgeon: Jerene Bears, MD;  Location: [facility];  Service: Gastroenterology;  Laterality: N/A;  . COLONOSCOPY N/A 02/25/2015   Procedure: COLONOSCOPY;  Surgeon: Jerene Bears, MD;  Location: [facility];  Service: Gastroenterology;  Laterality: N/A;  . ESOPHAGOGASTRODUODENOSCOPY (EGD) WITH PROPOFOL N/A 08/07/2018   Procedure: ESOPHAGOGASTRODUODENOSCOPY (EGD) WITH PROPOFOL;  Surgeon: Jerene Bears, MD;  Location: [facility];  Service: Gastroenterology;  Laterality: N/A;  . EYE SURGERY Left    x 3-"droopy eyelid"  . heart catherization    . TONSILLECTOMY  1952    Family History Family History  Problem Relation Age of Onset  . Heart attack Mother   . Heart disease Mother   . Hypertension Mother   . Lung cancer Father   . Pancreatic cancer Father   . Cancer Father   . Stroke Paternal Grandfather   . Colon cancer Neg Hx   . Diabetes Neg Hx   . Kidney disease Neg Hx   . Liver disease Neg Hx      Social History  reports that she has never smoked. She has never used smokeless tobacco. She reports current alcohol use. She reports that she does not use drugs.  Medications  Current Outpatient Medications:  .  albuterol (PROVENTIL HFA;VENTOLIN HFA) 108 (90 Base) MCG/ACT inhaler, Inhale 2 puffs into the lungs every 6 (six) hours as needed for wheezing or shortness of breath., Disp: 1 Inhaler, Rfl: prn .  carvedilol (COREG) 25 MG tablet, Take 1 tablet (25 mg total) by mouth 2 (two) times daily., Disp: 180 tablet, Rfl: 3 .  Cyanocobalamin (VITAMIN B-12 PO), Take 1 tablet  by mouth daily. gummies, Disp: , Rfl:  .  folic acid (FOLVITE) 1 MG tablet, TAKE 1 TABLET BY MOUTH EVERY DAY, Disp: 90 tablet, Rfl: 0 .  furosemide (LASIX) 40 MG tablet, Take 1 tablet (40 mg total) by mouth daily., Disp: 90 tablet, Rfl: 2 .  glucose blood (ACCU-CHEK AVIVA) test strip, Check daily.  For Diabetes 250.00, Disp: 100 each, Rfl: 12 .  ipratropium-albuterol (DUONEB) 0.5-2.5 (3) MG/3ML SOLN, Take 3 mLs by nebulization every 4 (four) hours as needed (shortness of breath or wheezing)., Disp: , Rfl:  .  Lancets MISC, Check glucose daily.  Diabetes 250.00, Disp: 100 each, Rfl: 11 .  levothyroxine (SYNTHROID) 150 MCG tablet, Take 1 tablet (150 mcg total) by mouth daily before breakfast., Disp: 90 tablet, Rfl: 0 .  pantoprazole (PROTONIX) 40 MG tablet, Take 1 tablet (40 mg total) by mouth daily., Disp: 90 tablet, Rfl: 3 .  prochlorperazine (COMPAZINE) 10 MG tablet, Take 1 tablet (10 mg total) by mouth every 6 (six) hours as needed for nausea or vomiting., Disp: 30 tablet, Rfl: 1 .  Propylene Glycol (SYSTANE BALANCE) 0.6 % SOLN, Place 1 drop into both eyes daily as needed (for dry eyes)., Disp: , Rfl:  .  sacubitril-valsartan (ENTRESTO) 24-26 MG, Take 1 tablet by mouth 2 (two) times daily., Disp: 60 tablet, Rfl: 2 .  spironolactone (ALDACTONE) 25 MG tablet, Take 0.5 tablets (12.5 mg total) by mouth daily., Disp: 45 tablet, Rfl: 3  Allergies Patient has no known allergies.  Review of Systems Review of Systems - Oncology ROS negative   Physical Exam  Vitals Wt Readings from Last 3 Encounters:  04/04/19 177 lb 14.4 oz (80.7 kg)  03/26/19 177 lb 12.8 oz (80.6  kg)  03/09/19 180 lb 12.8 oz (82 kg)   Temp Readings from Last 3 Encounters:  04/04/19 98.7 F (37.1 C) (Oral)  03/16/19 98.1 F (36.7 C) (Oral)  03/09/19 98.7 F (37.1 C) (Oral)   BP Readings from Last 3 Encounters:  04/04/19 (!) 107/44  03/26/19 (!) 122/58  03/16/19 (!) 108/51   Pulse Readings from Last 3 Encounters:   04/04/19 88  03/26/19 76  03/16/19 81   Constitutional: Well-developed, well-nourished, and in no distress.   HENT: Head: Normocephalic and atraumatic.  Mouth/Throat: No oropharyngeal exudate. Mucosa moist. Eyes: Pupils are equal, round, and reactive to light. Conjunctivae are normal. No scleral icterus.  Neck: Normal range of motion. Neck supple. No JVD present.  Cardiovascular: Normal rate, regular rhythm and normal heart sounds.  Exam reveals no gallop and no friction rub.   No murmur heard. Pulmonary/Chest: Effort normal and breath sounds normal. No respiratory distress. No wheezes.No rales.  Abdominal: Soft. Bowel sounds are normal. No distension. There is no tenderness. There is no guarding.  Musculoskeletal: No edema or tenderness.  Lymphadenopathy: No cervical, axillary or supraclavicular adenopathy.  Neurological: Alert and oriented to person, place, and time. No cranial nerve deficit.  Skin: Skin is warm and dry. No rash noted. No erythema. No pallor. Chronic skin changes with minor bruises noted.   Psychiatric: Affect and judgment normal.   Labs Appointment on 04/04/2019  Component Date Value Ref Range Status  . WBC Count 04/04/2019 4.4  4.0 - 10.5 K/uL Final  . RBC 04/04/2019 2.34* 3.87 - 5.11 MIL/uL Final  . Hemoglobin 04/04/2019 7.0* 12.0 - 15.0 g/dL Final  . HCT 04/04/2019 22.3* 36.0 - 46.0 % Final  . MCV 04/04/2019 95.3  80.0 - 100.0 fL Final  . MCH 04/04/2019 29.9  26.0 - 34.0 pg Final  . MCHC 04/04/2019 31.4  30.0 - 36.0 g/dL Final  . RDW 04/04/2019 18.8* 11.5 - 15.5 % Final  . Platelet Count 04/04/2019 122* 150 - 400 K/uL Final  . nRBC 04/04/2019 9.8* 0.0 - 0.2 % Final  . Neutrophils Relative % 04/04/2019 50  % Final  . Neutro Abs 04/04/2019 2.2  1.7 - 7.7 K/uL Final  . Lymphocytes Relative 04/04/2019 41  % Final  . Lymphs Abs 04/04/2019 1.8  0.7 - 4.0 K/uL Final  . Monocytes Relative 04/04/2019 8  % Final  . Monocytes Absolute 04/04/2019 0.3  0.1 - 1.0 K/uL  Final  . Eosinophils Relative 04/04/2019 0  % Final  . Eosinophils Absolute 04/04/2019 0.0  0.0 - 0.5 K/uL Final  . Basophils Relative 04/04/2019 0  % Final  . Basophils Absolute 04/04/2019 0.0  0.0 - 0.1 K/uL Final  . Smear Review 04/04/2019 ATYPICAL LYMPHOCYTES SEEN   Final  . Immature Granulocytes 04/04/2019 1  % Final  . Abs Immature Granulocytes 04/04/2019 0.03  0.00 - 0.07 K/uL Final   Performed at [facility], [place] [address], [place], [place] [address]  . Sodium 04/04/2019 143  135 - 145 mmol/L Final  . Potassium 04/04/2019 3.5  3.5 - 5.1 mmol/L Final  . Chloride 04/04/2019 103  98 - 111 mmol/L Final  . CO2 04/04/2019 29  22 - 32 mmol/L Final  . Glucose, Bld 04/04/2019 142* 70 - 99 mg/dL Final  . BUN 04/04/2019 17  8 - 23 mg/dL Final  . Creatinine 04/04/2019 0.90  0.44 - 1.00 mg/dL Final  . Calcium 04/04/2019 9.6  8.9 - 10.3 mg/dL Final  .  Total Protein 04/04/2019 6.1* 6.5 - 8.1 g/dL Final  . Albumin 04/04/2019 3.7  3.5 - 5.0 g/dL Final  . AST 04/04/2019 11* 15 - 41 U/L Final  . ALT 04/04/2019 9  0 - 44 U/L Final  . Alkaline Phosphatase 04/04/2019 58  38 - 126 U/L Final  . Total Bilirubin 04/04/2019 1.8* 0.3 - 1.2 mg/dL Final  . GFR, Est Non Af Am 04/04/2019 >60  >60 mL/min Final  . GFR, Est AFR Am 04/04/2019 >60  >60 mL/min Final  . Anion gap 04/04/2019 11  5 - 15 Final   Performed at [facility], [place] [address] [place], [place] [address]  . LDH 04/04/2019 120  98 - 192 U/L Final   Performed at [facility], [place] [address] [place], [place] [address]  . Blood Bank Specimen 04/04/2019 SAMPLE AVAILABLE FOR TESTING   Final  . Sample Expiration 04/04/2019    Final                   Value:04/07/2019,2359 Performed at [facility], [place] [address], [place], [place] [address]      Pathology No orders of the defined types were placed in this encounter.      Zoila Shutter MD

## 2019-04-05 ENCOUNTER — Encounter: Payer: Self-pay | Admitting: Pulmonary Disease

## 2019-04-05 ENCOUNTER — Other Ambulatory Visit: Payer: Self-pay | Admitting: Internal Medicine

## 2019-04-05 ENCOUNTER — Ambulatory Visit (INDEPENDENT_AMBULATORY_CARE_PROVIDER_SITE_OTHER): Payer: Medicare Other | Admitting: Pulmonary Disease

## 2019-04-05 ENCOUNTER — Inpatient Hospital Stay: Payer: Medicare Other

## 2019-04-05 ENCOUNTER — Other Ambulatory Visit: Payer: Self-pay

## 2019-04-05 ENCOUNTER — Other Ambulatory Visit: Payer: Self-pay | Admitting: *Deleted

## 2019-04-05 VITALS — BP 100/52 | HR 83 | Temp 98.0°F | Ht 59.0 in | Wt 177.6 lb

## 2019-04-05 DIAGNOSIS — D469 Myelodysplastic syndrome, unspecified: Secondary | ICD-10-CM

## 2019-04-05 DIAGNOSIS — D649 Anemia, unspecified: Secondary | ICD-10-CM

## 2019-04-05 DIAGNOSIS — R9389 Abnormal findings on diagnostic imaging of other specified body structures: Secondary | ICD-10-CM | POA: Diagnosis not present

## 2019-04-05 DIAGNOSIS — E611 Iron deficiency: Secondary | ICD-10-CM | POA: Diagnosis not present

## 2019-04-05 MED ORDER — FUROSEMIDE 10 MG/ML IJ SOLN
INTRAMUSCULAR | Status: AC
  Filled 2019-04-05: qty 2

## 2019-04-05 MED ORDER — ACETAMINOPHEN 325 MG PO TABS
650.0000 mg | ORAL_TABLET | Freq: Once | ORAL | Status: AC
  Administered 2019-04-05: 650 mg via ORAL

## 2019-04-05 MED ORDER — DIPHENHYDRAMINE HCL 25 MG PO CAPS
25.0000 mg | ORAL_CAPSULE | Freq: Once | ORAL | Status: AC
  Administered 2019-04-05: 13:00:00 25 mg via ORAL

## 2019-04-05 MED ORDER — FUROSEMIDE 10 MG/ML IJ SOLN: 40.0000 mg | Freq: Once | INTRAMUSCULAR | Status: DC

## 2019-04-05 MED ORDER — ACETAMINOPHEN 325 MG PO TABS
ORAL_TABLET | ORAL | Status: AC
  Filled 2019-04-05: qty 2

## 2019-04-05 MED ORDER — SODIUM CHLORIDE 0.9% IV SOLUTION
250.0000 mL | Freq: Once | INTRAVENOUS | Status: AC
  Administered 2019-04-05: 250 mL via INTRAVENOUS
  Filled 2019-04-05: qty 250

## 2019-04-05 MED ORDER — DIPHENHYDRAMINE HCL 25 MG PO CAPS
ORAL_CAPSULE | ORAL | Status: AC
  Filled 2019-04-05: qty 1

## 2019-04-05 MED ORDER — FUROSEMIDE 10 MG/ML IJ SOLN
20.0000 mg | Freq: Once | INTRAMUSCULAR | Status: AC
  Administered 2019-04-05: 20 mg via INTRAMUSCULAR

## 2019-04-05 NOTE — Progress Notes (Addendum)
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  She was admitted in May 2020 for acute hypoxemic respiratory failure secondary to PNA/heart failure. She was diuresed and started on steroids. Seen by Pulmonary Consult team while inpatient for abnormal chest imaging.  Since discharge, she feels improved compared to when she was in the hospital however reports less stamina compared to prior. With activity, she feels short of breath, especially after walking >200 ft. She has a walker that she only uses during outings. She reports resting oxygen ranging from 93-96%. But will drop to the low 90s. She has an albuterol inhaler that is old and has not used it for some time. Denies dizziness, headaches, blurry vision, chest pain. Reports reflux.   Social History: Never smoker  Environmental exposures: None  I have personally reviewed patient's past medical/family/social history, allergies, current medications.  Past Medical History:  Diagnosis Date   Automatic implantable cardioverter-defibrillator in situ    Dr. Beckie Salts follows-next visit- [phone]   Cataract    Diverticulosis    Dyslipidemia    Ejection fraction < 50%    EF 25-30%, September, 2010   GERD (gastroesophageal reflux disease)    Hypothyroidism    ICD (implantable cardiac defibrillator) in place    10/2009; Dr. Lovena Le   Iron deficiency 09/21/2018   LBBB (left bundle branch block)    old   Macrocytic anemia    MDS (myelodysplastic syndrome) ([place]) 12/27/2018   Mitral regurgitation    Mild, echo, September, 2010   Nonischemic cardiomyopathy [facility])    Normal coronary arteries, catheterization,  September, 2009   Overweight(278.02)    Serrated polyp of colon    Systolic CHF, chronic ([place])    no recent problems     Family History  Problem Relation Age of Onset   Heart attack Mother    Heart disease Mother    Hypertension Mother    Lung cancer Father    Pancreatic cancer Father    Cancer Father    Stroke Paternal Grandfather    Colon cancer Neg Hx    Diabetes Neg Hx    Kidney disease Neg Hx    Liver disease Neg Hx      Social History   Occupational History   Not on file  Tobacco Use   Smoking status: Never Smoker   Smokeless tobacco: Never Used  Substance and Sexual Activity   Alcohol use: Yes    Comment: 2-4 beers per week   Drug use: No   Sexual activity: Never    Birth control/protection: Abstinence    No Known Allergies   Outpatient Medications Prior to Visit  Medication Sig Dispense Refill   albuterol (PROVENTIL HFA;VENTOLIN HFA) 108 (90 Base) MCG/ACT inhaler Inhale 2 puffs into the lungs every 6 (six) hours as needed for wheezing or shortness of breath. 1 Inhaler prn   carvedilol (COREG) 25 MG tablet Take 1 tablet (25 mg total) by mouth 2 (two) times daily. 180 tablet 3   Cyanocobalamin (VITAMIN B-12 PO) Take 1 tablet by mouth daily. gummies     folic acid (FOLVITE) 1 MG tablet TAKE 1 TABLET BY MOUTH EVERY  DAY 90 tablet 0   furosemide (LASIX) 40 MG tablet Take 1 tablet (40 mg total) by mouth daily. 90 tablet 2   glucose blood (ACCU-CHEK AVIVA) test strip Check daily.  For Diabetes 250.00 100 each 12   ipratropium-albuterol (DUONEB) 0.5-2.5 (3) MG/3ML SOLN Take 3 mLs by nebulization every 4 (four) hours as needed (shortness of breath or wheezing).     Lancets MISC Check glucose daily.  Diabetes 250.00 100 each 11   levothyroxine (SYNTHROID) 150 MCG tablet Take 1 tablet (150 mcg total) by mouth daily before breakfast. 90 tablet 0   pantoprazole (PROTONIX) 40 MG tablet Take 1 tablet (40 mg total) by mouth daily. 90 tablet 3    prochlorperazine (COMPAZINE) 10 MG tablet Take 1 tablet (10 mg total) by mouth every 6 (six) hours as needed for nausea or vomiting. 30 tablet 1   Propylene Glycol (SYSTANE BALANCE) 0.6 % SOLN Place 1 drop into both eyes daily as needed (for dry eyes).     sacubitril-valsartan (ENTRESTO) 24-26 MG Take 1 tablet by mouth 2 (two) times daily. 60 tablet 2   spironolactone (ALDACTONE) 25 MG tablet Take 0.5 tablets (12.5 mg total) by mouth daily. 45 tablet 3   No facility-administered medications prior to visit.     Review of Systems  Constitutional: Positive for malaise/fatigue. Negative for chills, diaphoresis, fever and weight loss.  HENT: Negative for congestion, ear pain and sore throat.   Respiratory: Positive for shortness of breath. Negative for cough, hemoptysis, sputum production and wheezing.   Cardiovascular: Negative for chest pain, palpitations and leg swelling.  Gastrointestinal: Negative for abdominal pain, heartburn and nausea.  Genitourinary: Negative for frequency.  Musculoskeletal: Negative for joint pain and myalgias.  Skin: Negative for itching and rash.  Neurological: Negative for dizziness, weakness and headaches.  Endo/Heme/Allergies: Does not bruise/bleed easily.  Psychiatric/Behavioral: Negative for depression. The patient is not nervous/anxious.      Objective:   Vitals:   04/05/19 0949 04/05/19 0950  BP:  (!) 100/52  Pulse:  83  Temp: 98 F (36.7 C)   TempSrc: Oral   SpO2:  94%  Weight: 177 lb 9.6 oz (80.6 kg)   Height: 4\' 11"  (1.499 m)    SpO2: 94 % O2 Device: None (Room air)  Physical Exam: General: Elderly, well-appearing, no acute distress HENT: Capac, AT Eyes: EOMI, no scleral icterus Respiratory: Clear to auscultation bilaterally.  No crackles, wheezing or rales Cardiovascular: RRR, -M/R/G, no JVD GI: BS+, soft, nontender Extremities:-Edema,-tenderness Neuro: AAO x4, CNII-XII grossly intact Skin: Intact, no rashes or bruising Psych: Normal  mood, normal affect  Data Reviewed:  Imaging: HRCT 04/04/19 - Interval resolution of diffuse bilateral airspace disease. No evidence of fibrosis. Mild mosaicism  PFT: 12/31/16 FVC 1.6 (66%) FEV1 1.3 (70%) Ratio 80  Interpretation: Reduced FVC and FEV1. No evidence of obstructive disease.  Labs: CBC and CMP 04/04/19 reviewed. Hg 7.0 stable. CO2 normalized to 29 compared to 41 on 5/27. Likely represents improved contraction alkalosis in setting of diuresis as an inpatient  Additional Testing: Ambulatory O2 incomplete due to patient fatigue after one lap. Saturations remained >95%. Further testing deferred  Imaging, labs and tests noted above have been reviewed independently by me.    Assessment & Plan:  Abnormal CT - resolved Dyspnea  Discussion: 73 year old female with MDS, transfusion dependent anemia and NICM who presents for follow-up of abnormal CT. Reviewed prior and recent CT imaging with patient. Resolution of chest imaging findings likely represent  infection that has been treated. Her current dyspnea is likely related to deconditioning. Per patient, her hypoxemia has resolved and on ambulatory walk test, although incomplete, it did not demonstrate desaturation. Addressed questions.  No further imaging indicated Albuterol prescribed for shortness of breath and wheezing as needed Encouraged regular aerobic activity Continue oxygen if saturation <90% Follow-up as needed  No orders of the defined types were placed in this encounter. No orders of the defined types were placed in this encounter.  Return if symptoms worsen or fail to improve.   Greater than 50% of this patient 25-minute office visit was spent face-to-face in counseling with the patient/family. We discussed medical diagnosis and treatment plan as noted.  [place], MD [facility] 04/05/2019 8:54 PM  Office Number [phone]

## 2019-04-05 NOTE — Progress Notes (Signed)
Pt requesting 40mg  of Lasix IV after blood transfusion (states she has always received it because she gets SOB on the way out to her car). BP has been low since arrival and even after unit of PRBC, still only 104/48. Called Dr. Walden Field for instruction who stated that as long as pt was not SOB or in any signs of respiratory distress, she would prefer not to give pt the Lasix for further drop in BP. Updated pt on Dr. Walden Field' assessment. Pt stated she did not feel SOB or like she was struggling to breathe, but she did have some heaviness to her chest. Called Dr. Walden Field back with update and received orders for 20mg  of Lasix IV, and then to monitor pt for 30 min afterwards, and recheck vitals. As long as BP did not dramatically lower and pt felt fine, then OK to D/C pt home. Updated pt and Ginna Tucker-RPh on plan. Both in agreement. Will continue to monitor  30 min after administering IV Lasix pt's BP was 123/40.  Pt was able to ambulate to the bathroom without assistance. Pt denied any lightheadedness, dizziness, or SOB, and stated that the chest heaviness from earlier had resolved. Instructed pt to call clinic with any symptoms/concerns if they arise once she's home; verbalized understanding and agreement. IV removed intact and pt ambulated out of clinic without incident.

## 2019-04-05 NOTE — Patient Instructions (Addendum)
Your CT Scan looks good! No evidence of any lung issues that we saw before.  We will prescribe you albuterol for shortness and wheezing as needed  Continue regular activity as tolerated  Wear oxygen if your oxygen levels drop below 90%

## 2019-04-05 NOTE — Patient Instructions (Signed)
Blood Transfusion, Adult, Care After This sheet gives you information about how to care for yourself after your procedure. Your doctor may also give you more specific instructions. If you have problems or questions, contact your doctor. Follow these instructions at home:   Take over-the-counter and prescription medicines only as told by your doctor.  Go back to your normal activities as told by your doctor.  Follow instructions from your doctor about how to take care of the area where an IV tube was put into your vein (insertion site). Make sure you: ? Wash your hands with soap and water before you change your bandage (dressing). If there is no soap and water, use hand sanitizer. ? Change your bandage as told by your doctor.  Check your IV insertion site every day for signs of infection. Check for: ? More redness, swelling, or pain. ? More fluid or blood. ? Warmth. ? Pus or a bad smell. Contact a doctor if:  You have more redness, swelling, or pain around the IV insertion site.  You have more fluid or blood coming from the IV insertion site.  Your IV insertion site feels warm to the touch.  You have pus or a bad smell coming from the IV insertion site.  Your pee (urine) turns pink, red, or brown.  You feel weak after doing your normal activities. Get help right away if:  You have signs of a serious allergic or body defense (immune) system reaction, including: ? Itchiness. ? Hives. ? Trouble breathing. ? Anxiety. ? Pain in your chest or lower back. ? Fever, flushing, and chills. ? Fast pulse. ? Rash. ? Watery poop (diarrhea). ? Throwing up (vomiting). ? Dark pee. ? Serious headache. ? Dizziness. ? Stiff neck. ? Yellow color in your face or the white parts of your eyes (jaundice). Summary  After a blood transfusion, return to your normal activities as told by your doctor.  Every day, check for signs of infection where the IV tube was put into your vein.  Some  signs of infection are warm skin, more redness and pain, more fluid or blood, and pus or a bad smell where the needle went in.  Contact your doctor if you feel weak or have any unusual symptoms. This information is not intended to replace advice given to you by your health care provider. Make sure you discuss any questions you have with your health care provider. Document Released: 09/20/2014 Document Revised: 01/04/2018 Document Reviewed: 04/23/2016 Elsevier Patient Education  2020 Elsevier Inc.  Coronavirus (COVID-19) Are you at risk?  Are you at risk for the Coronavirus (COVID-19)?  To be considered HIGH RISK for Coronavirus (COVID-19), you have to meet the following criteria:  . Traveled to [place], [place], [place], [place] or [place]; or in the [place] to [place], [place], [place], or [place]; and have fever, cough, and shortness of breath within the last 2 weeks of travel OR . Been in close contact with a person diagnosed with COVID-19 within the last 2 weeks and have fever, cough, and shortness of breath . IF YOU DO NOT MEET THESE CRITERIA, YOU ARE CONSIDERED LOW RISK FOR COVID-19.  What to do if you are HIGH RISK for COVID-19?  . If you are having a medical emergency, call 911. . Seek medical care right away. Before you go to a doctor's office, urgent care or emergency department, call ahead and tell them about your recent travel, contact with someone diagnosed with COVID-19, and   your symptoms. You should receive instructions from your physician's office regarding next steps of care.  . When you arrive at healthcare provider, tell the healthcare staff immediately you have returned from visiting [place], [place], [place], [place] or [place]; or traveled in the [place] to [place], [place], [place], or [place]; in the last two weeks or you have been in close contact with a person diagnosed with COVID-19 in the last 2 weeks.   . Tell the health care staff about  your symptoms: fever, cough and shortness of breath. . After you have been seen by a medical provider, you will be either: o Tested for (COVID-19) and discharged home on quarantine except to seek medical care if symptoms worsen, and asked to  - Stay home and avoid contact with others until you get your results (4-5 days)  - Avoid travel on public transportation if possible (such as bus, train, or airplane) or o Sent to the Emergency Department by EMS for evaluation, COVID-19 testing, and possible admission depending on your condition and test results.  What to do if you are LOW RISK for COVID-19?  Reduce your risk of any infection by using the same precautions used for avoiding the common cold or flu:  . Wash your hands often with soap and warm water for at least 20 seconds.  If soap and water are not readily available, use an alcohol-based hand sanitizer with at least 60% alcohol.  . If coughing or sneezing, cover your mouth and nose by coughing or sneezing into the elbow areas of your shirt or coat, into a tissue or into your sleeve (not your hands). . Avoid shaking hands with others and consider head nods or verbal greetings only. . Avoid touching your eyes, nose, or mouth with unwashed hands.  . Avoid close contact with people who are sick. . Avoid places or events with large numbers of people in one location, like concerts or sporting events. . Carefully consider travel plans you have or are making. . If you are planning any travel outside or inside the [place], visit the CDC's Travelers' Health webpage for the latest health notices. . If you have some symptoms but not all symptoms, continue to monitor at home and seek medical attention if your symptoms worsen. . If you are having a medical emergency, call 911.   ADDITIONAL HEALTHCARE OPTIONS FOR PATIENTS  [facility] / e-Visit: [email or web address]         [facility]:  [phone]  [facility]: [phone]                   [facility]: [phone]   

## 2019-04-06 DIAGNOSIS — J9601 Acute respiratory failure with hypoxia: Secondary | ICD-10-CM | POA: Diagnosis not present

## 2019-04-06 DIAGNOSIS — J15212 Pneumonia due to Methicillin resistant Staphylococcus aureus: Secondary | ICD-10-CM | POA: Diagnosis not present

## 2019-04-06 DIAGNOSIS — D630 Anemia in neoplastic disease: Secondary | ICD-10-CM | POA: Diagnosis not present

## 2019-04-06 DIAGNOSIS — I5023 Acute on chronic systolic (congestive) heart failure: Secondary | ICD-10-CM | POA: Diagnosis not present

## 2019-04-06 DIAGNOSIS — D469 Myelodysplastic syndrome, unspecified: Secondary | ICD-10-CM | POA: Diagnosis not present

## 2019-04-06 DIAGNOSIS — I110 Hypertensive heart disease with heart failure: Secondary | ICD-10-CM | POA: Diagnosis not present

## 2019-04-06 LAB — TYPE AND SCREEN
ABO/RH(D): O POS
Antibody Screen: NEGATIVE
Unit division: 0

## 2019-04-06 LAB — BPAM RBC
Blood Product Expiration Date: 202008182359
ISSUE DATE / TIME: 202007231307
Unit Type and Rh: 5100

## 2019-04-07 LAB — TYPE AND SCREEN
ABO/RH(D): O POS
Antibody Screen: NEGATIVE
Unit division: 0

## 2019-04-07 LAB — BPAM RBC
Blood Product Expiration Date: 202008182359
ISSUE DATE / TIME: 202007231307
Unit Type and Rh: 5100

## 2019-04-09 DIAGNOSIS — I5023 Acute on chronic systolic (congestive) heart failure: Secondary | ICD-10-CM | POA: Diagnosis not present

## 2019-04-09 DIAGNOSIS — I110 Hypertensive heart disease with heart failure: Secondary | ICD-10-CM | POA: Diagnosis not present

## 2019-04-09 DIAGNOSIS — D469 Myelodysplastic syndrome, unspecified: Secondary | ICD-10-CM | POA: Diagnosis not present

## 2019-04-09 DIAGNOSIS — J9601 Acute respiratory failure with hypoxia: Secondary | ICD-10-CM | POA: Diagnosis not present

## 2019-04-09 DIAGNOSIS — D630 Anemia in neoplastic disease: Secondary | ICD-10-CM | POA: Diagnosis not present

## 2019-04-09 DIAGNOSIS — J15212 Pneumonia due to Methicillin resistant Staphylococcus aureus: Secondary | ICD-10-CM | POA: Diagnosis not present

## 2019-04-09 NOTE — Telephone Encounter (Signed)
Patient contacted regarding low blood pressure readings from occupational therapist. What he did not know is that she has directions to hold spironolactone ig BP is low and also to hold Entrsto if BP is low. Occupational therapy has finished today. PT will finish next week. She has referral to [facility] regarding myelodysplasia. She is going to be seen at [facility] here August 10th.

## 2019-04-09 NOTE — Telephone Encounter (Signed)
Please tell [name] to have pt call her Cardiologist

## 2019-04-09 NOTE — Telephone Encounter (Signed)
Left detailed message.   

## 2019-04-09 NOTE — Telephone Encounter (Signed)
Don called back states patient's BP this morning was 90/56 patient is not dizzy or faint. He wants to double check that patient does not need to be seen here.

## 2019-04-09 NOTE — Telephone Encounter (Signed)
Call him back As long as it is 191 systolic, I am OK if she is not dizzy or feeling faint

## 2019-04-09 NOTE — Telephone Encounter (Signed)
Don from [facility] called with BP reading from last week. This morning before therapy 90/56 after therapy 86/48. Patient is asymptomatic, no dizziness,SOB or CP. He said in the afternoons her BP is higher.  108/62 100/60 130/68 104/60 140/70 100/58  Timmothy Sours [phone]

## 2019-04-10 ENCOUNTER — Telehealth: Payer: Self-pay | Admitting: Cardiovascular Disease

## 2019-04-10 DIAGNOSIS — I5023 Acute on chronic systolic (congestive) heart failure: Secondary | ICD-10-CM | POA: Diagnosis not present

## 2019-04-10 DIAGNOSIS — J9601 Acute respiratory failure with hypoxia: Secondary | ICD-10-CM | POA: Diagnosis not present

## 2019-04-10 DIAGNOSIS — D630 Anemia in neoplastic disease: Secondary | ICD-10-CM | POA: Diagnosis not present

## 2019-04-10 DIAGNOSIS — I110 Hypertensive heart disease with heart failure: Secondary | ICD-10-CM | POA: Diagnosis not present

## 2019-04-10 DIAGNOSIS — D469 Myelodysplastic syndrome, unspecified: Secondary | ICD-10-CM | POA: Diagnosis not present

## 2019-04-10 DIAGNOSIS — J15212 Pneumonia due to Methicillin resistant Staphylococcus aureus: Secondary | ICD-10-CM | POA: Diagnosis not present

## 2019-04-10 NOTE — Telephone Encounter (Signed)
Lets DC the lasix She is on Entresto and spironolactone so she may not need to lasix at this point

## 2019-04-10 NOTE — Telephone Encounter (Signed)
New message:    Timmothy Sours from [facility] calling to report BP. Triage nurse will [phone]

## 2019-04-10 NOTE — Telephone Encounter (Signed)
Spoke with Timmothy Sours, OT with [facility].  He seen pt yesterday and pt reported her SBP was over 100 so she went ahead and took her Spironolactone (says she holds it if below 100). Don checked while he was there and it was 14/55.  Rechecked after resting a few mins after exercise was complete and it was 86/48.  He provided other readings from days he has been there: 7/21- 100/60 11:01A, 92/55 11:30A 7/24- 108/62 1:12P 7/27- he called back to check on pt and BP was 102/58 7/28- 105/58 AM  Pt's BP monitor is giving readings that match manual cuff OT is using.  Weights have been consistently 176-177, this morning was actually 175. Advised I would route to Dr. Acie Fredrickson for review.

## 2019-04-11 ENCOUNTER — Telehealth: Payer: Self-pay | Admitting: Internal Medicine

## 2019-04-11 MED ORDER — FUROSEMIDE 20 MG PO TABS: 20.0000 mg | ORAL_TABLET | Freq: Every day | ORAL | 3 refills | Status: DC

## 2019-04-11 MED ORDER — SPIRONOLACTONE 25 MG PO TABS: 12.5000 mg | ORAL_TABLET | Freq: Every day | ORAL | 3 refills | Status: DC

## 2019-04-11 NOTE — Telephone Encounter (Signed)
Agree with compromise ( reducing lasix dose to 20 instead of stopping completely ) She will continue to monitor

## 2019-04-11 NOTE — Addendum Note (Signed)
Addended by: Emmaline Life on: 04/11/2019 10:24 AM   Modules accepted: Orders

## 2019-04-11 NOTE — Telephone Encounter (Signed)
Cancelled appt on 8/19 - per Dr. Walden Field leaving . Pt aware of appt cancellation  And new appt made for 8/5 with Dr. Irene Limbo.

## 2019-04-11 NOTE — Telephone Encounter (Signed)
Called patient and reviewed Dr. Elmarie Shiley advice. She states she is not comfortable with stopping the Lasix. Reports BP: 116/56 mmHg yesterday afternoon and 114/62 this morning before medication. She denies dizziness or any concerns with how she is feeling; states her BP usually runs low. States she hold spironolactone if systolic BP <159 mmHg. I asked her to hold Lasix if systolic BP <47 mmHg and she agrees.  I asked her to decrease Lasix to 20 mg daily and continue to monitor. She verbalized agreement with this plan and expressed gratitude for me listening to her and coming up with a compromise. She request 20 mg tabs be sent to her pharmacy. I advised her to call back with questions or concerns and she thanked me for the call.

## 2019-04-16 DIAGNOSIS — I5023 Acute on chronic systolic (congestive) heart failure: Secondary | ICD-10-CM | POA: Diagnosis not present

## 2019-04-16 DIAGNOSIS — D469 Myelodysplastic syndrome, unspecified: Secondary | ICD-10-CM | POA: Diagnosis not present

## 2019-04-16 DIAGNOSIS — D630 Anemia in neoplastic disease: Secondary | ICD-10-CM | POA: Diagnosis not present

## 2019-04-16 DIAGNOSIS — I110 Hypertensive heart disease with heart failure: Secondary | ICD-10-CM | POA: Diagnosis not present

## 2019-04-16 DIAGNOSIS — J15212 Pneumonia due to Methicillin resistant Staphylococcus aureus: Secondary | ICD-10-CM | POA: Diagnosis not present

## 2019-04-16 DIAGNOSIS — J9601 Acute respiratory failure with hypoxia: Secondary | ICD-10-CM | POA: Diagnosis not present

## 2019-04-17 NOTE — Progress Notes (Signed)
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 female who has been transferred to [place] by Dr. 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 The pt reports that she is doing well overall.  The pt reports that she would not like to restart Vidaza because "her heart is not strong enough," but would consider a lower dose. She had had two transfusions in the past month. The pt has had issues with fluid overload in the past year but has not needed to go to the hospital. Since being in the hospital recently, she has been okay. She has been eating well but reports indigestion.  She lives alone but has friends nearby who are willing to help her.   Today's lab results (04/19/2019) of CBC is as follows: all values are WNL except for RBC at 2.59, HGB at 7.9, HCT at 25.2, RDW at 19.4, PLT at 104k, nRBC at 15.2.  On review of systems, pt reports constant fatigue, indigestion, eating well, past issues of fluid overload and denies belly pain, mouth sores, concerns of infection and any other symptoms.   MEDICAL HISTORY:  Past Medical History:  Diagnosis Date  . Automatic implantable cardioverter-defibrillator in situ    Dr. Beckie Salts follows-next visit- [phone]  . Cataract   . Diverticulosis   . Dyslipidemia   . Ejection fraction < 50%    EF 25-30%, September, 2010  . GERD (gastroesophageal reflux disease)   . Hypothyroidism   . ICD (implantable cardiac defibrillator) in place    10/2009; Dr. Lovena Le  . Iron deficiency 09/21/2018  . LBBB (left bundle branch block)    old  . Macrocytic anemia    . MDS (myelodysplastic syndrome) ([place]) 12/27/2018  . Mitral regurgitation    Mild, echo, September, 2010  . Nonischemic cardiomyopathy ([place])    Normal coronary arteries, catheterization, September, 2009  . Overweight(278.02)   . Serrated polyp of colon   . Systolic CHF, chronic (HCC)    no recent problems    SURGICAL HISTORY: Past Surgical History:  Procedure Laterality Date  . BIOPSY  08/07/2018   Procedure: BIOPSY;  Surgeon: Jerene Bears, MD;  Location: [facility];  Service: Gastroenterology;;  . CARDIAC CATHETERIZATION     '09 last  . [place]    . COLONOSCOPY N/A 11/29/2013   Procedure: COLONOSCOPY;  Surgeon: Jerene Bears, MD;  Location: [facility];  Service: Gastroenterology;  Laterality: N/A;  . COLONOSCOPY N/A 02/25/2015   Procedure: COLONOSCOPY;  Surgeon: Jerene Bears, MD;  Location: [facility];  Service: Gastroenterology;  Laterality: N/A;  . ESOPHAGOGASTRODUODENOSCOPY (EGD) WITH PROPOFOL N/A 08/07/2018   Procedure: ESOPHAGOGASTRODUODENOSCOPY (EGD) WITH PROPOFOL;  Surgeon: Jerene Bears, MD;  Location: [facility];  Service: Gastroenterology;  Laterality: N/A;  . EYE SURGERY Left    x 3-"droopy eyelid"  . heart catherization    . TONSILLECTOMY  1952    SOCIAL HISTORY: Social History   Socioeconomic History  . Marital status: Single    Spouse name: Not on file  . Number of children: Not on file  . Years of education: Not on  file  . Highest education level: Not on file  Occupational History  . Not on file  Social Needs  . Financial resource strain: Not on file  . Food insecurity    Worry: Not on file    Inability: Not on file  . Transportation needs    Medical: Not on file    Non-medical: Not on file  Tobacco Use  . Smoking status: Never Smoker  . Smokeless tobacco: Never Used  Substance and Sexual Activity  . Alcohol use: Yes    Comment: 2-4 beers per week  . Drug use: No  . Sexual activity: Never    Birth  control/protection: Abstinence  Lifestyle  . Physical activity    Days per week: Not on file    Minutes per session: Not on file  . Stress: Not on file  Relationships  . Social Herbalist on phone: Not on file    Gets together: Not on file    Attends religious service: Not on file    Active member of club or organization: Not on file    Attends meetings of clubs or organizations: Not on file    Relationship status: Not on file  . Intimate partner violence    Fear of current or ex partner: Not on file    Emotionally abused: Not on file    Physically abused: Not on file    Forced sexual activity: Not on file  Other Topics Concern  . Not on file  Social History Narrative  . Not on file    FAMILY HISTORY: Family History  Problem Relation Age of Onset  . Heart attack Mother   . Heart disease Mother   . Hypertension Mother   . Lung cancer Father   . Pancreatic cancer Father   . Cancer Father   . Stroke Paternal Grandfather   . Colon cancer Neg Hx   . Diabetes Neg Hx   . Kidney disease Neg Hx   . Liver disease Neg Hx     ALLERGIES:  has No Known Allergies.  MEDICATIONS:  Current Outpatient Medications  Medication Sig Dispense Refill  . albuterol (PROVENTIL HFA;VENTOLIN HFA) 108 (90 Base) MCG/ACT inhaler Inhale 2 puffs into the lungs every 6 (six) hours as needed for wheezing or shortness of breath. 1 Inhaler prn  . carvedilol (COREG) 25 MG tablet Take 1 tablet (25 mg total) by mouth 2 (two) times daily. 180 tablet 3  . Cyanocobalamin (VITAMIN B-12 PO) Take 1 tablet by mouth daily. gummies    . folic acid (FOLVITE) 1 MG tablet TAKE 1 TABLET BY MOUTH EVERY DAY 90 tablet 0  . furosemide (LASIX) 20 MG tablet Take 1 tablet (20 mg total) by mouth daily. 90 tablet 3  . glucose blood (ACCU-CHEK AVIVA) test strip Check daily.  For Diabetes 250.00 100 each 12  . ipratropium-albuterol (DUONEB) 0.5-2.5 (3) MG/3ML SOLN Take 3 mLs by nebulization every 4 (four) hours as  needed (shortness of breath or wheezing).    . Lancets MISC Check glucose daily.  Diabetes 250.00 100 each 11  . levothyroxine (SYNTHROID) 150 MCG tablet Take 1 tablet (150 mcg total) by mouth daily before breakfast. 90 tablet 0  . pantoprazole (PROTONIX) 40 MG tablet Take 1 tablet (40 mg total) by mouth daily. 90 tablet 3  . prochlorperazine (COMPAZINE) 10 MG tablet Take 1 tablet (10 mg total) by mouth every 6 (six) hours as needed for nausea or vomiting. 30 tablet 1  .  Propylene Glycol (SYSTANE BALANCE) 0.6 % SOLN Place 1 drop into both eyes daily as needed (for dry eyes).    . sacubitril-valsartan (ENTRESTO) 24-26 MG Take 1 tablet by mouth 2 (two) times daily. 60 tablet 2  . spironolactone (ALDACTONE) 25 MG tablet Take 0.5 tablets (12.5 mg total) by mouth daily. Hold if systolic BP is <027 mmHg 45 tablet 3   No current facility-administered medications for this visit.     REVIEW OF SYSTEMS:    A 10+ POINT REVIEW OF SYSTEMS WAS OBTAINED including neurology, dermatology, psychiatry, cardiac, respiratory, lymph, extremities, GI, GU, Musculoskeletal, constitutional, breasts, reproductive, HEENT.  All pertinent positives are noted in the HPI.  All others are negative.   PHYSICAL EXAMINATION: ECOG PERFORMANCE STATUS: 2 - Symptomatic, <50% confined to bed  . Vitals:   04/18/19 1129  BP: (!) 109/34  Pulse: 79  Resp: 18  Temp: 98.3 F (36.8 C)  SpO2: 95%   There were no vitals filed for this visit. .Body mass index is 35.87 kg/m.  GENERAL:alert, in no acute distress and comfortable SKIN: no acute rashes, no significant lesions EYES: conjunctiva are pink and non-injected, sclera anicteric OROPHARYNX: MMM, no exudates, no oropharyngeal erythema or ulceration NECK: supple, no JVD LYMPH:  no palpable lymphadenopathy in the cervical, axillary or inguinal regions LUNGS: clear to auscultation b/l with normal respiratory effort HEART: regular rate & rhythm ABDOMEN:  normoactive bowel  sounds , non tender, not distended. Extremity: 1+ pedal edema PSYCH: alert & oriented x 3 with fluent speech NEURO: no focal motor/sensory deficits  LABORATORY DATA:  I have reviewed the data as listed  . CBC Latest Ref Rng & Units 04/18/2019 04/04/2019 03/14/2019  WBC 4.0 - 10.5 K/uL 4.9 4.4 3.7(L)  Hemoglobin 12.0 - 15.0 g/dL 7.9(L) 7.0(L) 7.2(L)  Hematocrit 36.0 - 46.0 % 25.2(L) 22.3(L) 23.0(L)  Platelets 150 - 400 K/uL 104(L) 122(L) 138(L)    . CMP Latest Ref Rng & Units 04/04/2019 03/09/2019 03/09/2019  Glucose 70 - 99 mg/dL 142(H) 111(H) 120(H)  BUN 8 - 23 mg/dL _0 Creatinine 0.44 - 1.00 mg/dL 0.90 0.84 0.88  Sodium 135 - 145 mmol/L 143 142 142  Potassium 3.5 - 5.1 mmol/L 3.5 4.4 4.1  Chloride 98 - 111 mmol/L 103 99 101  CO2 22 - 32 mmol/L 29 32 32  Calcium 8.9 - 10.3 mg/dL 9.6 9.3 9.4  Total Protein 6.5 - 8.1 g/dL 6.1(L) 6.1 6.3(L)  Total Bilirubin 0.3 - 1.2 mg/dL 1.8(H) 0.9 1.2  Alkaline Phos 38 - 126 U/L 58 - 62  AST 15 - 41 U/L 11(L) 10 10(L)  ALT 0 - 44 U/L _1 12/15/2018 Molecular Pathology   12/15/2018 Cytogenetics    RADIOGRAPHIC STUDIES: I have personally reviewed the radiological images as listed and agreed with the findings in the report. Ct Chest High Resolution  Result Date: 04/04/2019 CLINICAL DATA:  Interstitial lung disease EXAM: CT CHEST WITHOUT CONTRAST TECHNIQUE: Multidetector CT imaging of the chest was performed following the standard protocol without intravenous contrast. High resolution imaging of the lungs, as well as inspiratory and expiratory imaging, was performed. COMPARISON:  01/23/2019 FINDINGS: Cardiovascular: Aortic atherosclerosis. The main pulmonary artery is enlarged up to 4.1 cm in caliber. Cardiomegaly with left chest multi lead pacer defibrillator. No pericardial effusion. Mediastinum/Nodes: Prominent although not pathologically enlarged mediastinal, hilar, or axillary lymph nodes. Thyroid gland, trachea, and esophagus  demonstrate no significant findings. Lungs/Pleura: There has been interval resolution of previously  seen extensive bilateral irregular airspace opacity. No evidence of fibrotic interstitial lung disease. Mild lobular air trapping on expiratory phase imaging. Occasional tiny, benign calcified pulmonary nodules. Bandlike scarring or atelectasis of the left lung base. No pleural effusion or pneumothorax. Upper Abdomen: No acute abnormality.  Cholelithiasis. Musculoskeletal: No chest wall mass or suspicious bone lesions identified. IMPRESSION: 1. There has been interval resolution of previously seen extensive bilateral irregular airspace opacity. 2.  No evidence of fibrotic interstitial lung disease. 3. Mild, lobular air trapping on expiratory phase imaging, consistent with small airways disease. 4. Enlargement of the main pulmonary artery up to 4.1 cm in caliber, which can be seen in pulmonary hypertension. 5.  Cardiomegaly. 6.  Aortic atherosclerosis. 7.  Cholelithiasis. Electronically Signed   By: Eddie Candle M.D.   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   FISH: NORMAL  NGS done 12/15/2018 showed CBL mutations which are associated with decreased survival in MDS, RUNX1 mutations that are associated with an unfavorable prognosis.   Negative for FLT3, IDH1 and IDH2 mutations.    Pt was treated with Vidaza beginning 01/08/2019 with most recent dose on 01/12/2019.  She had lab monitoring and was treated with blood transfusions.    Pt was admitted on 01/22/2019 and was discharged on 02/08/2019 due to Acute hypoxic respiratory failure secondary to acute on chronic systolic congestive heart failure and acute lung injury: --Initially treated as pneumonia with Rocephin and azithromycin. Pneumonia ruled out.Respiratory virus panel was negative.  --Treated for acute exacerbation of chronic systolic heart failure. Followed by heart  failure team.   Pt desires to remain on observation and will continue to have labs monitored and transfusion as needed.  2.  CHF and SOB.   -Pt is not currently on oxygen. -Pt has defibrillator in place.   07/17/2018 ECHO showed EF 25-30%.   01/28/2019 ECHO showed EF 20-25%.    3.  Anemia. -Due to MDS -Will continue to monitor counts.   4  Hypothyroidism -Follow-up with PCP for monitoring.   5.  HTN.  -Follow-up with PCP.   PLAN: -Discussed patient's most recent labs from 04/18/2019, HGB at 7.9, PLT slightly low  -Discussed the pt's treatment options at this time: supportive care only vs restarting Vidaza at a lower dose vs Revlimid or Aranesp shots  -Pt is interested in supportive care at this time.  -Discussed that the patient's CHF decreases the likelihood of approval for transplant -Discussed that if the patient decides to pursue a clinical trial at a different institution, we usually allow them to take over pt care to avoid confusion -Order baseline erythropoietin and iron with next labs -Will hold off on [facility] referral at this time -Will hold off on transfusion today -Labs and possible transfusion in 1 wk   Labs and 1 unit of PRBC transfusion in 1 week and then q2weeks RTC with Dr Irene Limbo in 4-5 weeks   No orders of the defined types were placed in this encounter.   All of the patients questions were answered with apparent satisfaction. The patient knows to call the clinic with any problems, questions or concerns.  I spent 30 minutes counseling the patient face to face. The total time spent in the appointment was 43 and more than 50% was on counseling and direct patient cares.   Sullivan Lone MD [place] AAHIVMS [facility] Hematology/Oncology Physician [place]  (Office):  [phone] (Work cell):  [phone] (Fax):           [phone]  04/18/2019 1:59 PM  I, De Burrs, am acting as a scribe for Dr. Irene Limbo  .I have reviewed the above  documentation for accuracy and completeness, and I agree with the above. Brunetta Genera MD

## 2019-04-18 ENCOUNTER — Inpatient Hospital Stay (HOSPITAL_BASED_OUTPATIENT_CLINIC_OR_DEPARTMENT_OTHER): Payer: Medicare Other | Admitting: Hematology

## 2019-04-18 ENCOUNTER — Other Ambulatory Visit: Payer: Medicare Other

## 2019-04-18 ENCOUNTER — Other Ambulatory Visit: Payer: Self-pay

## 2019-04-18 ENCOUNTER — Telehealth: Payer: Self-pay | Admitting: Hematology

## 2019-04-18 ENCOUNTER — Inpatient Hospital Stay: Payer: Medicare Other | Attending: Hematology and Oncology

## 2019-04-18 VITALS — BP 109/34 | HR 79 | Temp 98.3°F | Resp 18 | Ht 59.0 in

## 2019-04-18 DIAGNOSIS — D469 Myelodysplastic syndrome, unspecified: Secondary | ICD-10-CM | POA: Diagnosis not present

## 2019-04-18 DIAGNOSIS — E039 Hypothyroidism, unspecified: Secondary | ICD-10-CM | POA: Diagnosis not present

## 2019-04-18 DIAGNOSIS — D696 Thrombocytopenia, unspecified: Secondary | ICD-10-CM | POA: Diagnosis not present

## 2019-04-18 DIAGNOSIS — I5023 Acute on chronic systolic (congestive) heart failure: Secondary | ICD-10-CM | POA: Diagnosis not present

## 2019-04-18 DIAGNOSIS — I110 Hypertensive heart disease with heart failure: Secondary | ICD-10-CM | POA: Insufficient documentation

## 2019-04-18 DIAGNOSIS — Z79899 Other long term (current) drug therapy: Secondary | ICD-10-CM | POA: Insufficient documentation

## 2019-04-18 DIAGNOSIS — D649 Anemia, unspecified: Secondary | ICD-10-CM | POA: Diagnosis not present

## 2019-04-18 LAB — CBC WITH DIFFERENTIAL (CANCER CENTER ONLY)
Abs Immature Granulocytes: 0.03 10*3/uL (ref 0.00–0.07)
Basophils Absolute: 0 10*3/uL (ref 0.0–0.1)
Basophils Relative: 0 %
Eosinophils Absolute: 0 10*3/uL (ref 0.0–0.5)
Eosinophils Relative: 0 %
HCT: 25.2 % — ABNORMAL LOW (ref 36.0–46.0)
Hemoglobin: 7.9 g/dL — ABNORMAL LOW (ref 12.0–15.0)
Immature Granulocytes: 1 %
Lymphocytes Relative: 25 %
Lymphs Abs: 1.2 10*3/uL (ref 0.7–4.0)
MCH: 30.5 pg (ref 26.0–34.0)
MCHC: 31.3 g/dL (ref 30.0–36.0)
MCV: 97.3 fL (ref 80.0–100.0)
Monocytes Absolute: 0.6 10*3/uL (ref 0.1–1.0)
Monocytes Relative: 12 %
Neutro Abs: 3.1 10*3/uL (ref 1.7–7.7)
Neutrophils Relative %: 62 %
Platelet Count: 104 10*3/uL — ABNORMAL LOW (ref 150–400)
RBC: 2.59 MIL/uL — ABNORMAL LOW (ref 3.87–5.11)
RDW: 19.4 % — ABNORMAL HIGH (ref 11.5–15.5)
WBC Count: 4.9 10*3/uL (ref 4.0–10.5)
WBC Morphology: 5
nRBC: 15.2 % — ABNORMAL HIGH (ref 0.0–0.2)

## 2019-04-18 LAB — PATHOLOGIST SMEAR REVIEW

## 2019-04-18 LAB — SAMPLE TO BLOOD BANK

## 2019-04-18 NOTE — Telephone Encounter (Signed)
Scheduled appt per 8/5 los. ° °Printed calendar and avs. °

## 2019-04-23 ENCOUNTER — Ambulatory Visit (HOSPITAL_COMMUNITY)
Admission: RE | Admit: 2019-04-23 | Discharge: 2019-04-23 | Disposition: A | Discharge status: Home / Self Care | Payer: Medicare Other | Source: Ambulatory Visit | Attending: Internal Medicine | Admitting: Internal Medicine

## 2019-04-23 ENCOUNTER — Other Ambulatory Visit: Payer: Self-pay

## 2019-04-23 ENCOUNTER — Encounter (HOSPITAL_COMMUNITY): Payer: Self-pay | Admitting: Internal Medicine

## 2019-04-23 VITALS — BP 114/32 | HR 82 | Wt 179.2 lb

## 2019-04-23 DIAGNOSIS — I428 Other cardiomyopathies: Secondary | ICD-10-CM | POA: Diagnosis not present

## 2019-04-23 DIAGNOSIS — Z95810 Presence of automatic (implantable) cardiac defibrillator: Secondary | ICD-10-CM | POA: Diagnosis not present

## 2019-04-23 DIAGNOSIS — R5383 Other fatigue: Secondary | ICD-10-CM | POA: Insufficient documentation

## 2019-04-23 DIAGNOSIS — Z79899 Other long term (current) drug therapy: Secondary | ICD-10-CM | POA: Insufficient documentation

## 2019-04-23 DIAGNOSIS — K219 Gastro-esophageal reflux disease without esophagitis: Secondary | ICD-10-CM | POA: Diagnosis not present

## 2019-04-23 DIAGNOSIS — I5022 Chronic systolic (congestive) heart failure: Secondary | ICD-10-CM | POA: Diagnosis not present

## 2019-04-23 DIAGNOSIS — I5043 Acute on chronic combined systolic (congestive) and diastolic (congestive) heart failure: Secondary | ICD-10-CM | POA: Diagnosis not present

## 2019-04-23 DIAGNOSIS — I447 Left bundle-branch block, unspecified: Secondary | ICD-10-CM | POA: Diagnosis not present

## 2019-04-23 DIAGNOSIS — I5042 Chronic combined systolic (congestive) and diastolic (congestive) heart failure: Secondary | ICD-10-CM | POA: Diagnosis not present

## 2019-04-23 DIAGNOSIS — K30 Functional dyspepsia: Secondary | ICD-10-CM | POA: Diagnosis not present

## 2019-04-23 DIAGNOSIS — E039 Hypothyroidism, unspecified: Secondary | ICD-10-CM | POA: Diagnosis not present

## 2019-04-23 DIAGNOSIS — E785 Hyperlipidemia, unspecified: Secondary | ICD-10-CM | POA: Insufficient documentation

## 2019-04-23 DIAGNOSIS — M7989 Other specified soft tissue disorders: Secondary | ICD-10-CM | POA: Diagnosis not present

## 2019-04-23 DIAGNOSIS — D469 Myelodysplastic syndrome, unspecified: Secondary | ICD-10-CM | POA: Insufficient documentation

## 2019-04-23 DIAGNOSIS — Z8249 Family history of ischemic heart disease and other diseases of the circulatory system: Secondary | ICD-10-CM | POA: Insufficient documentation

## 2019-04-23 NOTE — Patient Instructions (Signed)
We will contact you in 6 months to schedule your next appointment.  

## 2019-04-23 NOTE — Progress Notes (Signed)
ADVANCED HF CLINIC CONSULT NOTE    PCP:  Elby Showers, MD       Cardiologist:  [name], MD  Electrophysiologist:  Cristopher Peru, MD   Evaluation Performed:  Follow-Up Visit  Chief Complaint:  Hospital follow up CHF  HPI:  [name] is a 73 y.o. female with with PMH of MDS and severe anemia, chronic systolic and diastolic heart failure (EF 20-25%) due to NICM s/p ICD referred by Dr. Acie Fredrickson for further evaluation of HF.   Gabrielle Grey Jacksonis a 73 y.o.femalewith past medical history significant forMyelodysplastic syndromeand severe anemia, chronic systolic and diastolic heart failure 2/2 NICM s/p ICD.  She was admitted to the hospital 01/22/2019- 02/08/2019 with dyspnea and weakness and found to have O2 sat of 80% on room air and hemoglobin down to 6.1. She was treated for hypoxic respiratory failure due to multifocal pneumonia, acute lung injury and acute exacerbation of systolic heart failure. She was treated with IV antibiotics, initially with BiPAP and lasix and milrinone. She required transfusion with 4 units PRBCs. She was started on Entresto and spironolactone during that admission, in addition to home carvedilol. Arlyce Harman was decreased to 12.5 mg. She was discharged to [facility] for rehab on oxygen with plans to wean over time.   Our office received a call 5/27 regarding medications and low BP. Advised to hold spironolactone for BP <100. Another call on 6/3 indicated that pt was taken to [facility] due to low hemoglobin. She went back to hospital at [facility] and spent 2 nights for blood transfusion. She went home on June 3rd.  Says she is doing ok. Got 1u RBC 3 weeks ago. Last week hgb 7.9. As long as hgb > 7.0 feels ok. Has mild ankle swelling during day then resolves overnight. Recently started on Entresto and lasix cut back to 20mg  daily. Which seems to be working. Weight stable. Can do ADLs and go to store with mild DOE. Mild indigestion when lies flat. Takes  protonix. Wt has been stable at home. 177>178. Takes BP regularly. SBP 100-110/50-60s. Occasionally will dip into 80-90s. Gets fatigued frequently.    Review of Systems: [y] = yes, [ ]  = no   General: Weight gain [ ] ; Weight loss [ ] ; Anorexia [ ] ; Fatigue Blue.Reese ]; Fever [ ] ; Chills [ ] ; Weakness [ ]   Cardiac: Chest pain/pressure [ ] ; Resting SOB [ ] ; Exertional SOB Blue.Reese ]; Orthopnea [ ] ; Pedal Edema [ ] ; Palpitations [ ] ; Syncope [ ] ; Presyncope [ ] ; Paroxysmal nocturnal dyspnea[ ]   Pulmonary: Cough [ ] ; Wheezing[ ] ; Hemoptysis[ ] ; Sputum [ ] ; Snoring [ ]   GI: Vomiting[ ] ; Dysphagia[ ] ; Melena[ ] ; Hematochezia [ ] ; Heartburn[y ]; Abdominal pain [ ] ; Constipation [ ] ; Diarrhea [ ] ; BRBPR [ ]   GU: Hematuria[ ] ; Dysuria [ ] ; Nocturia[ ]   Vascular: Pain in legs with walking [ ] ; Pain in feet with lying flat [ ] ; Non-healing sores [ ] ; Stroke [ ] ; TIA [ ] ; Slurred speech [ ] ;  Neuro: Headaches[ ] ; Vertigo[ ] ; Seizures[ ] ; Paresthesias[ ] ;Blurred vision [ ] ; Diplopia [ ] ; Vision changes [ ]   Ortho/Skin: Arthritis Blue.Reese ]; Joint pain Blue.Reese ]; Muscle pain [ ] ; Joint swelling [ ] ; Back Pain [ ] ; Rash [ ]   Psych: Depression[ ] ; Anxiety[ ]   Heme: Bleeding problems [ ] ; Clotting disorders [ ] ; Anemia Blue.Reese ]  Endocrine: Diabetes [ ] ; Thyroid dysfunction[ ]    Past Medical History:  Diagnosis Date  .  Automatic implantable cardioverter-defibrillator in situ    Dr. Beckie Salts follows-next visit- [phone]  . Cataract   . Diverticulosis   . Dyslipidemia   . Ejection fraction < 50%    EF 25-30%, September, 2010  . GERD (gastroesophageal reflux disease)   . Hypothyroidism   . ICD (implantable cardiac defibrillator) in place    10/2009; Dr. Lovena Le  . Iron deficiency 09/21/2018  . LBBB (left bundle branch block)    old  . Macrocytic anemia   . MDS (myelodysplastic syndrome) ([place]) 12/27/2018  . Mitral regurgitation    Mild, echo, September, 2010  . Nonischemic cardiomyopathy ([place])    Normal coronary arteries,  catheterization, September, 2009  . Overweight(278.02)   . Serrated polyp of colon   . Systolic CHF, chronic (HCC)    no recent problems    Current Outpatient Medications  Medication Sig Dispense Refill  . albuterol (PROVENTIL HFA;VENTOLIN HFA) 108 (90 Base) MCG/ACT inhaler Inhale 2 puffs into the lungs every 6 (six) hours as needed for wheezing or shortness of breath. 1 Inhaler prn  . carvedilol (COREG) 25 MG tablet Take 1 tablet (25 mg total) by mouth 2 (two) times daily. 180 tablet 3  . Cyanocobalamin (VITAMIN B-12 PO) Take 1 tablet by mouth daily. gummies    . folic acid (FOLVITE) 1 MG tablet TAKE 1 TABLET BY MOUTH EVERY DAY 90 tablet 0  . furosemide (LASIX) 20 MG tablet Take 1 tablet (20 mg total) by mouth daily. 90 tablet 3  . glucose blood (ACCU-CHEK AVIVA) test strip Check daily.  For Diabetes 250.00 100 each 12  . ipratropium-albuterol (DUONEB) 0.5-2.5 (3) MG/3ML SOLN Take 3 mLs by nebulization every 4 (four) hours as needed (shortness of breath or wheezing).    . Lancets MISC Check glucose daily.  Diabetes 250.00 100 each 11  . levothyroxine (SYNTHROID) 150 MCG tablet Take 1 tablet (150 mcg total) by mouth daily before breakfast. 90 tablet 0  . pantoprazole (PROTONIX) 40 MG tablet Take 1 tablet (40 mg total) by mouth daily. 90 tablet 3  . prochlorperazine (COMPAZINE) 10 MG tablet Take 1 tablet (10 mg total) by mouth every 6 (six) hours as needed for nausea or vomiting. 30 tablet 1  . Propylene Glycol (SYSTANE BALANCE) 0.6 % SOLN Place 1 drop into both eyes daily as needed (for dry eyes).    . sacubitril-valsartan (ENTRESTO) 24-26 MG Take 1 tablet by mouth 2 (two) times daily. 60 tablet 2  . spironolactone (ALDACTONE) 25 MG tablet Take 0.5 tablets (12.5 mg total) by mouth daily. Hold if systolic BP is <016 mmHg 45 tablet 3   No current facility-administered medications for this encounter.     No Known Allergies    Social History   Socioeconomic History  . Marital status:  Single    Spouse name: Not on file  . Number of children: Not on file  . Years of education: Not on file  . Highest education level: Not on file  Occupational History  . Not on file  Social Needs  . Financial resource strain: Not on file  . Food insecurity    Worry: Not on file    Inability: Not on file  . Transportation needs    Medical: Not on file    Non-medical: Not on file  Tobacco Use  . Smoking status: Never Smoker  . Smokeless tobacco: Never Used  Substance and Sexual Activity  . Alcohol use: Yes    Comment: 2-4 beers per week  .  Drug use: No  . Sexual activity: Never    Birth control/protection: Abstinence  Lifestyle  . Physical activity    Days per week: Not on file    Minutes per session: Not on file  . Stress: Not on file  Relationships  . Social Herbalist on phone: Not on file    Gets together: Not on file    Attends religious service: Not on file    Active member of club or organization: Not on file    Attends meetings of clubs or organizations: Not on file    Relationship status: Not on file  . Intimate partner violence    Fear of current or ex partner: Not on file    Emotionally abused: Not on file    Physically abused: Not on file    Forced sexual activity: Not on file  Other Topics Concern  . Not on file  Social History Narrative  . Not on file      Family History  Problem Relation Age of Onset  . Heart attack Mother   . Heart disease Mother   . Hypertension Mother   . Lung cancer Father   . Pancreatic cancer Father   . Cancer Father   . Stroke Paternal Grandfather   . Colon cancer Neg Hx   . Diabetes Neg Hx   . Kidney disease Neg Hx   . Liver disease Neg Hx     Vitals:   04/23/19 1517  BP: (!) 114/32  Pulse: 82  SpO2: 96%  Weight: 81.3 kg (179 lb 3.2 oz)    PHYSICAL EXAM: General:  Obese elderly woman No respiratory difficulty HEENT: normal Neck: supple. no JVD. Carotids 2+ bilat; no bruits. No lymphadenopathy or  thryomegaly appreciated. Cor: PMI nondisplaced. Regular rate & rhythm. No rubs, gallops or murmurs. Lungs: clear Abdomen: obese soft, nontender, nondistended. No hepatosplenomegaly. No bruits or masses. Good bowel sounds. Extremities: no cyanosis, clubbing, rash, edema Neuro: alert & oriented x 3, cranial nerves grossly intact. moves all 4 extremities w/o difficulty. Affect pleasant.  ASSESSMENT & PLAN:  1. Acute on chronic combined CHF:  - Echo 5/20 EF 20-25% due to NICM. Cath 2009 no CAD - Currently NYHA III. Volume status looks good  - Her medical regimen has been optimized quite nicely and currently BP too soft to titrate further. - Given fatigue, I did suggest that she could attempt a trial of cutting carvedilol to 12.5 bid to see if that helps some - Continue Entresto 24/26 bid - Continue spiro 12.5 - Continue lasix 20 daily.  Titrate up/down as needed -She has LBBB but agree with Dr. Lovena Le that the risk of upgrade to CRT with her immunocompromised state (MDS) probably not worth it - Keep hgb > 7.5 - BSci ICD interrogated personally in clinic. No VT or shocks. Activity level ~1hr/day  2. MDS: - Transfusion dependent. Keep hgb > 7.5 - May be worth a trial of Aranesp   We will follow at a distance in HF Clinic as her care has already been well optimized and she is not candidate for advanced therapies.   Glori Bickers, MD  3:22 PM

## 2019-04-25 ENCOUNTER — Inpatient Hospital Stay: Payer: Medicare Other

## 2019-04-25 ENCOUNTER — Other Ambulatory Visit: Payer: Self-pay | Admitting: *Deleted

## 2019-04-25 ENCOUNTER — Other Ambulatory Visit: Payer: Self-pay

## 2019-04-25 DIAGNOSIS — E039 Hypothyroidism, unspecified: Secondary | ICD-10-CM | POA: Diagnosis not present

## 2019-04-25 DIAGNOSIS — D469 Myelodysplastic syndrome, unspecified: Secondary | ICD-10-CM

## 2019-04-25 DIAGNOSIS — Z79899 Other long term (current) drug therapy: Secondary | ICD-10-CM | POA: Diagnosis not present

## 2019-04-25 DIAGNOSIS — I5023 Acute on chronic systolic (congestive) heart failure: Secondary | ICD-10-CM | POA: Diagnosis not present

## 2019-04-25 DIAGNOSIS — D649 Anemia, unspecified: Secondary | ICD-10-CM

## 2019-04-25 DIAGNOSIS — I110 Hypertensive heart disease with heart failure: Secondary | ICD-10-CM | POA: Diagnosis not present

## 2019-04-25 LAB — CBC WITH DIFFERENTIAL/PLATELET
Abs Immature Granulocytes: 0 10*3/uL (ref 0.00–0.07)
Band Neutrophils: 14 %
Basophils Absolute: 0 10*3/uL (ref 0.0–0.1)
Basophils Relative: 0 %
Blasts: 5 %
Eosinophils Absolute: 0 10*3/uL (ref 0.0–0.5)
Eosinophils Relative: 0 %
HCT: 24.8 % — ABNORMAL LOW (ref 36.0–46.0)
Hemoglobin: 7.9 g/dL — ABNORMAL LOW (ref 12.0–15.0)
Lymphocytes Relative: 33 %
Lymphs Abs: 1.6 10*3/uL (ref 0.7–4.0)
MCH: 30.2 pg (ref 26.0–34.0)
MCHC: 31.9 g/dL (ref 30.0–36.0)
MCV: 94.7 fL (ref 80.0–100.0)
Monocytes Absolute: 0.1 10*3/uL (ref 0.1–1.0)
Monocytes Relative: 3 %
Myelocytes: 1 %
Neutro Abs: 2.8 10*3/uL (ref 1.7–17.7)
Neutrophils Relative %: 44 %
Platelets: 105 10*3/uL — ABNORMAL LOW (ref 150–400)
RBC: 2.62 MIL/uL — ABNORMAL LOW (ref 3.87–5.11)
RDW: 19.7 % — ABNORMAL HIGH (ref 11.5–15.5)
WBC: 4.8 10*3/uL (ref 4.0–10.5)
nRBC: 22.7 % — ABNORMAL HIGH (ref 0.0–0.2)

## 2019-04-25 LAB — CMP (CANCER CENTER ONLY)
ALT: 11 U/L (ref 0–44)
AST: 13 U/L — ABNORMAL LOW (ref 15–41)
Albumin: 3.6 g/dL (ref 3.5–5.0)
Alkaline Phosphatase: 45 U/L (ref 38–126)
Anion gap: 13 (ref 5–15)
BUN: 16 mg/dL (ref 8–23)
CO2: 28 mmol/L (ref 22–32)
Calcium: 9.5 mg/dL (ref 8.9–10.3)
Chloride: 103 mmol/L (ref 98–111)
Creatinine: 0.97 mg/dL (ref 0.44–1.00)
GFR, Est AFR Am: >60 mL/min (ref >60)
GFR, Estimated: 58 mL/min — ABNORMAL LOW (ref >60)
Glucose, Bld: 135 mg/dL — ABNORMAL HIGH (ref 70–99)
Potassium: 3.7 mmol/L (ref 3.5–5.1)
Sodium: 144 mmol/L (ref 135–145)
Total Bilirubin: 2 mg/dL — ABNORMAL HIGH (ref 0.3–1.2)
Total Protein: 5.8 g/dL — ABNORMAL LOW (ref 6.5–8.1)

## 2019-04-25 LAB — PREPARE RBC (CROSSMATCH)

## 2019-04-25 LAB — SAMPLE TO BLOOD BANK

## 2019-04-25 MED ORDER — FUROSEMIDE 10 MG/ML IJ SOLN
INTRAMUSCULAR | Status: AC
  Filled 2019-04-25: qty 2

## 2019-04-25 MED ORDER — DIPHENHYDRAMINE HCL 25 MG PO CAPS
ORAL_CAPSULE | ORAL | Status: AC
  Filled 2019-04-25: qty 1

## 2019-04-25 MED ORDER — FUROSEMIDE 10 MG/ML IJ SOLN: 20.0000 mg | Freq: Once | INTRAMUSCULAR | Status: DC

## 2019-04-25 MED ORDER — ACETAMINOPHEN 325 MG PO TABS
ORAL_TABLET | ORAL | Status: AC
  Filled 2019-04-25: qty 2

## 2019-04-25 MED ORDER — DIPHENHYDRAMINE HCL 25 MG PO CAPS
25.0000 mg | ORAL_CAPSULE | Freq: Once | ORAL | Status: AC
  Administered 2019-04-25: 25 mg via ORAL

## 2019-04-25 MED ORDER — SODIUM CHLORIDE 0.9% IV SOLUTION
250.0000 mL | Freq: Once | INTRAVENOUS | Status: AC
  Administered 2019-04-25: 250 mL via INTRAVENOUS
  Filled 2019-04-25: qty 250

## 2019-04-25 MED ORDER — ACETAMINOPHEN 325 MG PO TABS
650.0000 mg | ORAL_TABLET | Freq: Once | ORAL | Status: AC
  Administered 2019-04-25: 650 mg via ORAL

## 2019-04-25 NOTE — Progress Notes (Signed)
Verbal order from Dr. Irene Limbo: [name] to hold Lasix IV due to low BP. Patient can take an additional Lasix 20 mg po at home if she feels SOB or swelling in feet. Patient informed and she verbalized understanding.

## 2019-04-25 NOTE — Patient Instructions (Signed)
Blood Transfusion, Adult, Care After This sheet gives you information about how to care for yourself after your procedure. Your doctor may also give you more specific instructions. If you have problems or questions, contact your doctor. Follow these instructions at home:   Take over-the-counter and prescription medicines only as told by your doctor.  Go back to your normal activities as told by your doctor.  Follow instructions from your doctor about how to take care of the area where an IV tube was put into your vein (insertion site). Make sure you: ? Wash your hands with soap and water before you change your bandage (dressing). If there is no soap and water, use hand sanitizer. ? Change your bandage as told by your doctor.  Check your IV insertion site every day for signs of infection. Check for: ? More redness, swelling, or pain. ? More fluid or blood. ? Warmth. ? Pus or a bad smell. Contact a doctor if:  You have more redness, swelling, or pain around the IV insertion site.  You have more fluid or blood coming from the IV insertion site.  Your IV insertion site feels warm to the touch.  You have pus or a bad smell coming from the IV insertion site.  Your pee (urine) turns pink, red, or brown.  You feel weak after doing your normal activities. Get help right away if:  You have signs of a serious allergic or body defense (immune) system reaction, including: ? Itchiness. ? Hives. ? Trouble breathing. ? Anxiety. ? Pain in your chest or lower back. ? Fever, flushing, and chills. ? Fast pulse. ? Rash. ? Watery poop (diarrhea). ? Throwing up (vomiting). ? Dark pee. ? Serious headache. ? Dizziness. ? Stiff neck. ? Yellow color in your face or the white parts of your eyes (jaundice). Summary  After a blood transfusion, return to your normal activities as told by your doctor.  Every day, check for signs of infection where the IV tube was put into your vein.  Some  signs of infection are warm skin, more redness and pain, more fluid or blood, and pus or a bad smell where the needle went in.  Contact your doctor if you feel weak or have any unusual symptoms. This information is not intended to replace advice given to you by your health care provider. Make sure you discuss any questions you have with your health care provider. Document Released: 09/20/2014 Document Revised: 01/04/2018 Document Reviewed: 04/23/2016 Elsevier Patient Education  2020 [employer]  Coronavirus (COVID-19) Are you at risk?  Are you at risk for the Coronavirus (COVID-19)?  To be considered HIGH RISK for Coronavirus (COVID-19), you have to meet the following criteria:  . Traveled to [place], [place], [place], [place] or [place]; or in the [place] to [place], [place], [place], or [place]; and have fever, cough, and shortness of breath within the last 2 weeks of travel OR . Been in close contact with a person diagnosed with COVID-19 within the last 2 weeks and have fever, cough, and shortness of breath . IF YOU DO NOT MEET THESE CRITERIA, YOU ARE CONSIDERED LOW RISK FOR COVID-19.  What to do if you are HIGH RISK for COVID-19?  . If you are having a medical emergency, call 911. . Seek medical care right away. Before you go to a doctor's office, urgent care or emergency department, call ahead and tell them about your recent travel, contact with someone diagnosed with COVID-19, and   your symptoms. You should receive instructions from your physician's office regarding next steps of care.  . When you arrive at healthcare provider, tell the healthcare staff immediately you have returned from visiting [place], [place], [place], [place] or [place]; or traveled in the [place] to [place], [place], [place], or [place]; in the last two weeks or you have been in close contact with a person diagnosed with COVID-19 in the last 2 weeks.   . Tell the health care staff about  your symptoms: fever, cough and shortness of breath. . After you have been seen by a medical provider, you will be either: o Tested for (COVID-19) and discharged home on quarantine except to seek medical care if symptoms worsen, and asked to  - Stay home and avoid contact with others until you get your results (4-5 days)  - Avoid travel on public transportation if possible (such as bus, train, or airplane) or o Sent to the Emergency Department by EMS for evaluation, COVID-19 testing, and possible admission depending on your condition and test results.  What to do if you are LOW RISK for COVID-19?  Reduce your risk of any infection by using the same precautions used for avoiding the common cold or flu:  . Wash your hands often with soap and warm water for at least 20 seconds.  If soap and water are not readily available, use an alcohol-based hand sanitizer with at least 60% alcohol.  . If coughing or sneezing, cover your mouth and nose by coughing or sneezing into the elbow areas of your shirt or coat, into a tissue or into your sleeve (not your hands). . Avoid shaking hands with others and consider head nods or verbal greetings only. . Avoid touching your eyes, nose, or mouth with unwashed hands.  . Avoid close contact with people who are sick. . Avoid places or events with large numbers of people in one location, like concerts or sporting events. . Carefully consider travel plans you have or are making. . If you are planning any travel outside or inside the [place], visit the CDC's Travelers' Health webpage for the latest health notices. . If you have some symptoms but not all symptoms, continue to monitor at home and seek medical attention if your symptoms worsen. . If you are having a medical emergency, call 911.   ADDITIONAL HEALTHCARE OPTIONS FOR PATIENTS  [facility] / e-Visit: [email or web address]         [facility]:  [phone]  [facility]: [phone]                   [facility]: [phone]   

## 2019-04-26 LAB — BPAM RBC
Blood Product Expiration Date: 202009092359
ISSUE DATE / TIME: 202008121008
Unit Type and Rh: 5100

## 2019-04-26 LAB — TYPE AND SCREEN
ABO/RH(D): O POS
Antibody Screen: NEGATIVE
Unit division: 0

## 2019-04-26 LAB — ERYTHROPOIETIN: Erythropoietin: 89.1 m[IU]/mL — ABNORMAL HIGH (ref 2.6–18.5)

## 2019-05-02 ENCOUNTER — Ambulatory Visit: Payer: Medicare Other | Admitting: Internal Medicine

## 2019-05-02 ENCOUNTER — Other Ambulatory Visit: Payer: Medicare Other

## 2019-05-09 ENCOUNTER — Encounter: Payer: Self-pay | Admitting: *Deleted

## 2019-05-09 ENCOUNTER — Other Ambulatory Visit: Payer: Self-pay

## 2019-05-09 ENCOUNTER — Encounter: Payer: Self-pay | Admitting: Hematology

## 2019-05-09 ENCOUNTER — Inpatient Hospital Stay: Payer: Medicare Other

## 2019-05-09 DIAGNOSIS — Z79899 Other long term (current) drug therapy: Secondary | ICD-10-CM | POA: Diagnosis not present

## 2019-05-09 DIAGNOSIS — D469 Myelodysplastic syndrome, unspecified: Secondary | ICD-10-CM

## 2019-05-09 DIAGNOSIS — E039 Hypothyroidism, unspecified: Secondary | ICD-10-CM | POA: Diagnosis not present

## 2019-05-09 DIAGNOSIS — I5023 Acute on chronic systolic (congestive) heart failure: Secondary | ICD-10-CM | POA: Diagnosis not present

## 2019-05-09 DIAGNOSIS — I110 Hypertensive heart disease with heart failure: Secondary | ICD-10-CM | POA: Diagnosis not present

## 2019-05-09 LAB — CMP (CANCER CENTER ONLY)
ALT: 10 U/L (ref 0–44)
AST: 12 U/L — ABNORMAL LOW (ref 15–41)
Albumin: 3.6 g/dL (ref 3.5–5.0)
Alkaline Phosphatase: 47 U/L (ref 38–126)
Anion gap: 8 (ref 5–15)
BUN: 18 mg/dL (ref 8–23)
CO2: 28 mmol/L (ref 22–32)
Calcium: 9.2 mg/dL (ref 8.9–10.3)
Chloride: 105 mmol/L (ref 98–111)
Creatinine: 0.88 mg/dL (ref 0.44–1.00)
GFR, Est AFR Am: >60 mL/min (ref >60)
GFR, Estimated: >60 mL/min (ref >60)
Glucose, Bld: 127 mg/dL — ABNORMAL HIGH (ref 70–99)
Potassium: 3.7 mmol/L (ref 3.5–5.1)
Sodium: 141 mmol/L (ref 135–145)
Total Bilirubin: 2.1 mg/dL — ABNORMAL HIGH (ref 0.3–1.2)
Total Protein: 5.8 g/dL — ABNORMAL LOW (ref 6.5–8.1)

## 2019-05-09 LAB — CBC WITH DIFFERENTIAL (CANCER CENTER ONLY)
Abs Immature Granulocytes: 0.03 10*3/uL (ref 0.00–0.07)
Basophils Absolute: 0 10*3/uL (ref 0.0–0.1)
Basophils Relative: 0 %
Eosinophils Absolute: 0 10*3/uL (ref 0.0–0.5)
Eosinophils Relative: 0 %
HCT: 26.5 % — ABNORMAL LOW (ref 36.0–46.0)
Hemoglobin: 8.5 g/dL — ABNORMAL LOW (ref 12.0–15.0)
Immature Granulocytes: 1 %
Lymphocytes Relative: 42 %
Lymphs Abs: 1.7 10*3/uL (ref 0.7–4.0)
MCH: 29.7 pg (ref 26.0–34.0)
MCHC: 32.1 g/dL (ref 30.0–36.0)
MCV: 92.7 fL (ref 80.0–100.0)
Monocytes Absolute: 0.5 10*3/uL (ref 0.1–1.0)
Monocytes Relative: 11 %
Neutro Abs: 1.9 10*3/uL (ref 1.7–7.7)
Neutrophils Relative %: 46 %
Platelet Count: 93 10*3/uL — ABNORMAL LOW (ref 150–400)
RBC: 2.86 MIL/uL — ABNORMAL LOW (ref 3.87–5.11)
RDW: 19 % — ABNORMAL HIGH (ref 11.5–15.5)
WBC Count: 4 10*3/uL (ref 4.0–10.5)
WBC Morphology: 5
nRBC: 18.8 % — ABNORMAL HIGH (ref 0.0–0.2)

## 2019-05-09 LAB — SAMPLE TO BLOOD BANK

## 2019-05-09 LAB — LACTATE DEHYDROGENASE: LDH: 120 U/L (ref 98–192)

## 2019-05-09 LAB — FERRITIN: Ferritin: 463 ng/mL — ABNORMAL HIGH (ref 11–307)

## 2019-05-09 NOTE — Progress Notes (Signed)
Met with patient in lobby to introduce myself as Arboriculturist and to offer available resources.  Discussed one-time $700 Engineer, drilling. Gave patient my card if interested in applying and for any additional financial questions or concerns.

## 2019-05-09 NOTE — Progress Notes (Signed)
Hgb 8.5 today.  Pt does not need blood transfusion per Dr. Irene Limbo.  Pt notified.  Pt discharged to home.

## 2019-05-16 ENCOUNTER — Other Ambulatory Visit: Payer: Self-pay | Admitting: Internal Medicine

## 2019-05-17 ENCOUNTER — Other Ambulatory Visit: Payer: Medicare Other | Admitting: Internal Medicine

## 2019-05-17 ENCOUNTER — Other Ambulatory Visit: Payer: Self-pay

## 2019-05-17 DIAGNOSIS — J9611 Chronic respiratory failure with hypoxia: Secondary | ICD-10-CM | POA: Diagnosis not present

## 2019-05-17 DIAGNOSIS — F32A Depression, unspecified: Secondary | ICD-10-CM

## 2019-05-17 DIAGNOSIS — I10 Essential (primary) hypertension: Secondary | ICD-10-CM | POA: Diagnosis not present

## 2019-05-17 DIAGNOSIS — E039 Hypothyroidism, unspecified: Secondary | ICD-10-CM | POA: Diagnosis not present

## 2019-05-17 DIAGNOSIS — D469 Myelodysplastic syndrome, unspecified: Secondary | ICD-10-CM

## 2019-05-17 DIAGNOSIS — Z9981 Dependence on supplemental oxygen: Secondary | ICD-10-CM | POA: Diagnosis not present

## 2019-05-17 DIAGNOSIS — Z0000 Encounter for general adult medical examination without abnormal findings: Secondary | ICD-10-CM

## 2019-05-17 DIAGNOSIS — K219 Gastro-esophageal reflux disease without esophagitis: Secondary | ICD-10-CM

## 2019-05-17 DIAGNOSIS — M8580 Other specified disorders of bone density and structure, unspecified site: Secondary | ICD-10-CM | POA: Diagnosis not present

## 2019-05-17 DIAGNOSIS — F419 Anxiety disorder, unspecified: Secondary | ICD-10-CM

## 2019-05-17 DIAGNOSIS — F329 Major depressive disorder, single episode, unspecified: Secondary | ICD-10-CM | POA: Diagnosis not present

## 2019-05-17 DIAGNOSIS — E8881 Metabolic syndrome: Secondary | ICD-10-CM | POA: Diagnosis not present

## 2019-05-17 DIAGNOSIS — R7302 Impaired glucose tolerance (oral): Secondary | ICD-10-CM

## 2019-05-17 DIAGNOSIS — I429 Cardiomyopathy, unspecified: Secondary | ICD-10-CM

## 2019-05-17 DIAGNOSIS — E7800 Pure hypercholesterolemia, unspecified: Secondary | ICD-10-CM

## 2019-05-18 LAB — HEMOGLOBIN A1C
Hgb A1c MFr Bld: 5.8 % of total Hgb — ABNORMAL HIGH (ref <5.7)
Mean Plasma Glucose: 120 (calc)
eAG (mmol/L): 6.6 (calc)

## 2019-05-18 LAB — CBC WITH DIFFERENTIAL/PLATELET
Absolute Monocytes: 730 cells/uL (ref 200–950)
Basophils Absolute: 10 cells/uL (ref 0–200)
Basophils Relative: 0.2 %
Eosinophils Absolute: 0 cells/uL — ABNORMAL LOW (ref 15–500)
Eosinophils Relative: 0 %
HCT: 27.4 % — ABNORMAL LOW (ref 35.0–45.0)
Hemoglobin: 8.6 g/dL — ABNORMAL LOW (ref 11.7–15.5)
Lymphs Abs: 2006 cells/uL (ref 850–3900)
MCH: 29.9 pg (ref 27.0–33.0)
MCHC: 31.4 g/dL — ABNORMAL LOW (ref 32.0–36.0)
MCV: 95.1 fL (ref 80.0–100.0)
MPV: 11.1 fL (ref 7.5–12.5)
Monocytes Relative: 15.2 %
Neutro Abs: 2054 cells/uL (ref 1500–7800)
Neutrophils Relative %: 42.8 %
Platelets: 81 10*3/uL — ABNORMAL LOW (ref 140–400)
RBC: 2.88 10*6/uL — ABNORMAL LOW (ref 3.80–5.10)
RDW: 19.8 % — ABNORMAL HIGH (ref 11.0–15.0)
Total Lymphocyte: 41.8 %
WBC: 4.8 10*3/uL (ref 3.8–10.8)

## 2019-05-18 LAB — COMPLETE METABOLIC PANEL WITH GFR
AG Ratio: 2.4 (calc) (ref 1.0–2.5)
ALT: 10 U/L (ref 6–29)
AST: 14 U/L (ref 10–35)
Albumin: 3.9 g/dL (ref 3.6–5.1)
Alkaline phosphatase (APISO): 40 U/L (ref 37–153)
BUN: 15 mg/dL (ref 7–25)
CO2: 31 mmol/L (ref 20–32)
Calcium: 9.3 mg/dL (ref 8.6–10.4)
Chloride: 104 mmol/L (ref 98–110)
Creat: 0.74 mg/dL (ref 0.60–0.93)
GFR, Est African American: 94 mL/min/{1.73_m2} (ref >=60)
GFR, Est Non African American: 81 mL/min/{1.73_m2} (ref >=60)
Globulin: 1.6 g/dL (calc) — ABNORMAL LOW (ref 1.9–3.7)
Glucose, Bld: 111 mg/dL — ABNORMAL HIGH (ref 65–99)
Potassium: 3.8 mmol/L (ref 3.5–5.3)
Sodium: 141 mmol/L (ref 135–146)
Total Bilirubin: 2.4 mg/dL — ABNORMAL HIGH (ref 0.2–1.2)
Total Protein: 5.5 g/dL — ABNORMAL LOW (ref 6.1–8.1)

## 2019-05-18 LAB — LIPID PANEL
Cholesterol: 128 mg/dL (ref <200)
HDL: 28 mg/dL — ABNORMAL LOW (ref >=50)
LDL Cholesterol (Calc): 80 mg/dL (calc)
Non-HDL Cholesterol (Calc): 100 mg/dL (calc) (ref <130)
Total CHOL/HDL Ratio: 4.6 (calc) (ref <5.0)
Triglycerides: 106 mg/dL (ref <150)

## 2019-05-18 LAB — TSH: TSH: 0.24 mIU/L — ABNORMAL LOW (ref 0.40–4.50)

## 2019-05-22 ENCOUNTER — Ambulatory Visit
Admission: RE | Admit: 2019-05-22 | Discharge: 2019-05-22 | Disposition: A | Discharge status: Home / Self Care | Payer: Medicare Other | Source: Ambulatory Visit | Attending: Internal Medicine | Admitting: Internal Medicine

## 2019-05-22 ENCOUNTER — Other Ambulatory Visit: Payer: Self-pay

## 2019-05-22 ENCOUNTER — Ambulatory Visit (INDEPENDENT_AMBULATORY_CARE_PROVIDER_SITE_OTHER): Payer: Medicare Other | Admitting: *Deleted

## 2019-05-22 ENCOUNTER — Ambulatory Visit (INDEPENDENT_AMBULATORY_CARE_PROVIDER_SITE_OTHER): Payer: Medicare Other | Admitting: Internal Medicine

## 2019-05-22 ENCOUNTER — Encounter: Payer: Self-pay | Admitting: Internal Medicine

## 2019-05-22 VITALS — BP 102/70 | HR 74 | Temp 98.2°F | Ht 59.0 in | Wt 176.0 lb

## 2019-05-22 DIAGNOSIS — D469 Myelodysplastic syndrome, unspecified: Secondary | ICD-10-CM | POA: Diagnosis not present

## 2019-05-22 DIAGNOSIS — F329 Major depressive disorder, single episode, unspecified: Secondary | ICD-10-CM

## 2019-05-22 DIAGNOSIS — F419 Anxiety disorder, unspecified: Secondary | ICD-10-CM

## 2019-05-22 DIAGNOSIS — I10 Essential (primary) hypertension: Secondary | ICD-10-CM | POA: Diagnosis not present

## 2019-05-22 DIAGNOSIS — Z0000 Encounter for general adult medical examination without abnormal findings: Secondary | ICD-10-CM

## 2019-05-22 DIAGNOSIS — Z95810 Presence of automatic (implantable) cardiac defibrillator: Secondary | ICD-10-CM

## 2019-05-22 DIAGNOSIS — R062 Wheezing: Secondary | ICD-10-CM

## 2019-05-22 DIAGNOSIS — I428 Other cardiomyopathies: Secondary | ICD-10-CM | POA: Diagnosis not present

## 2019-05-22 DIAGNOSIS — R7302 Impaired glucose tolerance (oral): Secondary | ICD-10-CM

## 2019-05-22 DIAGNOSIS — E039 Hypothyroidism, unspecified: Secondary | ICD-10-CM

## 2019-05-22 DIAGNOSIS — K219 Gastro-esophageal reflux disease without esophagitis: Secondary | ICD-10-CM

## 2019-05-22 DIAGNOSIS — R0602 Shortness of breath: Secondary | ICD-10-CM | POA: Diagnosis not present

## 2019-05-22 DIAGNOSIS — E8881 Metabolic syndrome: Secondary | ICD-10-CM | POA: Diagnosis not present

## 2019-05-22 DIAGNOSIS — R7989 Other specified abnormal findings of blood chemistry: Secondary | ICD-10-CM | POA: Diagnosis not present

## 2019-05-22 LAB — POCT URINALYSIS DIPSTICK
Appearance: NEGATIVE
Bilirubin, UA: NEGATIVE
Blood, UA: NEGATIVE
Glucose, UA: NEGATIVE
Ketones, UA: NEGATIVE
Leukocytes, UA: NEGATIVE
Nitrite, UA: NEGATIVE
Odor: NEGATIVE
Protein, UA: NEGATIVE
Spec Grav, UA: 1.01 (ref 1.010–1.025)
Urobilinogen, UA: 0.2 E.U./dL
pH, UA: 6 (ref 5.0–8.0)

## 2019-05-22 MED ORDER — IPRATROPIUM-ALBUTEROL 0.5-2.5 (3) MG/3ML IN SOLN: 3.0000 mL | RESPIRATORY_TRACT | 11 refills | Status: AC | PRN

## 2019-05-22 NOTE — Progress Notes (Signed)
Subjective:    Patient ID: [name], female    DOB: 1945/12/29, 73 y.o.   MRN: [id number]  HPI 73 year old Female for health maintenance exam and evaluation of medical issues. 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 Is being observed. Currently not on treatment.   Hgb is stable and last had transfusion about a month ago.  Was hospitalized in May with acute respiratory failure for some 17-days.  Was treated with antibiotics.  Was also found to have an acute exacerbation of heart failure and was started on milrinone and IV Lasix.  She was treated with BiPAP and steroids.  Was discharged on oxygen and was sent to rehab facility in [place] on May 29 where she stayed until June 10.  She is on Entresto for heart failure.  She has hypothyroidism and GE reflux.  Her ejection fraction is 20 to 25% based on echo in May 2020.  She is requiring of transfusions due to MDS.  Dr. Walden Field treated were her with 5 days in late April but currently she is just being observed and has new hematologist Dr. Irene Limbo is Dr. Walden Field has left.  No longer has home health services. Is doing OK at home. Is able to drive.  Has home oxygen but is not using it. Wants to turn it in.  BP has been low lately. Still sees Dr. Acie Fredrickson and Dr. Jeffie Pollock.  Dr. Jeffie Pollock wants Hgb to be over 7.5 grams. Dose of Furosemide has been lowered.  Has cardiac defibrillator, mitral regurgitation, left bundle branch block, nonischemic cardiomyopathy, hyperlipidemia, impaired glucose tolerance, history of asthmatic bronchitis.  History of hypertension.  History of anxiety and depression.  She does not smoke.  Occasionally drinks beer.  She has a living will.  No known drug allergies.  She wears glasses.  Ophthalmologist is Dr. Herbert Deaner.  Dislocated her right shoulder 2001.  Multiple eye surgeries as a child.  Tonsillectomy at age 45.  History of allergic rhinitis  Social history: She is retired.  Previously worked at [employer] and subsequently worked at [employer].  She is single.  Completed 4 years of college.  Family history: Father died of lung cancer in his 5s.  Mother died at age 35 of an MI with history of possible diabetes.  No siblings.  No children.  MDS was diagnosed late last year when she presented with anemia and macrocytosis.      Review of Systems  Constitutional: Positive for fatigue.  Respiratory: Positive for wheezing.   Cardiovascular: Negative for chest pain.  Gastrointestinal: Negative.   Neurological: Positive for weakness.  Psychiatric/Behavioral: Negative.        Objective:   Physical Exam Vitals signs reviewed.  Constitutional:      General: She is not in acute distress.    Appearance: Normal appearance. She is not diaphoretic.  HENT:     Head: Normocephalic.     Right Ear: Tympanic membrane normal.     Left Ear: Tympanic membrane normal.     Nose: Nose normal.  Eyes:     Extraocular Movements: Extraocular movements intact.     Pupils: Pupils are equal, round, and reactive to light.  Neck:     Musculoskeletal: Neck supple.     Vascular: No carotid bruit.     Comments: No thyromegaly Cardiovascular:     Rate and Rhythm: Normal rate and regular rhythm.     Pulses: Normal pulses.     Heart sounds:  No murmur.  Pulmonary:     Effort: Pulmonary effort is normal.     Breath sounds: Wheezing present. No rhonchi or rales.  Abdominal:     General: Bowel sounds are normal.     Palpations: Abdomen is soft. There is no mass.     Tenderness: There is no guarding or rebound.  Musculoskeletal:     Right lower leg: No edema.     Left lower leg: No edema.  Lymphadenopathy:     Cervical: No cervical adenopathy.  Skin:    General: Skin is warm and dry.     Findings: No rash.  Neurological:     General: No focal deficit present.     Mental Status: She is alert and oriented to person, place, and time.     Motor: Weakness present.  Psychiatric:        Mood  and Affect: Mood normal.        Behavior: Behavior normal.        Thought Content: Thought content normal.        Judgment: Judgment normal.           Assessment & Plan:  Myelodysplasia followed by Dr. Irene Limbo.  Requires intermittent transfusions.  Wheezing- will have have chest x-ray today.  Has been short of breath.  Addendum: Chest x-ray shows no pneumonia or edema.  No pleural effusions.  There is cardiomegaly and vascular congestion.  History of cardiomyopathy and congestive heart failure treated with multiple medications.  Has cardiac defibrillator.  Followed closely by Cardiology  History of mitral regurgitation  History of left bundle branch block  History of allergic rhinitis  History of asthmatic bronchitis and bronchospasm generally with 1 or 2 bouts a year  History of impaired glucose tolerance with hemoglobin A1c stable at 5.8%  Hyperlipidemia treated with lipid-lowering medication  Obesity  Hypothyroidism-her TSH is low on levothyroxine 150 mcg daily.  I like to recheck that in a couple of weeks to see if we need to adjust her dose.  It was previously okay in March 2020 and November 2019  GE reflux treated with PPI  Subjective:   Patient presents for Medicare Annual/Subsequent preventive examination.  Review Past Medical/Family/Social: See above  Risk Factors  Current exercise habits: Sedentary due to heart issues and myelodysplasia Dietary issues discussed: Low-fat low carbohydrate  Cardiac risk factors: Impaired glucose tolerance, hyperlipidemia, obesity, family history  Depression Screen  (Note: if answer to either of the following is "Yes", a more complete depression screening is indicated)   Over the past two weeks, have you felt down, depressed or hopeless? No  Over the past two weeks, have you felt little interest or pleasure in doing things? No Have you lost interest or pleasure in daily life? No Do you often feel hopeless? No Do you cry  easily over simple problems? No   Activities of Daily Living  In your present state of health, do you have any difficulty performing the following activities?:   Driving? No  Managing money? No  Feeding yourself? No  Getting from bed to chair? No  Climbing a flight of stairs? No  Preparing food and eating?: No  Bathing or showering? No  Getting dressed: No  Getting to the toilet? No  Using the toilet:No  Moving around from place to place: No  In the past year have you fallen or had a near fall?:No  Are you sexually active? No  Do you have more than one partner? No  Hearing Difficulties: Yes sometimes Do you often ask people to speak up or repeat themselves?  Yes Do you experience ringing or noises in your ears? No  Do you have difficulty understanding soft or whispered voices?  Yes Do you feel that you have a problem with memory? No Do you often misplace items? No    Home Safety:  Do you have a smoke alarm at your residence?  No Do you have grab bars in the bathroom?  No Do you have throw rugs in your house?  None   Cognitive Testing  Alert? Yes Normal Appearance?Yes  Oriented to person? Yes Place? Yes  Time? Yes  Recall of three objects? Yes  Can perform simple calculations? Yes  Displays appropriate judgment?Yes  Can read the correct time from a watch face?Yes   List the Names of Other Physician/Practitioners you currently use:  See referral list for the physicians patient is currently seeing.     Review of Systems: See above   Objective:     General appearance: Appears stated age and mildly obese  Head: Normocephalic, without obvious abnormality, atraumatic  Eyes: conj clear, EOMi PEERLA  Ears: normal TM's and external ear canals both ears  Nose: Nares normal. Septum midline. Mucosa normal. No drainage or sinus tenderness.   Neck: no adenopathy, no carotid bruit, no JVD, supple, symmetrical, trachea midline and thyroid not enlarged, symmetric, no  tenderness/mass/nodules  No CVA tenderness.  Lungs: Scattered expiratory wheezing Breasts: normal appearance, no masses or tenderness Heart: regular rate and rhythm, S1, S2 normal, no murmur, click, rub or gallop  Abdomen: soft, non-tender; bowel sounds normal; no masses, no organomegaly  Musculoskeletal: ROM normal in all joints, no crepitus, no deformity, Normal muscle strengthen. Back  is symmetric, no curvature. Skin: Skin color, texture, turgor normal. No rashes or lesions  Lymph nodes: Cervical, supraclavicular, and axillary nodes normal.  Neurologic: CN 2 -12 Normal, Normal symmetric reflexes. Normal coordination and gait  Psych: Alert & Oriented x 3, Mood appear stable.    Assessment:    Annual wellness medicare exam   Plan:    During the course of the visit the patient was educated and counseled about appropriate screening and preventive services including:  Flu vaccine      Patient Instructions (the written plan) was given to the patient.  Medicare Attestation  I have personally reviewed:  The patient's medical and social history  Their use of alcohol, tobacco or illicit drugs  Their current medications and supplements  The patient's functional ability including ADLs,fall risks, home safety risks, cognitive, and hearing and visual impairment  Diet and physical activities  Evidence for depression or mood disorders  The patient's weight, height, BMI, and visual acuity have been recorded in the chart. I have made referrals, counseling, and provided education to the patient based on review of the above and I have provided the patient with a written personalized care plan for preventive services.

## 2019-05-23 ENCOUNTER — Other Ambulatory Visit: Payer: Self-pay

## 2019-05-23 ENCOUNTER — Inpatient Hospital Stay (HOSPITAL_BASED_OUTPATIENT_CLINIC_OR_DEPARTMENT_OTHER): Payer: Medicare Other | Admitting: Hematology

## 2019-05-23 ENCOUNTER — Inpatient Hospital Stay: Payer: Medicare Other | Attending: Hematology and Oncology

## 2019-05-23 ENCOUNTER — Inpatient Hospital Stay: Payer: Medicare Other

## 2019-05-23 VITALS — BP 101/42 | HR 73 | Temp 98.0°F | Resp 18 | Ht 59.0 in | Wt 176.0 lb

## 2019-05-23 DIAGNOSIS — E039 Hypothyroidism, unspecified: Secondary | ICD-10-CM | POA: Diagnosis not present

## 2019-05-23 DIAGNOSIS — D469 Myelodysplastic syndrome, unspecified: Secondary | ICD-10-CM

## 2019-05-23 DIAGNOSIS — I110 Hypertensive heart disease with heart failure: Secondary | ICD-10-CM | POA: Diagnosis not present

## 2019-05-23 DIAGNOSIS — Z23 Encounter for immunization: Secondary | ICD-10-CM | POA: Diagnosis not present

## 2019-05-23 DIAGNOSIS — D520 Dietary folate deficiency anemia: Secondary | ICD-10-CM

## 2019-05-23 DIAGNOSIS — Z79899 Other long term (current) drug therapy: Secondary | ICD-10-CM | POA: Diagnosis not present

## 2019-05-23 DIAGNOSIS — E611 Iron deficiency: Secondary | ICD-10-CM

## 2019-05-23 LAB — CUP PACEART REMOTE DEVICE CHECK
Battery Remaining Longevity: 36 mo
Battery Remaining Percentage: 43 %
Brady Statistic RA Percent Paced: 0 %
Brady Statistic RV Percent Paced: 0 %
Date Time Interrogation Session: 20200908055100
HighPow Impedance: 45 Ohm
Implantable Lead Implant Date: 20110221
Implantable Lead Implant Date: 20110221
Implantable Lead Location: 753859
Implantable Lead Location: 753860
Implantable Lead Model: 157
Implantable Lead Model: 5076
Implantable Lead Serial Number: 301957
Implantable Pulse Generator Implant Date: 20110221
Lead Channel Impedance Value: 359 Ohm
Lead Channel Impedance Value: 548 Ohm
Lead Channel Pacing Threshold Amplitude: 0.1 V
Lead Channel Pacing Threshold Amplitude: 0.5 V
Lead Channel Pacing Threshold Pulse Width: 0.4 ms
Lead Channel Pacing Threshold Pulse Width: 0.4 ms
Lead Channel Setting Pacing Amplitude: 2 V
Lead Channel Setting Pacing Amplitude: 2.4 V
Lead Channel Setting Pacing Pulse Width: 0.4 ms
Lead Channel Setting Sensing Sensitivity: 0.6 mV
Pulse Gen Serial Number: 160860

## 2019-05-23 LAB — CMP (CANCER CENTER ONLY)
ALT: 10 U/L (ref 0–44)
AST: 16 U/L (ref 15–41)
Albumin: 4 g/dL (ref 3.5–5.0)
Alkaline Phosphatase: 47 U/L (ref 38–126)
Anion gap: 9 (ref 5–15)
BUN: 17 mg/dL (ref 8–23)
CO2: 29 mmol/L (ref 22–32)
Calcium: 9.4 mg/dL (ref 8.9–10.3)
Chloride: 104 mmol/L (ref 98–111)
Creatinine: 0.89 mg/dL (ref 0.44–1.00)
GFR, Est AFR Am: >60 mL/min (ref >60)
GFR, Estimated: >60 mL/min (ref >60)
Glucose, Bld: 113 mg/dL — ABNORMAL HIGH (ref 70–99)
Potassium: 3.6 mmol/L (ref 3.5–5.1)
Sodium: 142 mmol/L (ref 135–145)
Total Bilirubin: 2.3 mg/dL — ABNORMAL HIGH (ref 0.3–1.2)
Total Protein: 6.1 g/dL — ABNORMAL LOW (ref 6.5–8.1)

## 2019-05-23 LAB — LACTATE DEHYDROGENASE: LDH: 169 U/L (ref 98–192)

## 2019-05-23 LAB — FOLATE: Folate: 44.8 ng/mL (ref >5.9)

## 2019-05-23 LAB — CBC WITH DIFFERENTIAL (CANCER CENTER ONLY)
Abs Immature Granulocytes: 0 10*3/uL (ref 0.00–0.07)
Band Neutrophils: 15 %
Basophils Absolute: 0 10*3/uL (ref 0.0–0.1)
Basophils Relative: 0 %
Blasts: 7 %
Eosinophils Absolute: 0 10*3/uL (ref 0.0–0.5)
Eosinophils Relative: 0 %
HCT: 27.1 % — ABNORMAL LOW (ref 36.0–46.0)
Hemoglobin: 8.6 g/dL — ABNORMAL LOW (ref 12.0–15.0)
Lymphocytes Relative: 39 %
Lymphs Abs: 2.2 10*3/uL (ref 0.7–4.0)
MCH: 29.6 pg (ref 26.0–34.0)
MCHC: 31.7 g/dL (ref 30.0–36.0)
MCV: 93.1 fL (ref 80.0–100.0)
Monocytes Absolute: 0.3 10*3/uL (ref 0.1–1.0)
Monocytes Relative: 5 %
Neutro Abs: 2.8 10*3/uL (ref 1.7–17.7)
Neutrophils Relative %: 34 %
Platelet Count: 69 10*3/uL — ABNORMAL LOW (ref 150–400)
RBC: 2.91 MIL/uL — ABNORMAL LOW (ref 3.87–5.11)
RDW: 21.2 % — ABNORMAL HIGH (ref 11.5–15.5)
WBC Count: 5.7 10*3/uL (ref 4.0–10.5)
nRBC: 50.2 % — ABNORMAL HIGH (ref 0.0–0.2)

## 2019-05-23 LAB — FERRITIN: Ferritin: 418 ng/mL — ABNORMAL HIGH (ref 11–307)

## 2019-05-23 LAB — VITAMIN B12: Vitamin B-12: 2087 pg/mL — ABNORMAL HIGH (ref 180–914)

## 2019-05-23 NOTE — Progress Notes (Signed)
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 female who has been transferred to [place] by Dr. 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 The pt reports that she is doing well overall.  The pt reports that she would not like to restart Vidaza because "her heart is not strong enough," but would consider a lower dose. She had had two transfusions in the past month. The pt has had issues with fluid overload in the past year but has not needed to go to the hospital. Since being in the hospital recently, she has been okay. She has been eating well but reports indigestion.  She lives alone but has friends nearby who are willing to help her.   Today's lab results (04/19/2019) of CBC is as follows: all values are WNL except for RBC at 2.59, HGB at 7.9, HCT at 25.2, RDW at 19.4, PLT at 104k, nRBC at 15.2.  On review of systems, pt reports constant fatigue, indigestion, eating well, past issues of fluid overload and denies belly pain, mouth sores, concerns of infection and any other symptoms.   INTERVAL HISTORY:  [name] is a wonderful 73 y.o. 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 The patient's last visit with [place] was on 04/18/2019. The pt reports that she is doing well overall.  The pt reports that she feels well and has no new concerns at this time. She is interested in getting the Erythropoietin shot but is concerned about the potential side effects. Pt has been eating well and there have been no major changes in her weight.    Lab results today (05/23/19) of CBC w/diff and CMP is as follows: all values are WNL except for RBC at 2.91, Hgb at 8.6, HCT at 27.1, RDW at 21.2, Platelets at 69K, nRBC at 50.2, Glucose at 113, Total Protein at 6.1, Total Bilirubin at 2.3 05/23/2019 LDH at 169  05/23/2019 Ferritin at 418 05/23/2019 Vitamin B12 is in progress 05/23/2019 Haptoglobin is in progress 05/23/2019 Folate at is in progress 05/23/2019 Methylmalonic acid is in progress   On review of systems, pt reports low blood pressure, fatigue and denies unexpected weight loss, abdominal pain, fevers, infection issues, bleeding issues, mouth sores, SOB and any other symptoms.    MEDICAL HISTORY:  Past Medical History:  Diagnosis Date  . Automatic implantable cardioverter-defibrillator in situ    Dr. Beckie Salts follows-next visit- [phone]  . Cataract   . Diverticulosis   . Dyslipidemia   . Ejection fraction < 50%    EF 25-30%, September, 2010  . GERD (gastroesophageal reflux disease)   . Hypothyroidism   . ICD (implantable cardiac defibrillator) in place    10/2009; Dr. Lovena Le  . Iron deficiency 09/21/2018  . LBBB (left bundle branch block)    old  . Macrocytic anemia   . MDS (myelodysplastic syndrome) ([place]) 12/27/2018  . Mitral regurgitation    Mild, echo, September, 2010  . Nonischemic cardiomyopathy ([place])    Normal coronary arteries, catheterization, September, 2009  . Overweight(278.02)   . Serrated polyp  of colon   . Systolic CHF, chronic (HCC)    no recent problems    SURGICAL HISTORY: Past Surgical History:  Procedure Laterality Date  . BIOPSY  08/07/2018   Procedure: BIOPSY;  Surgeon: Jerene Bears, MD;  Location: [facility];  Service: Gastroenterology;;  . CARDIAC CATHETERIZATION     '09 last  . [place]    . COLONOSCOPY N/A 11/29/2013   Procedure: COLONOSCOPY;  Surgeon: Jerene Bears, MD;  Location: [facility];  Service: Gastroenterology;  Laterality: N/A;  . COLONOSCOPY N/A  02/25/2015   Procedure: COLONOSCOPY;  Surgeon: Jerene Bears, MD;  Location: [facility];  Service: Gastroenterology;  Laterality: N/A;  . ESOPHAGOGASTRODUODENOSCOPY (EGD) WITH PROPOFOL N/A 08/07/2018   Procedure: ESOPHAGOGASTRODUODENOSCOPY (EGD) WITH PROPOFOL;  Surgeon: Jerene Bears, MD;  Location: [facility];  Service: Gastroenterology;  Laterality: N/A;  . EYE SURGERY Left    x 3-"droopy eyelid"  . heart catherization    . TONSILLECTOMY  1952    SOCIAL HISTORY: Social History   Socioeconomic History  . Marital status: Single    Spouse name: Not on file  . Number of children: Not on file  . Years of education: Not on file  . Highest education level: Not on file  Occupational History  . Not on file  Social Needs  . Financial resource strain: Not on file  . Food insecurity    Worry: Not on file    Inability: Not on file  . Transportation needs    Medical: Not on file    Non-medical: Not on file  Tobacco Use  . Smoking status: Never Smoker  . Smokeless tobacco: Never Used  Substance and Sexual Activity  . Alcohol use: Yes    Comment: 2-4 beers per week  . Drug use: No  . Sexual activity: Never    Birth control/protection: Abstinence  Lifestyle  . Physical activity    Days per week: Not on file    Minutes per session: Not on file  . Stress: Not on file  Relationships  . Social Herbalist on phone: Not on file    Gets together: Not on file    Attends religious service: Not on file    Active member of club or organization: Not on file    Attends meetings of clubs or organizations: Not on file    Relationship status: Not on file  . Intimate partner violence    Fear of current or ex partner: Not on file    Emotionally abused: Not on file    Physically abused: Not on file    Forced sexual activity: Not on file  Other Topics Concern  . Not on file  Social History Narrative  . Not on file    FAMILY HISTORY: Family History  Problem Relation Age of  Onset  . Heart attack Mother   . Heart disease Mother   . Hypertension Mother   . Lung cancer Father   . Pancreatic cancer Father   . Cancer Father   . Stroke Paternal Grandfather   . Colon cancer Neg Hx   . Diabetes Neg Hx   . Kidney disease Neg Hx   . Liver disease Neg Hx     ALLERGIES:  has No Known Allergies.  MEDICATIONS:  Current Outpatient Medications  Medication Sig Dispense Refill  . albuterol (PROVENTIL HFA;VENTOLIN HFA) 108 (90 Base) MCG/ACT inhaler Inhale 2 puffs into the lungs every 6 (six) hours as needed  for wheezing or shortness of breath. 1 Inhaler prn  . carvedilol (COREG) 25 MG tablet Take 1 tablet (25 mg total) by mouth 2 (two) times daily. 180 tablet 3  . Cyanocobalamin (VITAMIN B-12 PO) Take 1 tablet by mouth daily. gummies    . folic acid (FOLVITE) 1 MG tablet TAKE 1 TABLET BY MOUTH EVERY DAY 90 tablet 0  . furosemide (LASIX) 20 MG tablet Take 1 tablet (20 mg total) by mouth daily. 90 tablet 3  . glucose blood (ACCU-CHEK AVIVA) test strip Check daily.  For Diabetes 250.00 100 each 12  . ipratropium-albuterol (DUONEB) 0.5-2.5 (3) MG/3ML SOLN Take 3 mLs by nebulization every 4 (four) hours as needed (shortness of breath or wheezing). 360 mL 11  . Lancets MISC Check glucose daily.  Diabetes 250.00 100 each 11  . levothyroxine (SYNTHROID) 150 MCG tablet TAKE 1 TABLET (150 MCG TOTAL) BY MOUTH DAILY BEFORE BREAKFAST. 90 tablet 0  . pantoprazole (PROTONIX) 40 MG tablet Take 1 tablet (40 mg total) by mouth daily. 90 tablet 3  . prochlorperazine (COMPAZINE) 10 MG tablet Take 1 tablet (10 mg total) by mouth every 6 (six) hours as needed for nausea or vomiting. 30 tablet 1  . Propylene Glycol (SYSTANE BALANCE) 0.6 % SOLN Place 1 drop into both eyes daily as needed (for dry eyes).    . sacubitril-valsartan (ENTRESTO) 24-26 MG Take 1 tablet by mouth 2 (two) times daily. 60 tablet 2  . spironolactone (ALDACTONE) 25 MG tablet Take 0.5 tablets (12.5 mg total) by mouth daily.  Hold if systolic BP is <725 mmHg 45 tablet 3   No current facility-administered medications for this visit.     REVIEW OF SYSTEMS:    A 10+ POINT REVIEW OF SYSTEMS WAS OBTAINED including neurology, dermatology, psychiatry, cardiac, respiratory, lymph, extremities, GI, GU, Musculoskeletal, constitutional, breasts, reproductive, HEENT.  All pertinent positives are noted in the HPI.  All others are negative.   PHYSICAL EXAMINATION: ECOG PERFORMANCE STATUS: 2 - Symptomatic, <50% confined to bed  . Vitals:   05/23/19 1311  BP: (!) 101/42  Pulse: 73  Resp: 18  Temp: 98 F (36.7 C)  SpO2: 97%   Filed Weights   05/23/19 1311  Weight: 176 lb (79.8 kg)   .Body mass index is 35.55 kg/m.  GENERAL:alert, in no acute distress and comfortable SKIN: no acute rashes, no significant lesions EYES: conjunctiva are pink and non-injected, sclera anicteric OROPHARYNX: MMM, no exudates, no oropharyngeal erythema or ulceration NECK: supple, no JVD LYMPH:  no palpable lymphadenopathy in the cervical, axillary or inguinal regions LUNGS: clear to auscultation b/l with normal respiratory effort HEART: regular rate & rhythm ABDOMEN:  normoactive bowel sounds , non tender, not distended. No palpable hepatosplenomegaly.  Extremity: no pedal edema  PSYCH: alert & oriented x 3 with fluent speech NEURO: no focal motor/sensory deficits  LABORATORY DATA:  I have reviewed the data as listed  . CBC Latest Ref Rng & Units 05/17/2019 05/09/2019 04/25/2019  WBC 3.8 - 10.8 Thousand/uL 4.8 4.0 4.8  Hemoglobin 11.7 - 15.5 g/dL 8.6(L) 8.5(L) 7.9(L)  Hematocrit 35.0 - 45.0 % 27.4(L) 26.5(L) 24.8(L)  Platelets 140 - 400 Thousand/uL 81(L) 93(L) 105(L)    . CMP Latest Ref Rng & Units 05/17/2019 05/09/2019 04/25/2019  Glucose 65 - 99 mg/dL 111(H) 127(H) 135(H)  BUN 7 - 25 mg/dL '15 18 16  '$ Creatinine 0.60 - 0.93 mg/dL 0.74 0.88 0.97  Sodium 135 - 146 mmol/L 141 141 144  Potassium 3.5 -  5.3 mmol/L 3.8 3.7 3.7   Chloride 98 - 110 mmol/L 104 105 103  CO2 20 - 32 mmol/L '31 28 28  '$ Calcium 8.6 - 10.4 mg/dL 9.3 9.2 9.5  Total Protein 6.1 - 8.1 g/dL 5.5(L) 5.8(L) 5.8(L)  Total Bilirubin 0.2 - 1.2 mg/dL 2.4(H) 2.1(H) 2.0(H)  Alkaline Phos 38 - 126 U/L - 47 45  AST 10 - 35 U/L 14 12(L) 13(L)  ALT 6 - 29 U/L '10 10 11   '$ 12/15/2018 Molecular Pathology   12/15/2018 Cytogenetics    RADIOGRAPHIC STUDIES: I have personally reviewed the radiological images as listed and agreed with the findings in the report. Dg Chest 2 View  Result Date: 05/22/2019 CLINICAL DATA:  Wheezing and shortness of breath for 1 week. EXAM: CHEST - 2 VIEW COMPARISON:  Single-view of the chest 02/02/2019. PA and lateral chest 12/30/2017. FINDINGS: Pacing/AICD is unchanged. There is cardiomegaly and vascular congestion. No edema, consolidative process, pneumothorax or effusion. Atherosclerosis noted. No acute or focal bony abnormality. IMPRESSION: No acute disease. Cardiomegaly. Atherosclerosis. Electronically Signed   By: Inge Rise M.D.   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   FISH: NORMAL  NGS done 12/15/2018 showed CBL mutations which are associated with decreased survival in MDS, RUNX1 mutations that are associated with an unfavorable prognosis.   Negative for FLT3, IDH1 and IDH2 mutations.    Pt was treated with Vidaza beginning 01/08/2019 with most recent dose on 01/12/2019.  She had lab monitoring and was treated with blood transfusions.    Pt was admitted on 01/22/2019 and was discharged on 02/08/2019 due to Acute hypoxic respiratory failure secondary to acute on chronic systolic congestive heart failure and acute lung injury: --Initially treated as pneumonia with Rocephin and azithromycin. Pneumonia ruled out.Respiratory virus panel was negative.  --Treated for acute exacerbation of chronic systolic heart failure. Followed by heart  failure team.   Pt desires to remain on observation and will continue to have labs monitored and transfusion as needed.  2.  CHF and SOB.   -Pt is not currently on oxygen. -Pt has defibrillator in place.   07/17/2018 ECHO showed EF 25-30%.   01/28/2019 ECHO showed EF 20-25%.    3.  Anemia. -Due to MDS -Will continue to monitor counts.   4  Hypothyroidism -Follow-up with PCP for monitoring.   5.  HTN.  -Follow-up with PCP.   PLAN: A&P: -Discussed pt labwork today, 05/23/19; all values are WNL except for RBC at 2.91, Hgb at 8.6, HCT at 27.1, RDW at 21.2, Platelets at 69K, nRBC at 50.2, Glucose at 113, Total Protein at 6.1, Total Bilirubin at 2.3. Pt's platelets continue to decrease. LDH and Iron levels look stable. -Discussed 05/23/2019 LDH at 169  -Discussed 05/23/2019 Ferritin at 418 -Discussed 05/23/2019 Vitamin B12 is in progress -Discussed 05/23/2019 Haptoglobin is in progress -Discussed 05/23/2019 Folate at is in progress -Discussed 05/23/2019 Methylmalonic acid is in progress  -Discussed 04/25/2019 Erythropoietin at 89.1 -Pt is interested in supportive care at this time.  -Plan to start pt on Erythropoietin shot in 2 weeks to decrease need for blood transfusions. -Plan for Erythropoietin shot every 2 weeks with labs until pt's Hgb stabilized, would then move to every 4 weeks -Discussed pandemic safety  -No blood transfusion today -Will space out labs to every 2 weeks -Will see back in 2 months   FOLLOW UP: Changing to q2weeks labs, PRBC  transfusion x 1 if needed and Aranesp Plz schedule to start Aranesp q2weeks x6 RTC with Dr Irene Limbo in 8 weeks   No orders of the defined types were placed in this encounter.   The total time spent in the appt was 20 minutes and more than 50% was on counseling and direct patient cares.  All of the patient's questions were answered with apparent satisfaction. The patient knows to call the clinic with any problems, questions or  concerns.    Sullivan Lone MD [place] AAHIVMS [facility] Hematology/Oncology Physician [facility]  (Office):       [phone] (Work cell):  [phone] (Fax):           [phone]  05/23/2019 5:14 AM  I, Yevette Edwards, am acting as a scribe for Dr. Sullivan Lone.   .I have reviewed the above documentation for accuracy and completeness, and I agree with the above. Brunetta Genera MD

## 2019-05-24 ENCOUNTER — Telehealth: Payer: Self-pay | Admitting: Internal Medicine

## 2019-05-24 LAB — HAPTOGLOBIN: Haptoglobin: 27 mg/dL — ABNORMAL LOW (ref 42–346)

## 2019-05-24 NOTE — Telephone Encounter (Signed)
Dr Renold Genta wrote order and I faxed to [facility] Specialist [phone]

## 2019-05-24 NOTE — Telephone Encounter (Signed)
[name] [phone]  [name] called to say that [facility] Specialist needs an order to cancel her oxygen so that they can come out and pick up her oxygen equipment. Phone number [phone] and fax [phone]

## 2019-05-25 ENCOUNTER — Telehealth: Payer: Self-pay | Admitting: Hematology

## 2019-05-25 LAB — METHYLMALONIC ACID, SERUM: Methylmalonic Acid, Quantitative: 187 nmol/L (ref 0–378)

## 2019-05-25 NOTE — Telephone Encounter (Signed)
Scheduled appt per 9/9 los.  Spoke with patient and she is aware of her appt date and time.

## 2019-05-28 ENCOUNTER — Other Ambulatory Visit: Payer: Self-pay | Admitting: Hematology

## 2019-05-28 DIAGNOSIS — D469 Myelodysplastic syndrome, unspecified: Secondary | ICD-10-CM

## 2019-05-28 DIAGNOSIS — Z23 Encounter for immunization: Secondary | ICD-10-CM

## 2019-06-06 ENCOUNTER — Other Ambulatory Visit: Payer: Self-pay | Admitting: Hematology

## 2019-06-06 ENCOUNTER — Encounter: Payer: Self-pay | Admitting: Cardiology

## 2019-06-06 ENCOUNTER — Other Ambulatory Visit: Payer: Self-pay | Admitting: *Deleted

## 2019-06-06 DIAGNOSIS — D469 Myelodysplastic syndrome, unspecified: Secondary | ICD-10-CM

## 2019-06-06 NOTE — Progress Notes (Signed)
Remote ICD transmission.   

## 2019-06-07 ENCOUNTER — Inpatient Hospital Stay: Payer: Medicare Other

## 2019-06-07 ENCOUNTER — Other Ambulatory Visit: Payer: Self-pay

## 2019-06-07 VITALS — BP 109/71 | HR 72 | Temp 98.3°F | Resp 18

## 2019-06-07 DIAGNOSIS — D469 Myelodysplastic syndrome, unspecified: Secondary | ICD-10-CM

## 2019-06-07 DIAGNOSIS — I110 Hypertensive heart disease with heart failure: Secondary | ICD-10-CM | POA: Diagnosis not present

## 2019-06-07 DIAGNOSIS — E039 Hypothyroidism, unspecified: Secondary | ICD-10-CM | POA: Diagnosis not present

## 2019-06-07 DIAGNOSIS — Z23 Encounter for immunization: Secondary | ICD-10-CM | POA: Diagnosis not present

## 2019-06-07 DIAGNOSIS — Z79899 Other long term (current) drug therapy: Secondary | ICD-10-CM | POA: Diagnosis not present

## 2019-06-07 LAB — CBC WITH DIFFERENTIAL (CANCER CENTER ONLY)
Abs Immature Granulocytes: 0.02 10*3/uL (ref 0.00–0.07)
Basophils Absolute: 0 10*3/uL (ref 0.0–0.1)
Basophils Relative: 0 %
Eosinophils Absolute: 0 10*3/uL (ref 0.0–0.5)
Eosinophils Relative: 0 %
HCT: 27.4 % — ABNORMAL LOW (ref 36.0–46.0)
Hemoglobin: 8.7 g/dL — ABNORMAL LOW (ref 12.0–15.0)
Immature Granulocytes: 1 %
Lymphocytes Relative: 45 %
Lymphs Abs: 1.8 10*3/uL (ref 0.7–4.0)
MCH: 29.9 pg (ref 26.0–34.0)
MCHC: 31.8 g/dL (ref 30.0–36.0)
MCV: 94.2 fL (ref 80.0–100.0)
Monocytes Absolute: 0.6 10*3/uL (ref 0.1–1.0)
Monocytes Relative: 16 %
Neutro Abs: 1.5 10*3/uL — ABNORMAL LOW (ref 1.7–7.7)
Neutrophils Relative %: 38 %
Platelet Count: 57 10*3/uL — ABNORMAL LOW (ref 150–400)
RBC: 2.91 MIL/uL — ABNORMAL LOW (ref 3.87–5.11)
RDW: 22.6 % — ABNORMAL HIGH (ref 11.5–15.5)
WBC Count: 3.9 10*3/uL — ABNORMAL LOW (ref 4.0–10.5)
WBC Morphology: 5
nRBC: 21.3 % — ABNORMAL HIGH (ref 0.0–0.2)

## 2019-06-07 LAB — SAMPLE TO BLOOD BANK

## 2019-06-07 MED ORDER — INFLUENZA VAC A&B SA ADJ QUAD 0.5 ML IM PRSY
0.5000 mL | PREFILLED_SYRINGE | Freq: Once | INTRAMUSCULAR | Status: AC
  Administered 2019-06-07: 0.5 mL via INTRAMUSCULAR

## 2019-06-07 MED ORDER — DARBEPOETIN ALFA 200 MCG/0.4ML IJ SOSY
PREFILLED_SYRINGE | INTRAMUSCULAR | Status: AC
  Filled 2019-06-07: qty 0.4

## 2019-06-07 MED ORDER — INFLUENZA VAC A&B SA ADJ QUAD 0.5 ML IM PRSY
PREFILLED_SYRINGE | INTRAMUSCULAR | Status: AC
  Filled 2019-06-07: qty 0.5

## 2019-06-07 MED ORDER — DARBEPOETIN ALFA 200 MCG/0.4ML IJ SOSY
200.0000 ug | PREFILLED_SYRINGE | Freq: Once | INTRAMUSCULAR | Status: AC
  Administered 2019-06-07: 200 ug via SUBCUTANEOUS

## 2019-06-07 NOTE — Addendum Note (Signed)
Addended by: Ihor Gully A on: 06/07/2019 09:48 AM   Modules accepted: Orders

## 2019-06-07 NOTE — Progress Notes (Signed)
Per Dr. Irene Limbo, no blood transfusion today. Ok to give flu shot.

## 2019-06-09 ENCOUNTER — Other Ambulatory Visit: Payer: Self-pay | Admitting: Cardiology

## 2019-06-10 ENCOUNTER — Other Ambulatory Visit: Payer: Self-pay | Admitting: Internal Medicine

## 2019-06-10 NOTE — Patient Instructions (Addendum)
Please have chest x-ray due to wheezing and shortness of breath.  Continue current medications and follow-up with your specialist.  Return in 6 months.  Addendum TSH is low and needs to be repeated.  Also needs urine for microalbumin with history of impaired glucose tolerance

## 2019-06-12 ENCOUNTER — Other Ambulatory Visit: Payer: Self-pay

## 2019-06-12 ENCOUNTER — Other Ambulatory Visit: Payer: Medicare Other | Admitting: Internal Medicine

## 2019-06-12 DIAGNOSIS — R7302 Impaired glucose tolerance (oral): Secondary | ICD-10-CM

## 2019-06-12 DIAGNOSIS — E039 Hypothyroidism, unspecified: Secondary | ICD-10-CM | POA: Diagnosis not present

## 2019-06-13 LAB — MICROALBUMIN / CREATININE URINE RATIO
Creatinine, Urine: 6 mg/dL — ABNORMAL LOW (ref 20–275)
Microalb, Ur: <0.2 mg/dL

## 2019-06-13 LAB — TSH: TSH: 0.41 mIU/L (ref 0.40–4.50)

## 2019-06-21 ENCOUNTER — Inpatient Hospital Stay: Payer: Medicare Other | Attending: Hematology and Oncology

## 2019-06-21 ENCOUNTER — Ambulatory Visit: Payer: Medicare Other

## 2019-06-21 ENCOUNTER — Other Ambulatory Visit: Payer: Self-pay

## 2019-06-21 ENCOUNTER — Inpatient Hospital Stay: Payer: Medicare Other

## 2019-06-21 ENCOUNTER — Other Ambulatory Visit: Payer: Self-pay | Admitting: *Deleted

## 2019-06-21 VITALS — BP 105/83 | HR 73 | Temp 98.0°F | Resp 16

## 2019-06-21 DIAGNOSIS — D469 Myelodysplastic syndrome, unspecified: Secondary | ICD-10-CM

## 2019-06-21 LAB — CBC WITH DIFFERENTIAL (CANCER CENTER ONLY)
Abs Immature Granulocytes: 0.03 10*3/uL (ref 0.00–0.07)
Basophils Absolute: 0 10*3/uL (ref 0.0–0.1)
Basophils Relative: 0 %
Eosinophils Absolute: 0 10*3/uL (ref 0.0–0.5)
Eosinophils Relative: 0 %
HCT: 27.8 % — ABNORMAL LOW (ref 36.0–46.0)
Hemoglobin: 8.9 g/dL — ABNORMAL LOW (ref 12.0–15.0)
Immature Granulocytes: 1 %
Lymphocytes Relative: 43 %
Lymphs Abs: 2 10*3/uL (ref 0.7–4.0)
MCH: 30 pg (ref 26.0–34.0)
MCHC: 32 g/dL (ref 30.0–36.0)
MCV: 93.6 fL (ref 80.0–100.0)
Monocytes Absolute: 0.8 10*3/uL (ref 0.1–1.0)
Monocytes Relative: 16 %
Neutro Abs: 1.9 10*3/uL (ref 1.7–7.7)
Neutrophils Relative %: 40 %
Platelet Count: 48 10*3/uL — ABNORMAL LOW (ref 150–400)
RBC: 2.97 MIL/uL — ABNORMAL LOW (ref 3.87–5.11)
RDW: 23.5 % — ABNORMAL HIGH (ref 11.5–15.5)
WBC Count: 4.7 10*3/uL (ref 4.0–10.5)
WBC Morphology: 5
nRBC: 42.3 % — ABNORMAL HIGH (ref 0.0–0.2)

## 2019-06-21 LAB — SAMPLE TO BLOOD BANK

## 2019-06-21 MED ORDER — DARBEPOETIN ALFA 200 MCG/0.4ML IJ SOSY
200.0000 ug | PREFILLED_SYRINGE | Freq: Once | INTRAMUSCULAR | Status: AC
  Administered 2019-06-21: 200 ug via SUBCUTANEOUS

## 2019-06-21 MED ORDER — DARBEPOETIN ALFA 200 MCG/0.4ML IJ SOSY
PREFILLED_SYRINGE | INTRAMUSCULAR | Status: AC
  Filled 2019-06-21: qty 0.4

## 2019-06-21 NOTE — Patient Instructions (Signed)
Darbepoetin Alfa injection What is this medicine? DARBEPOETIN ALFA (dar be POE e tin AL fa) helps your body make more red blood cells. It is used to treat anemia caused by chronic kidney failure and chemotherapy. This medicine may be used for other purposes; ask your health care provider or pharmacist if you have questions. COMMON BRAND NAME(S): Aranesp What should I tell my health care provider before I take this medicine? They need to know if you have any of these conditions:  blood clotting disorders or history of blood clots  cancer patient not on chemotherapy  cystic fibrosis  heart disease, such as angina, heart failure, or a history of a heart attack  hemoglobin level of 12 g/dL or greater  high blood pressure  low levels of folate, iron, or vitamin B12  seizures  an unusual or allergic reaction to darbepoetin, erythropoietin, albumin, hamster proteins, latex, other medicines, foods, dyes, or preservatives  pregnant or trying to get pregnant  breast-feeding How should I use this medicine? This medicine is for injection into a vein or under the skin. It is usually given by a health care professional in a hospital or clinic setting. If you get this medicine at home, you will be taught how to prepare and give this medicine. Use exactly as directed. Take your medicine at regular intervals. Do not take your medicine more often than directed. It is important that you put your used needles and syringes in a special sharps container. Do not put them in a trash can. If you do not have a sharps container, call your pharmacist or healthcare provider to get one. A special MedGuide will be given to you by the pharmacist with each prescription and refill. Be sure to read this information carefully each time. Talk to your pediatrician regarding the use of this medicine in children. While this medicine may be used in children as young as 1 month of age for selected conditions, precautions do  apply. Overdosage: If you think you have taken too much of this medicine contact a poison control center or emergency room at once. NOTE: This medicine is only for you. Do not share this medicine with others. What if I miss a dose? If you miss a dose, take it as soon as you can. If it is almost time for your next dose, take only that dose. Do not take double or extra doses. What may interact with this medicine? Do not take this medicine with any of the following medications:  epoetin alfa This list may not describe all possible interactions. Give your health care provider a list of all the medicines, herbs, non-prescription drugs, or dietary supplements you use. Also tell them if you smoke, drink alcohol, or use illegal drugs. Some items may interact with your medicine. What should I watch for while using this medicine? Your condition will be monitored carefully while you are receiving this medicine. You may need blood work done while you are taking this medicine. This medicine may cause a decrease in vitamin B6. You should make sure that you get enough vitamin B6 while you are taking this medicine. Discuss the foods you eat and the vitamins you take with your health care professional. What side effects may I notice from receiving this medicine? Side effects that you should report to your doctor or health care professional as soon as possible:  allergic reactions like skin rash, itching or hives, swelling of the face, lips, or tongue  breathing problems  changes in   vision  chest pain  confusion, trouble speaking or understanding  feeling faint or lightheaded, falls  high blood pressure  muscle aches or pains  pain, swelling, warmth in the leg  rapid weight gain  severe headaches  sudden numbness or weakness of the face, arm or leg  trouble walking, dizziness, loss of balance or coordination  seizures (convulsions)  swelling of the ankles, feet, hands  unusually weak or  tired Side effects that usually do not require medical attention (report to your doctor or health care professional if they continue or are bothersome):  diarrhea  fever, chills (flu-like symptoms)  headaches  nausea, vomiting  redness, stinging, or swelling at site where injected This list may not describe all possible side effects. Call your doctor for medical advice about side effects. You may report side effects to FDA at 1-800-FDA-1088. Where should I keep my medicine? Keep out of the reach of children. Store in a refrigerator between 2 and 8 degrees C (36 and 46 degrees F). Do not freeze. Do not shake. Throw away any unused portion if using a single-dose vial. Throw away any unused medicine after the expiration date. NOTE: This sheet is a summary. It may not cover all possible information. If you have questions about this medicine, talk to your doctor, pharmacist, or health care provider.  2020 Elsevier/Gold Standard (2017-09-14 16:44:20)  

## 2019-06-25 NOTE — Progress Notes (Signed)
Cardiology Office Note   Date:  06/26/2019   ID:  [name] 10/31/1945, MRN [id number]  PCP:  Elby Showers, MD  Cardiologist: previous Ron Parker patient , now  [name], MD   Chief Complaint  Patient presents with  . Congestive Heart Failure   Problem List 1. Chronic systolic congestive heart failure 2. ICD 3. Hypothyroidism     [name] is a 73 y.o. female who presents today to follow up cardiomyopathy. She is doing very well. She is not having any chest pain or shortness of breath. She's had no syncope or presyncope. Her ICD is followed carefully by Dr. Lovena Le. I saw her last October, 2015. She had an echo shortly after that. Her EF was in the 30-35% range. This is slightly better than it had been. We have her on the maximum doses of medicines that she can tolerate.  December 09, 2015:  Doing well.    Former patient of Dr. Ron Parker.  No CP ,  Chronic DOE with activity .  No dyspnea with sitting or lying down .   Jan. 8, 2018:  Doing well.  Mild dyspnea when she exerts herself , no CP   Aug. 22, 2018: She has a history of  chronic systolic congestive heart failure. Her last echocardiogram was in 2015 which shows an ejection fraction of 30-35%. She has a defibrillator. Has some dyspnea when walking  Is on Coreg 25 BID, Losartan 25 mg a day , Lasix  She was on Spironolactone but this was stopped when she developed some renal insufficiency while on valsartan plus Spironolactone  Has started doing water aeorbics  Jan 11, 2018:  Previous Ron Parker patient  Hx of CHF ,   Has an ICD  BP is mildly elevated here.   Has been diagnosed with pneumonia by Dr. Renold Genta  - has been started on Abx.  Has prednisone, doxycycline, nebs   Nov. 11, 2019: [name] is a 73 year old female with a history of chronic systolic congestive heart failure.  Recent echocardiogram shows moderate to severe left ventricular dysfunction with an ejection fraction of 25 to 30%.  She has grade 2  diastolic dysfunction.  No syncope,  Has some DOE, No CP  Has some leg fatigue   October 23, 2018:  [name] is seen today for follow up  She and Dr. Lovena Le have decided to wait and upgrade her ICD at a later time  She is breathing better .   She is bening more careful with her salt intake   Has also had anemia  She has not had a sleep study .   March 26, 2019: Seen with [name] ,  Case Manager   [name] is seen back today for follow-up of her congestive heart failure.  She has an ICD.  She was admitted with respiratory failure in May, 2020.  This was thought to be due to acute on chronic systolic congestive heart failure.  She was treated with IV milrinone drip.  She was discharged on Entresto, Aldactone, carvedilol.  She has discussed upgrading her ICD .   Getting some exercise .   Has home PT ,  Some aerobic exercise .   Also has myelodysplastic syndrome. Her decompensation occurred the week following 1 of the treatments.  Oct. 13, 2020  [name] is seen for follow up of CHF.  She has an ICD  Has myelodysplastic syndrome. Hb is around 8 . hasnt needed a transfusion in 8 weeks or  so .   Takes the [facility] and lasix  .  She holds her spironolactone if her blood pressure is too low.  We discussed the fact that I would like for her to take the spironolactone and Entresto on a day but daily basis and then hold the furosemide as needed.   Past Medical History:  Diagnosis Date  . Automatic implantable cardioverter-defibrillator in situ    Dr. Beckie Salts follows-next visit- [phone]  . Cataract   . Diverticulosis   . Dyslipidemia   . Ejection fraction < 50%    EF 25-30%, September, 2010  . GERD (gastroesophageal reflux disease)   . Hypothyroidism   . ICD (implantable cardiac defibrillator) in place    10/2009; Dr. Lovena Le  . Iron deficiency 09/21/2018  . LBBB (left bundle branch block)    old  . Macrocytic anemia   . MDS (myelodysplastic syndrome) ([place]) 12/27/2018  . Mitral  regurgitation    Mild, echo, September, 2010  . Nonischemic cardiomyopathy ([place])    Normal coronary arteries, catheterization, September, 2009  . Overweight(278.02)   . Serrated polyp of colon   . Systolic CHF, chronic (HCC)    no recent problems    Past Surgical History:  Procedure Laterality Date  . BIOPSY  08/07/2018   Procedure: BIOPSY;  Surgeon: Jerene Bears, MD;  Location: [facility];  Service: Gastroenterology;;  . CARDIAC CATHETERIZATION     '09 last  . [place]    . COLONOSCOPY N/A 11/29/2013   Procedure: COLONOSCOPY;  Surgeon: Jerene Bears, MD;  Location: [facility];  Service: Gastroenterology;  Laterality: N/A;  . COLONOSCOPY N/A 02/25/2015   Procedure: COLONOSCOPY;  Surgeon: Jerene Bears, MD;  Location: [facility];  Service: Gastroenterology;  Laterality: N/A;  . ESOPHAGOGASTRODUODENOSCOPY (EGD) WITH PROPOFOL N/A 08/07/2018   Procedure: ESOPHAGOGASTRODUODENOSCOPY (EGD) WITH PROPOFOL;  Surgeon: Jerene Bears, MD;  Location: [facility];  Service: Gastroenterology;  Laterality: N/A;  . EYE SURGERY Left    x 3-"droopy eyelid"  . heart catherization    . TONSILLECTOMY  1952    Patient Active Problem List   Diagnosis Date Noted  . Chronic systolic CHF (congestive heart failure) ([place])     Priority: High  . LBBB (left bundle branch block)     Priority: High  . Dyspnea   . Congestive dilated cardiomyopathy ([place])   . MDS (myelodysplastic syndrome) ([place]) 12/27/2018  . Iron deficiency 09/21/2018  . Symptomatic anemia 08/03/2018  . Impaired glucose tolerance 06/21/2017  . Cough variant asthma  vs UACS from ACEi  12/31/2016  . Essential hypertension 12/31/2016  . Morbid obesity due to excess calories ([place]) 12/31/2016  . History of colonic polyps   . Benign neoplasm of descending colon   . Diverticulosis of colon without hemorrhage 01/28/2014  . Serrated adenoma of colon 01/28/2014  . Nonspecific abnormal finding in stool contents 11/29/2013   . Colon cancer screening 11/29/2013  . History of depression 08/11/2012  . Automatic implantable cardioverter-defibrillator in situ   . Hypothyroidism   . GERD (gastroesophageal reflux disease)   . Dyslipidemia   . Mitral regurgitation   . Overweight   . Ejection fraction < 50%       Current Outpatient Medications  Medication Sig Dispense Refill  . albuterol (PROVENTIL HFA;VENTOLIN HFA) 108 (90 Base) MCG/ACT inhaler Inhale 2 puffs into the lungs every 6 (six) hours as needed for wheezing or shortness of breath. 1 Inhaler prn  . carvedilol (COREG) 25 MG tablet  Take 1 tablet (25 mg total) by mouth 2 (two) times daily. 180 tablet 3  . Cyanocobalamin (VITAMIN B-12 PO) Take 1 tablet by mouth daily. gummies    . ENTRESTO 24-26 MG TAKE 1 TABLET BY MOUTH TWICE A DAY 60 tablet 11  . folic acid (FOLVITE) 1 MG tablet TAKE 1 TABLET BY MOUTH EVERY DAY 90 tablet 0  . furosemide (LASIX) 20 MG tablet Take 1 tablet (20 mg total) by mouth daily. 90 tablet 3  . glucose blood (ACCU-CHEK AVIVA) test strip Check daily.  For Diabetes 250.00 100 each 12  . ipratropium-albuterol (DUONEB) 0.5-2.5 (3) MG/3ML SOLN Take 3 mLs by nebulization every 4 (four) hours as needed (shortness of breath or wheezing). 360 mL 11  . Lancets MISC Check glucose daily.  Diabetes 250.00 100 each 11  . levothyroxine (SYNTHROID) 150 MCG tablet TAKE 1 TABLET (150 MCG TOTAL) BY MOUTH DAILY BEFORE BREAKFAST. 90 tablet 0  . pantoprazole (PROTONIX) 40 MG tablet Take 1 tablet (40 mg total) by mouth daily. 90 tablet 3  . prochlorperazine (COMPAZINE) 10 MG tablet Take 1 tablet (10 mg total) by mouth every 6 (six) hours as needed for nausea or vomiting. 30 tablet 1  . Propylene Glycol (SYSTANE BALANCE) 0.6 % SOLN Place 1 drop into both eyes daily as needed (for dry eyes).    Marland Kitchen spironolactone (ALDACTONE) 25 MG tablet Take 0.5 tablets (12.5 mg total) by mouth daily. Hold if systolic BP is 99991111 mmHg 45 tablet 3   No current facility-administered  medications for this visit.     Allergies:   Patient has no known allergies.    Social History:  The patient  reports that she has never smoked. She has never used smokeless tobacco. She reports current alcohol use. She reports that she does not use drugs.   Family History:  The patient's family history includes Cancer in her father; Heart attack in her mother; Heart disease in her mother; Hypertension in her mother; Lung cancer in her father; Pancreatic cancer in her father; Stroke in her paternal grandfather.   ROS:    As in HPI, otherwise ROS is negative   Physical Exam: Blood pressure (!) 100/52, pulse 78, height 4' 11.75" (1.518 m), weight 171 lb 12.8 oz (77.9 kg), SpO2 96 %.  GEN:  Well nourished, well developed in no acute distress HEENT: Normal NECK: No JVD; No carotid bruits LYMPHATICS: No lymphadenopathy CARDIAC: RRR , no murmurs, rubs, gallops RESPIRATORY:  Clear to auscultation without rales, wheezing or rhonchi  ABDOMEN: Soft, non-tender, non-distended MUSCULOSKELETAL:  No edema; No deformity  SKIN: Warm and dry NEUROLOGIC:  Alert and oriented x 3     EKG:      Recent Labs: 01/28/2019: B Natriuretic Peptide 1,421.8 02/01/2019: Magnesium 2.4 05/23/2019: ALT 10; BUN 17; Creatinine 0.89; Potassium 3.6; Sodium 142 06/12/2019: TSH 0.41 06/21/2019: Hemoglobin 8.9; Platelet Count 48    Lipid Panel    Component Value Date/Time   CHOL 128 05/17/2019 0907   TRIG 106 05/17/2019 0907   HDL 28 (L) 05/17/2019 0907   CHOLHDL 4.6 05/17/2019 0907   VLDL 15 05/03/2017 1021   LDLCALC 80 05/17/2019 0907      Wt Readings from Last 3 Encounters:  06/26/19 171 lb 12.8 oz (77.9 kg)  05/23/19 176 lb (79.8 kg)  05/22/19 176 lb (79.8 kg)      Current medicines are reviewed  The patient understands her medications.     ASSESSMENT AND PLAN:  1. Chronic  systolic congestive heart failure:   She is tolerating the Entresto fairly well.  She takes Lasix 20 mg on a daily basis  and then takes Aldactone 12.5 mg a day but only for systolic blood pressure is 100.  We will change her  instructions to include taking the spironolactone 12.5 mg on a daily basis and then hold Lasix as needed if her blood pressure is too low.  I will see her again in 3 months.  We will get an echocardiogram shortly before her next visit to follow-up with her left-ventricular EF.  2. ICD : Managed by EP.  3. Hypothyroidism -    4.   Anemia:: She has a myelodysplastic disorder.  Hemoglobin is around 8.  This is fairly stable for her.   [name], MD  06/26/2019 10:33 AM    [address],  [place], [place]  [address] Pager [phone] Phone: [phone]; Fax: [phone]

## 2019-06-26 ENCOUNTER — Other Ambulatory Visit: Payer: Self-pay

## 2019-06-26 ENCOUNTER — Encounter: Payer: Self-pay | Admitting: Cardiovascular Disease

## 2019-06-26 ENCOUNTER — Ambulatory Visit (INDEPENDENT_AMBULATORY_CARE_PROVIDER_SITE_OTHER): Payer: Medicare Other | Admitting: Cardiovascular Disease

## 2019-06-26 VITALS — BP 100/52 | HR 78 | Ht 59.75 in | Wt 171.8 lb

## 2019-06-26 DIAGNOSIS — I10 Essential (primary) hypertension: Secondary | ICD-10-CM | POA: Diagnosis not present

## 2019-06-26 DIAGNOSIS — I5022 Chronic systolic (congestive) heart failure: Secondary | ICD-10-CM | POA: Diagnosis not present

## 2019-06-26 MED ORDER — SPIRONOLACTONE 25 MG PO TABS: 12.5000 mg | ORAL_TABLET | Freq: Every day | ORAL | 3 refills | Status: AC

## 2019-06-26 MED ORDER — FUROSEMIDE 20 MG PO TABS: 20.0000 mg | ORAL_TABLET | Freq: Every day | ORAL | 3 refills | Status: DC

## 2019-06-26 NOTE — Patient Instructions (Signed)
Medication Instructions:   If you need a refill on your cardiac medications before your next appointment, please call your pharmacy.   Lab work:  If you have labs (blood work) drawn today and your tests are completely normal, you will receive your results only by: Marland Kitchen MyChart Message (if you have MyChart) OR . A paper copy in the mail If you have any lab test that is abnormal or we need to change your treatment, we will call you to review the results.  Testing/Procedures: Your physician has requested that you have an echocardiogram. Echocardiography is a painless test that uses sound waves to create images of your heart. It provides your doctor with information about the size and shape of your heart and how well your heart's chambers and valves are working. This procedure takes approximately one hour. There are no restrictions for this procedure.  Follow-Up: At [facility], you and your health needs are our priority.  As part of our continuing mission to provide you with exceptional heart care, we have created designated Provider Care Teams.  These Care Teams include your primary Cardiologist (physician) and Advanced Practice Providers (APPs -  Physician Assistants and Nurse Practitioners) who all work together to provide you with the care you need, when you need it. You will need a follow up appointment in:  3 months.  Please call our office 2 months in advance to schedule this appointment.  You may see [name], MD or one of the following Advanced Practice Providers on your designated Care Team: Richardson Dopp, PA-C [place], [place] . Daune Perch, NP

## 2019-07-02 ENCOUNTER — Telehealth: Payer: Self-pay | Admitting: Internal Medicine

## 2019-07-02 DIAGNOSIS — Z20822 Contact with and (suspected) exposure to covid-19: Secondary | ICD-10-CM

## 2019-07-02 DIAGNOSIS — Z20828 Contact with and (suspected) exposure to other viral communicable diseases: Secondary | ICD-10-CM

## 2019-07-02 NOTE — Telephone Encounter (Signed)
[name] [phone]  [name] called to say she may have been exposed to [place], the person is going to be tested today. I advised to call us back once the results are in of her friend, and if they are positive then we could set up virtual visit to discuss then maybe send for testing.

## 2019-07-04 ENCOUNTER — Telehealth: Payer: Self-pay | Admitting: *Deleted

## 2019-07-04 NOTE — Telephone Encounter (Signed)
Patient called - Has appts thursday for lab and possible PRBC. Was around individual w/'flu like' symptoms this week - in a room, no mask part of time. Individual had covid test yesterday, expecting results in 5-7 days. Patient has no symptoms of illness, but wondered if she should keep appt tomorrow. Information given to Dr.Kale. Dr.Kale recommends rescheduling appts in a week so that friend's results will be known. Patient in agreement. Advised to contact office if she becomes weak or short of breath since possible transfusion is delayed. Patient verbalized understanding, stating that at this time, she is not experiencing any weakness.

## 2019-07-05 ENCOUNTER — Ambulatory Visit: Payer: Medicare Other

## 2019-07-05 ENCOUNTER — Telehealth: Payer: Self-pay | Admitting: Hematology

## 2019-07-05 ENCOUNTER — Inpatient Hospital Stay: Payer: Medicare Other

## 2019-07-05 NOTE — Telephone Encounter (Signed)
Scheduled appt per 10/21 sch message - pt aware of appt date and time. Per Patient she doesn't know if she was exposed to someone who had covid - still waiting for friends results . Message sent to RN so she is aware.

## 2019-07-06 ENCOUNTER — Telehealth: Payer: Self-pay

## 2019-07-06 ENCOUNTER — Other Ambulatory Visit: Payer: Self-pay

## 2019-07-06 ENCOUNTER — Encounter: Payer: Self-pay | Admitting: Internal Medicine

## 2019-07-06 ENCOUNTER — Ambulatory Visit (INDEPENDENT_AMBULATORY_CARE_PROVIDER_SITE_OTHER): Payer: Medicare Other | Admitting: Internal Medicine

## 2019-07-06 VITALS — BP 106/56 | Temp 98.8°F | Ht 59.75 in | Wt 171.0 lb

## 2019-07-06 DIAGNOSIS — Z20828 Contact with and (suspected) exposure to other viral communicable diseases: Secondary | ICD-10-CM

## 2019-07-06 DIAGNOSIS — Z20822 Contact with and (suspected) exposure to covid-19: Secondary | ICD-10-CM

## 2019-07-06 DIAGNOSIS — E669 Obesity, unspecified: Secondary | ICD-10-CM | POA: Diagnosis not present

## 2019-07-06 DIAGNOSIS — E1169 Type 2 diabetes mellitus with other specified complication: Secondary | ICD-10-CM

## 2019-07-06 NOTE — Telephone Encounter (Signed)
Patient will go get tested and we will do a virtual at 4:30.

## 2019-07-06 NOTE — Telephone Encounter (Signed)
OK Virtual visit

## 2019-07-06 NOTE — Telephone Encounter (Signed)
Patient called back the person that she was around on Sunday night came back positive for COVID and she wants to get tested. She said is willing to do a virtual visit.

## 2019-07-06 NOTE — Patient Instructions (Signed)
Patient is to stay quarantined with her friend until results of COVID-19 tests are back.

## 2019-07-06 NOTE — Addendum Note (Signed)
Addended by: Mady Haagensen on: 07/06/2019 11:31 AM   Modules accepted: Orders

## 2019-07-06 NOTE — Progress Notes (Signed)
   Subjective:    Patient ID: [name], female    DOB: 11/24/1945, 73 y.o.   MRN: [id number]  HPI 73 year old Female with myelodysplastic syndrome type I with anemia and thrombocytopenia currently being treated by Dr. Irene Limbo and is being observed.  Previously took [place] in the  days Spring but now prefers just to be observed off [place].  Received as needed blood transfusions for significant anemia.  Was hospitalized May 2020 for acute hypoxic respiratory failure thought to be due to chronic systolic congestive heart failure and acute lung injury.  Pneumonia was ruled out.  She was subsequent transferred to a skilled nursing facility for rehab before returning home.  Recently, she has been residing with a friend for the past 2 weeks while her husband is being retired.  Friend's brother dined with them indoors on Sunday, October 25th.  They were not wearing masks while eating.  She was within close proximity to the guest at the dinner table maybe less than 6 feet.  He has subsequently became ill and found down on Tuesday, October 27 that he was positive for COVID-19.  Patient went today to be tested for COVID-19 as did her friend with whom patient is currently residing while her house is being retired.  Currently patient is asymptomatic denying fever chills scratchy throat or cough.  She is high risk for complications of [id number] with a risk score of 9.  She also has mitral regurgitation, nonischemic cardiomyopathy, implantable cardiac defibrillator, hypothyroidism, GE reflux and hyperlipidemia.  History of reactive airways disease with respiratory infections.  Is on Entresto for heart failure.  Due to the Coronavirus pandemic, patient is seen today by interactive audio and video telecommunications.  She is identified using 2 identifiers as [name], a longstanding patient in this practice.  She is agreeable to visit in this format today.  Review of Systems see above     Objective:   Physical Exam She is seen virtually on video with no acute distress without shortness of breath and able to give a clear concise history.  She has no cough during the encounter and reports she is afebrile.       Assessment & Plan:  Close exposure to COVID-19  Plan: Await COVID-19 test result.  Patient and her friend have to quarantine until test results are back.  She has canceled upcoming appointments and so she knows the results of her test.

## 2019-07-06 NOTE — Telephone Encounter (Signed)
Left detailed message.   

## 2019-07-06 NOTE — Telephone Encounter (Signed)
Patient called and stated she had dinner with a friend on Sunday 07/01/19 and that friend has tested positive for COVID with symptoms. Patient stated she only has a runny nose but that is normal for her. Patient stated she is going to get a COVID test today. Dr. Irene Limbo made aware and stated patient appointments should be moved 2 weeks from exposure date unless she has a negative test. Appointments for Monday 10/26 canceled. Patient's next appointment is 07/19/19. Patient will call the office once she gets results to move her appointment up sooner if she is negative.

## 2019-07-07 ENCOUNTER — Telehealth (INDEPENDENT_AMBULATORY_CARE_PROVIDER_SITE_OTHER): Payer: Medicare Other | Admitting: Internal Medicine

## 2019-07-07 DIAGNOSIS — R05 Cough: Secondary | ICD-10-CM

## 2019-07-07 DIAGNOSIS — U071 COVID-19: Secondary | ICD-10-CM

## 2019-07-07 DIAGNOSIS — D469 Myelodysplastic syndrome, unspecified: Secondary | ICD-10-CM

## 2019-07-07 DIAGNOSIS — R059 Cough, unspecified: Secondary | ICD-10-CM

## 2019-07-07 LAB — NOVEL CORONAVIRUS, NAA: SARS-CoV-2, NAA: DETECTED — AB

## 2019-07-07 MED ORDER — DOXYCYCLINE HYCLATE 100 MG PO TABS: 100.0000 mg | ORAL_TABLET | Freq: Two times a day (BID) | ORAL | 0 refills | Status: DC

## 2019-07-07 NOTE — Telephone Encounter (Signed)
Patient has tested positive for Covid 19. Her risk of complications score is 9. She is reportedly afebrile but has developed a cough. I am calling in Doxycycline for her. She is at risk for secondary bacterial infection if she develops pneumonia. She is to monitor pulse ox closely and take temp frequently. To go to ED if symptoms worsen. Needs to quarantine for at least 10 days.

## 2019-07-09 ENCOUNTER — Inpatient Hospital Stay: Payer: Medicare Other

## 2019-07-09 ENCOUNTER — Telehealth: Payer: Self-pay

## 2019-07-09 NOTE — Telephone Encounter (Signed)
Called to check on patient, she said she feels fine she has no symptoms. Her temperature is 98.4 and her oxygen level is 95.

## 2019-07-09 NOTE — Telephone Encounter (Signed)
Faxed [facility] to [facility]

## 2019-07-13 ENCOUNTER — Telehealth: Payer: Self-pay | Admitting: *Deleted

## 2019-07-13 NOTE — Telephone Encounter (Signed)
Contacted patient regarding appts here at [facility] on 11/5 for lab/Dr.Kale/blood transfusion (possible) which need to be rescheduled due to positive Covid test reported on 10/24.  Per patient, [facility] informed her she had to quarantine for 10 days - it is up at midnight Monday night 11/2.  Under current [place] process, she can return to [facility] for treatment 21 days after positive test  providing she is fever/symptom free.

## 2019-07-16 ENCOUNTER — Encounter: Payer: Self-pay | Admitting: Internal Medicine

## 2019-07-16 ENCOUNTER — Telehealth: Payer: Self-pay | Admitting: Internal Medicine

## 2019-07-16 ENCOUNTER — Ambulatory Visit (INDEPENDENT_AMBULATORY_CARE_PROVIDER_SITE_OTHER): Payer: Medicare Other | Admitting: Internal Medicine

## 2019-07-16 ENCOUNTER — Other Ambulatory Visit: Payer: Self-pay

## 2019-07-16 VITALS — BP 101/58 | HR 82 | Temp 98.0°F

## 2019-07-16 DIAGNOSIS — R059 Cough, unspecified: Secondary | ICD-10-CM

## 2019-07-16 DIAGNOSIS — R05 Cough: Secondary | ICD-10-CM

## 2019-07-16 DIAGNOSIS — U071 COVID-19: Secondary | ICD-10-CM

## 2019-07-16 NOTE — Telephone Encounter (Signed)
Appointment scheduled.

## 2019-07-16 NOTE — Progress Notes (Signed)
   Subjective:    Patient ID: [name], female    DOB: Dec 29, 1945, 72 y.o.   MRN: [id number]  HPI 73 year old Female tested positive for Covid-19 late October and was started on Doxycycline October 24. Seen by interactive audio and video telecommunications due to coronavirus pandemic.  She is identified using 2 identifiers as [name], a longstanding patient in this practice and is agreeable to visit in this venue today.  Reports no fever or chills.  Cough is productive.  Chest x-ray has been  recommended.  History of admission for respiratory failure Feb 08, 2019.  History of dilated cardiomyopathy.  Followed by Cardiology.  History of asthma.  History of myelodysplasia.    Review of Systems no new complaints just persistent protracted productive cough.  Reports no wheezing.  Is not in any acute distress.  Pulse oximetry staying within normal range.     Objective:   Physical Exam Reports being afebrile with temperature 98 degrees.  Blood pressure 101/58 pulse 82 pulse oximetry 97% does not appear to be tachypneic.  Very little cough heard during encounter.  Does not appear to be in distress.       Assessment & Plan:  COVID-19 positive  Dilated cardiomyopathy  History of respiratory failure May 2020  Myelodysplasia  Protracted cough despite being treated with doxycycline  Plan: Continue to use inhaler 4 times daily.  Finish course of doxycycline.  Have chest x-ray tomorrow.  Also has DuoNeb nebulizer treatment to use up to 4 times daily if necessary.  Addendum: Chest x-ray November 3 shows subsegmental atelectasis left base but no congestive heart failure or infiltrate.  Continue to monitor symptoms and watch pulse oximetry. She has informed [facility] of Covid-19 dx.

## 2019-07-16 NOTE — Telephone Encounter (Signed)
[name] [phone]  [name] called to say that she will finish her antibiotic tomorrow and she is still coughing. She is wandering if she needs to get another antibiotic.

## 2019-07-16 NOTE — Telephone Encounter (Signed)
Set up virtual visit 

## 2019-07-17 ENCOUNTER — Ambulatory Visit
Admission: RE | Admit: 2019-07-17 | Discharge: 2019-07-17 | Disposition: A | Discharge status: Home / Self Care | Payer: Medicare Other | Source: Ambulatory Visit | Attending: Internal Medicine | Admitting: Internal Medicine

## 2019-07-17 DIAGNOSIS — R05 Cough: Secondary | ICD-10-CM

## 2019-07-17 DIAGNOSIS — R059 Cough, unspecified: Secondary | ICD-10-CM

## 2019-07-17 DIAGNOSIS — R0602 Shortness of breath: Secondary | ICD-10-CM | POA: Diagnosis not present

## 2019-07-17 DIAGNOSIS — U071 COVID-19: Secondary | ICD-10-CM

## 2019-07-18 NOTE — Telephone Encounter (Signed)
Spoke with patient to let her know that schedule message sent for an appt on 11/16 for lab/MD/possible blood transfusion. Missed on 11/5 due to covid status. Advised her to seek immediate medical attention for extreme weakness/dizziness, shortness of breath and/or chest pain between now and appt with Dr.kale. She verbalized understanding.

## 2019-07-19 ENCOUNTER — Inpatient Hospital Stay: Payer: Medicare Other | Admitting: Hematology

## 2019-07-19 ENCOUNTER — Inpatient Hospital Stay: Payer: Medicare Other

## 2019-07-19 ENCOUNTER — Ambulatory Visit: Payer: Medicare Other

## 2019-07-29 NOTE — Patient Instructions (Signed)
Continue albuterol inhaler and finish Doxycycline. Have CXR. Call if symptoms worsen. Can use Duoneb nebulizer treatment. Watch VS and pulse ox

## 2019-07-30 ENCOUNTER — Ambulatory Visit: Payer: Medicare Other

## 2019-07-30 ENCOUNTER — Other Ambulatory Visit: Payer: Medicare Other

## 2019-07-31 ENCOUNTER — Telehealth: Payer: Self-pay

## 2019-07-31 NOTE — Telephone Encounter (Signed)
Called to get an update on patient, she was diagnosed with covid.

## 2019-08-01 ENCOUNTER — Other Ambulatory Visit: Payer: Self-pay | Admitting: *Deleted

## 2019-08-01 DIAGNOSIS — D469 Myelodysplastic syndrome, unspecified: Secondary | ICD-10-CM

## 2019-08-01 NOTE — Progress Notes (Signed)
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 female who has been transferred to [place] by Dr. 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 The pt reports that she is doing well overall.  The pt reports that she would not like to restart Vidaza because "her heart is not strong enough," but would consider a lower dose. She had had two transfusions in the past month. The pt has had issues with fluid overload in the past year but has not needed to go to the hospital. Since being in the hospital recently, she has been okay. She has been eating well but reports indigestion.  She lives alone but has friends nearby who are willing to help her.   Today's lab results (04/19/2019) of CBC is as follows: all values are WNL except for RBC at 2.59, HGB at 7.9, HCT at 25.2, RDW at 19.4, PLT at 104k, nRBC at 15.2.  On review of systems, pt reports constant fatigue, indigestion, eating well, past issues of fluid overload and denies belly pain, mouth sores, concerns of infection and any other symptoms.   INTERVAL HISTORY:  [name] is a wonderful 73 y.o. 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 The patient's last visit with [place] was on 05/23/2019. The pt reports that she is doing well overall.  The pt reports she had COVID a few weeks ago. She was tested because she had been exposed to someone tested positive. She did not have any symptoms other than runny nose.  -She had fluid retention but it was resolved by temporarily increasing lasix   -She hasn't taken any ibuprofen or motrin and has had no bleeding.  Of note since the patient's last visit, pt has had DG Chest 2 View (Accession [id number]) completed on 07/17/2019 with results revealing "Enlargement of cardiac silhouette post AICD.Prominent central pulmonary arteries likely reflecting pulmonary arterial hypertension.Subsegmental atelectasis LEFT base."  Lab results today (08/02/19) of CBC w/diff and CMP is as follows: all values are WNL except for RBC at 3.15, Hemoglobin at 9.8, HCT at 31.4, RDW at 27.0, Platelet count at 13, nRBC at 179.9, Glucose Bld at 122, Clacium at 8.7, Total Protein at 5.7, Total Bilirubin at 3.2, PENDING Sample to Blood Bank and Immature Platelet Fracture  On review of systems, pt reports doing better post positive covid testing and denies fevers, bleeding, new lymph nodes, abdominal pain, and any other symptoms.    MEDICAL HISTORY:  Past Medical History:  Diagnosis Date  . Automatic implantable cardioverter-defibrillator in situ    Dr. Beckie Salts follows-next visit- [phone]  . Cataract   . Diverticulosis   . Dyslipidemia   . Ejection fraction < 50%    EF 25-30%, September, 2010  . GERD (gastroesophageal reflux disease)   . Hypothyroidism   . ICD (implantable cardiac defibrillator) in place    10/2009; Dr. Lovena Le  . Iron deficiency 09/21/2018  . LBBB (left bundle branch block)    old  . Macrocytic anemia   . MDS (myelodysplastic syndrome) ([place]) 12/27/2018  . Mitral regurgitation  Mild, echo, September, 2010  . Nonischemic cardiomyopathy ([place])    Normal coronary arteries, catheterization, September, 2009  . Overweight(278.02)   . Serrated polyp of colon   . Systolic CHF, chronic (HCC)    no recent problems    SURGICAL HISTORY: Past Surgical History:  Procedure Laterality Date  . BIOPSY  08/07/2018   Procedure: BIOPSY;  Surgeon: Jerene Bears, MD;  Location: [facility];  Service: Gastroenterology;;  . CARDIAC CATHETERIZATION      '09 last  . [place]    . COLONOSCOPY N/A 11/29/2013   Procedure: COLONOSCOPY;  Surgeon: Jerene Bears, MD;  Location: [place];  Service: Gastroenterology;  Laterality: N/A;  . COLONOSCOPY N/A 02/25/2015   Procedure: COLONOSCOPY;  Surgeon: Jerene Bears, MD;  Location: [facility];  Service: Gastroenterology;  Laterality: N/A;  . ESOPHAGOGASTRODUODENOSCOPY (EGD) WITH PROPOFOL N/A 08/07/2018   Procedure: ESOPHAGOGASTRODUODENOSCOPY (EGD) WITH PROPOFOL;  Surgeon: Jerene Bears, MD;  Location: [facility];  Service: Gastroenterology;  Laterality: N/A;  . EYE SURGERY Left    x 3-"droopy eyelid"  . heart catherization    . TONSILLECTOMY  1952    SOCIAL HISTORY: Social History   Socioeconomic History  . Marital status: Single    Spouse name: Not on file  . Number of children: Not on file  . Years of education: Not on file  . Highest education level: Not on file  Occupational History  . Not on file  Social Needs  . Financial resource strain: Not on file  . Food insecurity    Worry: Not on file    Inability: Not on file  . Transportation needs    Medical: Not on file    Non-medical: Not on file  Tobacco Use  . Smoking status: Never Smoker  . Smokeless tobacco: Never Used  Substance and Sexual Activity  . Alcohol use: Yes    Comment: 2-4 beers per week  . Drug use: No  . Sexual activity: Never    Birth control/protection: Abstinence  Lifestyle  . Physical activity    Days per week: Not on file    Minutes per session: Not on file  . Stress: Not on file  Relationships  . Social Herbalist on phone: Not on file    Gets together: Not on file    Attends religious service: Not on file    Active member of club or organization: Not on file    Attends meetings of clubs or organizations: Not on file    Relationship status: Not on file  . Intimate partner violence    Fear of current or ex partner: Not on file    Emotionally abused: Not on file     Physically abused: Not on file    Forced sexual activity: Not on file  Other Topics Concern  . Not on file  Social History Narrative  . Not on file    FAMILY HISTORY: Family History  Problem Relation Age of Onset  . Heart attack Mother   . Heart disease Mother   . Hypertension Mother   . Lung cancer Father   . Pancreatic cancer Father   . Cancer Father   . Stroke Paternal Grandfather   . Colon cancer Neg Hx   . Diabetes Neg Hx   . Kidney disease Neg Hx   . Liver disease Neg Hx     ALLERGIES:  has No Known Allergies.  MEDICATIONS:  Current Outpatient Medications  Medication  Sig Dispense Refill  . albuterol (PROVENTIL HFA;VENTOLIN HFA) 108 (90 Base) MCG/ACT inhaler Inhale 2 puffs into the lungs every 6 (six) hours as needed for wheezing or shortness of breath. 1 Inhaler prn  . carvedilol (COREG) 25 MG tablet Take 1 tablet (25 mg total) by mouth 2 (two) times daily. 180 tablet 3  . Cyanocobalamin (VITAMIN B-12 PO) Take 1 tablet by mouth daily. gummies    . doxycycline (VIBRA-TABS) 100 MG tablet Take 1 tablet (100 mg total) by mouth 2 (two) times daily. 20 tablet 0  . ENTRESTO 24-26 MG TAKE 1 TABLET BY MOUTH TWICE A DAY 60 tablet 11  . folic acid (FOLVITE) 1 MG tablet TAKE 1 TABLET BY MOUTH EVERY DAY 90 tablet 0  . furosemide (LASIX) 20 MG tablet Take 1 tablet (20 mg total) by mouth daily. Hold if systolic BP is <505 mmHg 90 tablet 3  . glucose blood (ACCU-CHEK AVIVA) test strip Check daily.  For Diabetes 250.00 100 each 12  . ipratropium-albuterol (DUONEB) 0.5-2.5 (3) MG/3ML SOLN Take 3 mLs by nebulization every 4 (four) hours as needed (shortness of breath or wheezing). 360 mL 11  . Lancets MISC Check glucose daily.  Diabetes 250.00 100 each 11  . levothyroxine (SYNTHROID) 150 MCG tablet TAKE 1 TABLET (150 MCG TOTAL) BY MOUTH DAILY BEFORE BREAKFAST. 90 tablet 0  . pantoprazole (PROTONIX) 40 MG tablet Take 1 tablet (40 mg total) by mouth daily. 90 tablet 3  . prochlorperazine  (COMPAZINE) 10 MG tablet Take 1 tablet (10 mg total) by mouth every 6 (six) hours as needed for nausea or vomiting. 30 tablet 1  . Propylene Glycol (SYSTANE BALANCE) 0.6 % SOLN Place 1 drop into both eyes daily as needed (for dry eyes).    Marland Kitchen spironolactone (ALDACTONE) 25 MG tablet Take 0.5 tablets (12.5 mg total) by mouth daily. 45 tablet 3   No current facility-administered medications for this visit.     REVIEW OF SYSTEMS:    A 10+ POINT REVIEW OF SYSTEMS WAS OBTAINED including neurology, dermatology, psychiatry, cardiac, respiratory, lymph, extremities, GI, GU, Musculoskeletal, constitutional, breasts, reproductive, HEENT.  All pertinent positives are noted in the HPI.  All others are negative.    PHYSICAL EXAMINATION: ECOG FS:2 - Symptomatic, <50% confined to bed  Vitals:   08/02/19 0846  BP: (!) 106/49  Pulse: 75  Resp: 18  Temp: (!) 97 F (36.1 C)  SpO2: 97%   Wt Readings from Last 3 Encounters:  08/02/19 165 lb 9.6 oz (75.1 kg)  07/06/19 171 lb (77.6 kg)  06/26/19 171 lb 12.8 oz (77.9 kg)   Body mass index is 32.61 kg/m.    GENERAL:alert, in no acute distress and comfortable SKIN: no acute rashes, no significant lesions EYES: conjunctiva are pink and non-injected, sclera anicteric OROPHARYNX: MMM, no exudates, no oropharyngeal erythema or ulceration NECK: supple, no JVD LYMPH:  no palpable lymphadenopathy in the cervical, axillary or inguinal regions LUNGS: clear to auscultation b/l with normal respiratory effort HEART: regular rate & rhythm ABDOMEN:  normoactive bowel sounds , non tender, not distended. Extremity: +1 bilateral pedal edema PSYCH: alert & oriented x 3 with fluent speech NEURO: no focal motor/sensory deficits   LABORATORY DATA:  I have reviewed the data as listed  . CBC Latest Ref Rng & Units 08/02/2019 06/21/2019 06/07/2019  WBC 4.0 - 10.5 K/uL 6.0 4.7 3.9(L)  Hemoglobin 12.0 - 15.0 g/dL 9.8(L) 8.9(L) 8.7(L)  Hematocrit 36.0 - 46.0 % 31.4(L)  27.8(L) 27.4(L)  Platelets 150 - 400 K/uL 13(L) 48(L) 57(L)    . CMP Latest Ref Rng & Units 08/02/2019 05/23/2019 05/17/2019  Glucose 70 - 99 mg/dL 122(H) 113(H) 111(H)  BUN 8 - 23 mg/dL '13 17 15  '$ Creatinine 0.44 - 1.00 mg/dL 0.88 0.89 0.74  Sodium 135 - 145 mmol/L 141 142 141  Potassium 3.5 - 5.1 mmol/L 3.7 3.6 3.8  Chloride 98 - 111 mmol/L 99 104 104  CO2 22 - 32 mmol/L '31 29 31  '$ Calcium 8.9 - 10.3 mg/dL 8.7(L) 9.4 9.3  Total Protein 6.5 - 8.1 g/dL 5.7(L) 6.1(L) 5.5(L)  Total Bilirubin 0.3 - 1.2 mg/dL 3.2(H) 2.3(H) 2.4(H)  Alkaline Phos 38 - 126 U/L 46 47 -  AST 15 - 41 U/L '21 16 14  '$ ALT 0 - 44 U/L '21 10 10   '$ 07/17/2019 DG Chest 2 View (Accession [id number])    12/15/2018 Molecular Pathology   12/15/2018 Cytogenetics    RADIOGRAPHIC STUDIES: I have personally reviewed the radiological images as listed and agreed with the findings in the report. Dg Chest 2 View  Result Date: 07/17/2019 CLINICAL DATA:  Shortness of breath, recent diagnosis positive for COVID-19 on 07/06/2019, cough EXAM: CHEST - 2 VIEW COMPARISON:  05/22/2019 FINDINGS: LEFT subclavian AICD with leads projecting at RIGHT atrium and RIGHT ventricle. Enlargement of cardiac silhouette with vascular congestion. Prominent hila unchanged. Mild atelectasis at LEFT base. Remaining lungs clear. No pleural effusion or pneumothorax. Bones demineralized. IMPRESSION: Enlargement of cardiac silhouette post AICD. Prominent central pulmonary arteries likely reflecting pulmonary arterial hypertension. Subsegmental atelectasis LEFT base. Electronically Signed   By: Lavonia Dana M.D.   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   FISH: NORMAL  NGS done 12/15/2018 showed CBL mutations which are associated with decreased survival in MDS, RUNX1 mutations that are associated with an unfavorable prognosis.   Negative for FLT3, IDH1 and IDH2 mutations.    Pt  was treated with Vidaza beginning 01/08/2019 with most recent dose on 01/12/2019.  She had lab monitoring and was treated with blood transfusions.    Pt was admitted on 01/22/2019 and was discharged on 02/08/2019 due to Acute hypoxic respiratory failure secondary to acute on chronic systolic congestive heart failure and acute lung injury: --Initially treated as pneumonia with Rocephin and azithromycin. Pneumonia ruled out.Respiratory virus panel was negative.  --Treated for acute exacerbation of chronic systolic heart failure. Followed by heart failure team.   Pt desires to remain on observation and will continue to have labs monitored and transfusion as needed.  2.  CHF and SOB.   -Pt is not currently on oxygen. -Pt has defibrillator in place.   07/17/2018 ECHO showed EF 25-30%.   01/28/2019 ECHO showed EF 20-25%.    3.  Anemia. -Due to MDS -Will continue to monitor counts.   4  Hypothyroidism -Follow-up with PCP for monitoring.   5.  HTN.  -Follow-up with PCP.   PLAN: -Discussed pt labwork today, 08/02/19;  CBC w/diff and CMP is as follows: all values are WNL except for RBC at 3.15, Hemoglobin at 9.8, HCT at 31.4, RDW at 27.0, Platelet count at 13, nRBC at 179.9, Glucose Bld at 122, Clacium at 8.7, Total Protein at 5.7, Total Bilirubin at 3.2, -Discussed that pt is  3 weeks out from COVID 19 infection.  -08/02/2019 platelets at 13.Possibliy from COVID infection or MDS. Ordered immature platelet fracture for today.  -  Discussed unit of transfused platelets today  -Discussed pt has not had any transfusion in the past few months  -Discussed pts last Aranesp shot on 06/21/2019  -Continue Aranesp shots every two weeks with labs -Discussed monitoring pts weight to track water retention     FOLLOW UP: -Platelet transfusion x 1 unit today -continue labs , Aranesp shot and transfusion 1 unit (as needed) every 2 weeks RTC with Dr Irene Limbo in 8 weeks    No orders of the defined  types were placed in this encounter.   The total time spent in the appt was 20 minutes and more than 50% was on counseling and direct patient cares.  All of the patient's questions were answered with apparent satisfaction. The patient knows to call the clinic with any problems, questions or concerns.     Sullivan Lone MD MS AAHIVMS [facility] Hematology/Oncology Physician [facility]  (Office):       [phone] (Work cell):  [phone] (Fax):           [phone]  08/01/2019 4:53 PM  I, Scot Dock, am acting as a scribe for Dr. Sullivan Lone.   .I have reviewed the above documentation for accuracy and completeness, and I agree with the above. Brunetta Genera MD

## 2019-08-02 ENCOUNTER — Other Ambulatory Visit: Payer: Self-pay

## 2019-08-02 ENCOUNTER — Other Ambulatory Visit: Payer: Self-pay | Admitting: *Deleted

## 2019-08-02 ENCOUNTER — Inpatient Hospital Stay: Payer: Medicare Other

## 2019-08-02 ENCOUNTER — Ambulatory Visit: Payer: Medicare Other

## 2019-08-02 ENCOUNTER — Inpatient Hospital Stay: Payer: Medicare Other | Attending: Hematology and Oncology

## 2019-08-02 ENCOUNTER — Inpatient Hospital Stay (HOSPITAL_BASED_OUTPATIENT_CLINIC_OR_DEPARTMENT_OTHER): Payer: Medicare Other | Admitting: Hematology

## 2019-08-02 VITALS — BP 106/49 | HR 75 | Temp 97.0°F | Resp 18 | Ht 59.75 in | Wt 165.6 lb

## 2019-08-02 VITALS — BP 109/50 | HR 71 | Temp 98.0°F | Resp 18

## 2019-08-02 DIAGNOSIS — D469 Myelodysplastic syndrome, unspecified: Secondary | ICD-10-CM

## 2019-08-02 DIAGNOSIS — D649 Anemia, unspecified: Secondary | ICD-10-CM | POA: Diagnosis not present

## 2019-08-02 DIAGNOSIS — D696 Thrombocytopenia, unspecified: Secondary | ICD-10-CM | POA: Diagnosis not present

## 2019-08-02 LAB — CMP (CANCER CENTER ONLY)
ALT: 21 U/L (ref 0–44)
AST: 21 U/L (ref 15–41)
Albumin: 3.8 g/dL (ref 3.5–5.0)
Alkaline Phosphatase: 46 U/L (ref 38–126)
Anion gap: 11 (ref 5–15)
BUN: 13 mg/dL (ref 8–23)
CO2: 31 mmol/L (ref 22–32)
Calcium: 8.7 mg/dL — ABNORMAL LOW (ref 8.9–10.3)
Chloride: 99 mmol/L (ref 98–111)
Creatinine: 0.88 mg/dL (ref 0.44–1.00)
GFR, Est AFR Am: >60 mL/min (ref >60)
GFR, Estimated: >60 mL/min (ref >60)
Glucose, Bld: 122 mg/dL — ABNORMAL HIGH (ref 70–99)
Potassium: 3.7 mmol/L (ref 3.5–5.1)
Sodium: 141 mmol/L (ref 135–145)
Total Bilirubin: 3.2 mg/dL — ABNORMAL HIGH (ref 0.3–1.2)
Total Protein: 5.7 g/dL — ABNORMAL LOW (ref 6.5–8.1)

## 2019-08-02 LAB — CBC WITH DIFFERENTIAL (CANCER CENTER ONLY)
Abs Immature Granulocytes: 0 10*3/uL (ref 0.00–0.07)
Band Neutrophils: 16 %
Basophils Absolute: 0 10*3/uL (ref 0.0–0.1)
Basophils Relative: 0 %
Blasts: 8 %
Eosinophils Absolute: 0 10*3/uL (ref 0.0–0.5)
Eosinophils Relative: 0 %
HCT: 31.4 % — ABNORMAL LOW (ref 36.0–46.0)
Hemoglobin: 9.8 g/dL — ABNORMAL LOW (ref 12.0–15.0)
Lymphocytes Relative: 39 %
Lymphs Abs: 2.3 10*3/uL (ref 0.7–4.0)
MCH: 31.1 pg (ref 26.0–34.0)
MCHC: 31.2 g/dL (ref 30.0–36.0)
MCV: 99.7 fL (ref 80.0–100.0)
Monocytes Absolute: 0.7 10*3/uL (ref 0.1–1.0)
Monocytes Relative: 12 %
Neutro Abs: 2.5 10*3/uL (ref 1.7–7.7)
Neutrophils Relative %: 25 %
Platelet Count: 13 10*3/uL — ABNORMAL LOW (ref 150–400)
RBC: 3.15 MIL/uL — ABNORMAL LOW (ref 3.87–5.11)
RDW: 27 % — ABNORMAL HIGH (ref 11.5–15.5)
WBC Count: 6 10*3/uL (ref 4.0–10.5)
nRBC: 179.9 % — ABNORMAL HIGH (ref 0.0–0.2)

## 2019-08-02 LAB — SAMPLE TO BLOOD BANK

## 2019-08-02 LAB — IMMATURE PLATELET FRACTION: Immature Platelet Fraction: 17.8 % — ABNORMAL HIGH (ref 1.2–8.6)

## 2019-08-02 MED ORDER — DIPHENHYDRAMINE HCL 25 MG PO CAPS
ORAL_CAPSULE | ORAL | Status: AC
  Filled 2019-08-02: qty 1

## 2019-08-02 MED ORDER — DIPHENHYDRAMINE HCL 25 MG PO CAPS
25.0000 mg | ORAL_CAPSULE | Freq: Once | ORAL | Status: AC
  Administered 2019-08-02: 25 mg via ORAL

## 2019-08-02 MED ORDER — DARBEPOETIN ALFA 200 MCG/0.4ML IJ SOSY
PREFILLED_SYRINGE | INTRAMUSCULAR | Status: AC
  Filled 2019-08-02: qty 0.4

## 2019-08-02 MED ORDER — METHYLPREDNISOLONE SODIUM SUCC 40 MG IJ SOLR
INTRAMUSCULAR | Status: AC
  Filled 2019-08-02: qty 1

## 2019-08-02 MED ORDER — DARBEPOETIN ALFA 200 MCG/0.4ML IJ SOSY
200.0000 ug | PREFILLED_SYRINGE | Freq: Once | INTRAMUSCULAR | Status: AC
  Administered 2019-08-02: 200 ug via SUBCUTANEOUS

## 2019-08-02 MED ORDER — ACETAMINOPHEN 325 MG PO TABS
ORAL_TABLET | ORAL | Status: AC
  Filled 2019-08-02: qty 2

## 2019-08-02 MED ORDER — ACETAMINOPHEN 325 MG PO TABS
650.0000 mg | ORAL_TABLET | Freq: Once | ORAL | Status: AC
  Administered 2019-08-02: 650 mg via ORAL

## 2019-08-02 MED ORDER — METHYLPREDNISOLONE SODIUM SUCC 40 MG IJ SOLR
40.0000 mg | Freq: Once | INTRAMUSCULAR | Status: AC
  Administered 2019-08-02: 40 mg via INTRAVENOUS

## 2019-08-02 MED ORDER — SODIUM CHLORIDE 0.9% IV SOLUTION
250.0000 mL | Freq: Once | INTRAVENOUS | Status: AC
  Administered 2019-08-02: 250 mL via INTRAVENOUS
  Filled 2019-08-02: qty 250

## 2019-08-02 NOTE — Patient Instructions (Signed)

## 2019-08-03 ENCOUNTER — Telehealth: Payer: Self-pay | Admitting: Hematology

## 2019-08-03 LAB — BPAM PLATELET PHERESIS
Blood Product Expiration Date: 202011202359
Blood Product Expiration Date: 202011212359
ISSUE DATE / TIME: 202011191030
Unit Type and Rh: 5100
Unit Type and Rh: 9500

## 2019-08-03 LAB — PREPARE PLATELET PHERESIS
Unit division: 0
Unit division: 0

## 2019-08-03 NOTE — Telephone Encounter (Signed)
Scheduled appt per 11/19 los.  Spoke with pt and she is aware of her appt date and time.

## 2019-08-16 ENCOUNTER — Inpatient Hospital Stay: Payer: Medicare Other

## 2019-08-16 ENCOUNTER — Inpatient Hospital Stay: Payer: Medicare Other | Attending: Hematology and Oncology

## 2019-08-16 ENCOUNTER — Telehealth: Payer: Self-pay | Admitting: *Deleted

## 2019-08-16 ENCOUNTER — Other Ambulatory Visit: Payer: Self-pay | Admitting: *Deleted

## 2019-08-16 ENCOUNTER — Other Ambulatory Visit: Payer: Self-pay

## 2019-08-16 VITALS — BP 102/43 | HR 75 | Temp 98.4°F | Resp 16

## 2019-08-16 DIAGNOSIS — D469 Myelodysplastic syndrome, unspecified: Secondary | ICD-10-CM

## 2019-08-16 DIAGNOSIS — D696 Thrombocytopenia, unspecified: Secondary | ICD-10-CM

## 2019-08-16 LAB — CBC WITH DIFFERENTIAL/PLATELET
Abs Immature Granulocytes: 0.4 10*3/uL — ABNORMAL HIGH (ref 0.00–0.07)
Band Neutrophils: 26 %
Basophils Absolute: 0 10*3/uL (ref 0.0–0.1)
Basophils Relative: 0 %
Blasts: 16 %
Eosinophils Absolute: 0 10*3/uL (ref 0.0–0.5)
Eosinophils Relative: 0 %
HCT: 28.9 % — ABNORMAL LOW (ref 36.0–46.0)
Hemoglobin: 9.3 g/dL — ABNORMAL LOW (ref 12.0–15.0)
Lymphocytes Relative: 19 %
Lymphs Abs: 2.4 10*3/uL (ref 0.7–4.0)
MCH: 31.3 pg (ref 26.0–34.0)
MCHC: 32.2 g/dL (ref 30.0–36.0)
MCV: 97.3 fL (ref 80.0–100.0)
Metamyelocytes Relative: 3 %
Monocytes Absolute: 1 10*3/uL (ref 0.1–1.0)
Monocytes Relative: 8 %
Neutro Abs: 6.7 10*3/uL (ref 1.7–7.7)
Neutrophils Relative %: 28 %
Platelets: 16 10*3/uL — ABNORMAL LOW (ref 150–400)
RBC: 2.97 MIL/uL — ABNORMAL LOW (ref 3.87–5.11)
RDW: 25.9 % — ABNORMAL HIGH (ref 11.5–15.5)
WBC: 12.4 10*3/uL — ABNORMAL HIGH (ref 4.0–10.5)
nRBC: 79.3 % — ABNORMAL HIGH (ref 0.0–0.2)

## 2019-08-16 LAB — SAMPLE TO BLOOD BANK

## 2019-08-16 MED ORDER — ACETAMINOPHEN 325 MG PO TABS
650.0000 mg | ORAL_TABLET | Freq: Once | ORAL | Status: AC
  Administered 2019-08-16: 15:00:00 650 mg via ORAL

## 2019-08-16 MED ORDER — DARBEPOETIN ALFA 200 MCG/0.4ML IJ SOSY
200.0000 ug | PREFILLED_SYRINGE | Freq: Once | INTRAMUSCULAR | Status: AC
  Administered 2019-08-16: 200 ug via SUBCUTANEOUS

## 2019-08-16 MED ORDER — SODIUM CHLORIDE 0.9% IV SOLUTION
250.0000 mL | Freq: Once | INTRAVENOUS | Status: AC
  Administered 2019-08-16: 250 mL via INTRAVENOUS
  Filled 2019-08-16: qty 250

## 2019-08-16 MED ORDER — DIPHENHYDRAMINE HCL 25 MG PO CAPS
ORAL_CAPSULE | ORAL | Status: AC
  Filled 2019-08-16: qty 1

## 2019-08-16 MED ORDER — DIPHENHYDRAMINE HCL 25 MG PO CAPS
25.0000 mg | ORAL_CAPSULE | Freq: Once | ORAL | Status: AC
  Administered 2019-08-16: 25 mg via ORAL

## 2019-08-16 MED ORDER — DARBEPOETIN ALFA 200 MCG/0.4ML IJ SOSY
PREFILLED_SYRINGE | INTRAMUSCULAR | Status: AC
  Filled 2019-08-16: qty 0.4

## 2019-08-16 MED ORDER — ACETAMINOPHEN 325 MG PO TABS
ORAL_TABLET | ORAL | Status: AC
  Filled 2019-08-16: qty 2

## 2019-08-16 NOTE — Patient Instructions (Signed)

## 2019-08-16 NOTE — Telephone Encounter (Signed)
Confirmed blood sample in BB with [place]. Patient has order for 1 unit platelets today - they will start to prepare.

## 2019-08-17 LAB — PATHOLOGIST SMEAR REVIEW

## 2019-08-17 LAB — BPAM PLATELET PHERESIS
Blood Product Expiration Date: 202012052359
ISSUE DATE / TIME: 202012031517
Unit Type and Rh: 2800

## 2019-08-17 LAB — PREPARE PLATELET PHERESIS: Unit division: 0

## 2019-08-21 ENCOUNTER — Ambulatory Visit (INDEPENDENT_AMBULATORY_CARE_PROVIDER_SITE_OTHER): Payer: Medicare Other | Admitting: *Deleted

## 2019-08-21 DIAGNOSIS — I5022 Chronic systolic (congestive) heart failure: Secondary | ICD-10-CM

## 2019-08-22 LAB — CUP PACEART REMOTE DEVICE CHECK
Battery Remaining Longevity: 30 mo
Battery Remaining Percentage: 40 %
Brady Statistic RA Percent Paced: 0 %
Brady Statistic RV Percent Paced: 0 %
Date Time Interrogation Session: 20201208022400
HighPow Impedance: 46 Ohm
Implantable Lead Implant Date: 20110221
Implantable Lead Implant Date: 20110221
Implantable Lead Location: 753859
Implantable Lead Location: 753860
Implantable Lead Model: 157
Implantable Lead Model: 5076
Implantable Lead Serial Number: 301957
Implantable Pulse Generator Implant Date: 20110221
Lead Channel Impedance Value: 335 Ohm
Lead Channel Impedance Value: 579 Ohm
Lead Channel Pacing Threshold Amplitude: 0.1 V
Lead Channel Pacing Threshold Amplitude: 0.5 V
Lead Channel Pacing Threshold Pulse Width: 0.4 ms
Lead Channel Pacing Threshold Pulse Width: 0.4 ms
Lead Channel Setting Pacing Amplitude: 2 V
Lead Channel Setting Pacing Amplitude: 2.4 V
Lead Channel Setting Pacing Pulse Width: 0.4 ms
Lead Channel Setting Sensing Sensitivity: 0.6 mV
Pulse Gen Serial Number: 160860

## 2019-08-27 ENCOUNTER — Other Ambulatory Visit: Payer: Self-pay | Admitting: Internal Medicine

## 2019-08-28 ENCOUNTER — Other Ambulatory Visit: Payer: Self-pay

## 2019-08-28 ENCOUNTER — Ambulatory Visit (HOSPITAL_COMMUNITY): Payer: Medicare Other | Attending: Cardiovascular Disease

## 2019-08-28 DIAGNOSIS — I5022 Chronic systolic (congestive) heart failure: Secondary | ICD-10-CM | POA: Diagnosis present

## 2019-08-28 MED ORDER — PERFLUTREN LIPID MICROSPHERE
1.0000 mL | INTRAVENOUS | Status: AC | PRN
  Administered 2019-08-28: 2 mL via INTRAVENOUS

## 2019-08-29 ENCOUNTER — Other Ambulatory Visit: Payer: Self-pay

## 2019-08-29 ENCOUNTER — Telehealth: Payer: Self-pay | Admitting: Nurse Practitioner

## 2019-08-29 DIAGNOSIS — D469 Myelodysplastic syndrome, unspecified: Secondary | ICD-10-CM

## 2019-08-29 MED ORDER — ENTRESTO 49-51 MG PO TABS: 1.0000 | ORAL_TABLET | Freq: Two times a day (BID) | ORAL | 11 refills | Status: AC

## 2019-08-29 NOTE — Telephone Encounter (Signed)
Spoke with patient and advised her to increase Entresto to 49-51 mg twice daily. She will take 2 tablets of her Entresto 24-26 mg twice daily until finished because she just picked up a new Rx. She agrees to d/c furosemide. She will monitor BP and swelling and will call with concerns prior to her appointment in January with Richardson Dopp, PA. She thanked me for the call.

## 2019-08-29 NOTE — Telephone Encounter (Signed)
-----   Message from Thayer Headings, MD sent at 08/29/2019  5:11 PM EST ----- We have discussed with the CHF team.  They have already seen [name] in consultation and do not have anything further to offer at this time.  We will decrease her Lasix and continue to titrate up her Entresto.  We will continue to follow and will see her in January.

## 2019-08-30 ENCOUNTER — Other Ambulatory Visit: Payer: Self-pay

## 2019-08-30 ENCOUNTER — Other Ambulatory Visit: Payer: Medicare Other

## 2019-08-30 ENCOUNTER — Inpatient Hospital Stay: Payer: Medicare Other

## 2019-08-30 ENCOUNTER — Telehealth: Payer: Self-pay | Admitting: *Deleted

## 2019-08-30 ENCOUNTER — Other Ambulatory Visit: Payer: Self-pay | Admitting: *Deleted

## 2019-08-30 VITALS — BP 105/35 | HR 77 | Temp 99.1°F | Resp 20

## 2019-08-30 DIAGNOSIS — D469 Myelodysplastic syndrome, unspecified: Secondary | ICD-10-CM

## 2019-08-30 DIAGNOSIS — D696 Thrombocytopenia, unspecified: Secondary | ICD-10-CM

## 2019-08-30 LAB — CBC WITH DIFFERENTIAL (CANCER CENTER ONLY)
Abs Immature Granulocytes: 0.55 10*3/uL — ABNORMAL HIGH (ref 0.00–0.07)
Band Neutrophils: 20 %
Basophils Absolute: 0 10*3/uL (ref 0.0–0.1)
Basophils Relative: 0 %
Blasts: 17 %
Eosinophils Absolute: 0 10*3/uL (ref 0.0–0.5)
Eosinophils Relative: 0 %
HCT: 32.3 % — ABNORMAL LOW (ref 36.0–46.0)
Hemoglobin: 10.1 g/dL — ABNORMAL LOW (ref 12.0–15.0)
Lymphocytes Relative: 34 %
Lymphs Abs: 6.2 10*3/uL — ABNORMAL HIGH (ref 0.7–4.0)
MCH: 30.6 pg (ref 26.0–34.0)
MCHC: 31.3 g/dL (ref 30.0–36.0)
MCV: 97.9 fL (ref 80.0–100.0)
Metamyelocytes Relative: 2 %
Monocytes Absolute: 1.3 10*3/uL — ABNORMAL HIGH (ref 0.1–1.0)
Monocytes Relative: 7 %
Myelocytes: 1 %
Neutro Abs: 7.1 10*3/uL (ref 1.7–7.7)
Neutrophils Relative %: 19 %
Platelet Count: 14 10*3/uL — ABNORMAL LOW (ref 150–400)
RBC: 3.3 MIL/uL — ABNORMAL LOW (ref 3.87–5.11)
RDW: 25.2 % — ABNORMAL HIGH (ref 11.5–15.5)
WBC Count: 18.2 10*3/uL — ABNORMAL HIGH (ref 4.0–10.5)
nRBC: 137 % — ABNORMAL HIGH (ref 0.0–0.2)
nRBC: 137 /100 WBC — ABNORMAL HIGH

## 2019-08-30 LAB — SAMPLE TO BLOOD BANK

## 2019-08-30 MED ORDER — DIPHENHYDRAMINE HCL 25 MG PO CAPS
ORAL_CAPSULE | ORAL | Status: AC
  Filled 2019-08-30: qty 1

## 2019-08-30 MED ORDER — DARBEPOETIN ALFA 200 MCG/0.4ML IJ SOSY
PREFILLED_SYRINGE | INTRAMUSCULAR | Status: AC
  Filled 2019-08-30: qty 0.4

## 2019-08-30 MED ORDER — ACETAMINOPHEN 325 MG PO TABS
ORAL_TABLET | ORAL | Status: AC
  Filled 2019-08-30: qty 2

## 2019-08-30 MED ORDER — ACETAMINOPHEN 325 MG PO TABS
650.0000 mg | ORAL_TABLET | Freq: Once | ORAL | Status: AC
  Administered 2019-08-30: 650 mg via ORAL

## 2019-08-30 MED ORDER — DIPHENHYDRAMINE HCL 25 MG PO CAPS
25.0000 mg | ORAL_CAPSULE | Freq: Once | ORAL | Status: AC
  Administered 2019-08-30: 25 mg via ORAL

## 2019-08-30 MED ORDER — SODIUM CHLORIDE 0.9% IV SOLUTION
250.0000 mL | Freq: Once | INTRAVENOUS | Status: AC
  Administered 2019-08-30: 250 mL via INTRAVENOUS
  Filled 2019-08-30: qty 250

## 2019-08-30 MED ORDER — DARBEPOETIN ALFA 200 MCG/0.4ML IJ SOSY
200.0000 ug | PREFILLED_SYRINGE | Freq: Once | INTRAMUSCULAR | Status: AC
  Administered 2019-08-30: 200 ug via SUBCUTANEOUS

## 2019-08-30 NOTE — Patient Instructions (Addendum)
Darbepoetin Alfa injection What is this medicine? DARBEPOETIN ALFA (dar be POE e tin AL fa) helps your body make more red blood cells. It is used to treat anemia caused by chronic kidney failure and chemotherapy. This medicine may be used for other purposes; ask your health care provider or pharmacist if you have questions. COMMON BRAND NAME(S): Aranesp What should I tell my health care provider before I take this medicine? They need to know if you have any of these conditions:  blood clotting disorders or history of blood clots  cancer patient not on chemotherapy  cystic fibrosis  heart disease, such as angina, heart failure, or a history of a heart attack  hemoglobin level of 12 g/dL or greater  high blood pressure  low levels of folate, iron, or vitamin B12  seizures  an unusual or allergic reaction to darbepoetin, erythropoietin, albumin, hamster proteins, latex, other medicines, foods, dyes, or preservatives  pregnant or trying to get pregnant  breast-feeding How should I use this medicine? This medicine is for injection into a vein or under the skin. It is usually given by a health care professional in a hospital or clinic setting. If you get this medicine at home, you will be taught how to prepare and give this medicine. Use exactly as directed. Take your medicine at regular intervals. Do not take your medicine more often than directed. It is important that you put your used needles and syringes in a special sharps container. Do not put them in a trash can. If you do not have a sharps container, call your pharmacist or healthcare provider to get one. A special MedGuide will be given to you by the pharmacist with each prescription and refill. Be sure to read this information carefully each time. Talk to your pediatrician regarding the use of this medicine in children. While this medicine may be used in children as [name] as 1 month of age for selected conditions, precautions do  apply. Overdosage: If you think you have taken too much of this medicine contact a poison control center or emergency room at once. NOTE: This medicine is only for you. Do not share this medicine with others. What if I miss a dose? If you miss a dose, take it as soon as you can. If it is almost time for your next dose, take only that dose. Do not take double or extra doses. What may interact with this medicine? Do not take this medicine with any of the following medications:  epoetin alfa This list may not describe all possible interactions. Give your health care provider a list of all the medicines, herbs, non-prescription drugs, or dietary supplements you use. Also tell them if you smoke, drink alcohol, or use illegal drugs. Some items may interact with your medicine. What should I watch for while using this medicine? Your condition will be monitored carefully while you are receiving this medicine. You may need blood work done while you are taking this medicine. This medicine may cause a decrease in vitamin B6. You should make sure that you get enough vitamin B6 while you are taking this medicine. Discuss the foods you eat and the vitamins you take with your health care professional. What side effects may I notice from receiving this medicine? Side effects that you should report to your doctor or health care professional as soon as possible:  allergic reactions like skin rash, itching or hives, swelling of the face, lips, or tongue  breathing problems  changes in   vision  chest pain  confusion, trouble speaking or understanding  feeling faint or lightheaded, falls  high blood pressure  muscle aches or pains  pain, swelling, warmth in the leg  rapid weight gain  severe headaches  sudden numbness or weakness of the face, arm or leg  trouble walking, dizziness, loss of balance or coordination  seizures (convulsions)  swelling of the ankles, feet, hands  unusually weak or  tired Side effects that usually do not require medical attention (report to your doctor or health care professional if they continue or are bothersome):  diarrhea  fever, chills (flu-like symptoms)  headaches  nausea, vomiting  redness, stinging, or swelling at site where injected This list may not describe all possible side effects. Call your doctor for medical advice about side effects. You may report side effects to FDA at 1-800-FDA-1088. Where should I keep my medicine? Keep out of the reach of children. Store in a refrigerator between 2 and 8 degrees C (36 and 46 degrees F). Do not freeze. Do not shake. Throw away any unused portion if using a single-dose vial. Throw away any unused medicine after the expiration date. NOTE: This sheet is a summary. It may not cover all possible information. If you have questions about this medicine, talk to your doctor, pharmacist, or health care provider.  2020 Elsevier/Gold Standard (2017-09-14 16:44:20)   Blood Transfusion, Adult, Care After This sheet gives you information about how to care for yourself after your procedure. Your doctor may also give you more specific instructions. If you have problems or questions, contact your doctor. Follow these instructions at home:   Take over-the-counter and prescription medicines only as told by your doctor.  Go back to your normal activities as told by your doctor.  Follow instructions from your doctor about how to take care of the area where an IV tube was put into your vein (insertion site). Make sure you: ? Wash your hands with soap and water before you change your bandage (dressing). If there is no soap and water, use hand sanitizer. ? Change your bandage as told by your doctor.  Check your IV insertion site every day for signs of infection. Check for: ? More redness, swelling, or pain. ? More fluid or blood. ? Warmth. ? Pus or a bad smell. Contact a doctor if:  You have more redness,  swelling, or pain around the IV insertion site.  You have more fluid or blood coming from the IV insertion site.  Your IV insertion site feels warm to the touch.  You have pus or a bad smell coming from the IV insertion site.  Your pee (urine) turns pink, red, or brown.  You feel weak after doing your normal activities. Get help right away if:  You have signs of a serious allergic or body defense (immune) system reaction, including: ? Itchiness. ? Hives. ? Trouble breathing. ? Anxiety. ? Pain in your chest or lower back. ? Fever, flushing, and chills. ? Fast pulse. ? Rash. ? Watery poop (diarrhea). ? Throwing up (vomiting). ? Dark pee. ? Serious headache. ? Dizziness. ? Stiff neck. ? Yellow color in your face or the white parts of your eyes (jaundice). Summary  After a blood transfusion, return to your normal activities as told by your doctor.  Every day, check for signs of infection where the IV tube was put into your vein.  Some signs of infection are warm skin, more redness and pain, more fluid or blood, and  pus or a bad smell where the needle went in.  Contact your doctor if you feel weak or have any unusual symptoms. This information is not intended to replace advice given to you by your health care provider. Make sure you discuss any questions you have with your health care provider. Document Released: 09/20/2014 Document Revised: 01/04/2018 Document Reviewed: 04/23/2016 Elsevier Patient Education  2020 [employer].

## 2019-08-30 NOTE — Telephone Encounter (Signed)
[id number]: Contacted by lab - WBC corrected - noted in lab results. Dr. Irene Limbo informed

## 2019-08-30 NOTE — Progress Notes (Signed)
Per MD Irene Limbo, give aranesp injection today

## 2019-09-01 LAB — PREPARE PLATELET PHERESIS: Unit division: 0

## 2019-09-01 LAB — BPAM PLATELET PHERESIS
Blood Product Expiration Date: 202012192359
ISSUE DATE / TIME: 202012171134
Unit Type and Rh: 5100

## 2019-09-03 ENCOUNTER — Telehealth: Payer: Self-pay | Admitting: Cardiovascular Disease

## 2019-09-03 MED ORDER — FUROSEMIDE 40 MG PO TABS: ORAL_TABLET | ORAL | 3 refills | Status: AC

## 2019-09-03 NOTE — Telephone Encounter (Signed)
    Pt c/o swelling: STAT is pt has developed SOB within 24 hours  1) How much weight have you gained and in what time span? 5 pounds in 3 days  2) If swelling, where is the swelling located? ANKLES  3) Are you currently taking a fluid pill? Patient started re-taking Lasix on 12/20  4) Are you currently SOB? Yes, at times  5) Do you have a log of your daily weights (if so, list)? YES  6) Have you gained 3 pounds in a day or 5 pounds in a week? 167.6 today, 165.6, 162.4  7) Have you traveled recently? no

## 2019-09-03 NOTE — Telephone Encounter (Signed)
Spoke with patient who states she has gained 5 lbs in 3 days since stopping furosemide and increasing Entresto to 49-51 mg twice daily.  She took furosemide 40 mg yesterday and today and weight did not increase today. She states she had difficulty voiding after stopping furosemide but has had good urine output since yesterday.  BP today 94/56 mmHg, pulse 79 bpm She took Entresto 24-26 mg yesterday and today.  I advised her to try taking Entresto 49-51 mg BID tomorrow and decrease furosemide to 20 mg tomorrow and Wednesday. I advised I will call her back to check on her on Wednesday. Advised her to monitor BP and to decrease Entresto to 24-26 mg BID if she becomes orthostatic. She verbalized understanding and agreement and thanked me for the call.

## 2019-09-03 NOTE — Telephone Encounter (Signed)
Agree with the plan as outlined by Christen Bame, RN

## 2019-09-05 MED ORDER — CARVEDILOL 12.5 MG PO TABS: 12.5000 mg | ORAL_TABLET | Freq: Two times a day (BID) | ORAL | 3 refills | Status: AC

## 2019-09-05 NOTE — Telephone Encounter (Signed)
Spoke with patient who reports that her BP after arriving home is 87/55 mmHg. I discussed her situation with Dr. Tamala Julian due to Dr. Acie Fredrickson being on vacation this week. He advised patient can reduce carvedilol to 12.5 mg twice daily in order to continue Entresto 49-51 mg twice daily. Patient verbalized understanding and agreement. I advised that it is our goal to improve her heart function but we do not want her to feel poor in the midst of medication changes. She verbalized understanding and agreement and will call back with questions or concerns. She thanked me for the call.

## 2019-09-05 NOTE — Addendum Note (Signed)
Addended by: Emmaline Life on: 09/05/2019 04:41 PM   Modules accepted: Orders

## 2019-09-05 NOTE — Telephone Encounter (Signed)
Called patient to assess how she is feeling since our conversation from Monday. She reports weight is holding stead at 167.6 lb.  She is taking Entresto 49-51 mg twice daily and furosemide 20 mg daily. She states she was very fatigued yesterday and was afraid to go out of the house. Reports BP readings have been low. States she feels better today and is currently out of the house on an errand. She has not checked her BP today.  I advised I will call her later when she returns home to assess BP and HR. She verbalized understanding and agreement with plan.

## 2019-09-10 ENCOUNTER — Encounter: Payer: Self-pay | Admitting: Cardiovascular Disease

## 2019-09-10 NOTE — Telephone Encounter (Signed)
Called and spoke to patient. She states that she has continue to have low BP with systolics in the [id number] even with the changes made on 12/23. Today BP is 86/44 and HR 70. She states that her weight had been up 8 lbs, but the last 2 days her weight has been consistent with the following changes she made to her medicines yesterday:  Decreased entresto to 24-26 mg BID Increased carvedilol to 25 mg BID Taking lasix 40 mg QD Continued spironolactone 12.5 mg QD  She states that she has had some dizziness with position changes. Denies syncope or any other Sx. She states that she has been trying to hydrate and eat, but has had some diarrhea.   Appointment made with Kathyrn Drown, NP for tomorrow at 2:30 PM. Instructed the patient to check BP and hold BP meds for SBP <100. Instructed patient to let us know if her Sx change or worsen. She verbalized understanding and thanked me for the call.

## 2019-09-10 NOTE — Telephone Encounter (Signed)
error 

## 2019-09-10 NOTE — Telephone Encounter (Signed)
Agree with plan to have her come in and see Kathyrn Drown , NP in person for further evalu

## 2019-09-10 NOTE — Telephone Encounter (Signed)
Left message to call back  

## 2019-09-10 NOTE — Progress Notes (Signed)
Cardiology Office Note   Date:  09/10/2019   ID:  [name], DOB 16-Apr-1946, MRN [id number]  PCP:  Elby Showers, MD  Cardiologist: Dr. Acie Fredrickson, MD   No chief complaint on file.   History of Present Illness: [name] is a 73 y.o. female who presents for follow-up of chronic systolic CHF, cardiomyopathy with ICD placement and hypothyroidism.  [name] has a history stated above who initially had an echocardiogram in 2015 which showed an LVEF of 30 to 35% for which an ICD was placed by Dr. Lovena Le.  She was placed on guideline directed HF medications carvedilol, losartan.  At that time, she did not tolerate spironolactone therapy given renal insufficiency.  Repeat echocardiogram from 01/28/2019 with moderate to severe LV dysfunction with an LVEF of 25 to 30% with G2 DD.  Unfortunately, she was admitted 01/2019 with respiratory failure felt to be due to to acute on chronic systolic CHF in which she was treated with IV milrinone and discharged on Entresto, spironolactone and carvedilol.  She was seen in follow-up at which time, upgrading her ICD was discussed.   She was most recently seen 06/26/2019 by Dr. Acie Fredrickson at which time she was noted to be tolerating Entresto fairly well. Her Lasix was on an as-needed basis and was taking spironolactone 12.5 mg only for SBP is greater than 100. Plan was for repeat echocardiogram performed on 08/28/2019 showed stable but low LVEF at 20 to 25% with close follow-up. At that time, her [name] was increased to 49/51.   It appears that she called the office 09/10/19 with c/o of persistently low BP with systolics in the 41L even with the changes made on 12/23 (reduce carvedilol to 12.5 mg twice daily in order to continue Entresto 49-51 mg twice daily). On telephone call, she reported BPs at 86/44 and HR 70. Her weight was noted to have been up 8 lbs, but the last 2 days her weight has been consistent with the following changes she made to her  medicines yesterday which have included decreasing Entresto to 24-26 mg BID, increasing carvedilol to 25 mg BID, adding lasix 40 mg QD and continuing spironolactone 12.5 mg QD.   Today [name] presents with SBPs in the in the 60s to 70s on manual cuff and feels very poorly.  She is without dizziness or syncope however is overwhelmingly fatigued.  Given her symptoms, she did not take her Entresto this morning however proceeded to take her carvedilol and presented to the office for evaluation.  In addition to her hypotension, [name] reports that she has had diarrhea since the increase of her Entresto.  She denies fevers, chills, cough or shortness of breath.  It is unclear if [name] is the cause however is reported as a side effect.  DOD today in office, Dr. Caryl Comes has reviewed her case with me and has met [name] and agrees that proceeding to the ED for IV fluid hydration, full work-up with blood cultures and evaluation for possible septic hypotension as indicated.  Would not restart antihypertensives at this time for full washout.   Past Medical History:  Diagnosis Date  . Automatic implantable cardioverter-defibrillator in situ    Dr. Beckie Salts follows-next visit- [phone]  . Cataract   . Diverticulosis   . Dyslipidemia   . Ejection fraction < 50%    EF 25-30%, September, 2010  . GERD (gastroesophageal reflux disease)   . Hypothyroidism   . ICD (implantable  cardiac defibrillator) in place    10/2009; Dr. Lovena Le  . Iron deficiency 09/21/2018  . LBBB (left bundle branch block)    old  . Macrocytic anemia   . MDS (myelodysplastic syndrome) ([facility]) 12/27/2018  . Mitral regurgitation    Mild, echo, September, 2010  . Nonischemic cardiomyopathy ([facility])    Normal coronary arteries, catheterization, September, 2009  . Overweight(278.02)   . Serrated polyp of colon   . Systolic CHF, chronic (HCC)    no recent problems    Past Surgical History:  Procedure Laterality Date  . BIOPSY   08/07/2018   Procedure: BIOPSY;  Surgeon: Jerene Bears, MD;  Location: [facility];  Service: Gastroenterology;;  . CARDIAC CATHETERIZATION     '09 last  . [place]    . COLONOSCOPY N/A 11/29/2013   Procedure: COLONOSCOPY;  Surgeon: Jerene Bears, MD;  Location: [facility];  Service: Gastroenterology;  Laterality: N/A;  . COLONOSCOPY N/A 02/25/2015   Procedure: COLONOSCOPY;  Surgeon: Jerene Bears, MD;  Location: [facility];  Service: Gastroenterology;  Laterality: N/A;  . ESOPHAGOGASTRODUODENOSCOPY (EGD) WITH PROPOFOL N/A 08/07/2018   Procedure: ESOPHAGOGASTRODUODENOSCOPY (EGD) WITH PROPOFOL;  Surgeon: Jerene Bears, MD;  Location: [facility];  Service: Gastroenterology;  Laterality: N/A;  . EYE SURGERY Left    x 3-"droopy eyelid"  . heart catherization    . TONSILLECTOMY  1952     Current Outpatient Medications  Medication Sig Dispense Refill  . albuterol (PROVENTIL HFA;VENTOLIN HFA) 108 (90 Base) MCG/ACT inhaler Inhale 2 puffs into the lungs every 6 (six) hours as needed for wheezing or shortness of breath. 1 Inhaler prn  . carvedilol (COREG) 12.5 MG tablet Take 1 tablet (12.5 mg total) by mouth 2 (two) times daily. 180 tablet 3  . Cyanocobalamin (VITAMIN B-12 PO) Take 1 tablet by mouth daily. gummies    . doxycycline (VIBRA-TABS) 100 MG tablet Take 1 tablet (100 mg total) by mouth 2 (two) times daily. 20 tablet 0  . folic acid (FOLVITE) 1 MG tablet TAKE 1 TABLET BY MOUTH EVERY DAY 90 tablet 0  . furosemide (LASIX) 40 MG tablet Take 1/2 to 1 tablet daily as needed for swelling and shortness of breath 90 tablet 3  . glucose blood (ACCU-CHEK AVIVA) test strip Check daily.  For Diabetes 250.00 100 each 12  . ipratropium-albuterol (DUONEB) 0.5-2.5 (3) MG/3ML SOLN Take 3 mLs by nebulization every 4 (four) hours as needed (shortness of breath or wheezing). 360 mL 11  . Lancets MISC Check glucose daily.  Diabetes 250.00 100 each 11  . levothyroxine (SYNTHROID) 150  MCG tablet TAKE 1 TABLET (150 MCG TOTAL) BY MOUTH DAILY BEFORE BREAKFAST. 90 tablet 0  . pantoprazole (PROTONIX) 40 MG tablet Take 1 tablet (40 mg total) by mouth daily. 90 tablet 3  . prochlorperazine (COMPAZINE) 10 MG tablet Take 1 tablet (10 mg total) by mouth every 6 (six) hours as needed for nausea or vomiting. 30 tablet 1  . Propylene Glycol (SYSTANE BALANCE) 0.6 % SOLN Place 1 drop into both eyes daily as needed (for dry eyes).    . sacubitril-valsartan (ENTRESTO) 49-51 MG Take 1 tablet by mouth 2 (two) times daily. 60 tablet 11  . spironolactone (ALDACTONE) 25 MG tablet Take 0.5 tablets (12.5 mg total) by mouth daily. 45 tablet 3   No current facility-administered medications for this visit.    Allergies:   Patient has no known allergies.    Social History:  The patient  reports  that she has never smoked. She has never used smokeless tobacco. She reports current alcohol use. She reports that she does not use drugs.   Family History:  The patient's family history includes Cancer in her father; Heart attack in her mother; Heart disease in her mother; Hypertension in her mother; Lung cancer in her father; Pancreatic cancer in her father; Stroke in her paternal grandfather.    ROS:  Please see the history of present illness. Otherwise, review of systems are positive for none.   All other systems are reviewed and negative.    PHYSICAL EXAM: VS:  There were no vitals taken for this visit. , BMI There is no height or weight on file to calculate BMI.   General: Ill-appearing, NAD Skin: Warm, dry, intact, poor skin turgor  Lungs:Clear to ausculation bilaterally. No wheezes Breathing is unlabored. Cardiovascular: RRR with S1 S2.  Extremities: No edema. No clubbing or cyanosis. DP pulses 1+ bilaterally Neuro: Alert and oriented. No focal deficits. No facial asymmetry. MAE spontaneously. Psych: Responds to questions appropriately with normal affect.     EKG:  EKG is not ordered  today.   Recent Labs: 01/28/2019: B Natriuretic Peptide 1,421.8 02/01/2019: Magnesium 2.4 06/12/2019: TSH 0.41 08/02/2019: ALT 21; BUN 13; Creatinine 0.88; Potassium 3.7; Sodium 141 08/30/2019: Hemoglobin 10.1; Platelet Count 14    Lipid Panel    Component Value Date/Time   CHOL 128 05/17/2019 0907   TRIG 106 05/17/2019 0907   HDL 28 (L) 05/17/2019 0907   CHOLHDL 4.6 05/17/2019 0907   VLDL 15 05/03/2017 1021   LDLCALC 80 05/17/2019 0907      Wt Readings from Last 3 Encounters:  08/02/19 165 lb 9.6 oz (75.1 kg)  07/06/19 171 lb (77.6 kg)  06/26/19 171 lb 12.8 oz (77.9 kg)    Other studies Reviewed: Additional studies/ records that were reviewed today include:   Echocardiogram 08/28/2019:  Left ventricular ejection fraction, by visual estimation, is 20 to 25%. The left ventricle has severely decreased function. There is no left ventricular hypertrophy. 2. Basal and mid anterior septum and basal and mid inferior septum are abnormal. 3. Definity contrast agent was given IV to delineate the left ventricular endocardial borders. 4. Abnormal septal motion consistent with left bundle branch block. 5. Elevated left atrial pressure. 6. Left ventricular diastolic parameters are consistent with Grade II diastolic dysfunction (pseudonormalization). 7. Severely dilated left ventricular internal cavity size. 8. The left ventricle demonstrates global hypokinesis. 9. No LV thrombus on contrast imaging. 10. Global right ventricle has normal systolic function.The right ventricular size is moderately enlarged. No increase in right ventricular wall thickness. 11. Left atrial size was severely dilated. 12. Right atrial size was normal. 13. Trivial pericardial effusion is present. 14. The mitral valve is degenerative. Moderate to severe mitral valve regurgitation. 15. The tricuspid valve is grossly normal. Tricuspid valve regurgitation is trivial. 16. The aortic valve is tricuspid. Aortic  valve regurgitation is trivial. No evidence of aortic valve sclerosis or stenosis. 17. Pulmonic regurgitation is mild. 18. The pulmonic valve was grossly normal. Pulmonic valve regurgitation is mild. 19. Normal pulmonary artery systolic pressure. 20. The tricuspid regurgitant velocity is 2.26 m/s, and with an assumed right atrial pressure of 3 mmHg, the estimated right ventricular systolic pressure is normal at 23.4 mmHg. 21. A pacer wire is visualized in the RA and RV. 22. The inferior vena cava is normal in size with greater than 50% respiratory variability, suggesting right atrial pressure of 3 mmHg. 23. A  prior study was performed on 01/28/2019. 24. No significant change from prior study  Echocardiogram 01/28/2019:   1. The left ventricle has severely reduced systolic function, with an ejection fraction of 20-25%. The cavity size was severely dilated. Left ventricular diastolic Doppler parameters are consistent with pseudonormalization. Elevated left ventricular  end-diastolic pressure Left ventricular diffuse hypokinesis. No evidence of LV thrombus by definity contrast study.  2. The right ventricle has normal systolic function. The cavity was normal. There is no increase in right ventricular wall thickness. Right ventricular systolic pressure is moderately elevated with an estimated pressure of 54.9 mmHg.  3. Left atrial size was moderately dilated.  4. There is mild mitral annular calcification present. Mitral valve regurgitation is mild by color flow Doppler.  5. The aortic valve is tricuspid. There is trivial AR.  6. The inferior vena cava was dilated in size with <50% respiratory variability.  ASSESSMENT AND PLAN:  1. Hypotension with known chronic systolic CHF: -Repeat echocardiogram from 08/28/2019 with low but stable LVEF at 20 to 25% therefore patients Entresto was increased to 49/51 -Most recently having issues with hypotension>>multiple phone encounters to include reducing  Entresto and adjustments to carvedilol and Lasix dosing>>> patient not tolerating well with SBP's in the 60s to 70s on exam today -Hold all antihypertensives for now for full washout -EMS to transfer patient to ED for gentle fluid resuscitation  2.  Diarrhea: -Reports a 2-week history of diarrhea which initially started with Entresto titration to 49/51 however in the setting of hypotension may need further work-up with blood cultures/lab work-up to rule out infectious process -Denies fevers, chills, cough  3.  Status post ICD placement: -In the setting of severe LV dysfunction -Managed by EP  4.  Myelodysplastic disorder: -Hemoglobin, 10.1 on 08/30/2019 -Patient reports this is stable  Current medicines are reviewed at length with the patient today.  The patient does not have concerns regarding medicines.  The following changes have been made:  no change  Labs/ tests ordered today include: TO ED  No orders of the defined types were placed in this encounter.  Disposition:   FU with Dr. Acie Fredrickson in 2 weeks  Signed, Kathyrn Drown, NP  09/10/2019 12:54 PM    [address], [place], [place]  [address] Phone: [phone]; Fax: [phone]

## 2019-09-10 NOTE — Telephone Encounter (Signed)
Follow Up  Patient is calling in to go over medicine changes. States that medication is not working or helping with the swelling and SOB on exertion. Patient states that her bp is still very low 86/44 currently. States that she does not have any lightheadedness or dizziness. Please give patient a call back to discuss.

## 2019-09-10 NOTE — Telephone Encounter (Signed)
Patient returning call.

## 2019-09-11 ENCOUNTER — Encounter: Payer: Self-pay | Admitting: Cardiology

## 2019-09-11 ENCOUNTER — Inpatient Hospital Stay (HOSPITAL_COMMUNITY)
Admission: EM | Admit: 2019-09-11 | Discharge: 2019-10-15 | DRG: 871 | Disposition: E | Payer: Medicare Other | Source: Ambulatory Visit | Attending: Pulmonary Disease | Admitting: Pulmonary Disease

## 2019-09-11 ENCOUNTER — Other Ambulatory Visit: Payer: Self-pay

## 2019-09-11 ENCOUNTER — Ambulatory Visit (INDEPENDENT_AMBULATORY_CARE_PROVIDER_SITE_OTHER): Payer: Medicare Other | Admitting: Cardiology

## 2019-09-11 ENCOUNTER — Encounter (HOSPITAL_COMMUNITY): Payer: Self-pay | Admitting: Emergency Medicine

## 2019-09-11 ENCOUNTER — Emergency Department (HOSPITAL_COMMUNITY): Payer: Medicare Other

## 2019-09-11 VITALS — BP 63/39 | HR 72 | Ht 59.5 in | Wt 170.8 lb

## 2019-09-11 DIAGNOSIS — R6521 Severe sepsis with septic shock: Secondary | ICD-10-CM | POA: Diagnosis present

## 2019-09-11 DIAGNOSIS — R579 Shock, unspecified: Secondary | ICD-10-CM | POA: Diagnosis not present

## 2019-09-11 DIAGNOSIS — U071 COVID-19: Secondary | ICD-10-CM | POA: Diagnosis not present

## 2019-09-11 DIAGNOSIS — A4189 Other specified sepsis: Principal | ICD-10-CM | POA: Diagnosis present

## 2019-09-11 DIAGNOSIS — I110 Hypertensive heart disease with heart failure: Secondary | ICD-10-CM | POA: Diagnosis present

## 2019-09-11 DIAGNOSIS — K8020 Calculus of gallbladder without cholecystitis without obstruction: Secondary | ICD-10-CM | POA: Diagnosis not present

## 2019-09-11 DIAGNOSIS — K219 Gastro-esophageal reflux disease without esophagitis: Secondary | ICD-10-CM | POA: Diagnosis present

## 2019-09-11 DIAGNOSIS — N179 Acute kidney failure, unspecified: Secondary | ICD-10-CM | POA: Diagnosis present

## 2019-09-11 DIAGNOSIS — I313 Pericardial effusion (noninflammatory): Secondary | ICD-10-CM | POA: Diagnosis present

## 2019-09-11 DIAGNOSIS — Z801 Family history of malignant neoplasm of trachea, bronchus and lung: Secondary | ICD-10-CM

## 2019-09-11 DIAGNOSIS — I428 Other cardiomyopathies: Secondary | ICD-10-CM

## 2019-09-11 DIAGNOSIS — I9589 Other hypotension: Secondary | ICD-10-CM

## 2019-09-11 DIAGNOSIS — E1169 Type 2 diabetes mellitus with other specified complication: Secondary | ICD-10-CM | POA: Diagnosis present

## 2019-09-11 DIAGNOSIS — I340 Nonrheumatic mitral (valve) insufficiency: Secondary | ICD-10-CM | POA: Diagnosis present

## 2019-09-11 DIAGNOSIS — J9601 Acute respiratory failure with hypoxia: Secondary | ICD-10-CM | POA: Diagnosis not present

## 2019-09-11 DIAGNOSIS — I447 Left bundle-branch block, unspecified: Secondary | ICD-10-CM | POA: Diagnosis present

## 2019-09-11 DIAGNOSIS — I959 Hypotension, unspecified: Secondary | ICD-10-CM | POA: Diagnosis not present

## 2019-09-11 DIAGNOSIS — E861 Hypovolemia: Secondary | ICD-10-CM

## 2019-09-11 DIAGNOSIS — D696 Thrombocytopenia, unspecified: Secondary | ICD-10-CM | POA: Diagnosis not present

## 2019-09-11 DIAGNOSIS — Z66 Do not resuscitate: Secondary | ICD-10-CM | POA: Diagnosis not present

## 2019-09-11 DIAGNOSIS — E785 Hyperlipidemia, unspecified: Secondary | ICD-10-CM | POA: Diagnosis present

## 2019-09-11 DIAGNOSIS — Z95810 Presence of automatic (implantable) cardiac defibrillator: Secondary | ICD-10-CM

## 2019-09-11 DIAGNOSIS — Z8249 Family history of ischemic heart disease and other diseases of the circulatory system: Secondary | ICD-10-CM

## 2019-09-11 DIAGNOSIS — I5022 Chronic systolic (congestive) heart failure: Secondary | ICD-10-CM | POA: Diagnosis present

## 2019-09-11 DIAGNOSIS — K5732 Diverticulitis of large intestine without perforation or abscess without bleeding: Secondary | ICD-10-CM | POA: Diagnosis present

## 2019-09-11 DIAGNOSIS — J9600 Acute respiratory failure, unspecified whether with hypoxia or hypercapnia: Secondary | ICD-10-CM

## 2019-09-11 DIAGNOSIS — R000 Tachycardia, unspecified: Secondary | ICD-10-CM | POA: Diagnosis not present

## 2019-09-11 DIAGNOSIS — E669 Obesity, unspecified: Secondary | ICD-10-CM | POA: Diagnosis present

## 2019-09-11 DIAGNOSIS — D469 Myelodysplastic syndrome, unspecified: Secondary | ICD-10-CM | POA: Diagnosis present

## 2019-09-11 DIAGNOSIS — Z800 Family history of malignant neoplasm of digestive organs: Secondary | ICD-10-CM

## 2019-09-11 DIAGNOSIS — Z79890 Hormone replacement therapy (postmenopausal): Secondary | ICD-10-CM

## 2019-09-11 DIAGNOSIS — E860 Dehydration: Secondary | ICD-10-CM | POA: Diagnosis present

## 2019-09-11 DIAGNOSIS — R0602 Shortness of breath: Secondary | ICD-10-CM | POA: Diagnosis not present

## 2019-09-11 DIAGNOSIS — E875 Hyperkalemia: Secondary | ICD-10-CM | POA: Diagnosis present

## 2019-09-11 DIAGNOSIS — Z6841 Body Mass Index (BMI) 40.0 and over, adult: Secondary | ICD-10-CM

## 2019-09-11 DIAGNOSIS — E039 Hypothyroidism, unspecified: Secondary | ICD-10-CM | POA: Diagnosis present

## 2019-09-11 DIAGNOSIS — Z823 Family history of stroke: Secondary | ICD-10-CM

## 2019-09-11 LAB — COMPREHENSIVE METABOLIC PANEL
ALT: 36 U/L (ref 0–44)
AST: 50 U/L — ABNORMAL HIGH (ref 15–41)
Albumin: 2.5 g/dL — ABNORMAL LOW (ref 3.5–5.0)
Alkaline Phosphatase: 73 U/L (ref 38–126)
Anion gap: 11 (ref 5–15)
BUN: 71 mg/dL — ABNORMAL HIGH (ref 8–23)
CO2: 16 mmol/L — ABNORMAL LOW (ref 22–32)
Calcium: 7 mg/dL — ABNORMAL LOW (ref 8.9–10.3)
Chloride: 107 mmol/L (ref 98–111)
Creatinine, Ser: 1.93 mg/dL — ABNORMAL HIGH (ref 0.44–1.00)
GFR calc Af Amer: 29 mL/min — ABNORMAL LOW (ref >60)
GFR calc non Af Amer: 25 mL/min — ABNORMAL LOW (ref >60)
Glucose, Bld: 100 mg/dL — ABNORMAL HIGH (ref 70–99)
Potassium: 6.8 mmol/L (ref 3.5–5.1)
Sodium: 134 mmol/L — ABNORMAL LOW (ref 135–145)
Total Bilirubin: 1.8 mg/dL — ABNORMAL HIGH (ref 0.3–1.2)
Total Protein: 4.2 g/dL — ABNORMAL LOW (ref 6.5–8.1)

## 2019-09-11 LAB — URINALYSIS, ROUTINE W REFLEX MICROSCOPIC
Bilirubin Urine: NEGATIVE
Glucose, UA: NEGATIVE mg/dL
Hgb urine dipstick: NEGATIVE
Ketones, ur: NEGATIVE mg/dL
Leukocytes,Ua: NEGATIVE
Nitrite: NEGATIVE
Protein, ur: NEGATIVE mg/dL
Specific Gravity, Urine: 1.006 (ref 1.005–1.030)
pH: 5 (ref 5.0–8.0)

## 2019-09-11 LAB — CBC WITH DIFFERENTIAL/PLATELET
Basophils Absolute: 0 10*3/uL (ref 0.0–0.1)
Basophils Relative: 0 %
Eosinophils Absolute: 0 10*3/uL (ref 0.0–0.5)
Eosinophils Relative: 0 %
HCT: 25.9 % — ABNORMAL LOW (ref 36.0–46.0)
Hemoglobin: 8.4 g/dL — ABNORMAL LOW (ref 12.0–15.0)
Lymphocytes Relative: 34 %
Lymphs Abs: 21.4 10*3/uL — ABNORMAL HIGH (ref 0.7–4.0)
MCH: 30 pg (ref 26.0–34.0)
MCHC: 32.4 g/dL (ref 30.0–36.0)
MCV: 92.5 fL (ref 80.0–100.0)
Monocytes Absolute: 17 10*3/uL — ABNORMAL HIGH (ref 0.1–1.0)
Monocytes Relative: 27 %
Neutro Abs: 24.4 10*3/uL — ABNORMAL HIGH (ref 1.7–7.7)
Neutrophils Relative %: 39 %
Platelets: 10 10*3/uL — CL (ref 150–400)
RBC: 2.8 MIL/uL — ABNORMAL LOW (ref 3.87–5.11)
RDW: 25.8 % — ABNORMAL HIGH (ref 11.5–15.5)
WBC: 62.8 10*3/uL (ref 4.0–10.5)
nRBC: 20.2 % — ABNORMAL HIGH (ref 0.0–0.2)

## 2019-09-11 LAB — APTT: aPTT: 42 seconds — ABNORMAL HIGH (ref 24–36)

## 2019-09-11 LAB — RESPIRATORY PANEL BY RT PCR (FLU A&B, COVID)
Influenza A by PCR: NEGATIVE
Influenza B by PCR: NEGATIVE
SARS Coronavirus 2 by RT PCR: POSITIVE — AB

## 2019-09-11 LAB — PROTIME-INR
INR: 1.9 — ABNORMAL HIGH (ref 0.8–1.2)
Prothrombin Time: 21.8 seconds — ABNORMAL HIGH (ref 11.4–15.2)

## 2019-09-11 LAB — LACTIC ACID, PLASMA
Lactic Acid, Venous: 1.1 mmol/L (ref 0.5–1.9)
Lactic Acid, Venous: 1 mmol/L (ref 0.5–1.9)

## 2019-09-11 LAB — TROPONIN I (HIGH SENSITIVITY)
Troponin I (High Sensitivity): 5 ng/L (ref <18)
Troponin I (High Sensitivity): 5 ng/L (ref <18)

## 2019-09-11 LAB — POTASSIUM: Potassium: 5.1 mmol/L (ref 3.5–5.1)

## 2019-09-11 LAB — POC OCCULT BLOOD, ED: Fecal Occult Bld: POSITIVE — AB

## 2019-09-11 LAB — LIPASE, BLOOD: Lipase: 43 U/L (ref 11–51)

## 2019-09-11 LAB — PREPARE RBC (CROSSMATCH)

## 2019-09-11 LAB — BRAIN NATRIURETIC PEPTIDE: B Natriuretic Peptide: 307.2 pg/mL — ABNORMAL HIGH (ref 0.0–100.0)

## 2019-09-11 LAB — ABO/RH: ABO/RH(D): O POS

## 2019-09-11 MED ORDER — SODIUM CHLORIDE 0.9 % IV BOLUS
500.0000 mL | Freq: Once | INTRAVENOUS | Status: AC
  Administered 2019-09-11: 500 mL via INTRAVENOUS

## 2019-09-11 MED ORDER — SODIUM CHLORIDE 0.9 % IV SOLN: INTRAVENOUS | Status: DC

## 2019-09-11 MED ORDER — SODIUM CHLORIDE 0.9 % IV BOLUS
1000.0000 mL | Freq: Once | INTRAVENOUS | Status: AC
  Administered 2019-09-11: 1000 mL via INTRAVENOUS

## 2019-09-11 MED ORDER — METRONIDAZOLE IN NACL 5-0.79 MG/ML-% IV SOLN
500.0000 mg | Freq: Once | INTRAVENOUS | Status: AC
  Administered 2019-09-11: 500 mg via INTRAVENOUS
  Filled 2019-09-11: qty 100

## 2019-09-11 MED ORDER — SODIUM CHLORIDE 0.9 % IV SOLN
2.0000 g | Freq: Once | INTRAVENOUS | Status: AC
  Administered 2019-09-11: 2 g via INTRAVENOUS
  Filled 2019-09-11: qty 2

## 2019-09-11 MED ORDER — VANCOMYCIN HCL 1250 MG/250ML IV SOLN
1250.0000 mg | INTRAVENOUS | Status: DC
  Administered 2019-09-11 – 2019-09-13 (×2): 1250 mg via INTRAVENOUS
  Filled 2019-09-11 (×3): qty 250

## 2019-09-11 MED ORDER — PHENYLEPHRINE HCL-NACL 10-0.9 MG/250ML-% IV SOLN
25.0000 ug/min | INTRAVENOUS | Status: DC
  Administered 2019-09-11: 50 ug/min via INTRAVENOUS
  Filled 2019-09-11 (×6): qty 250

## 2019-09-11 MED ORDER — SODIUM CHLORIDE 0.9 % IV SOLN
1.0000 g | INTRAVENOUS | Status: DC
  Filled 2019-09-11: qty 1

## 2019-09-11 MED ORDER — SODIUM CHLORIDE 0.9 % IV SOLN: 250.0000 mL | INTRAVENOUS | Status: DC

## 2019-09-11 MED ORDER — SODIUM CHLORIDE 0.9 % IV SOLN: 10.0000 mL/h | Freq: Once | INTRAVENOUS | Status: DC

## 2019-09-11 MED ORDER — VANCOMYCIN HCL IN DEXTROSE 1-5 GM/200ML-% IV SOLN: 1000.0000 mg | Freq: Once | INTRAVENOUS | Status: DC

## 2019-09-11 MED ORDER — HYDROCORTISONE NA SUCCINATE PF 100 MG IJ SOLR
100.0000 mg | Freq: Once | INTRAMUSCULAR | Status: AC
  Administered 2019-09-11: 100 mg via INTRAVENOUS
  Filled 2019-09-11: qty 2

## 2019-09-11 MED ORDER — NOREPINEPHRINE 4 MG/250ML-% IV SOLN
INTRAVENOUS | Status: AC
  Administered 2019-09-12: 11:00:00 10 ug/min via INTRAVENOUS
  Filled 2019-09-11: qty 250

## 2019-09-11 MED ORDER — SODIUM CHLORIDE 0.9 % IV BOLUS
250.0000 mL | Freq: Once | INTRAVENOUS | Status: AC
  Administered 2019-09-11: 250 mL via INTRAVENOUS

## 2019-09-11 NOTE — ED Notes (Addendum)
Monitor showing 40/28 mmHg. Manual taken. 58/24 mmHg. MD Rancour notified and RN Tiffany notified.

## 2019-09-11 NOTE — ED Notes (Signed)
Phenylephrine stopped per [place] PA. Start Levophed

## 2019-09-11 NOTE — ED Triage Notes (Signed)
Pt BIB [facility] from her doctor's office. Pt's entresto dose was increased x 1 weeks ago, since pt has had generalized weakness, diarrhea, shortness of breath on exertion and hypotension. Denies pain. Given 200ccNS pta. EMS VS: BP 74/46, HR 70, RR 14, SpO2 96% room air.

## 2019-09-11 NOTE — ED Provider Notes (Signed)
[facility] EMERGENCY DEPARTMENT Provider Note   CSN: [id number] Arrival date & time: 08/14/2019  1639     History Chief Complaint  Patient presents with  . Hypotension    [name] is a 73 y.o. female.  Patient sent from cardiology office with generalized weakness, diarrhea, shortness of breath and low blood pressure.  She currently has been having titration of her medication including Entresto over the past several days.  Her last Entresto dose was 2 days ago.  This was stopped due to her having generalized weakness and diarrhea with shortness of breath.  She was hypotensive in the cardiology office and thought that she was dehydrated.  She states she feels generally weak all over and lightheaded but not any room spinning dizziness.  Denies any chest pain or shortness of breath.  Has been having frequent diarrhea for the past week has been dark.  She cannot quantify how many stools it has been.  No fever.  She does not take any blood thinners.  No chest pain.  No vomiting. She called her cardiology office and had a office appointment today and was then sent to the ED. Per cardiology notes: "It appears that she called the office 12/28 with c/o of persistently low BP with systolics in the [id number] even with the changes made on 12/23 (reduce carvedilol to 12.5 mg twice daily in order to continue Entresto 49-51 mg twice daily). On telephone call, she reported BPs at 86/44 and HR 70. Her weight was noted to have been up 8 lbs, but the last 2 days her weight has been consistent with the following changes she made to her medicines yesterday which have included decreasing Entresto to 24-26 mg BID, increasing carvedilol to 25 mg BID, adding lasix 40 mg QD and continuing spironolactone 12.5 mg QD."   The history is provided by the patient.       Past Medical History:  Diagnosis Date  . Automatic implantable cardioverter-defibrillator in situ    Dr. Beckie Salts follows-next  visit- [phone]  . Cataract   . Diverticulosis   . Dyslipidemia   . Ejection fraction < 50%    EF 25-30%, September, 2010  . GERD (gastroesophageal reflux disease)   . Hypothyroidism   . ICD (implantable cardiac defibrillator) in place    10/2009; Dr. Lovena Le  . Iron deficiency 09/21/2018  . LBBB (left bundle branch block)    old  . Macrocytic anemia   . MDS (myelodysplastic syndrome) ([place]) 12/27/2018  . Mitral regurgitation    Mild, echo, September, 2010  . Nonischemic cardiomyopathy ([place])    Normal coronary arteries, catheterization, September, 2009  . Overweight(278.02)   . Serrated polyp of colon   . Systolic CHF, chronic (HCC)    no recent problems    Patient Active Problem List   Diagnosis Date Noted  . Diabetes mellitus type 2 in obese ([place]) 07/06/2019  . Dyspnea   . Congestive dilated cardiomyopathy ([place])   . MDS (myelodysplastic syndrome) ([place]) 12/27/2018  . Iron deficiency 09/21/2018  . Symptomatic anemia 08/03/2018  . Impaired glucose tolerance 06/21/2017  . Cough variant asthma  vs UACS from ACEi  12/31/2016  . Essential hypertension 12/31/2016  . Morbid obesity due to excess calories ([place]) 12/31/2016  . History of colonic polyps   . Benign neoplasm of descending colon   . Diverticulosis of colon without hemorrhage 01/28/2014  . Serrated adenoma of colon 01/28/2014  . Nonspecific abnormal finding in stool contents 11/29/2013  .  Colon cancer screening 11/29/2013  . History of depression 08/11/2012  . Chronic systolic CHF (congestive heart failure) ([place])   . LBBB (left bundle branch block)   . Automatic implantable cardioverter-defibrillator in situ   . Hypothyroidism   . GERD (gastroesophageal reflux disease)   . Dyslipidemia   . Mitral regurgitation   . Overweight   . Ejection fraction < 50%     Past Surgical History:  Procedure Laterality Date  . BIOPSY  08/07/2018   Procedure: BIOPSY;  Surgeon: Jerene Bears, MD;  Location: [facility];  Service:  Gastroenterology;;  . CARDIAC CATHETERIZATION     '09 last  . [place]    . COLONOSCOPY N/A 11/29/2013   Procedure: COLONOSCOPY;  Surgeon: Jerene Bears, MD;  Location: [facility];  Service: Gastroenterology;  Laterality: N/A;  . COLONOSCOPY N/A 02/25/2015   Procedure: COLONOSCOPY;  Surgeon: Jerene Bears, MD;  Location: [facility];  Service: Gastroenterology;  Laterality: N/A;  . ESOPHAGOGASTRODUODENOSCOPY (EGD) WITH PROPOFOL N/A 08/07/2018   Procedure: ESOPHAGOGASTRODUODENOSCOPY (EGD) WITH PROPOFOL;  Surgeon: Jerene Bears, MD;  Location: [facility];  Service: Gastroenterology;  Laterality: N/A;  . EYE SURGERY Left    x 3-"droopy eyelid"  . heart catherization    . TONSILLECTOMY  1952     OB History   No obstetric history on file.     Family History  Problem Relation Age of Onset  . Heart attack Mother   . Heart disease Mother   . Hypertension Mother   . Lung cancer Father   . Pancreatic cancer Father   . Cancer Father   . Stroke Paternal Grandfather   . Colon cancer Neg Hx   . Diabetes Neg Hx   . Kidney disease Neg Hx   . Liver disease Neg Hx     Social History   Tobacco Use  . Smoking status: Never Smoker  . Smokeless tobacco: Never Used  Substance Use Topics  . Alcohol use: Yes    Comment: 2-4 beers per week  . Drug use: No    Home Medications Prior to Admission medications   Medication Sig Start Date End Date Taking? Authorizing Provider  albuterol (PROVENTIL HFA;VENTOLIN HFA) 108 (90 Base) MCG/ACT inhaler Inhale 2 puffs into the lungs every 6 (six) hours as needed for wheezing or shortness of breath. 11/23/16   Elby Showers, MD  carvedilol (COREG) 12.5 MG tablet Take 1 tablet (12.5 mg total) by mouth 2 (two) times daily. 09/05/19 12/04/19  Nahser, Wonda Cheng, MD  Cyanocobalamin (VITAMIN B-12 PO) Take 1 tablet by mouth daily. gummies    [provider]  folic acid (FOLVITE) 1 MG tablet TAKE 1 TABLET BY MOUTH EVERY DAY 08/27/19    Elby Showers, MD  furosemide (LASIX) 40 MG tablet Take 1/2 to 1 tablet daily as needed for swelling and shortness of breath 09/03/19   Nahser, Wonda Cheng, MD  glucose blood (ACCU-CHEK AVIVA) test strip Check daily.  For Diabetes 250.00 05/29/14   Elby Showers, MD  ipratropium-albuterol (DUONEB) 0.5-2.5 (3) MG/3ML SOLN Take 3 mLs by nebulization every 4 (four) hours as needed (shortness of breath or wheezing). 05/22/19   Elby Showers, MD  Lancets MISC Check glucose daily.  Diabetes 250.00 05/29/14   Elby Showers, MD  levothyroxine (SYNTHROID) 150 MCG tablet TAKE 1 TABLET (150 MCG TOTAL) BY MOUTH DAILY BEFORE BREAKFAST. 08/27/19   Elby Showers, MD  pantoprazole (PROTONIX) 40 MG tablet Take 40  mg by mouth daily.    [provider]  Propylene Glycol (SYSTANE BALANCE) 0.6 % SOLN Place 1 drop into both eyes daily as needed (for dry eyes).    [provider]  sacubitril-valsartan (ENTRESTO) 49-51 MG Take 1 tablet by mouth 2 (two) times daily. 08/29/19   Nahser, Wonda Cheng, MD  spironolactone (ALDACTONE) 25 MG tablet Take 0.5 tablets (12.5 mg total) by mouth daily. 06/26/19   Nahser, Wonda Cheng, MD    Allergies    Patient has no known allergies.  Review of Systems   Review of Systems  Constitutional: Positive for activity change, appetite change and fatigue. Negative for fever.  HENT: Negative for congestion and rhinorrhea.   Respiratory: Negative for cough, chest tightness and shortness of breath.   Cardiovascular: Negative for chest pain.  Gastrointestinal: Positive for diarrhea. Negative for vomiting.  Genitourinary: Negative for dysuria and hematuria.  Skin: Negative for wound.  Neurological: Positive for dizziness, weakness and light-headedness.   all other systems are negative except as noted in the HPI and PMH.    Physical Exam Updated Vital Signs BP (!) 89/53   Pulse 71   Temp 97.7 F (36.5 C) (Oral)   Resp 12   SpO2 94%   Physical Exam Vitals and nursing  note reviewed.  Constitutional:      General: She is not in acute distress.    Appearance: She is well-developed. She is obese.  HENT:     Head: Normocephalic and atraumatic.     Mouth/Throat:     Pharynx: No oropharyngeal exudate.  Eyes:     Conjunctiva/sclera: Conjunctivae normal.     Pupils: Pupils are equal, round, and reactive to light.  Neck:     Comments: No meningismus. Cardiovascular:     Rate and Rhythm: Normal rate and regular rhythm.     Heart sounds: Normal heart sounds. No murmur.  Pulmonary:     Effort: Pulmonary effort is normal. No respiratory distress.     Breath sounds: Normal breath sounds.  Chest:     Chest wall: No tenderness.  Abdominal:     Palpations: Abdomen is soft.     Tenderness: There is abdominal tenderness. There is no guarding or rebound.     Comments: Mild right upper and lower quadrant tenderness, no guarding or rebound  Genitourinary:    Comments: Brown stool, no gross blood brown stool, no gross blood Musculoskeletal:        General: No tenderness. Normal range of motion.     Cervical back: Normal range of motion and neck supple.  Skin:    General: Skin is warm.     Capillary Refill: Capillary refill takes less than 2 seconds.  Neurological:     General: No focal deficit present.     Mental Status: She is alert and oriented to person, place, and time. Mental status is at baseline.     Cranial Nerves: No cranial nerve deficit.     Motor: No abnormal muscle tone.     Coordination: Coordination normal.     Comments:  5/5 strength throughout. CN 2-12 intact.Equal grip strength.   Psychiatric:        Behavior: Behavior normal.     ED Results / Procedures / Treatments   Labs (all labs ordered are listed, but only abnormal results are displayed) Labs Reviewed  CBC WITH DIFFERENTIAL/PLATELET - Abnormal; Notable for the following components:      Result Value   WBC 62.8 (*)  RBC 2.80 (*)    Hemoglobin 8.4 (*)    HCT 25.9 (*)    RDW  25.8 (*)    Platelets 10 (*)    nRBC 20.2 (*)    Lymphs Abs 21.4 (*)    Monocytes Absolute 17.0 (*)    Neutro Abs 24.4 (*)    All other components within normal limits  COMPREHENSIVE METABOLIC PANEL - Abnormal; Notable for the following components:   Sodium 134 (*)    Potassium 6.8 (*)    CO2 16 (*)    Glucose, Bld 100 (*)    BUN 71 (*)    Creatinine, Ser 1.93 (*)    Calcium 7.0 (*)    Total Protein 4.2 (*)    Albumin 2.5 (*)    AST 50 (*)    Total Bilirubin 1.8 (*)    GFR calc non Af Amer 25 (*)    GFR calc Af Amer 29 (*)    All other components within normal limits  BRAIN NATRIURETIC PEPTIDE - Abnormal; Notable for the following components:   B Natriuretic Peptide 307.2 (*)    All other components within normal limits  APTT - Abnormal; Notable for the following components:   aPTT 42 (*)    All other components within normal limits  PROTIME-INR - Abnormal; Notable for the following components:   Prothrombin Time 21.8 (*)    INR 1.9 (*)    All other components within normal limits  POC OCCULT BLOOD, ED - Abnormal; Notable for the following components:   Fecal Occult Bld POSITIVE (*)    All other components within normal limits  CULTURE, BLOOD (ROUTINE X 2)  CULTURE, BLOOD (ROUTINE X 2)  SARS CORONAVIRUS 2 (TAT 6-24 HRS)  URINE CULTURE  RESPIRATORY PANEL BY RT PCR (FLU A&B, COVID)  LACTIC ACID, PLASMA  LACTIC ACID, PLASMA  LIPASE, BLOOD  URINALYSIS, ROUTINE W REFLEX MICROSCOPIC  POTASSIUM  PATHOLOGIST SMEAR REVIEW  POC OCCULT BLOOD, ED  TYPE AND SCREEN  ABO/RH  PREPARE RBC (CROSSMATCH)  PREPARE PLATELET PHERESIS  TROPONIN I (HIGH SENSITIVITY)  TROPONIN I (HIGH SENSITIVITY)    EKG EKG Interpretation  Date/Time:  Tuesday September 11 2019 18:00:24 EST Ventricular Rate:  106 PR Interval:    QRS Duration: 156 QT Interval:  369 QTC Calculation: 490 R Axis:   3 Text Interpretation: Sinus tachycardia Left bundle branch block Rate faster Confirmed by Ezequiel Essex [phone]) on 08/14/2019 6:16:22 PM   Radiology CT ABDOMEN PELVIS WO CONTRAST  Result Date: 08/26/2019 CLINICAL DATA:  Hypotension, shock and diarrhea EXAM: CT ABDOMEN AND PELVIS WITHOUT CONTRAST TECHNIQUE: Multidetector CT imaging of the abdomen and pelvis was performed following the standard protocol without IV contrast. COMPARISON:  CT 04/04/2019, chest x-ray 09/07/2019 FINDINGS: Lower chest: Lung bases demonstrate no acute consolidation or pleural effusion. Mild cardiomegaly. Incompletely visualized intracardiac pacing leads. Hepatobiliary: Calcified gallstones. No focal hepatic abnormality or biliary dilatation. Pancreas: Unremarkable. No pancreatic ductal dilatation or surrounding inflammatory changes. Spleen: Borderline enlarged Adrenals/Urinary Tract: Adrenal glands are within normal limits. Kidneys show no hydronephrosis. The bladder is normal Stomach/Bowel: The stomach is nonenlarged. No dilated small bowel. Negative appendix. Diffuse colon diverticula. Focal wall thickening with inflammatory change at the splenic flexure and proximal descending colon. No definite extraluminal gas. More heavily concentrated diverticula at the sigmoid colon. Vascular/Lymphatic: Nonaneurysmal aorta. Mild aortic atherosclerosis. Multiple enlarged mesenteric root and right lower quadrant lymph nodes measuring up to 18 mm. Reproductive: Uterus and bilateral adnexa are unremarkable. Other:  No free air. Small amount of free fluid in the abdomen and pelvis. Generalized subcutaneous edema consistent with anasarca. Musculoskeletal: No acute or suspicious osseous abnormality. IMPRESSION: 1. Focal wall thickening with surrounding inflammatory change at the splenic flexure and proximal descending colon with diverticula present, findings favored to represent acute diverticulitis over focal colitis. No evidence for perforation. 2. Small amount of abdominal and pelvic ascites. Generalized subcutaneous edema consistent with  anasarca 3. Gallstones 4. Slightly enlarged mesenteric root and right lower quadrant lymph nodes which could be reactive or secondary to infection, inflammation, or neoplastic etiology. Electronically Signed   By: Donavan Foil M.D.   On: 08/24/2019 22:08   DG Chest Portable 1 View  Result Date: 09/08/2019 CLINICAL DATA:  Increased medication dose with increasing weakness. EXAM: PORTABLE CHEST 1 VIEW COMPARISON:  07/17/2019 FINDINGS: Left-sided dual lead pacer defibrillator, power pack over left chest leads entering via subclavian approach projecting over cardiac silhouette as before. Cardiomediastinal contours are stable. Lungs are clear though partially obscured on the left due to power pack projecting over the left chest. No signs of pleural effusion. Visualized skeletal structures are unremarkable aside from spinal degenerative changes. IMPRESSION: No acute cardiopulmonary disease. Stable appearance of pacer defibrillator. Electronically Signed   By: Zetta Bills M.D.   On: 08/14/2019 18:18    Procedures .Critical Care Performed by: Ezequiel Essex, MD Authorized by: Ezequiel Essex, MD   Critical care provider statement:    Critical care time (minutes):  60   Critical care was necessary to treat or prevent imminent or life-threatening deterioration of the following conditions:  Circulatory failure and shock   Critical care was time spent personally by me on the following activities:  Discussions with consultants, evaluation of patient's response to treatment, examination of patient, ordering and performing treatments and interventions, ordering and review of laboratory studies, ordering and review of radiographic studies, pulse oximetry, re-evaluation of patient's condition, obtaining history from patient or surrogate and review of old charts Ultrasound ED Echo  Date/Time: 08/16/2019 6:57 PM Performed by: Ezequiel Essex, MD Authorized by: Ezequiel Essex, MD   Procedure details:     Indications: hypotension and dyspnea     Views: subxiphoid     Images: archived   Findings:    Pericardium: small pericardial effusion     LV Function: severly depressed (<30%)     RV Diameter: normal   Impression:    Impression: probable low CVP and decreased contractility     (including critical care time)  Medications Ordered in ED Medications  sodium chloride 0.9 % bolus 250 mL (has no administration in time range)    ED Course  I have reviewed the triage vital signs and the nursing notes.  Pertinent labs & imaging results that were available during my care of the patient were reviewed by me and considered in my medical decision making (see chart for details).    MDM Rules/Calculators/A&P                      Patient from cardiology office with hypotension, generalized weakness, shortness of breath.  Blood pressures in the 70s on arrival.  She was given some saline by EMS.  Her EKG is sinus tachycardia with unchanged left bundle branch block.  She does have a history of MDS.  Her Hemoccult is positive but hemoglobin at 8.4.  Platelets are 10 and white blood cell count of 62.  Blood pressure trended down again to the 0000000 systolic.  She is given additional IV fluid.  She is only received 500 so far.  Her lactate is normal.  She does have a leukocytosis but no fever.  Broad-spectrum antibiotics are started for possibility of occult sepsis.  She also be given RBCs and platelet transfusion.  Bedside ultrasound does not show any right heart strain or significant pericardial effusion. No obvious AAA.   Repeat potassium is normal on recheck.  Blood pressure improving to 90s after 2 L of fluid and phenylephrine.  Lactate remains normal.  Tachycardia improving.  Afebrile.  CT scan shows possible mild diverticulitis. Gallstones and ascites present.  No evidence of perforation or abscess.  Patient mentating normally despite her hypotension and denies pain.  Admission discussed  with critical care team. COVID PCR pending.   [name] was evaluated in Emergency Department on 08/14/2019 for the symptoms described in the history of present illness. She was evaluated in the context of the global COVID-19 pandemic, which necessitated consideration that the patient might be at risk for infection with the SARS-CoV-2 virus that causes COVID-19. Institutional protocols and algorithms that pertain to the evaluation of patients at risk for COVID-19 are in a state of rapid change based on information released by regulatory bodies including the CDC and federal and state organizations. These policies and algorithms were followed during the patient's care in the ED.  Final Clinical Impression(s) / ED Diagnoses Final diagnoses:  Hypotension, unspecified hypotension type    Rx / DC Orders ED Discharge Orders    None       Erline Siddoway, Annie Main, MD 08/19/2019 2336

## 2019-09-11 NOTE — Progress Notes (Signed)
Pharmacy Antibiotic Note  [name] is a 73 y.o. female admitted on 08/20/2019 with sepsis.  Pharmacy has been consulted for Vancomycin and cefepime dosing.  Vancomycin 1250 mg IV Q 36 hrs. Goal AUC 400-550. Expected AUC: 485 SCr used: 1.93  Plan: Vancomycin 1250 mg IV q36hr Cefepime 1 gm IV q24hr Monitor renal function, C&S, clinical status and vancomycin levels as needed   Temp (24hrs), Avg:97.7 F (36.5 C), Min:97.7 F (36.5 C), Max:97.7 F (36.5 C)  Recent Labs  Lab 09/13/2019 1702 08/27/2019 1714  WBC 62.8*  --   CREATININE 1.93*  --   LATICACIDVEN  --  1.1    Estimated Creatinine Clearance: 23.6 mL/min (A) (by C-G formula based on SCr of 1.93 mg/dL (H)).    No Known Allergies  Antimicrobials this admission: Vanc 12/29 >>  Cefepime 12/29 >>  Flagyl 12/29 >>  Thank you for allowing pharmacy to be a part of this patient's care.  Alanda Slim, PharmD, [facility] Clinical Pharmacist Please see AMION for all Pharmacists' Contact Phone Numbers 08/28/2019, 7:26 PM

## 2019-09-11 NOTE — H&P (Addendum)
NAME:  [name], MRN:  [id number], DOB:  [birth year], LOS: 0 ADMISSION DATE:  09/04/2019, CONSULTATION DATE:  08/27/2019 REFERRING MD: Raelyn Number, CHIEF COMPLAINT:  Hypotension   Brief History   Patient is a 73 yo female sent to ER from Cardiology clinic with hypotension felt secondary to meds including Entresto.    History of present illness   Patient is a 73 yo female sent to ER from Cardiology clinic with hypotension felt secondary to meds including caredilol,  Entresto , lasix and spironalactone.  She also has had some diarrhea.  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 IN the emergency room her systolic is 70 and she is started on levophed.  Most recent K is 5.1, 6.8 at presentation.  Cr 1.93, baseline < 1.  AG is 11, BNP is 307, troponin 5.  She also has myelodysplastic syndrome and now hs a wbc  CT 64 with neutrophil predominance hgb of 8.5.  On evaluation she really has no complaints.    Patient's COVID test is positive, will need to go to 3 M.    Past Medical History    Automatic implantable cardioverter-defibrillator in situ    Dr. Beckie Salts follows-next visit- [phone]  . Cataract   . Diverticulosis   . Dyslipidemia   . Ejection fraction < 50%    EF 25-30%, September, 2010  . GERD (gastroesophageal reflux disease)   . Hypothyroidism   . ICD (implantable cardiac defibrillator) in place    10/2009; Dr. Lovena Le  . Iron deficiency 09/21/2018  . LBBB (left bundle branch block)    old  . Macrocytic anemia   . MDS (myelodysplastic syndrome) ([place]) 12/27/2018  . Mitral regurgitation    Mild, echo, September, 2010  . Nonischemic cardiomyopathy ([place])    Normal coronary arteries, catheterization, September, 2009  . Overweight(278.02)   . Serrated polyp of colon   . Systolic CHF, chronic ([place])    no recent problems     Significant Hospital Events   Admission 08/31/2019 [facility]  Consults:  Cardiology  Procedures:  NA  Significant Diagnostic  Tests:  NA  Micro Data:  NA  Antimicrobials:  NA  Interim history/subjective:  NA  Objective   Blood pressure (!) 95/32, pulse 97, temperature 97.9 F (36.6 C), temperature source Oral, resp. rate (!) 21, SpO2 95 %.        Intake/Output Summary (Last 24 hours) at 09/10/2019 2219 Last data filed at 08/14/2019 2048 Gross per 24 hour  Intake 2539 ml  Output --  Net 2539 ml   There were no vitals filed for this visit.  Examination: General: Female in no distress HENT:WNL Lungs: Clear Cardiovascular:Reg Abdomen: Benign,  Extremities: WNL Neuro: nonfocal GU: NA  Resolved Hospital Problem list   NA  Assessment & Plan:  1.  Hypotension related to diarrhea, recent med changes:  Hold cardiac meds until resolved.  Now on levophed with hydration started.  Blood cultures planted, started on cefepime and Vanco that will be continued as inpatient until cultures returned.  Currently on 20 mcg of levophed, will try to wean further, hopefully will be able to with further wean further after blood transfusion.  PLT count of 10,000, I would like avoid cvl, will place groin line if necessary.  2.  Diarrhea:  Will monitor  3.  Chronic systolic chf ef at 123456:  Monitor closely in icu as meds are restarted.  4.  Anemia and myelodysplastic syndrome with WBC 64,  May need  hematology to take a look prior to DC.  Currently receiving 1 unit prbcs.  5.  Hyperkalemia:  Down to 5.1 from 6.8 with hydration  6.  COVID positive: probably explains the diarrhea, will need 71M bed, will start medrol at 6 mg a day with   Best practice:  Diet: NPO Pain/Anxiety/Delirium protocol (if indicated): NA VAP protocol (if indicated): NA DVT prophylaxis: scd GI prophylaxis: pepcid Glucose control: monitor Mobility: bedrest Code Status: Full Family Communication: discussed with prtiwent Disposition:   Labs   CBC: Recent Labs  Lab 08/28/2019 1702  WBC 62.8*  NEUTROABS 24.4*  HGB 8.4*  HCT 25.9*  MCV  92.5  PLT 10*    Basic Metabolic Panel: Recent Labs  Lab 08/23/2019 1702 09/08/2019 2016  NA 134*  --   K 6.8* 5.1  CL 107  --   CO2 16*  --   GLUCOSE 100*  --   BUN 71*  --   CREATININE 1.93*  --   CALCIUM 7.0*  --    GFR: Estimated Creatinine Clearance: 23.6 mL/min (A) (by C-G formula based on SCr of 1.93 mg/dL (H)). Recent Labs  Lab 09/13/2019 1702 08/16/2019 1714 09/07/2019 2016  WBC 62.8*  --   --   LATICACIDVEN  --  1.1 1.0    Liver Function Tests: Recent Labs  Lab 09/03/2019 1702  AST 50*  ALT 36  ALKPHOS 73  BILITOT 1.8*  PROT 4.2*  ALBUMIN 2.5*   Recent Labs  Lab 08/25/2019 1757  LIPASE 43   No results for input(s): AMMONIA in the last 168 hours.  ABG    Component Value Date/Time   PHART 7.408 02/01/2019 1133   PCO2ART 68.3 (HH) 02/01/2019 1133   PO2ART 62.1 (L) 02/01/2019 1133   HCO3 42.4 (H) 02/01/2019 1133   TCO2 32 05/15/2008 1515   O2SAT 71.5 02/05/2019 0350     Coagulation Profile: Recent Labs  Lab 08/25/2019 2016  INR 1.9*    Cardiac Enzymes: No results for input(s): CKTOTAL, CKMB, CKMBINDEX, TROPONINI in the last 168 hours.  HbA1C: Hgb A1c MFr Bld  Date/Time Value Ref Range Status  05/17/2019 09:07 AM 5.8 (H) <5.7 % of total Hgb Final    Comment:    For someone without known diabetes, a hemoglobin  A1c value between 5.7% and 6.4% is consistent with prediabetes and should be confirmed with a  follow-up test. . For someone with known diabetes, a value <7% indicates that their diabetes is well controlled. A1c targets should be individualized based on duration of diabetes, age, comorbid conditions, and other considerations. . This assay result is consistent with an increased risk of diabetes. . Currently, no consensus exists regarding use of hemoglobin A1c for diagnosis of diabetes for children. Marland Kitchen   05/12/2018 09:13 AM 5.9 (H) <5.7 % of total Hgb Final    Comment:    For someone without known diabetes, a hemoglobin  A1c value  between 5.7% and 6.4% is consistent with prediabetes and should be confirmed with a  follow-up test. . For someone with known diabetes, a value <7% indicates that their diabetes is well controlled. A1c targets should be individualized based on duration of diabetes, age, comorbid conditions, and other considerations. . This assay result is consistent with an increased risk of diabetes. . Currently, no consensus exists regarding use of hemoglobin A1c for diagnosis of diabetes for children. .     CBG: No results for input(s): GLUCAP in the last 168 hours.  Review of  Systems:   Feels weak and recent diarrhea confirmed but otherwise no positives on 14 point review of systems  Past Medical History  She,  has a past medical history of Automatic implantable cardioverter-defibrillator in situ, Cataract, Diverticulosis, Dyslipidemia, Ejection fraction < 50%, GERD (gastroesophageal reflux disease), Hypothyroidism, ICD (implantable cardiac defibrillator) in place, Iron deficiency (09/21/2018), LBBB (left bundle branch block), Macrocytic anemia, MDS (myelodysplastic syndrome) ([place]) (12/27/2018), Mitral regurgitation, Nonischemic cardiomyopathy ([place]), Overweight(278.02), Serrated polyp of colon, and Systolic CHF, chronic ([place]).   Surgical History    Past Surgical History:  Procedure Laterality Date  . BIOPSY  08/07/2018   Procedure: BIOPSY;  Surgeon: Jerene Bears, MD;  Location: [facility];  Service: Gastroenterology;;  . CARDIAC CATHETERIZATION     '09 last  . [place]    . COLONOSCOPY N/A 11/29/2013   Procedure: COLONOSCOPY;  Surgeon: Jerene Bears, MD;  Location: [facility];  Service: Gastroenterology;  Laterality: N/A;  . COLONOSCOPY N/A 02/25/2015   Procedure: COLONOSCOPY;  Surgeon: Jerene Bears, MD;  Location: [facility];  Service: Gastroenterology;  Laterality: N/A;  . ESOPHAGOGASTRODUODENOSCOPY (EGD) WITH PROPOFOL N/A 08/07/2018   Procedure:  ESOPHAGOGASTRODUODENOSCOPY (EGD) WITH PROPOFOL;  Surgeon: Jerene Bears, MD;  Location: [facility];  Service: Gastroenterology;  Laterality: N/A;  . EYE SURGERY Left    x 3-"droopy eyelid"  . heart catherization    . TONSILLECTOMY  1952     Social History   reports that she has never smoked. She has never used smokeless tobacco. She reports current alcohol use. She reports that she does not use drugs.   Family History   Her family history includes Cancer in her father; Heart attack in her mother; Heart disease in her mother; Hypertension in her mother; Lung cancer in her father; Pancreatic cancer in her father; Stroke in her paternal grandfather. There is no history of Colon cancer, Diabetes, Kidney disease, or Liver disease.   Allergies No Known Allergies   Home Medications  Prior to Admission medications   Medication Sig Start Date End Date Taking? Authorizing Provider  albuterol (PROVENTIL HFA;VENTOLIN HFA) 108 (90 Base) MCG/ACT inhaler Inhale 2 puffs into the lungs every 6 (six) hours as needed for wheezing or shortness of breath. 11/23/16   Elby Showers, MD  carvedilol (COREG) 12.5 MG tablet Take 1 tablet (12.5 mg total) by mouth 2 (two) times daily. 09/05/19 12/04/19  Nahser, Wonda Cheng, MD  Cyanocobalamin (VITAMIN B-12 PO) Take 1 tablet by mouth daily. gummies    [provider]  folic acid (FOLVITE) 1 MG tablet TAKE 1 TABLET BY MOUTH EVERY DAY 08/27/19   Elby Showers, MD  furosemide (LASIX) 40 MG tablet Take 1/2 to 1 tablet daily as needed for swelling and shortness of breath 09/03/19   Nahser, Wonda Cheng, MD  glucose blood (ACCU-CHEK AVIVA) test strip Check daily.  For Diabetes 250.00 05/29/14   Elby Showers, MD  ipratropium-albuterol (DUONEB) 0.5-2.5 (3) MG/3ML SOLN Take 3 mLs by nebulization every 4 (four) hours as needed (shortness of breath or wheezing). 05/22/19   Elby Showers, MD  Lancets MISC Check glucose daily.  Diabetes 250.00 05/29/14   Elby Showers, MD   levothyroxine (SYNTHROID) 150 MCG tablet TAKE 1 TABLET (150 MCG TOTAL) BY MOUTH DAILY BEFORE BREAKFAST. 08/27/19   Elby Showers, MD  pantoprazole (PROTONIX) 40 MG tablet Take 40 mg by mouth daily.    [provider]  Propylene Glycol (SYSTANE BALANCE) 0.6 % SOLN  Place 1 drop into both eyes daily as needed (for dry eyes).    [provider]  sacubitril-valsartan (ENTRESTO) 49-51 MG Take 1 tablet by mouth 2 (two) times daily. 08/29/19   Nahser, Wonda Cheng, MD  spironolactone (ALDACTONE) 25 MG tablet Take 0.5 tablets (12.5 mg total) by mouth daily. 06/26/19   Nahser, Wonda Cheng, MD     Critical care time:Over 35 minutes spent in bedside evaluation, chart review and critical care planning

## 2019-09-12 ENCOUNTER — Telehealth: Payer: Self-pay | Admitting: Nurse Practitioner

## 2019-09-12 DIAGNOSIS — K5732 Diverticulitis of large intestine without perforation or abscess without bleeding: Secondary | ICD-10-CM | POA: Diagnosis not present

## 2019-09-12 DIAGNOSIS — E785 Hyperlipidemia, unspecified: Secondary | ICD-10-CM | POA: Diagnosis present

## 2019-09-12 DIAGNOSIS — J9601 Acute respiratory failure with hypoxia: Secondary | ICD-10-CM | POA: Diagnosis not present

## 2019-09-12 DIAGNOSIS — I959 Hypotension, unspecified: Secondary | ICD-10-CM | POA: Diagnosis present

## 2019-09-12 DIAGNOSIS — D469 Myelodysplastic syndrome, unspecified: Secondary | ICD-10-CM | POA: Diagnosis present

## 2019-09-12 DIAGNOSIS — I340 Nonrheumatic mitral (valve) insufficiency: Secondary | ICD-10-CM | POA: Diagnosis present

## 2019-09-12 DIAGNOSIS — U071 COVID-19: Secondary | ICD-10-CM | POA: Diagnosis not present

## 2019-09-12 DIAGNOSIS — E039 Hypothyroidism, unspecified: Secondary | ICD-10-CM | POA: Diagnosis present

## 2019-09-12 DIAGNOSIS — N179 Acute kidney failure, unspecified: Secondary | ICD-10-CM | POA: Diagnosis not present

## 2019-09-12 DIAGNOSIS — I447 Left bundle-branch block, unspecified: Secondary | ICD-10-CM | POA: Diagnosis present

## 2019-09-12 DIAGNOSIS — R6521 Severe sepsis with septic shock: Secondary | ICD-10-CM | POA: Diagnosis not present

## 2019-09-12 DIAGNOSIS — K219 Gastro-esophageal reflux disease without esophagitis: Secondary | ICD-10-CM | POA: Diagnosis not present

## 2019-09-12 DIAGNOSIS — Z66 Do not resuscitate: Secondary | ICD-10-CM | POA: Diagnosis not present

## 2019-09-12 DIAGNOSIS — R579 Shock, unspecified: Secondary | ICD-10-CM | POA: Diagnosis not present

## 2019-09-12 DIAGNOSIS — Z6841 Body Mass Index (BMI) 40.0 and over, adult: Secondary | ICD-10-CM | POA: Diagnosis not present

## 2019-09-12 DIAGNOSIS — I428 Other cardiomyopathies: Secondary | ICD-10-CM | POA: Diagnosis not present

## 2019-09-12 DIAGNOSIS — I313 Pericardial effusion (noninflammatory): Secondary | ICD-10-CM | POA: Diagnosis not present

## 2019-09-12 DIAGNOSIS — Z8249 Family history of ischemic heart disease and other diseases of the circulatory system: Secondary | ICD-10-CM | POA: Diagnosis not present

## 2019-09-12 DIAGNOSIS — R0602 Shortness of breath: Secondary | ICD-10-CM | POA: Diagnosis not present

## 2019-09-12 DIAGNOSIS — Z801 Family history of malignant neoplasm of trachea, bronchus and lung: Secondary | ICD-10-CM | POA: Diagnosis not present

## 2019-09-12 DIAGNOSIS — Z95810 Presence of automatic (implantable) cardiac defibrillator: Secondary | ICD-10-CM | POA: Diagnosis not present

## 2019-09-12 DIAGNOSIS — E669 Obesity, unspecified: Secondary | ICD-10-CM | POA: Diagnosis present

## 2019-09-12 DIAGNOSIS — A4189 Other specified sepsis: Secondary | ICD-10-CM | POA: Diagnosis not present

## 2019-09-12 DIAGNOSIS — I110 Hypertensive heart disease with heart failure: Secondary | ICD-10-CM | POA: Diagnosis present

## 2019-09-12 DIAGNOSIS — E1169 Type 2 diabetes mellitus with other specified complication: Secondary | ICD-10-CM | POA: Diagnosis present

## 2019-09-12 DIAGNOSIS — I5022 Chronic systolic (congestive) heart failure: Secondary | ICD-10-CM | POA: Diagnosis not present

## 2019-09-12 DIAGNOSIS — Z800 Family history of malignant neoplasm of digestive organs: Secondary | ICD-10-CM | POA: Diagnosis not present

## 2019-09-12 LAB — PHOSPHORUS: Phosphorus: 5.7 mg/dL — ABNORMAL HIGH (ref 2.5–4.6)

## 2019-09-12 LAB — BASIC METABOLIC PANEL
Anion gap: 13 (ref 5–15)
Anion gap: 7 (ref 5–15)
BUN: 55 mg/dL — ABNORMAL HIGH (ref 8–23)
BUN: 44 mg/dL — ABNORMAL HIGH (ref 8–23)
CO2: 16 mmol/L — ABNORMAL LOW (ref 22–32)
CO2: 18 mmol/L — ABNORMAL LOW (ref 22–32)
Calcium: 6.6 mg/dL — ABNORMAL LOW (ref 8.9–10.3)
Calcium: 6.5 mg/dL — ABNORMAL LOW (ref 8.9–10.3)
Chloride: 110 mmol/L (ref 98–111)
Chloride: 115 mmol/L — ABNORMAL HIGH (ref 98–111)
Creatinine, Ser: 1.51 mg/dL — ABNORMAL HIGH (ref 0.44–1.00)
Creatinine, Ser: 1.24 mg/dL — ABNORMAL HIGH (ref 0.44–1.00)
GFR calc Af Amer: 39 mL/min — ABNORMAL LOW (ref >60)
GFR calc Af Amer: 50 mL/min — ABNORMAL LOW (ref >60)
GFR calc non Af Amer: 34 mL/min — ABNORMAL LOW (ref >60)
GFR calc non Af Amer: 43 mL/min — ABNORMAL LOW (ref >60)
Glucose, Bld: 161 mg/dL — ABNORMAL HIGH (ref 70–99)
Glucose, Bld: 146 mg/dL — ABNORMAL HIGH (ref 70–99)
Potassium: 5.6 mmol/L — ABNORMAL HIGH (ref 3.5–5.1)
Potassium: 5.2 mmol/L — ABNORMAL HIGH (ref 3.5–5.1)
Sodium: 139 mmol/L (ref 135–145)
Sodium: 140 mmol/L (ref 135–145)

## 2019-09-12 LAB — TYPE AND SCREEN
ABO/RH(D): O POS
Antibody Screen: NEGATIVE
Unit division: 0

## 2019-09-12 LAB — BPAM RBC
Blood Product Expiration Date: 202101242359
ISSUE DATE / TIME: 202012292237
Unit Type and Rh: 5100

## 2019-09-12 LAB — CBC
HCT: 29.2 % — ABNORMAL LOW (ref 36.0–46.0)
Hemoglobin: 9.4 g/dL — ABNORMAL LOW (ref 12.0–15.0)
MCH: 29.5 pg (ref 26.0–34.0)
MCHC: 32.2 g/dL (ref 30.0–36.0)
MCV: 91.5 fL (ref 80.0–100.0)
Platelets: 41 10*3/uL — ABNORMAL LOW (ref 150–400)
RBC: 3.19 MIL/uL — ABNORMAL LOW (ref 3.87–5.11)
RDW: 24.9 % — ABNORMAL HIGH (ref 11.5–15.5)
WBC: 75.9 10*3/uL (ref 4.0–10.5)
nRBC: 21.7 % — ABNORMAL HIGH (ref 0.0–0.2)

## 2019-09-12 LAB — BPAM PLATELET PHERESIS
Blood Product Expiration Date: 202012312359
ISSUE DATE / TIME: 202012292037
Unit Type and Rh: 5100

## 2019-09-12 LAB — URINE CULTURE: Culture: NO GROWTH

## 2019-09-12 LAB — TROPONIN I (HIGH SENSITIVITY): Troponin I (High Sensitivity): 10 ng/L (ref <18)

## 2019-09-12 LAB — PREPARE PLATELET PHERESIS: Unit division: 0

## 2019-09-12 LAB — GLUCOSE, CAPILLARY: Glucose-Capillary: 134 mg/dL — ABNORMAL HIGH (ref 70–99)

## 2019-09-12 LAB — MAGNESIUM
Magnesium: 1.6 mg/dL — ABNORMAL LOW (ref 1.7–2.4)
Magnesium: 2.2 mg/dL (ref 1.7–2.4)

## 2019-09-12 LAB — SARS CORONAVIRUS 2 (TAT 6-24 HRS): SARS Coronavirus 2: POSITIVE — AB

## 2019-09-12 LAB — MRSA PCR SCREENING: MRSA by PCR: POSITIVE — AB

## 2019-09-12 MED ORDER — ACETAMINOPHEN 325 MG PO TABS: 650.0000 mg | ORAL_TABLET | ORAL | Status: DC | PRN

## 2019-09-12 MED ORDER — FAMOTIDINE IN NACL 20-0.9 MG/50ML-% IV SOLN
20.0000 mg | Freq: Two times a day (BID) | INTRAVENOUS | Status: DC
  Administered 2019-09-12 (×2): 20 mg via INTRAVENOUS
  Filled 2019-09-12 (×2): qty 50

## 2019-09-12 MED ORDER — CHLORHEXIDINE GLUCONATE CLOTH 2 % EX PADS
6.0000 | MEDICATED_PAD | Freq: Every day | CUTANEOUS | Status: DC
  Administered 2019-09-12 – 2019-09-16 (×5): 6 via TOPICAL

## 2019-09-12 MED ORDER — FAMOTIDINE IN NACL 20-0.9 MG/50ML-% IV SOLN
20.0000 mg | INTRAVENOUS | Status: DC
  Administered 2019-09-13 – 2019-09-15 (×3): 20 mg via INTRAVENOUS
  Filled 2019-09-12 (×3): qty 50

## 2019-09-12 MED ORDER — ONDANSETRON HCL 4 MG/2ML IJ SOLN: 4.0000 mg | Freq: Four times a day (QID) | INTRAMUSCULAR | Status: DC | PRN

## 2019-09-12 MED ORDER — SODIUM CHLORIDE 0.9 % IV SOLN
3.0000 g | Freq: Two times a day (BID) | INTRAVENOUS | Status: DC
  Administered 2019-09-12: 3 g via INTRAVENOUS
  Filled 2019-09-12 (×3): qty 8

## 2019-09-12 MED ORDER — SODIUM ZIRCONIUM CYCLOSILICATE 10 G PO PACK
10.0000 g | PACK | Freq: Two times a day (BID) | ORAL | Status: AC
  Administered 2019-09-12 (×2): 10 g via ORAL
  Filled 2019-09-12 (×2): qty 1

## 2019-09-12 MED ORDER — NOREPINEPHRINE 4 MG/250ML-% IV SOLN
0.0000 ug/min | INTRAVENOUS | Status: DC
  Administered 2019-09-12: 01:00:00 28 ug/min via INTRAVENOUS
  Administered 2019-09-12 (×2): 10 ug/min via INTRAVENOUS
  Administered 2019-09-13: 8 ug/min via INTRAVENOUS
  Administered 2019-09-13: 10 ug/min via INTRAVENOUS
  Administered 2019-09-14: 4 ug/min via INTRAVENOUS
  Administered 2019-09-15 – 2019-09-16 (×2): 5 ug/min via INTRAVENOUS
  Administered 2019-09-16: 2 ug/min via INTRAVENOUS
  Filled 2019-09-12 (×10): qty 250

## 2019-09-12 MED ORDER — LACTATED RINGERS IV BOLUS
500.0000 mL | Freq: Once | INTRAVENOUS | Status: AC
  Administered 2019-09-12: 11:00:00 500 mL via INTRAVENOUS

## 2019-09-12 MED ORDER — MAGNESIUM SULFATE 2 GM/50ML IV SOLN
2.0000 g | Freq: Once | INTRAVENOUS | Status: AC
  Administered 2019-09-12: 13:00:00 2 g via INTRAVENOUS
  Filled 2019-09-12: qty 50

## 2019-09-12 MED ORDER — SODIUM CHLORIDE 0.9 % IV SOLN
3.0000 g | Freq: Three times a day (TID) | INTRAVENOUS | Status: DC
  Administered 2019-09-12 – 2019-09-16 (×13): 3 g via INTRAVENOUS
  Filled 2019-09-12 (×13): qty 8

## 2019-09-12 MED ORDER — LACTATED RINGERS IV SOLN: INTRAVENOUS | Status: DC

## 2019-09-12 MED ORDER — ORAL CARE MOUTH RINSE
15.0000 mL | Freq: Two times a day (BID) | OROMUCOSAL | Status: DC
  Administered 2019-09-12 – 2019-09-17 (×8): 15 mL via OROMUCOSAL

## 2019-09-12 NOTE — Telephone Encounter (Signed)
Called to Discuss with patient about Covid symptoms and the use of bamlanivimab, a monoclonal antibody infusion for those with mild to moderate Covid symptoms and at a high risk of hospitalization.     Pt is qualified for this infusion at the [facility] center due to co-morbid conditions and/or a member of an at-risk group.     Unable to reach pt  

## 2019-09-12 NOTE — Progress Notes (Signed)
PHARMACY NOTE:  ANTIMICROBIAL RENAL DOSAGE ADJUSTMENT  Current antimicrobial regimen includes a mismatch between antimicrobial dosage and estimated renal function.  As per policy approved by the Pharmacy & Therapeutics and Medical Executive Committees, the antimicrobial dosage will be adjusted accordingly.  Current antimicrobial dosage:  Unasyn 3 gm IV Q 12 hrs  Indication: Intra-Abdominal Infection  Renal Function:  Estimated Creatinine Clearance: 37.4 mL/min (A) (by C-G formula based on SCr of 1.24 mg/dL (H)). []      On intermittent HD, scheduled: []      On CRRT    Antimicrobial dosage has been changed to:  Unasyn 3 gm IV Q 8 hrs  Additional comments: Renal function improving; Pharmacy will continue to monitor and adjust dosing regimen as needed  Thank you for allowing pharmacy to be a part of this patient's care.  Gillermina Hu, PharmD, BCPS, [facility] Clinical Pharmacist 09/12/2019 9:58 PM

## 2019-09-12 NOTE — Progress Notes (Signed)
eLink Physician-Brief Progress Note Patient Name: [name] DOB: 1946-03-03 MRN: [id number]   Date of Service  09/12/2019  HPI/Events of Note  Pt with Heart failure with reduced ejection fraction being managed by cardiology, who was sent to [facility] for evaluation of hypotension, diarrhea and difficulty breathing, COVID 19 testing was positive.  eICU Interventions  New patient evaluation completed.        Kerry Kass Ogan 09/12/2019, 5:43 AM

## 2019-09-12 NOTE — Progress Notes (Signed)
NAME:  [name], MRN:  [id number], DOB:  12-24-1945, LOS: 0 ADMISSION DATE:  08/15/2019, CONSULTATION DATE:  09/13/2019 REFERRING MD: Raelyn Number, CHIEF COMPLAINT:  Hypotension   Brief History    Patient is a 73 yo female sent to ER from Cardiology clinic with hypotension felt secondary to meds including caredilol,  Entresto , lasix and spironalactone.  She also has had some diarrhea.  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 IN the emergency room her systolic is 70 and she is started on levophed.  Most recent K is 5.1, 6.8 at presentation.  Cr 1.93, baseline < 1.  AG is 11, BNP is 307, troponin 5. She also has myelodysplastic syndrome and now hs a wbc  CT 64 with neutrophil predominance hgb of 8.5.  On evaluation she really has no complaints.   Patient's COVID test is positive, will need to go to 3 M.    Past Medical History    has a past medical history of Automatic implantable cardioverter-defibrillator in situ, Cataract, Diverticulosis, Dyslipidemia, Ejection fraction < 50%, GERD (gastroesophageal reflux disease), Hypothyroidism, ICD (implantable cardiac defibrillator) in place, Iron deficiency (09/21/2018), LBBB (left bundle branch block), Macrocytic anemia, MDS (myelodysplastic syndrome) ([place]) (12/27/2018), Mitral regurgitation, Nonischemic cardiomyopathy ([place]), Overweight(278.02), Serrated polyp of colon, and Systolic CHF, chronic ([place]).   has a past surgical history that includes Tonsillectomy (1952); Cardiac defibrillator placement; heart catherization; Cardiac catheterization; Eye surgery (Left); Colonoscopy (N/A, 11/29/2013); Colonoscopy (N/A, 02/25/2015); Esophagogastroduodenoscopy (egd) with propofol (N/A, 08/07/2018); and biopsy (08/07/2018).   Significant Hospital Events   Admission 08/31/2019 [facility]  Consults:  Cardiology  Procedures:  NA  Significant Diagnostic Tests:  NA  Micro Data:  NA  Antimicrobials:   Anti-infectives (From admission, onward)   Start      Dose/Rate Route Frequency Ordered Stop   09/12/19 1900  ceFEPIme (MAXIPIME) 1 g in sodium chloride 0.9 % 100 mL IVPB     1 g 200 mL/hr over 30 Minutes Intravenous Every 24 hours 08/29/2019 1928     09/07/2019 2000  vancomycin (VANCOREADY) IVPB 1250 mg/250 mL     1,250 mg 166.7 mL/hr over 90 Minutes Intravenous Every 36 hours 08/17/2019 1928     09/13/2019 1900  ceFEPIme (MAXIPIME) 2 g in sodium chloride 0.9 % 100 mL IVPB     2 g 200 mL/hr over 30 Minutes Intravenous  Once 08/15/2019 1845 09/01/2019 2052   09/10/2019 1900  metroNIDAZOLE (FLAGYL) IVPB 500 mg     500 mg 100 mL/hr over 60 Minutes Intravenous  Once 09/07/2019 1845 09/07/2019 2052   08/21/2019 1900  vancomycin (VANCOCIN) IVPB 1000 mg/200 mL premix  Status:  Discontinued     1,000 mg 200 mL/hr over 60 Minutes Intravenous  Once 09/12/2019 1845 09/12/2019 1928       Interim history/subjective:  09/12/2019 - on some levophed. Reprots diarrhea x 2 weeks. No clustering risk for covid. Was covid + 07/06/2019 and positive again now. Only new interim hx is diarrhea. D/w  Dr Acie Fredrickson - patient has NICM. Clinical story sounds like dehdyration. Patient says normal sbp > 105. Curerntly feels well. No acute covi symptoms other than diarrha. CT abd suggestive of splenic flexure area diverituculits  AKi better K better but still high Mag low  Objective   Blood pressure (!) 87/48, pulse 79, temperature 98.1 F (36.7 C), temperature source Oral, resp. rate 15, height 4\' 11"  (1.499 m), weight 82 kg, SpO2 100 %.        Intake/Output  Summary (Last 24 hours) at 09/12/2019 1016 Last data filed at 09/12/2019 0800 Gross per 24 hour  Intake 4585.51 ml  Output --  Net 4585.51 ml   Filed Weights   09/12/19 0459  Weight: 82 kg    General Appearance:  Looks well. Siiting in bed. Texting Head:  Normocephalic, without obvious abnormality, atraumatic Eyes:  PERRL - yes, conjunctiva/corneas - muddy     Ears:  Normal external ear canals, both ears Nose:  G tube -  no Throat:  ETT TUBE - no , OG tube - no Neck:  Supple,  No enlargement/tenderness/nodules Lungs: Clear to auscultation bilaterally, Heart:  S1 and S2 normal, no murmur, CVP - x.  Pressors - levophed + Abdomen:  Soft, no masses, no organomegaly Genitalia / Rectal:  Not done Extremities:  Extremities- intact with some eema Skin:  ntact in exposed areas . Sacral area - not examined Neurologic:  Sedation - none -> RASS - +1 . Moves all 4s - yes. CAM-ICU - neg . Orientation - x3+      Resolved Hospital Problem list   NA  Assessment & Plan:  1.  Hypotension related to diarrhea, recent med changes:    09/12/2019 - still on levophed  Plan  - MAP goal > 55 wiith sbp > 100  - LR bolus 500cc - levophe for bp goal - Hold cardiac meds until resolved.    #COVID 1 9  - oct 2020 and dec 2020  09/12/2019 - no symptoms other than diarrhea x 2 weeks. ? Has recurrent acute covid v persistent positive test  P;lan  - check covid serologies  - monitor withour remdesivir, toci, decadron  .#Diarrhea: - unclear cause  ? Recurrent acute covid related v diverticlits  Plan  - check stool pcr  #Likely diverticulitis  plan  - change abx to unasyn  - check MRSA PCR and if negative dc vanc  #  Chronic systolic chf ef at 123456:  - appears euvolemic  Plan  - fluid bolus I - hod meds for chf  - restart chf meds when better   - hold off cards cnsult for now  #  Anemia and myelodysplastic syndrome with WBC 64 an dplat 10 with hgb 8 at admit  09/12/2019 - plat better. S/p 1 unit prbc at admit  Plan  - - - PRBC for hgb </= 6.9gm%    - exceptions are   -  if ACS susepcted/confirmed then transfuse for hgb </= 8.0gm%,  or    -  active bleeding with hemodynamic instability, then transfuse regardless of hemoglobin value   At at all times try to transfuse 1 unit prbc as possible with exception of active hemorrhage  # Electrolyte imbalance   09/12/2019 - low mag and high k  Plan  - replete  mag - correct k with lokelma  -     Best practice:  Diet: NPO Pain/Anxiety/Delirium protocol (if indicated): NA VAP protocol (if indicated): NA DVT prophylaxis: scd GI prophylaxis: pepcid Glucose control: monitor Mobility: bedrest Code Status: Full Family Communication: patient   Disposition: ICU     ATTESTATION & SIGNATURE   The patient CLORIA FOIST is critically ill with multiple organ systems failure and requires high complexity decision making for assessment and support, frequent evaluation and titration of therapies, application of advanced monitoring technologies and extensive interpretation of multiple databases.   Critical Care Time devoted to patient care services described in this note is  30  Minutes.  This time reflects time of care of this signee Dr Brand Males. This critical care time does not reflect procedure time, or teaching time or supervisory time of PA/NP/Med student/Med Resident etc but could involve care discussion time     Dr. Brand Males, M.D., [facility].C.P Pulmonary and Critical Care Medicine Staff Physician [place] [facility] Pager: [phone], If no answer or between  15:00h - 7:00h: call [phone]  09/12/2019 10:17 AM   LABS    PULMONARY No results for input(s): PHART, PCO2ART, PO2ART, HCO3, TCO2, O2SAT in the last 168 hours.  Invalid input(s): PCO2, PO2  CBC Recent Labs  Lab 08/19/2019 1702 09/12/19 0406  HGB 8.4* 9.4*  HCT 25.9* 29.2*  WBC 62.8* 75.9*  PLT 10* 41*    COAGULATION Recent Labs  Lab 09/07/2019 2016  INR 1.9*    CARDIAC  No results for input(s): TROPONINI in the last 168 hours. No results for input(s): PROBNP in the last 168 hours.   CHEMISTRY Recent Labs  Lab 09/04/2019 1702 08/25/2019 2016 09/12/19 0406  NA 134*  --  139  K 6.8* 5.1 5.6*  CL 107  --  110  CO2 16*  --  16*  GLUCOSE 100*  --  161*  BUN 71*  --  55*  CREATININE 1.93*  --  1.51*    CALCIUM 7.0*  --  6.6*  MG  --   --  1.6*  PHOS  --   --  5.7*   Estimated Creatinine Clearance: 30.7 mL/min (A) (by C-G formula based on SCr of 1.51 mg/dL (H)).   LIVER Recent Labs  Lab 09/06/2019 1702 09/09/2019 2016  AST 50*  --   ALT 36  --   ALKPHOS 73  --   BILITOT 1.8*  --   PROT 4.2*  --   ALBUMIN 2.5*  --   INR  --  1.9*     INFECTIOUS Recent Labs  Lab 09/01/2019 1714 08/29/2019 2016  LATICACIDVEN 1.1 1.0     ENDOCRINE CBG (last 3)  No results for input(s): GLUCAP in the last 72 hours.       IMAGING x48h  - image(s) personally visualized  -   highlighted in bold CT ABDOMEN PELVIS WO CONTRAST  Result Date: 09/06/2019 CLINICAL DATA:  Hypotension, shock and diarrhea EXAM: CT ABDOMEN AND PELVIS WITHOUT CONTRAST TECHNIQUE: Multidetector CT imaging of the abdomen and pelvis was performed following the standard protocol without IV contrast. COMPARISON:  CT 04/04/2019, chest x-ray 09/08/2019 FINDINGS: Lower chest: Lung bases demonstrate no acute consolidation or pleural effusion. Mild cardiomegaly. Incompletely visualized intracardiac pacing leads. Hepatobiliary: Calcified gallstones. No focal hepatic abnormality or biliary dilatation. Pancreas: Unremarkable. No pancreatic ductal dilatation or surrounding inflammatory changes. Spleen: Borderline enlarged Adrenals/Urinary Tract: Adrenal glands are within normal limits. Kidneys show no hydronephrosis. The bladder is normal Stomach/Bowel: The stomach is nonenlarged. No dilated small bowel. Negative appendix. Diffuse colon diverticula. Focal wall thickening with inflammatory change at the splenic flexure and proximal descending colon. No definite extraluminal gas. More heavily concentrated diverticula at the sigmoid colon. Vascular/Lymphatic: Nonaneurysmal aorta. Mild aortic atherosclerosis. Multiple enlarged mesenteric root and right lower quadrant lymph nodes measuring up to 18 mm. Reproductive: Uterus and bilateral adnexa are  unremarkable. Other: No free air. Small amount of free fluid in the abdomen and pelvis. Generalized subcutaneous edema consistent with anasarca. Musculoskeletal: No acute or suspicious osseous abnormality. IMPRESSION: 1. Focal wall thickening with surrounding inflammatory  change at the splenic flexure and proximal descending colon with diverticula present, findings favored to represent acute diverticulitis over focal colitis. No evidence for perforation. 2. Small amount of abdominal and pelvic ascites. Generalized subcutaneous edema consistent with anasarca 3. Gallstones 4. Slightly enlarged mesenteric root and right lower quadrant lymph nodes which could be reactive or secondary to infection, inflammation, or neoplastic etiology. Electronically Signed   By: Donavan Foil M.D.   On: 08/21/2019 22:08   DG Chest Portable 1 View  Result Date: 08/16/2019 CLINICAL DATA:  Increased medication dose with increasing weakness. EXAM: PORTABLE CHEST 1 VIEW COMPARISON:  07/17/2019 FINDINGS: Left-sided dual lead pacer defibrillator, power pack over left chest leads entering via subclavian approach projecting over cardiac silhouette as before. Cardiomediastinal contours are stable. Lungs are clear though partially obscured on the left due to power pack projecting over the left chest. No signs of pleural effusion. Visualized skeletal structures are unremarkable aside from spinal degenerative changes. IMPRESSION: No acute cardiopulmonary disease. Stable appearance of pacer defibrillator. Electronically Signed   By: Zetta Bills M.D.   On: 08/19/2019 18:18

## 2019-09-12 NOTE — Progress Notes (Signed)
10 beat run of Vtach called to [place]. Patient denies chest pain. Add on mag to [facility] labs per Elink.

## 2019-09-12 NOTE — Progress Notes (Signed)
Hold chemical VTE prophylaxis until platelets > 50 Hx of myelodysplastic syndrome Discussed with MD

## 2019-09-12 NOTE — Progress Notes (Signed)
[facility] Progress Note Patient Name: [name] DOB: [birth year] MRN: [id number]   Date of Service  09/12/2019  HPI/Events of Note  10 beat run of wide complex tachycardia, Pt was asymptomatic, without hemodynamic compromise, recent K+ 5.2, no recent magnesium level  eICU Interventions  Check magnesium level, maintain electrolytes within normal range.        Kerry Kass Victormanuel Mclure 09/12/2019, 9:30 PM

## 2019-09-13 ENCOUNTER — Inpatient Hospital Stay: Payer: Medicare Other

## 2019-09-13 DIAGNOSIS — N179 Acute kidney failure, unspecified: Secondary | ICD-10-CM

## 2019-09-13 DIAGNOSIS — U071 COVID-19: Secondary | ICD-10-CM

## 2019-09-13 LAB — CBC WITH DIFFERENTIAL/PLATELET
Abs Immature Granulocytes: 11.8 10*3/uL — ABNORMAL HIGH (ref 0.00–0.07)
Basophils Absolute: 0 10*3/uL (ref 0.0–0.1)
Basophils Relative: 0 %
Blasts: 11 %
Eosinophils Absolute: 0 10*3/uL (ref 0.0–0.5)
Eosinophils Relative: 0 %
HCT: 25.7 % — ABNORMAL LOW (ref 36.0–46.0)
Hemoglobin: 8.3 g/dL — ABNORMAL LOW (ref 12.0–15.0)
Lymphocytes Relative: 8 %
Lymphs Abs: 8.6 10*3/uL — ABNORMAL HIGH (ref 0.7–4.0)
MCH: 29.6 pg (ref 26.0–34.0)
MCHC: 32.3 g/dL (ref 30.0–36.0)
MCV: 91.8 fL (ref 80.0–100.0)
Metamyelocytes Relative: 6 %
Monocytes Absolute: 14 10*3/uL — ABNORMAL HIGH (ref 0.1–1.0)
Monocytes Relative: 13 %
Myelocytes: 5 %
Neutro Abs: 61.3 10*3/uL — ABNORMAL HIGH (ref 1.7–7.7)
Neutrophils Relative %: 57 %
Platelets: 13 10*3/uL — CL (ref 150–400)
RBC: 2.8 MIL/uL — ABNORMAL LOW (ref 3.87–5.11)
RDW: 25.5 % — ABNORMAL HIGH (ref 11.5–15.5)
WBC: 107.5 10*3/uL (ref 4.0–10.5)
nRBC: 15.1 % — ABNORMAL HIGH (ref 0.0–0.2)
nRBC: 30 /100 WBC — ABNORMAL HIGH

## 2019-09-13 LAB — COMPREHENSIVE METABOLIC PANEL
ALT: 35 U/L (ref 0–44)
AST: 62 U/L — ABNORMAL HIGH (ref 15–41)
Albumin: 2.2 g/dL — ABNORMAL LOW (ref 3.5–5.0)
Alkaline Phosphatase: 87 U/L (ref 38–126)
Anion gap: 8 (ref 5–15)
BUN: 37 mg/dL — ABNORMAL HIGH (ref 8–23)
CO2: 15 mmol/L — ABNORMAL LOW (ref 22–32)
Calcium: 6.4 mg/dL — CL (ref 8.9–10.3)
Chloride: 115 mmol/L — ABNORMAL HIGH (ref 98–111)
Creatinine, Ser: 1.28 mg/dL — ABNORMAL HIGH (ref 0.44–1.00)
GFR calc Af Amer: 48 mL/min — ABNORMAL LOW (ref >60)
GFR calc non Af Amer: 41 mL/min — ABNORMAL LOW (ref >60)
Glucose, Bld: 111 mg/dL — ABNORMAL HIGH (ref 70–99)
Potassium: >7.5 mmol/L (ref 3.5–5.1)
Sodium: 138 mmol/L (ref 135–145)
Total Bilirubin: 1.6 mg/dL — ABNORMAL HIGH (ref 0.3–1.2)
Total Protein: 3.8 g/dL — ABNORMAL LOW (ref 6.5–8.1)

## 2019-09-13 LAB — PATHOLOGIST SMEAR REVIEW

## 2019-09-13 LAB — C DIFFICILE QUICK SCREEN W PCR REFLEX
C Diff antigen: NEGATIVE
C Diff interpretation: NOT DETECTED
C Diff toxin: NEGATIVE

## 2019-09-13 LAB — POTASSIUM: Potassium: 3.9 mmol/L (ref 3.5–5.1)

## 2019-09-13 MED ORDER — SODIUM CHLORIDE 0.9% IV SOLUTION: Freq: Once | INTRAVENOUS | Status: DC

## 2019-09-13 MED ORDER — CALCIUM GLUCONATE-NACL 1-0.675 GM/50ML-% IV SOLN
1.0000 g | Freq: Once | INTRAVENOUS | Status: AC
  Administered 2019-09-13: 13:00:00 1000 mg via INTRAVENOUS
  Filled 2019-09-13: qty 50

## 2019-09-13 MED ORDER — LEVOTHYROXINE SODIUM 100 MCG/5ML IV SOLN
75.0000 ug | Freq: Every day | INTRAVENOUS | Status: DC
  Administered 2019-09-13 – 2019-09-15 (×3): 75 ug via INTRAVENOUS
  Filled 2019-09-13 (×3): qty 5

## 2019-09-13 MED ORDER — LACTATED RINGERS IV BOLUS
500.0000 mL | Freq: Once | INTRAVENOUS | Status: AC
  Administered 2019-09-13: 500 mL via INTRAVENOUS

## 2019-09-13 NOTE — Progress Notes (Signed)
CRITICAL VALUE ALERT  Critical Value:  Platelets 13/ WBC 107.5  Date & Time Notied:  12/31 1200  Provider Notified: MD Ramaswamy  Orders Received/Actions taken: Awaiting further orders.

## 2019-09-13 NOTE — Progress Notes (Signed)
NAME:  [name], MRN:  [id number], DOB:  Feb 01, 1946, LOS: 1 ADMISSION DATE:  08/23/2019, CONSULTATION DATE:  09/12/2019 REFERRING MD: Raelyn Number, CHIEF COMPLAINT:  Hypotension   Brief History    Patient is a 73 yo female sent to ER from Cardiology clinic with hypotension felt secondary to meds including caredilol,  Entresto , lasix and spironalactone.  She also has had some diarrhea.  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 IN the emergency room her systolic is 70 and she is started on levophed.  Most recent K is 5.1, 6.8 at presentation.  Cr 1.93, baseline < 1.  AG is 11, BNP is 307, troponin 5. She also has myelodysplastic syndrome and now hs a wbc  CT 64 with neutrophil predominance hgb of 8.5.  On evaluation she really has no complaints.   Patient's COVID test is positive, will need to go to 3 M.    Past Medical History    has a past medical history of Automatic implantable cardioverter-defibrillator in situ, Cataract, Diverticulosis, Dyslipidemia, Ejection fraction < 50%, GERD (gastroesophageal reflux disease), Hypothyroidism, ICD (implantable cardiac defibrillator) in place, Iron deficiency (09/21/2018), LBBB (left bundle branch block), Macrocytic anemia, MDS (myelodysplastic syndrome) ([place]) (12/27/2018), Mitral regurgitation, Nonischemic cardiomyopathy ([place]), Overweight(278.02), Serrated polyp of colon, and Systolic CHF, chronic ([place]).   has a past surgical history that includes Tonsillectomy (1952); Cardiac defibrillator placement; heart catherization; Cardiac catheterization; Eye surgery (Left); Colonoscopy (N/A, 11/29/2013); Colonoscopy (N/A, 02/25/2015); Esophagogastroduodenoscopy (egd) with propofol (N/A, 08/07/2018); and biopsy (08/07/2018).   Significant Hospital Events   Admission 08/21/2019 [facility]  12/30 -  on some levophed. Reprots diarrhea x 2 weeks. No clustering risk for covid. Was covid + 07/06/2019 and positive again now. Only new interim hx is diarrhea. D/w  Dr Acie Fredrickson  - patient has NICM. Clinical story sounds like dehdyration. Patient says normal sbp > 105. Curerntly feels well. No acute covi symptoms other than diarrha. CT abd suggestive of splenic flexure area diverituculits. AKi better. K better but still high Mag low  Consults:  Cardiology  Procedures:  NA  Significant Diagnostic Tests:  NA  Micro Data:  12/30 MRSA PCR - positive 12/31 - stool pcr >>   Antimicrobials:   Anti-infectives (From admission, onward)   Start     Dose/Rate Route Frequency Ordered Stop   09/12/19 2215  Ampicillin-Sulbactam (UNASYN) 3 g in sodium chloride 0.9 % 100 mL IVPB     3 g 200 mL/hr over 30 Minutes Intravenous Every 8 hours 09/12/19 2202     09/12/19 1900  ceFEPIme (MAXIPIME) 1 g in sodium chloride 0.9 % 100 mL IVPB  Status:  Discontinued     1 g 200 mL/hr over 30 Minutes Intravenous Every 24 hours 08/24/2019 1928 09/12/19 1052   09/12/19 1100  Ampicillin-Sulbactam (UNASYN) 3 g in sodium chloride 0.9 % 100 mL IVPB  Status:  Discontinued     3 g 200 mL/hr over 30 Minutes Intravenous Every 12 hours 09/12/19 1052 09/12/19 2202   08/15/2019 2000  vancomycin (VANCOREADY) IVPB 1250 mg/250 mL     1,250 mg 166.7 mL/hr over 90 Minutes Intravenous Every 36 hours 08/26/2019 1928     09/01/2019 1900  ceFEPIme (MAXIPIME) 2 g in sodium chloride 0.9 % 100 mL IVPB     2 g 200 mL/hr over 30 Minutes Intravenous  Once 08/25/2019 1845 09/05/2019 2052   08/27/2019 1900  metroNIDAZOLE (FLAGYL) IVPB 500 mg     500 mg 100 mL/hr over 60  Minutes Intravenous  Once 08/27/2019 1845 09/05/2019 2052   09/09/2019 1900  vancomycin (VANCOCIN) IVPB 1000 mg/200 mL premix  Status:  Discontinued     1,000 mg 200 mL/hr over 60 Minutes Intravenous  Once 08/28/2019 1845 08/28/2019 1928       Interim history/subjective:  09/13/2019 - stil with diarrhea. PCR sent. Creat better but K high and improved on repeat. Got fluid ysteday. Came off levophed but bp low again and back on levophed this morning. On room air. On  unasyn for splenic flexure diveritculitis. Lovenox on hold due to MDS related low platelet  Objective   Blood pressure (!) 94/43, pulse 70, temperature 98 F (36.7 C), temperature source Oral, resp. rate 16, height 4\' 11"  (1.499 m), weight 88.5 kg, SpO2 100 %.        Intake/Output Summary (Last 24 hours) at 09/13/2019 1133 Last data filed at 09/13/2019 0700 Gross per 24 hour  Intake 3685.99 ml  Output 200 ml  Net 3485.99 ml   Filed Weights   09/12/19 0459 09/13/19 0500  Weight: 82 kg 88.5 kg    General Appearance:  Looks well. Siiting in bed. Texting Head:  Normocephalic, without obvious abnormality, atraumatic Eyes:  PERRL - yes, conjunctiva/corneas - muddy     Ears:  Normal external ear canals, both ears Nose:  G tube - no Throat:  ETT TUBE - no , OG tube - no Neck:  Supple,  No enlargement/tenderness/nodules Lungs: Clear to auscultation bilaterally, Heart:  S1 and S2 normal, no murmur, CVP - x.  Pressors - levophed + Abdomen:  Soft, no masses, no organomegaly Genitalia / Rectal:  Not done Extremities:  Extremities- intact with some eema Skin:  ntact in exposed areas . Sacral area - not examined Neurologic:  Sedation - none -> RASS - +1 . Moves all 4s - yes. CAM-ICU - neg . Orientation - x3+      Resolved Hospital Problem list   NA  Assessment & Plan:  1.  Hypotension related to diarrhea, recent med changes:    09/13/2019 - back on levophed; low dose   Plan  - MAP goal > 55 wiith sbp > 95  - LR bolus 500cc x 1 rpeat again 09/13/2019 - levophe for bp goal - Hold cardiac meds until resolved.    #COVID 1 9  - oct 2020 and dec 2020  09/13/2019 - no symptoms other than diarrhea x 2 weeks. ? Has recurrent acute covid v persistent positive test  P;lan  - await covid serologies  - monitor withour remdesivir, toci, decadron  .#Diarrhea: - unclear cause  ? Recurrent acute covid related v diverticlits  09/13/2019 - still l +  Plan  -await stool pcr  - await  c diff  #Likely diverticulitis  plan  - uansyn  - vanc -   #  Chronic systolic chf ef at 123456:  - appears euvolemic but story c/w dehydration  Plan  - fluid bolus I - hod meds for chf  - restart chf meds when better   - hold off cards cnsult for now  #  Anemia and myelodysplastic syndrome with WBC 64 an dplat 10 with hgb 8 at admit  09/13/2019 - plat better as of 12/30. S/p 1 unit prbc at admit  Plan  - - - PRBC for hgb </= 6.9gm%    - exceptions are   -  if ACS susepcted/confirmed then transfuse for hgb </= 8.0gm%,  or    -  active  bleeding with hemodynamic instability, then transfuse regardless of hemoglobin value   At at all times try to transfuse 1 unit prbc as possible with exception of active hemorrhage  - recheck cbc today  # Electrolyte imbalance   09/13/2019 -false positive hypoerkalemia. Has low calciium  Plan  -calcium gluconate x 1    Best practice:  Diet: NPO Pain/Anxiety/Delirium protocol (if indicated): NA VAP protocol (if indicated): NA DVT prophylaxis: scd GI prophylaxis: pepcid Glucose control: monitor Mobility: bedrest Code Status: Full Family Communication: patient   Disposition: ICU    ATTESTATION & SIGNATURE   The patient [name] is critically ill with multiple organ systems failure and requires high complexity decision making for assessment and support, frequent evaluation and titration of therapies, application of advanced monitoring technologies and extensive interpretation of multiple databases.   Critical Care Time devoted to patient care services described in this note is  30  Minutes. This time reflects time of care of this signee Dr Brand Males. This critical care time does not reflect procedure time, or teaching time or supervisory time of PA/NP/Med student/Med Resident etc but could involve care discussion time     Dr. Brand Males, M.D., [facility].C.P Pulmonary and Critical Care Medicine Staff Physician [place] [facility] Pager: [phone], If no answer or between  15:00h - 7:00h: call [phone]  09/13/2019 11:46 AM    LABS    PULMONARY No results for input(s): PHART, PCO2ART, PO2ART, HCO3, TCO2, O2SAT in the last 168 hours.  Invalid input(s): PCO2, PO2  CBC Recent Labs  Lab 09/06/2019 1702 09/12/19 0406  HGB 8.4* 9.4*  HCT 25.9* 29.2*  WBC 62.8* 75.9*  PLT 10* 41*    COAGULATION Recent Labs  Lab 08/21/2019 2016  INR 1.9*    CARDIAC  No results for input(s): TROPONINI in the last 168 hours. No results for input(s): PROBNP in the last 168 hours.   CHEMISTRY Recent Labs  Lab 08/30/2019 1702 09/12/19 0406 09/12/19 2000 09/12/19 2011 09/13/19 0500 09/13/19 0833  NA 134* 139 140  --  138  --   K 6.8* 5.6* 5.2*  --  >7.5* 3.9  CL 107 110 115*  --  115*  --   CO2 16* 16* 18*  --  15*  --   GLUCOSE 100* 161* 146*  --  111*  --   BUN 71* 55* 44*  --  37*  --   CREATININE 1.93* 1.51* 1.24*  --  1.28*  --   CALCIUM 7.0* 6.6* 6.5*  --  6.4*  --   MG  --  1.6*  --  2.2  --   --   PHOS  --  5.7*  --   --   --   --    Estimated Creatinine Clearance: 37.9 mL/min (A) (by C-G formula based on SCr of 1.28 mg/dL (H)).   LIVER Recent Labs  Lab 08/16/2019 1702 08/30/2019 2016 09/13/19 0500  AST 50*  --  62*  ALT 36  --  35  ALKPHOS 73  --  87  BILITOT 1.8*  --  1.6*  PROT 4.2*  --  3.8*  ALBUMIN 2.5*  --  2.2*  INR  --  1.9*  --      INFECTIOUS Recent Labs  Lab 08/29/2019 1714 08/31/2019 2016  LATICACIDVEN 1.1 1.0     ENDOCRINE CBG (last 3)  Recent Labs    09/12/19 2013  GLUCAP 134*         IMAGING x48h  - image(s) personally visualized  -   highlighted in bold CT ABDOMEN PELVIS WO CONTRAST  Result Date: 08/30/2019 CLINICAL DATA:  Hypotension, shock and diarrhea EXAM: CT ABDOMEN AND PELVIS WITHOUT CONTRAST TECHNIQUE: Multidetector CT imaging of the abdomen and pelvis was performed following the standard  protocol without IV contrast. COMPARISON:  CT 04/04/2019, chest x-ray 09/13/2019 FINDINGS: Lower chest: Lung bases demonstrate no acute consolidation or pleural effusion. Mild cardiomegaly. Incompletely visualized intracardiac pacing leads. Hepatobiliary: Calcified gallstones. No focal hepatic abnormality or biliary dilatation. Pancreas: Unremarkable. No pancreatic ductal dilatation or surrounding inflammatory changes. Spleen: Borderline enlarged Adrenals/Urinary Tract: Adrenal glands are within normal limits. Kidneys show no hydronephrosis. The bladder is normal Stomach/Bowel: The stomach is nonenlarged. No dilated small bowel. Negative appendix. Diffuse colon diverticula. Focal wall thickening with inflammatory change at the splenic flexure and proximal descending colon. No definite extraluminal gas. More heavily concentrated diverticula at the sigmoid colon. Vascular/Lymphatic: Nonaneurysmal aorta. Mild aortic atherosclerosis. Multiple enlarged mesenteric root and right lower quadrant lymph nodes measuring up to 18 mm. Reproductive: Uterus and bilateral adnexa are unremarkable. Other: No free air. Small amount of free fluid in the abdomen and pelvis. Generalized subcutaneous edema consistent with anasarca. Musculoskeletal: No acute or suspicious osseous abnormality. IMPRESSION: 1. Focal wall thickening with surrounding inflammatory change at the splenic flexure and proximal descending colon with diverticula present, findings favored to represent acute diverticulitis over focal colitis. No evidence for perforation. 2. Small amount of abdominal and pelvic ascites. Generalized subcutaneous edema consistent with anasarca 3. Gallstones 4. Slightly enlarged mesenteric root and right lower quadrant lymph nodes which could be reactive or secondary to infection, inflammation, or neoplastic etiology. Electronically Signed   By: Donavan Foil M.D.   On: 08/15/2019 22:08   DG Chest Portable 1 View  Result Date:  09/02/2019 CLINICAL DATA:  Increased medication dose with increasing weakness. EXAM: PORTABLE CHEST 1 VIEW COMPARISON:  07/17/2019 FINDINGS: Left-sided dual lead pacer defibrillator, power pack over left chest leads entering via subclavian approach projecting over cardiac silhouette as before. Cardiomediastinal contours are stable. Lungs are clear though partially obscured on the left due to power pack projecting over the left chest. No signs of pleural effusion. Visualized skeletal structures are unremarkable aside from spinal degenerative changes. IMPRESSION: No acute cardiopulmonary disease. Stable appearance of pacer defibrillator. Electronically Signed   By: Zetta Bills M.D.   On: 09/08/2019 18:18

## 2019-09-13 NOTE — Progress Notes (Signed)
   Recent Labs  Lab 08/20/2019 1702 09/12/19 0406 09/13/19 1214  HGB 8.4* 9.4* 8.3*  HCT 25.9* 29.2* 25.7*  WBC 62.8* 75.9* 107.5*  PLT 10* 41* 13*   Plat down to 13K  Chart reiewed - MDS type 1  Dr Irene Limbo has given her platelet at this level  Plan  - 1 unit platelet - will attempt to reach Dr Irene Limbo or his team    SIGNATURE    Dr. Brand Males, M.D., F.C.C.P,  Pulmonary and Critical Care Medicine Staff Physician, [place] Director - Interstitial Lung Disease  Program  Pulmonary [place] at [place], [place], [address]  Pager: [phone], If no answer or between  15:00h - 7:00h: call [phone] Telephone: [phone]  4:20 PM 09/13/2019

## 2019-09-14 ENCOUNTER — Inpatient Hospital Stay: Payer: Self-pay

## 2019-09-14 LAB — PREPARE PLATELET PHERESIS: Unit division: 0

## 2019-09-14 LAB — CBC WITH DIFFERENTIAL/PLATELET
Abs Immature Granulocytes: 26.93 10*3/uL — ABNORMAL HIGH (ref 0.00–0.07)
Basophils Absolute: 1.4 10*3/uL — ABNORMAL HIGH (ref 0.0–0.1)
Basophils Relative: 1 %
Eosinophils Absolute: 0 10*3/uL (ref 0.0–0.5)
Eosinophils Relative: 0 %
HCT: 26.3 % — ABNORMAL LOW (ref 36.0–46.0)
Hemoglobin: 8.6 g/dL — ABNORMAL LOW (ref 12.0–15.0)
Immature Granulocytes: 20 %
Lymphocytes Relative: 1 %
Lymphs Abs: 1.8 10*3/uL (ref 0.7–4.0)
MCH: 29.8 pg (ref 26.0–34.0)
MCHC: 32.7 g/dL (ref 30.0–36.0)
MCV: 91 fL (ref 80.0–100.0)
Monocytes Absolute: 40.6 10*3/uL — ABNORMAL HIGH (ref 0.1–1.0)
Monocytes Relative: 31 %
Neutro Abs: 61.2 10*3/uL — ABNORMAL HIGH (ref 1.7–7.7)
Neutrophils Relative %: 47 %
Platelets: 15 10*3/uL — CL (ref 150–400)
RBC: 2.89 MIL/uL — ABNORMAL LOW (ref 3.87–5.11)
RDW: 25.2 % — ABNORMAL HIGH (ref 11.5–15.5)
WBC: 132 10*3/uL (ref 4.0–10.5)
nRBC: 11 % — ABNORMAL HIGH (ref 0.0–0.2)

## 2019-09-14 LAB — COMPREHENSIVE METABOLIC PANEL
ALT: 32 U/L (ref 0–44)
AST: 44 U/L — ABNORMAL HIGH (ref 15–41)
Albumin: 2.3 g/dL — ABNORMAL LOW (ref 3.5–5.0)
Alkaline Phosphatase: 97 U/L (ref 38–126)
Anion gap: 9 (ref 5–15)
BUN: 22 mg/dL (ref 8–23)
CO2: 19 mmol/L — ABNORMAL LOW (ref 22–32)
Calcium: 6.8 mg/dL — ABNORMAL LOW (ref 8.9–10.3)
Chloride: 115 mmol/L — ABNORMAL HIGH (ref 98–111)
Creatinine, Ser: 0.93 mg/dL (ref 0.44–1.00)
GFR calc Af Amer: >60 mL/min (ref >60)
GFR calc non Af Amer: >60 mL/min (ref >60)
Glucose, Bld: 82 mg/dL (ref 70–99)
Potassium: 6.2 mmol/L — ABNORMAL HIGH (ref 3.5–5.1)
Sodium: 143 mmol/L (ref 135–145)
Total Bilirubin: 1.1 mg/dL (ref 0.3–1.2)
Total Protein: 4.1 g/dL — ABNORMAL LOW (ref 6.5–8.1)

## 2019-09-14 LAB — TYPE AND SCREEN
ABO/RH(D): O POS
Antibody Screen: NEGATIVE

## 2019-09-14 LAB — BPAM PLATELET PHERESIS
Blood Product Expiration Date: 202101022359
ISSUE DATE / TIME: 202012311655
Unit Type and Rh: 2800

## 2019-09-14 LAB — GLUCOSE, CAPILLARY: Glucose-Capillary: 123 mg/dL — ABNORMAL HIGH (ref 70–99)

## 2019-09-14 MED ORDER — CALCIUM GLUCONATE-NACL 1-0.675 GM/50ML-% IV SOLN
1.0000 g | Freq: Once | INTRAVENOUS | Status: AC
  Administered 2019-09-14: 1000 mg via INTRAVENOUS
  Filled 2019-09-14: qty 50

## 2019-09-14 MED ORDER — SODIUM CHLORIDE 0.9% FLUSH
3.0000 mL | Freq: Two times a day (BID) | INTRAVENOUS | Status: DC
  Administered 2019-09-14 – 2019-09-17 (×5): 3 mL via INTRAVENOUS

## 2019-09-14 MED ORDER — SODIUM CHLORIDE 0.9 % IV SOLN
250.0000 mL | INTRAVENOUS | Status: DC | PRN
  Administered 2019-09-16: 250 mL via INTRAVENOUS

## 2019-09-14 MED ORDER — SODIUM CHLORIDE 0.9% FLUSH: 3.0000 mL | INTRAVENOUS | Status: DC | PRN

## 2019-09-14 MED ORDER — MUPIROCIN 2 % EX OINT
1.0000 "application " | TOPICAL_OINTMENT | Freq: Two times a day (BID) | CUTANEOUS | Status: DC
  Administered 2019-09-14 – 2019-09-17 (×7): 1 via NASAL
  Filled 2019-09-14: qty 22

## 2019-09-14 NOTE — Progress Notes (Signed)
Spoke with pt re PICC order.  Reviewed risks and benefits with pt.  Verbalizes consent for PICC placement, aware it can be placed once platelets are improved per order.

## 2019-09-14 NOTE — Progress Notes (Signed)
Spoke with Autumn RN re PICC order.  Platelets today are 15.  Will reevaluate for PICC placement on 09/15/19.  Autumn RN states 2 PIV working well at this time.

## 2019-09-14 NOTE — Progress Notes (Signed)
NAME:  [name], MRN:  [id number], DOB:  [birth year], LOS: 2 ADMISSION DATE:  08/15/2019, CONSULTATION DATE:  08/24/2019 REFERRING MD: Raelyn Number, CHIEF COMPLAINT:  Hypotension    Brief History   Patient is a 74 yo female sent to ER from Cardiology clinic with hypotension felt secondary to meds including Entresto.    History of present illness   Patient is a 74 yo female sent to ER from Cardiology clinic with hypotension felt secondary to meds including caredilol,  Entresto , lasix and spironalactone.  She also has had some diarrhea.  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 IN the emergency room her systolic is 70 and she is started on levophed.  Most recent K is 5.1, 6.8 at presentation.  Cr 1.93, baseline < 1.  AG is 11, BNP is 307, troponin 5. She also has myelodysplastic syndrome and now hs a wbc  CT 64 with neutrophil predominance hgb of 8.5.  On evaluation she really has no complaints.   Patient's COVID test is positive, will need to go to 3 M.  Past Medical History    has a past medical history of Automatic implantable cardioverter-defibrillator in situ, Cataract, Diverticulosis, Dyslipidemia, Ejection fraction < 50%, GERD (gastroesophageal reflux disease), Hypothyroidism, ICD (implantable cardiac defibrillator) in place, Iron deficiency (09/21/2018), LBBB (left bundle branch block), Macrocytic anemia, MDS (myelodysplastic syndrome) ([place]) (12/27/2018), Mitral regurgitation, Nonischemic cardiomyopathy ([place]), Overweight(278.02), Serrated polyp of colon, and Systolic CHF, chronic ([place]).   has a past surgical history that includes Tonsillectomy (1952); Cardiac defibrillator placement; heart catherization; Cardiac catheterization; Eye surgery (Left); Colonoscopy (N/A, 11/29/2013); Colonoscopy (N/A, 02/25/2015); Esophagogastroduodenoscopy (egd) with propofol (N/A, 08/07/2018); and biopsy (08/07/2018).   Significant Hospital Events   12/29 Admitted  12/30 -  on some levophed. Reprots  diarrhea x 2 weeks. No clustering risk for covid. Was covid + 07/06/2019 and positive again now. Only new interim hx is diarrhea. D/w  Dr Acie Fredrickson - patient has NICM. Clinical story sounds like dehdyration. Patient says normal sbp > 105. Curerntly feels well. No acute covi symptoms other than diarrha. CT abd suggestive of splenic flexure area diverituculits. AKi better. K better but still high Mag low  Consults:  Cardiology  Procedures:  NA  Significant Diagnostic Tests:  CT ABD 12/29 > 1. Focal wall thickening with surrounding inflammatory change at the splenic flexure and proximal descending colon with diverticula present, findings favored to represent acute diverticulitis over focal colitis. No evidence for perforation. 2. Small amount of abdominal and pelvic ascites. Generalized subcutaneous edema consistent with anasarca 3. Gallstones 4. Slightly enlarged mesenteric root and right lower quadrant lymph nodes which could be reactive or secondary to infection, inflammation, or neoplastic etiology.  CXR 12/29 >No acute cardiopulmonary disease. Stable appearance of pacer defibrillator.  Micro Data:  12/30 MRSA PCR - positive COVID 12/29 > positive  12/31 stool pcr >  C-diff 12/31 > negative  Blood culture 12/29 > negative  Urine culture 12/29 > negative   Antimicrobials:   Anti-infectives (From admission, onward)   Start     Dose/Rate Route Frequency Ordered Stop   09/12/19 2215  Ampicillin-Sulbactam (UNASYN) 3 g in sodium chloride 0.9 % 100 mL IVPB     3 g 200 mL/hr over 30 Minutes Intravenous Every 8 hours 09/12/19 2202     09/12/19 1900  ceFEPIme (MAXIPIME) 1 g in sodium chloride 0.9 % 100 mL IVPB  Status:  Discontinued     1 g 200 mL/hr over 30 Minutes  Intravenous Every 24 hours 09/12/2019 1928 09/12/19 1052   09/12/19 1100  Ampicillin-Sulbactam (UNASYN) 3 g in sodium chloride 0.9 % 100 mL IVPB  Status:  Discontinued     3 g 200 mL/hr over 30 Minutes Intravenous Every 12  hours 09/12/19 1052 09/12/19 2202   08/19/2019 2000  vancomycin (VANCOREADY) IVPB 1250 mg/250 mL  Status:  Discontinued     1,250 mg 166.7 mL/hr over 90 Minutes Intravenous Every 36 hours 08/27/2019 1928 09/14/19 1010   08/15/2019 1900  ceFEPIme (MAXIPIME) 2 g in sodium chloride 0.9 % 100 mL IVPB     2 g 200 mL/hr over 30 Minutes Intravenous  Once 08/15/2019 1845 08/19/2019 2052   09/07/2019 1900  metroNIDAZOLE (FLAGYL) IVPB 500 mg     500 mg 100 mL/hr over 60 Minutes Intravenous  Once 09/08/2019 1845 09/09/2019 2052   09/01/2019 1900  vancomycin (VANCOCIN) IVPB 1000 mg/200 mL premix  Status:  Discontinued     1,000 mg 200 mL/hr over 60 Minutes Intravenous  Once 08/17/2019 1845 08/16/2019 1928       Interim history/subjective:  12/31- stil with diarrhea. PCR sent. Creat better but K high and improved on repeat. Got fluid ysteday. Came off levophed but bp low again and back on levophed this morning. On room air. On unasyn for splenic flexure diveritculitis. Lovenox on hold due to MDS related low platelet  Objective   Blood pressure (!) 108/46, pulse 80, temperature 98.4 F (36.9 C), temperature source Axillary, resp. rate 18, height 4\' 11"  (1.499 m), weight 90.3 kg, SpO2 96 %.        Intake/Output Summary (Last 24 hours) at 09/14/2019 1012 Last data filed at 09/14/2019 0800 Gross per 24 hour  Intake 4670.99 ml  Output 600 ml  Net 4070.99 ml   Filed Weights   09/12/19 0459 09/13/19 0500 09/14/19 0416  Weight: 82 kg 88.5 kg 90.3 kg   Physical exam  General: Chronically ill appearing elderly female lying in bed, in NAD HEENT: [place]/AT, MM pink/moist, PERRL,  Neuro: Alert and oriented, able to follow commands, non-focal  CV: s1s2 regular rate and rhythm, no murmur, rubs, or gallops,  PULM:  Clear to ascultation bilaterally, no increased work of breathing, on [place]  GI: soft, bowel sounds active in all 4 quadrants, non-tender, non-distended Extremities: warm/dry, 2+ upper extremity edema  Skin: no rashes or  lesions   Resolved Hospital Problem list   NA  Assessment & Plan:  Hypotension with known chronic systolic CHF  -related to diarrhea, recent med changes of increased Entrestos:   -Repeat echocardiogram from 08/28/2019 with low but stable LVEF at 20 to 25% therefore patients Entresto was increased to 49/51 P: MAP goal greater than 55, SBP goal >95 Continue pressor support, low dose  Hold home Coreg, Lasix, Entresto, and Spironolactone   Chronic systolic chf ef at 123456 - appears euvolemic but story c/w dehydration P: Hold home medications  Cardiology aware of admission, holding on formal consult for now  Monitor volume status  COVID 1 9   -oct 2020 and dec 2020 -no symptoms other than diarrhea x 2 weeks. ? Has recurrent acute covid v persistent positive test P: Monitor without remdesivir, toci, and decadron  Remains asymptomatic, on room air   Diarrhea - unclear cause ? Recurrent acute covid related v diverticlits P Await stool PRC C-diff negative  IV hydration  Supportive care  Likely diverticulitis -CT ABD with Focal wall thickening with surrounding inflammatory change at the splenic flexure  and proximal descending colon with diverticula present, findings favored to represent acute diverticulitis  P: Continue IV Unasyn Gentle IV hydration  Supportive care  Anemia and myelodysplastic syndrome with WBC 64 an dplat 10 with hgb 8 at admit -S/P one unit plt since admission  -Will touch base with hematology, WBC 107.5, plt 13 as of 12/31 P: Transfuse per protocol Trend CBC  Transfuse plt for count less than 13  Electrolyte imbalance P: Trend daily Supplement as needed    Best practice:  Diet: Reg Pain/Anxiety/Delirium protocol (if indicated): NA VAP protocol (if indicated): NA DVT prophylaxis: scd GI prophylaxis: pepcid Glucose control: monitor Mobility: bedrest Code Status: Full Family Communication: patient   Disposition: ICU  ATTESTATION &  [place], NP-C [facility] Contact / Pager information can be found on Amion  09/14/2019, 10:39 AM   LABS    PULMONARY No results for input(s): PHART, PCO2ART, PO2ART, HCO3, TCO2, O2SAT in the last 168 hours.  Invalid input(s): PCO2, PO2  CBC Recent Labs  Lab 09/05/2019 1702 09/12/19 0406 09/13/19 1214  HGB 8.4* 9.4* 8.3*  HCT 25.9* 29.2* 25.7*  WBC 62.8* 75.9* 107.5*  PLT 10* 41* 13*    COAGULATION Recent Labs  Lab 08/23/2019 2016  INR 1.9*    CARDIAC  No results for input(s): TROPONINI in the last 168 hours. No results for input(s): PROBNP in the last 168 hours.   CHEMISTRY Recent Labs  Lab 08/20/2019 1702 09/12/19 0406 09/12/19 2000 09/12/19 2011 09/13/19 0500 09/13/19 0833  NA 134* 139 140  --  138  --   K 6.8* 5.6* 5.2*  --  >7.5* 3.9  CL 107 110 115*  --  115*  --   CO2 16* 16* 18*  --  15*  --   GLUCOSE 100* 161* 146*  --  111*  --   BUN 71* 55* 44*  --  37*  --   CREATININE 1.93* 1.51* 1.24*  --  1.28*  --   CALCIUM 7.0* 6.6* 6.5*  --  6.4*  --   MG  --  1.6*  --  2.2  --   --   PHOS  --  5.7*  --   --   --   --    Estimated Creatinine Clearance: 38.3 mL/min (A) (by C-G formula based on SCr of 1.28 mg/dL (H)).   LIVER Recent Labs  Lab 08/24/2019 1702 09/08/2019 2016 09/13/19 0500  AST 50*  --  62*  ALT 36  --  35  ALKPHOS 73  --  87  BILITOT 1.8*  --  1.6*  PROT 4.2*  --  3.8*  ALBUMIN 2.5*  --  2.2*  INR  --  1.9*  --      INFECTIOUS Recent Labs  Lab 08/21/2019 1714 08/26/2019 2016  LATICACIDVEN 1.1 1.0     ENDOCRINE CBG (last 3)  Recent Labs    09/12/19 2013  GLUCAP 134*

## 2019-09-14 NOTE — Progress Notes (Signed)
Got words from RN - that patient desires to remain full code  Plan  - full code  - continue goals of care discussion    SIGNATURE    Dr. Brand Males, M.D., F.C.C.P,  Pulmonary and Critical Care Medicine Staff Physician, [place] Director - Interstitial Lung Disease  Program  Pulmonary [place] at [place], [place], [address]  Pager: [phone], If no answer or between  15:00h - 7:00h: call [phone] Telephone: [phone]  3:47 PM 09/14/2019

## 2019-09-14 DEATH — deceased

## 2019-09-15 LAB — CBC WITH DIFFERENTIAL/PLATELET
Abs Immature Granulocytes: 28.01 10*3/uL — ABNORMAL HIGH (ref 0.00–0.07)
Basophils Absolute: 2.2 10*3/uL — ABNORMAL HIGH (ref 0.0–0.1)
Basophils Relative: 2 %
Eosinophils Absolute: 0 10*3/uL (ref 0.0–0.5)
Eosinophils Relative: 0 %
HCT: 26 % — ABNORMAL LOW (ref 36.0–46.0)
Hemoglobin: 8.8 g/dL — ABNORMAL LOW (ref 12.0–15.0)
Immature Granulocytes: 19 %
Lymphocytes Relative: 2 %
Lymphs Abs: 2.3 10*3/uL (ref 0.7–4.0)
MCH: 30.1 pg (ref 26.0–34.0)
MCHC: 33.8 g/dL (ref 30.0–36.0)
MCV: 89 fL (ref 80.0–100.0)
Monocytes Absolute: 50 10*3/uL — ABNORMAL HIGH (ref 0.1–1.0)
Monocytes Relative: 35 %
Neutro Abs: 62 10*3/uL — ABNORMAL HIGH (ref 1.7–7.7)
Neutrophils Relative %: 42 %
Platelets: 16 10*3/uL — CL (ref 150–400)
RBC: 2.92 MIL/uL — ABNORMAL LOW (ref 3.87–5.11)
RDW: 25.1 % — ABNORMAL HIGH (ref 11.5–15.5)
WBC: 144.5 10*3/uL (ref 4.0–10.5)
nRBC: 9.6 % — ABNORMAL HIGH (ref 0.0–0.2)

## 2019-09-15 LAB — CALCIUM, IONIZED: Calcium, Ionized, Serum: 3.8 mg/dL — ABNORMAL LOW (ref 4.5–5.6)

## 2019-09-15 MED ORDER — LEVOTHYROXINE SODIUM 75 MCG PO TABS
150.0000 ug | ORAL_TABLET | Freq: Every day | ORAL | Status: DC
  Administered 2019-09-16 – 2019-09-17 (×2): 150 ug via ORAL
  Filled 2019-09-15 (×2): qty 2

## 2019-09-15 MED ORDER — FAMOTIDINE 20 MG PO TABS
20.0000 mg | ORAL_TABLET | Freq: Every day | ORAL | Status: DC
  Administered 2019-09-16 – 2019-09-17 (×2): 20 mg via ORAL
  Filled 2019-09-15 (×2): qty 1

## 2019-09-15 MED ORDER — FUROSEMIDE 10 MG/ML IJ SOLN
40.0000 mg | Freq: Once | INTRAMUSCULAR | Status: AC
  Administered 2019-09-15: 40 mg via INTRAVENOUS
  Filled 2019-09-15: qty 4

## 2019-09-15 NOTE — Progress Notes (Signed)
Secure chat with Dr Chase Caller re PICC placement with platelet count low.  States prefers to wait until tomorrow platelet results to place PICC.  Will check back 09/16/19

## 2019-09-15 NOTE — Progress Notes (Signed)
Pharmacy Antibiotic Note  [name] is a 74 y.o. female admitted on 08/28/2019 with sepsis.  Pharmacy has been consulted for Unasyn dosing for diverticulitis. Patient is now on day 5 of IV therapy for diverticulitis and is tolerating a diet now. Discussed transition to oral therapy but given the patients diffuse volume overload will wait a day before transitioning to orals for concern over absorption.   Plan: Unasyn 3g IV q8h  Follow for transition to Augmentin in next few days  Temp (24hrs), Avg:98.4 F (36.9 C), Min:97.6 F (36.4 C), Max:99.5 F (37.5 C)  Recent Labs  Lab 08/26/2019 1702 08/18/2019 1714 08/15/2019 2016 09/12/19 0406 09/12/19 2000 09/13/19 0500 09/13/19 1214 09/14/19 1249 09/15/19 0414  WBC 62.8*  --   --  75.9*  --   --  107.5* 132.0* 144.5*  CREATININE 1.93*  --   --  1.51* 1.24* 1.28*  --  0.93  --   LATICACIDVEN  --  1.1 1.0  --   --   --   --   --   --     Estimated Creatinine Clearance: 52.9 mL/min (by C-G formula based on SCr of 0.93 mg/dL).    No Known Allergies  Antimicrobials this admission: Vanc 12/29 >> 12/31 Cefepime 12/29 >> 12/29 Flagyl 12/29 >>12/29 Unasyn 12/30>>  Thank you for allowing pharmacy to be a part of this patient's care.  Nicoletta Dress, PharmD PGY2 Infectious Disease Pharmacy Resident  Please see AMION for all Pharmacists' Contact Phone Numbers 09/15/2019, 10:53 AM

## 2019-09-15 NOTE — Progress Notes (Signed)
NAME:  [name], MRN:  [id number], DOB:  1945-11-22, LOS: 3 ADMISSION DATE:  08/28/2019, CONSULTATION DATE:  08/22/2019 REFERRING MD: Raelyn Number, CHIEF COMPLAINT:  Hypotension   Brief History    Patient is a 74 yo female sent to ER from Cardiology clinic with hypotension felt secondary to meds including caredilol,  Entresto , lasix and spironalactone.  She also has had some diarrhea.  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 IN the emergency room her systolic is 70 and she is started on levophed.  Most recent K is 5.1, 6.8 at presentation.  Cr 1.93, baseline < 1.  AG is 11, BNP is 307, troponin 5. She also has myelodysplastic syndrome and now hs a wbc  CT 64 with neutrophil predominance hgb of 8.5.  On evaluation she really has no complaints.   Patient's COVID test is positive, will need to go to 3 M.    Past Medical History    has a past medical history of Automatic implantable cardioverter-defibrillator in situ, Cataract, Diverticulosis, Dyslipidemia, Ejection fraction < 50%, GERD (gastroesophageal reflux disease), Hypothyroidism, ICD (implantable cardiac defibrillator) in place, Iron deficiency (09/21/2018), LBBB (left bundle branch block), Macrocytic anemia, MDS (myelodysplastic syndrome) ([place]) (12/27/2018), Mitral regurgitation, Nonischemic cardiomyopathy ([place]), Overweight(278.02), Serrated polyp of colon, and Systolic CHF, chronic ([place]).   has a past surgical history that includes Tonsillectomy (1952); Cardiac defibrillator placement; heart catherization; Cardiac catheterization; Eye surgery (Left); Colonoscopy (N/A, 11/29/2013); Colonoscopy (N/A, 02/25/2015); Esophagogastroduodenoscopy (egd) with propofol (N/A, 08/07/2018); and biopsy (08/07/2018).   Significant Hospital Events   Admission 08/26/2019 [facility]  12/30 -  on some levophed. Reprots diarrhea x 2 weeks. No clustering risk for covid. Was covid + 07/06/2019 and positive again now. Only new interim hx is diarrhea. D/w  Dr Acie Fredrickson  - patient has NICM. Clinical story sounds like dehdyration. Patient says normal sbp > 105. Curerntly feels well. No acute covi symptoms other than diarrha. CT abd suggestive of splenic flexure area diverituculits. AKi better. K better but still high Mag low  12/31 status post platelet transfusion for platelet count 13,000  1./1 - - stil with diarrhea. PCR sent. Creat better but K high and improved on repeat. Got fluid ysteday. Came off levophed but bp low again and back on levophed this morning. On room air. On unasyn for splenic flexure diveritculitis. Lovenox on hold due to MDS related low platelet.  Poor platelet response.  Consults:  Cardiology  Procedures:  NA  Significant Diagnostic Tests:  NA  Micro Data:  10/23 - covid + 12/29 - cofid + with diarrha 12/30 MRSA PCR - positive 12/30 - stool pcr >> (cdiff negatve)   Antimicrobials:   Anti-infectives (From admission, onward)   Start     Dose/Rate Route Frequency Ordered Stop   09/12/19 2215  Ampicillin-Sulbactam (UNASYN) 3 g in sodium chloride 0.9 % 100 mL IVPB     3 g 200 mL/hr over 30 Minutes Intravenous Every 8 hours 09/12/19 2202     09/12/19 1900  ceFEPIme (MAXIPIME) 1 g in sodium chloride 0.9 % 100 mL IVPB  Status:  Discontinued     1 g 200 mL/hr over 30 Minutes Intravenous Every 24 hours 09/05/2019 1928 09/12/19 1052   09/12/19 1100  Ampicillin-Sulbactam (UNASYN) 3 g in sodium chloride 0.9 % 100 mL IVPB  Status:  Discontinued     3 g 200 mL/hr over 30 Minutes Intravenous Every 12 hours 09/12/19 1052 09/12/19 2202   08/14/2019 2000  vancomycin (VANCOREADY)  IVPB 1250 mg/250 mL  Status:  Discontinued     1,250 mg 166.7 mL/hr over 90 Minutes Intravenous Every 36 hours 08/23/2019 1928 09/14/19 1010   08/15/2019 1900  ceFEPIme (MAXIPIME) 2 g in sodium chloride 0.9 % 100 mL IVPB     2 g 200 mL/hr over 30 Minutes Intravenous  Once 09/09/2019 1845 08/19/2019 2052   08/31/2019 1900  metroNIDAZOLE (FLAGYL) IVPB 500 mg     500 mg 100  mL/hr over 60 Minutes Intravenous  Once 08/19/2019 1845 08/25/2019 2052   09/01/2019 1900  vancomycin (VANCOCIN) IVPB 1000 mg/200 mL premix  Status:  Discontinued     1,000 mg 200 mL/hr over 60 Minutes Intravenous  Once 09/05/2019 1845 09/04/2019 1928       Interim history/subjective:  09/15/2019 -acute kidney injury has rib resolved.  Platelet continues to be low at 16,000.  PICC line ordered but they want a platelet count greater than 50,000.  WBC count continues to climb now greater than 144,000.  She has edema diffusely.  She is now hypoxemic at 1-2 L of oxygen.  She is feeling fine otherwise.  Diarrhea still present but improved.  Stool PCR still pending though C. difficile is negative.  We discussed goals of care and CODE STATUS: She agrees to no CPR because she does not want to be a vegetable.  Objective   Blood pressure (!) 105/56, pulse 89, temperature 97.6 F (36.4 C), temperature source Axillary, resp. rate 16, height 4\' 11"  (1.499 m), weight 90.6 kg, SpO2 98 %.        Intake/Output Summary (Last 24 hours) at 09/15/2019 1020 Last data filed at 09/15/2019 1000 Gross per 24 hour  Intake 1360.55 ml  Output --  Net 1360.55 ml   Filed Weights   09/13/19 0500 09/14/19 0416 09/15/19 0500  Weight: 88.5 kg 90.3 kg 90.6 kg   General Appearance:  Looks deconditioned. MIld anasarca + Head:  Normocephalic, without obvious abnormality, atraumatic Eyes:  PERRL - yes, conjunctiva/corneas - mudd     Ears:  Normal external ear canals, both ears Nose:  G tube - no Throat:  ETT TUBE - no , OG tube - no Neck:  Supple,  No enlargement/tenderness/nodules Lungs: Clear to auscultation bilaterally, Some basal crackles +. No distress Heart:  S1 and S2 normal, no murmur, CVP - x.  Pressors - levophed 67mcg with MAP 79 Abdomen:  Soft, no masses, no organomegaly Genitalia / Rectal:  Not done Extremities:  Extremities- edema Skin:  ntact in exposed areas . Sacral area - not examined Neurologic:  Sedation - none  -> RASS - +1 . Moves all 4s - yes. CAM-ICU - neg . Orientation - x3+          Resolved Hospital Problem list   NA  Assessment & Plan:  1.  Hypotension related to diarrhea, recent med changes:    09/15/2019 -on Levophed 5 mcg.  Mean arterial pressures greater than 79.  At this point she appears fluid overloaded.  Plan  - MAP goal > 55 wiith sbp > 95 - levophe for bp goal - Hold cardiac meds until resolved.    #COVID 1 9  - oct 2020 and dec 2020 -suspect she is a gentleman reinfection given the fact she has diarrhea of unclear cause plus minus diverticulitis for the diarrhea  09/15/2019 -diarrhea slowly improving  P;lan  - await covid serologies  - monitor withour remdesivir, toci, decadron  .#Diarrhea: - unclear cause  ? Recurrent acute  covid related v diverticlits  09/15/2019 - still l + but improved C. difficile negative  Plan  -await stool pcr from September 12, 2019  #Likely diverticulitis  plan  - Jake Seats -but can change to p.o. when diffuse edema better   #  Chronic systolic chf ef at 123456:  -Now appears volume overloaded September 15, 2019  Plan  -Lasix 40 mg x 1 on September 15, 2019- hod meds for chf  - restart chf meds when better   - hold off cards cnsult for now   #Acute hypoxemic respiratory failure  -New onset September 14, 2019 and persistent September 15, 2019.  Likely due to volume overload as opposed to Covid  plqn  -Oxygen for pulse ox greater than 88% -Incentive spirometry -Lasix x1 -Chest x-ray September 15, 2019  #  Anemia and myelodysplastic syndrome with WBC 64 an dplat 10 with hgb 8 at admit  09/15/2019 -status post platelet transfusion September 13, 2019 without any response.  Platelet continues to be low  Plan  - - - PRBC for hgb </= 6.9gm%    - exceptions are   -  if ACS susepcted/confirmed then transfuse for hgb </= 8.0gm%,  or    -  active bleeding with hemodynamic instability, then transfuse regardless of hemoglobin value   At at all times  try to transfuse 1 unit prbc as possible with exception of active hemorrhage  -Platelet transfusion if platelet count less than 10,000  -PICC line can be placed if platelet count is adequate  -Might end up needing heme consult for overall prognosis with the myelodysplastic syndrome [?:  How long can we keep giving recurrent platelets]   # Electrolyte imbalance   09/15/2019  -no chemistries today Plan  -Recheck and monitor   Best practice:  Diet: NPO Pain/Anxiety/Delirium protocol (if indicated): NA VAP protocol (if indicated): NA DVT prophylaxis: scd GI prophylaxis: pepcid Glucose control: monitor Mobility: bedrest Code Status:  -Discussed with patient: She says the [facility] can make decisions for her and patient does not have capacity.  Currently discussed CODE STATUS.  She says she does not want a vegetable.  The living will reflect that.  At this point in time discussed current prognosis which is highly uncertain.  She agreed to no CPR but at this point she will remain okay for short-term intubation and vasopressors.  Once you get a handle on her myelodysplastic syndrome and if indeed that is irreversible and poor prognosis should probably go to a full DNR.  Nevertheless she still has hope she will get better and go home so continue medical care including pressors  Family Communication: patient (DPOA was updated on September 14, 2019]  Disposition: ICU    ATTESTATION & SIGNATURE   The patient [name] is critically ill with multiple organ systems failure and requires high complexity decision making for assessment and support, frequent evaluation and titration of therapies, application of advanced monitoring technologies and extensive interpretation of multiple databases.   Critical Care Time devoted to patient care services described in this note is  30  Minutes. This time reflects time of care of this signee Dr Brand Males. This critical care time does not reflect procedure  time, or teaching time or supervisory time of PA/NP/Med student/Med Resident etc but could involve care discussion time     Dr. Brand Males, M.D., [facility].C.P Pulmonary and Critical Care Medicine Staff Physician [facility] Pager: [phone], If no answer  or between  15:00h - 7:00h: call [phone]  09/15/2019 10:20 AM    LABS    PULMONARY No results for input(s): PHART, PCO2ART, PO2ART, HCO3, TCO2, O2SAT in the last 168 hours.  Invalid input(s): PCO2, PO2  CBC Recent Labs  Lab 09/13/19 1214 09/14/19 1249 09/15/19 0414  HGB 8.3* 8.6* 8.8*  HCT 25.7* 26.3* 26.0*  WBC 107.5* 132.0* 144.5*  PLT 13* 15* 16*    COAGULATION Recent Labs  Lab 09/08/2019 2016  INR 1.9*    CARDIAC  No results for input(s): TROPONINI in the last 168 hours. No results for input(s): PROBNP in the last 168 hours.   CHEMISTRY Recent Labs  Lab 08/28/2019 1702 09/12/19 0406 09/12/19 2000 09/12/19 2011 09/13/19 0500 09/13/19 0833 09/14/19 1249  NA 134* 139 140  --  138  --  143  K 6.8* 5.6* 5.2*  --  >7.5* 3.9 6.2*  CL 107 110 115*  --  115*  --  115*  CO2 16* 16* 18*  --  15*  --  19*  GLUCOSE 100* 161* 146*  --  111*  --  82  BUN 71* 55* 44*  --  37*  --  22  CREATININE 1.93* 1.51* 1.24*  --  1.28*  --  0.93  CALCIUM 7.0* 6.6* 6.5*  --  6.4*  --  6.8*  MG  --  1.6*  --  2.2  --   --   --   PHOS  --  5.7*  --   --   --   --   --    Estimated Creatinine Clearance: 52.9 mL/min (by C-G formula based on SCr of 0.93 mg/dL).   LIVER Recent Labs  Lab 09/04/2019 1702 08/17/2019 2016 09/13/19 0500 09/14/19 1249  AST 50*  --  62* 44*  ALT 36  --  35 32  ALKPHOS 73  --  87 97  BILITOT 1.8*  --  1.6* 1.1  PROT 4.2*  --  3.8* 4.1*  ALBUMIN 2.5*  --  2.2* 2.3*  INR  --  1.9*  --   --      INFECTIOUS Recent Labs  Lab 08/21/2019 1714 08/27/2019 2016  LATICACIDVEN 1.1 1.0     ENDOCRINE CBG (last 3)  Recent Labs    09/12/19 2013  09/14/19 2327  GLUCAP 134* 123*         IMAGING x48h  - image(s) personally visualized  -   highlighted in bold [place] EKG SITE RITE  Result Date: 09/14/2019 If Site Rite image not attached, placement could not be confirmed due to current cardiac rhythm.

## 2019-09-16 ENCOUNTER — Inpatient Hospital Stay (HOSPITAL_COMMUNITY): Payer: Medicare Other

## 2019-09-16 LAB — COMPREHENSIVE METABOLIC PANEL
ALT: 33 U/L (ref 0–44)
AST: 42 U/L — ABNORMAL HIGH (ref 15–41)
Albumin: 2.4 g/dL — ABNORMAL LOW (ref 3.5–5.0)
Alkaline Phosphatase: 112 U/L (ref 38–126)
Anion gap: 8 (ref 5–15)
BUN: 16 mg/dL (ref 8–23)
CO2: 26 mmol/L (ref 22–32)
Calcium: 7.4 mg/dL — ABNORMAL LOW (ref 8.9–10.3)
Chloride: 104 mmol/L (ref 98–111)
Creatinine, Ser: 0.58 mg/dL (ref 0.44–1.00)
GFR calc Af Amer: >60 mL/min (ref >60)
GFR calc non Af Amer: >60 mL/min (ref >60)
Glucose, Bld: 96 mg/dL (ref 70–99)
Potassium: 3.6 mmol/L (ref 3.5–5.1)
Sodium: 138 mmol/L (ref 135–145)
Total Bilirubin: 2.1 mg/dL — ABNORMAL HIGH (ref 0.3–1.2)
Total Protein: 4.5 g/dL — ABNORMAL LOW (ref 6.5–8.1)

## 2019-09-16 LAB — CBC WITH DIFFERENTIAL/PLATELET
Abs Immature Granulocytes: 29.4 10*3/uL — ABNORMAL HIGH (ref 0.00–0.07)
Band Neutrophils: 24 %
Basophils Absolute: 0 10*3/uL (ref 0.0–0.1)
Basophils Relative: 0 %
Blasts: 2 %
Eosinophils Absolute: 0 10*3/uL (ref 0.0–0.5)
Eosinophils Relative: 0 %
HCT: 23.5 % — ABNORMAL LOW (ref 36.0–46.0)
Hemoglobin: 7.5 g/dL — ABNORMAL LOW (ref 12.0–15.0)
Lymphocytes Relative: 10 %
Lymphs Abs: 17.3 10*3/uL — ABNORMAL HIGH (ref 0.7–4.0)
MCH: 28.8 pg (ref 26.0–34.0)
MCHC: 31.9 g/dL (ref 30.0–36.0)
MCV: 90.4 fL (ref 80.0–100.0)
Metamyelocytes Relative: 10 %
Monocytes Absolute: 8.6 10*3/uL — ABNORMAL HIGH (ref 0.1–1.0)
Monocytes Relative: 5 %
Myelocytes: 7 %
Neutro Abs: 114 10*3/uL — ABNORMAL HIGH (ref 1.7–7.7)
Neutrophils Relative %: 42 %
Platelets: 29 10*3/uL — CL (ref 150–400)
RBC: 2.6 MIL/uL — ABNORMAL LOW (ref 3.87–5.11)
RDW: 25.2 % — ABNORMAL HIGH (ref 11.5–15.5)
WBC: 172.7 10*3/uL (ref 4.0–10.5)
nRBC: 10.1 % — ABNORMAL HIGH (ref 0.0–0.2)
nRBC: 15 /100 WBC — ABNORMAL HIGH

## 2019-09-16 LAB — PROTIME-INR
INR: 1.9 — ABNORMAL HIGH (ref 0.8–1.2)
Prothrombin Time: 21.5 seconds — ABNORMAL HIGH (ref 11.4–15.2)

## 2019-09-16 LAB — GI PATHOGEN PANEL BY PCR, STOOL

## 2019-09-16 LAB — CULTURE, BLOOD (ROUTINE X 2)
Culture: NO GROWTH
Culture: NO GROWTH
Special Requests: ADEQUATE

## 2019-09-16 LAB — CORTISOL: Cortisol, Plasma: 27.4 ug/dL

## 2019-09-16 LAB — MAGNESIUM: Magnesium: 1.6 mg/dL — ABNORMAL LOW (ref 1.7–2.4)

## 2019-09-16 LAB — BILIRUBIN, DIRECT: Bilirubin, Direct: 0.5 mg/dL — ABNORMAL HIGH (ref 0.0–0.2)

## 2019-09-16 LAB — PHOSPHORUS: Phosphorus: 1.9 mg/dL — ABNORMAL LOW (ref 2.5–4.6)

## 2019-09-16 LAB — BRAIN NATRIURETIC PEPTIDE: B Natriuretic Peptide: 1012 pg/mL — ABNORMAL HIGH (ref 0.0–100.0)

## 2019-09-16 MED ORDER — POTASSIUM CHLORIDE CRYS ER 20 MEQ PO TBCR: 40.0000 meq | EXTENDED_RELEASE_TABLET | Freq: Once | ORAL | Status: DC

## 2019-09-16 MED ORDER — SODIUM CHLORIDE 0.9% FLUSH: 10.0000 mL | INTRAVENOUS | Status: DC | PRN

## 2019-09-16 MED ORDER — SODIUM CHLORIDE 0.9% FLUSH
10.0000 mL | Freq: Two times a day (BID) | INTRAVENOUS | Status: DC
  Administered 2019-09-16 – 2019-09-17 (×2): 10 mL

## 2019-09-16 MED ORDER — FUROSEMIDE 10 MG/ML IJ SOLN
40.0000 mg | Freq: Two times a day (BID) | INTRAMUSCULAR | Status: DC
  Administered 2019-09-16: 40 mg via INTRAVENOUS
  Filled 2019-09-16: qty 4

## 2019-09-16 MED ORDER — MAGNESIUM SULFATE 4 GM/100ML IV SOLN
4.0000 g | Freq: Once | INTRAVENOUS | Status: AC
  Administered 2019-09-16: 4 g via INTRAVENOUS
  Filled 2019-09-16: qty 100

## 2019-09-16 MED ORDER — SODIUM CHLORIDE 0.9% IV SOLUTION: Freq: Once | INTRAVENOUS | Status: AC

## 2019-09-16 MED ORDER — POTASSIUM PHOSPHATES 15 MMOLE/5ML IV SOLN
20.0000 mmol | Freq: Once | INTRAVENOUS | Status: AC
  Administered 2019-09-16: 20 mmol via INTRAVENOUS
  Filled 2019-09-16: qty 6.67

## 2019-09-16 MED ORDER — PHENYLEPHRINE HCL-NACL 10-0.9 MG/250ML-% IV SOLN
INTRAVENOUS | Status: AC
  Filled 2019-09-16: qty 250

## 2019-09-16 MED ORDER — PHENYLEPHRINE HCL-NACL 10-0.9 MG/250ML-% IV SOLN
0.0000 ug/min | INTRAVENOUS | Status: AC
  Administered 2019-09-16: 30 ug/min via INTRAVENOUS
  Administered 2019-09-17: 70 ug/min via INTRAVENOUS
  Administered 2019-09-17 (×2): 40 ug/min via INTRAVENOUS
  Filled 2019-09-16 (×3): qty 250

## 2019-09-16 NOTE — Progress Notes (Signed)
[facility] Progress Note Patient Name: [name] DOB: 02/16/1946 MRN: [id number]   Date of Service  09/16/2019  HPI/Events of Note  Patient is tachycardic (ST) to 120s. BP 92/72 (MAP 80) on 60 mcg/min of Neo. They have systolic heart failure, volume overload, and a 2-4L [facility] requirement. They do not have increased WOB. I note that their Hgb has dropped to 7.5 today from 8.8 yesterday and they are also very thrombocytopenic which raises possibility of bleeding as cause of ST + hypotension.  eICU Interventions  Check STAT CBC.  Phenylephrine not optimal for systolic CHF but was changed intentionally from  Levophed earlier in the day due to ST.     Intervention Category Intermediate Interventions: Hypotension - evaluation and management;Other:  Charlott Rakes 09/16/2019, 11:16 PM

## 2019-09-16 NOTE — Progress Notes (Signed)
Called to the bedside because patient has increasing Levophed requirements now to 10 mcg.  In addition patient is tachycardic with a heart rate of 130.  EKG shows interventricular conduction delay but nothing acute.  She got lot of Lasix today  She has now had a PICC line and post PICC line she had labs done.  The PICC line itself was done after platelet transfusion and the platelet shows the response.  She significant electrolyte imbalance with mildly low potassium but significantly low phosphorus and also magnesium  Sinus tachycardia due to Levophed associated with electrolyte imbalance. Increase Levophed needs could be because of Lasix  Plan  -Stop Lasix -Change Levophed to Neo-Synephrine understanding there is a cardiomyopathy and she might need to go back on Levophed -Correct magnesium, and phosphorus -Check cortisol and if this is low she will need hydrocortisone -Check troponin tomorrow     LABS    PULMONARY No results for input(s): PHART, PCO2ART, PO2ART, HCO3, TCO2, O2SAT in the last 168 hours.  Invalid input(s): PCO2, PO2  CBC Recent Labs  Lab 09/14/19 1249 09/15/19 0414 09/16/19 1638  HGB 8.6* 8.8* 7.5*  HCT 26.3* 26.0* 23.5*  WBC 132.0* 144.5* 172.7*  PLT 15* 16* 29*    COAGULATION Recent Labs  Lab 09/05/2019 2016 09/16/19 1638  INR 1.9* 1.9*    CARDIAC  No results for input(s): TROPONINI in the last 168 hours. No results for input(s): PROBNP in the last 168 hours.   CHEMISTRY Recent Labs  Lab 09/12/19 0406 09/12/19 2000 09/12/19 2011 09/13/19 0500 09/14/19 1249 09/16/19 1638  NA 139 140  --  138 143 138  K 5.6* 5.2*  --  >7.5* 6.2* 3.6  CL 110 115*  --  115* 115* 104  CO2 16* 18*  --  15* 19* 26  GLUCOSE 161* 146*  --  111* 82 96  BUN 55* 44*  --  37* 22 16  CREATININE 1.51* 1.24*  --  1.28* 0.93 0.58  CALCIUM 6.6* 6.5*  --  6.4* 6.8* 7.4*  MG 1.6*  --  2.2  --   --  1.6*  PHOS 5.7*  --   --   --   --  1.9*   Estimated Creatinine  Clearance: 60.9 mL/min (by C-G formula based on SCr of 0.58 mg/dL).   LIVER Recent Labs  Lab 09/10/2019 1702 08/19/2019 2016 09/13/19 0500 09/14/19 1249 09/16/19 1638  AST 50*  --  62* 44* 42*  ALT 36  --  35 32 33  ALKPHOS 73  --  87 97 112  BILITOT 1.8*  --  1.6* 1.1 2.1*  PROT 4.2*  --  3.8* 4.1* 4.5*  ALBUMIN 2.5*  --  2.2* 2.3* 2.4*  INR  --  1.9*  --   --  1.9*     INFECTIOUS Recent Labs  Lab 09/07/2019 1714 08/30/2019 2016  LATICACIDVEN 1.1 1.0     ENDOCRINE CBG (last 3)  Recent Labs    09/14/19 2327  GLUCAP 123*         IMAGING x48h  - image(s) personally visualized  -   highlighted in bold DG CHEST PORT 1 VIEW  Result Date: 09/16/2019 CLINICAL DATA:  Acute respiratory failure with hypoxia. EXAM: PORTABLE CHEST 1 VIEW COMPARISON:  September 11, 2019. FINDINGS: Stable cardiomegaly with central pulmonary vascular congestion is noted. No pneumothorax is noted. No significant pleural effusion is noted. Mild bibasilar subsegmental atelectasis is noted. Left-sided pacemaker is unchanged in position. Bony thorax  is unremarkable. IMPRESSION: Stable cardiomegaly with central pulmonary vascular congestion. Mild bibasilar subsegmental atelectasis is noted. Electronically Signed   By: Marijo Conception M.D.   On: 09/16/2019 08:56

## 2019-09-16 NOTE — Progress Notes (Signed)
NAME:  [name], MRN:  [id number], DOB:  Aug 11, 1946, LOS: 4 ADMISSION DATE:  08/27/2019, CONSULTATION DATE:  09/01/2019 REFERRING MD: Raelyn Number, CHIEF COMPLAINT:  Hypotension   Brief History    Patient is a 74 yo female sent to ER from Cardiology clinic with hypotension felt secondary to meds including caredilol,  Entresto , lasix and spironalactone.  She also has had some diarrhea.  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 IN the emergency room her systolic is 70 and she is started on levophed.  Most recent K is 5.1, 6.8 at presentation.  Cr 1.93, baseline < 1.  AG is 11, BNP is 307, troponin 5. She also has myelodysplastic syndrome and now hs a wbc  CT 64 with neutrophil predominance hgb of 8.5.  On evaluation she really has no complaints.   Patient's COVID test is positive, will need to go to 3 M.    Past Medical History    has a past medical history of Automatic implantable cardioverter-defibrillator in situ, Cataract, Diverticulosis, Dyslipidemia, Ejection fraction < 50%, GERD (gastroesophageal reflux disease), Hypothyroidism, ICD (implantable cardiac defibrillator) in place, Iron deficiency (09/21/2018), LBBB (left bundle branch block), Macrocytic anemia, MDS (myelodysplastic syndrome) ([place]) (12/27/2018), Mitral regurgitation, Nonischemic cardiomyopathy ([place]), Overweight(278.02), Serrated polyp of colon, and Systolic CHF, chronic ([place]).   has a past surgical history that includes Tonsillectomy (1952); Cardiac defibrillator placement; heart catherization; Cardiac catheterization; Eye surgery (Left); Colonoscopy (N/A, 11/29/2013); Colonoscopy (N/A, 02/25/2015); Esophagogastroduodenoscopy (egd) with propofol (N/A, 08/07/2018); and biopsy (08/07/2018).   Significant Hospital Events   Admission 08/25/2019 [facility]  12/30 -  on some levophed. Reprots diarrhea x 2 weeks. No clustering risk for covid. Was covid + 07/06/2019 and positive again now. Only new interim hx is diarrhea. D/w  Dr Acie Fredrickson  - patient has NICM. Clinical story sounds like dehdyration. Patient says normal sbp > 105. Curerntly feels well. No acute covi symptoms other than diarrha. CT abd suggestive of splenic flexure area diverituculits. AKi better. K better but still high Mag low  12/31 status post platelet transfusion for platelet count 13,000  1./1 - - stil with diarrhea. PCR sent. Creat better but K high and improved on repeat. Got fluid ysteday. Came off levophed but bp low again and back on levophed this morning. On room air. On unasyn for splenic flexure diveritculitis. Lovenox on hold due to MDS related low platelet.  Poor platelet response.  1./2- -acute kidney injury has rib resolved.  Platelet continues to be low at 16,000.  PICC line ordered but they want a platelet count greater than 50,000.  WBC count continues to climb now greater than 144,000.  She has edema diffusely.  She is now hypoxemic at 1-2 L of oxygen.  She is feeling fine otherwise.  Diarrhea still present but improved.  Stool PCR still pending though C. difficile is negative.  We discussed goals of care and CODE STATUS: She agrees to no CPR because she does not want to be a vegetable.  Consults:  Cardiology  Procedures:  NA  Significant Diagnostic Tests:  NA  Micro Data:  10/23 - covid + 12/29 - cofid + with diarrha 12/30 MRSA PCR - positive 12/31- stool pcr >> (cdiff negatve)   Antimicrobials:  unasync 12/30-  Cefepime 12/29 - 12/30 vanc 12/29 - 1/1 Flagyl 12/29 - 12/29      Interim history/subjective:  09/16/2019  - diarreha improved.  Still on Levophed 5 mcg but mean arterial pressures greater than 75.  Systolic is greater than [id number].  The goal is systolic greater than 95 with a mean artery pressure greater than 55.  Unable to get blood draws because of significant edema.  Did respond to diuresis yesterday.  Appetite is okay.  She is in bed.  Stool C. difficile PCR still pending.  Still on 2 L nasal cannula desaturates to  88%.  Objective   Blood pressure 105/70, pulse 99, temperature 97.7 F (36.5 C), temperature source Oral, resp. rate (!) 22, height 4\' 11"  (1.499 m), weight 89.3 kg, SpO2 95 %.        Intake/Output Summary (Last 24 hours) at 09/16/2019 1038 Last data filed at 09/16/2019 1000 Gross per 24 hour  Intake 852.1 ml  Output 1350 ml  Net -497.9 ml   Filed Weights   09/14/19 0416 09/15/19 0500 09/16/19 0500  Weight: 90.3 kg 90.6 kg 89.3 kg  General Appearance:  Looks well ill OBESE - yes Head:  Normocephalic, without obvious abnormality, atraumatic Eyes:  PERRL - yes, conjunctiva/corneas - muddy     Ears:  Normal external ear canals, both ears Nose:  G tube - no Throat:  ETT TUBE - no , OG tube - no Neck:  Supple,  No enlargement/tenderness/nodules Lungs: Clear to auscultation bilaterally, V Heart:  S1 and S2 normal, no murmur, CVP - no.  Pressors -Levophed 5 mcg Abdomen:  Soft, no masses, no organomegaly Genitalia / Rectal:  Not done Extremities:  Extremities-edema Skin:  ntact in exposed areas . Sacral area -not examined Neurologic:  Sedation -none-> RASS -+1. Moves all 4s -yes. CAM-ICU -negative. Orientation -x3             Resolved Hospital Problem list   NA  Assessment & Plan:  1.  Hypotension related to diarrhea, recent med changes:    09/16/2019 -on Levophed 5 mcg.  Mean arterial pressures greater than 79.  At this point she appears fluid overloaded.  Plan  - MAP goal > 55 wiith sbp > 95 -discussed with registered nurse. - levophe for bp goal as above - Hold cardiac meds until resolved.    #COVID 1 9  - oct 2020 and dec 2020 -suspect she is a gentleman reinfection given the fact she has diarrhea of unclear cause plus minus diverticulitis for the diarrhea  09/16/2019 diarrhea improving   P;lan  - await covid serologies  - monitor withour remdesivir, toci, decadron  .#Diarrhea: - unclear cause  ? Recurrent acute covid related v diverticlits  09/16/2019 - still l  + but improved C. difficile negative  Plan  -await stool pcr from September 13, 2019 [need to figure out if it was really sent]  #Likely diverticulitis  -Clinically improving  plan  - Jake Seats -but can change to p.o. when diffuse edema better   #  Chronic systolic chf ef at [id number]:  -Now appears volume overloaded September 15, 2019.  The same exam persists on January 3  Plan  -Lasix 40 mg x 1 on September 15, 2019- hod meds for chf  - restart chf meds when better   - hold off cards cnsult for now   #Acute hypoxemic respiratory failure  -New onset September 14, 2019 and persistent September 16, 2019.  Likely due to volume overload as opposed to Covid.  Chest x-ray consistent with volume overload  plqn  -Oxygen for pulse ox greater than 88% -Incentive spirometry -Lasix 40 mg twice daily -we will give potassium x1.  #  Anemia and myelodysplastic syndrome with  WBC 64 an dplat 10 with hgb 8 at admit  09/16/2019 -status post platelet transfusion September 13, 2019 without any response.  Platelet continues to be low.  Unable to do phlebotomy  Plan  - - - PRBC for hgb </= 6.9gm%    - exceptions are   -  if ACS susepcted/confirmed then transfuse for hgb </= 8.0gm%,  or    -  active bleeding with hemodynamic instability, then transfuse regardless of hemoglobin value   At at all times try to transfuse 1 unit prbc as possible with exception of active hemorrhage  -Platelet transfusion if platelet count less than 10,000 (will transfuse for unknown platelet count in September 16, 2019 with a view towards getting a PICC line]  -PICC line can be placed if platelet count is adequate  -Might end up needing heme consult for overall prognosis with the myelodysplastic syndrome [?:  How long can we keep giving recurrent platelets]   # Electrolyte imbalance   09/16/2019  -no chemistries today due to difficult phlebotomy Plan  -Recheck and monitor   Global: Difficult IV access.  She has only 2 peripherals.   Diffuse edema making phlebotomy impossible.  PICC team was ready to do a PICC yesterday with low platelet count.  But given the risk of bleeding I advised to hold off till the platelet count today was available.  However today unable to do phlebotomy.  At this point discussed risk, benefits and limitations with the care plan with the patient.  We will give a unit of platelets and then PICC team will be able to place a PICC straight after that.  Patient explained about significant bleeding risk with this approach but overall benefit outweighs risk and she is excepted and willing to proceed.  Best practice:  Diet: NPO Pain/Anxiety/Delirium protocol (if indicated): NA VAP protocol (if indicated): NA DVT prophylaxis: scd GI prophylaxis: pepcid Glucose control: monitor Mobility: bedrest -> will aim for out of bed Code Status:  -Discussed with patient: She says the [facility] can make decisions for her and patient does not have capacity.  Currently discussed CODE STATUS.  She says she does not want a vegetable.  The living will reflect that.  At this point in time discussed current prognosis which is highly uncertain.  She agreed to no CPR but at this point she will remain okay for short-term intubation and vasopressors.  Once you get a handle on her myelodysplastic syndrome and if indeed that is irreversible and poor prognosis should probably go to a full DNR.  Nevertheless she still has hope she will get better and go home so continue medical care including pressors  Family Communication: patient on January 2 and January 3 (DPOA was updated on September 14, 2019]  Disposition: ICU    ATTESTATION & SIGNATURE   The patient [name] is critically ill with multiple organ systems failure and requires high complexity decision making for assessment and support, frequent evaluation and titration of therapies, application of advanced monitoring technologies and extensive interpretation of multiple databases.    Critical Care Time devoted to patient care services described in this note is  30  Minutes. This time reflects time of care of this signee Dr Brand Males. This critical care time does not reflect procedure time, or teaching time or supervisory time of PA/NP/Med student/Med Resident etc but could involve care discussion time     Dr. Brand Males, M.D., [facility] [place].C.P Pulmonary and Critical Care Medicine Staff Physician [place]  [place] [facility] Pager: [phone], If no answer or between  15:00h - 7:00h: call [phone]  09/16/2019 10:38 AM    LABS    PULMONARY No results for input(s): PHART, PCO2ART, PO2ART, HCO3, TCO2, O2SAT in the last 168 hours.  Invalid input(s): PCO2, PO2  CBC Recent Labs  Lab 09/13/19 1214 09/14/19 1249 09/15/19 0414  HGB 8.3* 8.6* 8.8*  HCT 25.7* 26.3* 26.0*  WBC 107.5* 132.0* 144.5*  PLT 13* 15* 16*    COAGULATION Recent Labs  Lab 08/16/2019 2016  INR 1.9*    CARDIAC  No results for input(s): TROPONINI in the last 168 hours. No results for input(s): PROBNP in the last 168 hours.   CHEMISTRY Recent Labs  Lab 08/27/2019 1702 09/12/19 0406 09/12/19 2000 09/12/19 2011 09/13/19 0500 09/13/19 0833 09/14/19 1249  NA 134* 139 140  --  138  --  143  K 6.8* 5.6* 5.2*  --  >7.5* 3.9 6.2*  CL 107 110 115*  --  115*  --  115*  CO2 16* 16* 18*  --  15*  --  19*  GLUCOSE 100* 161* 146*  --  111*  --  82  BUN 71* 55* 44*  --  37*  --  22  CREATININE 1.93* 1.51* 1.24*  --  1.28*  --  0.93  CALCIUM 7.0* 6.6* 6.5*  --  6.4*  --  6.8*  MG  --  1.6*  --  2.2  --   --   --   PHOS  --  5.7*  --   --   --   --   --    Estimated Creatinine Clearance: 52.4 mL/min (by C-G formula based on SCr of 0.93 mg/dL).   LIVER Recent Labs  Lab 08/18/2019 1702 09/07/2019 2016 09/13/19 0500 09/14/19 1249  AST 50*  --  62* 44*  ALT 36  --  35 32  ALKPHOS 73  --  87 97  BILITOT 1.8*  --  1.6* 1.1  PROT 4.2*  --  3.8* 4.1*   ALBUMIN 2.5*  --  2.2* 2.3*  INR  --  1.9*  --   --      INFECTIOUS Recent Labs  Lab 09/05/2019 1714 09/07/2019 2016  LATICACIDVEN 1.1 1.0     ENDOCRINE CBG (last 3)  Recent Labs    09/14/19 2327  GLUCAP 123*         IMAGING x48h  - image(s) personally visualized  -   highlighted in bold DG CHEST PORT 1 VIEW  Result Date: 09/16/2019 CLINICAL DATA:  Acute respiratory failure with hypoxia. EXAM: PORTABLE CHEST 1 VIEW COMPARISON:  September 11, 2019. FINDINGS: Stable cardiomegaly with central pulmonary vascular congestion is noted. No pneumothorax is noted. No significant pleural effusion is noted. Mild bibasilar subsegmental atelectasis is noted. Left-sided pacemaker is unchanged in position. Bony thorax is unremarkable. IMPRESSION: Stable cardiomegaly with central pulmonary vascular congestion. Mild bibasilar subsegmental atelectasis is noted. Electronically Signed   By: Marijo Conception M.D.   On: 09/16/2019 08:56   [place] EKG SITE RITE  Result Date: 09/14/2019 If Site Rite image not attached, placement could not be confirmed due to current cardiac rhythm.

## 2019-09-16 NOTE — Progress Notes (Signed)
Peripherally Inserted Central Catheter/Midline Placement  The IV Nurse has discussed with the patient and/or persons authorized to consent for the patient, the purpose of this procedure and the potential benefits and risks involved with this procedure.  The benefits include less needle sticks, lab draws from the catheter, and the patient may be discharged home with the catheter. Risks include, but not limited to, infection, bleeding, blood clot (thrombus formation), and puncture of an artery; nerve damage and irregular heartbeat and possibility to perform a PICC exchange if needed/ordered by physician.  Alternatives to this procedure were also discussed.  Bard Power PICC patient education guide, fact sheet on infection prevention and patient information card has been provided to patient /or left at bedside.    PICC/Midline Placement Documentation  PICC Double Lumen 09/16/19 PICC Right Cephalic 39 cm 0 cm (Active)  Indication for Insertion or Continuance of Line Poor Vasculature-patient has had multiple peripheral attempts or PIVs lasting less than 24 hours 09/16/19 1423  Exposed Catheter (cm) 0 cm 09/16/19 1423  Site Assessment Clean;Dry;Intact 09/16/19 1423  Lumen #1 Status Flushed;Blood return noted;Saline locked 09/16/19 1423  Lumen #2 Status Flushed;Blood return noted;Saline locked 09/16/19 1423  Dressing Type Transparent 09/16/19 1423  Dressing Status Clean;Dry;Intact;Antimicrobial disc in place 09/16/19 1423  Dressing Change Due 09/23/19 09/16/19 1423       Scotty Court 09/16/2019, 2:25 PM

## 2019-09-17 ENCOUNTER — Other Ambulatory Visit: Payer: Self-pay

## 2019-09-17 DIAGNOSIS — R579 Shock, unspecified: Secondary | ICD-10-CM

## 2019-09-17 LAB — PREPARE PLATELET PHERESIS: Unit division: 0

## 2019-09-17 LAB — TROPONIN I (HIGH SENSITIVITY)
Troponin I (High Sensitivity): 73 ng/L — ABNORMAL HIGH (ref <18)
Troponin I (High Sensitivity): 96 ng/L — ABNORMAL HIGH (ref <18)
Troponin I (High Sensitivity): 97 ng/L — ABNORMAL HIGH (ref <18)

## 2019-09-17 LAB — COOXEMETRY PANEL
Carboxyhemoglobin: 3.1 % — ABNORMAL HIGH (ref 0.5–1.5)
Methemoglobin: 1.7 % — ABNORMAL HIGH (ref 0.0–1.5)
O2 Saturation: 57.8 %
Total hemoglobin: 7.3 g/dL — ABNORMAL LOW (ref 12.0–16.0)

## 2019-09-17 LAB — CBC WITH DIFFERENTIAL/PLATELET
Abs Immature Granulocytes: 42.39 10*3/uL — ABNORMAL HIGH (ref 0.00–0.07)
Basophils Absolute: 2.3 10*3/uL — ABNORMAL HIGH (ref 0.0–0.1)
Basophils Relative: 1 %
Eosinophils Absolute: 0 10*3/uL (ref 0.0–0.5)
Eosinophils Relative: 0 %
HCT: 22.7 % — ABNORMAL LOW (ref 36.0–46.0)
Hemoglobin: 7.3 g/dL — ABNORMAL LOW (ref 12.0–15.0)
Immature Granulocytes: 23 %
Lymphocytes Relative: 1 %
Lymphs Abs: 1.8 10*3/uL (ref 0.7–4.0)
MCH: 29 pg (ref 26.0–34.0)
MCHC: 32.2 g/dL (ref 30.0–36.0)
MCV: 90.1 fL (ref 80.0–100.0)
Monocytes Absolute: 67 10*3/uL — ABNORMAL HIGH (ref 0.1–1.0)
Monocytes Relative: 36 %
Neutro Abs: 74.1 10*3/uL — ABNORMAL HIGH (ref 1.7–7.7)
Neutrophils Relative %: 39 %
Platelets: 26 10*3/uL — CL (ref 150–400)
RBC: 2.52 MIL/uL — ABNORMAL LOW (ref 3.87–5.11)
RDW: 25.2 % — ABNORMAL HIGH (ref 11.5–15.5)
WBC: 187.6 10*3/uL (ref 4.0–10.5)
nRBC: 9.6 % — ABNORMAL HIGH (ref 0.0–0.2)

## 2019-09-17 LAB — BPAM PLATELET PHERESIS
Blood Product Expiration Date: 202101052359
ISSUE DATE / TIME: 202101031325
Unit Type and Rh: 6200

## 2019-09-17 LAB — CBC
HCT: 22.9 % — ABNORMAL LOW (ref 36.0–46.0)
Hemoglobin: 7.3 g/dL — ABNORMAL LOW (ref 12.0–15.0)
MCH: 29.1 pg (ref 26.0–34.0)
MCHC: 31.9 g/dL (ref 30.0–36.0)
MCV: 91.2 fL (ref 80.0–100.0)
Platelets: 20 10*3/uL — CL (ref 150–400)
RBC: 2.51 MIL/uL — ABNORMAL LOW (ref 3.87–5.11)
RDW: 25.2 % — ABNORMAL HIGH (ref 11.5–15.5)
WBC: 176.8 10*3/uL (ref 4.0–10.5)
nRBC: 10.1 % — ABNORMAL HIGH (ref 0.0–0.2)

## 2019-09-17 LAB — PHOSPHORUS: Phosphorus: 3.5 mg/dL (ref 2.5–4.6)

## 2019-09-17 LAB — MAGNESIUM: Magnesium: 2.6 mg/dL — ABNORMAL HIGH (ref 1.7–2.4)

## 2019-09-17 MED ORDER — NOREPINEPHRINE 4 MG/250ML-% IV SOLN: 0.0000 ug/min | INTRAVENOUS | Status: DC

## 2019-09-17 MED ORDER — NOREPINEPHRINE 4 MG/250ML-% IV SOLN
INTRAVENOUS | Status: AC
  Filled 2019-09-17: qty 250

## 2019-09-17 MED ORDER — FUROSEMIDE 10 MG/ML IJ SOLN
40.0000 mg | Freq: Two times a day (BID) | INTRAMUSCULAR | Status: DC
  Administered 2019-09-17: 40 mg via INTRAVENOUS
  Filled 2019-09-17: qty 4

## 2019-09-17 MED ORDER — PHENYLEPHRINE CONCENTRATED 100MG/250ML (0.4 MG/ML) INFUSION SIMPLE
0.0000 ug/min | INTRAVENOUS | Status: DC
  Administered 2019-09-17: 30 ug/min via INTRAVENOUS
  Filled 2019-09-17: qty 250

## 2019-09-18 LAB — PATHOLOGIST SMEAR REVIEW

## 2019-09-25 ENCOUNTER — Encounter: Payer: Medicare Other | Admitting: Internal Medicine

## 2019-09-26 ENCOUNTER — Ambulatory Visit: Payer: Medicare Other | Admitting: Physician Assistant

## 2019-09-27 ENCOUNTER — Other Ambulatory Visit: Payer: Medicare Other

## 2019-09-27 ENCOUNTER — Ambulatory Visit: Payer: Medicare Other

## 2019-09-27 ENCOUNTER — Ambulatory Visit: Payer: Medicare Other | Admitting: Hematology

## 2019-09-29 ENCOUNTER — Other Ambulatory Visit: Payer: Self-pay | Admitting: Internal Medicine

## 2019-10-11 ENCOUNTER — Ambulatory Visit: Payer: Medicare Other | Admitting: Hematology

## 2019-10-11 ENCOUNTER — Other Ambulatory Visit: Payer: Medicare Other

## 2019-10-15 NOTE — Progress Notes (Signed)
Pt still c/o cp and shortness of breath. Notified CCM, new orders for repeat EKG and Troponin.

## 2019-10-15 NOTE — Progress Notes (Signed)
PCCM progress note  Called emergently to the bedside for, bradycardia, unresponsiveness Patient was already on neo and started on nor epi at full dose with no response She then became agonal, cyanotic After preparing to intubate she went into PEA arrest  As per her previously expressed wishes for no CPR she was not resuscitated and passed away at 5:00 pm Her health care power of attorney was called on the phone and informed about the events.  The patient is critically ill with multiple organ system failure and requires high complexity decision making for assessment and support, frequent evaluation and titration of therapies, advanced monitoring, review of radiographic studies and interpretation of complex data.   Critical Care Time devoted to patient care services, exclusive of separately billable procedures, described in this note is 35 minutes.   Marshell Garfinkel MD [facility] Please see [email or web address] for pager details.  10/14/19, 5:13 PM

## 2019-10-15 NOTE — Progress Notes (Signed)
Spoke with dianne cooper POA who wanted [place] to give [name] a local friend who has been taking care of pt house pt car keys. Car keys were given to pt friend [name].

## 2019-10-15 NOTE — Progress Notes (Signed)
Spoke with pt [name] about pts passing after multi. She was tearful and stated "I figured that's what was happening".

## 2019-10-15 NOTE — Progress Notes (Signed)
Around 1615 pt became lethargic and stated that she felt like she was going to pass out. CCM was notified and came to bedside. Pt BP also became more hypotensive and Levophed was added to try and help with BP. Pt partial code except intubation. Pt POA called and MD explained to POA pt situation. Pt then became unresponsive and CCM attempted to intubate but then pt went into PEA arrest. Time of death was called at 49 by CCM MD.

## 2019-10-15 NOTE — Progress Notes (Addendum)
NAME:  [name], MRN:  [id number], DOB:  [birth year], LOS: 5 ADMISSION DATE:  09/06/2019, CONSULTATION DATE:  08/31/2019 REFERRING MD: Raelyn Number, CHIEF COMPLAINT:  Hypotension   Brief History   74 yo female sent to ER from Cardiology clinic with hypotension felt secondary to meds including caredilol,  Entresto , lasix and spironalactone.  She also has had some diarrhea.  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  IN the emergency room her systolic is 70 and she is started on levophed.  Most recent K is 5.1, 6.8 at presentation.  Cr 1.93, baseline < 1.  AG is 11, BNP is 307, troponin 5. She also has myelodysplastic syndrome and now hs a wbc  CT 64 with neutrophil predominance hgb of 8.5.  On evaluation she really has no complaints.   Patient's COVID test is positive and was admitted to 14M  Past Medical History    has a past medical history of Automatic implantable cardioverter-defibrillator in situ, Cataract, Diverticulosis, Dyslipidemia, Ejection fraction < 50%, GERD (gastroesophageal reflux disease), Hypothyroidism, ICD (implantable cardiac defibrillator) in place, Iron deficiency (09/21/2018), LBBB (left bundle branch block), Macrocytic anemia, MDS (myelodysplastic syndrome) ([place]) (12/27/2018), Mitral regurgitation, Nonischemic cardiomyopathy ([place]), Overweight(278.02), Serrated polyp of colon, and Systolic CHF, chronic ([place]).   has a past surgical history that includes Tonsillectomy (1952); Cardiac defibrillator placement; heart catherization; Cardiac catheterization; Eye surgery (Left); Colonoscopy (N/A, 11/29/2013); Colonoscopy (N/A, 02/25/2015); Esophagogastroduodenoscopy (egd) with propofol (N/A, 08/07/2018); and biopsy (08/07/2018).   Significant Hospital Events   Admission 08/22/2019 [facility]  12/30 -  on some levophed. Reprots diarrhea x 2 weeks. No clustering risk for covid. Was covid + 07/06/2019 and positive again now. Only new interim hx is diarrhea. D/w  Dr Acie Fredrickson - patient has  NICM. Clinical story sounds like dehdyration. Patient says normal sbp > 105. Curerntly feels well. No acute covi symptoms other than diarrha. CT abd suggestive of splenic flexure area diverituculits. AKi better. K better but still high Mag low  12/31 status post platelet transfusion for platelet count 13,000  1./1 - - stil with diarrhea. PCR sent. Creat better but K high and improved on repeat. Got fluid ysteday. Came off levophed but bp low again and back on levophed this morning. On room air. On unasyn for splenic flexure diveritculitis. Lovenox on hold due to MDS related low platelet.  Poor platelet response.  1./2- -acute kidney injury has rib resolved.  Platelet continues to be low at 16,000.  PICC line ordered but they want a platelet count greater than 50,000.  WBC count continues to climb now greater than 144,000.  She has edema diffusely.  She is now hypoxemic at 1-2 L of oxygen.  She is feeling fine otherwise.  Diarrhea still present but improved.  Stool PCR still pending though C. difficile is negative.  We discussed goals of care and CODE STATUS: She agrees to no CPR because she does not want to be a vegetable.  Consults:  Cardiology  Procedures:  Rt arm PICC 1/3 >>  Significant Diagnostic Tests:  NA  Micro Data:  10/23 - covid + 12/29 - cofid + with diarrha 12/30 MRSA PCR - positive 12/31- stool pcr >> (cdiff negatve)   Antimicrobials:  unasync 12/30-  Cefepime 12/29 - 12/30 vanc 12/29 - 1/1 Flagyl 12/29 - 12/29   Interim history/subjective:  Complains of chest pain today morning.  EKG, troponins obtained At the time of my examination she states that her pain is improving.  Objective  Blood pressure 92/77, pulse (!) 118, temperature 99.1 F (37.3 C), temperature source Oral, resp. rate 18, height 4\' 11"  (1.499 m), weight 90.7 kg, SpO2 98 %.        Intake/Output Summary (Last 24 hours) at September 20, 2019 0813 Last data filed at 09-20-19 0600 Gross per 24 hour    Intake 2086.9 ml  Output 250 ml  Net 1836.9 ml   Filed Weights   09/15/19 0500 09/16/19 0500 09-20-19 0428  Weight: 90.6 kg 89.3 kg 90.7 kg   Gen:      No acute distress, chronically ill-appearing HEENT:  EOMI, sclera anicteric Neck:     No masses; no thyromegaly Lungs:    Clear to auscultation bilaterally; normal respiratory effort CV:         Regular rate and rhythm; no murmurs Abd:      + bowel sounds; soft, non-tender; no palpable masses, no distension Ext:    2+ edema; adequate peripheral perfusion Skin:      Warm and dry; no rash Neuro: Awake, resposnive   Resolved Hospital Problem list   NA  Assessment & Plan:  Hypertension, shock.  Initially thought to be secondary to dehydration but appears volume overloaded now after fluid resuscitation Off levo and currently on neo Wean down pressors as tolerated. Holding cardiac medications  Diarrhea, possible diverticulitis Continue Unasyn  COVID-19 infection Suspect she has re infection presenting as diarrhea Respiratory status is stable.  Continue monitoring We will not use remdesivir or anti-inflammatory therapy at present  Cardiomyopathy, chronic heart failure, EF 25% Appears massively volume overloaded We will check CVP and SCV O2.  Will likely need Lasix for diuresis  CODE STATUS DNR per discussion with MD on 1/3.  Best practice:  Diet: NPO Pain/Anxiety/Delirium protocol (if indicated): NA VAP protocol (if indicated): NA DVT prophylaxis: scd GI prophylaxis: pepcid Glucose control: monitor Mobility: bedrest -> OO Code Status:  DNR Family Communication: Patient updated 1/4 Disposition: ICU  LABS    PULMONARY No results for input(s): PHART, PCO2ART, PO2ART, HCO3, TCO2, O2SAT in the last 168 hours.  Invalid input(s): PCO2, PO2  CBC Recent Labs  Lab 09/16/19 1638 09/16/19 2336 2019/09/20 0459  HGB 7.5* 7.3* 7.3*  HCT 23.5* 22.9* 22.7*  WBC 172.7* 176.8* 187.6*  PLT 29* 20* 26*     COAGULATION Recent Labs  Lab 08/27/2019 2016 09/16/19 1638  INR 1.9* 1.9*    CARDIAC  No results for input(s): TROPONINI in the last 168 hours. No results for input(s): PROBNP in the last 168 hours.   CHEMISTRY Recent Labs  Lab 09/12/19 0406 09/12/19 2000 09/12/19 2011 09/13/19 0500 09/14/19 1249 09/16/19 1638 09/20/19 0459  NA 139 140  --  138 143 138  --   K 5.6* 5.2*  --  >7.5* 6.2* 3.6  --   CL 110 115*  --  115* 115* 104  --   CO2 16* 18*  --  15* 19* 26  --   GLUCOSE 161* 146*  --  111* 82 96  --   BUN 55* 44*  --  37* 22 16  --   CREATININE 1.51* 1.24*  --  1.28* 0.93 0.58  --   CALCIUM 6.6* 6.5*  --  6.4* 6.8* 7.4*  --   MG 1.6*  --  2.2  --   --  1.6* 2.6*  PHOS 5.7*  --   --   --   --  1.9* 3.5   Estimated Creatinine Clearance: 61.5 mL/min (by C-G  formula based on SCr of 0.58 mg/dL).   LIVER Recent Labs  Lab 09/05/2019 1702 09/07/2019 2016 09/13/19 0500 09/14/19 1249 09/16/19 1638  AST 50*  --  62* 44* 42*  ALT 36  --  35 32 33  ALKPHOS 73  --  87 97 112  BILITOT 1.8*  --  1.6* 1.1 2.1*  PROT 4.2*  --  3.8* 4.1* 4.5*  ALBUMIN 2.5*  --  2.2* 2.3* 2.4*  INR  --  1.9*  --   --  1.9*     INFECTIOUS Recent Labs  Lab 08/23/2019 1714 09/10/2019 2016  LATICACIDVEN 1.1 1.0     ENDOCRINE CBG (last 3)  Recent Labs    09/14/19 2327  GLUCAP 123*    The patient is critically ill with multiple organ system failure and requires high complexity decision making for assessment and support, frequent evaluation and titration of therapies, advanced monitoring, review of radiographic studies and interpretation of complex data.   Critical Care Time devoted to patient care services, exclusive of separately billable procedures, described in this note is 45 minutes.   Marshell Garfinkel MD [facility] Please see [email or web address] for pager details.  Sep 30, 2019, 8:13 AM

## 2019-10-15 NOTE — Discharge Summary (Signed)
Physician Death Summary  Patient ID: ARIYA MONCRIEF MRN: [id number] DOB/AGE: 74-Jan-1947 74 y.o.  Admit date: 08/20/2019 Discharge date: 10/10/2019  Admission Diagnoses: Shock Hypotension  Discharge Diagnoses:  Cardiac arrest Cardiogenic shock Septic shock  Diverticulitis Diarrhea Covid 19 infection  Discharged Condition: Deceased  Hospital Course:  74 yo female sent to ER from Cardiology clinic with hypotension felt secondary to meds including caredilol,  Entresto , lasix and spironalactone.  She also has had some diarrhea.  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  IN the emergency room her systolic is 70 and she is started on levophed.  Most recent K is 5.1, 6.8 at presentation.  Cr 1.93, baseline < 1.  AG is 11, BNP is 307, troponin 5. She also has myelodysplastic syndrome and now hs a wbc  CT 64 with neutrophil predominance hgb of 8.5.  On evaluation she really has no complaints.   Patient's COVID test is positive and was admitted to ICU  Placed on pressors and resuscitated, started on Unasyn for diverticulitis.  After discussion with patient she was made limited code with no CPR The afternoon of 1/4 she declined with worsening shock requiring multiple pressors.  As we were preparing to intubate she went into PEA arrest. As per her previously expressed wishes for no CPR she was not resuscitated and passed away at 5:00 pm  Marshell Garfinkel MD [facility] Please see [email or web address] for pager details.  Oct 10, 2019, 5:21 PM

## 2019-10-15 DEATH — deceased

## 2020-01-28 DIAGNOSIS — Z029 Encounter for administrative examinations, unspecified: Secondary | ICD-10-CM

## 2020-01-29 DIAGNOSIS — Z029 Encounter for administrative examinations, unspecified: Secondary | ICD-10-CM

## 2020-05-15 IMAGING — CT CT BONE MARROW BIOPSY AND ASPIRATION
1 of 2 series · 15 of 27 positions shown, 19 images · non-contrast
Comparison: none

CLINICAL DATA: Macrocytic and iron deficiency anemia and need for
bone marrow biopsy.

[Series 2: i-spiral 5.0 b40f · axial · 0.98mm/px · z∈[+1040,+1104]mm · 15 of 21 slices shown, 19 images]
[im 2/21  mediastinal]
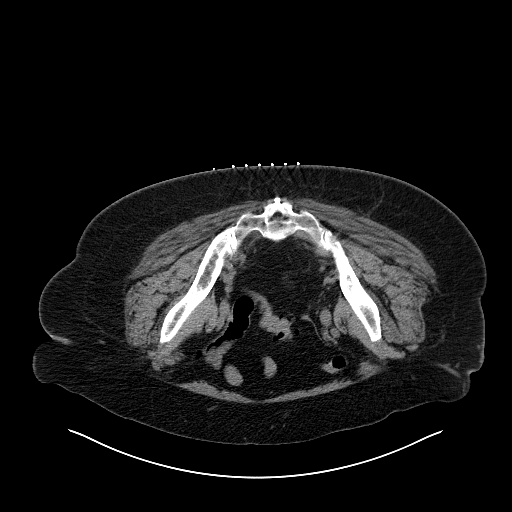
[im 2/21  lung]
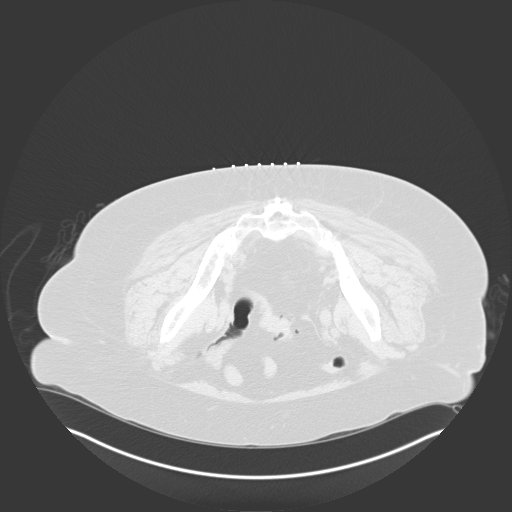
[im 3/21  lung]
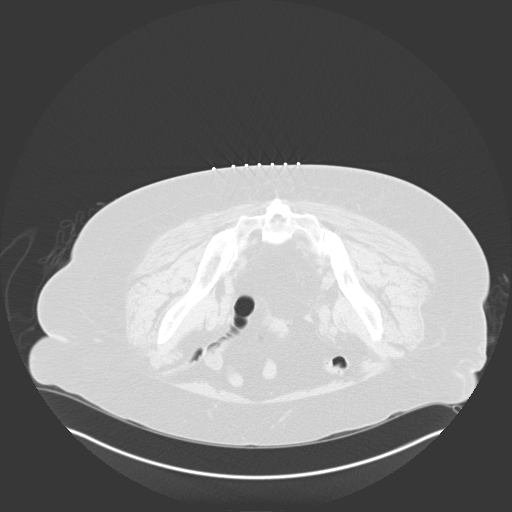
[im 5/21  lung]
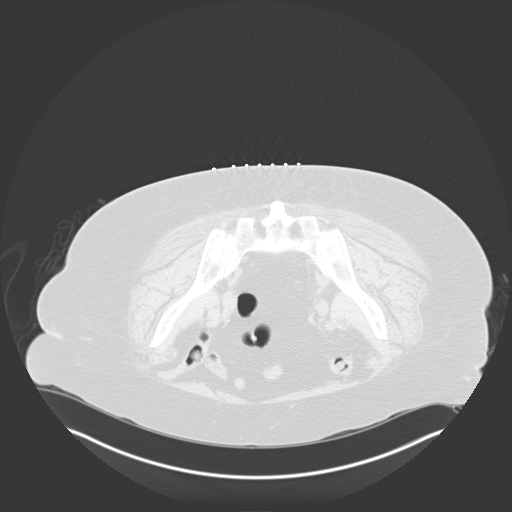
[im 6/21  lung]
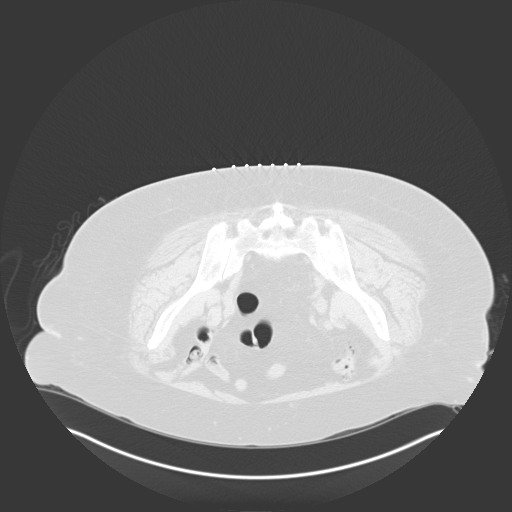
[im 7/21  mediastinal]
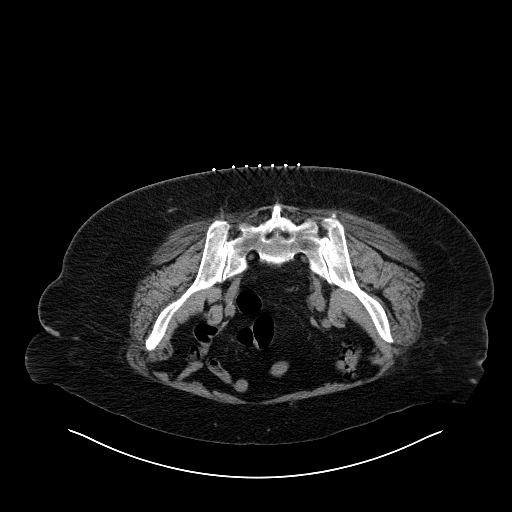
[im 7/21  lung]
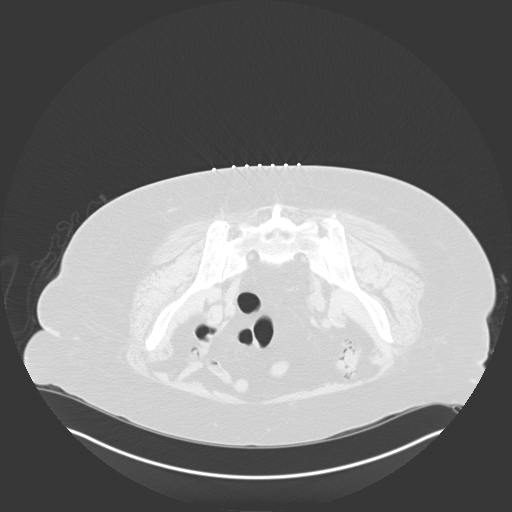
[im 8/21  lung]
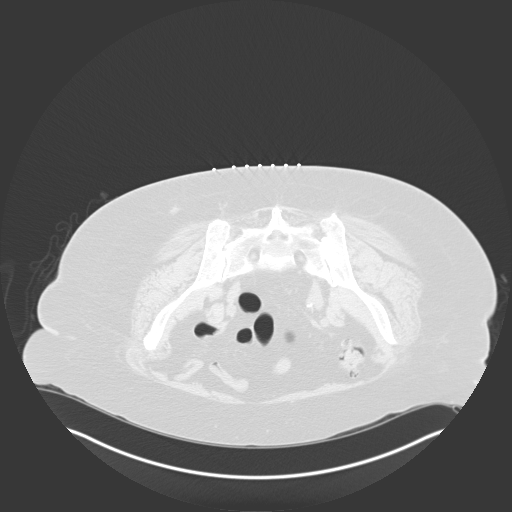
[im 10/21  lung]
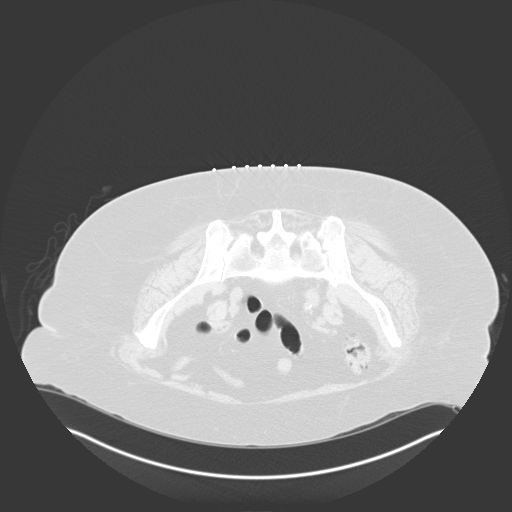
[im 11/21  lung]
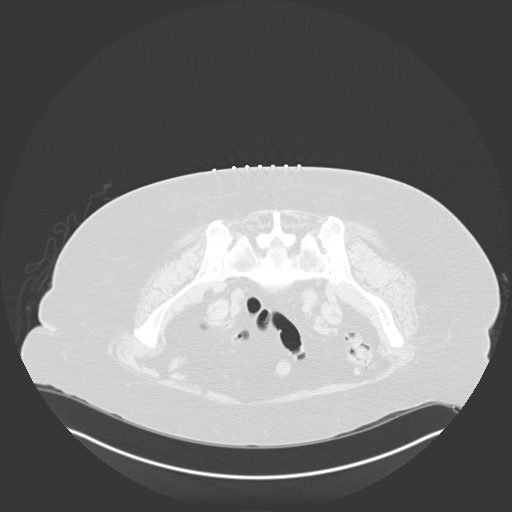
[im 12/21  mediastinal]
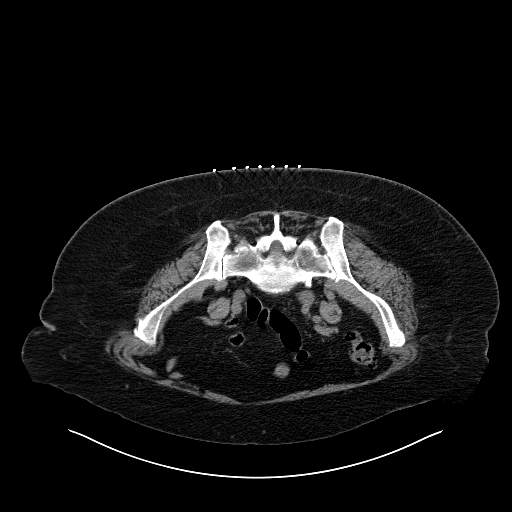
[im 12/21  lung]
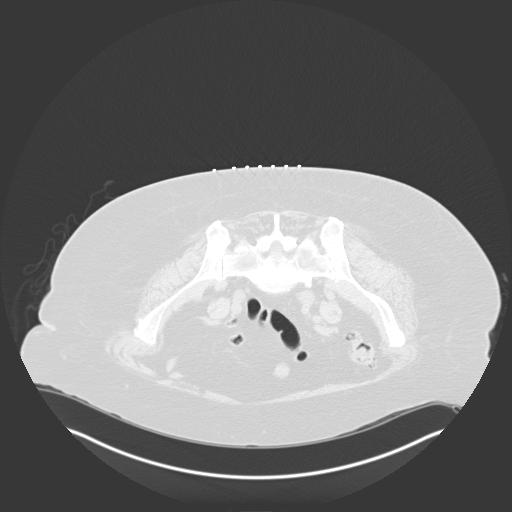
[im 14/21  lung]
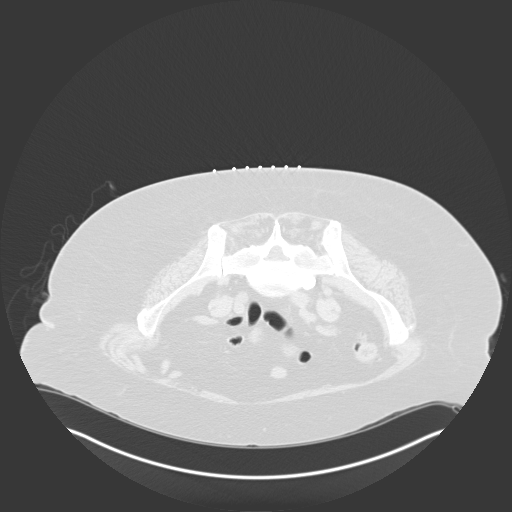
[im 15/21  lung]
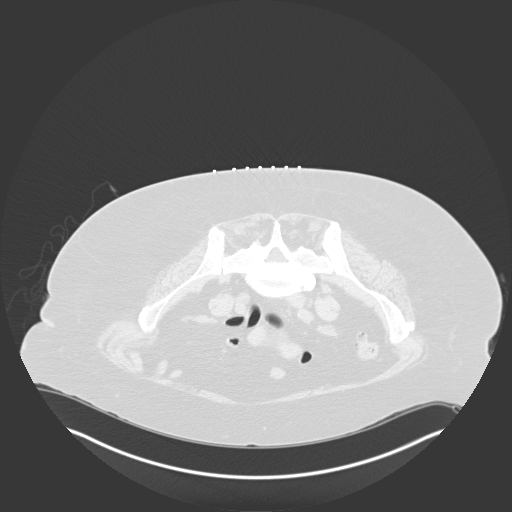
[im 16/21  lung]
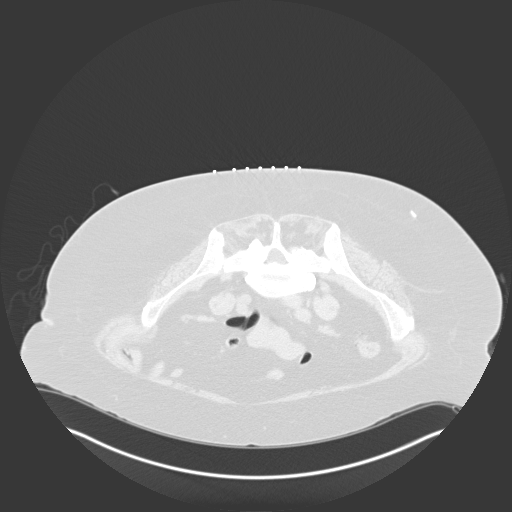
[im 17/21  mediastinal]
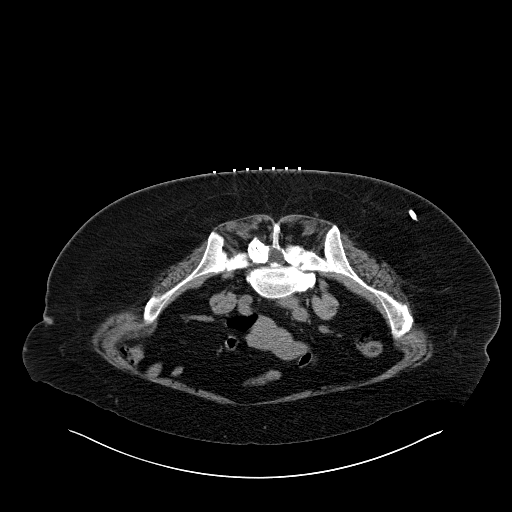
[im 17/21  lung]
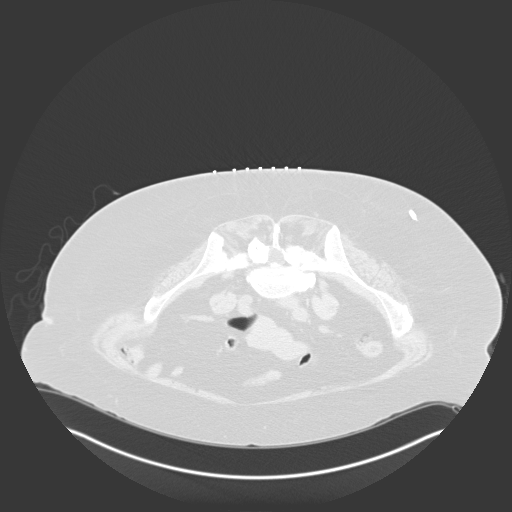
[im 19/21  lung]
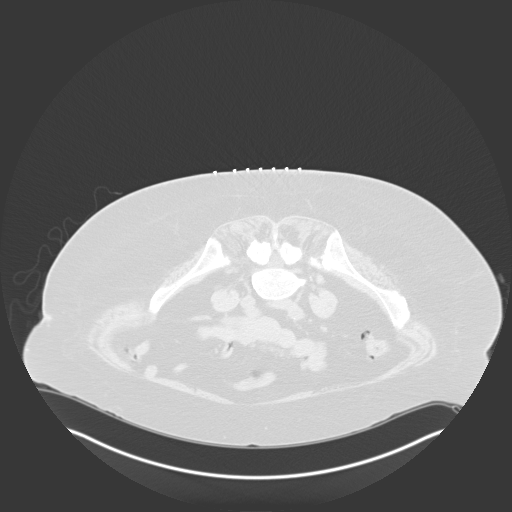
[im 20/21  lung]
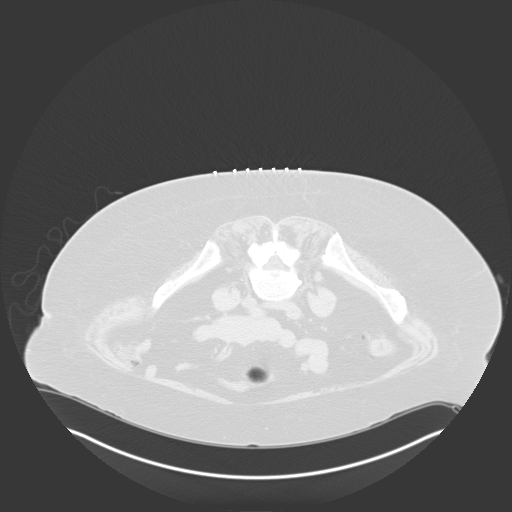

[15 of 27 positions shown; findings below may reference images not displayed]

EXAM:
CT GUIDED BONE MARROW ASPIRATION AND BIOPSY

ANESTHESIA/SEDATION:
Versed 1.5 mg IV, Fentanyl 62.5 mcg IV

Total Moderate Sedation Time:   17 minutes.

The patient's level of consciousness and physiologic status were
continuously monitored during the procedure by Radiology nursing.

PROCEDURE:
The procedure risks, benefits, and alternatives were explained to
the patient. Questions regarding the procedure were encouraged and
answered. The patient understands and consents to the procedure. A
time out was performed prior to initiating the procedure.

The right gluteal region was prepped with chlorhexidine. Sterile
gown and sterile gloves were used for the procedure. Local
anesthesia was provided with 1% Lidocaine.

Under CT guidance, an 11 gauge On Control bone cutting needle was
advanced from a posterior approach into the right iliac bone. Needle
positioning was confirmed with CT. Initial non heparinized and
heparinized aspirate samples were obtained of bone marrow. Core
biopsy was performed via the On Control drill needle.

COMPLICATIONS:
None
FINDINGS: Inspection of initial aspirate did reveal visible particles. Intact
core biopsy sample was obtained.
IMPRESSION: CT guided bone marrow biopsy of right posterior iliac bone with both
aspirate and core samples obtained.

## 2020-06-22 IMAGING — CR CHEST - 2 VIEW
2 series · 2 of 2 positions shown · non-contrast
Comparison: August 07, 2018

CLINICAL DATA: Shortness of breath and fatigue. History of
myelodysplasia

EXAM:
CHEST - 2 VIEW

[w chest pa]
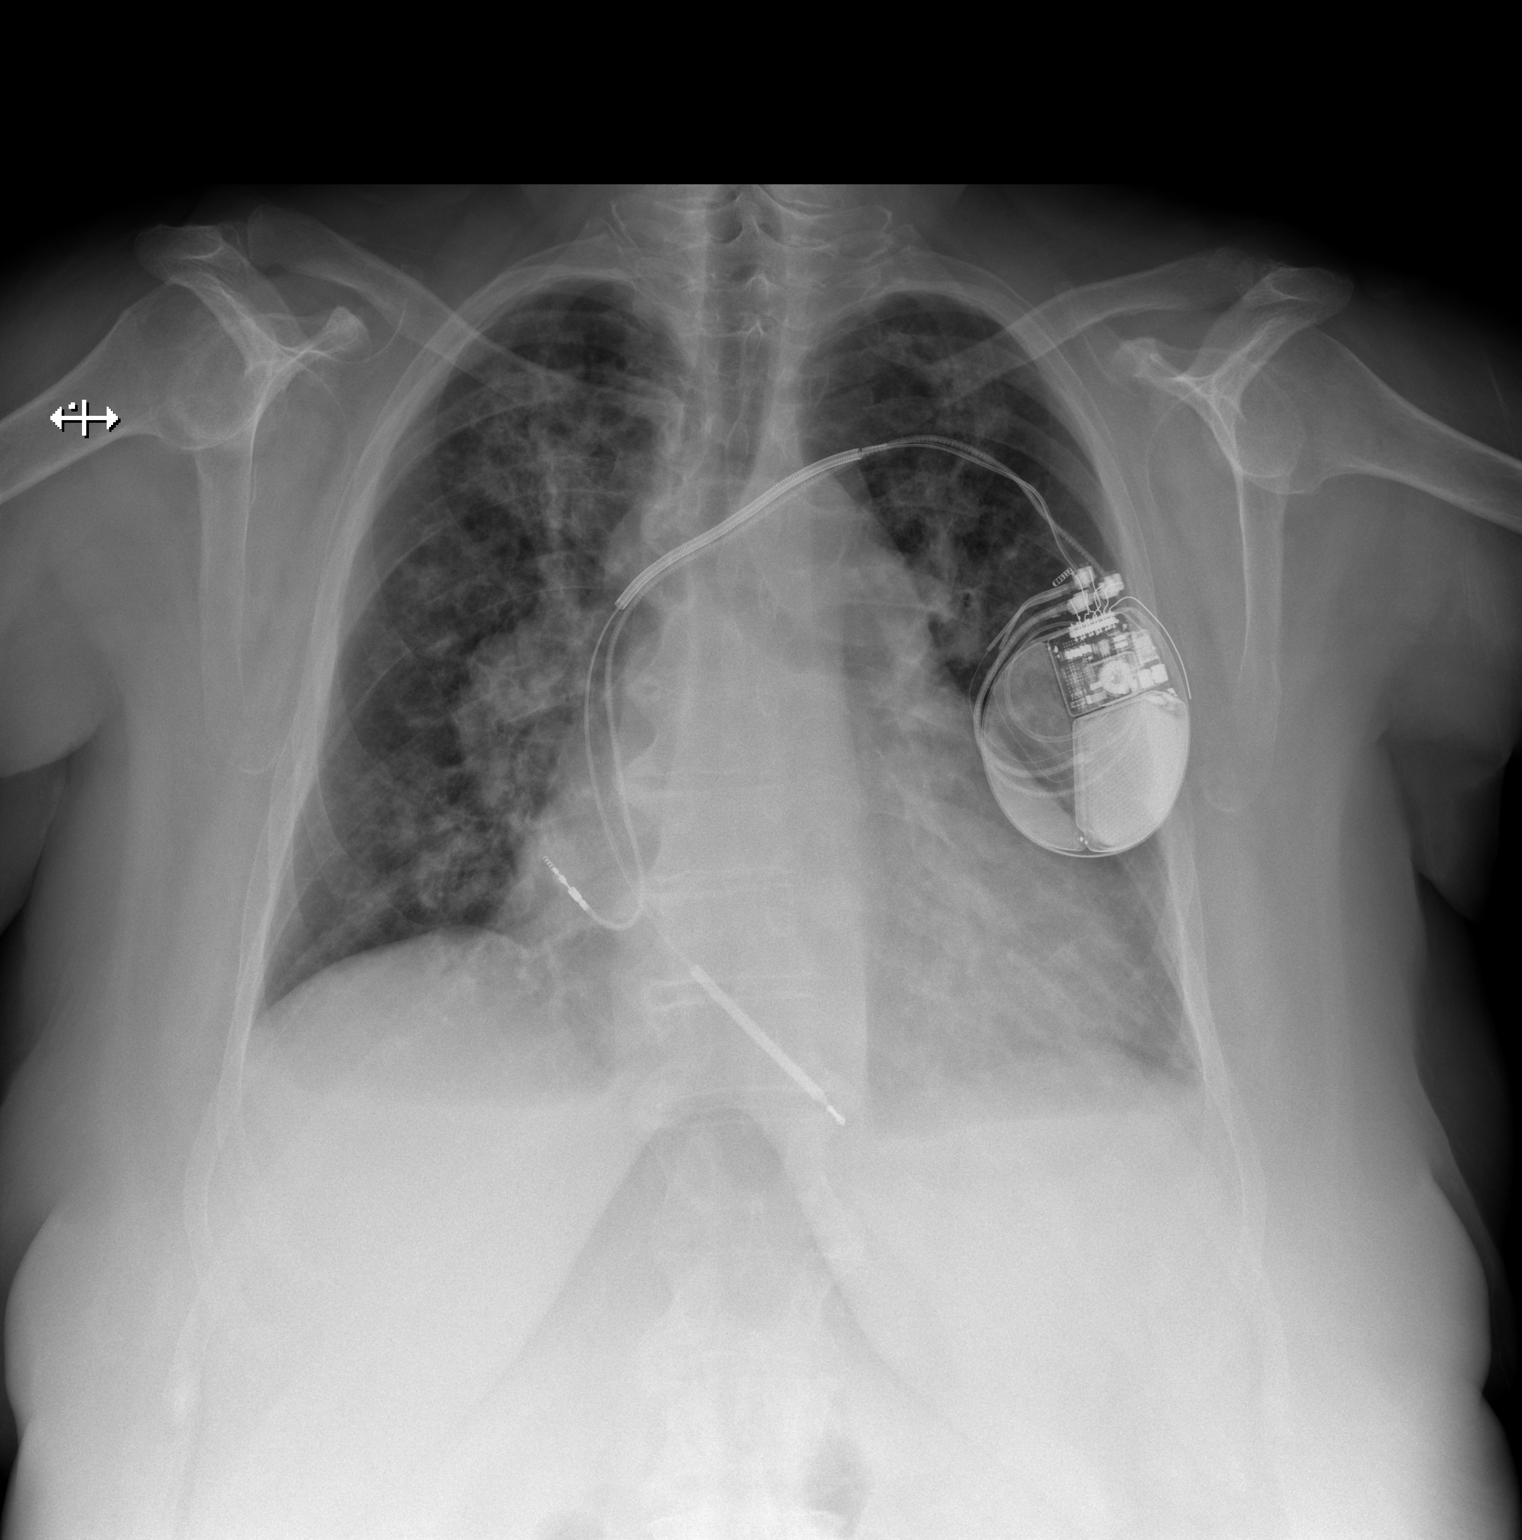

[w chest lat]
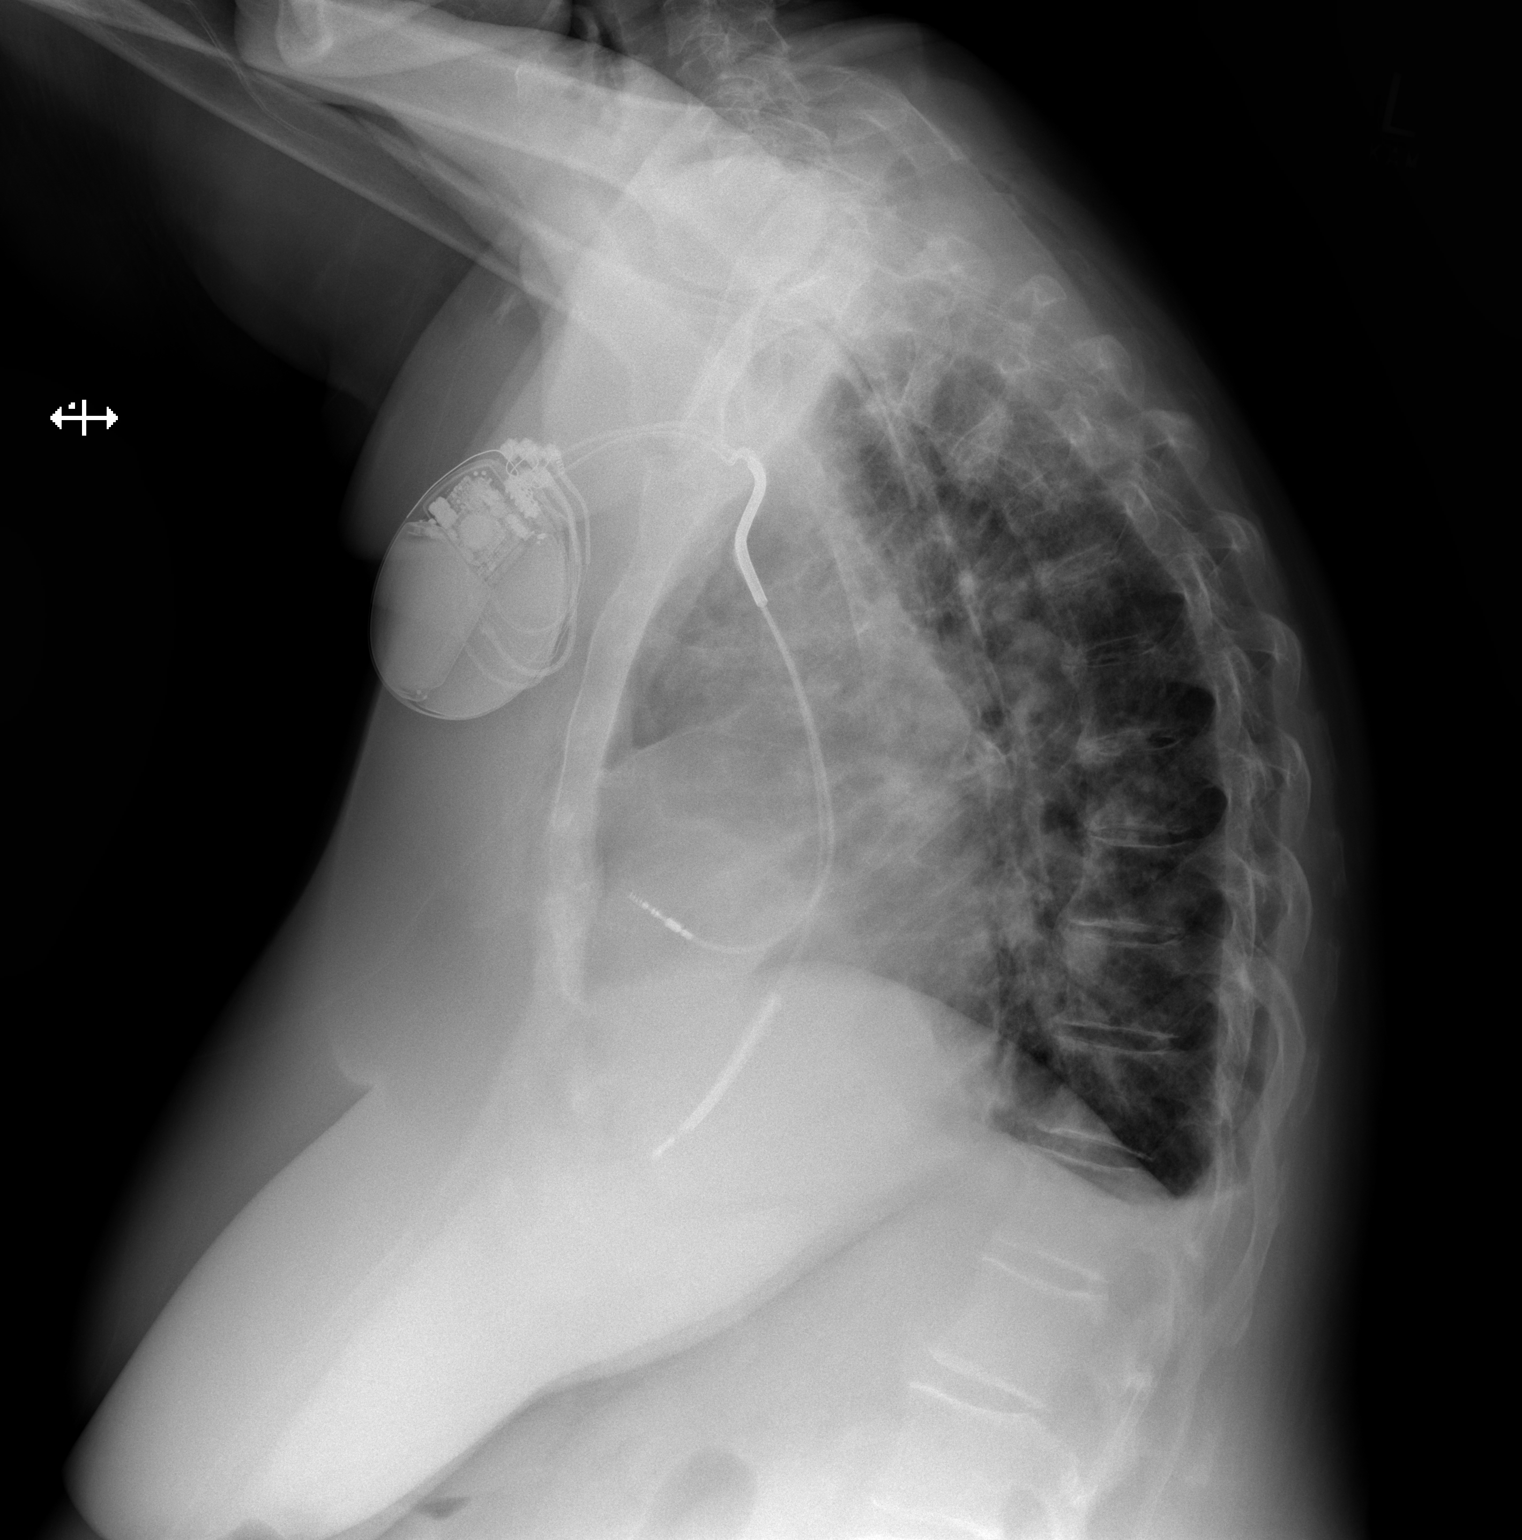

[2 of 2 positions shown; findings below may reference images not displayed]

FINDINGS: There is patchy airspace consolidation in both upper lobes. There is
also more subtle airspace consolidation in the right lower lobe.

Heart is enlarged with pulmonary venous hypertension. Pacemaker
leads are attached to the right atrium and right ventricle. No
adenopathy. No bone lesions.
IMPRESSION: Multifocal airspace consolidation. Suspect multifocal pneumonia.
Atypical organism pneumonia a differential consideration.

There is pulmonary vascular congestion. No appreciable interstitial
edema. Pacemaker leads attached to right atrium and right ventricle.

These results will be called to the ordering clinician or
representative by the Radiologist Assistant, and communication
documented in the PACS or zVision Dashboard.
